# Patient Record
Sex: Female | Born: 1935 | Race: White | Hispanic: No | Marital: Married | Smoking: Never smoker
Health system: Southern US, Community
[De-identification: ages and names within clinical notes are randomized; demographics above are authoritative.]

## PROBLEM LIST (undated history)

## (undated) DIAGNOSIS — Z8719 Personal history of other diseases of the digestive system: Secondary | ICD-10-CM

## (undated) DIAGNOSIS — F3289 Other specified depressive episodes: Secondary | ICD-10-CM

## (undated) DIAGNOSIS — G4700 Insomnia, unspecified: Secondary | ICD-10-CM

## (undated) DIAGNOSIS — M5430 Sciatica, unspecified side: Secondary | ICD-10-CM

## (undated) DIAGNOSIS — R7989 Other specified abnormal findings of blood chemistry: Secondary | ICD-10-CM

## (undated) DIAGNOSIS — M545 Low back pain, unspecified: Secondary | ICD-10-CM

## (undated) DIAGNOSIS — K5900 Constipation, unspecified: Secondary | ICD-10-CM

## (undated) DIAGNOSIS — K5730 Diverticulosis of large intestine without perforation or abscess without bleeding: Secondary | ICD-10-CM

## (undated) DIAGNOSIS — M25549 Pain in joints of unspecified hand: Secondary | ICD-10-CM

## (undated) DIAGNOSIS — N951 Menopausal and female climacteric states: Secondary | ICD-10-CM

## (undated) DIAGNOSIS — R748 Abnormal levels of other serum enzymes: Secondary | ICD-10-CM

## (undated) DIAGNOSIS — M25579 Pain in unspecified ankle and joints of unspecified foot: Secondary | ICD-10-CM

## (undated) DIAGNOSIS — R1319 Other dysphagia: Secondary | ICD-10-CM

## (undated) DIAGNOSIS — M1990 Unspecified osteoarthritis, unspecified site: Secondary | ICD-10-CM

## (undated) DIAGNOSIS — K759 Inflammatory liver disease, unspecified: Secondary | ICD-10-CM

## (undated) DIAGNOSIS — K222 Esophageal obstruction: Secondary | ICD-10-CM

## (undated) DIAGNOSIS — R252 Cramp and spasm: Secondary | ICD-10-CM

## (undated) DIAGNOSIS — M76899 Other specified enthesopathies of unspecified lower limb, excluding foot: Secondary | ICD-10-CM

## (undated) DIAGNOSIS — I4891 Unspecified atrial fibrillation: Secondary | ICD-10-CM

## (undated) DIAGNOSIS — F411 Generalized anxiety disorder: Secondary | ICD-10-CM

## (undated) DIAGNOSIS — R5381 Other malaise: Secondary | ICD-10-CM

## (undated) DIAGNOSIS — Z87442 Personal history of urinary calculi: Secondary | ICD-10-CM

## (undated) DIAGNOSIS — I499 Cardiac arrhythmia, unspecified: Secondary | ICD-10-CM

## (undated) DIAGNOSIS — IMO0001 Reserved for inherently not codable concepts without codable children: Secondary | ICD-10-CM

## (undated) DIAGNOSIS — M79609 Pain in unspecified limb: Secondary | ICD-10-CM

## (undated) DIAGNOSIS — R5383 Other fatigue: Secondary | ICD-10-CM

## (undated) DIAGNOSIS — M722 Plantar fascial fibromatosis: Secondary | ICD-10-CM

## (undated) DIAGNOSIS — M25569 Pain in unspecified knee: Secondary | ICD-10-CM

## (undated) DIAGNOSIS — I10 Essential (primary) hypertension: Secondary | ICD-10-CM

## (undated) DIAGNOSIS — I498 Other specified cardiac arrhythmias: Secondary | ICD-10-CM

## (undated) DIAGNOSIS — G43909 Migraine, unspecified, not intractable, without status migrainosus: Secondary | ICD-10-CM

## (undated) DIAGNOSIS — Z950 Presence of cardiac pacemaker: Secondary | ICD-10-CM

## (undated) DIAGNOSIS — M25559 Pain in unspecified hip: Secondary | ICD-10-CM

## (undated) DIAGNOSIS — K449 Diaphragmatic hernia without obstruction or gangrene: Secondary | ICD-10-CM

## (undated) DIAGNOSIS — M4800 Spinal stenosis, site unspecified: Secondary | ICD-10-CM

## (undated) DIAGNOSIS — E559 Vitamin D deficiency, unspecified: Secondary | ICD-10-CM

## (undated) DIAGNOSIS — E669 Obesity, unspecified: Secondary | ICD-10-CM

## (undated) DIAGNOSIS — J301 Allergic rhinitis due to pollen: Secondary | ICD-10-CM

## (undated) DIAGNOSIS — K2100 Gastro-esophageal reflux disease with esophagitis, without bleeding: Secondary | ICD-10-CM

## (undated) DIAGNOSIS — R209 Unspecified disturbances of skin sensation: Secondary | ICD-10-CM

## (undated) DIAGNOSIS — I8390 Asymptomatic varicose veins of unspecified lower extremity: Secondary | ICD-10-CM

## (undated) DIAGNOSIS — K219 Gastro-esophageal reflux disease without esophagitis: Secondary | ICD-10-CM

## (undated) DIAGNOSIS — G5600 Carpal tunnel syndrome, unspecified upper limb: Secondary | ICD-10-CM

## (undated) DIAGNOSIS — F329 Major depressive disorder, single episode, unspecified: Secondary | ICD-10-CM

## (undated) DIAGNOSIS — G8929 Other chronic pain: Secondary | ICD-10-CM

## (undated) DIAGNOSIS — Z9289 Personal history of other medical treatment: Secondary | ICD-10-CM

## (undated) DIAGNOSIS — R079 Chest pain, unspecified: Secondary | ICD-10-CM

## (undated) HISTORY — DX: Other dysphagia: R13.19

## (undated) HISTORY — DX: Plantar fascial fibromatosis: M72.2

## (undated) HISTORY — PX: JOINT REPLACEMENT: SHX530

## (undated) HISTORY — DX: Inflammatory liver disease, unspecified: K75.9

## (undated) HISTORY — DX: Pain in unspecified hip: M25.559

## (undated) HISTORY — DX: Diverticulosis of large intestine without perforation or abscess without bleeding: K57.30

## (undated) HISTORY — PX: SKIN SURGERY: SHX2413

## (undated) HISTORY — PX: ESOPHAGOGASTRODUODENOSCOPY (EGD) WITH ESOPHAGEAL DILATION: SHX5812

## (undated) HISTORY — DX: Generalized anxiety disorder: F41.1

## (undated) HISTORY — DX: Unspecified disturbances of skin sensation: R20.9

## (undated) HISTORY — DX: Gastro-esophageal reflux disease with esophagitis: K21.0

## (undated) HISTORY — DX: Major depressive disorder, single episode, unspecified: F32.9

## (undated) HISTORY — DX: Pain in unspecified limb: M79.609

## (undated) HISTORY — DX: Other malaise: R53.81

## (undated) HISTORY — PX: EYE SURGERY: SHX253

## (undated) HISTORY — DX: Vitamin D deficiency, unspecified: E55.9

## (undated) HISTORY — DX: Sciatica, unspecified side: M54.30

## (undated) HISTORY — DX: Obesity, unspecified: E66.9

## (undated) HISTORY — DX: Low back pain, unspecified: M54.50

## (undated) HISTORY — DX: Other specified enthesopathies of unspecified lower limb, excluding foot: M76.899

## (undated) HISTORY — DX: Other specified cardiac arrhythmias: I49.8

## (undated) HISTORY — DX: Pain in unspecified ankle and joints of unspecified foot: M25.579

## (undated) HISTORY — DX: Spinal stenosis, site unspecified: M48.00

## (undated) HISTORY — PX: OTHER SURGICAL HISTORY: SHX169

## (undated) HISTORY — DX: Esophageal obstruction: K22.2

## (undated) HISTORY — DX: Other specified depressive episodes: F32.89

## (undated) HISTORY — DX: Essential (primary) hypertension: I10

## (undated) HISTORY — DX: Presence of cardiac pacemaker: Z95.0

## (undated) HISTORY — DX: Carpal tunnel syndrome, unspecified upper limb: G56.00

## (undated) HISTORY — DX: Other fatigue: R53.83

## (undated) HISTORY — DX: Other specified abnormal findings of blood chemistry: R79.89

## (undated) HISTORY — PX: BREAST BIOPSY: SHX20

## (undated) HISTORY — DX: Pain in joints of unspecified hand: M25.549

## (undated) HISTORY — PX: INSERT / REPLACE / REMOVE PACEMAKER: SUR710

## (undated) HISTORY — DX: Abnormal levels of other serum enzymes: R74.8

## (undated) HISTORY — DX: Chest pain, unspecified: R07.9

## (undated) HISTORY — DX: Cramp and spasm: R25.2

## (undated) HISTORY — DX: Reserved for inherently not codable concepts without codable children: IMO0001

## (undated) HISTORY — PX: TRIGGER FINGER RELEASE: SHX641

## (undated) HISTORY — DX: Gastro-esophageal reflux disease without esophagitis: K21.9

## (undated) HISTORY — DX: Menopausal and female climacteric states: N95.1

## (undated) HISTORY — DX: Pain in unspecified knee: M25.569

## (undated) HISTORY — DX: Allergic rhinitis due to pollen: J30.1

## (undated) HISTORY — DX: Low back pain: M54.5

## (undated) HISTORY — DX: Insomnia, unspecified: G47.00

## (undated) HISTORY — DX: Gastro-esophageal reflux disease with esophagitis, without bleeding: K21.00

## (undated) HISTORY — DX: Diaphragmatic hernia without obstruction or gangrene: K44.9

## (undated) HISTORY — DX: Asymptomatic varicose veins of unspecified lower extremity: I83.90

---

## 1977-12-25 HISTORY — PX: VAGINAL HYSTERECTOMY: SUR661

## 1986-12-25 HISTORY — PX: COLONOSCOPY: SHX174

## 1987-12-26 HISTORY — PX: CHOLECYSTECTOMY OPEN: SUR202

## 1998-12-06 ENCOUNTER — Other Ambulatory Visit: Admission: RE | Admit: 1998-12-06 | Discharge: 1998-12-06 | Payer: Self-pay | Admitting: Gynecology

## 1998-12-25 HISTORY — PX: CATARACT EXTRACTION W/ INTRAOCULAR LENS IMPLANT: SHX1309

## 1999-08-03 HISTORY — PX: CATARACT EXTRACTION W/ INTRAOCULAR LENS IMPLANT: SHX1309

## 2000-01-17 ENCOUNTER — Other Ambulatory Visit: Admission: RE | Admit: 2000-01-17 | Discharge: 2000-01-17 | Payer: Self-pay | Admitting: Gynecology

## 2001-01-10 ENCOUNTER — Ambulatory Visit (HOSPITAL_COMMUNITY): Admission: RE | Admit: 2001-01-10 | Discharge: 2001-01-10 | Payer: Self-pay | Admitting: Internal Medicine

## 2001-02-11 ENCOUNTER — Other Ambulatory Visit: Admission: RE | Admit: 2001-02-11 | Discharge: 2001-02-11 | Payer: Self-pay | Admitting: Gynecology

## 2001-12-25 HISTORY — PX: KNEE ARTHROSCOPY: SHX127

## 2002-02-17 ENCOUNTER — Other Ambulatory Visit: Admission: RE | Admit: 2002-02-17 | Discharge: 2002-02-17 | Payer: Self-pay | Admitting: Gynecology

## 2002-11-13 ENCOUNTER — Ambulatory Visit (HOSPITAL_COMMUNITY): Admission: RE | Admit: 2002-11-13 | Discharge: 2002-11-13 | Payer: Self-pay | Admitting: Internal Medicine

## 2002-11-23 ENCOUNTER — Emergency Department (HOSPITAL_COMMUNITY): Admission: EM | Admit: 2002-11-23 | Discharge: 2002-11-23 | Payer: Self-pay | Admitting: Emergency Medicine

## 2002-11-23 ENCOUNTER — Encounter: Payer: Self-pay | Admitting: Emergency Medicine

## 2003-02-06 ENCOUNTER — Encounter: Payer: Self-pay | Admitting: Gastroenterology

## 2003-03-05 ENCOUNTER — Other Ambulatory Visit: Admission: RE | Admit: 2003-03-05 | Discharge: 2003-03-05 | Payer: Self-pay | Admitting: Gynecology

## 2003-06-11 ENCOUNTER — Ambulatory Visit (HOSPITAL_COMMUNITY): Admission: RE | Admit: 2003-06-11 | Discharge: 2003-06-11 | Payer: Self-pay | Admitting: Gastroenterology

## 2003-06-11 ENCOUNTER — Encounter: Payer: Self-pay | Admitting: Gastroenterology

## 2003-08-20 ENCOUNTER — Ambulatory Visit (HOSPITAL_COMMUNITY): Admission: RE | Admit: 2003-08-20 | Discharge: 2003-08-20 | Payer: Self-pay | Admitting: Internal Medicine

## 2004-04-24 HISTORY — PX: TOTAL KNEE ARTHROPLASTY: SHX125

## 2004-05-16 ENCOUNTER — Inpatient Hospital Stay (HOSPITAL_COMMUNITY): Admission: RE | Admit: 2004-05-16 | Discharge: 2004-05-19 | Payer: Self-pay | Admitting: Orthopedic Surgery

## 2005-07-03 ENCOUNTER — Other Ambulatory Visit: Admission: RE | Admit: 2005-07-03 | Discharge: 2005-07-03 | Payer: Self-pay | Admitting: Gynecology

## 2005-10-03 ENCOUNTER — Ambulatory Visit: Payer: Self-pay | Admitting: Gastroenterology

## 2006-03-13 ENCOUNTER — Ambulatory Visit: Payer: Self-pay | Admitting: Sports Medicine

## 2006-03-13 ENCOUNTER — Encounter: Admission: RE | Admit: 2006-03-13 | Discharge: 2006-03-13 | Payer: Self-pay | Admitting: Sports Medicine

## 2006-03-25 HISTORY — PX: OTHER SURGICAL HISTORY: SHX169

## 2006-04-10 ENCOUNTER — Ambulatory Visit: Payer: Self-pay | Admitting: Sports Medicine

## 2006-05-11 ENCOUNTER — Ambulatory Visit: Payer: Self-pay | Admitting: Sports Medicine

## 2006-07-12 ENCOUNTER — Other Ambulatory Visit: Admission: RE | Admit: 2006-07-12 | Discharge: 2006-07-12 | Payer: Self-pay | Admitting: Gynecology

## 2006-08-28 ENCOUNTER — Ambulatory Visit: Payer: Self-pay | Admitting: Gastroenterology

## 2006-10-05 ENCOUNTER — Ambulatory Visit: Payer: Self-pay | Admitting: Gastroenterology

## 2007-03-27 ENCOUNTER — Ambulatory Visit: Payer: Self-pay | Admitting: Sports Medicine

## 2007-03-27 DIAGNOSIS — M25559 Pain in unspecified hip: Secondary | ICD-10-CM | POA: Insufficient documentation

## 2007-03-27 DIAGNOSIS — M76899 Other specified enthesopathies of unspecified lower limb, excluding foot: Secondary | ICD-10-CM | POA: Insufficient documentation

## 2007-05-01 ENCOUNTER — Ambulatory Visit: Payer: Self-pay | Admitting: Sports Medicine

## 2007-05-01 DIAGNOSIS — M79609 Pain in unspecified limb: Secondary | ICD-10-CM | POA: Insufficient documentation

## 2007-05-01 DIAGNOSIS — M549 Dorsalgia, unspecified: Secondary | ICD-10-CM | POA: Insufficient documentation

## 2007-06-04 ENCOUNTER — Ambulatory Visit: Payer: Self-pay | Admitting: Sports Medicine

## 2007-06-04 DIAGNOSIS — R030 Elevated blood-pressure reading, without diagnosis of hypertension: Secondary | ICD-10-CM | POA: Insufficient documentation

## 2007-07-31 ENCOUNTER — Other Ambulatory Visit: Admission: RE | Admit: 2007-07-31 | Discharge: 2007-07-31 | Payer: Self-pay | Admitting: Gynecology

## 2008-06-16 ENCOUNTER — Telehealth: Payer: Self-pay | Admitting: Gastroenterology

## 2008-09-07 ENCOUNTER — Other Ambulatory Visit: Admission: RE | Admit: 2008-09-07 | Discharge: 2008-09-07 | Payer: Self-pay | Admitting: Gynecology

## 2009-06-02 DIAGNOSIS — R1314 Dysphagia, pharyngoesophageal phase: Secondary | ICD-10-CM | POA: Insufficient documentation

## 2009-06-02 DIAGNOSIS — K219 Gastro-esophageal reflux disease without esophagitis: Secondary | ICD-10-CM | POA: Insufficient documentation

## 2009-06-02 DIAGNOSIS — K449 Diaphragmatic hernia without obstruction or gangrene: Secondary | ICD-10-CM | POA: Insufficient documentation

## 2009-06-02 DIAGNOSIS — F411 Generalized anxiety disorder: Secondary | ICD-10-CM

## 2009-06-02 DIAGNOSIS — I10 Essential (primary) hypertension: Secondary | ICD-10-CM | POA: Insufficient documentation

## 2009-06-02 DIAGNOSIS — K5730 Diverticulosis of large intestine without perforation or abscess without bleeding: Secondary | ICD-10-CM | POA: Insufficient documentation

## 2009-06-02 DIAGNOSIS — K222 Esophageal obstruction: Secondary | ICD-10-CM | POA: Insufficient documentation

## 2009-06-02 DIAGNOSIS — E7800 Pure hypercholesterolemia, unspecified: Secondary | ICD-10-CM | POA: Insufficient documentation

## 2009-06-03 ENCOUNTER — Ambulatory Visit: Payer: Self-pay | Admitting: Gastroenterology

## 2009-06-03 DIAGNOSIS — A09 Infectious gastroenteritis and colitis, unspecified: Secondary | ICD-10-CM | POA: Insufficient documentation

## 2010-11-08 ENCOUNTER — Encounter: Admission: RE | Admit: 2010-11-08 | Discharge: 2010-11-08 | Payer: Self-pay | Admitting: Specialist

## 2011-05-12 NOTE — Op Note (Signed)
NAME:  [name], [name]                          ACCOUNT NO.:  [id number]   MEDICAL RECORD NO.:  [id number]                   PATIENT TYPE:  INP   LOCATION:  X002                                 FACILITY:  [facility]   PHYSICIAN:  John L. Rendall III, M.D.           DATE OF BIRTH:  1931/11/16   DATE OF PROCEDURE:  05/16/2004  DATE OF DISCHARGE:                                 OPERATIVE REPORT   PREOPERATIVE DIAGNOSIS:  Osteoarthritis, left knee.   OPERATION/PROCEDURE:  Left LCS total knee replacement, fully cemented.   POSTOPERATIVE DIAGNOSIS:  Osteoarthritis, left knee.   SURGEON:  John L. Rendall, M.D.   ASSISTANT:  Richardean Canal, P.A.-C.   ANESTHESIA:  General.   PATHOLOGY:  The patient has worn out knee with loose bodies in the posterior  recesses, near bone against bone medially and laterally and under the  patella.   DESCRIPTION OF PROCEDURE:  Under general anesthesia, the left leg is  prepared with DuraPrep and draped as a sterile field.  A sterile tourniquet  is placed on the proximal thigh, leg is wrapped out with the Esmarch and the  tourniquet is used to 350 mmHg.   Midline incision is made.  The patella is everted.  The femur is sized to  the standard plus.  Using the first tibial guide, a proximal tibial  resection is carried out.  Using the first femoral guide, an intercondylar  drill hole is made.  Using the second guide, the anterior and posterior  flare of the distal femur are resected with a 10 mm flexion guide.  Intramedullary guide is then used for a distal femoral cut and after several  tries, the cut was correct, correcting the preoperative flexion gap with 10  degrees.  Once this was completed, the recessing guide was used.  With the  femur prepared, attention was turned to the tibia.  It was sized to a #3.  A  center peg hole with keel was placed.  Trial reduction of a #3 tibia, #10  bearing and standard plus femur with a standard plus patella revealed  excellent fit, alignment and stability.  Patella was osteotomized and three  peg holes done without difficulty.  At this point, all bony surfaces were  prepared with pulse irrigation.  The components were cemented in place with  care taken to firmly finger pack into the tibia and femur as the bone was  very osteoporotic.  For example, pins were driven in by hand, not requiring  a mallet.  Once all cement had hardened, the tourniquet was let down at 49  minutes.  The knee was then closed in layers with #1 Dacron, 2-0 Vicryl and  skin clips.  Operative time approximately 1 hour and 10 minutes.  Blood loss  less than 100 mL.  The patient tolerated the procedure well and returned to  recovery in good condition.  John L. Dorothyann Gibbs, M.D.    Renato Gails  D:  05/16/2004  T:  05/16/2004  Job:  [id number]

## 2011-05-12 NOTE — Discharge Summary (Signed)
NAME:  [name], TAPPER                          ACCOUNT NO.:  [id number]   MEDICAL RECORD NO.:  [id number]                   PATIENT TYPE:  INP   LOCATION:  0472                                 FACILITY:  [facility]   PHYSICIAN:  John L. Rendall III, M.D.           DATE OF BIRTH:  1931/08/16   DATE OF ADMISSION:  05/16/2004  DATE OF DISCHARGE:  05/19/2004                                 DISCHARGE SUMMARY   ADMITTING DIAGNOSES:  1. Left knee osteoarthritis, medial and patellofemoral compartments.  2. Dry eyes.  3. Hypertension.  4. Peptic ulcer disease.  5. Obesity.   DISCHARGE DIAGNOSES:  1. Status post left LCS total knee acute blood loss anemia with transfusion     of 1 unit of packed red blood cells.  2. Hypokalemia, resolved.  3. Hypotension, resolved.  4. Dry eyes.  5. Hypertension.  6. Peptic ulcer disease.  7. Obesity.   HISTORY OF PRESENT ILLNESS:  [name] is a 77 year old female with a  several year history of bilateral knee pain.  Left knee pain worse than  right.  The patient had a left knee scope in 2003 with minimal improvement.  She has had progressively worsening pain since that time.  She describes the  pain as aching pain with radiation into the calf.  Denies any mechanical  symptoms.  Has night pain.  Also notes leg weakness.  X-rays of the left  knee revealed medial and patellofemoral compartment osteoarthritis.   ALLERGIES:  1. REGLAN.  2. ANTIHISTAMINES.   MEDICATIONS:  1. Lumigan one drop OU q.h.s.  2. Restasis one drop OU b.i.d.  3. Hydrochlorothiazide 25 mg p.o. daily.  4. Flonase 0.05% p.r.n.  5. Triazene 0.075, half a tablet p.r.n.  6. Tylenol arthritis p.r.n.  7. Pepcid complete p.r.n.  8. Bion tears p.r.n.  9. Gentel gel p.r.n.   SURGICAL PROCEDURE:  The patient was taken to the operating room by Dr. Erasmo Leventhal on May 16, 2004, assisted by Richardean Canal, P.A.C.  The patient was  placed under general anesthesia and a left LCS total knee  was performed.  The following components were used - size 3 tibia, #10 bearing, standard  plus femur with a standard plus patella.  EBL was 100 ml.  The patient  returned to recovery in good stable condition.   CONSULTS:  The following consults were obtained while the patient was  hospitalized -  1. PT.  2. OT.  3. Case management.   HOSPITAL COURSE:  The patient did develop some dizziness and lightheadedness  on postoperative day #1.  Hemoglobin on postoperative day #1 was 9.2, and  blood pressure was 98/46.  The patient, therefore, was transfused with 1  unit of packed red blood cells.  The patient also was found on postoperative  day #1 to be hypokalemia, and the potassium was replaced.  Intraoperatively,  the patient's bone was very soft,  and, therefore, it was recommended that  the patient have a bone density scan as an outpatient, and will be started  on medication for osteoporosis postoperatively.   On postoperative day #2, the patient's hemoglobin was 9.8, hematocrit 29.2.  Hypotension resolved.  On postoperative day #3, the patient's vital signs  were stable.  Hypotension resolved.  Hypokalemia was also resolved at that  time.  The patient had progressed well with physical therapy, and was  discharged to home in good stable condition.   LABORATORY DATA:  The following labs were obtained preoperatively.  CBC  which was within normal limits.  Coags within normal limits.  Routine  chemistries on admission were all within normal limits.  On May 19, 2004,  hemoglobin was 9.2, hematocrit 27.6.  On May 19, 2004, potassium was 3.4.  Hepatic enzymes on admission were all within normal limits.  Urinalysis on  admission was negative.   EKG on admission dated May 09, 2004 showed normal sinus rhythm with a heart  rate of 61 beats per minute, PR interval of 150 ms., PRT axis 72, -8, and  104.   DISCHARGE INSTRUCTIONS:   MEDICATIONS:  1. The patient will resume preoperative  medications.  2. Arixtra 2.5 mg subcutaneous injections daily x4 days.  3. OxyContin 10 mg sustained release, take one tablet q.12h., #20, with no     refills.  4. Percocet 5/325, 1-2 tablets q.4-6h. as needed for breakthrough pain, #60.   ACTIVITY:  Weightbearing as tolerated.   DIET:  No restrictions.   WOUND CARE:  The patient is to keep the wound clean and dry.  Change the  dressing daily.  Call the office for any signs of infection.   FOLLOW UP:  The patient needs follow up with Dr. Priscille Kluver in the office in 10-  12 days after surgery.  The patient is to call the office at [phone] for an  appointment.   DISPOSITION:  The patient was discharged home in good stable condition.     Richardean Canal, P.A.                       John L. Dorothyann Gibbs, M.D.    GC/MEDQ  D:  06/06/2004  T:  06/06/2004  Job:  [id number]

## 2011-05-12 NOTE — H&P (Signed)
NAME:  [name], [name]                          ACCOUNT NO.:  [id number]   MEDICAL RECORD NO.:  [id number]                   PATIENT TYPE:  INP   LOCATION:  NA                                   FACILITY:  [facility]   PHYSICIAN:  John L. Rendall, M.D.               DATE OF BIRTH:  1931-02-02   DATE OF ADMISSION:  05/16/2004  DATE OF DISCHARGE:                                HISTORY & PHYSICAL   CHIEF COMPLAINT:  Left knee pain.   HISTORY OF PRESENT ILLNESS:  The patient is a 77 year old white female with  several-year history of left knee pain.  The patient had an arthroscopic  evaluation in 2003 with little improvement but continued to have  progressively worsening of her pain over the last several years, but the  last six months has significantly worsened.  She describes the pain as  mostly across the anterior part of the knee with general weakness in the  knee.  It does give way at times. She does have significant night pain. She  describes the pain as an achy sensation which does radiate down into the  calf region.  The patient denies any mechanical symptoms and no specific  injuries.   ALLERGIES:  REGLAN, ANTIHISTAMINES.   CURRENT MEDICATIONS:  1. Lumigan 2.5 mg 1 drop OU q.h.s.  2. Restasis 0.05% 1 drop b.i.d. OU.  3. Hydrochlorothiazide 25 mg p.o. daily.  4. Flonase 0.5% p.r.n.  5. Tranxene 0.035 mg p.o. daily.  6. Tylenol Arthritis p.r.n.  7. Pepcid Complete p.r.n.  8. Vion Tears p.r.n.  9. Gentile Gel p.r.n.   PAST MEDICAL HISTORY:  1. Hypertension.  2. Peptic ulcer disease.  3. Obesity.   PAST SURGICAL HISTORY:  1. Cholecystectomy.  2. Breast biopsy.  3. Arthroscopy, right knee.  4. Arthroscopy, left knee.  5. Hysterectomy.   The patient denies any complications with the above-mentioned surgical  procedures.   SOCIAL HISTORY:  GENERAL:  The patient is a 77 year old white female,  slightly obese.  Denies any history of smoking or alcohol use.  She is  married,  lives with her husband in a two-story house.  She is a retired  Secondary school teacher.   PHYSICIANS:  Family physician: Lenon Curt. Chilton Si, M.D.  Gastroenterologist:  Everardo All. Madilyn Fireman, M.D.   FAMILY MEDICAL HISTORY:  Mother is deceased from a stroke at 18; father is  deceased from heart disease at 17.  The patient has one brother with history  of CVA and currently committed in a mental institution.   REVIEW OF SYSTEMS:  Positive for glasses with reading.  She does have  shortness of breath with exertion which she relates to weight.  She denies  any chest discomfort or diaphoresis.  The patient does have some reflux.  She does have a history of ulcers.  She also has frequent constipation.  Otherwise, all other categories are  negative.   PHYSICAL EXAMINATION:  VITAL SIGNS:  Height 5 feet 4 inches, weight 208  pounds, blood pressure 138/76, pulse 80 and regular, respirations 12.  The  patient is afebrile.  GENERAL:  This is a slightly heavyset white female.  She ambulates very  slowly with support from her husband.  She is able to get on and off the  exam table with assistance.  HEENT:  Head was normocephalic.  Pupils equal, round, and reactive.  Extraocular movements intact.  External ears without deformities. Gross  hearing is intact.  Nasal septum is midline.  Oral buccal mucosa is pink and  moist.  NECK:  Supple.  No palpable adenopathy.  Thyroid region was nontender.  She  had full range of motion of her neck without any difficulty or tenderness.  CHEST:  Lung sounds were very distant but clear and equal.  No rales,  rhonchi, or rubs.  HEART:  Regular rate and rhythm.  No murmurs, rubs, or gallops.  ABDOMEN:  Round, obese, soft and nontender.  Bowel sounds normoactive  throughout.  No hepatosplenomegaly.  EXTREMITIES:  Upper extremities:  Shoulders, elbows and wrists have full  range of motion.  Motor strength is 5/5.  Lower extremities:  Right and left  hip had full  extension and flexion up to 120 degrees.  No mechanical  symptoms with internal or external rotation.  Bilateral knees were very  tender and sore, round and boggy appearing.  Right knee had very brisk full  extension, flexion back to 115 degrees.  She had very slight valgus varus  laxity.  She was very tender across the anterior part of the knees but no  palpable effusions.  She has coarse crepitus.  Left knee:  Round, boggy  appearing.  She was tender across the anterior part of the knee.  She had  about 5 degrees valgus varus laxity with stressing.  She was able to  actively extend to about 10 degrees, passive extension to 0, flexion back to  100 degrees.  Calves were nontender.  Ankles were symmetric with good  dorsiflexion and plantar flexion.  PERIPHERAL VASCULAR:  Carotid pulses 2+, no bruits; radial pulses 2+;  dorsalis pedis and posterior tibial pulses 1+.  She had no lower extremity  edema or venous stasis changes.  NEUROLOGIC:  The patient was conscious, alert, and appropriate, held easy  conversation with examiner.  Cranial nerves II-XII grossly intact.  No gross  neurologic defects noted.  She was grossly intact to light touch sensation  from head to toe.  BREASTS/RECTAL/GU: Exams were deferred at this time.   IMPRESSION:  1. End-stage osteoarthritis bilateral knees, currently right more     symptomatic than left.  2. Hypertension.  3. Peptic ulcer disease.  4. Obesity.  5. Chronic eye syndrome.   PLAN:  The patient will undergo all routine labs and tests prior to having a  right total knee arthroplasty by Dr. Priscille Kluver at [facility] on May  23.      Jamelle Rushing, P.A.                      John L. Priscille Kluver, M.D.    RWK/MEDQ  D:  05/09/2004  T:  05/09/2004  Job:  [id number]

## 2013-04-04 ENCOUNTER — Telehealth: Payer: Self-pay

## 2013-04-04 ENCOUNTER — Other Ambulatory Visit: Payer: Self-pay | Admitting: Internal Medicine

## 2013-04-04 NOTE — Telephone Encounter (Signed)
[name] for his wife Shavonda, she had right knee orthoscopy done yesterday. Her pulse dropped to 29, they gave her medication and got it up to 51. After the surgery they told her she needs to see a Cardiologist and contact her PCP. [name] was asking if she needs to see one and if so who would Dr. Chilton Si recommend. I review her medication, she is on 3 medications for her glaucoma. I explain to [name] that he needs to call her Ophthalmologist today and let him know about this, he may change her glaucoma medication because of the low pulse rate. I will contact Dr. Chilton Si about the referral and will call him back.

## 2013-04-05 NOTE — Telephone Encounter (Signed)
I would suggest Dr. Jacinto Halim or Dr Shirlee Latch for a cardiologist.

## 2013-04-07 NOTE — Telephone Encounter (Signed)
Spoke with [name], they want to be referred to Dr Shirlee Latch. Her pulse today 41. She has put in a call to Dr. Randon Goldsmith office. Talked with Dr. Chilton Si he doesn't want to change her medications at this time. Will call Dr. Alford Highland office tomorrow.

## 2013-04-08 ENCOUNTER — Telehealth: Payer: Self-pay

## 2013-04-08 NOTE — Telephone Encounter (Signed)
Called Dr. Alford Highland office made appt for [facility] May 19th at 12:15. Spoke with Mr. Reierson gave him the information. They will be on vacation in [place]. He will call and re-schedule the appt. Dr. Hazle Quant did stopped the Timolol. Kaylyn Layer, CMA

## 2013-04-08 NOTE — Telephone Encounter (Signed)
Called Dr. Alford Highland office appt 05/12/13 at 12:15. Spoke with Mr. Adsit gave him the information, they will be on vacation in [place]. He will call Dr. Alford Highland office and reschedule the appt. Dr. Hazle Quant stopped the Timolol. Kaylyn Layer, CMA

## 2013-04-25 ENCOUNTER — Encounter: Payer: Self-pay | Admitting: Internal Medicine

## 2013-05-01 ENCOUNTER — Encounter: Payer: Self-pay | Admitting: *Deleted

## 2013-05-01 ENCOUNTER — Other Ambulatory Visit: Payer: Self-pay | Admitting: *Deleted

## 2013-05-01 DIAGNOSIS — E039 Hypothyroidism, unspecified: Secondary | ICD-10-CM

## 2013-05-01 DIAGNOSIS — R7989 Other specified abnormal findings of blood chemistry: Secondary | ICD-10-CM

## 2013-05-01 DIAGNOSIS — I10 Essential (primary) hypertension: Secondary | ICD-10-CM

## 2013-05-12 ENCOUNTER — Telehealth: Payer: Self-pay | Admitting: *Deleted

## 2013-05-12 ENCOUNTER — Encounter: Payer: Self-pay | Admitting: Cardiology

## 2013-05-12 ENCOUNTER — Ambulatory Visit (INDEPENDENT_AMBULATORY_CARE_PROVIDER_SITE_OTHER): Payer: Self-pay | Admitting: Cardiology

## 2013-05-12 ENCOUNTER — Encounter (INDEPENDENT_AMBULATORY_CARE_PROVIDER_SITE_OTHER): Payer: Medicare Other

## 2013-05-12 VITALS — BP 132/78 | HR 65 | Ht 63.0 in | Wt 192.0 lb

## 2013-05-12 DIAGNOSIS — I498 Other specified cardiac arrhythmias: Secondary | ICD-10-CM

## 2013-05-12 DIAGNOSIS — R0602 Shortness of breath: Secondary | ICD-10-CM

## 2013-05-12 DIAGNOSIS — R001 Bradycardia, unspecified: Secondary | ICD-10-CM

## 2013-05-12 NOTE — Patient Instructions (Addendum)
Start aspirin 81mg  daily.   Your physician has recommended that you wear a holter monitor. Holter monitors are medical devices that record the heart's electrical activity. Doctors most often use these monitors to diagnose arrhythmias. Arrhythmias are problems with the speed or rhythm of the heartbeat. The monitor is a small, portable device. You can wear one while you do your normal daily activities. This is usually used to diagnose what is causing palpitations/syncope (passing out).  Your physician has requested that you have an echocardiogram. Echocardiography is a painless test that uses sound waves to create images of your heart. It provides your doctor with information about the size and shape of your heart and how well your heart's chambers and valves are working. This procedure takes approximately one hour. There are no restrictions for this procedure.  Your physician wants you to follow-up in: 1 year with Dr Shirlee Latch. (May 2015). You will receive a reminder letter in the mail two months in advance. If you don't receive a letter, please call our office to schedule the follow-up appointment.

## 2013-05-12 NOTE — Telephone Encounter (Signed)
48 Hr holter monitor placed on Pt 05/12/13 TK

## 2013-05-13 DIAGNOSIS — R001 Bradycardia, unspecified: Secondary | ICD-10-CM | POA: Insufficient documentation

## 2013-05-13 NOTE — Progress Notes (Signed)
Patient ID: [name], female   DOB: 1931/07/06, 79 y.o.   MRN: [id number] PCP: Dr. Murray Hodgkins  79 yo presents for evaluation of bradycardia.  In 4/14, patient went to a surgical center to have right knee arthroscopy.  Prior to surgery, her heart rate was as low as 29.  She does say that she was in a lot of pain that day (in her knee) and was anxious.  No ECG or telemetry strips available.  She denies lightheadedness, dyspnea, or chest pain.  She says that the anesthesiologist gave her something to raise her heart rate and she had the arthroscopy done (? Atropine).  She was told after this that she ought to stop timolol eye drops (she did).  She does remember that about 20 years ago when she went in for a breast biopsy, she was told that her HR was very low.  She does have a history of mild bradycardia, with HR in the 50s chronically.  Today, it is actually 58 (she is off timolol).    In general, no history of lightheadedness or syncope.  No exertional dyspnea or chest pain.  Main limitation is right knee pain.  She needs to have right TKR but her surgeon wanted cardiac evaluation prior given bradycardia history.  ECG: NSR at 57, nonspecific T wave flattening.   PMH: 1. Glaucoma 2. GERD 3. Osteoarthritis 4. HTN 5. Hyperlipidemia 6. Diverticulosis 7. Low back pain 8. H/o cholecystectomy 9. H/o hysterectomy 10. TKR 2005 (left) 11. Sinus bradycardia  SH: Retired, lives in [place], married with 1 daughter.   FH: Father with MI at 83, mother with CVA, brother with CVA  ROS: All systems reviewed and negative except as per HPI.   Current Outpatient Prescriptions  Medication Sig Dispense Refill  . acetaminophen-codeine (TYLENOL #3) 300-30 MG per tablet Take 1 to 2 tablets every 8 hours as needed for pain      . ALPRAZolam (XANAX) 0.25 MG tablet Take 0.25 mg by mouth at bedtime as needed for sleep.      Marland Kitchen atorvastatin (LIPITOR) 10 MG tablet Take 10 mg by mouth daily.      Marland Kitchen azelastine  (OPTIVAR) 0.05 % ophthalmic solution 1 drop 2 (two) times daily.      . bimatoprost (LUMIGAN) 0.01 % SOLN Instill 1 drop into both eyes once daily in the evening      . chloral hydrate (SOMNOTE) 500 MG capsule Take 2 capsules twice daily      . diclofenac sodium (VOLTAREN) 1 % GEL Apply 10 g topically.      . dorzolamide (TRUSOPT) 2 % ophthalmic solution Instill 1 drop in each eye twice a day      . dorzolamide (TRUSOPT) 2 % ophthalmic solution Instill 1 drop in each eye twice a day      . HYDROcodone-acetaminophen (NORCO/VICODIN) 5-325 MG per tablet Take 1 tablet by mouth every 4 hours as needed for pain      . Ketoprofen POWD APPLY 2 TO 4 TIMES A DAY FOR ARTHRITIC OR MUSCLE PAIN.  120 g  5  . methylcellulose (ARTIFICIAL TEARS) 1 % ophthalmic solution 1 drop as needed.      Marland Kitchen omeprazole (PRILOSEC) 40 MG capsule Take 40 mg by mouth daily.      . traMADol (ULTRAM) 50 MG tablet Take 1 or 2 tablets every 6 to 8 hours as needed for pain      . valsartan-hydrochlorothiazide (DIOVAN-HCT) 320-25 MG per tablet Take 1  tablet by mouth daily.      Marland Kitchen zolpidem (AMBIEN) 10 MG tablet Take 10 mg by mouth at bedtime as needed for sleep.      Marland Kitchen aspirin EC 81 MG tablet Take 1 tablet (81 mg total) by mouth daily.       No current facility-administered medications for this visit.    BP 132/78  Pulse 65  Ht 5\' 3"  (1.6 m)  Wt 192 lb (87.091 kg)  BMI 34.02 kg/m2  SpO2 97% General: NAD Neck: No JVD, no thyromegaly or thyroid nodule.  Lungs: Clear to auscultation bilaterally with normal respiratory effort. CV: Nondisplaced PMI.  Heart regular S1/S2, no S3/S4, no murmur.  No peripheral edema.  No carotid bruit.  Normal pedal pulses.  Abdomen: Soft, nontender, no hepatosplenomegaly, no distention.  Skin: Intact without lesions or rashes.  Neurologic: Alert and oriented x 3.  Psych: Normal affect. Extremities: No clubbing or cyanosis.  HEENT: Normal.   Assessment/Plan: 79 yo presents for evaluation of  bradycardia.  Patient has a long history of mild, asymptomatic sinus bradycardia.  Her HR actually seems to be higher recently after stopping timolol.  It is quite possible that the combination of pain and anxiety around the time of her arthroscopy led to a vagal event that was manifested as the bradycardia noted pre-operatively.  - I will arrange for a 48 hour holter to make sure that she does not have any long pauses, etc.  - I will arrange for an echocardiogram prior to right TKR.   - I do not think that she needs a stress test prior to right TKR (no exertional symptoms other than knee pain).   - Given her strong family history of CVA, I think that it would be reasonable for her to take ASA 81 mg daily.   Marca Ancona 05/13/2013

## 2013-05-14 NOTE — Addendum Note (Signed)
Addended by: Micki Riley C on: 05/14/2013 01:43 PM   Modules accepted: Orders

## 2013-05-21 ENCOUNTER — Ambulatory Visit (HOSPITAL_COMMUNITY): Payer: Medicare Other | Attending: Cardiology | Admitting: Radiology

## 2013-05-21 DIAGNOSIS — R0602 Shortness of breath: Secondary | ICD-10-CM | POA: Insufficient documentation

## 2013-05-21 DIAGNOSIS — R001 Bradycardia, unspecified: Secondary | ICD-10-CM

## 2013-05-21 DIAGNOSIS — I379 Nonrheumatic pulmonary valve disorder, unspecified: Secondary | ICD-10-CM | POA: Insufficient documentation

## 2013-05-21 DIAGNOSIS — I517 Cardiomegaly: Secondary | ICD-10-CM | POA: Insufficient documentation

## 2013-05-21 DIAGNOSIS — I359 Nonrheumatic aortic valve disorder, unspecified: Secondary | ICD-10-CM | POA: Insufficient documentation

## 2013-05-21 DIAGNOSIS — I079 Rheumatic tricuspid valve disease, unspecified: Secondary | ICD-10-CM | POA: Insufficient documentation

## 2013-05-21 NOTE — Progress Notes (Signed)
Echocardiogram performed.  

## 2013-05-29 ENCOUNTER — Telehealth: Payer: Self-pay | Admitting: *Deleted

## 2013-05-29 NOTE — Telephone Encounter (Signed)
05/29/13 Dr Shirlee Latch reviewed monitor done 05/12/13. 15 beat run SVT, 9 beat run SVT. No pauses. Mild sinus bradycardia. As SVT is short , would not treat with nodal blockade given prior bradycardia. Pt notified.

## 2013-06-10 ENCOUNTER — Other Ambulatory Visit (HOSPITAL_COMMUNITY): Payer: Self-pay | Admitting: Orthopaedic Surgery

## 2013-06-20 ENCOUNTER — Encounter: Payer: Self-pay | Admitting: *Deleted

## 2013-06-23 ENCOUNTER — Encounter (HOSPITAL_COMMUNITY): Payer: Self-pay | Admitting: Pharmacy Technician

## 2013-06-23 ENCOUNTER — Other Ambulatory Visit: Payer: Self-pay | Admitting: Internal Medicine

## 2013-06-25 ENCOUNTER — Other Ambulatory Visit: Payer: Self-pay

## 2013-06-26 ENCOUNTER — Encounter (HOSPITAL_COMMUNITY)
Admission: RE | Admit: 2013-06-26 | Discharge: 2013-06-26 | Disposition: A | Discharge status: Home / Self Care | Payer: Medicare Other | Source: Ambulatory Visit | Attending: Orthopaedic Surgery | Admitting: Orthopaedic Surgery

## 2013-06-26 ENCOUNTER — Encounter (HOSPITAL_COMMUNITY): Payer: Self-pay

## 2013-06-26 ENCOUNTER — Ambulatory Visit (HOSPITAL_COMMUNITY)
Admission: RE | Admit: 2013-06-26 | Discharge: 2013-06-26 | Disposition: A | Discharge status: Home / Self Care | Payer: Medicare Other | Source: Ambulatory Visit | Attending: Orthopaedic Surgery | Admitting: Orthopaedic Surgery

## 2013-06-26 DIAGNOSIS — M1710 Unilateral primary osteoarthritis, unspecified knee: Secondary | ICD-10-CM | POA: Insufficient documentation

## 2013-06-26 DIAGNOSIS — Z01818 Encounter for other preprocedural examination: Secondary | ICD-10-CM | POA: Insufficient documentation

## 2013-06-26 DIAGNOSIS — IMO0002 Reserved for concepts with insufficient information to code with codable children: Secondary | ICD-10-CM | POA: Insufficient documentation

## 2013-06-26 DIAGNOSIS — M5380 Other specified dorsopathies, site unspecified: Secondary | ICD-10-CM | POA: Insufficient documentation

## 2013-06-26 DIAGNOSIS — I10 Essential (primary) hypertension: Secondary | ICD-10-CM | POA: Insufficient documentation

## 2013-06-26 DIAGNOSIS — R059 Cough, unspecified: Secondary | ICD-10-CM | POA: Insufficient documentation

## 2013-06-26 DIAGNOSIS — R05 Cough: Secondary | ICD-10-CM | POA: Insufficient documentation

## 2013-06-26 HISTORY — PX: KNEE ARTHROSCOPY: SUR90

## 2013-06-26 LAB — CBC
Hemoglobin: 12.1 g/dL (ref 12.0–15.0)
MCH: 29.7 pg (ref 26.0–34.0)
RBC: 4.07 MIL/uL (ref 3.87–5.11)

## 2013-06-26 LAB — BASIC METABOLIC PANEL
CO2: 31 mEq/L (ref 19–32)
GFR calc non Af Amer: 79 mL/min — ABNORMAL LOW (ref >90)
Glucose, Bld: 94 mg/dL (ref 70–99)
Potassium: 3.8 mEq/L (ref 3.5–5.1)
Sodium: 138 mEq/L (ref 135–145)

## 2013-06-26 LAB — URINALYSIS, ROUTINE W REFLEX MICROSCOPIC
Glucose, UA: NEGATIVE mg/dL
Ketones, ur: NEGATIVE mg/dL
Leukocytes, UA: NEGATIVE
Specific Gravity, Urine: 1.016 (ref 1.005–1.030)
pH: 5 (ref 5.0–8.0)

## 2013-06-26 LAB — SURGICAL PCR SCREEN
MRSA, PCR: NEGATIVE
Staphylococcus aureus: NEGATIVE

## 2013-06-26 NOTE — Pre-Procedure Instructions (Signed)
06-26-13 EKG 5'14, CXR done today.

## 2013-06-26 NOTE — Patient Instructions (Addendum)
20 [name]  06/26/2013   Your procedure is scheduled on: 7-11  -2014  Report to [facility] at    0530    AM.  Call this number if you have problems the morning of surgery: [phone]  Or Presurgical Testing [phone](Saadia Dewitt)     Do not eat food:After Midnight.    Take these medicines the morning of surgery with A SIP OF WATER: Lipitor. Omeprazole(reflux).   Use and bring eye drops. Stop Aspirin, aleve, ibuprofen. Stop all herbal, over the counter vitamins.  Do not wear jewelry, make-up or nail polish.  Do not wear lotions, powders, or perfumes. You may wear deodorant.  Do not shave 12 hours prior to first CHG shower(legs and under arms).(face and neck okay.)  Do not bring valuables to the hospital.  Contacts, dentures or bridgework,body piercing,  may not be worn into surgery.  Leave suitcase in the car. After surgery it may be brought to your room.  For patients admitted to the hospital, checkout time is 11:00 AM the day of discharge.   Patients discharged the day of surgery will not be allowed to drive home. Must have responsible person with you x 24 hours once discharged.  Name and phone number of your driver: Jeni Duling, spouse [phone] cell  Special Instructions: CHG(Chlorhedine 4%-"Hibiclens","Betasept","Aplicare") Shower Use Special Wash: see special instructions.(avoid face and genitals)   Please read over the following fact sheets that you were given: MRSA Information, Blood Transfusion fact sheet, Incentive Spirometry Instruction.    Failure to follow these instructions may result in Cancellation of your surgery.   Patient signature_______________________________________________________

## 2013-06-30 ENCOUNTER — Encounter: Payer: Self-pay | Admitting: *Deleted

## 2013-06-30 ENCOUNTER — Other Ambulatory Visit: Payer: Self-pay | Admitting: *Deleted

## 2013-07-01 ENCOUNTER — Ambulatory Visit (INDEPENDENT_AMBULATORY_CARE_PROVIDER_SITE_OTHER): Payer: Medicare Other | Admitting: Internal Medicine

## 2013-07-01 VITALS — BP 122/82 | HR 57 | Temp 97.4°F | Resp 18 | Wt 194.2 lb

## 2013-07-01 DIAGNOSIS — I10 Essential (primary) hypertension: Secondary | ICD-10-CM

## 2013-07-01 DIAGNOSIS — M1710 Unilateral primary osteoarthritis, unspecified knee: Secondary | ICD-10-CM

## 2013-07-01 DIAGNOSIS — M1711 Unilateral primary osteoarthritis, right knee: Secondary | ICD-10-CM

## 2013-07-01 DIAGNOSIS — K219 Gastro-esophageal reflux disease without esophagitis: Secondary | ICD-10-CM

## 2013-07-01 DIAGNOSIS — IMO0002 Reserved for concepts with insufficient information to code with codable children: Secondary | ICD-10-CM

## 2013-07-01 DIAGNOSIS — R609 Edema, unspecified: Secondary | ICD-10-CM | POA: Insufficient documentation

## 2013-07-01 DIAGNOSIS — M542 Cervicalgia: Secondary | ICD-10-CM

## 2013-07-01 NOTE — Progress Notes (Signed)
Subjective:    Patient ID: [name], female    DOB: 04/08/1931, 79 y.o.   MRN: [id number]  HPI Planning right TKR on 07/04/13. Has already seen cardiologist, Dr. Shirlee Latch, and received approval. Holter showed rare runs of of SVT and occasional bradycardia. CXR on 06/26/13 was normal  Glucose has been elevated in the past , but was normal on 06/26/13.  BP is under control  She reports some left neck discomfort. She thinks it is due to the way she has to walk and the use of the cane. Neck sometimes pops like cracking knuckles.  She has no other significant symptoms other than reflux and heartburn.  Current Outpatient Prescriptions on File Prior to Visit  Medication Sig Dispense Refill  . ALPRAZolam (XANAX) 0.25 MG tablet Take 0.25 mg by mouth daily as needed for anxiety.       Marland Kitchen aspirin EC 81 MG tablet Take 81 mg by mouth every other day.       Marland Kitchen atorvastatin (LIPITOR) 10 MG tablet Take 10 mg by mouth every morning.       Marland Kitchen azelastine (OPTIVAR) 0.05 % ophthalmic solution 1 drop 2 (two) times daily.      . bimatoprost (LUMIGAN) 0.01 % SOLN Instill 1 drop into both eyes once daily in the evening      . Cholecalciferol (VITAMIN D) 2000 UNITS tablet Take 2,000 Units by mouth daily.      . diclofenac sodium (VOLTAREN) 1 % GEL Apply 10 g topically daily as needed (for knee).       . dorzolamide (TRUSOPT) 2 % ophthalmic solution Instill 1 drop in each eye twice a day      . Flaxseed-Eve Prim-Bilberry (TEARS AGAIN HYDRATE) 1000-500-40 MG CAPS Take 2 capsules by mouth 2 (two) times daily.      Marland Kitchen glucosamine-chondroitin 500-400 MG tablet Take 1 tablet by mouth 2 (two) times daily.      . methylcellulose (ARTIFICIAL TEARS) 1 % ophthalmic solution Place 1 drop into both eyes daily as needed (for dry eyes).       Marland Kitchen omeprazole (PRILOSEC) 40 MG capsule Take 40 mg by mouth daily as needed (for acid reflux).       . traMADol (ULTRAM) 50 MG tablet Take 50-100 mg by mouth 2 (two) times daily as needed for  pain.       . valsartan-hydrochlorothiazide (DIOVAN-HCT) 320-25 MG per tablet TAKE 1 TABLET EVERY DAY TO CONTROL BLOOD PRESSURE  30 tablet  4  . vitamin E 600 UNIT capsule Take 600 Units by mouth daily.      Marland Kitchen zolpidem (AMBIEN) 10 MG tablet Take 5 mg by mouth at bedtime as needed for sleep.           Active Ambulatory Problems    Diagnosis Date Noted  . HYPERCHOLESTEROLEMIA 06/02/2009  . ANXIETY 06/02/2009  . HYPERTENSION 06/02/2009  . ESOPHAGEAL STRICTURE 06/02/2009  . GERD 06/02/2009  . HIATAL HERNIA 06/02/2009  . DIVERTICULOSIS, COLON 06/02/2009  . HIP PAIN, RIGHT 03/27/2007  . BACK PAIN, CHRONIC 05/01/2007  . GREATER TROCHANTERIC BURSITIS 03/27/2007  . LEG PAIN, RIGHT 05/01/2007  . OTHER DYSPHAGIA 06/02/2009  . Bradycardia 05/13/2013  . Osteoarthritis of right knee 07/01/2013  . Cervicalgia 07/01/2013  . Edema 07/01/2013   Resolved Ambulatory Problems    Diagnosis Date Noted  . TRAVELER'S DIARRHEA 06/03/2009  . INCREASED BLOOD PRESSURE 06/04/2007   Past Medical History  Diagnosis Date  . Cramp of limb   .  Chest pain, unspecified   . Other nonspecific abnormal serum enzyme levels   . Hepatitis, unspecified   . Disturbance of skin sensation   . Unspecified vitamin D deficiency   . Pain in joint, hand   . Other abnormal blood chemistry   . Pain in joint, ankle and foot   . Plantar fascial fibromatosis   . Unspecified hypothyroidism   . Asymptomatic varicose veins   . Allergic rhinitis due to pollen   . Myalgia and myositis, unspecified   . Pain in joint, lower leg   . Sciatica   . Reflux esophagitis   . Depressive disorder, not elsewhere classified   . Other specified cardiac dysrhythmias(427.89)   . Other malaise and fatigue   . Insomnia, unspecified   . Carpal tunnel syndrome   . Spinal stenosis, unspecified region other than cervical   . Lumbago   . Symptomatic menopausal or female climacteric states   . Headache(784.0)   . Other and unspecified  hyperlipidemia   . Obesity, unspecified   . Peptic ulcer, unspecified site, unspecified as acute or chronic, without mention of hemorrhage, perforation, or obstruction      Review of Systems  Constitutional: Positive for activity change. Negative for fever, chills, diaphoresis, appetite change, fatigue and unexpected weight change.  HENT: Negative.   Eyes: Eye discharge: [email or web address].       Corrective lenses.   Respiratory: Negative.   Cardiovascular: Positive for leg swelling. Negative for chest pain and palpitations.  Gastrointestinal:       Frequent reflux and heartburn.  Endocrine:       History of elevations in glucose. Diet controlled.  Genitourinary: Positive for frequency.       Urinary leakage,  Musculoskeletal:       Chronic back pains. Right knee painful. Using tramadol. Having some left neck discomfort.  Neurological: Positive for numbness. Negative for dizziness, tremors, seizures, syncope, facial asymmetry, speech difficulty, weakness, light-headedness and headaches.       Episodes of numbness in the right hand.  Hematological: Negative.   Psychiatric/Behavioral: Negative.        Some difficulty falling asleep. Using less Ambien now.       Objective:   Physical Exam  Constitutional: She is oriented to person, place, and time. No distress.  Obese.  HENT:  Head: Normocephalic and atraumatic.  Right Ear: External ear normal.  Left Ear: External ear normal.  Eyes: Conjunctivae and EOM are normal. Pupils are equal, round, and reactive to light. Left eye exhibits no discharge. No scleral icterus.  Neck: Normal range of motion. Neck supple. No JVD present. No tracheal deviation present. No thyromegaly present.  Cardiovascular: Normal rate, regular rhythm and intact distal pulses.  Exam reveals no gallop and no friction rub.   No murmur heard. Pulmonary/Chest: Breath sounds normal. No respiratory distress. She has no wheezes. She has no rales. She exhibits no  tenderness.  Abdominal: Soft. Bowel sounds are normal. She exhibits no distension and no mass. There is no tenderness.  Musculoskeletal: She exhibits edema and tenderness.  Right knee is very uncomfortable. Using a cane. Mid tenderness left neck musculature.  Lymphadenopathy:    She has no cervical adenopathy.  Neurological: She is alert and oriented to person, place, and time. No cranial nerve deficit. Coordination normal.  Skin: No rash noted. No erythema. No pallor.  Psychiatric: She has a normal mood and affect. Her behavior is normal. Judgment and thought content normal.    Hospital Outpatient Visit on  06/26/2013  Component Date Value Range Status  . ABO/RH(D) 06/26/2013 A POS   Final  Hospital Outpatient Visit on 06/26/2013  Component Date Value Range Status  . MRSA, PCR 06/26/2013 NEGATIVE  NEGATIVE Final  . Staphylococcus aureus 06/26/2013 NEGATIVE  NEGATIVE Final   Comment:                                 The Xpert SA Assay (FDA                          approved for NASAL specimens                          in patients over 34 years of age),                          is one component of                          a comprehensive surveillance                          program.  Test performance has                          been validated by [employer] for patients greater                          than or equal to 64 year old.                          It is not intended                          to diagnose infection nor to                          guide or monitor treatment.  . Sodium 06/26/2013 138  135 - 145 mEq/L Final  . Potassium 06/26/2013 3.8  3.5 - 5.1 mEq/L Final  . Chloride 06/26/2013 101  96 - 112 mEq/L Final  . CO2 06/26/2013 31  19 - 32 mEq/L Final  . Glucose, Bld 06/26/2013 94  70 - 99 mg/dL Final  . BUN 16/09/9603 15  6 - 23 mg/dL Final  . Creatinine, Ser 06/26/2013 0.72  0.50 - 1.10 mg/dL Final  . Calcium 54/08/8118 9.5  8.4 - 10.5  mg/dL Final  . GFR calc non Af Amer 06/26/2013 79* >90 mL/min Final  . GFR calc Af Amer 06/26/2013 >90  >90 mL/min Final   Comment:                                 The eGFR has been calculated                          using the CKD EPI equation.  This calculation has not been                          validated in all clinical                          situations.                          eGFR's persistently                          <90 mL/min signify                          possible Chronic Kidney Disease.  . WBC 06/26/2013 5.9  4.0 - 10.5 K/uL Final  . RBC 06/26/2013 4.07  3.87 - 5.11 MIL/uL Final  . Hemoglobin 06/26/2013 12.1  12.0 - 15.0 g/dL Final  . HCT 45/40/9811 36.7  36.0 - 46.0 % Final  . MCV 06/26/2013 90.2  78.0 - 100.0 fL Final  . MCH 06/26/2013 29.7  26.0 - 34.0 pg Final  . MCHC 06/26/2013 33.0  30.0 - 36.0 g/dL Final  . RDW 91/47/8295 13.1  11.5 - 15.5 % Final  . Platelets 06/26/2013 183  150 - 400 K/uL Final  . Color, Urine 06/26/2013 YELLOW  YELLOW Final  . APPearance 06/26/2013 CLEAR  CLEAR Final  . Specific Gravity, Urine 06/26/2013 1.016  1.005 - 1.030 Final  . pH 06/26/2013 5.0  5.0 - 8.0 Final  . Glucose, UA 06/26/2013 NEGATIVE  NEGATIVE mg/dL Final  . Hgb urine dipstick 06/26/2013 NEGATIVE  NEGATIVE Final  . Bilirubin Urine 06/26/2013 NEGATIVE  NEGATIVE Final  . Ketones, ur 06/26/2013 NEGATIVE  NEGATIVE mg/dL Final  . Protein, ur 62/13/0865 NEGATIVE  NEGATIVE mg/dL Final  . Urobilinogen, UA 06/26/2013 0.2  0.0 - 1.0 mg/dL Final  . Nitrite 78/46/9629 NEGATIVE  NEGATIVE Final  . Leukocytes, UA 06/26/2013 NEGATIVE  NEGATIVE Final   MICROSCOPIC NOT DONE ON URINES WITH NEGATIVE PROTEIN, BLOOD, LEUKOCYTES, NITRITE, OR GLUCOSE <1000 mg/dL.  . ABO/RH(D) 06/26/2013 A POS   Final  . Antibody Screen 06/26/2013 NEG   Final  . Sample Expiration 06/26/2013 07/10/2013   Final  . Prothrombin Time 06/26/2013 13.5  11.6 - 15.2 seconds Final  . INR  06/26/2013 1.05  0.00 - 1.49 Final  . aPTT 06/26/2013 31  24 - 37 seconds Final         Assessment & Plan:  Osteoarthritis of right knee: Medically approved for TKR scheduled 07/04/13  HYPERTENSION: controlled  GERD: stable  Cervicalgia: musculoskeletal discomfort related to gait disturbance from knee pains and use of a cane.  Edema: chronic and mild. No new orders.

## 2013-07-01 NOTE — Patient Instructions (Signed)
You are approved for the surgery on your knee. Continue current medication.

## 2013-07-04 ENCOUNTER — Encounter (HOSPITAL_COMMUNITY): Payer: Self-pay | Admitting: *Deleted

## 2013-07-04 ENCOUNTER — Inpatient Hospital Stay (HOSPITAL_COMMUNITY)
Admission: RE | Admit: 2013-07-04 | Discharge: 2013-07-08 | DRG: 470 | Disposition: A | Discharge status: Home Health | Payer: Medicare Other | Source: Ambulatory Visit | Attending: Orthopaedic Surgery | Admitting: Orthopaedic Surgery

## 2013-07-04 ENCOUNTER — Inpatient Hospital Stay (HOSPITAL_COMMUNITY): Payer: Medicare Other

## 2013-07-04 ENCOUNTER — Encounter (HOSPITAL_COMMUNITY): Payer: Self-pay | Admitting: Anesthesiology

## 2013-07-04 ENCOUNTER — Inpatient Hospital Stay (HOSPITAL_COMMUNITY): Payer: Medicare Other | Admitting: Anesthesiology

## 2013-07-04 ENCOUNTER — Encounter (HOSPITAL_COMMUNITY)
Admission: RE | Disposition: A | Discharge status: Home Health | Payer: Self-pay | Source: Ambulatory Visit | Attending: Orthopaedic Surgery

## 2013-07-04 DIAGNOSIS — E669 Obesity, unspecified: Secondary | ICD-10-CM | POA: Diagnosis present

## 2013-07-04 DIAGNOSIS — E039 Hypothyroidism, unspecified: Secondary | ICD-10-CM | POA: Diagnosis present

## 2013-07-04 DIAGNOSIS — D62 Acute posthemorrhagic anemia: Secondary | ICD-10-CM | POA: Diagnosis not present

## 2013-07-04 DIAGNOSIS — Z6833 Body mass index (BMI) 33.0-33.9, adult: Secondary | ICD-10-CM

## 2013-07-04 DIAGNOSIS — Z8711 Personal history of peptic ulcer disease: Secondary | ICD-10-CM

## 2013-07-04 DIAGNOSIS — Z8719 Personal history of other diseases of the digestive system: Secondary | ICD-10-CM

## 2013-07-04 DIAGNOSIS — M1710 Unilateral primary osteoarthritis, unspecified knee: Principal | ICD-10-CM | POA: Diagnosis present

## 2013-07-04 DIAGNOSIS — M1711 Unilateral primary osteoarthritis, right knee: Secondary | ICD-10-CM

## 2013-07-04 DIAGNOSIS — I10 Essential (primary) hypertension: Secondary | ICD-10-CM | POA: Diagnosis present

## 2013-07-04 DIAGNOSIS — Z79899 Other long term (current) drug therapy: Secondary | ICD-10-CM

## 2013-07-04 DIAGNOSIS — E7800 Pure hypercholesterolemia, unspecified: Secondary | ICD-10-CM | POA: Diagnosis present

## 2013-07-04 DIAGNOSIS — E559 Vitamin D deficiency, unspecified: Secondary | ICD-10-CM | POA: Diagnosis present

## 2013-07-04 DIAGNOSIS — F411 Generalized anxiety disorder: Secondary | ICD-10-CM | POA: Diagnosis present

## 2013-07-04 HISTORY — PX: TOTAL KNEE ARTHROPLASTY: SHX125

## 2013-07-04 SURGERY — ARTHROPLASTY, KNEE, TOTAL
Anesthesia: Spinal | Site: Knee | Laterality: Right | Wound class: Clean

## 2013-07-04 MED ORDER — PROPOFOL INFUSION 10 MG/ML OPTIME
INTRAVENOUS | Status: DC | PRN
  Administered 2013-07-04: 160 ug/kg/min via INTRAVENOUS

## 2013-07-04 MED ORDER — METHOCARBAMOL 100 MG/ML IJ SOLN
500.0000 mg | Freq: Four times a day (QID) | INTRAVENOUS | Status: DC | PRN
  Administered 2013-07-04: 500 mg via INTRAVENOUS
  Filled 2013-07-04: qty 5

## 2013-07-04 MED ORDER — BUPIVACAINE IN DEXTROSE 0.75-8.25 % IT SOLN
INTRATHECAL | Status: DC | PRN
  Administered 2013-07-04: 1.8 mL via INTRATHECAL

## 2013-07-04 MED ORDER — CEFAZOLIN SODIUM 1-5 GM-% IV SOLN
1.0000 g | Freq: Four times a day (QID) | INTRAVENOUS | Status: AC
  Administered 2013-07-04 (×2): 1 g via INTRAVENOUS
  Filled 2013-07-04 (×2): qty 50

## 2013-07-04 MED ORDER — METOCLOPRAMIDE HCL 10 MG PO TABS: 5.0000 mg | ORAL_TABLET | Freq: Three times a day (TID) | ORAL | Status: DC | PRN

## 2013-07-04 MED ORDER — 0.9 % SODIUM CHLORIDE (POUR BTL) OPTIME
TOPICAL | Status: DC | PRN
  Administered 2013-07-04: 1000 mL

## 2013-07-04 MED ORDER — ZOLPIDEM TARTRATE 5 MG PO TABS
5.0000 mg | ORAL_TABLET | Freq: Every evening | ORAL | Status: DC | PRN
  Administered 2013-07-05 – 2013-07-07 (×4): 5 mg via ORAL
  Filled 2013-07-04 (×4): qty 1

## 2013-07-04 MED ORDER — SODIUM CHLORIDE 0.9 % IR SOLN
Status: DC | PRN
  Administered 2013-07-04: 2000 mL

## 2013-07-04 MED ORDER — HYDROMORPHONE HCL PF 1 MG/ML IJ SOLN
1.0000 mg | INTRAMUSCULAR | Status: DC | PRN
  Administered 2013-07-04: 0.5 mg via INTRAVENOUS
  Administered 2013-07-05: 1 mg via INTRAVENOUS
  Filled 2013-07-04 (×3): qty 1

## 2013-07-04 MED ORDER — RIVAROXABAN 10 MG PO TABS
10.0000 mg | ORAL_TABLET | Freq: Every day | ORAL | Status: DC
  Administered 2013-07-05 – 2013-07-08 (×4): 10 mg via ORAL
  Filled 2013-07-04 (×6): qty 1

## 2013-07-04 MED ORDER — BIMATOPROST 0.01 % OP SOLN
1.0000 [drp] | Freq: Every day | OPHTHALMIC | Status: DC
  Administered 2013-07-04 – 2013-07-07 (×2): 1 [drp] via OPHTHALMIC
  Filled 2013-07-04: qty 2.5

## 2013-07-04 MED ORDER — LACTATED RINGERS IV SOLN
INTRAVENOUS | Status: DC | PRN
  Administered 2013-07-04 (×4): via INTRAVENOUS

## 2013-07-04 MED ORDER — PHENOL 1.4 % MT LIQD: 1.0000 | OROMUCOSAL | Status: DC | PRN

## 2013-07-04 MED ORDER — ATORVASTATIN CALCIUM 10 MG PO TABS
10.0000 mg | ORAL_TABLET | Freq: Every day | ORAL | Status: DC
  Administered 2013-07-05 – 2013-07-07 (×3): 10 mg via ORAL
  Filled 2013-07-04 (×4): qty 1

## 2013-07-04 MED ORDER — OXYCODONE HCL ER 10 MG PO T12A
10.0000 mg | EXTENDED_RELEASE_TABLET | Freq: Two times a day (BID) | ORAL | Status: DC
  Administered 2013-07-04 – 2013-07-08 (×7): 10 mg via ORAL
  Filled 2013-07-04 (×7): qty 1

## 2013-07-04 MED ORDER — CEFAZOLIN SODIUM-DEXTROSE 2-3 GM-% IV SOLR
2.0000 g | INTRAVENOUS | Status: AC
  Administered 2013-07-04: 2 g via INTRAVENOUS

## 2013-07-04 MED ORDER — ONDANSETRON HCL 4 MG PO TABS: 4.0000 mg | ORAL_TABLET | Freq: Four times a day (QID) | ORAL | Status: DC | PRN

## 2013-07-04 MED ORDER — MIDAZOLAM HCL 5 MG/5ML IJ SOLN
INTRAMUSCULAR | Status: DC | PRN
  Administered 2013-07-04: 2 mg via INTRAVENOUS

## 2013-07-04 MED ORDER — HYDROCHLOROTHIAZIDE 25 MG PO TABS
25.0000 mg | ORAL_TABLET | Freq: Every day | ORAL | Status: DC
  Administered 2013-07-05 – 2013-07-08 (×4): 25 mg via ORAL
  Filled 2013-07-04 (×5): qty 1

## 2013-07-04 MED ORDER — METOCLOPRAMIDE HCL 5 MG/ML IJ SOLN: 5.0000 mg | Freq: Three times a day (TID) | INTRAMUSCULAR | Status: DC | PRN

## 2013-07-04 MED ORDER — MEPERIDINE HCL 50 MG/ML IJ SOLN: 6.2500 mg | INTRAMUSCULAR | Status: DC | PRN

## 2013-07-04 MED ORDER — ACETAMINOPHEN 650 MG RE SUPP: 650.0000 mg | Freq: Four times a day (QID) | RECTAL | Status: DC | PRN

## 2013-07-04 MED ORDER — POLYETHYLENE GLYCOL 3350 17 G PO PACK
17.0000 g | PACK | Freq: Every day | ORAL | Status: DC | PRN
  Administered 2013-07-08: 17 g via ORAL

## 2013-07-04 MED ORDER — PROMETHAZINE HCL 25 MG/ML IJ SOLN: 6.2500 mg | INTRAMUSCULAR | Status: DC | PRN

## 2013-07-04 MED ORDER — VALSARTAN-HYDROCHLOROTHIAZIDE 320-25 MG PO TABS: 1.0000 | ORAL_TABLET | Freq: Every day | ORAL | Status: DC

## 2013-07-04 MED ORDER — FENTANYL CITRATE 0.05 MG/ML IJ SOLN
25.0000 ug | INTRAMUSCULAR | Status: DC | PRN
  Administered 2013-07-04 (×2): 50 ug via INTRAVENOUS

## 2013-07-04 MED ORDER — PROPOFOL 10 MG/ML IV BOLUS
INTRAVENOUS | Status: DC | PRN
  Administered 2013-07-04: 30 mg via INTRAVENOUS

## 2013-07-04 MED ORDER — ALUM & MAG HYDROXIDE-SIMETH 200-200-20 MG/5ML PO SUSP: 30.0000 mL | ORAL | Status: DC | PRN

## 2013-07-04 MED ORDER — HYDROMORPHONE HCL PF 1 MG/ML IJ SOLN
0.2500 mg | INTRAMUSCULAR | Status: DC | PRN
  Administered 2013-07-04 (×4): 0.5 mg via INTRAVENOUS

## 2013-07-04 MED ORDER — MENTHOL 3 MG MT LOZG: 1.0000 | LOZENGE | OROMUCOSAL | Status: DC | PRN

## 2013-07-04 MED ORDER — FENTANYL CITRATE 0.05 MG/ML IJ SOLN
INTRAMUSCULAR | Status: AC
  Filled 2013-07-04: qty 2

## 2013-07-04 MED ORDER — LACTATED RINGERS IV SOLN: INTRAVENOUS | Status: DC

## 2013-07-04 MED ORDER — IRBESARTAN 300 MG PO TABS
300.0000 mg | ORAL_TABLET | Freq: Every day | ORAL | Status: DC
  Administered 2013-07-05 – 2013-07-08 (×4): 300 mg via ORAL
  Filled 2013-07-04 (×5): qty 1

## 2013-07-04 MED ORDER — SODIUM CHLORIDE 0.9 % IV SOLN
INTRAVENOUS | Status: DC
  Administered 2013-07-04 – 2013-07-05 (×2): via INTRAVENOUS

## 2013-07-04 MED ORDER — DOCUSATE SODIUM 100 MG PO CAPS
100.0000 mg | ORAL_CAPSULE | Freq: Two times a day (BID) | ORAL | Status: DC
  Administered 2013-07-04 – 2013-07-08 (×6): 100 mg via ORAL

## 2013-07-04 MED ORDER — OLOPATADINE HCL 0.1 % OP SOLN
1.0000 [drp] | Freq: Two times a day (BID) | OPHTHALMIC | Status: DC
  Administered 2013-07-04 – 2013-07-08 (×6): 1 [drp] via OPHTHALMIC
  Filled 2013-07-04: qty 5

## 2013-07-04 MED ORDER — ALPRAZOLAM 0.25 MG PO TABS
0.2500 mg | ORAL_TABLET | Freq: Every day | ORAL | Status: DC | PRN
  Administered 2013-07-06 – 2013-07-07 (×2): 0.25 mg via ORAL
  Filled 2013-07-04 (×3): qty 1

## 2013-07-04 MED ORDER — PANTOPRAZOLE SODIUM 40 MG PO TBEC
80.0000 mg | DELAYED_RELEASE_TABLET | Freq: Every day | ORAL | Status: DC
  Administered 2013-07-05 – 2013-07-08 (×4): 80 mg via ORAL
  Filled 2013-07-04 (×4): qty 2

## 2013-07-04 MED ORDER — BISACODYL 5 MG PO TBEC: 5.0000 mg | DELAYED_RELEASE_TABLET | Freq: Every day | ORAL | Status: DC | PRN

## 2013-07-04 MED ORDER — DORZOLAMIDE HCL 2 % OP SOLN
1.0000 [drp] | Freq: Two times a day (BID) | OPHTHALMIC | Status: DC
  Administered 2013-07-04 – 2013-07-08 (×6): 1 [drp] via OPHTHALMIC
  Filled 2013-07-04: qty 10

## 2013-07-04 MED ORDER — OXYCODONE HCL 5 MG PO TABS
5.0000 mg | ORAL_TABLET | ORAL | Status: DC | PRN
  Administered 2013-07-04 – 2013-07-08 (×25): 10 mg via ORAL
  Filled 2013-07-04 (×26): qty 2

## 2013-07-04 MED ORDER — EPHEDRINE SULFATE 50 MG/ML IJ SOLN
INTRAMUSCULAR | Status: DC | PRN
  Administered 2013-07-04: 5 mg via INTRAVENOUS

## 2013-07-04 MED ORDER — HYDROMORPHONE HCL PF 1 MG/ML IJ SOLN
INTRAMUSCULAR | Status: AC
  Administered 2013-07-05: 1 mg via INTRAVENOUS
  Filled 2013-07-04: qty 1

## 2013-07-04 MED ORDER — METHOCARBAMOL 500 MG PO TABS
500.0000 mg | ORAL_TABLET | Freq: Four times a day (QID) | ORAL | Status: DC | PRN
  Administered 2013-07-04 – 2013-07-08 (×10): 500 mg via ORAL
  Filled 2013-07-04 (×11): qty 1

## 2013-07-04 MED ORDER — AZELASTINE HCL 0.05 % OP SOLN: 1.0000 [drp] | Freq: Two times a day (BID) | OPHTHALMIC | Status: DC

## 2013-07-04 MED ORDER — FENTANYL CITRATE 0.05 MG/ML IJ SOLN
INTRAMUSCULAR | Status: DC | PRN
  Administered 2013-07-04 (×2): 50 ug via INTRAVENOUS

## 2013-07-04 MED ORDER — ONDANSETRON HCL 4 MG/2ML IJ SOLN
4.0000 mg | Freq: Four times a day (QID) | INTRAMUSCULAR | Status: DC | PRN
  Administered 2013-07-04: 4 mg via INTRAVENOUS
  Filled 2013-07-04: qty 2

## 2013-07-04 MED ORDER — ACETAMINOPHEN 325 MG PO TABS
650.0000 mg | ORAL_TABLET | Freq: Four times a day (QID) | ORAL | Status: DC | PRN
  Administered 2013-07-04: 650 mg via ORAL
  Filled 2013-07-04 (×2): qty 2

## 2013-07-04 MED ORDER — DIPHENHYDRAMINE HCL 12.5 MG/5ML PO ELIX: 12.5000 mg | ORAL_SOLUTION | ORAL | Status: DC | PRN

## 2013-07-04 SURGICAL SUPPLY — 62 items
ADH SKN CLS APL DERMABOND .7 (GAUZE/BANDAGES/DRESSINGS) ×1
BAG SPEC THK2 15X12 ZIP CLS (MISCELLANEOUS) ×1
BAG ZIPLOCK 12X15 (MISCELLANEOUS) ×2 IMPLANT
BANDAGE ELASTIC 6 VELCRO ST LF (GAUZE/BANDAGES/DRESSINGS) ×3 IMPLANT
BANDAGE ESMARK 6X9 LF (GAUZE/BANDAGES/DRESSINGS) ×1 IMPLANT
BLADE SAG 18X100X1.27 (BLADE) ×2 IMPLANT
BLADE SAW SGTL 13.0X1.19X90.0M (BLADE) ×1 IMPLANT
BNDG CMPR 9X6 STRL LF SNTH (GAUZE/BANDAGES/DRESSINGS) ×1
BNDG ESMARK 6X9 LF (GAUZE/BANDAGES/DRESSINGS) ×2
BOWL SMART MIX CTS (DISPOSABLE) ×2 IMPLANT
CEMENT BONE 1-PACK (Cement) ×4 IMPLANT
CLOTH BEACON ORANGE TIMEOUT ST (SAFETY) ×2 IMPLANT
CUFF TOURN SGL QUICK 34 (TOURNIQUET CUFF) ×2
CUFF TRNQT CYL 34X4X40X1 (TOURNIQUET CUFF) ×1 IMPLANT
DERMABOND ADVANCED (GAUZE/BANDAGES/DRESSINGS) ×1
DERMABOND ADVANCED .7 DNX12 (GAUZE/BANDAGES/DRESSINGS) ×1 IMPLANT
DRAPE EXTREMITY T 121X128X90 (DRAPE) ×2 IMPLANT
DRAPE LG THREE QUARTER DISP (DRAPES) IMPLANT
DRAPE POUCH INSTRU U-SHP 10X18 (DRAPES) ×2 IMPLANT
DRAPE U-SHAPE 47X51 STRL (DRAPES) ×2 IMPLANT
DRSG AQUACEL AG ADV 3.5X10 (GAUZE/BANDAGES/DRESSINGS) ×2 IMPLANT
DRSG PAD ABDOMINAL 8X10 ST (GAUZE/BANDAGES/DRESSINGS) IMPLANT
DRSG TEGADERM 4X4.75 (GAUZE/BANDAGES/DRESSINGS) ×2 IMPLANT
DURAPREP 26ML APPLICATOR (WOUND CARE) ×2 IMPLANT
ELECT REM PT RETURN 9FT ADLT (ELECTROSURGICAL) ×2
ELECTRODE REM PT RTRN 9FT ADLT (ELECTROSURGICAL) ×1 IMPLANT
EVACUATOR 1/8 PVC DRAIN (DRAIN) ×2 IMPLANT
FACESHIELD LNG OPTICON STERILE (SAFETY) ×10 IMPLANT
GAUZE SPONGE 2X2 8PLY STRL LF (GAUZE/BANDAGES/DRESSINGS) ×1 IMPLANT
GAUZE XEROFORM 5X9 LF (GAUZE/BANDAGES/DRESSINGS) IMPLANT
GLOVE BIO SURGEON STRL SZ7.5 (GLOVE) ×2 IMPLANT
GLOVE BIOGEL PI IND STRL 8 (GLOVE) ×2 IMPLANT
GLOVE BIOGEL PI INDICATOR 8 (GLOVE) ×2
GLOVE ECLIPSE 8.0 STRL XLNG CF (GLOVE) ×2 IMPLANT
GOWN STRL REIN XL XLG (GOWN DISPOSABLE) ×5 IMPLANT
HANDPIECE INTERPULSE COAX TIP (DISPOSABLE) ×2
IMMOBILIZER KNEE 20 (SOFTGOODS) ×2
IMMOBILIZER KNEE 20 THIGH 36 (SOFTGOODS) ×1 IMPLANT
KIT BASIN OR (CUSTOM PROCEDURE TRAY) ×2 IMPLANT
KNEE LEVEL 1 ×1 IMPLANT
NS IRRIG 1000ML POUR BTL (IV SOLUTION) ×2 IMPLANT
PACK TOTAL JOINT (CUSTOM PROCEDURE TRAY) ×2 IMPLANT
PADDING CAST COTTON 6X4 STRL (CAST SUPPLIES) ×2 IMPLANT
POSITIONER SURGICAL ARM (MISCELLANEOUS) ×2 IMPLANT
SET HNDPC FAN SPRY TIP SCT (DISPOSABLE) ×1 IMPLANT
SET PAD KNEE POSITIONER (MISCELLANEOUS) ×2 IMPLANT
SPONGE GAUZE 2X2 STER 10/PKG (GAUZE/BANDAGES/DRESSINGS) ×1
SPONGE GAUZE 4X4 12PLY (GAUZE/BANDAGES/DRESSINGS) IMPLANT
STAPLER VISISTAT 35W (STAPLE) ×1 IMPLANT
SUCTION FRAZIER 12FR DISP (SUCTIONS) ×2 IMPLANT
SUT MNCRL AB 4-0 PS2 18 (SUTURE) ×2 IMPLANT
SUT VIC AB 0 CT1 27 (SUTURE) ×2
SUT VIC AB 0 CT1 27XBRD ANTBC (SUTURE) ×2 IMPLANT
SUT VIC AB 1 CT1 27 (SUTURE) ×6
SUT VIC AB 1 CT1 27XBRD ANTBC (SUTURE) ×3 IMPLANT
SUT VIC AB 2-0 CT1 27 (SUTURE) ×4
SUT VIC AB 2-0 CT1 TAPERPNT 27 (SUTURE) ×2 IMPLANT
TOWEL OR 17X26 10 PK STRL BLUE (TOWEL DISPOSABLE) ×3 IMPLANT
TOWEL OR NON WOVEN STRL DISP B (DISPOSABLE) ×2 IMPLANT
TRAY FOLEY CATH 14FRSI W/METER (CATHETERS) ×2 IMPLANT
WATER STERILE IRR 1500ML POUR (IV SOLUTION) ×3 IMPLANT
WRAP KNEE MAXI GEL POST OP (GAUZE/BANDAGES/DRESSINGS) ×2 IMPLANT

## 2013-07-04 NOTE — Progress Notes (Signed)
CSW consulted for SNF placement. Met with pt/spouse to assist with d/c planning. Pt hopes to return home with [facility] services  following hospital d/c. RNCM will assist with d/c planning. CSW is available to assist with SNF placement if plan changes and placement is required.  Cori Razor LCSW [phone]

## 2013-07-04 NOTE — Anesthesia Procedure Notes (Signed)
Spinal  Patient location during procedure: OR Staffing Anesthesiologist: Phillips Grout Performed by: anesthesiologist  Preanesthetic Checklist Completed: patient identified, site marked, surgical consent, pre-op evaluation, timeout performed, IV checked, risks and benefits discussed and monitors and equipment checked Spinal Block Patient position: sitting Prep: Betadine Patient monitoring: heart rate, continuous pulse ox and blood pressure Approach: midline Location: L3-4 Injection technique: single-shot Needle Needle type: Pencan  Needle gauge: 24 G Needle length: 9 cm Additional Notes Expiration date of kit checked and confirmed. Patient tolerated procedure well, without complications.

## 2013-07-04 NOTE — Anesthesia Preprocedure Evaluation (Addendum)
Anesthesia Evaluation  Patient identified by MRN, date of birth, ID band Patient awake    Reviewed: Allergy & Precautions, H&P , NPO status , Patient's Chart, lab work & pertinent test results  Airway Mallampati: II TM Distance: >3 FB Neck ROM: Full    Dental no notable dental hx.    Pulmonary neg pulmonary ROS,  breath sounds clear to auscultation  Pulmonary exam normal       Cardiovascular hypertension, negative cardio ROS  Rhythm:Regular Rate:Normal     Neuro/Psych PSYCHIATRIC DISORDERS Anxiety Depression negative neurological ROS  negative psych ROS   GI/Hepatic negative GI ROS, Neg liver ROS,   Endo/Other  negative endocrine ROS  Renal/GU negative Renal ROS  negative genitourinary   Musculoskeletal negative musculoskeletal ROS (+)   Abdominal   Peds negative pediatric ROS (+)  Hematology negative hematology ROS (+)   Anesthesia Other Findings   Reproductive/Obstetrics negative OB ROS                          Anesthesia Physical Anesthesia Plan  ASA: II  Anesthesia Plan: Spinal   Post-op Pain Management:    Induction:   Airway Management Planned: Simple Face Mask  Additional Equipment:   Intra-op Plan:   Post-operative Plan:   Informed Consent: I have reviewed the patients History and Physical, chart, labs and discussed the procedure including the risks, benefits and alternatives for the proposed anesthesia with the patient or authorized representative who has indicated his/her understanding and acceptance.   Dental advisory given  Plan Discussed with: CRNA  Anesthesia Plan Comments:         Anesthesia Quick Evaluation

## 2013-07-04 NOTE — Plan of Care (Signed)
Problem: Consults Goal: Diagnosis- Total Joint Replacement Right total knee     

## 2013-07-04 NOTE — Op Note (Signed)
NAMEMarland Kitchen  [name], [name] NO.:  [id number]  MEDICAL RECORD NO.:  [id number]  LOCATION:  [facility]                         FACILITY:  [facility]  PHYSICIAN:  Vanita Panda. Magnus Ivan, M.D.DATE OF BIRTH:  1931-11-06  DATE OF PROCEDURE:  07/04/2013 DATE OF DISCHARGE:                              OPERATIVE REPORT   PREOPERATIVE DIAGNOSIS:  Severe end-stage arthritis and degenerative joint disease, right knee.  PROCEDURE:  Right total knee arthroplasty.  IMPLANT:  Stryker triathlon knee with size 3 femur, size 3 tibial tray, 9 mm polyethylene insert, size 29 patellar button.  SURGEON:  Vanita Panda. Magnus Ivan, M.D.  ASSISTANT:  Richardean Canal, PA-C  ANESTHESIA:  Spinal.  ANTIBIOTICS:  2 g IV Ancef.  TOURNIQUET TIME:  52 minutes.  BLOOD LOSS:  Less than 200 mL.  COMPLICATIONS:  None.  INDICATIONS:  [name] is a 79 year old female with severe debilitating arthritis involving her right knee.  She ambulates with a walker.  She has had multiple injections in that right knee and even arthroscopic intervention showed complete loss of cartilage in the knee.  Due to debilitating pain, the effect on her mobility and her daily living, she wished to proceed with a total knee arthroplasty.  She has had this done about 8 or 9 years ago on the left side and did quite well.  She understands fully the risks of acute blood loss anemia, fracture, nerve and vessel injury, infection, DVT, and PE.  The goals are improved mobility and decreased pain as well as improved quality of life.  PROCEDURE DESCRIPTION:  After informed consent was obtained, appropriate right leg was marked.  She was brought to the operating room and placed supine on the operating room table.  She was then sat up and spinal anesthesia was obtained.  She was then laid back in supine position.  A Foley catheter was placed.  A nonsterile tourniquet was placed around her upper right thigh.  Her right leg was then prepped  and draped from the thigh down the ankle with DuraPrep and sterile drapes including sterile stockinette.  A time-out was called to identify the correct patient, correct right knee.  I then used an Esmarch to wrap out the leg and the tourniquet was inflated to 300 mm of pressure.  We then made incision directly over the patella and carried this proximally and distally.  I dissected down to the knee joint and performed a medial parapatellar arthrotomy.  I drained a large effusion from the knee and then removed remnants of the medial and lateral meniscus, ACL, PCL, then multiple osteophytes throughout the knee.  We did find her knee significantly devoid of cartilage.  Then with the knee flexed using the external medullary cutting guide for the tibia, we set our tibial cutting guide and took 9 mm off the high side, correcting for 0 slope and neutral varus valgus.  Once we made our tibial cut, we then used an intramedullary guide for the femur.  I made placed the femoral guide for 10 mm distal femoral cut at 5 degrees right.  We made our distal femoral cut and then removed all the pins.  We brought the knee back  down to extension.  With extension block and 9 mm block, she had full extension. We then flexed the knee again and put our femoral sizing guide and sized for a size 3 femur based off the epicondylar axis.  We got a 4:1 cutting guide and made our anterior and posterior cuts followed by our chamfer cuts.  We then made our femoral box cut.  Attention was then turned back to the tibia.  We trialed size for a size 3 tibia, and made our tibial keel cut with this.  With the trial components and the femoral and tibial components in place, we placed a 9 mm trial insert and put the knee through range of motion.  It was nice and stable with full motion. I then made a cut for patella and drilled holes for a size 29 patellar button.  With all trial components in place, again I was placed.  We then  removed all trial components.  We thoroughly irrigated the knee with normal saline solution using pulsatile lavage.  We then cemented the real Stryker triathlon tibial component size 3 followed by the real size 3 femur.  We cleaned the cement debris from the knee and placed a 9 mm polyethylene insert.  We then cemented the patellar button.  Once the cement had fully hardened, we let the tourniquet down.  Hemostasis was obtained with electrocautery.  We thoroughly irrigated the knee again with normal saline solution using pulsatile lavage.  We placed a medium Hemovac in the arthrotomy and closed the arthrotomy with interrupted #1 Vicryl suture followed by 0 Vicryl in the deep tissue, 2-0 Vicryl in the subcutaneous tissue, 4-0 Monocryl subcuticular stitch, Dermabond on the skin, and Aquacel dressing.  She was then taken to the recovery room in stable condition.  All final counts were correct.  There were no complications noted.  Of note, Richardean Canal, PA-C was present the entire case and his presence was crucial for retracting, patient positioning, assisting with getting the implants in good position, aligned, and closure of the wound.     Vanita Panda. Magnus Ivan, M.D.     CYB/MEDQ  D:  07/04/2013  T:  07/04/2013  Job:  [id number]

## 2013-07-04 NOTE — H&P (Signed)
TOTAL KNEE ADMISSION H&P  Patient is being admitted for right total knee arthroplasty.  Subjective:  Chief Complaint:right knee pain.  HPI: [name], 79 y.o. female, has a history of pain and functional disability in the right knee due to arthritis and has failed non-surgical conservative treatments for greater than 12 weeks to includeNSAID's and/or analgesics, corticosteriod injections, viscosupplementation injections, use of assistive devices and activity modification.  Onset of symptoms was gradual, starting 5 years ago with gradually worsening course since that time. The patient noted prior procedures on the knee to include  arthroscopy on the right knee(s).  Patient currently rates pain in the right knee(s) at 10 out of 10 with activity. Patient has night pain, worsening of pain with activity and weight bearing, pain that interferes with activities of daily living, pain with passive range of motion, crepitus and joint swelling.  Patient has evidence of subchondral sclerosis, periarticular osteophytes and joint space narrowing by imaging studies. There is no active infection.  Patient Active Problem List   Diagnosis Date Noted  . Arthritis of knee, right 07/04/2013  . Osteoarthritis of right knee 07/01/2013  . Cervicalgia 07/01/2013  . Edema 07/01/2013  . Bradycardia 05/13/2013  . HYPERCHOLESTEROLEMIA 06/02/2009  . ANXIETY 06/02/2009  . HYPERTENSION 06/02/2009  . ESOPHAGEAL STRICTURE 06/02/2009  . GERD 06/02/2009  . HIATAL HERNIA 06/02/2009  . DIVERTICULOSIS, COLON 06/02/2009  . OTHER DYSPHAGIA 06/02/2009  . BACK PAIN, CHRONIC 05/01/2007  . LEG PAIN, RIGHT 05/01/2007  . HIP PAIN, RIGHT 03/27/2007  . GREATER TROCHANTERIC BURSITIS 03/27/2007   Past Medical History  Diagnosis Date  . Anxiety state, unspecified   . Backache, unspecified   . Diverticulosis of colon (without mention of hemorrhage)   . Stricture and stenosis of esophagus   . Esophageal reflux   . Enthesopathy  of hip region   . Diaphragmatic hernia without mention of obstruction or gangrene   . Pain in joint, pelvic region and thigh   . Pure hypercholesterolemia   . Unspecified essential hypertension   . Elevated blood pressure reading without diagnosis of hypertension   . Pain in limb   . Other dysphagia   . Cramp of limb   . Chest pain, unspecified   . Other nonspecific abnormal serum enzyme levels   . Hepatitis, unspecified   . Disturbance of skin sensation   . Unspecified vitamin D deficiency   . Pain in joint, hand   . Other abnormal blood chemistry   . Pain in joint, ankle and foot   . Plantar fascial fibromatosis   . Unspecified hypothyroidism   . Asymptomatic varicose veins   . Allergic rhinitis due to pollen   . Myalgia and myositis, unspecified   . Pain in joint, lower leg   . Sciatica   . Reflux esophagitis   . Anxiety state, unspecified   . Depressive disorder, not elsewhere classified   . Other specified cardiac dysrhythmias(427.89)   . Other malaise and fatigue   . Insomnia, unspecified   . Carpal tunnel syndrome   . Spinal stenosis, unspecified region other than cervical   . Lumbago   . Symptomatic menopausal or female climacteric states   . Unspecified essential hypertension   . Headache(784.0)   . Other and unspecified hyperlipidemia   . Obesity, unspecified   . Diaphragmatic hernia without mention of obstruction or gangrene   . Peptic ulcer, unspecified site, unspecified as acute or chronic, without mention of hemorrhage, perforation, or obstruction     Past  Surgical History  Procedure Laterality Date  . Cholecystectomy  1989    DR BOWMAN  . Abdominal hysterectomy  1979  . Knee surgery  2003  . Breast biopsy      twice  . Excision of actinic keratosis      DR LUPTON   . O.s. cataract removed      DR EPES   . O.d. cataract removed      DR EPES  . Laser for cloudy cap left eye      DR DIGBY  . Right tkr      DR RENDALL  . Left tkr      DR RENDALL   . Knee arthroscopy Right 06-26-13    Prescriptions prior to admission  Medication Sig Dispense Refill  . ALPRAZolam (XANAX) 0.25 MG tablet Take 0.25 mg by mouth daily as needed for anxiety.       Marland Kitchen aspirin EC 81 MG tablet Take 81 mg by mouth every other day.       Marland Kitchen atorvastatin (LIPITOR) 10 MG tablet Take 10 mg by mouth every morning.       Marland Kitchen azelastine (OPTIVAR) 0.05 % ophthalmic solution 1 drop 2 (two) times daily.      . bimatoprost (LUMIGAN) 0.01 % SOLN Instill 1 drop into both eyes once daily in the evening      . diclofenac sodium (VOLTAREN) 1 % GEL Apply 10 g topically daily as needed (for knee).       . dorzolamide (TRUSOPT) 2 % ophthalmic solution Instill 1 drop in each eye twice a day      . glucosamine-chondroitin 500-400 MG tablet Take 1 tablet by mouth 2 (two) times daily.      . methylcellulose (ARTIFICIAL TEARS) 1 % ophthalmic solution Place 1 drop into both eyes daily as needed (for dry eyes).       Marland Kitchen omeprazole (PRILOSEC) 40 MG capsule Take 40 mg by mouth daily as needed (for acid reflux).       . traMADol (ULTRAM) 50 MG tablet Take 50-100 mg by mouth 2 (two) times daily as needed for pain.       . valsartan-hydrochlorothiazide (DIOVAN-HCT) 320-25 MG per tablet TAKE 1 TABLET EVERY DAY TO CONTROL BLOOD PRESSURE  30 tablet  4  . vitamin E 600 UNIT capsule Take 600 Units by mouth daily.      . Cholecalciferol (VITAMIN D) 2000 UNITS tablet Take 2,000 Units by mouth daily.      Judie Petit Prim-Bilberry (TEARS AGAIN HYDRATE) 1000-500-40 MG CAPS Take 2 capsules by mouth 2 (two) times daily.      Marland Kitchen zolpidem (AMBIEN) 10 MG tablet Take 5 mg by mouth at bedtime as needed for sleep.        Allergies  Allergen Reactions  . Advil (Ibuprofen)   . Aleve (Naproxen Sodium)   . Celebrex (Celecoxib)   . Diclofenac   . Metoclopramide Hcl     REACTION: heart palps  . Nsaids Other (See Comments)    Tears my stomach up   . Other     Antihistamine    History  Substance Use Topics  .  Smoking status: Never Smoker   . Smokeless tobacco: Never Used  . Alcohol Use: No    Family History  Problem Relation Age of Onset  . Ovarian cancer Mother   . Uterine cancer Mother   . Heart disease Father      Review of Systems  All other systems reviewed  and are negative.    Objective:  Physical Exam  Constitutional: She is oriented to person, place, and time. She appears well-developed and well-nourished.  HENT:  Head: Normocephalic and atraumatic.  Eyes: EOM are normal. Pupils are equal, round, and reactive to light.  Neck: Normal range of motion. Neck supple.  Cardiovascular: Normal rate and regular rhythm.   Respiratory: Effort normal and breath sounds normal.  GI: Soft. Bowel sounds are normal.  Musculoskeletal:       Right knee: She exhibits decreased range of motion and effusion. Tenderness found. Medial joint line and lateral joint line tenderness noted.  Neurological: She is alert and oriented to person, place, and time.  Skin: Skin is warm and dry.  Psychiatric: She has a normal mood and affect.    Vital signs in last 24 hours: Temp:  [97.9 F (36.6 C)] 97.9 F (36.6 C) (07/11 0546) Pulse Rate:  [49] 49 (07/11 0546) Resp:  [16] 16 (07/11 0546) BP: (154)/(72) 154/72 mmHg (07/11 0546) SpO2:  [98 %] 98 % (07/11 0546)  Labs:   Estimated body mass index is 32.94 kg/(m^2) as calculated from the following:   Height as of 06/26/13: 5\' 4"  (1.626 m).   Weight as of 05/12/13: 87.091 kg (192 lb).   Imaging Review Plain radiographs demonstrate severe degenerative joint disease of the right knee(s). The overall alignment ismild varus. The bone quality appears to be good for age and reported activity level.  Assessment/Plan:  End stage arthritis, right knee   The patient history, physical examination, clinical judgment of the provider and imaging studies are consistent with end stage degenerative joint disease of the right knee(s) and total knee arthroplasty is  deemed medically necessary. The treatment options including medical management, injection therapy arthroscopy and arthroplasty were discussed at length. The risks and benefits of total knee arthroplasty were presented and reviewed. The risks due to aseptic loosening, infection, stiffness, patella tracking problems, thromboembolic complications and other imponderables were discussed. The patient acknowledged the explanation, agreed to proceed with the plan and consent was signed. Patient is being admitted for inpatient treatment for surgery, pain control, PT, OT, prophylactic antibiotics, VTE prophylaxis, progressive ambulation and ADL's and discharge planning. The patient is planning to be discharged to skilled nursing facility

## 2013-07-04 NOTE — Brief Op Note (Signed)
07/04/2013  9:16 AM  PATIENT:  [name]  79 y.o. female  PRE-OPERATIVE DIAGNOSIS:  Severe osteoarthritis right knee  POST-OPERATIVE DIAGNOSIS:  Severe osteoarthritis right knee  PROCEDURE:  Procedure(s): RIGHT TOTAL KNEE ARTHROPLASTY (Right)  SURGEON:  Surgeon(s) and Role:    * Kathryne Hitch, MD - Primary  PHYSICIAN ASSISTANT: Rexene Edison, PA-C  ANESTHESIA:   spinal  EBL:  Total I/O In: 3000 [I.V.:3000] Out: 280 [Urine:280]  BLOOD ADMINISTERED:none  DRAINS: (medium) Hemovact drain(s) in the knee joint with  Suction Open   LOCAL MEDICATIONS USED:  NONE  SPECIMEN:  No Specimen  DISPOSITION OF SPECIMEN:  N/A  COUNTS:  YES  TOURNIQUET:   Total Tourniquet Time Documented: Thigh (Right) - 53 minutes Total: Thigh (Right) - 53 minutes   DICTATION: .Other Dictation: Dictation Number [id number]  PLAN OF CARE: Admit to inpatient   PATIENT DISPOSITION:  PACU - hemodynamically stable.   Delay start of Pharmacological VTE agent (>24hrs) due to surgical blood loss or risk of bleeding: no

## 2013-07-04 NOTE — Transfer of Care (Signed)
Immediate Anesthesia Transfer of Care Note  Patient: [name]  Procedure(s) Performed: Procedure(s): RIGHT TOTAL KNEE ARTHROPLASTY (Right)  Patient Location: PACU  Anesthesia Type:Regional and Spinal  Level of Consciousness: awake, alert , oriented and patient cooperative  Airway & Oxygen Therapy: Patient Spontanous Breathing and Patient connected to face mask oxygen  Post-op Assessment: Report given to PACU RN and Post -op Vital signs reviewed and stable  Post vital signs: Reviewed and stable  Complications: No apparent anesthesia complications

## 2013-07-04 NOTE — Anesthesia Postprocedure Evaluation (Signed)
  Anesthesia Post-op Note  Patient: [name]  Procedure(s) Performed: Procedure(s) (LRB): RIGHT TOTAL KNEE ARTHROPLASTY (Right)  Patient Location: PACU  Anesthesia Type: Spinal  Level of Consciousness: awake and alert   Airway and Oxygen Therapy: Patient Spontanous Breathing  Post-op Pain: mild  Post-op Assessment: Post-op Vital signs reviewed, Patient's Cardiovascular Status Stable, Respiratory Function Stable, Patent Airway and No signs of Nausea or vomiting  Last Vitals:  Filed Vitals:   07/04/13 1126  BP: 167/88  Pulse: 58  Temp: 36.6 C  Resp: 12    Post-op Vital Signs: stable   Complications: No apparent anesthesia complications

## 2013-07-04 NOTE — Progress Notes (Signed)
Utilization review completed.  

## 2013-07-04 NOTE — Evaluation (Signed)
Physical Therapy Evaluation Patient Details Name: RUBYANN LINGLE MRN: [id number] DOB: 1931-07-17 Today's Date: 07/04/2013 Time: 7829-5621 PT Time Calculation (min): 34 min  PT Assessment / Plan / Recommendation History of Present Illness     Clinical Impression  Pt s/p R TKR presents with decreased R LE strength/ROM and post op pain limiting functional mobility.    PT Assessment  Patient needs continued PT services    Follow Up Recommendations  Home health PT    Does the patient have the potential to tolerate intense rehabilitation      Barriers to Discharge        Equipment Recommendations  None recommended by PT    Recommendations for Other Services OT consult   Frequency 7X/week    Precautions / Restrictions Precautions Precautions: Knee;Fall Required Braces or Orthoses: Knee Immobilizer - Right Knee Immobilizer - Right: Discontinue once straight leg raise with < 10 degree lag Restrictions Weight Bearing Restrictions: No Other Position/Activity Restrictions: WBAT   Pertinent Vitals/Pain 5/10 at session end; Muscle relaxer requested, ice packs provided      Mobility  Bed Mobility Bed Mobility: Supine to Sit;Sit to Supine Supine to Sit: 1: +2 Total assist Supine to Sit: Patient Percentage: 40% Sit to Supine: 1: +2 Total assist Sit to Supine: Patient Percentage: 20% Details for Bed Mobility Assistance: Cues for sequence and use of L LE and UEs to self assist.  Physical assist to manage L LE and to bring trunk to upright position. Transfers Transfers: Not assessed (Pt c/o head spinning in sitting and "like I'm going to pass ) Ambulation/Gait Ambulation/Gait Assistance: Not tested (comment)    Exercises Total Joint Exercises Ankle Circles/Pumps: AROM;Both;10 reps;Supine Heel Slides: AAROM;5 reps;Supine;Right Straight Leg Raises: AAROM;5 reps;Right;Supine   PT Diagnosis: Difficulty walking  PT Problem List: Decreased strength;Decreased range of  motion;Decreased activity tolerance;Decreased mobility;Decreased knowledge of use of DME;Pain;Obesity PT Treatment Interventions: DME instruction;Gait training;Stair training;Functional mobility training;Therapeutic activities;Therapeutic exercise;Patient/family education     PT Goals(Current goals can be found in the care plan section) Acute Rehab PT Goals Patient Stated Goal: Resume previous lifestyle with decreased pain PT Goal Formulation: With patient Time For Goal Achievement: 07/11/13 Potential to Achieve Goals: Good  Visit Information  Last PT Received On: 07/04/13 Assistance Needed: +2       Prior Functioning  Home Living Family/patient expects to be discharged to:: Private residence Living Arrangements: Spouse/significant other Available Help at Discharge: Family Type of Home: House Home Access: Stairs to enter Secretary/administrator of Steps: 4 Entrance Stairs-Rails: Right;Left;Can reach both Home Layout: Two level Alternate Level Stairs-Number of Steps: 4 Alternate Level Stairs-Rails: Right;Left;Can reach both Home Equipment: Walker - 2 wheels;Bedside commode Additional Comments: Pt states has chair lift to landing and 4 stairs to climb to 2nd floor Prior Function Level of Independence: Independent Communication Communication: No difficulties Dominant Hand: Right    Cognition  Cognition Arousal/Alertness: Awake/alert Behavior During Therapy: WFL for tasks assessed/performed Overall Cognitive Status: Within Functional Limits for tasks assessed    Extremity/Trunk Assessment Upper Extremity Assessment Upper Extremity Assessment: Overall WFL for tasks assessed Lower Extremity Assessment Lower Extremity Assessment: RLE deficits/detail RLE Deficits / Details: 2+/5 quads with AAROM at knee -10 - 20 PAIN ltd RLE: Unable to fully assess due to pain   Balance Static Sitting Balance Static Sitting - Balance Support: Right upper extremity supported;Left upper  extremity supported Static Sitting - Level of Assistance: 3: Mod assist Static Sitting - Comment/# of Minutes: 5  End of Session  PT - End of Session Equipment Utilized During Treatment: Right knee immobilizer Activity Tolerance: Patient limited by pain;Other (comment) (dizziness) Patient left: in bed;with call bell/phone within reach;with family/visitor present Nurse Communication: Mobility status;Patient requests pain meds  GP     Koleen Celia 07/04/2013, 6:00 PM

## 2013-07-05 LAB — CBC
HCT: 26.3 % — ABNORMAL LOW (ref 36.0–46.0)
Hemoglobin: 8.6 g/dL — ABNORMAL LOW (ref 12.0–15.0)
MCH: 30.1 pg (ref 26.0–34.0)
MCV: 92 fL (ref 78.0–100.0)
Platelets: 135 10*3/uL — ABNORMAL LOW (ref 150–400)
RBC: 2.86 MIL/uL — ABNORMAL LOW (ref 3.87–5.11)
WBC: 7.4 10*3/uL (ref 4.0–10.5)

## 2013-07-05 LAB — BASIC METABOLIC PANEL
BUN: 10 mg/dL (ref 6–23)
CO2: 29 mEq/L (ref 19–32)
Calcium: 8.3 mg/dL — ABNORMAL LOW (ref 8.4–10.5)
Chloride: 102 mEq/L (ref 96–112)
Creatinine, Ser: 0.65 mg/dL (ref 0.50–1.10)
Glucose, Bld: 144 mg/dL — ABNORMAL HIGH (ref 70–99)

## 2013-07-05 NOTE — Progress Notes (Signed)
Physical Therapy Treatment Patient Details Name: [name] MRN: [id number] DOB: 1931/04/04 Today's Date: 07/05/2013 Time: 4098-1191 PT Time Calculation (min): 42 min  PT Assessment / Plan / Recommendation  PT Comments     Follow Up Recommendations  Home health PT     Does the patient have the potential to tolerate intense rehabilitation     Barriers to Discharge        Equipment Recommendations  None recommended by PT    Recommendations for Other Services OT consult  Frequency 7X/week   Progress towards PT Goals Progress towards PT goals: Progressing toward goals  Plan Current plan remains appropriate    Precautions / Restrictions Precautions Precautions: Knee;Fall Required Braces or Orthoses: Knee Immobilizer - Right Knee Immobilizer - Right: Discontinue once straight leg raise with < 10 degree lag Restrictions Weight Bearing Restrictions: No Other Position/Activity Restrictions: WBAT   Pertinent Vitals/Pain 5/10; premed, cold packs provided    Mobility  Bed Mobility Bed Mobility: Supine to Sit Supine to Sit: 1: +2 Total assist Supine to Sit: Patient Percentage: 50% Details for Bed Mobility Assistance: Cues for sequence and use of L LE and UEs to self assist.  Physical assist to manage L LE and to bring trunk to upright position. Transfers Transfers: Sit to Stand;Stand to Sit Sit to Stand: 1: +2 Total assist Sit to Stand: Patient Percentage: 60% Stand to Sit: 1: +2 Total assist Stand to Sit: Patient Percentage: 60% Details for Transfer Assistance: cues for LE management and use of UEs to self assist Ambulation/Gait Ambulation/Gait Assistance: 1: +2 Total assist Ambulation/Gait: Patient Percentage: 70% Ambulation Distance (Feet): 20 Feet Assistive device: Rolling walker Ambulation/Gait Assistance Details: cues for posture, sequence, stride length and position from RW Gait Pattern: Step-to pattern;Decreased step length - right;Decreased step length -  left;Decreased stance time - right;Antalgic;Trunk flexed    Exercises Total Joint Exercises Ankle Circles/Pumps: AROM;Both;Supine;20 reps;AAROM Quad Sets: AROM;Both;10 reps;Supine Heel Slides: AAROM;Supine;Right;10 reps Straight Leg Raises: AAROM;Right;Supine;10 reps   PT Diagnosis:    PT Problem List:   PT Treatment Interventions:     PT Goals (current goals can now be found in the care plan section) Acute Rehab PT Goals Patient Stated Goal: Resume previous lifestyle with decreased pain PT Goal Formulation: With patient Time For Goal Achievement: 07/11/13 Potential to Achieve Goals: Good  Visit Information  Last PT Received On: 07/05/13 Assistance Needed: +2    Subjective Data  Subjective: It just hurts so bad, I'm not behind am I? Patient Stated Goal: Resume previous lifestyle with decreased pain   Cognition  Cognition Arousal/Alertness: Awake/alert Behavior During Therapy: WFL for tasks assessed/performed Overall Cognitive Status: Within Functional Limits for tasks assessed    Balance  Static Sitting Balance Static Sitting - Balance Support: Right upper extremity supported;Left upper extremity supported Static Sitting - Level of Assistance: 5: Stand by assistance Static Sitting - Comment/# of Minutes: 4  End of Session PT - End of Session Equipment Utilized During Treatment: Right knee immobilizer;Gait belt Activity Tolerance: Patient limited by pain;Patient limited by fatigue Patient left: in chair;with call bell/phone within reach Nurse Communication: Mobility status;Patient requests pain meds   GP     [name] 07/05/2013, 12:29 PM

## 2013-07-05 NOTE — Progress Notes (Signed)
Subjective: 1 Day Post-Op Procedure(s) (LRB): RIGHT TOTAL KNEE ARTHROPLASTY (Right) Patient reports pain as moderate.    Objective: Vital signs in last 24 hours: Temp:  [97.5 F (36.4 C)-99.2 F (37.3 C)] 98.4 F (36.9 C) (07/12 0546) Pulse Rate:  [48-69] 69 (07/12 0546) Resp:  [12-16] 16 (07/12 0546) BP: (119-167)/(46-88) 134/80 mmHg (07/12 0546) SpO2:  [100 %] 100 % (07/12 0546) Weight:  [87.998 kg (194 lb)] 87.998 kg (194 lb) (07/11 1125)  Intake/Output from previous day: 07/11 0701 - 07/12 0700 In: 5651.3 [P.O.:120; I.V.:5376.3; IV Piggyback:155] Out: 2350 [Urine:1330; Emesis/NG output:200; Drains:820] Intake/Output this shift: Total I/O In: 120 [P.O.:120] Out: -    Recent Labs  07/05/13 0720  HGB 8.6*    Recent Labs  07/05/13 0720  WBC 7.4  RBC 2.86*  HCT 26.3*  PLT 135*    Recent Labs  07/05/13 0720  NA 136  K 3.1*  CL 102  CO2 29  BUN 10  CREATININE 0.65  GLUCOSE 144*  CALCIUM 8.3*   No results found for this basename: LABPT, INR,  in the last 72 hours  Neurologically intact  LL equal  Assessment/Plan: 1 Day Post-Op Procedure(s) (LRB): RIGHT TOTAL KNEE ARTHROPLASTY (Right) Up with therapy walking in halls. Will D/C drain today  YATES,MARK C 07/05/2013, 9:32 AM

## 2013-07-05 NOTE — Progress Notes (Signed)
Physical Therapy Treatment Patient Details Name: [name] MRN: [id number] DOB: May 12, 1931 Today's Date: 07/05/2013 Time: 4098-1191 PT Time Calculation (min): 39 min  PT Assessment / Plan / Recommendation  PT Comments   Pt with c/o "head spinning" with ambulation.  BP 116/71; HR 106, O2 98%.  Follow Up Recommendations  Home health PT     Does the patient have the potential to tolerate intense rehabilitation     Barriers to Discharge        Equipment Recommendations  None recommended by PT    Recommendations for Other Services OT consult  Frequency 7X/week   Progress towards PT Goals Progress towards PT goals: Progressing toward goals  Plan Current plan remains appropriate    Precautions / Restrictions Precautions Precautions: Knee;Fall Required Braces or Orthoses: Knee Immobilizer - Right Knee Immobilizer - Right: Discontinue once straight leg raise with < 10 degree lag Restrictions Weight Bearing Restrictions: No Other Position/Activity Restrictions: WBAT   Pertinent Vitals/Pain 3/10; premed,     Mobility  Bed Mobility Bed Mobility: Supine to Sit;Sit to Supine Supine to Sit: 3: Mod assist Sit to Supine: 1: +2 Total assist Sit to Supine: Patient Percentage: 50% Details for Bed Mobility Assistance: Pt rolled to L side utilizing rail to exit bed  Transfers Transfers: Sit to Stand;Stand to Sit Sit to Stand: From bed;From chair/3-in-1;With upper extremity assist;3: Mod assist Stand to Sit: To bed;To chair/3-in-1;With upper extremity assist;3: Mod assist Details for Transfer Assistance: cues for LE management and use of UEs to self assist Ambulation/Gait Ambulation/Gait Assistance: 1: +2 Total assist Ambulation/Gait: Patient Percentage: 70% Ambulation Distance (Feet): 20 Feet (and 5') Assistive device: Rolling walker Ambulation/Gait Assistance Details: cues for posture, stride length, sequence, position from RW and increased UE WB to allow advancing of L LE Gait  Pattern: Step-to pattern;Decreased step length - right;Decreased step length - left;Decreased stance time - right;Antalgic;Trunk flexed Stairs: No    Exercises     PT Diagnosis:    PT Problem List:   PT Treatment Interventions:     PT Goals (current goals can now be found in the care plan section) Acute Rehab PT Goals Patient Stated Goal: Resume previous lifestyle with decreased pain PT Goal Formulation: With patient Time For Goal Achievement: 07/11/13 Potential to Achieve Goals: Good  Visit Information  Last PT Received On: 07/05/13 Assistance Needed: +2    Subjective Data  Patient Stated Goal: Resume previous lifestyle with decreased pain   Cognition  Cognition Arousal/Alertness: Awake/alert Behavior During Therapy: WFL for tasks assessed/performed Overall Cognitive Status: Within Functional Limits for tasks assessed    Balance     End of Session PT - End of Session Equipment Utilized During Treatment: Right knee immobilizer;Gait belt Activity Tolerance: Patient limited by fatigue;Other (comment) (c/o dizziness) Patient left: in bed;with call bell/phone within reach Nurse Communication: Mobility status   GP     [name] 07/05/2013, 5:04 PM

## 2013-07-05 NOTE — Evaluation (Signed)
Occupational Therapy Evaluation Patient Details Name: [name] MRN: [id number] DOB: [birth year] Today's Date: 07/05/2013 Time: 1478-2956 OT Time Calculation (min): 32 min  OT Assessment / Plan / Recommendation History of present illness  79 y.o. Female admitted for Rt. TKA   Clinical Impression   Pt is s/p TKA resulting in the deficits listed below (see OT Problem List).  Pt will benefit from skilled OT to increase their safety and independence with ADL and functional mobility for ADL to facilitate discharge to venue listed below. Pt with good support from husband and hired caregiver.         OT Assessment  Patient needs continued OT Services    Follow Up Recommendations  No OT follow up;Supervision/Assistance - 24 hour    Barriers to Discharge      Equipment Recommendations  3 in 1 bedside comode    Recommendations for Other Services    Frequency  Min 2X/week    Precautions / Restrictions Precautions Precautions: Knee;Fall Required Braces or Orthoses: Knee Immobilizer - Right Knee Immobilizer - Right: Discontinue once straight leg raise with < 10 degree lag Restrictions Weight Bearing Restrictions: No Other Position/Activity Restrictions: WBAT       ADL  Eating/Feeding: Independent Where Assessed - Eating/Feeding: Chair Grooming: Wash/dry hands;Wash/dry face;Teeth care;Denture care;Set up Where Assessed - Grooming: Unsupported sitting Upper Body Bathing: Set up Where Assessed - Upper Body Bathing: Supported sitting Lower Body Bathing: Maximal assistance Where Assessed - Lower Body Bathing: Supported sit to stand Upper Body Dressing: Set up Where Assessed - Upper Body Dressing: Unsupported sitting Lower Body Dressing: +1 Total assistance Where Assessed - Lower Body Dressing: Supported sit to Art therapist Transfer: +2 Total assistance Toilet Transfer: Patient Percentage: 60% Toilet Transfer Method: Sit to stand;Stand pivot Acupuncturist: Raised  toilet seat with arms (or 3-in-1 over toilet);Bedside commode Toileting - Clothing Manipulation and Hygiene: Maximal assistance Where Assessed - Toileting Clothing Manipulation and Hygiene: Standing Equipment Used: Rolling walker Transfers/Ambulation Related to ADLs: mod A sit to stand; total A +2 (pt ~70%) ambulation ADL Comments: Pt unable to access for for LB ADLs, but may be able to progress to that as knee flexion improves.  She will have assistance at home until she is able to perform indpenedently     OT Diagnosis: Generalized weakness;Acute pain  OT Problem List: Decreased strength;Decreased activity tolerance;Decreased knowledge of use of DME or AE;Decreased knowledge of precautions;Obesity;Pain OT Treatment Interventions: Self-care/ADL training;Therapeutic activities;Patient/family education;DME and/or AE instruction   OT Goals(Current goals can be found in the care plan section) Acute Rehab OT Goals Patient Stated Goal: Resume previous lifestyle with decreased pain OT Goal Formulation: With patient/family Time For Goal Achievement: 07/12/13 Potential to Achieve Goals: Good ADL Goals Pt Will Perform Grooming: with min assist;standing Pt Will Perform Lower Body Bathing: with min assist;with adaptive equipment;sit to/from stand Pt Will Perform Lower Body Dressing: with min assist;with adaptive equipment;sit to/from stand Pt Will Transfer to Toilet: with min assist;regular height toilet;bedside commode;ambulating Pt Will Perform Toileting - Clothing Manipulation and hygiene: with min assist;sit to/from stand Pt Will Perform Tub/Shower Transfer: with min assist;ambulating;3 in 1;rolling walker  Visit Information  Last OT Received On: 07/05/13 Assistance Needed: +2       Prior Functioning     Home Living Family/patient expects to be discharged to:: Private residence Living Arrangements: Spouse/significant other;Non-relatives/Friends;Other (Comment) (has hired caregiver  available) Available Help at Discharge: Family Type of Home: House Home Access: Stairs to enter [employer] of  Steps: 4 Entrance Stairs-Rails: Right;Left;Can reach both Home Layout: Two level Alternate Level Stairs-Number of Steps: 4 Alternate Level Stairs-Rails: Right;Left;Can reach both Home Equipment: Walker - 2 wheels;Bedside commode Additional Comments: Pt states has chair lift to landing and 4 stairs to climb to 2nd floor Prior Function Level of Independence: Needs assistance ADL's / Homemaking Assistance Needed: required assistance with socks Communication Communication: No difficulties Dominant Hand: Right         Vision/Perception     Cognition  Cognition Arousal/Alertness: Awake/alert Behavior During Therapy: WFL for tasks assessed/performed Overall Cognitive Status: Within Functional Limits for tasks assessed    Extremity/Trunk Assessment Upper Extremity Assessment Upper Extremity Assessment: Overall WFL for tasks assessed Lower Extremity Assessment Lower Extremity Assessment: Defer to PT evaluation     Mobility Bed Mobility Bed Mobility: Not assessed Supine to Sit: 1: +2 Total assist Supine to Sit: Patient Percentage: 50% Details for Bed Mobility Assistance: Cues for sequence and use of L LE and UEs to self assist.  Physical assist to manage L LE and to bring trunk to upright position. Transfers Transfers: Sit to Stand;Stand to Sit Sit to Stand: 3: Mod assist;From chair/3-in-1 Sit to Stand: Patient Percentage: 60% Stand to Sit: 3: Mod assist;To chair/3-in-1 Stand to Sit: Patient Percentage: 60% Details for Transfer Assistance: cues for LE management and use of UEs to self assist     Exercise Total Joint Exercises Ankle Circles/Pumps: AROM;Both;Supine;20 reps;AAROM Quad Sets: AROM;Both;10 reps;Supine Heel Slides: AAROM;Supine;Right;10 reps Straight Leg Raises: AAROM;Right;Supine;10 reps   Balance Static Sitting Balance Static Sitting -  Balance Support: Right upper extremity supported;Left upper extremity supported Static Sitting - Level of Assistance: 5: Stand by assistance Static Sitting - Comment/# of Minutes: 4   End of Session OT - End of Session Equipment Utilized During Treatment: Rolling walker;Right knee immobilizer Activity Tolerance: Patient tolerated treatment well Patient left: in chair;with call bell/phone within reach;with family/visitor present CPM Right Knee CPM Right Knee: Off  GO     Jeani Hawking M 07/05/2013, 12:46 PM

## 2013-07-06 LAB — CBC
HCT: 22.6 % — ABNORMAL LOW (ref 36.0–46.0)
MCH: 31.1 pg (ref 26.0–34.0)
MCV: 90 fL (ref 78.0–100.0)
Platelets: 115 10*3/uL — ABNORMAL LOW (ref 150–400)
RDW: 13.2 % (ref 11.5–15.5)

## 2013-07-06 NOTE — Progress Notes (Signed)
Physical Therapy Treatment Patient Details Name: [name] MRN: [id number] DOB: 07-Aug-1931 Today's Date: 07/06/2013 Time: 1324-4010 PT Time Calculation (min): 26 min  PT Assessment / Plan / Recommendation  PT Comments   Progressing with mobility.   Follow Up Recommendations  Home health PT     Does the patient have the potential to tolerate intense rehabilitation     Barriers to Discharge        Equipment Recommendations  None recommended by PT    Recommendations for Other Services OT consult  Frequency 7X/week   Progress towards PT Goals Progress towards PT goals: Progressing toward goals  Plan Current plan remains appropriate    Precautions / Restrictions Precautions Precautions: Knee;Fall Required Braces or Orthoses: Knee Immobilizer - Right Restrictions Weight Bearing Restrictions: No Other Position/Activity Restrictions: WBAT   Pertinent Vitals/Pain 7/10 R LE- below knee area anterior "burning"    Mobility  Bed Mobility Bed Mobility: Supine to Sit Supine to Sit: 3: Mod assist Details for Bed Mobility Assistance: Assist for trunk to uprght and R LE off bed. Increased time. Heavy reliance on rail.  Transfers Transfers: Sit to Stand;Stand to Sit Sit to Stand: 3: Mod assist;From chair/3-in-1;From bed Stand to Sit: 3: Mod assist;To chair/3-in-1 Details for Transfer Assistance: x2. Assist to rise, stabilize, control descent. Slow gait speed.  Ambulation/Gait Ambulation/Gait Assistance: 4: Min assist Ambulation Distance (Feet): 55 Feet (15'x1, 55' x1) Assistive device: Rolling walker Ambulation/Gait Assistance Details: VCs safety, step length. Assist to stabilize throughout ambulation.  Gait Pattern: Antalgic;Decreased stride length;Step-to pattern;Decreased step length - right;Decreased step length - left;Trunk flexed    Exercises     PT Diagnosis:    PT Problem List:   PT Treatment Interventions:     PT Goals (current goals can now be found in the care  plan section)    Visit Information  Last PT Received On: 07/06/13 Assistance Needed: +2 History of Present Illness: 79 y.o. female admitted for Rt. TKA    Subjective Data      Cognition  Cognition Arousal/Alertness: Awake/alert Behavior During Therapy: WFL for tasks assessed/performed Overall Cognitive Status: Within Functional Limits for tasks assessed    Balance     End of Session PT - End of Session Equipment Utilized During Treatment: Right knee immobilizer Activity Tolerance: Patient tolerated treatment well Patient left: in chair;with call bell/phone within reach   GP     [name], MPT Pager: [phone]

## 2013-07-06 NOTE — Plan of Care (Signed)
Problem: Phase III Progression Outcomes Goal: Anticoagulant follow-up in place Outcome: Not Applicable Date Met:  07/06/13 xarelto

## 2013-07-06 NOTE — Progress Notes (Signed)
Subjective: 2 Days Post-Op Procedure(s) (LRB): RIGHT TOTAL KNEE ARTHROPLASTY (Right) Patient reports pain as mild.    Objective: Vital signs in last 24 hours: Temp:  [98.6 F (37 C)-99.3 F (37.4 C)] 99.3 F (37.4 C) (07/13 0530) Pulse Rate:  [79-86] 84 (07/13 0530) Resp:  [16-20] 20 (07/13 0530) BP: (104-162)/(63-79) 104/63 mmHg (07/13 0530) SpO2:  [96 %-100 %] 97 % (07/13 0530)  Intake/Output from previous day: 07/12 0701 - 07/13 0700 In: 775.8 [P.O.:180; I.V.:595.8] Out: 1200 [Urine:1200] Intake/Output this shift:     Recent Labs  07/05/13 0720 07/06/13 0505  HGB 8.6* 7.8*    Recent Labs  07/05/13 0720 07/06/13 0505  WBC 7.4 7.1  RBC 2.86* 2.51*  HCT 26.3* 22.6*  PLT 135* 115*    Recent Labs  07/05/13 0720  NA 136  K 3.1*  CL 102  CO2 29  BUN 10  CREATININE 0.65  GLUCOSE 144*  CALCIUM 8.3*   No results found for this basename: LABPT, INR,  in the last 72 hours  Neurologically intact  Assessment/Plan: 2 Days Post-Op Procedure(s) (LRB): RIGHT TOTAL KNEE ARTHROPLASTY (Right) Up with therapy  D/c IV .  Slow in firing quad   Spent time on isometric quad work.   Kiernan Atkerson C 07/06/2013, 9:29 AM

## 2013-07-06 NOTE — Progress Notes (Signed)
Occupational Therapy Treatment Patient Details Name: [name] MRN: [id number] DOB: 03/05/1931 Today's Date: 07/06/2013 Time: 2841-3244 OT Time Calculation (min): 27 min  OT Assessment / Plan / Recommendation  OT comments    Follow Up Recommendations  No OT follow up;Supervision/Assistance - 24 hour    Barriers to Discharge       Equipment Recommendations  3 in 1 bedside comode    Recommendations for Other Services    Frequency Min 2X/week   Progress towards OT Goals Progress towards OT goals: Progressing toward goals  Plan Discharge plan remains appropriate    Precautions / Restrictions Precautions Precautions: Knee;Fall Required Braces or Orthoses: Knee Immobilizer - Right Knee Immobilizer - Right: Discontinue once straight leg raise with < 10 degree lag Restrictions Weight Bearing Restrictions: No Other Position/Activity Restrictions: WBAT   Pertinent Vitals/Pain     ADL  Lower Body Bathing: Moderate assistance Where Assessed - Lower Body Bathing: Supported sit to stand Lower Body Dressing: Moderate assistance Where Assessed - Lower Body Dressing: Supported sit to Pharmacist, hospital: Moderate assistance Toilet Transfer Method: Sit to stand;Stand pivot Toilet Transfer Equipment: Raised toilet seat with arms (or 3-in-1 over toilet);Bedside commode Equipment Used: Rolling walker Transfers/Ambulation Related to ADLs: Pt was mod A transfers, min A ambulation with RW in room this date.  ADL Comments: Pt very talkative and needs reassurance and redirection throughout session. LB self-care reviewed including AE. Pt states her husband will assist her at home as needed.          OT Goals(current goals can now be found in the care plan section)    Visit Information  Last OT Received On: 07/06/13 Assistance Needed: +1 History of Present Illness: 79 y.o. female admitted for Rt. TKA    Cognition  Cognition Arousal/Alertness: Awake/alert Behavior During Therapy:  WFL for tasks assessed/performed Overall Cognitive Status: Within Functional Limits for tasks assessed    Mobility   Transfers Sit to Stand: 3: Mod assist;From chair/3-in-1;From bed Stand to Sit: 3: Mod assist;To chair/3-in-1     End of Session OT - End of Session Equipment Utilized During Treatment: Rolling walker;Right knee immobilizer Activity Tolerance: Patient tolerated treatment well Patient left: in chair;with call bell/phone within reach  GO     [name] A 07/06/2013, 11:05 AM

## 2013-07-06 NOTE — Progress Notes (Signed)
Physical Therapy Treatment Patient Details Name: [name] MRN: [id number] DOB: 07/11/1931 Today's Date: 07/06/2013 Time: 7829-5621 PT Time Calculation (min): 42 min  PT Assessment / Plan / Recommendation  PT Comments   Progressing with mobility.   Follow Up Recommendations  Home health PT     Does the patient have the potential to tolerate intense rehabilitation     Barriers to Discharge        Equipment Recommendations  None recommended by PT    Recommendations for Other Services OT consult  Frequency 7X/week   Progress towards PT Goals Progress towards PT goals: Progressing toward goals  Plan Current plan remains appropriate    Precautions / Restrictions Precautions Precautions: Knee;Fall Required Braces or Orthoses: Knee Immobilizer - Right Knee Immobilizer - Right: Discontinue once straight leg raise with < 10 degree lag Restrictions Weight Bearing Restrictions: No Other Position/Activity Restrictions: WBAT   Pertinent Vitals/Pain 5/10 R knee with activity    Mobility  Bed Mobility Sit to Supine: 3: Mod assist Details for Bed Mobility Assistance: Assist for bil LEs onto bed.  Transfers Transfers: Sit to Stand;Stand to Sit Sit to Stand: 3: Mod assist;From chair/3-in-1 Stand to Sit: 4: Min assist;To bed Details for Transfer Assistance:  Assist to rise, stabilize, control descent. VCs safety, technique, hand placement Ambulation/Gait Ambulation/Gait Assistance: 4: Min assist Ambulation Distance (Feet): 65 Feet Assistive device: Rolling walker Ambulation/Gait Assistance Details: VCs safety, sequence, step length. Assist to stabilize throughout ambulation. 3 brief standing rest breaks.  Gait Pattern: Antalgic;Decreased stride length;Trunk flexed;Decreased step length - left;Decreased step length - right    Exercises Total Joint Exercises Ankle Circles/Pumps: AROM;Both;10 reps;Supine Quad Sets: AROM;Both;10 reps;Supine Heel Slides: AAROM;Right;10  reps;Supine Hip ABduction/ADduction: AAROM;Right;10 reps;Supine Straight Leg Raises: AAROM;Right;10 reps;Supine R knee ROM: 7-30 degrees supine   PT Diagnosis:    PT Problem List:   PT Treatment Interventions:     PT Goals (current goals can now be found in the care plan section)    Visit Information  Last PT Received On: 07/06/13 Assistance Needed: +1 History of Present Illness: 79 y.o. female admitted for Rt. TKA    Subjective Data      Cognition  Cognition Arousal/Alertness: Awake/alert Behavior During Therapy: WFL for tasks assessed/performed Overall Cognitive Status: Within Functional Limits for tasks assessed    Balance     End of Session PT - End of Session Equipment Utilized During Treatment: Gait belt Activity Tolerance: Patient tolerated treatment well Patient left: in bed;with call bell/phone within reach   GP     [name], MPT Pager: [phone]

## 2013-07-07 ENCOUNTER — Encounter (HOSPITAL_COMMUNITY): Payer: Self-pay | Admitting: Orthopaedic Surgery

## 2013-07-07 LAB — PREPARE RBC (CROSSMATCH)

## 2013-07-07 LAB — CBC
Hemoglobin: 7.2 g/dL — ABNORMAL LOW (ref 12.0–15.0)
MCH: 30 pg (ref 26.0–34.0)
MCHC: 33.2 g/dL (ref 30.0–36.0)
RDW: 12.9 % (ref 11.5–15.5)

## 2013-07-07 MED ORDER — FUROSEMIDE 10 MG/ML IJ SOLN
20.0000 mg | Freq: Once | INTRAMUSCULAR | Status: AC
  Administered 2013-07-07: 20 mg via INTRAVENOUS
  Filled 2013-07-07: qty 2

## 2013-07-07 NOTE — Progress Notes (Signed)
Physical Therapy Treatment Patient Details Name: [name] MRN: [id number] DOB: 07-12-1931 Today's Date: 07/07/2013 Time: 4098-1191 PT Time Calculation (min): 13 min  PT Assessment / Plan / Recommendation  PT Comments   Bed exercises only this am. Hgb low and pt to receive blood this afternoon. Will check back later this afternoon after 1st unit to attempt OOB/ambulation.   Follow Up Recommendations  Home health PT     Does the patient have the potential to tolerate intense rehabilitation     Barriers to Discharge        Equipment Recommendations  None recommended by PT    Recommendations for Other Services OT consult  Frequency 7X/week   Progress towards PT Goals Progress towards PT goals: Progressing toward goals  Plan Current plan remains appropriate    Precautions / Restrictions Precautions Precautions: Knee;Fall Required Braces or Orthoses: Knee Immobilizer - Right Knee Immobilizer - Right: Discontinue once straight leg raise with < 10 degree lag Restrictions Weight Bearing Restrictions: No Other Position/Activity Restrictions: WBAT   Pertinent Vitals/Pain r knee 5/10 with activity    Mobility  Bed Mobility Bed Mobility: Not assessed Transfers Transfers: Not assessed    Exercises Total Joint Exercises Ankle Circles/Pumps: AROM;Both;15 reps;Supine Quad Sets: AROM;Right;10 reps;Supine Short Arc Quad: AAROM;Right;10 reps;Supine Heel Slides: AAROM;Right;10 reps;Supine Hip ABduction/ADduction: AAROM;Right;10 reps;Supine Straight Leg Raises: AAROM;Right;10 reps;Supine Goniometric ROM: R knee: 7-35 degrees supine   PT Diagnosis:    PT Problem List:   PT Treatment Interventions:     PT Goals (current goals can now be found in the care plan section)    Visit Information  Last PT Received On: 07/07/13 Assistance Needed: +1 History of Present Illness: 79 y.o. female admitted for Rt. TKA    Subjective Data      Cognition  Cognition Arousal/Alertness:  Lethargic Behavior During Therapy: WFL for tasks assessed/performed Overall Cognitive Status: Within Functional Limits for tasks assessed    Balance     End of Session PT - End of Session Activity Tolerance: Patient limited by lethargy Patient left: in bed;with call bell/phone within reach   GP     [name], MPT Pager: [phone]

## 2013-07-07 NOTE — Progress Notes (Signed)
Occupational Therapy Treatment Patient Details Name: [name] MRN: [id number] DOB: 1931/04/22 Today's Date: 07/07/2013 Time: 6387-5643 OT Time Calculation (min): 25 min  OT Assessment / Plan / Recommendation  OT comments  Pt in bed getting blood, but pt wanted to discuss ADL activity- LB dressing and shower transfer  Follow Up Recommendations  Home health OT             Frequency Min 2X/week   Progress towards OT Goals Progress towards OT goals: Progressing toward goals  Plan Discharge plan remains appropriate;Discharge plan needs to be updated    Precautions / Restrictions Precautions Precautions: Knee;Fall Required Braces or Orthoses: Knee Immobilizer - Right Knee Immobilizer - Right: Discontinue once straight leg raise with < 10 degree lag Restrictions Weight Bearing Restrictions: No Other Position/Activity Restrictions: WBAT       ADL  ADL Comments: Pt in bed getting blood.  OT and pt spoke extensively regarding LB dressing and shower transfer.  OT demonstrated shower transfer.  Pt able to verbalize understanding. Pt reports husband will A with LB dressing as needed.  Pt reports she has a walker tray in which she will use for carrying items when she is walking .  Pt reports she carried coffee while using a cane.  Educated pt on keeping both hands on the walker at all times      OT Goals(current goals can now be found in the care plan section)    Visit Information  Last OT Received On: 07/07/13 Assistance Needed: +1 Reason Eval/Treat Not Completed: Other (comment) History of Present Illness: 79 y.o. female admitted for Rt. TKA          Cognition  Cognition Arousal/Alertness: Awake/alert Behavior During Therapy: WFL for tasks assessed/performed Overall Cognitive Status: Within Functional Limits for tasks assessed    Mobility  Bed Mobility Bed Mobility: Not assessed    Exercises  Total Joint Exercises Ankle Circles/Pumps: AROM;Both;15 reps;Supine Quad  Sets: AROM;Right;10 reps;Supine Short Arc Quad: AAROM;Right;10 reps;Supine Heel Slides: AAROM;Right;10 reps;Supine Hip ABduction/ADduction: AAROM;Right;10 reps;Supine Straight Leg Raises: AAROM;Right;10 reps;Supine Goniometric ROM: R knee: 7-35 degrees supine      End of Session OT - End of Session Activity Tolerance: Treatment limited secondary to medical complications (Comment);Other (comment) (pt getting blood) Patient left: in bed;with family/visitor present;with call bell/phone within reach  GO     [facility] [place] 07/07/2013, 3:32 PM

## 2013-07-07 NOTE — Progress Notes (Signed)
OT Cancellation Note  Patient Details Name: [name] MRN: [id number] DOB: 1931-02-19   Cancelled Treatment:    Reason Eval/Treat Not Completed: Other (comment) (pt getting ready to get blood. Will try back later time.)  [name] [phone] 07/07/2013, 12:31 PM

## 2013-07-07 NOTE — Progress Notes (Signed)
PT Cancellation Note-2nd session  Patient Details Name: [name] MRN: [id number] DOB: 06/11/1931   Cancelled Treatment:    Reason Eval/Treat Not Completed: Medical issues which prohibited therapy--1st unit of blood not finished yet. Will check back on tomorrow for OOB/ambulation.    [name], MPT Pager: [phone]

## 2013-07-07 NOTE — Progress Notes (Signed)
Subjective: 3 Days Post-Op Procedure(s) (LRB): RIGHT TOTAL KNEE ARTHROPLASTY (Right) Patient reports pain as moderate.  Slow mobility.  Acute blood loss anemia in someone with history of bradycardia.  Objective: Vital signs in last 24 hours: Temp:  [99.5 F (37.5 C)-99.7 F (37.6 C)] 99.5 F (37.5 C) (07/14 0450) Pulse Rate:  [76-77] 76 (07/14 0450) Resp:  [20] 20 (07/14 0450) BP: (101-141)/(41-69) 101/41 mmHg (07/14 0450) SpO2:  [92 %-99 %] 92 % (07/14 0450)  Intake/Output from previous day: 07/13 0701 - 07/14 0700 In: 360 [P.O.:360] Out: 800 [Urine:800] Intake/Output this shift:     Recent Labs  07/05/13 0720 07/06/13 0505 07/07/13 0447  HGB 8.6* 7.8* 7.2*    Recent Labs  07/06/13 0505 07/07/13 0447  WBC 7.1 6.7  RBC 2.51* 2.40*  HCT 22.6* 21.7*  PLT 115* 118*    Recent Labs  07/05/13 0720  NA 136  K 3.1*  CL 102  CO2 29  BUN 10  CREATININE 0.65  GLUCOSE 144*  CALCIUM 8.3*   No results found for this basename: LABPT, INR,  in the last 72 hours  Sensation intact distally Intact pulses distally Dorsiflexion/Plantar flexion intact Incision: dressing C/D/I Compartment soft  Assessment/Plan: 3 Days Post-Op Procedure(s) (LRB): RIGHT TOTAL KNEE ARTHROPLASTY (Right) Up with therapy Plan for discharge tomorrow Transfusion today as well.  [name] 07/07/2013, 7:32 AM

## 2013-07-08 LAB — TYPE AND SCREEN
ABO/RH(D): A POS
Antibody Screen: NEGATIVE
Unit division: 0

## 2013-07-08 LAB — CBC
HCT: 27 % — ABNORMAL LOW (ref 36.0–46.0)
Hemoglobin: 9.4 g/dL — ABNORMAL LOW (ref 12.0–15.0)
MCH: 31 pg (ref 26.0–34.0)
MCHC: 34.8 g/dL (ref 30.0–36.0)
MCV: 89.1 fL (ref 78.0–100.0)
RBC: 3.03 MIL/uL — ABNORMAL LOW (ref 3.87–5.11)

## 2013-07-08 MED ORDER — FERROUS GLUCONATE 216 MG PO TABS: 216.0000 mg | ORAL_TABLET | Freq: Three times a day (TID) | ORAL | Status: DC

## 2013-07-08 MED ORDER — OXYCODONE-ACETAMINOPHEN 5-325 MG PO TABS: 1.0000 | ORAL_TABLET | ORAL | Status: DC | PRN

## 2013-07-08 MED ORDER — ASPIRIN EC 325 MG PO TBEC: 325.0000 mg | DELAYED_RELEASE_TABLET | Freq: Two times a day (BID) | ORAL | Status: DC

## 2013-07-08 NOTE — Progress Notes (Signed)
Utilization review completed.  

## 2013-07-08 NOTE — Progress Notes (Signed)
Patient ID: [name], female   DOB: [birth year], 79 y.o.   MRN: [id number] Looks good.  AF/VSS.  Can d/c to home today.

## 2013-07-08 NOTE — Discharge Summary (Signed)
Patient ID: [name] MRN: [id number] DOB/AGE: [birth year] 79 y.o.  Admit date: 07/04/2013 Discharge date: 07/08/2013  Admission Diagnoses:  Principal Problem:   Arthritis of knee, right   Discharge Diagnoses:  Same  Past Medical History  Diagnosis Date  . Anxiety state, unspecified   . Backache, unspecified   . Diverticulosis of colon (without mention of hemorrhage)   . Stricture and stenosis of esophagus   . Esophageal reflux   . Enthesopathy of hip region   . Diaphragmatic hernia without mention of obstruction or gangrene   . Pain in joint, pelvic region and thigh   . Pure hypercholesterolemia   . Unspecified essential hypertension   . Elevated blood pressure reading without diagnosis of hypertension   . Pain in limb   . Other dysphagia   . Cramp of limb   . Chest pain, unspecified   . Other nonspecific abnormal serum enzyme levels   . Hepatitis, unspecified   . Disturbance of skin sensation   . Unspecified vitamin D deficiency   . Pain in joint, hand   . Other abnormal blood chemistry   . Pain in joint, ankle and foot   . Plantar fascial fibromatosis   . Unspecified hypothyroidism   . Asymptomatic varicose veins   . Allergic rhinitis due to pollen   . Myalgia and myositis, unspecified   . Pain in joint, lower leg   . Sciatica   . Reflux esophagitis   . Anxiety state, unspecified   . Depressive disorder, not elsewhere classified   . Other specified cardiac dysrhythmias(427.89)   . Other malaise and fatigue   . Insomnia, unspecified   . Carpal tunnel syndrome   . Spinal stenosis, unspecified region other than cervical   . Lumbago   . Symptomatic menopausal or female climacteric states   . Unspecified essential hypertension   . Headache(784.0)   . Other and unspecified hyperlipidemia   . Obesity, unspecified   . Diaphragmatic hernia without mention of obstruction or gangrene   . Peptic ulcer, unspecified site, unspecified as acute or chronic, without  mention of hemorrhage, perforation, or obstruction     Surgeries: Procedure(s): RIGHT TOTAL KNEE ARTHROPLASTY on 07/04/2013   Consultants:    Discharged Condition: Improved  Hospital Course: [name] is an 79 y.o. female who was admitted 07/04/2013 for operative treatment ofArthritis of knee, right. Patient has severe unremitting pain that affects sleep, daily activities, and work/hobbies. After pre-op clearance the patient was taken to the operating room on 07/04/2013 and underwent  Procedure(s): RIGHT TOTAL KNEE ARTHROPLASTY.    Patient was given perioperative antibiotics: Anti-infectives   Start     Dose/Rate Route Frequency Ordered Stop   07/04/13 1400  ceFAZolin (ANCEF) IVPB 1 g/50 mL premix     1 g 100 mL/hr over 30 Minutes Intravenous Every 6 hours 07/04/13 1136 07/04/13 2047   07/04/13 0603  ceFAZolin (ANCEF) IVPB 2 g/50 mL premix     2 g 100 mL/hr over 30 Minutes Intravenous On call to O.R. 07/04/13 0603 07/04/13 0740       Patient was given sequential compression devices, early ambulation, and chemoprophylaxis to prevent DVT.  Patient benefited maximally from hospital stay and there were no complications.    Recent vital signs: Patient Vitals for the past 24 hrs:  BP Temp Temp src Pulse Resp SpO2  07/08/13 0550 104/65 mmHg 98.7 F (37.1 C) Oral 71 16 94 %  07/08/13 0434 - - - 81 16 94 %  07/08/13 0019 - - - 76 15 95 %  07/07/13 2148 136/81 mmHg 98.5 F (36.9 C) Oral 80 16 96 %  07/07/13 2008 136/66 mmHg 98 F (36.7 C) Oral 88 16 96 %  07/07/13 1925 133/81 mmHg 98.3 F (36.8 C) Oral 82 16 95 %  07/07/13 1835 139/77 mmHg 98.7 F (37.1 C) Oral 82 16 -  07/07/13 1735 138/81 mmHg 99 F (37.2 C) Oral - 16 -  07/07/13 1700 133/75 mmHg 98.5 F (36.9 C) Oral 76 16 -  07/07/13 1615 117/75 mmHg 98.5 F (36.9 C) Oral 72 16 -  07/07/13 1515 114/57 mmHg 98.6 F (37 C) Oral 83 16 -  07/07/13 1430 134/82 mmHg 98.6 F (37 C) Oral 71 16 -  07/07/13 1415 139/84 mmHg 98.6  F (37 C) Oral 73 16 -  07/07/13 1330 148/79 mmHg 98.2 F (36.8 C) Oral 71 16 -  07/07/13 1300 132/77 mmHg 98.5 F (36.9 C) Oral 69 16 -  07/07/13 1200 - - - - 15 -  07/07/13 0800 - - - - 14 -     Recent laboratory studies:  Recent Labs  07/07/13 0447 07/08/13 0430  WBC 6.7 6.3  HGB 7.2* 9.4*  HCT 21.7* 27.0*  PLT 118* 156     Discharge Medications:     Medication List    STOP taking these medications       traMADol 50 MG tablet  Commonly known as:  ULTRAM     vitamin E 600 UNIT capsule      TAKE these medications       ALPRAZolam 0.25 MG tablet  Commonly known as:  XANAX  Take 0.25 mg by mouth daily as needed for anxiety.     aspirin EC 325 MG tablet  Take 1 tablet (325 mg total) by mouth 2 (two) times daily.     atorvastatin 10 MG tablet  Commonly known as:  LIPITOR  Take 10 mg by mouth every morning.     azelastine 0.05 % ophthalmic solution  Commonly known as:  OPTIVAR  1 drop 2 (two) times daily.     bimatoprost 0.01 % Soln  Commonly known as:  LUMIGAN  Instill 1 drop into both eyes once daily in the evening     diclofenac sodium 1 % Gel  Commonly known as:  VOLTAREN  Apply 10 g topically daily as needed (for knee).     dorzolamide 2 % ophthalmic solution  Commonly known as:  TRUSOPT  Instill 1 drop in each eye twice a day     ferrous gluconate 216 MG tablet  Commonly known as:  FERGON  Take 1 tablet (216 mg total) by mouth 3 (three) times daily with meals.     glucosamine-chondroitin 500-400 MG tablet  Take 1 tablet by mouth 2 (two) times daily.     methylcellulose 1 % ophthalmic solution  Commonly known as:  ARTIFICIAL TEARS  Place 1 drop into both eyes daily as needed (for dry eyes).     omeprazole 40 MG capsule  Commonly known as:  PRILOSEC  Take 40 mg by mouth daily as needed (for acid reflux).     oxyCODONE-acetaminophen 5-325 MG per tablet  Commonly known as:  ROXICET  Take 1-2 tablets by mouth every 4 (four) hours as needed  for pain.     TEARS AGAIN HYDRATE 1000-500-40 MG Caps  Generic drug:  Flaxseed-Eve Prim-Bilberry  Take 2 capsules by mouth 2 (two) times daily.  valsartan-hydrochlorothiazide 320-25 MG per tablet  Commonly known as:  DIOVAN-HCT  TAKE 1 TABLET EVERY DAY TO CONTROL BLOOD PRESSURE     Vitamin D 2000 UNITS tablet  Take 2,000 Units by mouth daily.     zolpidem 10 MG tablet  Commonly known as:  AMBIEN  Take 5 mg by mouth at bedtime as needed for sleep.        Diagnostic Studies: Dg Chest 2 View  06/26/2013   *RADIOLOGY REPORT*  Clinical Data: Preoperative evaluation for right total knee arthroplasty.  Dry cough today.  Hypertension.  Nonsmoker  CHEST - 2 VIEW  Comparison: 05/09/2004  Findings: Heart and mediastinal contours are within normal limits. The lung fields demonstrate an underlying pattern of mild increased interstitial markings compatible with mild bronchitic change.  This finding is stable.  No focal infiltrates or signs of congestive failure seen.  No pleural fluid or significant peribronchial cuffing is seen.  Bony structures demonstrate mild degenerative osteophytosis of the mid thoracic spine and are otherwise intact.  Surgical clips are seen in the right upper quadrant.  IMPRESSION: Stable cardiopulmonary appearance with no new focal or acute abnormality identified.   Original Report Authenticated By: Rhodia Albright, M.D.   Dg Knee Right Port  07/04/2013   *RADIOLOGY REPORT*  Clinical Data: Post total knee replacement  PORTABLE RIGHT KNEE - 1-2 VIEW  Comparison: None.  Findings: Two portable views of the right knee show the femoral and tibial components to be in good position and alignment postoperatively.  A drain is present, and there is air in the soft tissues and within the joint space.  No complications are noted.  IMPRESSION: Right total knee replacement components in good position and alignment with no complicating features noted.   Original Report Authenticated By: Dwyane Dee, M.D.    Disposition: To home      Discharge Orders   Future Appointments Provider Department Dept Phone   10/01/2013 4:00 PM Kimber Relic, MD [facility] [phone]   Future Orders Complete By Expires     Call MD / Call 911  As directed     Comments:      If you experience chest pain or shortness of breath, CALL 911 and be transported to the hospital emergency room.  If you develope a fever above 101 F, pus (white drainage) or increased drainage or redness at the wound, or calf pain, call your surgeon's office.    Constipation Prevention  As directed     Comments:      Drink plenty of fluids.  Prune juice may be helpful.  You may use a stool softener, such as Colace (over the counter) 100 mg twice a day.  Use MiraLax (over the counter) for constipation as needed.    Diet - low sodium heart healthy  As directed     Discharge instructions  As directed     Comments:      Increase your activities as comfort allows. You can get your current dressing wet in the shower. You can remove your current dressing this coming Friday and start getting your actual incision wet. Expect leg and foot swelling.    Discharge patient  As directed     Discharge wound care:  As directed     Comments:      Keep dressing clean dry and intact until Friday then remove dressing and shower. Apply clean dressing after showering    Increase activity slowly as tolerated  As directed  Weight bearing as tolerated  As directed        Follow-up Information   Schedule an appointment as soon as possible for a visit with Kathryne Hitch, MD.   Contact information:   [address] [place] [address] [phone]        Signed: Kathryne Hitch 07/08/2013, 7:43 AM

## 2013-07-08 NOTE — Care Management Note (Signed)
    Page 1 of 1   07/08/2013     7:39:35 PM   CARE MANAGEMENT NOTE 07/08/2013  Patient:  [name], [name]   Account Number:  [id number]  Date Initiated:  07/08/2013  Documentation initiated by:  Colleen Can  Subjective/Objective Assessment:   dx total knee replacemnt rt     Action/Plan:   Cm spoke with patient and spouse. Plans are for patient to return to her home in [place] where spouse will be caregiver. She already has RW, commode. Wishes to use [facility] for [facility] services.   Anticipated DC Date:  07/08/2013   Anticipated DC Plan:  HOME W HOME HEALTH SERVICES  In-house referral  Clinical Social Worker      DC Planning Services  CM consult      [facility] Choice  HOME HEALTH   Choice offered to / List presented to:  C-1 Patient        HH arranged  HH-2 PT      [facility] agency  [facility]   Status of service:  Completed, signed off Medicare Important Message given?   (If response is "NO", the following Medicare IM given date fields will be blank) Date Medicare IM given:   Date Additional Medicare IM given:    Discharge Disposition:  HOME W HOME HEALTH SERVICES  Per UR Regulation:    If discussed at Long Length of Stay Meetings, dates discussed:    Comments:

## 2013-07-08 NOTE — Progress Notes (Signed)
[facility]  [facility] is providing the following services: Patient declined commode - already has one at home.   If patient discharges after hours, please call [phone].   Renard Hamper [phone] 07/08/2013, 11:40 AM

## 2013-07-08 NOTE — Progress Notes (Signed)
Occupational Therapy Treatment Patient Details Name: [name] MRN: [id number] DOB: 02-09-1931 Today's Date: 07/08/2013 Time: 4098-1191 OT Time Calculation (min): 38 min  OT Assessment / Plan / Recommendation  OT comments  Continued education on AE and pt plans to purchase AE. Will benefit from continued practice/education with AE through [facility]. Pt able to practice shower transfer with husband present for education on all. Pt planning for d/c today.   Follow Up Recommendations  Home health OT;Supervision/Assistance - 24 hour    Barriers to Discharge       Equipment Recommendations  3 in 1 bedside comode    Recommendations for Other Services    Frequency Min 2X/week   Progress towards OT Goals Progress towards OT goals: Progressing toward goals  Plan Discharge plan remains appropriate    Precautions / Restrictions Precautions Precautions: Knee;Fall Required Braces or Orthoses: Knee Immobilizer - Right Knee Immobilizer - Right: Discontinue once straight leg raise with < 10 degree lag Restrictions Weight Bearing Restrictions: No Other Position/Activity Restrictions: WBAT   Pertinent Vitals/Pain Pt states "its ok"    ADL  Toilet Transfer: Performed;Minimal assistance Toilet Transfer Method: Other (comment) (with walker into bathroom to 3in1) Toilet Transfer Equipment: Raised toilet seat with arms (or 3-in-1 over toilet) Toileting - Clothing Manipulation and Hygiene: Simulated;Minimal assistance Where Assessed - Toileting Clothing Manipulation and Hygiene: Sit to stand from 3-in-1 or toilet Tub/Shower Transfer: Performed;Minimal assistance Tub/Shower Transfer Method:  (step back over ledge with walker, forwards to step out) Equipment Used: Rolling walker;Long-handled shoe horn;Long-handled sponge;Reacher;Sock aid ADL Comments: Demonstrated all AE and explained coverage to pt and spouse. They state they will likely get AE. Pt did well with shower transfer and husband  educated on how to stabilize RW as pt steps in and out of shower. Educated on need to wear KI until able to SLR including to step in and out of shower. Explained she could sponge bathe until SLR and not have to wear KI into and out of shower. Discussed safety recommendation of sitting on 3in1 as shower seat initially untl stronger to stand in shower.      OT Diagnosis:    OT Problem List:   OT Treatment Interventions:     OT Goals(current goals can now be found in the care plan section) Acute Rehab OT Goals Potential to Achieve Goals: Good  Visit Information  Last OT Received On: 07/08/13 Assistance Needed: +1 History of Present Illness: 79 y.o. female admitted for Rt. TKA    Subjective Data      Prior Functioning       Cognition  Cognition Arousal/Alertness: Awake/alert Behavior During Therapy: WFL for tasks assessed/performed Overall Cognitive Status: Within Functional Limits for tasks assessed    Mobility  Bed Mobility Supine to Sit: 4: Min assist;HOB elevated Details for Bed Mobility Assistance: min assist as R LE came off the bed to the floor. Pt able to self assist using L LE to get R LE over to EOB.  Transfers Transfers: Sit to Stand;Stand to Sit Sit to Stand: 4: Min assist;With upper extremity assist;From bed;From chair/3-in-1 Stand to Sit: 4: Min assist;With upper extremity assist;To chair/3-in-1 Details for Transfer Assistance: min verbal cues for hand placement and min assist to extend R LE out in front further to sit on commode    Exercises      Balance     End of Session OT - End of Session Equipment Utilized During Treatment: Gait belt Activity Tolerance: Patient tolerated treatment well Patient  left: in chair;with family/visitor present;Other (comment);with call bell/phone within reach (with PT)   GO     [name] [phone] 07/08/2013, 9:43 AM

## 2013-07-08 NOTE — Progress Notes (Signed)
Physical Therapy Treatment Patient Details Name: [name] MRN: [id number] DOB: [birth year] Today's Date: 07/08/2013 Time: 2956-2130 PT Time Calculation (min): 64 min  PT Assessment / Plan / Recommendation  PT Comments   Progressing with mobility. Plan is for d/c home on today. Practiced ambulation, stair negotiation, exercises. All education completed. Ready for d/c home from PT standpoint  Follow Up Recommendations  Home health PT;Supervision for mobility/OOB     Does the patient have the potential to tolerate intense rehabilitation     Barriers to Discharge        Equipment Recommendations  None recommended by PT    Recommendations for Other Services OT consult  Frequency 7X/week   Progress towards PT Goals Progress towards PT goals: Progressing toward goals  Plan Current plan remains appropriate    Precautions / Restrictions Precautions Precautions: Knee;Fall Required Braces or Orthoses: Knee Immobilizer - Right Knee Immobilizer - Right: Discontinue once straight leg raise with < 10 degree lag Restrictions Weight Bearing Restrictions: No Other Position/Activity Restrictions: WBAT   Pertinent Vitals/Pain 5/10 R knee with activity. Ice applied end of session. Encouraged pt to request pain meds prior to d/c.     Mobility  Bed Mobility Bed Mobility: Supine to Sit Supine to Sit: 4: Min assist Details for Bed Mobility Assistance: min assist as R LE came off the bed to the floor. Pt able to self assist using L LE to get R LE over to EOB.  Transfers Transfers: Sit to Stand;Stand to Sit Sit to Stand: 4: Min assist;From bed Stand to Sit: 4: Min assist;To chair/3-in-1;With armrests Details for Transfer Assistance: min verbal cues for hand placement and min assist to extend R LE out in front, assist to control descent Ambulation/Gait Ambulation/Gait Assistance: 4: Min guard Ambulation Distance (Feet): 67 Feet Assistive device: Rolling walker Gait Pattern: Step-to  pattern;Decreased stride length;Antalgic;Trunk flexed;Decreased step length - right Stairs: Yes Stairs Assistance Details (indicate cue type and reason): VCs safety, technique, sequence. Assist to stabilize. Husband present as well.  Stair Management Technique: One rail Right;With crutches;Forwards;Step to pattern Number of Stairs: 2    Exercises Total Joint Exercises Ankle Circles/Pumps: AROM;Both;15 reps;Seated Quad Sets: AROM;Right;15 reps;Seated Hip ABduction/ADduction: AAROM;Right;15 reps;Seated Straight Leg Raises: AAROM;Right;15 reps;Seated Goniometric ROM: R knee: 7 degrees ext supine, 49 degrees flex seated with overpressure   PT Diagnosis:    PT Problem List:   PT Treatment Interventions:     PT Goals (current goals can now be found in the care plan section)    Visit Information  Last PT Received On: 07/08/13 Assistance Needed: +1 History of Present Illness: 79 y.o. female admitted for Rt. TKA    Subjective Data      Cognition  Cognition Arousal/Alertness: Awake/alert Behavior During Therapy: WFL for tasks assessed/performed Overall Cognitive Status: Within Functional Limits for tasks assessed    Balance     End of Session PT - End of Session Equipment Utilized During Treatment: Gait belt Activity Tolerance: Patient tolerated treatment well Patient left: in chair;with call bell/phone within reach;with family/visitor present CPM Right Knee CPM Right Knee: Off Right Knee Flexion (Degrees): 60 (60)   GP     [name], MPT Pager: [phone]

## 2013-08-07 ENCOUNTER — Other Ambulatory Visit: Payer: Self-pay | Admitting: Internal Medicine

## 2013-09-23 ENCOUNTER — Ambulatory Visit (INDEPENDENT_AMBULATORY_CARE_PROVIDER_SITE_OTHER): Payer: Medicare Other

## 2013-09-23 DIAGNOSIS — Z23 Encounter for immunization: Secondary | ICD-10-CM

## 2013-10-01 ENCOUNTER — Ambulatory Visit (INDEPENDENT_AMBULATORY_CARE_PROVIDER_SITE_OTHER): Payer: Medicare Other | Admitting: Internal Medicine

## 2013-10-01 ENCOUNTER — Encounter: Payer: Self-pay | Admitting: Internal Medicine

## 2013-10-01 VITALS — BP 126/78 | HR 58 | Wt 184.0 lb

## 2013-10-01 DIAGNOSIS — Z96659 Presence of unspecified artificial knee joint: Secondary | ICD-10-CM

## 2013-10-01 DIAGNOSIS — M79609 Pain in unspecified limb: Secondary | ICD-10-CM

## 2013-10-01 DIAGNOSIS — I10 Essential (primary) hypertension: Secondary | ICD-10-CM

## 2013-10-01 DIAGNOSIS — D62 Acute posthemorrhagic anemia: Secondary | ICD-10-CM | POA: Insufficient documentation

## 2013-10-01 DIAGNOSIS — L299 Pruritus, unspecified: Secondary | ICD-10-CM

## 2013-10-01 DIAGNOSIS — G4700 Insomnia, unspecified: Secondary | ICD-10-CM

## 2013-10-01 DIAGNOSIS — F411 Generalized anxiety disorder: Secondary | ICD-10-CM

## 2013-10-01 DIAGNOSIS — M62838 Other muscle spasm: Secondary | ICD-10-CM

## 2013-10-01 DIAGNOSIS — R609 Edema, unspecified: Secondary | ICD-10-CM

## 2013-10-01 DIAGNOSIS — E7800 Pure hypercholesterolemia, unspecified: Secondary | ICD-10-CM

## 2013-10-01 MED ORDER — BETAMETHASONE VALERATE 0.1 % EX OINT: TOPICAL_OINTMENT | CUTANEOUS | Status: DC

## 2013-10-01 MED ORDER — ALPRAZOLAM 0.25 MG PO TABS: ORAL_TABLET | ORAL | Status: DC

## 2013-10-01 MED ORDER — ZOLPIDEM TARTRATE 10 MG PO TABS: 5.0000 mg | ORAL_TABLET | Freq: Every evening | ORAL | Status: DC | PRN

## 2013-10-01 NOTE — Progress Notes (Signed)
Subjective:    Patient ID: [name], female    DOB: 1931/05/18, 79 y.o.   MRN: [id number]   Chief Complaint  Patient presents with  . Medical Managment of Chronic Issues    3 month follow-up   . Medication Refill    Ambien and Xanax  . Medication Management    Request new RX for Betamethasone 0.1 % ointment     HPI Acute blood loss anemia: patient had right TKR in July 2014 by Dr. Magnus Ivan  Knee joint replacement by other means: Done July 2014 by Dr. Magnus Ivan  Pruritus; chronic issue previously treated with betamethasone valerate ointment (VALISONE) 0.1 %  Insomnia, unspecified: zolpidem (AMBIEN) 10 MG tablet helps  Anxiety state, unspecified : ALPRAZolam (XANAX) 0.25 MG tablet helps  HYPERCHOLESTEROLEMIA - Plan: Lipid panel  HYPERTENSION: Controlled  Edema: Stable  LEG PAIN, RIGHT: Normal expectations following right TKR. No inflammation or cellulitis.  Muscle spasm: Improving. Was brought on by previous knee surgery.    Current Outpatient Prescriptions on File Prior to Visit  Medication Sig Dispense Refill  . ALPRAZolam (XANAX) 0.25 MG tablet TAKE 1 TABLET AT BEDTIME AS NEEDED FOR ANXIETY  30 tablet  1  . atorvastatin (LIPITOR) 10 MG tablet Take 10 mg by mouth every morning.       Marland Kitchen azelastine (OPTIVAR) 0.05 % ophthalmic solution 1 drop 2 (two) times daily.      . bimatoprost (LUMIGAN) 0.01 % SOLN Instill 1 drop into both eyes once daily in the evening      . Cholecalciferol (VITAMIN D) 2000 UNITS tablet Take 2,000 Units by mouth daily.      . diclofenac sodium (VOLTAREN) 1 % GEL Apply 10 g topically daily as needed (for knee).       . dorzolamide (TRUSOPT) 2 % ophthalmic solution Instill 1 drop in each eye twice a day      . glucosamine-chondroitin 500-400 MG tablet Take 1 tablet by mouth 2 (two) times daily.      . methylcellulose (ARTIFICIAL TEARS) 1 % ophthalmic solution Place 1 drop into both eyes daily as needed (for dry eyes).       Marland Kitchen omeprazole  (PRILOSEC) 40 MG capsule Take 40 mg by mouth daily as needed (for acid reflux).       Marland Kitchen oxyCODONE-acetaminophen (ROXICET) 5-325 MG per tablet Take 1-2 tablets by mouth every 4 (four) hours as needed for pain.  60 tablet  0  . valsartan-hydrochlorothiazide (DIOVAN-HCT) 320-25 MG per tablet TAKE 1 TABLET EVERY DAY TO CONTROL BLOOD PRESSURE  30 tablet  4  . zolpidem (AMBIEN) 10 MG tablet Take 5 mg by mouth at bedtime as needed for sleep.        No current facility-administered medications on file prior to visit.    Review of Systems  Constitutional: Positive for activity change. Negative for fever, chills, diaphoresis, appetite change, fatigue and unexpected weight change.  HENT: Negative.   Eyes: Positive for discharge.       Corrective lenses.   Respiratory: Negative.   Cardiovascular: Positive for leg swelling. Negative for chest pain and palpitations.  Gastrointestinal:       Frequent reflux and heartburn.  Endocrine:       History of elevations in glucose. Diet controlled.  Genitourinary: Positive for frequency.       Urinary leakage,  Musculoskeletal:       Chronic back pains. Right knee painful. Had surgery 07/04/13 by Dr. Magnus Ivan. Using tramadol. Having some left  neck discomfort.  Skin: Negative.   Neurological: Positive for numbness. Negative for dizziness, tremors, seizures, syncope, facial asymmetry, speech difficulty, weakness, light-headedness and headaches.       Episodes of numbness in the right hand.  Hematological: Negative.   Psychiatric/Behavioral: Negative.        Some difficulty falling asleep. Using less Ambien now.       Objective:BP 126/78  Pulse 58  Wt 184 lb (83.462 kg)  BMI 31.57 kg/m2  SpO2 96%    Physical Exam  Constitutional: She is oriented to person, place, and time. No distress.  Obese.  HENT:  Head: Normocephalic and atraumatic.  Right Ear: External ear normal.  Left Ear: External ear normal.  Eyes: Conjunctivae and EOM are normal. Pupils  are equal, round, and reactive to light. Left eye exhibits no discharge. No scleral icterus.  Neck: Normal range of motion. Neck supple. No JVD present. No tracheal deviation present. No thyromegaly present.  Cardiovascular: Normal rate, regular rhythm and intact distal pulses.  Exam reveals no gallop and no friction rub.   No murmur heard. Pulmonary/Chest: Breath sounds normal. No respiratory distress. She has no wheezes. She has no rales. She exhibits no tenderness.  Abdominal: Soft. Bowel sounds are normal. She exhibits no distension and no mass. There is no tenderness.  Musculoskeletal: She exhibits edema and tenderness.  Right knee is recovering from TKR. Using a cane. Mid tenderness left neck musculature.  Lymphadenopathy:    She has no cervical adenopathy.  Neurological: She is alert and oriented to person, place, and time. No cranial nerve deficit. Coordination normal.  Skin: No rash noted. No erythema. No pallor.  Psychiatric: She has a normal mood and affect. Her behavior is normal. Judgment and thought content normal.      Admission on 07/04/2013, Discharged on 07/08/2013  Component Date Value Range Status  . WBC 07/05/2013 7.4  4.0 - 10.5 K/uL Final  . RBC 07/05/2013 2.86* 3.87 - 5.11 MIL/uL Final  . Hemoglobin 07/05/2013 8.6* 12.0 - 15.0 g/dL Final  . HCT 47/82/9562 26.3* 36.0 - 46.0 % Final  . MCV 07/05/2013 92.0  78.0 - 100.0 fL Final  . MCH 07/05/2013 30.1  26.0 - 34.0 pg Final  . MCHC 07/05/2013 32.7  30.0 - 36.0 g/dL Final  . RDW 13/07/6577 13.2  11.5 - 15.5 % Final  . Platelets 07/05/2013 135* 150 - 400 K/uL Final  . Sodium 07/05/2013 136  135 - 145 mEq/L Final  . Potassium 07/05/2013 3.1* 3.5 - 5.1 mEq/L Final  . Chloride 07/05/2013 102  96 - 112 mEq/L Final  . CO2 07/05/2013 29  19 - 32 mEq/L Final  . Glucose, Bld 07/05/2013 144* 70 - 99 mg/dL Final  . BUN 46/96/2952 10  6 - 23 mg/dL Final  . Creatinine, Ser 07/05/2013 0.65  0.50 - 1.10 mg/dL Final  . Calcium  84/13/2440 8.3* 8.4 - 10.5 mg/dL Final  . GFR calc non Af Amer 07/05/2013 82* >90 mL/min Final  . GFR calc Af Amer 07/05/2013 >90  >90 mL/min Final   Comment:                                 The eGFR has been calculated                          using the CKD EPI equation.  This calculation has not been                          validated in all clinical                          situations.                          eGFR's persistently                          <90 mL/min signify                          possible Chronic Kidney Disease.  . WBC 07/06/2013 7.1  4.0 - 10.5 K/uL Final  . RBC 07/06/2013 2.51* 3.87 - 5.11 MIL/uL Final  . Hemoglobin 07/06/2013 7.8* 12.0 - 15.0 g/dL Final  . HCT 16/09/9603 22.6* 36.0 - 46.0 % Final  . MCV 07/06/2013 90.0  78.0 - 100.0 fL Final  . MCH 07/06/2013 31.1  26.0 - 34.0 pg Final  . MCHC 07/06/2013 34.5  30.0 - 36.0 g/dL Final  . RDW 54/08/8118 13.2  11.5 - 15.5 % Final  . Platelets 07/06/2013 115* 150 - 400 K/uL Final   Comment: REPEATED TO VERIFY                          SPECIMEN CHECKED FOR CLOTS                          PLATELET COUNT CONFIRMED BY SMEAR  . WBC 07/07/2013 6.7  4.0 - 10.5 K/uL Final  . RBC 07/07/2013 2.40* 3.87 - 5.11 MIL/uL Final  . Hemoglobin 07/07/2013 7.2* 12.0 - 15.0 g/dL Final  . HCT 14/78/2956 21.7* 36.0 - 46.0 % Final  . MCV 07/07/2013 90.4  78.0 - 100.0 fL Final  . MCH 07/07/2013 30.0  26.0 - 34.0 pg Final  . MCHC 07/07/2013 33.2  30.0 - 36.0 g/dL Final  . RDW 21/30/8657 12.9  11.5 - 15.5 % Final  . Platelets 07/07/2013 118* 150 - 400 K/uL Final   CONSISTENT WITH PREVIOUS RESULT  . Order Confirmation 07/07/2013 ORDER PROCESSED BY BLOOD BANK   Final  . WBC 07/08/2013 6.3  4.0 - 10.5 K/uL Final  . RBC 07/08/2013 3.03* 3.87 - 5.11 MIL/uL Final  . Hemoglobin 07/08/2013 9.4* 12.0 - 15.0 g/dL Final   Comment: RESULT REPEATED AND VERIFIED                          DELTA CHECK NOTED  . HCT 07/08/2013  27.0* 36.0 - 46.0 % Final  . MCV 07/08/2013 89.1  78.0 - 100.0 fL Final  . MCH 07/08/2013 31.0  26.0 - 34.0 pg Final  . MCHC 07/08/2013 34.8  30.0 - 36.0 g/dL Final  . RDW 84/69/6295 13.1  11.5 - 15.5 % Final  . Platelets 07/08/2013 156  150 - 400 K/uL Final   Comment: RESULT REPEATED AND VERIFIED                          DELTA CHECK NOTED       Assessment & Plan:  Acute blood loss anemia -  Plan: CBC with Differential  Knee joint replacement by other means: Continue usual postoperative care in therapy  Pruritus - Plan: betamethasone valerate ointment (VALISONE) 0.1 %  Insomnia, unspecified - Plan: zolpidem (AMBIEN) 10 MG tablet  Anxiety state, unspecified - Plan: ALPRAZolam (XANAX) 0.25 MG tablet  HYPERCHOLESTEROLEMIA - Plan: Lipid panel  HYPERTENSION - Plan: Basic metabolic panel  Edema: Unchanged  LEG PAIN, RIGHT: Improving postoperative right TKR  Muscle spasm: Improving

## 2013-10-01 NOTE — Patient Instructions (Signed)
Take medication as listed. 

## 2013-10-02 LAB — CBC WITH DIFFERENTIAL/PLATELET
Basophils Absolute: 0 10*3/uL (ref 0.0–0.2)
Eosinophils Absolute: 0.2 10*3/uL (ref 0.0–0.4)
HCT: 38.1 % (ref 34.0–46.6)
Immature Grans (Abs): 0 10*3/uL (ref 0.0–0.1)
Immature Granulocytes: 0 %
Lymphocytes Absolute: 2.8 10*3/uL (ref 0.7–3.1)
MCH: 29.8 pg (ref 26.6–33.0)
MCHC: 32.8 g/dL (ref 31.5–35.7)
MCV: 91 fL (ref 79–97)
Monocytes Absolute: 0.6 10*3/uL (ref 0.1–0.9)
Neutrophils Relative %: 50 %
RDW: 13.4 % (ref 12.3–15.4)
WBC: 7.2 10*3/uL (ref 3.4–10.8)

## 2013-10-02 LAB — BASIC METABOLIC PANEL
BUN/Creatinine Ratio: 26 (ref 11–26)
CO2: 29 mmol/L (ref 18–29)
Calcium: 9.5 mg/dL (ref 8.6–10.2)
Creatinine, Ser: 0.73 mg/dL (ref 0.57–1.00)
Potassium: 4 mmol/L (ref 3.5–5.2)
Sodium: 140 mmol/L (ref 134–144)

## 2013-10-02 LAB — LIPID PANEL
Chol/HDL Ratio: 3.8 ratio units (ref 0.0–4.4)
HDL: 40 mg/dL (ref >39)

## 2013-10-22 ENCOUNTER — Encounter (HOSPITAL_COMMUNITY): Payer: Self-pay | Admitting: *Deleted

## 2013-10-22 ENCOUNTER — Other Ambulatory Visit (HOSPITAL_COMMUNITY): Payer: Self-pay | Admitting: Orthopaedic Surgery

## 2013-10-22 ENCOUNTER — Encounter (HOSPITAL_COMMUNITY): Payer: Self-pay | Admitting: Pharmacist

## 2013-10-23 ENCOUNTER — Encounter (HOSPITAL_COMMUNITY)
Admission: RE | Disposition: A | Discharge status: Home / Self Care | Payer: Self-pay | Source: Ambulatory Visit | Attending: Orthopaedic Surgery

## 2013-10-23 ENCOUNTER — Ambulatory Visit (HOSPITAL_COMMUNITY): Payer: Medicare Other | Admitting: Certified Registered Nurse Anesthetist

## 2013-10-23 ENCOUNTER — Encounter (HOSPITAL_COMMUNITY): Payer: Self-pay | Admitting: *Deleted

## 2013-10-23 ENCOUNTER — Ambulatory Visit (HOSPITAL_COMMUNITY)
Admission: RE | Admit: 2013-10-23 | Discharge: 2013-10-23 | Disposition: A | Discharge status: Home / Self Care | Payer: Medicare Other | Source: Ambulatory Visit | Attending: Orthopaedic Surgery | Admitting: Orthopaedic Surgery

## 2013-10-23 ENCOUNTER — Encounter (HOSPITAL_COMMUNITY): Payer: Medicare Other | Admitting: Certified Registered Nurse Anesthetist

## 2013-10-23 DIAGNOSIS — K219 Gastro-esophageal reflux disease without esophagitis: Secondary | ICD-10-CM | POA: Insufficient documentation

## 2013-10-23 DIAGNOSIS — M24669 Ankylosis, unspecified knee: Secondary | ICD-10-CM | POA: Insufficient documentation

## 2013-10-23 DIAGNOSIS — M2469 Ankylosis, other specified joint: Secondary | ICD-10-CM | POA: Insufficient documentation

## 2013-10-23 DIAGNOSIS — F3289 Other specified depressive episodes: Secondary | ICD-10-CM | POA: Insufficient documentation

## 2013-10-23 DIAGNOSIS — F411 Generalized anxiety disorder: Secondary | ICD-10-CM | POA: Insufficient documentation

## 2013-10-23 DIAGNOSIS — M24661 Ankylosis, right knee: Secondary | ICD-10-CM

## 2013-10-23 DIAGNOSIS — I10 Essential (primary) hypertension: Secondary | ICD-10-CM | POA: Insufficient documentation

## 2013-10-23 DIAGNOSIS — F329 Major depressive disorder, single episode, unspecified: Secondary | ICD-10-CM | POA: Insufficient documentation

## 2013-10-23 DIAGNOSIS — Z96659 Presence of unspecified artificial knee joint: Secondary | ICD-10-CM | POA: Insufficient documentation

## 2013-10-23 DIAGNOSIS — E039 Hypothyroidism, unspecified: Secondary | ICD-10-CM | POA: Insufficient documentation

## 2013-10-23 HISTORY — PX: KNEE CLOSED REDUCTION: SHX995

## 2013-10-23 LAB — CBC
HCT: 40 % (ref 36.0–46.0)
MCV: 89.7 fL (ref 78.0–100.0)
Platelets: 162 10*3/uL (ref 150–400)
RBC: 4.46 MIL/uL (ref 3.87–5.11)
RDW: 13.2 % (ref 11.5–15.5)
WBC: 8.5 10*3/uL (ref 4.0–10.5)

## 2013-10-23 LAB — BASIC METABOLIC PANEL
BUN: 19 mg/dL (ref 6–23)
CO2: 28 mEq/L (ref 19–32)
Chloride: 102 mEq/L (ref 96–112)
GFR calc Af Amer: >90 mL/min (ref >90)
Potassium: 3.6 mEq/L (ref 3.5–5.1)

## 2013-10-23 SURGERY — MANIPULATION, KNEE, CLOSED
Anesthesia: General | Site: Knee | Laterality: Right | Wound class: Clean

## 2013-10-23 MED ORDER — METHYLPREDNISOLONE ACETATE 40 MG/ML IJ SUSP
INTRAMUSCULAR | Status: DC | PRN
  Administered 2013-10-23: 10:00:00 via INTRAMUSCULAR

## 2013-10-23 MED ORDER — HYDROMORPHONE HCL PF 1 MG/ML IJ SOLN
0.2500 mg | INTRAMUSCULAR | Status: DC | PRN
  Administered 2013-10-23: 0.25 mg via INTRAVENOUS

## 2013-10-23 MED ORDER — PROPOFOL 10 MG/ML IV BOLUS
INTRAVENOUS | Status: DC | PRN
  Administered 2013-10-23: 200 mg via INTRAVENOUS

## 2013-10-23 MED ORDER — METHYLPREDNISOLONE ACETATE 40 MG/ML IJ SUSP
INTRAMUSCULAR | Status: AC
  Filled 2013-10-23: qty 1

## 2013-10-23 MED ORDER — ONDANSETRON HCL 4 MG/2ML IJ SOLN
INTRAMUSCULAR | Status: DC | PRN
  Administered 2013-10-23: 4 mg via INTRAVENOUS

## 2013-10-23 MED ORDER — OXYCODONE HCL 5 MG/5ML PO SOLN: 5.0000 mg | Freq: Once | ORAL | Status: DC | PRN

## 2013-10-23 MED ORDER — OXYCODONE HCL 5 MG PO TABS: 5.0000 mg | ORAL_TABLET | Freq: Once | ORAL | Status: DC | PRN

## 2013-10-23 MED ORDER — MEPERIDINE HCL 25 MG/ML IJ SOLN: 6.2500 mg | INTRAMUSCULAR | Status: DC | PRN

## 2013-10-23 MED ORDER — MIDAZOLAM HCL 5 MG/5ML IJ SOLN
INTRAMUSCULAR | Status: DC | PRN
  Administered 2013-10-23: 2 mg via INTRAVENOUS

## 2013-10-23 MED ORDER — MORPHINE SULFATE 4 MG/ML IJ SOLN
INTRAMUSCULAR | Status: DC | PRN
  Administered 2013-10-23: 4 mg via INTRAVENOUS

## 2013-10-23 MED ORDER — MORPHINE SULFATE 2 MG/ML IJ SOLN
INTRAMUSCULAR | Status: AC
  Filled 2013-10-23: qty 1

## 2013-10-23 MED ORDER — MIDAZOLAM HCL 2 MG/2ML IJ SOLN
INTRAMUSCULAR | Status: AC
  Filled 2013-10-23: qty 2

## 2013-10-23 MED ORDER — LIDOCAINE HCL (CARDIAC) 20 MG/ML IV SOLN
INTRAVENOUS | Status: DC | PRN
  Administered 2013-10-23: 100 mg via INTRAVENOUS

## 2013-10-23 MED ORDER — ONDANSETRON HCL 4 MG/2ML IJ SOLN: 4.0000 mg | Freq: Once | INTRAMUSCULAR | Status: DC | PRN

## 2013-10-23 MED ORDER — HYDROMORPHONE HCL PF 1 MG/ML IJ SOLN
INTRAMUSCULAR | Status: AC
  Filled 2013-10-23: qty 1

## 2013-10-23 MED ORDER — LACTATED RINGERS IV SOLN
INTRAVENOUS | Status: DC
  Administered 2013-10-23: 10 mL/h via INTRAVENOUS

## 2013-10-23 MED ORDER — OXYCODONE-ACETAMINOPHEN 5-325 MG PO TABS: 1.0000 | ORAL_TABLET | ORAL | Status: DC | PRN

## 2013-10-23 MED ORDER — BUPIVACAINE HCL (PF) 0.25 % IJ SOLN
INTRAMUSCULAR | Status: AC
  Filled 2013-10-23: qty 30

## 2013-10-23 MED ORDER — METHYLPREDNISOLONE ACETATE 80 MG/ML IJ SUSP
INTRAMUSCULAR | Status: AC
  Filled 2013-10-23: qty 1

## 2013-10-23 SURGICAL SUPPLY — 9 items
CLOTH BEACON ORANGE TIMEOUT ST (SAFETY) ×2 IMPLANT
GLOVE BIO SURGEON STRL SZ8 (GLOVE) ×2 IMPLANT
GLOVE BIOGEL M 8.0 STRL (GLOVE) ×2 IMPLANT
GLOVE BIOGEL PI IND STRL 8 (GLOVE) ×1 IMPLANT
GLOVE BIOGEL PI INDICATOR 8 (GLOVE) ×1
GOWN PREVENTION PLUS LG XLONG (DISPOSABLE) IMPLANT
GOWN PREVENTION PLUS XLARGE (GOWN DISPOSABLE) ×2 IMPLANT
KIT ROOM TURNOVER OR (KITS) ×2 IMPLANT
PAD ARMBOARD 7.5X6 YLW CONV (MISCELLANEOUS) ×4 IMPLANT

## 2013-10-23 NOTE — Anesthesia Procedure Notes (Signed)
Procedure Name: LMA Insertion Date/Time: 10/23/2013 9:27 AM Performed by: Jerilee Hoh Pre-anesthesia Checklist: Patient identified, Emergency Drugs available, Suction available and Patient being monitored Patient Re-evaluated:Patient Re-evaluated prior to inductionOxygen Delivery Method: Circle system utilized Preoxygenation: Pre-oxygenation with 100% oxygen Intubation Type: IV induction LMA: LMA inserted LMA Size: 4.0 Tube type: Oral Number of attempts: 1 Placement Confirmation: positive ETCO2 and breath sounds checked- equal and bilateral Tube secured with: Tape Dental Injury: Teeth and Oropharynx as per pre-operative assessment

## 2013-10-23 NOTE — Transfer of Care (Signed)
Immediate Anesthesia Transfer of Care Note  Patient: [name]  Procedure(s) Performed: Procedure(s): CLOSED MANIPULATION RIGHT KNEE (Right)  Patient Location: PACU  Anesthesia Type:General  Level of Consciousness: awake, alert , oriented and patient cooperative  Airway & Oxygen Therapy: Patient Spontanous Breathing and Patient connected to nasal cannula oxygen  Post-op Assessment: Report given to PACU RN, Post -op Vital signs reviewed and stable and Patient moving all extremities  Post vital signs: Reviewed and stable  Complications: No apparent anesthesia complications

## 2013-10-23 NOTE — Anesthesia Postprocedure Evaluation (Signed)
Anesthesia Post Note  Patient: [name]  Procedure(s) Performed: Procedure(s) (LRB): CLOSED MANIPULATION RIGHT KNEE (Right)  Anesthesia type: general  Patient location: PACU  Post pain: Pain level controlled  Post assessment: Patient's Cardiovascular Status Stable  Last Vitals:  Filed Vitals:   10/23/13 1044  BP: 146/79  Pulse: 53  Temp: 36.1 C  Resp: 15    Post vital signs: Reviewed and stable  Level of consciousness: sedated  Complications: No apparent anesthesia complications

## 2013-10-23 NOTE — Anesthesia Preprocedure Evaluation (Addendum)
Anesthesia Evaluation  Patient identified by MRN, date of birth, ID band Patient awake    Reviewed: Allergy & Precautions, H&P , NPO status , Patient's Chart, lab work & pertinent test results, reviewed documented beta blocker date and time   Airway Mallampati: II TM Distance: >3 FB     Dental  (+) Teeth Intact and Dental Advisory Given   Pulmonary          Cardiovascular hypertension, Pt. on medications     Neuro/Psych  Headaches, Anxiety Depression    GI/Hepatic PUD, GERD-  ,  Endo/Other  Hypothyroidism   Renal/GU      Musculoskeletal   Abdominal   Peds  Hematology   Anesthesia Other Findings   Reproductive/Obstetrics                           Anesthesia Physical Anesthesia Plan  ASA: III  Anesthesia Plan: General   Post-op Pain Management:    Induction: Intravenous  Airway Management Planned: LMA  Additional Equipment:   Intra-op Plan:   Post-operative Plan: Extubation in OR  Informed Consent: I have reviewed the patients History and Physical, chart, labs and discussed the procedure including the risks, benefits and alternatives for the proposed anesthesia with the patient or authorized representative who has indicated his/her understanding and acceptance.   Dental advisory given  Plan Discussed with: Surgeon and CRNA  Anesthesia Plan Comments:        Anesthesia Quick Evaluation

## 2013-10-23 NOTE — H&P (Signed)
[name] is an 79 y.o. female.   Chief Complaint:   Right knee stiffness and decrease motion post total knee replacement HPI:   79 yo female several weeks post a right total knee replacement.  Her right knee flexion has only gotten to 95 degrees passively.  She wishes to get more motion out of her knee and with the failure of physical therapy, we plan to proceed to the OR for a right knee manipulation under anesthesia.  She then has therapy this afternoon.  Risks and benefits discussed in detail.  Past Medical History  Diagnosis Date  . Anxiety state, unspecified   . Backache, unspecified   . Diverticulosis of colon (without mention of hemorrhage)   . Stricture and stenosis of esophagus   . Esophageal reflux   . Enthesopathy of hip region   . Diaphragmatic hernia without mention of obstruction or gangrene   . Pain in joint, pelvic region and thigh   . Pure hypercholesterolemia   . Unspecified essential hypertension   . Elevated blood pressure reading without diagnosis of hypertension   . Pain in limb   . Other dysphagia   . Cramp of limb   . Chest pain, unspecified   . Other nonspecific abnormal serum enzyme levels   . Hepatitis, unspecified   . Disturbance of skin sensation   . Unspecified vitamin D deficiency   . Pain in joint, hand   . Other abnormal blood chemistry   . Pain in joint, ankle and foot   . Plantar fascial fibromatosis   . Unspecified hypothyroidism   . Asymptomatic varicose veins   . Allergic rhinitis due to pollen   . Myalgia and myositis, unspecified   . Pain in joint, lower leg   . Sciatica   . Reflux esophagitis   . Anxiety state, unspecified   . Depressive disorder, not elsewhere classified   . Other specified cardiac dysrhythmias(427.89)   . Other malaise and fatigue   . Insomnia, unspecified   . Carpal tunnel syndrome   . Spinal stenosis, unspecified region other than cervical   . Lumbago   . Symptomatic menopausal or female climacteric states    . Unspecified essential hypertension   . Headache(784.0)   . Other and unspecified hyperlipidemia   . Obesity, unspecified   . Diaphragmatic hernia without mention of obstruction or gangrene   . Peptic ulcer, unspecified site, unspecified as acute or chronic, without mention of hemorrhage, perforation, or obstruction     Past Surgical History  Procedure Laterality Date  . Cholecystectomy  1989    DR BOWMAN  . Abdominal hysterectomy  1979  . Knee surgery  2003  . Breast biopsy      twice  . Excision of actinic keratosis      DR LUPTON   . O.s. cataract removed      DR EPES   . O.d. cataract removed      DR EPES  . Laser for cloudy cap left eye      DR DIGBY  . Right tkr      DR RENDALL  . Left tkr      DR RENDALL  . Knee arthroscopy Right 06-26-13  . Total knee arthroplasty Right 07/04/2013    Procedure: RIGHT TOTAL KNEE ARTHROPLASTY;  Surgeon: Kathryne Hitch, MD;  Location: [facility];  Service: Orthopedics;  Laterality: Right;    Family History  Problem Relation Age of Onset  . Ovarian cancer Mother   . Uterine  cancer Mother   . Heart disease Father    Social History:  reports that she has never smoked. She has never used smokeless tobacco. She reports that she does not drink alcohol or use illicit drugs.  Allergies:  Allergies  Allergen Reactions  . Advil [Ibuprofen]     GI upset  . Aleve [Naproxen Sodium]     GI upset  . Aspirin Nausea And Vomiting    Takes baby aspirin qod without problems  . Celebrex [Celecoxib]     GI upset  . Diclofenac     GI upset  . Metoclopramide Hcl     REACTION: heart palps  . Nsaids Other (See Comments)    Tears my stomach up   . Other     Antihistamine - increase BP  . Timolol     Dropped HR    Medications Prior to Admission  Medication Sig Dispense Refill  . ALPRAZolam (XANAX) 0.25 MG tablet Take 0.125 mg by mouth at bedtime as needed for anxiety.      Marland Kitchen atorvastatin (LIPITOR) 10 MG tablet Take 10 mg by mouth  every morning.       Marland Kitchen azelastine (OPTIVAR) 0.05 % ophthalmic solution Place 1 drop into both eyes 2 (two) times daily.       . betamethasone valerate ointment (VALISONE) 0.1 % Apply 1 application topically 2 (two) times daily as needed (itching). A      . bimatoprost (LUMIGAN) 0.01 % SOLN Instill 1 drop into both eyes once daily in the evening      . Cholecalciferol (VITAMIN D) 2000 UNITS tablet Take 2,000 Units by mouth daily.      . diclofenac sodium (VOLTAREN) 1 % GEL Apply 2 g topically daily as needed (for knee pain).       . dorzolamide (TRUSOPT) 2 % ophthalmic solution Instill 1 drop in each eye twice a day      . glucosamine-chondroitin 500-400 MG tablet Take 2 tablets by mouth daily.       . methylcellulose (ARTIFICIAL TEARS) 1 % ophthalmic solution Place 1 drop into both eyes daily as needed (for dry eyes).       Marland Kitchen omeprazole (PRILOSEC) 40 MG capsule Take 40 mg by mouth daily as needed (for acid reflux).       . traMADol (ULTRAM) 50 MG tablet Take 50-100 mg by mouth daily as needed for pain.       . valsartan-hydrochlorothiazide (DIOVAN-HCT) 320-25 MG per tablet Take 1 tablet by mouth daily.      . vitamin E 400 UNIT capsule Take 400 Units by mouth daily.      Marland Kitchen zolpidem (AMBIEN) 10 MG tablet Take 0.5 tablets (5 mg total) by mouth at bedtime as needed for sleep.  30 tablet  1    Results for orders placed during the hospital encounter of 10/23/13 (from the past 48 hour(s))  CBC     Status: None   Collection Time    10/23/13  7:18 AM      Result Value Range   WBC 8.5  4.0 - 10.5 K/uL   RBC 4.46  3.87 - 5.11 MIL/uL   Hemoglobin 13.5  12.0 - 15.0 g/dL   HCT 69.6  29.5 - 28.4 %   MCV 89.7  78.0 - 100.0 fL   MCH 30.3  26.0 - 34.0 pg   MCHC 33.8  30.0 - 36.0 g/dL   RDW 13.2  44.0 - 10.2 %   Platelets 162  150 - 400 K/uL  BASIC METABOLIC PANEL     Status: Abnormal   Collection Time    10/23/13  7:18 AM      Result Value Range   Sodium 138  135 - 145 mEq/L   Potassium 3.6  3.5 -  5.1 mEq/L   Chloride 102  96 - 112 mEq/L   CO2 28  19 - 32 mEq/L   Glucose, Bld 99  70 - 99 mg/dL   BUN 19  6 - 23 mg/dL   Creatinine, Ser 9.81  0.50 - 1.10 mg/dL   Calcium 9.4  8.4 - 19.1 mg/dL   GFR calc non Af Amer 79 (*) >90 mL/min   GFR calc Af Amer >90  >90 mL/min   Comment: (NOTE)     The eGFR has been calculated using the CKD EPI equation.     This calculation has not been validated in all clinical situations.     eGFR's persistently <90 mL/min signify possible Chronic Kidney     Disease.   No results found.  Review of Systems  All other systems reviewed and are negative.    Blood pressure 176/78, pulse 47, temperature 98.3 F (36.8 C), temperature source Oral, resp. rate 16, height 5\' 3"  (1.6 m), weight 79.379 kg (175 lb), SpO2 99.00%. Physical Exam  Constitutional: She is oriented to person, place, and time. She appears well-developed and well-nourished.  HENT:  Head: Normocephalic and atraumatic.  Eyes: EOM are normal. Pupils are equal, round, and reactive to light.  Neck: Normal range of motion. Neck supple.  Cardiovascular: Normal rate and regular rhythm.   Respiratory: Effort normal and breath sounds normal.  GI: Soft. Bowel sounds are normal.  Musculoskeletal:       Right knee: She exhibits decreased range of motion.  Neurological: She is alert and oriented to person, place, and time.  Skin: Skin is warm and dry.  Psychiatric: She has a normal mood and affect.     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  Seymour Pavlak Y 10/23/2013, 9:10 AM

## 2013-10-23 NOTE — Brief Op Note (Signed)
10/23/2013  9:37 AM  PATIENT:  [name]  79 y.o. female  PRE-OPERATIVE DIAGNOSIS:  Right knee arthrofibrosis  POST-OPERATIVE DIAGNOSIS:  Right knee arthrofibrosis  PROCEDURE:  Procedure(s): CLOSED MANIPULATION RIGHT KNEE (Right)  SURGEON:  Surgeon(s) and Role:    * Kathryne Hitch, MD - Primary  PHYSICIAN ASSISTANT:   ASSISTANTS: none   ANESTHESIA:   local and general  EBL:  Total I/O In: 700 [I.V.:700] Out: -   BLOOD ADMINISTERED:none  DRAINS: none   LOCAL MEDICATIONS USED:  MARCAINE and morphine  SPECIMEN:  No Specimen  DISPOSITION OF SPECIMEN:  N/A  COUNTS:  YES  TOURNIQUET:  * No tourniquets in log *  DICTATION: .Other Dictation: Dictation Number [id number]  PLAN OF CARE: Discharge to home after PACU  PATIENT DISPOSITION:  PACU - hemodynamically stable.   Delay start of Pharmacological VTE agent (>24hrs) due to surgical blood loss or risk of bleeding: not applicable

## 2013-10-23 NOTE — Preoperative (Signed)
Beta Blockers   Reason not to administer Beta Blockers:Not Applicable 

## 2013-10-24 NOTE — Op Note (Signed)
NAMECHANTEL, [name] NO.:  [id number]  MEDICAL RECORD NO.:  [id number]  LOCATION:  [facility]                         FACILITY:  [facility]  PHYSICIAN:  Vanita Panda. Magnus Ivan, M.D.DATE OF BIRTH:  [birth year]  DATE OF PROCEDURE:  10/23/2013 DATE OF DISCHARGE:  10/23/2013                              OPERATIVE REPORT   PREOPERATIVE DIAGNOSIS:  Arthrofibrosis right knee status post right total knee arthroplasty.  POSTOPERATIVE DIAGNOSIS:  Arthrofibrosis right knee status post right total knee arthroplasty.  PROCEDURE:  Right knee manipulation under anesthesia.  FINDINGS:  Preoperative range of motion -3 degrees of full extension to 85 degrees of flexion.  Postoperative range of motion, almost 0 degrees of extension to 100 degrees of flexion.  SURGEON:  Doneen Poisson, MD.  ANESTHESIA: 1. General. 2. Local with 20 mg of morphine mixed with 3 mL of Marcaine and 1 mL     of Depo-Medrol.  BLOOD LOSS:  Not applicable.  ANTIBIOTICS:  None.  COMPLICATIONS:  None.  INDICATIONS:  [name] is a very pleasant 79 year old female who almost 2 months ago underwent a right total knee arthroplasty.  This was successful surgery, however, postoperative her pain is limited her range of motion.  She says that she can get passively to 95 degrees but in the office I have never been able to get her past 85.  This is definitely affected her walking at this point, even with conservative treatment including daily therapy.  She wished to proceed with the manipulation under anesthesia to improve range of motion.  She understands the risks of mainly of the fracture and decreased motion.  PROCEDURE DESCRIPTION:  After informed consent was obtained, appropriate right leg was marked.  She was brought to the operating room, placed supine on the operating table.  General anesthesia was then obtained.  A time-out was called and she was identified as correct patient, right knee.  I then  took the pictures of her knee.  Preoperatively showed flexion to only about 85 degrees.  With gentle manipulation, could feel scar tissue release.  I was able to easily get her knee then to 120 degrees.  I put her through many cycles of motion even with extension as well.  I then prepped the lateral aspect of her knee with Betadine alcohol prior to the injection of morphine, Marcaine, and Depo-Medrol into the knee joint itself.  Band-aid was applied.  She was awakened, extubated, and taken to recovery room in stable condition.  All final counts were correct.  There were no complications noted.  Postoperatively, she is going gone home from the recovery room and has therapy starting this afternoon.     Vanita Panda. Magnus Ivan, M.D.     CYB/MEDQ  D:  10/23/2013  T:  10/24/2013  Job:  [id number]

## 2013-10-28 ENCOUNTER — Encounter (HOSPITAL_COMMUNITY): Payer: Self-pay | Admitting: Orthopaedic Surgery

## 2013-11-18 ENCOUNTER — Other Ambulatory Visit: Payer: Self-pay | Admitting: Internal Medicine

## 2013-12-03 ENCOUNTER — Other Ambulatory Visit: Payer: Self-pay | Admitting: Internal Medicine

## 2014-01-06 ENCOUNTER — Other Ambulatory Visit: Payer: Self-pay | Admitting: Internal Medicine

## 2014-01-27 ENCOUNTER — Ambulatory Visit (INDEPENDENT_AMBULATORY_CARE_PROVIDER_SITE_OTHER): Payer: Medicare Other | Admitting: Internal Medicine

## 2014-01-27 VITALS — BP 128/76 | HR 63 | Resp 12 | Wt 183.0 lb

## 2014-01-27 DIAGNOSIS — R1319 Other dysphagia: Secondary | ICD-10-CM

## 2014-01-27 DIAGNOSIS — F411 Generalized anxiety disorder: Secondary | ICD-10-CM

## 2014-01-27 DIAGNOSIS — G4700 Insomnia, unspecified: Secondary | ICD-10-CM

## 2014-01-27 DIAGNOSIS — E785 Hyperlipidemia, unspecified: Secondary | ICD-10-CM

## 2014-01-27 DIAGNOSIS — M1711 Unilateral primary osteoarthritis, right knee: Secondary | ICD-10-CM

## 2014-01-27 DIAGNOSIS — M1710 Unilateral primary osteoarthritis, unspecified knee: Secondary | ICD-10-CM

## 2014-01-27 DIAGNOSIS — K219 Gastro-esophageal reflux disease without esophagitis: Secondary | ICD-10-CM

## 2014-01-27 DIAGNOSIS — I10 Essential (primary) hypertension: Secondary | ICD-10-CM

## 2014-01-27 DIAGNOSIS — IMO0002 Reserved for concepts with insufficient information to code with codable children: Secondary | ICD-10-CM

## 2014-01-27 MED ORDER — ZOLPIDEM TARTRATE 10 MG PO TABS: 5.0000 mg | ORAL_TABLET | Freq: Every evening | ORAL | Status: DC | PRN

## 2014-01-27 MED ORDER — ALPRAZOLAM 0.25 MG PO TABS: 0.1250 mg | ORAL_TABLET | Freq: Every evening | ORAL | Status: DC | PRN

## 2014-01-27 MED ORDER — TRAMADOL HCL 50 MG PO TABS: 50.0000 mg | ORAL_TABLET | Freq: Every day | ORAL | Status: DC | PRN

## 2014-01-27 NOTE — Progress Notes (Signed)
Patient ID: [name], female   DOB: 08-17-1931, 80 y.o.   MRN: [id number]    Location:    [facility]  Place of Service:  OFFICE    Allergies  Allergen Reactions  . Advil [Ibuprofen]     GI upset  . Aleve [Naproxen Sodium]     GI upset  . Aspirin Nausea And Vomiting    Takes baby aspirin qod without problems  . Celebrex [Celecoxib]     GI upset  . Diclofenac     GI upset  . Metoclopramide Hcl     REACTION: heart palps  . Nsaids Other (See Comments)    Tears my stomach up   . Other     Antihistamine - increase BP  . Timolol     Dropped HR    Chief Complaint  Patient presents with  . Medical Managment of Chronic Issues    3 month follow-up   . Medication Refill    Renew Xanax and Ambien   . Knee Problem    Right knee follow-up, discuss ideal healing time-frame   . Head pain    Patient c/o head pain ONLY when yawning     HPI:  Insomnia, unspecified - benefits from zolpidem (AMBIEN) 10 MG tablet  HYPERTENSION - controlled  GERD: asymptomatic  Arthritis of knee, right :continues with pain despite her TKR and subsequent manipulation in Oct 23, 2013. Continues to use traMADol (ULTRAM) 50 MG tablet  Other dysphagia: improved  ANXIETY: improved. Benefits from ALPRAZolam (XANAX) 0.25 MG tablet  Other and unspecified hyperlipidemia : needs lab    Medications: Patient's Medications  New Prescriptions   No medications on file  Previous Medications   ALPRAZOLAM (XANAX) 0.25 MG TABLET    Take 0.125 mg by mouth at bedtime as needed for anxiety.   ATORVASTATIN (LIPITOR) 10 MG TABLET    TAKE 1 TABLET BY MOUTH EVERY DAY TO CONTROL CHOLESTEROL   AZELASTINE (OPTIVAR) 0.05 % OPHTHALMIC SOLUTION    Place 1 drop into both eyes 2 (two) times daily.    BETAMETHASONE VALERATE OINTMENT (VALISONE) 0.1 %    Apply 1 application topically 2 (two) times daily as needed (itching). A   BIMATOPROST (LUMIGAN) 0.01 % SOLN    Instill 1 drop into both eyes once daily in the evening   CHOLECALCIFEROL (VITAMIN D) 2000 UNITS TABLET    Take 2,000 Units by mouth daily.   DICLOFENAC SODIUM (VOLTAREN) 1 % GEL    Apply 2 g topically daily as needed (for knee pain).    DORZOLAMIDE (TRUSOPT) 2 % OPHTHALMIC SOLUTION    Place 1 drop into both eyes 3 (three) times daily.    GLUCOSAMINE-CHONDROITIN 500-400 MG TABLET    Take 2 tablets by mouth daily.    METHYLCELLULOSE (ARTIFICIAL TEARS) 1 % OPHTHALMIC SOLUTION    Place 1 drop into both eyes daily as needed (for dry eyes).    OMEPRAZOLE (PRILOSEC) 40 MG CAPSULE    Take 40 mg by mouth daily as needed (for acid reflux).    TRAMADOL (ULTRAM) 50 MG TABLET    Take 50-100 mg by mouth daily as needed for pain.    VALSARTAN-HYDROCHLOROTHIAZIDE (DIOVAN-HCT) 320-25 MG PER TABLET    TAKE 1 TABLET EVERY DAY TO CONTROL BLOOD PRESSURE   VITAMIN E 400 UNIT CAPSULE    Take 400 Units by mouth daily.   ZOLPIDEM (AMBIEN) 10 MG TABLET    Take 0.5 tablets (5 mg total) by mouth at bedtime as needed  for sleep.  Modified Medications   No medications on file  Discontinued Medications   OXYCODONE-ACETAMINOPHEN (ROXICET) 5-325 MG PER TABLET    Take 1-2 tablets by mouth every 4 (four) hours as needed for pain.   VALSARTAN-HYDROCHLOROTHIAZIDE (DIOVAN-HCT) 320-25 MG PER TABLET    Take 1 tablet by mouth daily.   VALSARTAN-HYDROCHLOROTHIAZIDE (DIOVAN-HCT) 320-25 MG PER TABLET    TAKE 1 TABLET EVERY DAY TO CONTROL BLOOD PRESSURE     Review of Systems  Constitutional: Positive for activity change. Negative for fever, chills, diaphoresis, appetite change, fatigue and unexpected weight change.  HENT: Negative.   Eyes:       Corrective lenses.   Respiratory: Negative.   Cardiovascular: Positive for leg swelling (at right knee). Negative for chest pain and palpitations.  Gastrointestinal:       Frequent reflux and heartburn.  Endocrine:       History of elevations in glucose. Diet controlled.  Genitourinary: Positive for frequency.       Urinary leakage,    Musculoskeletal:       Chronic back pains. Right knee painful. Had surgery 07/04/13 by Dr. Ninfa Linden. Using tramadol. Having some left neck discomfort. Had manipulation of the right knee on 10/23/13.  Skin: Negative.   Neurological: Positive for numbness. Negative for dizziness, tremors, seizures, syncope, facial asymmetry, speech difficulty, weakness, light-headedness and headaches.       Episodes of numbness in the right hand.  Hematological: Negative.   Psychiatric/Behavioral: Negative.        Some difficulty falling asleep. Using less Ambien now.    Filed Vitals:   01/27/14 1608  BP: 128/76  Pulse: 63  Resp: 12  Weight: 183 lb (83.008 kg)  SpO2: 99%   Physical Exam  Constitutional: She is oriented to person, place, and time. No distress.  Obese.  HENT:  Head: Normocephalic and atraumatic.  Right Ear: External ear normal.  Left Ear: External ear normal.  Eyes: Conjunctivae and EOM are normal. Pupils are equal, round, and reactive to light. Left eye exhibits no discharge. No scleral icterus.  Neck: Normal range of motion. Neck supple. No JVD present. No tracheal deviation present. No thyromegaly present.  Cardiovascular: Normal rate, regular rhythm and intact distal pulses.  Exam reveals no gallop and no friction rub.   No murmur heard. Pulmonary/Chest: Breath sounds normal. No respiratory distress. She has no wheezes. She has no rales. She exhibits no tenderness.  Abdominal: Soft. Bowel sounds are normal. She exhibits no distension and no mass. There is no tenderness.  Musculoskeletal: She exhibits tenderness. She exhibits no edema.  Right knee is recovering from TKR. Able to flex about 110 degrees. Using a cane. Mid tenderness left neck musculature.  Lymphadenopathy:    She has no cervical adenopathy.  Neurological: She is alert and oriented to person, place, and time. No cranial nerve deficit. Coordination normal.  Skin: No rash noted. No erythema. No pallor.  Psychiatric:  She has a normal mood and affect. Her behavior is normal. Judgment and thought content normal.     Labs reviewed: No visits with results within 3 Month(s) from this visit. Latest known visit with results is:  Admission on 10/23/2013, Discharged on 10/23/2013  Component Date Value Range Status  . WBC 10/23/2013 8.5  4.0 - 10.5 K/uL Final  . RBC 10/23/2013 4.46  3.87 - 5.11 MIL/uL Final  . Hemoglobin 10/23/2013 13.5  12.0 - 15.0 g/dL Final  . HCT 10/23/2013 40.0  36.0 - 46.0 % Final  .  MCV 10/23/2013 89.7  78.0 - 100.0 fL Final  . MCH 10/23/2013 30.3  26.0 - 34.0 pg Final  . MCHC 10/23/2013 33.8  30.0 - 36.0 g/dL Final  . RDW 10/23/2013 13.2  11.5 - 15.5 % Final  . Platelets 10/23/2013 162  150 - 400 K/uL Final  . Sodium 10/23/2013 138  135 - 145 mEq/L Final  . Potassium 10/23/2013 3.6  3.5 - 5.1 mEq/L Final  . Chloride 10/23/2013 102  96 - 112 mEq/L Final  . CO2 10/23/2013 28  19 - 32 mEq/L Final  . Glucose, Bld 10/23/2013 99  70 - 99 mg/dL Final  . BUN 10/23/2013 19  6 - 23 mg/dL Final  . Creatinine, Ser 10/23/2013 0.71  0.50 - 1.10 mg/dL Final  . Calcium 10/23/2013 9.4  8.4 - 10.5 mg/dL Final  . GFR calc non Af Amer 10/23/2013 79* >90 mL/min Final  . GFR calc Af Amer 10/23/2013 >90  >90 mL/min Final   Comment: (NOTE)                          The eGFR has been calculated using the CKD EPI equation.                          This calculation has not been validated in all clinical situations.                          eGFR's persistently <90 mL/min signify possible Chronic Kidney                          Disease.      Assessment/Plan  Insomnia, unspecified - Plan: zolpidem (AMBIEN) 10 MG tablet  HYPERTENSION - Plan: CMP  GERD: asymptomatic  Arthritis of knee, right - Plan: traMADol (ULTRAM) 50 MG tablet  Other dysphagia: improved  ANXIETY -continue ALPRAZolam (XANAX) 0.25 MG tablet  Other and unspecified hyperlipidemia - Plan: Lipid panel

## 2014-01-27 NOTE — Patient Instructions (Signed)
Continue current medications. 

## 2014-02-02 ENCOUNTER — Other Ambulatory Visit: Payer: Self-pay | Admitting: Obstetrics and Gynecology

## 2014-02-12 ENCOUNTER — Encounter: Payer: Self-pay | Admitting: Internal Medicine

## 2014-02-25 ENCOUNTER — Encounter: Payer: Self-pay | Admitting: Internal Medicine

## 2014-03-09 ENCOUNTER — Other Ambulatory Visit: Payer: Self-pay | Admitting: Internal Medicine

## 2014-03-31 ENCOUNTER — Encounter: Payer: Self-pay | Admitting: Internal Medicine

## 2014-04-27 ENCOUNTER — Other Ambulatory Visit: Payer: Medicare Other

## 2014-04-27 DIAGNOSIS — R7989 Other specified abnormal findings of blood chemistry: Secondary | ICD-10-CM

## 2014-04-27 DIAGNOSIS — E039 Hypothyroidism, unspecified: Secondary | ICD-10-CM

## 2014-04-27 DIAGNOSIS — I10 Essential (primary) hypertension: Secondary | ICD-10-CM

## 2014-04-28 LAB — BASIC METABOLIC PANEL
BUN/Creatinine Ratio: 23 (ref 11–26)
BUN: 17 mg/dL (ref 8–27)
CO2: 24 mmol/L (ref 18–29)
Calcium: 8.9 mg/dL (ref 8.7–10.3)
Chloride: 102 mmol/L (ref 97–108)
Creatinine, Ser: 0.73 mg/dL (ref 0.57–1.00)
GFR calc non Af Amer: 78 mL/min/{1.73_m2} (ref >59)
GFR, EST AFRICAN AMERICAN: 90 mL/min/{1.73_m2} (ref >59)
GLUCOSE: 91 mg/dL (ref 65–99)
POTASSIUM: 3.8 mmol/L (ref 3.5–5.2)
SODIUM: 141 mmol/L (ref 134–144)

## 2014-04-28 LAB — HEMOGLOBIN A1C
ESTIMATED AVERAGE GLUCOSE: 114 mg/dL
Hgb A1c MFr Bld: 5.6 % (ref 4.8–5.6)

## 2014-04-29 ENCOUNTER — Ambulatory Visit (INDEPENDENT_AMBULATORY_CARE_PROVIDER_SITE_OTHER): Payer: Medicare Other | Admitting: Nurse Practitioner

## 2014-04-29 ENCOUNTER — Encounter: Payer: Self-pay | Admitting: Nurse Practitioner

## 2014-04-29 VITALS — BP 140/82 | HR 56 | Temp 98.0°F | Resp 10 | Ht 62.0 in | Wt 185.0 lb

## 2014-04-29 DIAGNOSIS — R7309 Other abnormal glucose: Secondary | ICD-10-CM

## 2014-04-29 DIAGNOSIS — K219 Gastro-esophageal reflux disease without esophagitis: Secondary | ICD-10-CM

## 2014-04-29 DIAGNOSIS — F411 Generalized anxiety disorder: Secondary | ICD-10-CM

## 2014-04-29 DIAGNOSIS — G4700 Insomnia, unspecified: Secondary | ICD-10-CM

## 2014-04-29 DIAGNOSIS — E7800 Pure hypercholesterolemia, unspecified: Secondary | ICD-10-CM

## 2014-04-29 DIAGNOSIS — R739 Hyperglycemia, unspecified: Secondary | ICD-10-CM

## 2014-04-29 DIAGNOSIS — I10 Essential (primary) hypertension: Secondary | ICD-10-CM

## 2014-04-29 MED ORDER — ZOSTER VACCINE LIVE 19400 UNT/0.65ML ~~LOC~~ SOLR: 0.6500 mL | Freq: Once | SUBCUTANEOUS | Status: DC

## 2014-04-29 NOTE — Progress Notes (Signed)
Patient ID: [name], female   DOB: 09-Dec-1931, 80 y.o.   MRN: [id number]    Allergies  Allergen Reactions  . Advil [Ibuprofen]     GI upset  . Aleve [Naproxen Sodium]     GI upset  . Aspirin Nausea And Vomiting    Takes baby aspirin qod without problems  . Celebrex [Celecoxib]     GI upset  . Diclofenac     GI upset  . Metoclopramide Hcl     REACTION: heart palps  . Nsaids Other (See Comments)    Tears my stomach up   . Other     Antihistamine - increase BP  . Timolol     Dropped HR    Chief Complaint  Patient presents with  . Medical Management of Chronic Issues    3 month follow-up, discuss recent labs (copy printed)   . Screening    6CIT Dementia test completed- patient within normal limits scored max of 28     HPI: Patient is a 80 y.o. female seen in the office today for routine follow.  Has lost weight since TKR -having to use pain medications which upsets stomach  HR has always ran low per pt, had cardiology follow in the past Working on balance and gait with exercises therapy gave her from TKA at least 3 times a week -no acute issues at this time  Review of Systems:  Review of Systems  Constitutional: Negative for fever, chills, diaphoresis, appetite change, fatigue and unexpected weight change.  HENT: Negative.   Eyes:       Corrective lenses.   Respiratory: Negative.   Cardiovascular: Positive for leg swelling (at right knee). Negative for chest pain and palpitations.  Gastrointestinal:       Frequent reflux and heartburn-controlled with omeprazole- which is only takes occasionally   Endocrine:       History of elevations in glucose. Diet controlled.  Genitourinary: Positive for frequency.       Urinary leakage,  Musculoskeletal:       Chronic back pains--this has been stable; conts with tramadol as needed. Right knee painful. Had surgery 07/04/13 by Dr. Ninfa Linden. Using tramadol. Having some left neck discomfort. Had manipulation of the right  knee on 10/23/13.   Skin: Negative.   Neurological: Positive for numbness. Negative for dizziness, tremors, seizures, syncope, facial asymmetry, speech difficulty, weakness, light-headedness and headaches.       Episodes of numbness in the right hand.  Hematological: Negative.   Psychiatric/Behavioral: Negative.  Nervous/anxious: at times, no on-going anxiety or depression.        Conts using less Ambien for sleep-- which helps      Past Medical History  Diagnosis Date  . Anxiety state, unspecified   . Backache, unspecified   . Diverticulosis of colon (without mention of hemorrhage)   . Stricture and stenosis of esophagus   . Esophageal reflux   . Enthesopathy of hip region   . Diaphragmatic hernia without mention of obstruction or gangrene   . Pain in joint, pelvic region and thigh   . Pure hypercholesterolemia   . Unspecified essential hypertension   . Elevated blood pressure reading without diagnosis of hypertension   . Pain in limb   . Other dysphagia   . Cramp of limb   . Chest pain, unspecified   . Other nonspecific abnormal serum enzyme levels   . Hepatitis, unspecified   . Disturbance of skin sensation   . Unspecified vitamin D  deficiency   . Pain in joint, hand   . Other abnormal blood chemistry   . Pain in joint, ankle and foot   . Plantar fascial fibromatosis   . Unspecified hypothyroidism   . Asymptomatic varicose veins   . Allergic rhinitis due to pollen   . Myalgia and myositis, unspecified   . Pain in joint, lower leg   . Sciatica   . Reflux esophagitis   . Anxiety state, unspecified   . Depressive disorder, not elsewhere classified   . Other specified cardiac dysrhythmias(427.89)   . Other malaise and fatigue   . Insomnia, unspecified   . Carpal tunnel syndrome   . Spinal stenosis, unspecified region other than cervical   . Lumbago   . Symptomatic menopausal or female climacteric states   . Unspecified essential hypertension   . Headache(784.0)     . Other and unspecified hyperlipidemia   . Obesity, unspecified   . Diaphragmatic hernia without mention of obstruction or gangrene   . Peptic ulcer, unspecified site, unspecified as acute or chronic, without mention of hemorrhage, perforation, or obstruction    Past Surgical History  Procedure Laterality Date  . Cholecystectomy  1989    DR BOWMAN  . Abdominal hysterectomy  1979  . Knee surgery  2003  . Breast biopsy      twice  . Excision of actinic keratosis      DR LUPTON   . O.s. cataract removed  08/03/1999    DR EPES   . O.d. cataract removed  2000    DR EPES  . Laser for cloudy cap left eye  03/2006    DR DIGBY  . Replacement total knee Right 04/2006    DR RENDALL  . Replacement total knee Left 04/2004    DR RENDALL  . Knee arthroscopy Right 06-26-13  . Total knee arthroplasty Right 07/04/2013    Procedure: RIGHT TOTAL KNEE ARTHROPLASTY;  Surgeon: Mcarthur Rossetti, MD;  Location: [facility];  Service: Orthopedics;  Laterality: Right;  . Knee closed reduction Right 10/23/2013    Procedure: CLOSED MANIPULATION RIGHT KNEE;  Surgeon: Mcarthur Rossetti, MD;  Location: [place];  Service: Orthopedics;  Laterality: Right;  . Colonoscopy  1988    Normal    Social History:   reports that she has never smoked. She has never used smokeless tobacco. She reports that she does not drink alcohol or use illicit drugs.  Family History  Problem Relation Age of Onset  . Ovarian cancer Mother   . Uterine cancer Mother   . Heart disease Father   . Cerebrovascular Accident Mother   . Hypertension Brother   . Obesity Daughter     Medications: Patient's Medications  New Prescriptions   No medications on file  Previous Medications   ALPRAZOLAM (XANAX) 0.25 MG TABLET    Take 0.5 tablets (0.125 mg total) by mouth at bedtime as needed for anxiety.   ATORVASTATIN (LIPITOR) 10 MG TABLET    TAKE 1 TABLET BY MOUTH EVERY DAY TO CONTROL CHOLESTEROL   AZELASTINE (OPTIVAR) 0.05 %  OPHTHALMIC SOLUTION    Place 1 drop into both eyes 2 (two) times daily.    BIMATOPROST (LUMIGAN) 0.01 % SOLN    Instill 1 drop into both eyes once daily in the evening   CHOLECALCIFEROL (VITAMIN D) 2000 UNITS TABLET    Take 2,000 Units by mouth daily.   DICLOFENAC SODIUM (VOLTAREN) 1 % GEL    Apply 2 g topically daily as  needed (for knee pain).    DORZOLAMIDE (TRUSOPT) 2 % OPHTHALMIC SOLUTION    Place 1 drop into both eyes 3 (three) times daily.    GLUCOSAMINE-CHONDROITIN 500-400 MG TABLET    Take 2 tablets by mouth daily.    METHYLCELLULOSE (ARTIFICIAL TEARS) 1 % OPHTHALMIC SOLUTION    Place 1 drop into both eyes daily as needed (for dry eyes).    OMEPRAZOLE (PRILOSEC) 40 MG CAPSULE    TAKE ONE CAPSULE BY MOUTH EVERY DAY FOR ACID REDUCTION   TRAMADOL (ULTRAM) 50 MG TABLET    Take 1-2 tablets (50-100 mg total) by mouth daily as needed.   VALSARTAN-HYDROCHLOROTHIAZIDE (DIOVAN-HCT) 320-25 MG PER TABLET    TAKE 1 TABLET EVERY DAY TO CONTROL BLOOD PRESSURE   VITAMIN E 400 UNIT CAPSULE    Take 400 Units by mouth daily.   ZOLPIDEM (AMBIEN) 10 MG TABLET    Take 0.5 tablets (5 mg total) by mouth at bedtime as needed for sleep.  Modified Medications   No medications on file  Discontinued Medications   BETAMETHASONE VALERATE OINTMENT (VALISONE) 0.1 %    Apply 1 application topically 2 (two) times daily as needed (itching). A     Physical Exam:  Filed Vitals:   04/29/14 1605  BP: 140/82  Pulse: 56  Temp: 98 F (36.7 C)  TempSrc: Oral  Resp: 10  Height: 5\' 2"  (1.575 m)  Weight: 185 lb (83.915 kg)    Physical Exam  Constitutional: She is oriented to person, place, and time and well-developed, well-nourished, and in no distress. No distress.  HENT:  Mouth/Throat: Oropharynx is clear and moist. No oropharyngeal exudate.  Eyes: Conjunctivae and EOM are normal. Pupils are equal, round, and reactive to light.  Neck: Normal range of motion. Neck supple.  Cardiovascular: Normal rate, regular  rhythm and normal heart sounds.   Pulmonary/Chest: Effort normal and breath sounds normal.  Abdominal: Soft. Bowel sounds are normal.  Musculoskeletal: Normal range of motion. She exhibits no edema and no tenderness.  Neurological: She is alert and oriented to person, place, and time. No cranial nerve deficit. Gait normal.  Skin: Skin is warm and dry.  Psychiatric: Affect normal.     Labs reviewed: Basic Metabolic Panel:  Recent Labs  10/01/13 1723 10/23/13 0718 04/27/14 1039  NA 140 138 141  K 4.0 3.6 3.8  CL 99 102 102  CO2 29 28 24   GLUCOSE 90 99 91  BUN 19 19 17   CREATININE 0.73 0.71 0.73  CALCIUM 9.5 9.4 8.9   Liver Function Tests: No results found for this basename: AST, ALT, ALKPHOS, BILITOT, PROT, ALBUMIN,  in the last 8760 hours No results found for this basename: LIPASE, AMYLASE,  in the last 8760 hours No results found for this basename: AMMONIA,  in the last 8760 hours CBC:  Recent Labs  07/07/13 0447 07/08/13 0430 10/01/13 1723 10/23/13 0718  WBC 6.7 6.3 7.2 8.5  NEUTROABS  --   --  3.6  --   HGB 7.2* 9.4* 12.5 13.5  HCT 21.7* 27.0* 38.1 40.0  MCV 90.4 89.1 91 89.7  PLT 118* 156  --  162   Lipid Panel:  Recent Labs  10/01/13 1723  HDL 40  LDLCALC 74  TRIG 180*  CHOLHDL 3.8   TSH: No results found for this basename: TSH,  in the last 8760 hours A1C: Lab Results  Component Value Date   HGBA1C 5.6 04/27/2014    Assessment/Plan 1. GERD -without symptoms  2. ANXIETY -well controlled with PRN xanax  3. HYPERCHOLESTEROLEMIA -working on diet modifications, conts exercise routinely - Comprehensive metabolic panel; Future - Lipid panel; Future  4. Insomnia, unspecified -controlled on Ambien   5. Hyperglycemia -at one time with elevations, now a1c 5.6  6. Essential hypertension, benign -controlled on valsartan-hctz - CBC With differential/Platelet; Future  7. Arthritis of knee -does not need assistive device, no falls in over a  year -conts to exercise routinely -doing well on minimal medication   Follow up in 5 months with fasting blood work prior to visit -goes to GYN routinely -goes to dermatology for skin evaluation -goes to orthopedic for arthritis

## 2014-04-29 NOTE — Patient Instructions (Signed)
Doing well- without changes, will follow up in 5 months with fasting blood work prior to visit

## 2014-04-30 ENCOUNTER — Encounter: Payer: Self-pay | Admitting: *Deleted

## 2014-05-18 ENCOUNTER — Encounter: Payer: Self-pay | Admitting: Internal Medicine

## 2014-08-21 ENCOUNTER — Other Ambulatory Visit: Payer: Self-pay | Admitting: Nurse Practitioner

## 2014-09-22 ENCOUNTER — Encounter: Payer: Self-pay | Admitting: Internal Medicine

## 2014-09-22 ENCOUNTER — Ambulatory Visit
Admission: RE | Admit: 2014-09-22 | Discharge: 2014-09-22 | Disposition: A | Discharge status: Home / Self Care | Payer: Medicare Other | Source: Ambulatory Visit | Attending: Internal Medicine | Admitting: Internal Medicine

## 2014-09-22 ENCOUNTER — Ambulatory Visit (INDEPENDENT_AMBULATORY_CARE_PROVIDER_SITE_OTHER): Payer: Medicare Other | Admitting: Internal Medicine

## 2014-09-22 VITALS — BP 128/80 | HR 67 | Temp 97.9°F | Resp 12 | Wt 185.0 lb

## 2014-09-22 DIAGNOSIS — R059 Cough, unspecified: Secondary | ICD-10-CM

## 2014-09-22 DIAGNOSIS — R0989 Other specified symptoms and signs involving the circulatory and respiratory systems: Secondary | ICD-10-CM

## 2014-09-22 DIAGNOSIS — R05 Cough: Secondary | ICD-10-CM

## 2014-09-22 DIAGNOSIS — R1013 Epigastric pain: Secondary | ICD-10-CM

## 2014-09-22 MED ORDER — ALBUTEROL SULFATE HFA 108 (90 BASE) MCG/ACT IN AERS: 1.0000 | INHALATION_SPRAY | Freq: Four times a day (QID) | RESPIRATORY_TRACT | Status: DC | PRN

## 2014-09-22 MED ORDER — ESOMEPRAZOLE MAGNESIUM 40 MG PO CPDR: 40.0000 mg | DELAYED_RELEASE_CAPSULE | Freq: Two times a day (BID) | ORAL | Status: DC

## 2014-09-22 NOTE — Progress Notes (Signed)
Patient ID: [name], female   DOB: Nov 05, 1931, 80 y.o.   MRN: [id number]    Chief Complaint  Patient presents with  . Acute Visit    Not feeling well - lots of congestion, nausea, wheezing, back of throat feels very full.    Allergies  Allergen Reactions  . Advil [Ibuprofen]     GI upset  . Aleve [Naproxen Sodium]     GI upset  . Aspirin Nausea And Vomiting    Takes baby aspirin qod without problems  . Celebrex [Celecoxib]     GI upset  . Diclofenac     GI upset  . Metoclopramide Hcl     REACTION: heart palps  . Nsaids Other (See Comments)    Tears my stomach up   . Other     Antihistamine - increase BP  . Timolol     Dropped HR   HPI 80 y/o here with acute concerns She had a sinus infection and had treatment a month back. She was seen by ENT. She still has cough with mucus that she has not been able to cough up She feels she has a wheezing cough  She feels her voice is hoarse Taking mucinex with some help She started taking omeprazole 40 mg daily and this helps for few hours but the symptoms surface back She saw dr Dayle Points 3 weeks back for follow up after completing her z pack course. She was advised on mucinex for her cough.  Review of Systems  Constitutional: Negative for fever, chills. She feels weary over past 6 weeks  HENT: positive for congestion that has worsened recently. Denies sore throat. Feels the need to clear her throat constantly. No earache, drainage or hearing loss    Eyes: Negative for blurred vision Respiratory: has dry cough, no sputum production. Feels some chest tightness and wheezing. She tends to gasp for air after bouts of cough Cardiovascular: Negative for chest pain, palpitations.  Gastrointestinal: has some heartburn, omeprazole has helped her. Negative for vomiting, abdominal pain. Denies problem swallowing food  Skin: Negative for itching and rash.  Psychiatric/Behavioral: Negative for depression.    Wt Readings from Last 3  Encounters:  09/22/14 185 lb (83.915 kg)  04/29/14 185 lb (83.915 kg)  01/27/14 183 lb (83.008 kg)   Past Medical History  Diagnosis Date  . Anxiety state, unspecified   . Backache, unspecified   . Diverticulosis of colon (without mention of hemorrhage)   . Stricture and stenosis of esophagus   . Esophageal reflux   . Enthesopathy of hip region   . Diaphragmatic hernia without mention of obstruction or gangrene   . Pain in joint, pelvic region and thigh   . Pure hypercholesterolemia   . Unspecified essential hypertension   . Elevated blood pressure reading without diagnosis of hypertension   . Pain in limb   . Other dysphagia   . Cramp of limb   . Chest pain, unspecified   . Other nonspecific abnormal serum enzyme levels   . Hepatitis, unspecified   . Disturbance of skin sensation   . Unspecified vitamin D deficiency   . Pain in joint, hand   . Other abnormal blood chemistry   . Pain in joint, ankle and foot   . Plantar fascial fibromatosis   . Unspecified hypothyroidism   . Asymptomatic varicose veins   . Allergic rhinitis due to pollen   . Myalgia and myositis, unspecified   . Pain in joint, lower leg   .  Sciatica   . Reflux esophagitis   . Anxiety state, unspecified   . Depressive disorder, not elsewhere classified   . Other specified cardiac dysrhythmias(427.89)   . Other malaise and fatigue   . Insomnia, unspecified   . Carpal tunnel syndrome   . Spinal stenosis, unspecified region other than cervical   . Lumbago   . Symptomatic menopausal or female climacteric states   . Unspecified essential hypertension   . Headache(784.0)   . Other and unspecified hyperlipidemia   . Obesity, unspecified   . Diaphragmatic hernia without mention of obstruction or gangrene   . Peptic ulcer, unspecified site, unspecified as acute or chronic, without mention of hemorrhage, perforation, or obstruction    Current Outpatient Prescriptions on File Prior to Visit  Medication Sig  Dispense Refill  . ALPRAZolam (XANAX) 0.25 MG tablet Take 0.5 tablets (0.125 mg total) by mouth at bedtime as needed for anxiety.  30 tablet  3  . atorvastatin (LIPITOR) 10 MG tablet LABS DUE, 1 by mouth daily to control cholesterol  30 tablet  1  . azelastine (OPTIVAR) 0.05 % ophthalmic solution Place 1 drop into both eyes 2 (two) times daily.       . bimatoprost (LUMIGAN) 0.01 % SOLN Instill 1 drop into both eyes once daily in the evening      . Cholecalciferol (VITAMIN D) 2000 UNITS tablet Take 2,000 Units by mouth daily.      . diclofenac sodium (VOLTAREN) 1 % GEL Apply 2 g topically daily as needed (for knee pain).       . dorzolamide (TRUSOPT) 2 % ophthalmic solution Place 1 drop into both eyes 3 (three) times daily.       Marland Kitchen glucosamine-chondroitin 500-400 MG tablet Take 2 tablets by mouth daily.       . methylcellulose (ARTIFICIAL TEARS) 1 % ophthalmic solution Place 1 drop into both eyes daily as needed (for dry eyes).       . traMADol (ULTRAM) 50 MG tablet Take 1-2 tablets (50-100 mg total) by mouth daily as needed.  60 tablet  3  . valsartan-hydrochlorothiazide (DIOVAN-HCT) 320-25 MG per tablet TAKE 1 TABLET EVERY DAY TO CONTROL BLOOD PRESSURE  30 tablet  5  . vitamin E 400 UNIT capsule Take 400 Units by mouth daily.      Marland Kitchen zolpidem (AMBIEN) 10 MG tablet Take 0.5 tablets (5 mg total) by mouth at bedtime as needed for sleep.  30 tablet  3   No current facility-administered medications on file prior to visit.   Physical exam BP 128/80  Pulse 67  Temp(Src) 97.9 F (36.6 C) (Oral)  Resp 12  Wt 185 lb (83.915 kg)  SpO2 97%  General- elderly female in no acute distress Head- atraumatic, normocephalic Eyes- PERRLA, EOMI, no pallor, no icterus Neck- no lymphadenopathy, no thyromegaly Oropharynx- mild erythema, no exudates, no cervical lymphadenopathy Cardiovascular- normal s1,s2, no murmurs Respiratory- bilateral clear to auscultation, no wheeze, no rhonchi, no crackles Abdomen-  bowel sounds present, soft, non tender Musculoskeletal- able to move all 4 extremities Neurological- no focal deficit Psychiatry- alert and oriented to person, place and time, normal mood and affect  Assessment/plan  1. Epigastric discomfort With ppi helping her symptom for few hours and her having hx of hiatal hernia, will rule out h.pylori infection. Also change omeprazole to esomeprazole 40 mg bid for now - H. pylori Antibody, IgG  2. Chest congestion No specific finding on exam. Get cxr to assess further on her chronic  cough and congestion - DG Chest 2 View; Future  3. Cough Possible from her gerd and chronic sinusitis. Increasing dose of PPI. Add prn albuterol to see if this with bronchodilation helps with the symptom for possible reactive airway disease. No signs of acute sinusitis on exam. Get cxr to assess further.  - DG Chest 2 View; Future

## 2014-09-23 LAB — H. PYLORI ANTIBODY, IGG

## 2014-09-25 ENCOUNTER — Other Ambulatory Visit: Payer: Medicare Other

## 2014-09-25 ENCOUNTER — Telehealth: Payer: Self-pay | Admitting: *Deleted

## 2014-09-25 ENCOUNTER — Ambulatory Visit (INDEPENDENT_AMBULATORY_CARE_PROVIDER_SITE_OTHER): Payer: Medicare Other

## 2014-09-25 DIAGNOSIS — I10 Essential (primary) hypertension: Secondary | ICD-10-CM

## 2014-09-25 DIAGNOSIS — Z23 Encounter for immunization: Secondary | ICD-10-CM

## 2014-09-25 DIAGNOSIS — E785 Hyperlipidemia, unspecified: Secondary | ICD-10-CM

## 2014-09-25 NOTE — Telephone Encounter (Signed)
Message copied by Eilene Ghazi on Fri Sep 25, 2014  8:50 AM ------      Message from: Blanchie Serve      Created: Thu Sep 24, 2014  6:15 PM       Chest xray appears normal ------

## 2014-09-25 NOTE — Telephone Encounter (Signed)
Spoke with patient regarding her lab for her stomach discomfort, I explained to her that her lab came back negative for H. Pyloric.

## 2014-09-25 NOTE — Telephone Encounter (Signed)
Message copied by Eilene Ghazi on Fri Sep 25, 2014  8:52 AM ------      Message from: Blanchie Serve      Created: Thu Sep 24, 2014  6:10 PM       No bacterial growth in stomach ------

## 2014-09-25 NOTE — Telephone Encounter (Signed)
Spoke with patient regarding her chest x-ray and she stated that she understood the results and she would speak further with Dr. Nyoka Cowden on Tuesday.

## 2014-09-26 LAB — COMPREHENSIVE METABOLIC PANEL
ALT: 16 IU/L (ref 0–32)
AST: 20 IU/L (ref 0–40)
Albumin/Globulin Ratio: 1.7 (ref 1.1–2.5)
Albumin: 4.1 g/dL (ref 3.5–4.7)
Alkaline Phosphatase: 49 IU/L (ref 39–117)
BILIRUBIN TOTAL: 0.6 mg/dL (ref 0.0–1.2)
BUN/Creatinine Ratio: 14 (ref 11–26)
BUN: 11 mg/dL (ref 8–27)
CHLORIDE: 101 mmol/L (ref 97–108)
CO2: 27 mmol/L (ref 18–29)
Calcium: 9.2 mg/dL (ref 8.7–10.3)
Creatinine, Ser: 0.79 mg/dL (ref 0.57–1.00)
GFR calc Af Amer: 81 mL/min/{1.73_m2} (ref >59)
GFR, EST NON AFRICAN AMERICAN: 70 mL/min/{1.73_m2} (ref >59)
GLUCOSE: 101 mg/dL — AB (ref 65–99)
Globulin, Total: 2.4 g/dL (ref 1.5–4.5)
POTASSIUM: 3.6 mmol/L (ref 3.5–5.2)
Sodium: 141 mmol/L (ref 134–144)
TOTAL PROTEIN: 6.5 g/dL (ref 6.0–8.5)

## 2014-09-26 LAB — LIPID PANEL
CHOLESTEROL TOTAL: 135 mg/dL (ref 100–199)
Chol/HDL Ratio: 3.3 ratio units (ref 0.0–4.4)
HDL: 41 mg/dL (ref >39)
LDL Calculated: 66 mg/dL (ref 0–99)
Triglycerides: 142 mg/dL (ref 0–149)
VLDL CHOLESTEROL CAL: 28 mg/dL (ref 5–40)

## 2014-09-29 ENCOUNTER — Encounter: Payer: Self-pay | Admitting: Internal Medicine

## 2014-09-29 ENCOUNTER — Ambulatory Visit (INDEPENDENT_AMBULATORY_CARE_PROVIDER_SITE_OTHER): Payer: Medicare Other | Admitting: Internal Medicine

## 2014-09-29 VITALS — BP 126/88 | HR 59 | Temp 97.5°F | Resp 10 | Ht 62.0 in | Wt 188.0 lb

## 2014-09-29 DIAGNOSIS — K2100 Gastro-esophageal reflux disease with esophagitis, without bleeding: Secondary | ICD-10-CM

## 2014-09-29 DIAGNOSIS — M1711 Unilateral primary osteoarthritis, right knee: Secondary | ICD-10-CM

## 2014-09-29 DIAGNOSIS — J029 Acute pharyngitis, unspecified: Secondary | ICD-10-CM

## 2014-09-29 DIAGNOSIS — K222 Esophageal obstruction: Secondary | ICD-10-CM

## 2014-09-29 DIAGNOSIS — M542 Cervicalgia: Secondary | ICD-10-CM

## 2014-09-29 DIAGNOSIS — G4700 Insomnia, unspecified: Secondary | ICD-10-CM

## 2014-09-29 DIAGNOSIS — I10 Essential (primary) hypertension: Secondary | ICD-10-CM

## 2014-09-29 DIAGNOSIS — F411 Generalized anxiety disorder: Secondary | ICD-10-CM

## 2014-09-29 DIAGNOSIS — M129 Arthropathy, unspecified: Secondary | ICD-10-CM

## 2014-09-29 DIAGNOSIS — R609 Edema, unspecified: Secondary | ICD-10-CM

## 2014-09-29 DIAGNOSIS — R1314 Dysphagia, pharyngoesophageal phase: Secondary | ICD-10-CM

## 2014-09-29 MED ORDER — ZOLPIDEM TARTRATE 10 MG PO TABS: 5.0000 mg | ORAL_TABLET | Freq: Every evening | ORAL | Status: DC | PRN

## 2014-09-29 MED ORDER — ALPRAZOLAM 0.25 MG PO TABS: 0.1250 mg | ORAL_TABLET | Freq: Every evening | ORAL | Status: DC | PRN

## 2014-09-29 MED ORDER — DOXYCYCLINE HYCLATE 100 MG PO TABS: ORAL_TABLET | ORAL | Status: DC

## 2014-09-29 MED ORDER — PREDNISONE (PAK) 5 MG PO TABS: ORAL_TABLET | ORAL | Status: DC

## 2014-09-30 LAB — CBC WITH DIFFERENTIAL
BASOS: 0 %
Basophils Absolute: 0 10*3/uL (ref 0.0–0.2)
EOS: 3 %
Eosinophils Absolute: 0.2 10*3/uL (ref 0.0–0.4)
HCT: 37.5 % (ref 34.0–46.6)
Hemoglobin: 12.3 g/dL (ref 11.1–15.9)
IMMATURE GRANULOCYTES: 0 %
Immature Grans (Abs): 0 10*3/uL (ref 0.0–0.1)
LYMPHS ABS: 2.4 10*3/uL (ref 0.7–3.1)
Lymphs: 36 %
MCH: 29.6 pg (ref 26.6–33.0)
MCHC: 32.8 g/dL (ref 31.5–35.7)
MCV: 90 fL (ref 79–97)
MONOCYTES: 8 %
MONOS ABS: 0.5 10*3/uL (ref 0.1–0.9)
Neutrophils Absolute: 3.6 10*3/uL (ref 1.4–7.0)
Neutrophils Relative %: 53 %
PLATELETS: 172 10*3/uL (ref 150–379)
RBC: 4.15 x10E6/uL (ref 3.77–5.28)
RDW: 13.7 % (ref 12.3–15.4)
WBC: 6.8 10*3/uL (ref 3.4–10.8)

## 2014-10-02 ENCOUNTER — Telehealth: Payer: Self-pay | Admitting: *Deleted

## 2014-10-02 NOTE — Telephone Encounter (Signed)
Call patient to regarding her labs, left message that labs was normal and to call if she had any questions.

## 2014-10-02 NOTE — Telephone Encounter (Signed)
Message copied by Eilene Ghazi on Fri Oct 02, 2014  4:11 PM ------      Message from: Estill Dooms      Created: Fri Oct 02, 2014  1:23 PM       Call patient. CBC was normal. ------

## 2014-10-04 NOTE — Progress Notes (Signed)
Patient ID: [name], female   DOB: 1931/08/10, 80 y.o.   MRN: [id number]    Facility  [facility]    Place of Service:   OFFICE   Allergies  Allergen Reactions  . Advil [Ibuprofen]     GI upset  . Aleve [Naproxen Sodium]     GI upset  . Aspirin Nausea And Vomiting    Takes baby aspirin qod without problems  . Celebrex [Celecoxib]     GI upset  . Diclofenac     GI upset  . Metoclopramide Hcl     REACTION: heart palps  . Nsaids Other (See Comments)    Tears my stomach up   . Other     Antihistamine - increase BP  . Timolol     Dropped HR    Chief Complaint  Patient presents with  . Medical Management of Chronic Issues    4 Month folow-up, discuss labs (copy printed)   . Medication Management    Discuss Nexium instructions  . URI    Ongoing cough and congestion     HPI:  Acute pharyngitis, unspecified pharyngitis type -respiratory symptoms since June 2015 and possible sinus infection for the last several weeks. Patient has seen Dr. Ernesto Rutherford. She was given prescriptions for Z-Pak twice. She complains of continuing problems of mucus in her throat. She has a dry cough, hoarseness, and trouble swallowing her mucus. She has been given Nexium for possible reflux problems and subsequent respiratory symptoms.  Anxiety state - benefits from ALPRAZolam (XANAX) 0.25 MG tablet  Insomnia - benefits from zolpidem (AMBIEN) 10 MG tablet  ESOPHAGEAL STRICTURE: Denies difficulty presently with swallowing  Essential hypertension: Controlled  Gastroesophageal reflux disease with esophagitis: Controlled  Cervicalgia: Chronic discomfort in the neck thought related to osteoarthritis of the cervical spine  Edema: Unchanged; 1+ bipedal edema  Arthritis of knee, right: Residual discomfort of the right knee with ambulation  Primary osteoarthritis of right knee: Residual discomfort of the right knee with ambulation  Dysphagia, pharyngoesophageal phase    Medications: Patient's  Medications  New Prescriptions   DOXYCYCLINE (VIBRA-TABS) 100 MG TABLET    One twice daily for infection   PREDNISONE (STERAPRED UNI-PAK) 5 MG TABS TABLET    6 tablets day 1, 5 on day 2, 4 on day 3, 3 on day 4, 2 on day 5, and 1 on day 6.  Previous Medications   ALBUTEROL (PROVENTIL HFA;VENTOLIN HFA) 108 (90 BASE) MCG/ACT INHALER    Inhale 1 puff into the lungs every 6 (six) hours as needed for wheezing or shortness of breath.   ATORVASTATIN (LIPITOR) 10 MG TABLET    LABS DUE, 1 by mouth daily to control cholesterol   AZELASTINE (OPTIVAR) 0.05 % OPHTHALMIC SOLUTION    Place 1 drop into both eyes 2 (two) times daily.    BIMATOPROST (LUMIGAN) 0.01 % SOLN    Instill 1 drop into both eyes once daily in the evening   CHOLECALCIFEROL (VITAMIN D) 2000 UNITS TABLET    Take 2,000 Units by mouth daily.   DICLOFENAC SODIUM (VOLTAREN) 1 % GEL    Apply 2 g topically daily as needed (for knee pain).    DORZOLAMIDE (TRUSOPT) 2 % OPHTHALMIC SOLUTION    Place 1 drop into both eyes 3 (three) times daily.    ESOMEPRAZOLE (NEXIUM) 40 MG CAPSULE    Take 1 capsule (40 mg total) by mouth 2 (two) times daily before a meal.   GLUCOSAMINE-CHONDROITIN 500-400 MG TABLET  Take 2 tablets by mouth daily.    METHYLCELLULOSE (ARTIFICIAL TEARS) 1 % OPHTHALMIC SOLUTION    Place 1 drop into both eyes daily as needed (for dry eyes).    TRAMADOL (ULTRAM) 50 MG TABLET    Take 1-2 tablets (50-100 mg total) by mouth daily as needed.   VALSARTAN-HYDROCHLOROTHIAZIDE (DIOVAN-HCT) 320-25 MG PER TABLET    TAKE 1 TABLET EVERY DAY TO CONTROL BLOOD PRESSURE   VITAMIN E 400 UNIT CAPSULE    Take 400 Units by mouth daily.  Modified Medications   Modified Medication Previous Medication   ALPRAZOLAM (XANAX) 0.25 MG TABLET ALPRAZolam (XANAX) 0.25 MG tablet      Take 0.5 tablets (0.125 mg total) by mouth at bedtime as needed for anxiety.    Take 0.5 tablets (0.125 mg total) by mouth at bedtime as needed for anxiety.   ZOLPIDEM (AMBIEN) 10 MG  TABLET zolpidem (AMBIEN) 10 MG tablet      Take 0.5 tablets (5 mg total) by mouth at bedtime as needed for sleep.    Take 0.5 tablets (5 mg total) by mouth at bedtime as needed for sleep.  Discontinued Medications   No medications on file     Review of Systems  Constitutional: Positive for activity change. Negative for fever, chills, diaphoresis, appetite change, fatigue and unexpected weight change.  HENT: Negative.   Eyes: Positive for discharge.       Corrective lenses.  Respiratory: Negative.   Cardiovascular: Positive for leg swelling. Negative for chest pain and palpitations.  Gastrointestinal:       Frequent reflux and heartburn.  Endocrine:       History of elevations in glucose. Diet controlled.  Genitourinary: Positive for frequency.       Urinary leakage,  Musculoskeletal:       Chronic back pains. Right knee painful. Had surgery 07/04/13 by Dr. Ninfa Linden. Using tramadol. Having some left neck discomfort.  Skin: Negative.   Neurological: Positive for numbness. Negative for dizziness, tremors, seizures, syncope, facial asymmetry, speech difficulty, weakness, light-headedness and headaches.       Episodes of numbness in the right hand.  Hematological: Negative.   Psychiatric/Behavioral: Negative.        Some difficulty falling asleep. Using less Ambien now.    Filed Vitals:   09/29/14 1630  BP: 126/88  Pulse: 59  Temp: 97.5 F (36.4 C)  TempSrc: Oral  Resp: 10  Height: 5\' 2"  (1.575 m)  Weight: 188 lb (85.276 kg)  SpO2: 98%   Body mass index is 34.38 kg/(m^2).  Physical Exam  Constitutional: She is oriented to person, place, and time. No distress.  Obese.  HENT:  Head: Normocephalic and atraumatic.  Right Ear: External ear normal.  Left Ear: External ear normal.  Eyes: Conjunctivae and EOM are normal. Pupils are equal, round, and reactive to light. Left eye exhibits no discharge. No scleral icterus.  Neck: Normal range of motion. Neck supple. No JVD present.  No tracheal deviation present. No thyromegaly present.  Cardiovascular: Normal rate, regular rhythm and intact distal pulses.  Exam reveals no gallop and no friction rub.   No murmur heard. Pulmonary/Chest: Breath sounds normal. No respiratory distress. She has no wheezes. She has no rales. She exhibits no tenderness.  Abdominal: Soft. Bowel sounds are normal. She exhibits no distension and no mass. There is no tenderness.  Musculoskeletal: She exhibits tenderness. She exhibits no edema.  Right knee S/P TKR. Able to flex about 110 degrees. Using a cane. Mid  tenderness left neck musculature.  Lymphadenopathy:    She has no cervical adenopathy.  Neurological: She is alert and oriented to person, place, and time. No cranial nerve deficit. Coordination normal.  Skin: No rash noted. No erythema. No pallor.  Psychiatric: She has a normal mood and affect. Her behavior is normal. Judgment and thought content normal.     Labs reviewed: Office Visit on 09/29/2014  Component Date Value Ref Range Status  . WBC 09/29/2014 6.8  3.4 - 10.8 x10E3/uL Final  . RBC 09/29/2014 4.15  3.77 - 5.28 x10E6/uL Final  . Hemoglobin 09/29/2014 12.3  11.1 - 15.9 g/dL Final  . HCT 09/29/2014 37.5  34.0 - 46.6 % Final  . MCV 09/29/2014 90  79 - 97 fL Final  . MCH 09/29/2014 29.6  26.6 - 33.0 pg Final  . MCHC 09/29/2014 32.8  31.5 - 35.7 g/dL Final  . RDW 09/29/2014 13.7  12.3 - 15.4 % Final  . Platelets 09/29/2014 172  150 - 379 x10E3/uL Final  . Neutrophils Relative % 09/29/2014 53   Final  . Lymphs 09/29/2014 36   Final  . Monocytes 09/29/2014 8   Final  . Eos 09/29/2014 3   Final  . Basos 09/29/2014 0   Final  . Neutrophils Absolute 09/29/2014 3.6  1.4 - 7.0 x10E3/uL Final  . Lymphocytes Absolute 09/29/2014 2.4  0.7 - 3.1 x10E3/uL Final  . Monocytes Absolute 09/29/2014 0.5  0.1 - 0.9 x10E3/uL Final  . Eosinophils Absolute 09/29/2014 0.2  0.0 - 0.4 x10E3/uL Final  . Basophils Absolute 09/29/2014 0.0  0.0 - 0.2  x10E3/uL Final  . Immature Granulocytes 09/29/2014 0   Final  . Immature Grans (Abs) 09/29/2014 0.0  0.0 - 0.1 x10E3/uL Final  Appointment on 09/25/2014  Component Date Value Ref Range Status  . Glucose 09/25/2014 101* 65 - 99 mg/dL Final  . BUN 09/25/2014 11  8 - 27 mg/dL Final  . Creatinine, Ser 09/25/2014 0.79  0.57 - 1.00 mg/dL Final  . GFR calc non Af Amer 09/25/2014 70  >59 mL/min/1.73 Final  . GFR calc Af Amer 09/25/2014 81  >59 mL/min/1.73 Final  . BUN/Creatinine Ratio 09/25/2014 14  11 - 26 Final  . Sodium 09/25/2014 141  134 - 144 mmol/L Final  . Potassium 09/25/2014 3.6  3.5 - 5.2 mmol/L Final  . Chloride 09/25/2014 101  97 - 108 mmol/L Final  . CO2 09/25/2014 27  18 - 29 mmol/L Final  . Calcium 09/25/2014 9.2  8.7 - 10.3 mg/dL Final  . Total Protein 09/25/2014 6.5  6.0 - 8.5 g/dL Final  . Albumin 09/25/2014 4.1  3.5 - 4.7 g/dL Final  . Globulin, Total 09/25/2014 2.4  1.5 - 4.5 g/dL Final  . Albumin/Globulin Ratio 09/25/2014 1.7  1.1 - 2.5 Final  . Total Bilirubin 09/25/2014 0.6  0.0 - 1.2 mg/dL Final  . Alkaline Phosphatase 09/25/2014 49  39 - 117 IU/L Final  . AST 09/25/2014 20  0 - 40 IU/L Final  . ALT 09/25/2014 16  0 - 32 IU/L Final  . Cholesterol, Total 09/25/2014 135  100 - 199 mg/dL Final  . Triglycerides 09/25/2014 142  0 - 149 mg/dL Final  . HDL 09/25/2014 41  >39 mg/dL Final   Comment: According to ATP-III Guidelines, HDL-C >59 mg/dL is considered a                          negative risk factor  for CHD.  Marland Kitchen VLDL Cholesterol Cal 09/25/2014 28  5 - 40 mg/dL Final  . LDL Calculated 09/25/2014 66  0 - 99 mg/dL Final  . Chol/HDL Ratio 09/25/2014 3.3  0.0 - 4.4 ratio units Final   Comment:                                   T. Chol/HDL Ratio                                                                      Men  Women                                                        1/2 Avg.Risk  3.4    3.3                                                            Avg.Risk   5.0    4.4                                                         2X Avg.Risk  9.6    7.1                                                         3X Avg.Risk 23.4   11.0  Office Visit on 09/22/2014  Component Date Value Ref Range Status  . H Pylori IgG 09/22/2014 <0.9  0.0 - 0.8 U/mL Final   Comment:                              Negative            <0.9                                                       Indeterminate  0.9 - 1.0                                                       Positive            >  1.0     Assessment/Plan  1. Acute pharyngitis, unspecified pharyngitis type - doxycycline (VIBRA-TABS) 100 MG tablet; One twice daily for infection  Dispense: 20 tablet; Refill: 0 - predniSONE (STERAPRED UNI-PAK) 5 MG TABS tablet; 6 tablets day 1, 5 on day 2, 4 on day 3, 3 on day 4, 2 on day 5, and 1 on day 6.  Dispense: 21 tablet; Refill: 0 - CBC With differential/Platelet  2. Anxiety state - ALPRAZolam (XANAX) 0.25 MG tablet; Take 0.5 tablets (0.125 mg total) by mouth at bedtime as needed for anxiety.  Dispense: 30 tablet; Refill: 3  3. Insomnia - zolpidem (AMBIEN) 10 MG tablet; Take 0.5 tablets (5 mg total) by mouth at bedtime as needed for sleep.  Dispense: 30 tablet; Refill: 3  4. ESOPHAGEAL STRICTURE Unchanged  5. Essential hypertension Controlled  6. Gastroesophageal reflux disease with esophagitis Controlled  7. Cervicalgia Controlled  8. Edema Unchanged  9. Arthritis of knee, right Unchanged  10. Primary osteoarthritis of right knee Unchanged  11. Dysphagia, pharyngoesophageal phase Improved

## 2014-10-07 ENCOUNTER — Other Ambulatory Visit: Payer: Self-pay | Admitting: Internal Medicine

## 2014-10-14 ENCOUNTER — Encounter: Payer: Self-pay | Admitting: Internal Medicine

## 2014-10-14 ENCOUNTER — Ambulatory Visit (INDEPENDENT_AMBULATORY_CARE_PROVIDER_SITE_OTHER): Payer: Medicare Other | Admitting: Internal Medicine

## 2014-10-14 VITALS — BP 132/78 | HR 53 | Temp 98.1°F | Ht 62.0 in | Wt 188.8 lb

## 2014-10-14 DIAGNOSIS — R609 Edema, unspecified: Secondary | ICD-10-CM

## 2014-10-14 DIAGNOSIS — J029 Acute pharyngitis, unspecified: Secondary | ICD-10-CM

## 2014-10-14 DIAGNOSIS — K222 Esophageal obstruction: Secondary | ICD-10-CM

## 2014-10-14 DIAGNOSIS — E7800 Pure hypercholesterolemia, unspecified: Secondary | ICD-10-CM

## 2014-10-14 DIAGNOSIS — R1314 Dysphagia, pharyngoesophageal phase: Secondary | ICD-10-CM

## 2014-10-14 DIAGNOSIS — I10 Essential (primary) hypertension: Secondary | ICD-10-CM

## 2014-10-14 NOTE — Progress Notes (Signed)
Patient ID: [name], female   DOB: [birth year], 80 y.o.   MRN: [id number]    Facility  [facility]    Place of Service:   OFFICE   Allergies  Allergen Reactions  . Advil [Ibuprofen]     GI upset  . Aleve [Naproxen Sodium]     GI upset  . Aspirin Nausea And Vomiting    Takes baby aspirin qod without problems  . Celebrex [Celecoxib]     GI upset  . Diclofenac     GI upset  . Metoclopramide Hcl     REACTION: heart palps  . Nsaids Other (See Comments)    Tears my stomach up   . Other     Antihistamine - increase BP  . Timolol     Dropped HR    Chief Complaint  Patient presents with  . Follow-up    2 week follow up  . Dizziness    Acute concern of dizziness    HPI:  Dysphagia, pharyngoesophageal phase: mild and improved since last visit  Acute pharyngitis, unspecified pharyngitis type: improved. No pain.  Essential hypertension: controlled  Edema: improved    Medications: Patient's Medications  New Prescriptions   No medications on file  Previous Medications   ALBUTEROL (PROVENTIL HFA;VENTOLIN HFA) 108 (90 BASE) MCG/ACT INHALER    Inhale 1 puff into the lungs every 6 (six) hours as needed for wheezing or shortness of breath.   ALPRAZOLAM (XANAX) 0.25 MG TABLET    Take 0.5 tablets (0.125 mg total) by mouth at bedtime as needed for anxiety.   ATORVASTATIN (LIPITOR) 10 MG TABLET    LABS DUE, 1 by mouth daily to control cholesterol   AZELASTINE (OPTIVAR) 0.05 % OPHTHALMIC SOLUTION    Place 1 drop into both eyes 2 (two) times daily.    BIMATOPROST (LUMIGAN) 0.01 % SOLN    Instill 1 drop into both eyes once daily in the evening   CHOLECALCIFEROL (VITAMIN D) 2000 UNITS TABLET    Take 2,000 Units by mouth daily.   DICLOFENAC SODIUM (VOLTAREN) 1 % GEL    Apply 2 g topically daily as needed (for knee pain).    DORZOLAMIDE (TRUSOPT) 2 % OPHTHALMIC SOLUTION    Place 1 drop into both eyes 3 (three) times daily.    ESOMEPRAZOLE (NEXIUM) 40 MG CAPSULE    Take 1 capsule (40 mg  total) by mouth 2 (two) times daily before a meal.   GLUCOSAMINE-CHONDROITIN 500-400 MG TABLET    Take 2 tablets by mouth daily.    METHYLCELLULOSE (ARTIFICIAL TEARS) 1 % OPHTHALMIC SOLUTION    Place 1 drop into both eyes daily as needed (for dry eyes).    TRAMADOL (ULTRAM) 50 MG TABLET    Take 1-2 tablets (50-100 mg total) by mouth daily as needed.   VALSARTAN-HYDROCHLOROTHIAZIDE (DIOVAN-HCT) 320-25 MG PER TABLET    TAKE 1 TABLET BY MOUTH EVERY DAY TO CONTROL BLOOD PRESSURE   VITAMIN E 400 UNIT CAPSULE    Take 400 Units by mouth daily.   ZOLPIDEM (AMBIEN) 10 MG TABLET    Take 0.5 tablets (5 mg total) by mouth at bedtime as needed for sleep.  Modified Medications   No medications on file  Discontinued Medications   DOXYCYCLINE (VIBRA-TABS) 100 MG TABLET    One twice daily for infection   PREDNISONE (STERAPRED UNI-PAK) 5 MG TABS TABLET    6 tablets day 1, 5 on day 2, 4 on day 3, 3 on day 4, 2 on  day 5, and 1 on day 6.     Review of Systems  Constitutional: Positive for activity change. Negative for fever, chills, diaphoresis, appetite change, fatigue and unexpected weight change.  HENT: Negative.   Eyes: Positive for discharge.       Corrective lenses.  Respiratory: Negative.   Cardiovascular: Positive for leg swelling. Negative for chest pain and palpitations.  Gastrointestinal:       Frequent reflux and heartburn.  Endocrine:       History of elevations in glucose. Diet controlled.  Genitourinary: Positive for frequency.       Urinary leakage,  Musculoskeletal:       Chronic back pains. Right knee painful. Had surgery 07/04/13 by Dr. Ninfa Linden. Using tramadol. Having some left neck discomfort.  Skin: Negative.   Neurological: Positive for numbness. Negative for dizziness, tremors, seizures, syncope, facial asymmetry, speech difficulty, weakness, light-headedness and headaches.       Episodes of numbness in the right hand.  Hematological: Negative.   Psychiatric/Behavioral: Negative.         Some difficulty falling asleep. Using less Ambien now.    Filed Vitals:   10/14/14 1117  BP: 132/78  Pulse: 53  Temp: 98.1 F (36.7 C)  TempSrc: Oral  Height: 5\' 2"  (1.575 m)  Weight: 188 lb 12.8 oz (85.639 kg)  SpO2: 97%   Body mass index is 34.52 kg/(m^2).  Physical Exam  Constitutional: She is oriented to person, place, and time. No distress.  Obese.  HENT:  Head: Normocephalic and atraumatic.  Right Ear: External ear normal.  Left Ear: External ear normal.  Eyes: Conjunctivae and EOM are normal. Pupils are equal, round, and reactive to light. Left eye exhibits no discharge. No scleral icterus.  Neck: Normal range of motion. Neck supple. No JVD present. No tracheal deviation present. No thyromegaly present.  Cardiovascular: Normal rate, regular rhythm and intact distal pulses.  Exam reveals no gallop and no friction rub.   No murmur heard. Pulmonary/Chest: Breath sounds normal. No respiratory distress. She has no wheezes. She has no rales. She exhibits no tenderness.  Abdominal: Soft. Bowel sounds are normal. She exhibits no distension and no mass. There is no tenderness.  Musculoskeletal: She exhibits tenderness. She exhibits no edema.  Right knee S/P TKR. Able to flex about 110 degrees. Using a cane. Mid tenderness left neck musculature.  Lymphadenopathy:    She has no cervical adenopathy.  Neurological: She is alert and oriented to person, place, and time. No cranial nerve deficit. Coordination normal.  Skin: No rash noted. No erythema. No pallor.  Psychiatric: She has a normal mood and affect. Her behavior is normal. Judgment and thought content normal.     Labs reviewed: Office Visit on 09/29/2014  Component Date Value Ref Range Status  . WBC 09/29/2014 6.8  3.4 - 10.8 x10E3/uL Final  . RBC 09/29/2014 4.15  3.77 - 5.28 x10E6/uL Final  . Hemoglobin 09/29/2014 12.3  11.1 - 15.9 g/dL Final  . HCT 09/29/2014 37.5  34.0 - 46.6 % Final  . MCV 09/29/2014 90  79 -  97 fL Final  . MCH 09/29/2014 29.6  26.6 - 33.0 pg Final  . MCHC 09/29/2014 32.8  31.5 - 35.7 g/dL Final  . RDW 09/29/2014 13.7  12.3 - 15.4 % Final  . Platelets 09/29/2014 172  150 - 379 x10E3/uL Final  . Neutrophils Relative % 09/29/2014 53   Final  . Lymphs 09/29/2014 36   Final  . Monocytes 09/29/2014  8   Final  . Eos 09/29/2014 3   Final  . Basos 09/29/2014 0   Final  . Neutrophils Absolute 09/29/2014 3.6  1.4 - 7.0 x10E3/uL Final  . Lymphocytes Absolute 09/29/2014 2.4  0.7 - 3.1 x10E3/uL Final  . Monocytes Absolute 09/29/2014 0.5  0.1 - 0.9 x10E3/uL Final  . Eosinophils Absolute 09/29/2014 0.2  0.0 - 0.4 x10E3/uL Final  . Basophils Absolute 09/29/2014 0.0  0.0 - 0.2 x10E3/uL Final  . Immature Granulocytes 09/29/2014 0   Final  . Immature Grans (Abs) 09/29/2014 0.0  0.0 - 0.1 x10E3/uL Final  Appointment on 09/25/2014  Component Date Value Ref Range Status  . Glucose 09/25/2014 101* 65 - 99 mg/dL Final  . BUN 09/25/2014 11  8 - 27 mg/dL Final  . Creatinine, Ser 09/25/2014 0.79  0.57 - 1.00 mg/dL Final  . GFR calc non Af Amer 09/25/2014 70  >59 mL/min/1.73 Final  . GFR calc Af Amer 09/25/2014 81  >59 mL/min/1.73 Final  . BUN/Creatinine Ratio 09/25/2014 14  11 - 26 Final  . Sodium 09/25/2014 141  134 - 144 mmol/L Final  . Potassium 09/25/2014 3.6  3.5 - 5.2 mmol/L Final  . Chloride 09/25/2014 101  97 - 108 mmol/L Final  . CO2 09/25/2014 27  18 - 29 mmol/L Final  . Calcium 09/25/2014 9.2  8.7 - 10.3 mg/dL Final  . Total Protein 09/25/2014 6.5  6.0 - 8.5 g/dL Final  . Albumin 09/25/2014 4.1  3.5 - 4.7 g/dL Final  . Globulin, Total 09/25/2014 2.4  1.5 - 4.5 g/dL Final  . Albumin/Globulin Ratio 09/25/2014 1.7  1.1 - 2.5 Final  . Total Bilirubin 09/25/2014 0.6  0.0 - 1.2 mg/dL Final  . Alkaline Phosphatase 09/25/2014 49  39 - 117 IU/L Final  . AST 09/25/2014 20  0 - 40 IU/L Final  . ALT 09/25/2014 16  0 - 32 IU/L Final  . Cholesterol, Total 09/25/2014 135  100 - 199 mg/dL Final  .  Triglycerides 09/25/2014 142  0 - 149 mg/dL Final  . HDL 09/25/2014 41  >39 mg/dL Final   Comment: According to ATP-III Guidelines, HDL-C >59 mg/dL is considered a                          negative risk factor for CHD.  Marland Kitchen VLDL Cholesterol Cal 09/25/2014 28  5 - 40 mg/dL Final  . LDL Calculated 09/25/2014 66  0 - 99 mg/dL Final  . Chol/HDL Ratio 09/25/2014 3.3  0.0 - 4.4 ratio units Final   Comment:                                   T. Chol/HDL Ratio                                                                      Men  Women  1/2 Avg.Risk  3.4    3.3                                                            Avg.Risk  5.0    4.4                                                         2X Avg.Risk  9.6    7.1                                                         3X Avg.Risk 23.4   11.0  Office Visit on 09/22/2014  Component Date Value Ref Range Status  . H Pylori IgG 09/22/2014 <0.9  0.0 - 0.8 U/mL Final   Comment:                              Negative            <0.9                                                       Indeterminate  0.9 - 1.0                                                       Positive            >1.0     Assessment/Plan  1. Dysphagia, pharyngoesophageal phase Continue Nexium  2. Acute pharyngitis, unspecified pharyngitis type improved  3. Essential hypertension controlled  4. Edema improved

## 2014-10-17 ENCOUNTER — Other Ambulatory Visit: Payer: Self-pay | Admitting: Internal Medicine

## 2014-10-24 ENCOUNTER — Other Ambulatory Visit: Payer: Self-pay | Admitting: Internal Medicine

## 2014-10-27 ENCOUNTER — Other Ambulatory Visit: Payer: Self-pay | Admitting: *Deleted

## 2014-12-26 ENCOUNTER — Other Ambulatory Visit: Payer: Self-pay | Admitting: Internal Medicine

## 2015-01-05 DIAGNOSIS — N762 Acute vulvitis: Secondary | ICD-10-CM | POA: Diagnosis not present

## 2015-01-05 DIAGNOSIS — N898 Other specified noninflammatory disorders of vagina: Secondary | ICD-10-CM | POA: Diagnosis not present

## 2015-01-30 ENCOUNTER — Other Ambulatory Visit: Payer: Self-pay | Admitting: Internal Medicine

## 2015-02-15 ENCOUNTER — Other Ambulatory Visit: Payer: Medicare Other

## 2015-02-15 DIAGNOSIS — E7800 Pure hypercholesterolemia, unspecified: Secondary | ICD-10-CM

## 2015-02-15 DIAGNOSIS — I10 Essential (primary) hypertension: Secondary | ICD-10-CM

## 2015-02-16 LAB — BASIC METABOLIC PANEL
BUN/Creatinine Ratio: 21 (ref 11–26)
BUN: 15 mg/dL (ref 8–27)
CO2: 26 mmol/L (ref 18–29)
CREATININE: 0.7 mg/dL (ref 0.57–1.00)
Calcium: 9.2 mg/dL (ref 8.7–10.3)
Chloride: 103 mmol/L (ref 97–108)
GFR calc Af Amer: 94 mL/min/{1.73_m2} (ref >59)
GFR calc non Af Amer: 82 mL/min/{1.73_m2} (ref >59)
GLUCOSE: 99 mg/dL (ref 65–99)
Potassium: 3.6 mmol/L (ref 3.5–5.2)
Sodium: 142 mmol/L (ref 134–144)

## 2015-02-16 LAB — LIPID PANEL
CHOLESTEROL TOTAL: 186 mg/dL (ref 100–199)
Chol/HDL Ratio: 4.3 ratio units (ref 0.0–4.4)
HDL: 43 mg/dL (ref >39)
LDL Calculated: 91 mg/dL (ref 0–99)
TRIGLYCERIDES: 258 mg/dL — AB (ref 0–149)
VLDL Cholesterol Cal: 52 mg/dL — ABNORMAL HIGH (ref 5–40)

## 2015-02-17 ENCOUNTER — Encounter: Payer: Self-pay | Admitting: Internal Medicine

## 2015-02-17 ENCOUNTER — Ambulatory Visit (INDEPENDENT_AMBULATORY_CARE_PROVIDER_SITE_OTHER): Payer: Medicare Other | Admitting: Internal Medicine

## 2015-02-17 VITALS — BP 138/86 | HR 51 | Temp 97.6°F | Ht 62.0 in | Wt 191.4 lb

## 2015-02-17 DIAGNOSIS — E669 Obesity, unspecified: Secondary | ICD-10-CM

## 2015-02-17 DIAGNOSIS — R609 Edema, unspecified: Secondary | ICD-10-CM

## 2015-02-17 DIAGNOSIS — I10 Essential (primary) hypertension: Secondary | ICD-10-CM

## 2015-02-17 DIAGNOSIS — G4700 Insomnia, unspecified: Secondary | ICD-10-CM | POA: Diagnosis not present

## 2015-02-17 DIAGNOSIS — M76892 Other specified enthesopathies of left lower limb, excluding foot: Secondary | ICD-10-CM | POA: Diagnosis not present

## 2015-02-17 DIAGNOSIS — Z23 Encounter for immunization: Secondary | ICD-10-CM | POA: Diagnosis not present

## 2015-02-17 DIAGNOSIS — R059 Cough, unspecified: Secondary | ICD-10-CM | POA: Insufficient documentation

## 2015-02-17 DIAGNOSIS — E7800 Pure hypercholesterolemia, unspecified: Secondary | ICD-10-CM

## 2015-02-17 DIAGNOSIS — R1314 Dysphagia, pharyngoesophageal phase: Secondary | ICD-10-CM

## 2015-02-17 DIAGNOSIS — R05 Cough: Secondary | ICD-10-CM

## 2015-02-17 DIAGNOSIS — R739 Hyperglycemia, unspecified: Secondary | ICD-10-CM

## 2015-02-17 NOTE — Progress Notes (Signed)
Patient ID: [name], female   DOB: 06/18/1931, 81 y.o.   MRN: [id number]    Facility  [facility]    Place of Service:   OFFICE   Allergies  Allergen Reactions  . Advil [Ibuprofen]     GI upset  . Aleve [Naproxen Sodium]     GI upset  . Aspirin Nausea And Vomiting    Takes baby aspirin qod without problems  . Celebrex [Celecoxib]     GI upset  . Diclofenac     GI upset  . Doxycycline     Headache  . Metoclopramide Hcl     REACTION: heart palps  . Nsaids Other (See Comments)    Tears my stomach up   . Other     Antihistamine - increase BP  . Timolol     Dropped HR    Chief Complaint  Patient presents with  . Medical Management of Chronic Issues    4 Month follow up    HPI:  Dry cough. Hoarseness.    Essential hypertension: controlled  HYPERCHOLESTEROLEMIA: conrtrolled  Insomnia: Stays tired due to insomnia. Trouble staying asleep. Retires 2:30 AM. Ambien helps.  Enthesopathy of hip region, left: Bursitis in left hip. Dr. Ninfa Linden gave her injection in the left hip. Tylenol seems to help. Using diclofenac gel as well.  Edema: mild bipedal.  Dysphagia, pharyngoesophageal phase: occasional episodes of food sticking in substernal area.  Hyperglycemia: mild and most recent lab is normal  Obese: gaining weight. Says she is going to lose weight, but has no real plan to do so.    Medications: Patient's Medications  New Prescriptions   No medications on file  Previous Medications   ALPRAZOLAM (XANAX) 0.25 MG TABLET    Take 0.5 tablets (0.125 mg total) by mouth at bedtime as needed for anxiety.   ATORVASTATIN (LIPITOR) 10 MG TABLET    TAKE 1 TABLET BY MOUTH DAILY TO CONTROL CHOLESTEROL **PATIENT DUE FOR LAB WORK**   AZELASTINE (OPTIVAR) 0.05 % OPHTHALMIC SOLUTION    Place 1 drop into both eyes 2 (two) times daily.    BIMATOPROST (LUMIGAN) 0.01 % SOLN    Instill 1 drop into both eyes once daily in the evening   CHOLECALCIFEROL (VITAMIN D) 2000 UNITS TABLET     Take 2,000 Units by mouth daily.   DICLOFENAC SODIUM (VOLTAREN) 1 % GEL    Apply 2 g topically daily as needed (for knee pain).    DORZOLAMIDE (TRUSOPT) 2 % OPHTHALMIC SOLUTION    Place 1 drop into both eyes 3 (three) times daily.    ESOMEPRAZOLE (NEXIUM) 40 MG CAPSULE    Take 1 capsule (40 mg total) by mouth 2 (two) times daily before a meal.   GLUCOSAMINE-CHONDROITIN 500-400 MG TABLET    Take 2 tablets by mouth daily.    METHYLCELLULOSE (ARTIFICIAL TEARS) 1 % OPHTHALMIC SOLUTION    Place 1 drop into both eyes daily as needed (for dry eyes).    VALSARTAN-HYDROCHLOROTHIAZIDE (DIOVAN-HCT) 320-25 MG PER TABLET    TAKE 1 TABLET BY MOUTH EVERY DAY TO CONTROL BLOOD PRESSURE   VITAMIN E 400 UNIT CAPSULE    Take 400 Units by mouth daily.   ZOLPIDEM (AMBIEN) 10 MG TABLET    Take 0.5 tablets (5 mg total) by mouth at bedtime as needed for sleep.  Modified Medications   No medications on file  Discontinued Medications   ALBUTEROL (PROVENTIL HFA;VENTOLIN HFA) 108 (90 BASE) MCG/ACT INHALER    Inhale 1 puff  into the lungs every 6 (six) hours as needed for wheezing or shortness of breath.   TRAMADOL (ULTRAM) 50 MG TABLET    Take 1-2 tablets (50-100 mg total) by mouth daily as needed.     Review of Systems  Constitutional: Positive for activity change. Negative for fever, chills, diaphoresis, appetite change, fatigue and unexpected weight change.  HENT: Negative.   Eyes: Positive for discharge.       Corrective lenses.  Respiratory: Negative.   Cardiovascular: Positive for leg swelling. Negative for chest pain and palpitations.  Gastrointestinal:       Frequent reflux and heartburn.  Endocrine:       History of elevations in glucose. Diet controlled.  Genitourinary: Positive for frequency.       Urinary leakage,  Musculoskeletal:       Chronic back pains. Right knee painful. Had surgery 07/04/13 by Dr. Ninfa Linden. Using tramadol. Having some left neck discomfort.  Skin: Negative.   Neurological:  Positive for numbness. Negative for dizziness, tremors, seizures, syncope, facial asymmetry, speech difficulty, weakness, light-headedness and headaches.       Episodes of numbness in the right hand.  Hematological: Negative.   Psychiatric/Behavioral: Negative.        Some difficulty falling asleep. Using less Ambien now.    Filed Vitals:   02/17/15 1604  BP: 138/86  Pulse: 51  Temp: 97.6 F (36.4 C)  TempSrc: Oral  Height: 5\' 2"  (1.575 m)  Weight: 191 lb 6.4 oz (86.818 kg)   Body mass index is 35 kg/(m^2).  Physical Exam  Constitutional: She is oriented to person, place, and time. No distress.  Obese.  HENT:  Head: Normocephalic and atraumatic.  Right Ear: External ear normal.  Left Ear: External ear normal.  Eyes: Conjunctivae and EOM are normal. Pupils are equal, round, and reactive to light. Left eye exhibits no discharge. No scleral icterus.  Neck: Normal range of motion. Neck supple. No JVD present. No tracheal deviation present. No thyromegaly present.  Cardiovascular: Normal rate, regular rhythm and intact distal pulses.  Exam reveals no gallop and no friction rub.   No murmur heard. Pulmonary/Chest: Breath sounds normal. No respiratory distress. She has no wheezes. She has no rales. She exhibits no tenderness.  Abdominal: Soft. Bowel sounds are normal. She exhibits no distension and no mass. There is no tenderness.  Musculoskeletal: She exhibits tenderness. She exhibits no edema.  Right knee S/P TKR. Able to flex about 110 degrees. Using a cane. Mid tenderness left neck musculature.  Lymphadenopathy:    She has no cervical adenopathy.  Neurological: She is alert and oriented to person, place, and time. No cranial nerve deficit. Coordination normal.  Skin: No rash noted. No erythema. No pallor.  Psychiatric: She has a normal mood and affect. Her behavior is normal. Judgment and thought content normal.     Labs reviewed: Appointment on 02/15/2015  Component Date  Value Ref Range Status  . Cholesterol, Total 02/15/2015 186  100 - 199 mg/dL Final  . Triglycerides 02/15/2015 258* 0 - 149 mg/dL Final  . HDL 02/15/2015 43  >39 mg/dL Final   Comment: According to ATP-III Guidelines, HDL-C >59 mg/dL is considered a negative risk factor for CHD.   Marland Kitchen VLDL Cholesterol Cal 02/15/2015 52* 5 - 40 mg/dL Final  . LDL Calculated 02/15/2015 91  0 - 99 mg/dL Final  . Chol/HDL Ratio 02/15/2015 4.3  0.0 - 4.4 ratio units Final   Comment:  T. Chol/HDL Ratio                                             Men  Women                               1/2 Avg.Risk  3.4    3.3                                   Avg.Risk  5.0    4.4                                2X Avg.Risk  9.6    7.1                                3X Avg.Risk 23.4   11.0   . Glucose 02/15/2015 99  65 - 99 mg/dL Final  . BUN 02/15/2015 15  8 - 27 mg/dL Final  . Creatinine, Ser 02/15/2015 0.70  0.57 - 1.00 mg/dL Final  . GFR calc non Af Amer 02/15/2015 82  >59 mL/min/1.73 Final  . GFR calc Af Amer 02/15/2015 94  >59 mL/min/1.73 Final  . BUN/Creatinine Ratio 02/15/2015 21  11 - 26 Final  . Sodium 02/15/2015 142  134 - 144 mmol/L Final  . Potassium 02/15/2015 3.6  3.5 - 5.2 mmol/L Final  . Chloride 02/15/2015 103  97 - 108 mmol/L Final  . CO2 02/15/2015 26  18 - 29 mmol/L Final  . Calcium 02/15/2015 9.2  8.7 - 10.3 mg/dL Final     Assessment/Plan 1. Essential hypertension - Comprehensive metabolic panel; Future  2. HYPERCHOLESTEROLEMIA - Lipid panel; Future  3. Insomnia Continue current medicatiojn  4. Enthesopathy of hip region, left Continue with ortho  5. Edema miniomal  6. Dysphagia, pharyngoesophageal phase improved  7. Hyperglycemia - Hemoglobin A1c; Future  8. Obese Lengthy discussion about 1`000 calorie diet - TSH; Future  9. Need for vaccination with 13-polyvalent pneumococcal conjugate vaccine - Pneumococcal conjugate vaccine 13-valent  10.  Cough Etiology uncertain but thought related to reflux

## 2015-02-24 ENCOUNTER — Other Ambulatory Visit: Payer: Self-pay | Admitting: Nurse Practitioner

## 2015-03-22 DIAGNOSIS — Z1231 Encounter for screening mammogram for malignant neoplasm of breast: Secondary | ICD-10-CM | POA: Diagnosis not present

## 2015-03-22 LAB — HM MAMMOGRAPHY: HM Mammogram: NEGATIVE

## 2015-03-23 ENCOUNTER — Other Ambulatory Visit (HOSPITAL_COMMUNITY): Payer: Self-pay | Admitting: Orthopaedic Surgery

## 2015-03-23 ENCOUNTER — Ambulatory Visit (HOSPITAL_COMMUNITY)
Admission: RE | Admit: 2015-03-23 | Discharge: 2015-03-23 | Disposition: A | Discharge status: Home / Self Care | Payer: Medicare Other | Source: Ambulatory Visit | Attending: Orthopaedic Surgery | Admitting: Orthopaedic Surgery

## 2015-03-23 DIAGNOSIS — M25561 Pain in right knee: Secondary | ICD-10-CM | POA: Diagnosis not present

## 2015-03-23 DIAGNOSIS — M7989 Other specified soft tissue disorders: Secondary | ICD-10-CM

## 2015-03-23 DIAGNOSIS — M79604 Pain in right leg: Secondary | ICD-10-CM | POA: Diagnosis not present

## 2015-03-23 NOTE — Progress Notes (Signed)
Right lower extremity venous duplex completed. No evidence for DVT, SVT, or Baker's cyst. °Brianna L Mazza,RVT °

## 2015-03-29 DIAGNOSIS — H04123 Dry eye syndrome of bilateral lacrimal glands: Secondary | ICD-10-CM | POA: Diagnosis not present

## 2015-03-29 DIAGNOSIS — H4011X2 Primary open-angle glaucoma, moderate stage: Secondary | ICD-10-CM | POA: Diagnosis not present

## 2015-03-30 ENCOUNTER — Other Ambulatory Visit: Payer: Self-pay | Admitting: Internal Medicine

## 2015-04-01 ENCOUNTER — Telehealth (HOSPITAL_COMMUNITY): Payer: Self-pay | Admitting: *Deleted

## 2015-04-01 DIAGNOSIS — M545 Low back pain: Secondary | ICD-10-CM | POA: Diagnosis not present

## 2015-04-04 ENCOUNTER — Other Ambulatory Visit: Payer: Self-pay | Admitting: Internal Medicine

## 2015-04-06 DIAGNOSIS — M5416 Radiculopathy, lumbar region: Secondary | ICD-10-CM | POA: Diagnosis not present

## 2015-04-06 DIAGNOSIS — M545 Low back pain: Secondary | ICD-10-CM | POA: Diagnosis not present

## 2015-04-09 DIAGNOSIS — M545 Low back pain: Secondary | ICD-10-CM | POA: Diagnosis not present

## 2015-04-09 DIAGNOSIS — M5416 Radiculopathy, lumbar region: Secondary | ICD-10-CM | POA: Diagnosis not present

## 2015-04-12 DIAGNOSIS — M545 Low back pain: Secondary | ICD-10-CM | POA: Diagnosis not present

## 2015-04-12 DIAGNOSIS — M5416 Radiculopathy, lumbar region: Secondary | ICD-10-CM | POA: Diagnosis not present

## 2015-04-14 DIAGNOSIS — M5416 Radiculopathy, lumbar region: Secondary | ICD-10-CM | POA: Diagnosis not present

## 2015-04-14 DIAGNOSIS — M545 Low back pain: Secondary | ICD-10-CM | POA: Diagnosis not present

## 2015-04-16 DIAGNOSIS — M545 Low back pain: Secondary | ICD-10-CM | POA: Diagnosis not present

## 2015-04-16 DIAGNOSIS — M5416 Radiculopathy, lumbar region: Secondary | ICD-10-CM | POA: Diagnosis not present

## 2015-04-19 DIAGNOSIS — M5416 Radiculopathy, lumbar region: Secondary | ICD-10-CM | POA: Diagnosis not present

## 2015-04-19 DIAGNOSIS — M545 Low back pain: Secondary | ICD-10-CM | POA: Diagnosis not present

## 2015-04-21 DIAGNOSIS — M545 Low back pain: Secondary | ICD-10-CM | POA: Diagnosis not present

## 2015-04-21 DIAGNOSIS — M5416 Radiculopathy, lumbar region: Secondary | ICD-10-CM | POA: Diagnosis not present

## 2015-04-23 DIAGNOSIS — M545 Low back pain: Secondary | ICD-10-CM | POA: Diagnosis not present

## 2015-04-23 DIAGNOSIS — M5416 Radiculopathy, lumbar region: Secondary | ICD-10-CM | POA: Diagnosis not present

## 2015-04-26 DIAGNOSIS — M79604 Pain in right leg: Secondary | ICD-10-CM | POA: Diagnosis not present

## 2015-04-26 DIAGNOSIS — M545 Low back pain: Secondary | ICD-10-CM | POA: Diagnosis not present

## 2015-04-26 DIAGNOSIS — M5416 Radiculopathy, lumbar region: Secondary | ICD-10-CM | POA: Diagnosis not present

## 2015-04-28 DIAGNOSIS — M545 Low back pain: Secondary | ICD-10-CM | POA: Diagnosis not present

## 2015-04-28 DIAGNOSIS — M5416 Radiculopathy, lumbar region: Secondary | ICD-10-CM | POA: Diagnosis not present

## 2015-04-30 DIAGNOSIS — M5416 Radiculopathy, lumbar region: Secondary | ICD-10-CM | POA: Diagnosis not present

## 2015-04-30 DIAGNOSIS — M545 Low back pain: Secondary | ICD-10-CM | POA: Diagnosis not present

## 2015-05-31 DIAGNOSIS — M545 Low back pain: Secondary | ICD-10-CM | POA: Diagnosis not present

## 2015-06-03 DIAGNOSIS — M4316 Spondylolisthesis, lumbar region: Secondary | ICD-10-CM | POA: Diagnosis not present

## 2015-06-03 DIAGNOSIS — M5126 Other intervertebral disc displacement, lumbar region: Secondary | ICD-10-CM | POA: Diagnosis not present

## 2015-06-03 DIAGNOSIS — M47816 Spondylosis without myelopathy or radiculopathy, lumbar region: Secondary | ICD-10-CM | POA: Diagnosis not present

## 2015-06-08 ENCOUNTER — Other Ambulatory Visit: Payer: Self-pay | Admitting: *Deleted

## 2015-06-08 MED ORDER — ZOLPIDEM TARTRATE 10 MG PO TABS: ORAL_TABLET | ORAL | Status: DC

## 2015-06-08 NOTE — Telephone Encounter (Signed)
[facility]  

## 2015-06-10 DIAGNOSIS — M545 Low back pain: Secondary | ICD-10-CM | POA: Diagnosis not present

## 2015-06-21 ENCOUNTER — Other Ambulatory Visit: Payer: Medicare Other

## 2015-06-23 ENCOUNTER — Ambulatory Visit: Payer: Medicare Other | Admitting: Internal Medicine

## 2015-07-12 DIAGNOSIS — N762 Acute vulvitis: Secondary | ICD-10-CM | POA: Diagnosis not present

## 2015-07-15 ENCOUNTER — Other Ambulatory Visit: Payer: Self-pay | Admitting: Nurse Practitioner

## 2015-07-15 ENCOUNTER — Other Ambulatory Visit: Payer: Self-pay | Admitting: Internal Medicine

## 2015-07-26 ENCOUNTER — Other Ambulatory Visit: Payer: Medicare Other

## 2015-07-26 DIAGNOSIS — I10 Essential (primary) hypertension: Secondary | ICD-10-CM

## 2015-07-26 DIAGNOSIS — E669 Obesity, unspecified: Secondary | ICD-10-CM | POA: Diagnosis not present

## 2015-07-26 DIAGNOSIS — E7800 Pure hypercholesterolemia, unspecified: Secondary | ICD-10-CM

## 2015-07-26 DIAGNOSIS — R739 Hyperglycemia, unspecified: Secondary | ICD-10-CM

## 2015-07-27 LAB — COMPREHENSIVE METABOLIC PANEL
ALBUMIN: 3.9 g/dL (ref 3.5–4.7)
ALT: 17 IU/L (ref 0–32)
AST: 21 IU/L (ref 0–40)
Albumin/Globulin Ratio: 1.6 (ref 1.1–2.5)
Alkaline Phosphatase: 55 IU/L (ref 39–117)
BILIRUBIN TOTAL: 0.3 mg/dL (ref 0.0–1.2)
BUN/Creatinine Ratio: 22 (ref 11–26)
BUN: 15 mg/dL (ref 8–27)
CO2: 25 mmol/L (ref 18–29)
Calcium: 9 mg/dL (ref 8.7–10.3)
Chloride: 104 mmol/L (ref 97–108)
Creatinine, Ser: 0.68 mg/dL (ref 0.57–1.00)
GFR calc non Af Amer: 82 mL/min/{1.73_m2} (ref >59)
GFR, EST AFRICAN AMERICAN: 94 mL/min/{1.73_m2} (ref >59)
GLOBULIN, TOTAL: 2.4 g/dL (ref 1.5–4.5)
Glucose: 92 mg/dL (ref 65–99)
POTASSIUM: 3.7 mmol/L (ref 3.5–5.2)
Sodium: 143 mmol/L (ref 134–144)
Total Protein: 6.3 g/dL (ref 6.0–8.5)

## 2015-07-27 LAB — LIPID PANEL
Chol/HDL Ratio: 3.6 ratio units (ref 0.0–4.4)
Cholesterol, Total: 151 mg/dL (ref 100–199)
HDL: 42 mg/dL (ref >39)
LDL CALC: 80 mg/dL (ref 0–99)
Triglycerides: 143 mg/dL (ref 0–149)
VLDL Cholesterol Cal: 29 mg/dL (ref 5–40)

## 2015-07-27 LAB — HEMOGLOBIN A1C
ESTIMATED AVERAGE GLUCOSE: 117 mg/dL
HEMOGLOBIN A1C: 5.7 % — AB (ref 4.8–5.6)

## 2015-07-27 LAB — TSH: TSH: 3.87 u[IU]/mL (ref 0.450–4.500)

## 2015-07-28 ENCOUNTER — Ambulatory Visit (INDEPENDENT_AMBULATORY_CARE_PROVIDER_SITE_OTHER): Payer: Medicare Other | Admitting: Internal Medicine

## 2015-07-28 ENCOUNTER — Encounter: Payer: Self-pay | Admitting: Internal Medicine

## 2015-07-28 VITALS — BP 122/74 | HR 46 | Temp 98.1°F | Ht 62.0 in | Wt 181.2 lb

## 2015-07-28 DIAGNOSIS — G4700 Insomnia, unspecified: Secondary | ICD-10-CM

## 2015-07-28 DIAGNOSIS — R001 Bradycardia, unspecified: Secondary | ICD-10-CM | POA: Diagnosis not present

## 2015-07-28 DIAGNOSIS — E669 Obesity, unspecified: Secondary | ICD-10-CM

## 2015-07-28 DIAGNOSIS — M5442 Lumbago with sciatica, left side: Secondary | ICD-10-CM

## 2015-07-28 DIAGNOSIS — E7800 Pure hypercholesterolemia, unspecified: Secondary | ICD-10-CM

## 2015-07-28 DIAGNOSIS — I10 Essential (primary) hypertension: Secondary | ICD-10-CM

## 2015-07-28 DIAGNOSIS — R739 Hyperglycemia, unspecified: Secondary | ICD-10-CM | POA: Diagnosis not present

## 2015-07-28 MED ORDER — ZOLPIDEM TARTRATE 10 MG PO TABS: ORAL_TABLET | ORAL | Status: DC

## 2015-07-28 NOTE — Progress Notes (Signed)
Patient ID: [name], female   DOB: 04-18-1931, 81 y.o.   MRN: [id number]    Facility  [facility]    Place of Service:   OFFICE    Allergies  Allergen Reactions  . Advil [Ibuprofen]     GI upset  . Aleve [Naproxen Sodium]     GI upset  . Aspirin Nausea And Vomiting    Takes baby aspirin qod without problems  . Celebrex [Celecoxib]     GI upset  . Diclofenac     GI upset  . Doxycycline     Headache  . Metoclopramide Hcl     REACTION: heart palps  . Nsaids Other (See Comments)    Tears my stomach up   . Other     Antihistamine - increase BP  . Timolol     Dropped HR    Chief Complaint  Patient presents with  . Medical Management of Chronic Issues    Medical Management of Chronic Issues. 4 Month follow up    HPI:  Bradycardia: Chronic condition for which she has seen Dr. Aundra Dubin in May 2014. Echocardiogram showed normal values with a left ventricular ejection fraction of 55-60%. She was to have had a Holter monitor as well following her visit with him in 2014. I do not see a copy of this in the current electronic medical record. There are no associated symptoms such as dizziness, palpitations, chest discomfort, or near blackouts. She has had some epigastric discomfort that she associates with acid indigestion. It does respond to use of Nexium or Prilosec.  Midline low back pain with left-sided sciatica: Present for over a year off and on. Has seen Dr. Ninfa Linden. She was scheduled for epidural injection because it was some associated sciatica down the left leg. She did not carry through on this because her husband required hospitalization for CABG. She will get back in touch with Dr. Ninfa Linden.  Essential hypertension: Controlled  Hyperglycemia: Controlled  Obese: Lost 10 pounds since last visit. Has been stressed by her husband's recent CABG.  Insomnia: Response to zolpidem  HYPERCHOLESTEROLEMIA: Controlled    Medications: Patient's Medications  New Prescriptions   No medications on [name]  Previous Medications   ALPRAZOLAM (XANAX) 0.25 MG TABLET    TAKE 1/2 TABLET AT BEDTIME AS NEEDED FOR ANXIETY.   ATORVASTATIN (LIPITOR) 10 MG TABLET    TAKE 1 TABLET BY MOUTH EVERY DAY   AZELASTINE (OPTIVAR) 0.05 % OPHTHALMIC SOLUTION    Place 1 drop into both eyes 2 (two) times daily.    BIMATOPROST (LUMIGAN) 0.01 % SOLN    Instill 1 drop into both eyes once daily in the evening   CHOLECALCIFEROL (VITAMIN D) 2000 UNITS TABLET    Take 2,000 Units by mouth daily.   DICLOFENAC SODIUM (VOLTAREN) 1 % GEL    Apply 2 g topically daily as needed (for knee pain).    DORZOLAMIDE (TRUSOPT) 2 % OPHTHALMIC SOLUTION    Place 1 drop into both eyes 3 (three) times daily.    GLUCOSAMINE-CHONDROITIN 500-400 MG TABLET    Take 2 tablets by mouth daily.    METHYLCELLULOSE (ARTIFICIAL TEARS) 1 % OPHTHALMIC SOLUTION    Place 1 drop into both eyes daily as needed (for dry eyes).    VALSARTAN-HYDROCHLOROTHIAZIDE (DIOVAN-HCT) 320-25 MG PER TABLET    TAKE 1 TABLET BY MOUTH EVERY DAY TO CONTROL BLOOD PRESSURE   VITAMIN E 400 UNIT CAPSULE    Take 400 Units by mouth daily.  Modified Medications  Modified Medication Previous Medication   ZOLPIDEM (AMBIEN) 10 MG TABLET zolpidem (AMBIEN) 10 MG tablet      Take 1/2 tablet by mouth at bedtime as needed for sleep    Take 1/2 tablet by mouth at bedtime as needed for sleep  Discontinued Medications   ESOMEPRAZOLE (NEXIUM) 40 MG CAPSULE    Take 1 capsule (40 mg total) by mouth 2 (two) times daily before a meal.   OMEPRAZOLE (PRILOSEC) 40 MG CAPSULE    TAKE ONE CAPSULE BY MOUTH EVERY DAY FOR ACID REDUCTION     Review of Systems  Constitutional: Positive for activity change. Negative for fever, chills, diaphoresis, appetite change, fatigue and unexpected weight change.  HENT: Negative.   Eyes: Positive for discharge.       Corrective lenses.  Respiratory: Negative.   Cardiovascular: Positive for leg swelling. Negative for chest pain and palpitations.    Gastrointestinal:       Frequent reflux and heartburn.  Endocrine:       History of elevations in glucose. Diet controlled.  Genitourinary: Positive for frequency.       Urinary leakage,  Musculoskeletal:       Chronic back pains. Right knee painful. Had surgery 07/04/13 by Dr. Ninfa Linden. Using tramadol. Having some left neck discomfort.  Skin: Negative.   Neurological: Positive for numbness. Negative for dizziness, tremors, seizures, syncope, facial asymmetry, speech difficulty, weakness, light-headedness and headaches.       Episodes of numbness in the right hand.  Hematological: Negative.   Psychiatric/Behavioral: Negative.        Some difficulty falling asleep. Using less Ambien now.    Filed Vitals:   07/28/15 1207  BP: 122/74  Pulse: 46  Temp: 98.1 F (36.7 C)  TempSrc: Oral  Height: 5\' 2"  (1.575 m)  Weight: 181 lb 3.2 oz (82.192 kg)   Body mass index is 33.13 kg/(m^2).  Physical Exam  Constitutional: She is oriented to person, place, and time. No distress.  Obese.  HENT:  Head: Normocephalic and atraumatic.  Right Ear: External ear normal.  Left Ear: External ear normal.  Eyes: Conjunctivae and EOM are normal. Pupils are equal, round, and reactive to light. Left eye exhibits no discharge. No scleral icterus.  Neck: Normal range of motion. Neck supple. No JVD present. No tracheal deviation present. No thyromegaly present.  Cardiovascular: Regular rhythm and intact distal pulses.  Exam reveals no gallop and no friction rub.   No murmur heard. Bradycardia with a rate of 48  Pulmonary/Chest: Breath sounds normal. No respiratory distress. She has no wheezes. She has no rales. She exhibits no tenderness.  Abdominal: Soft. Bowel sounds are normal. She exhibits no distension and no mass. There is no tenderness.  Mild epigastric discomfort with deep palpation. No abdominal bruit.  Musculoskeletal: She exhibits tenderness. She exhibits no edema.  Right knee S/P TKR.    Lymphadenopathy:    She has no cervical adenopathy.  Neurological: She is alert and oriented to person, place, and time. No cranial nerve deficit. Coordination normal.  Skin: No rash noted. No erythema. No pallor.  Psychiatric: She has a normal mood and affect. Her behavior is normal. Judgment and thought content normal.     Labs reviewed: Appointment on 07/26/2015  Component Date Value Ref Range Status  . Hgb A1c MFr Bld 07/26/2015 5.7* 4.8 - 5.6 % Final   Comment:          Pre-diabetes: 5.7 - 6.4  Diabetes: >6.4          Glycemic control for adults with diabetes: <7.0   . Est. average glucose Bld gHb Est-m* 07/26/2015 117   Final  . TSH 07/26/2015 3.870  0.450 - 4.500 uIU/mL Final  . Cholesterol, Total 07/26/2015 151  100 - 199 mg/dL Final  . Triglycerides 07/26/2015 143  0 - 149 mg/dL Final  . HDL 07/26/2015 42  >39 mg/dL Final   Comment: According to ATP-III Guidelines, HDL-C >59 mg/dL is considered a negative risk factor for CHD.   Marland Kitchen VLDL Cholesterol Cal 07/26/2015 29  5 - 40 mg/dL Final  . LDL Calculated 07/26/2015 80  0 - 99 mg/dL Final  . Chol/HDL Ratio 07/26/2015 3.6  0.0 - 4.4 ratio units Final   Comment:                                   T. Chol/HDL Ratio                                             Men  Women                               1/2 Avg.Risk  3.4    3.3                                   Avg.Risk  5.0    4.4                                2X Avg.Risk  9.6    7.1                                3X Avg.Risk 23.4   11.0   . Glucose 07/26/2015 92  65 - 99 mg/dL Final  . BUN 07/26/2015 15  8 - 27 mg/dL Final  . Creatinine, Ser 07/26/2015 0.68  0.57 - 1.00 mg/dL Final  . GFR calc non Af Amer 07/26/2015 82  >59 mL/min/1.73 Final  . GFR calc Af Amer 07/26/2015 94  >59 mL/min/1.73 Final  . BUN/Creatinine Ratio 07/26/2015 22  11 - 26 Final  . Sodium 07/26/2015 143  134 - 144 mmol/L Final  . Potassium 07/26/2015 3.7  3.5 - 5.2 mmol/L Final  . Chloride  07/26/2015 104  97 - 108 mmol/L Final  . CO2 07/26/2015 25  18 - 29 mmol/L Final  . Calcium 07/26/2015 9.0  8.7 - 10.3 mg/dL Final  . Total Protein 07/26/2015 6.3  6.0 - 8.5 g/dL Final  . Albumin 07/26/2015 3.9  3.5 - 4.7 g/dL Final  . Globulin, Total 07/26/2015 2.4  1.5 - 4.5 g/dL Final  . Albumin/Globulin Ratio 07/26/2015 1.6  1.1 - 2.5 Final  . Bilirubin Total 07/26/2015 0.3  0.0 - 1.2 mg/dL Final  . Alkaline Phosphatase 07/26/2015 55  39 - 117 IU/L Final  . AST 07/26/2015 21  0 - 40 IU/L Final  . ALT 07/26/2015 17  0 - 32 IU/L Final   07/28/2015 EKG: Rate 48. Left  ward axis. ST and T abnormalities. Consider high lateral ischemia or left ventricular strain.  Assessment/Plan  1. Bradycardia Lengthy discussion about this issue with patient. In general, think this is a chronic issue with her and since she is asymptomatic, I do not think she needs follow-up cardiac referral at this time. She was advised that if the discomfort in the epigastric area did not resolve by regular use of Prilosec or Nexium, that she should get back in touch with me for cardiac referral.  2. Midline low back pain with left-sided sciatica Continue with Dr. Ninfa Linden  3. Essential hypertension Controlled - Comprehensive metabolic panel; Future  4. Hyperglycemia Controlled. Continue weight loss. - Comprehensive metabolic panel; Future - Microalbumin, urine; Future - Hemoglobin A1c; Future  5. Obese Patient congratulated on her weight loss. She is to continue to work towards getting the weight down further. I advised her that her hyperglycemia would likely further improve as she lost weight.  6. Insomnia Continue zolpidem  7. HYPERCHOLESTEROLEMIA Controlled - Lipid panel; Future

## 2015-07-29 DIAGNOSIS — H40013 Open angle with borderline findings, low risk, bilateral: Secondary | ICD-10-CM | POA: Diagnosis not present

## 2015-07-29 NOTE — Addendum Note (Signed)
Addended by: Eilene Ghazi on: 07/29/2015 09:48 AM   Modules accepted: Orders

## 2015-08-05 DIAGNOSIS — M545 Low back pain: Secondary | ICD-10-CM | POA: Diagnosis not present

## 2015-08-05 DIAGNOSIS — M79604 Pain in right leg: Secondary | ICD-10-CM | POA: Diagnosis not present

## 2015-08-12 DIAGNOSIS — M5416 Radiculopathy, lumbar region: Secondary | ICD-10-CM | POA: Diagnosis not present

## 2015-08-12 DIAGNOSIS — M5116 Intervertebral disc disorders with radiculopathy, lumbar region: Secondary | ICD-10-CM | POA: Diagnosis not present

## 2015-08-12 DIAGNOSIS — M4806 Spinal stenosis, lumbar region: Secondary | ICD-10-CM | POA: Diagnosis not present

## 2015-08-26 LAB — HM PAP SMEAR: HM PAP: NORMAL

## 2015-09-02 DIAGNOSIS — M5116 Intervertebral disc disorders with radiculopathy, lumbar region: Secondary | ICD-10-CM | POA: Diagnosis not present

## 2015-09-02 DIAGNOSIS — M5416 Radiculopathy, lumbar region: Secondary | ICD-10-CM | POA: Diagnosis not present

## 2015-09-02 DIAGNOSIS — M4806 Spinal stenosis, lumbar region: Secondary | ICD-10-CM | POA: Diagnosis not present

## 2015-09-07 DIAGNOSIS — M5416 Radiculopathy, lumbar region: Secondary | ICD-10-CM | POA: Diagnosis not present

## 2015-09-07 DIAGNOSIS — M4806 Spinal stenosis, lumbar region: Secondary | ICD-10-CM | POA: Diagnosis not present

## 2015-10-07 DIAGNOSIS — M5416 Radiculopathy, lumbar region: Secondary | ICD-10-CM | POA: Diagnosis not present

## 2015-10-07 DIAGNOSIS — M4806 Spinal stenosis, lumbar region: Secondary | ICD-10-CM | POA: Diagnosis not present

## 2015-10-11 ENCOUNTER — Ambulatory Visit (INDEPENDENT_AMBULATORY_CARE_PROVIDER_SITE_OTHER): Payer: Medicare Other

## 2015-10-11 DIAGNOSIS — Z23 Encounter for immunization: Secondary | ICD-10-CM

## 2015-10-19 DIAGNOSIS — M4806 Spinal stenosis, lumbar region: Secondary | ICD-10-CM | POA: Diagnosis not present

## 2015-10-19 DIAGNOSIS — M5416 Radiculopathy, lumbar region: Secondary | ICD-10-CM | POA: Diagnosis not present

## 2015-11-04 DIAGNOSIS — M545 Low back pain: Secondary | ICD-10-CM | POA: Diagnosis not present

## 2015-11-26 ENCOUNTER — Other Ambulatory Visit: Payer: Medicare Other

## 2015-11-26 DIAGNOSIS — R739 Hyperglycemia, unspecified: Secondary | ICD-10-CM | POA: Diagnosis not present

## 2015-11-26 DIAGNOSIS — I10 Essential (primary) hypertension: Secondary | ICD-10-CM

## 2015-11-26 DIAGNOSIS — E7800 Pure hypercholesterolemia, unspecified: Secondary | ICD-10-CM | POA: Diagnosis not present

## 2015-11-27 LAB — COMPREHENSIVE METABOLIC PANEL WITH GFR
ALT: 17 [IU]/L (ref 0–32)
AST: 19 [IU]/L (ref 0–40)
Albumin/Globulin Ratio: 1.3 (ref 1.1–2.5)
Albumin: 3.7 g/dL (ref 3.5–4.7)
Alkaline Phosphatase: 63 [IU]/L (ref 39–117)
BUN/Creatinine Ratio: 25 (ref 11–26)
BUN: 20 mg/dL (ref 8–27)
Bilirubin Total: 0.4 mg/dL (ref 0.0–1.2)
CO2: 25 mmol/L (ref 18–29)
Calcium: 9.1 mg/dL (ref 8.7–10.3)
Chloride: 103 mmol/L (ref 97–106)
Creatinine, Ser: 0.79 mg/dL (ref 0.57–1.00)
GFR calc Af Amer: 81 mL/min/{1.73_m2}
GFR calc non Af Amer: 70 mL/min/{1.73_m2}
Globulin, Total: 2.8 g/dL (ref 1.5–4.5)
Glucose: 90 mg/dL (ref 65–99)
Potassium: 3.6 mmol/L (ref 3.5–5.2)
Sodium: 142 mmol/L (ref 136–144)
Total Protein: 6.5 g/dL (ref 6.0–8.5)

## 2015-11-27 LAB — MICROALBUMIN, URINE: Microalbumin, Urine: 11.5 ug/mL

## 2015-11-27 LAB — LIPID PANEL
Chol/HDL Ratio: 4.2 ratio (ref 0.0–4.4)
Cholesterol, Total: 200 mg/dL — ABNORMAL HIGH (ref 100–199)
HDL: 48 mg/dL
LDL Calculated: 127 mg/dL — ABNORMAL HIGH (ref 0–99)
Triglycerides: 127 mg/dL (ref 0–149)
VLDL Cholesterol Cal: 25 mg/dL (ref 5–40)

## 2015-11-27 LAB — HEMOGLOBIN A1C
ESTIMATED AVERAGE GLUCOSE: 120 mg/dL
HEMOGLOBIN A1C: 5.8 % — AB (ref 4.8–5.6)

## 2015-11-30 ENCOUNTER — Encounter: Payer: Self-pay | Admitting: Internal Medicine

## 2015-11-30 ENCOUNTER — Ambulatory Visit (INDEPENDENT_AMBULATORY_CARE_PROVIDER_SITE_OTHER): Payer: Medicare Other | Admitting: Internal Medicine

## 2015-11-30 VITALS — BP 120/78 | HR 45 | Temp 98.3°F | Resp 12 | Ht 62.0 in | Wt 183.0 lb

## 2015-11-30 DIAGNOSIS — K222 Esophageal obstruction: Secondary | ICD-10-CM

## 2015-11-30 DIAGNOSIS — I10 Essential (primary) hypertension: Secondary | ICD-10-CM | POA: Diagnosis not present

## 2015-11-30 DIAGNOSIS — M62838 Other muscle spasm: Secondary | ICD-10-CM | POA: Diagnosis not present

## 2015-11-30 DIAGNOSIS — K2100 Gastro-esophageal reflux disease with esophagitis, without bleeding: Secondary | ICD-10-CM

## 2015-11-30 DIAGNOSIS — E7800 Pure hypercholesterolemia, unspecified: Secondary | ICD-10-CM | POA: Diagnosis not present

## 2015-11-30 DIAGNOSIS — M129 Arthropathy, unspecified: Secondary | ICD-10-CM

## 2015-11-30 DIAGNOSIS — E669 Obesity, unspecified: Secondary | ICD-10-CM

## 2015-11-30 DIAGNOSIS — R739 Hyperglycemia, unspecified: Secondary | ICD-10-CM | POA: Diagnosis not present

## 2015-11-30 DIAGNOSIS — R609 Edema, unspecified: Secondary | ICD-10-CM

## 2015-11-30 DIAGNOSIS — M1711 Unilateral primary osteoarthritis, right knee: Secondary | ICD-10-CM

## 2015-11-30 MED ORDER — PANTOPRAZOLE SODIUM 40 MG PO TBEC: DELAYED_RELEASE_TABLET | ORAL | Status: DC

## 2015-11-30 MED ORDER — PRAVASTATIN SODIUM 20 MG PO TABS
ORAL_TABLET | ORAL | Status: DC
Start: 2015-11-30 — End: 2015-12-15

## 2015-11-30 NOTE — Progress Notes (Signed)
Patient ID: [name], female   DOB: Dec 27, 1930, 81 y.o.   MRN: [id number]    HISTORY AND PHYSICAL  Location:    [facility]   Place of Service:   OFFICE  Extended Emergency Contact Information Primary Emergency Contact: Joens,Owen Address: [address]          [place], [place] [address] [place] Phone: [phone] Mobile Phone: [phone] Relation: Spouse  Advanced Directive information Does patient have an advance directive?: Yes, Type of Advance Directive: Healthcare Power of Attorney, Does patient want to make changes to advanced directive?: No - Patient declined  Chief Complaint  Patient presents with  . Annual Exam    Yearly check-up, discuss labs (copy printed), EKG completed 07/2015. MMSE 30/30, failed clock drawing   . Medication Management    Discuss Lipitor- patient with muscle cramps(stopped x 3 weeks) and Ambien (switched to trazodone)  . Immunizations    Refused Tdap    HPI:  Leg cramps 8/10 for pain for 2 months. Occur regulalry in the early morning. Pharmacist told her to stop the Lipitor and she seemed to get better. She would like to change to another medication.  Fell 2 days ago in the kitchen. Bruised right knee anteriorly.  She stopped Ambien and started trazodone. It helped her sleep, but she worries it may aggravate glaucoma.  She started using Tylenol 6 tabs daily. Aggravated her stomach with nausea and some burning. It is better since she cut back on the Tylenol. Has also had epidural injections from Dr. Laurence Spates and Ninfa Linden. She has been using omeprazole, but feels she needs something stronger.  Essential hypertension - controlled  Hyperglycemia - controlled  Obese - no significant weight loss  Edema, unspecified type - resolved  HYPERCHOLESTEROLEMIA  - controlled  Gastroesophageal reflux disease with esophagitis - worse   Arthritis of knee, right - worse  ESOPHAGEAL STRICTURE - asymptomatic    Past Medical  History  Diagnosis Date  . Anxiety state, unspecified   . Backache, unspecified   . Diverticulosis of colon (without mention of hemorrhage)   . Stricture and stenosis of esophagus   . Esophageal reflux   . Enthesopathy of hip region   . Diaphragmatic hernia without mention of obstruction or gangrene   . Pain in joint, pelvic region and thigh   . Pure hypercholesterolemia   . Unspecified essential hypertension   . Elevated blood pressure reading without diagnosis of hypertension   . Pain in limb   . Other dysphagia   . Cramp of limb   . Chest pain, unspecified   . Other nonspecific abnormal serum enzyme levels   . Hepatitis, unspecified   . Disturbance of skin sensation   . Unspecified vitamin D deficiency   . Pain in joint, hand   . Other abnormal blood chemistry   . Pain in joint, ankle and foot   . Plantar fascial fibromatosis   . Unspecified hypothyroidism   . Asymptomatic varicose veins   . Allergic rhinitis due to pollen   . Myalgia and myositis, unspecified   . Pain in joint, lower leg   . Sciatica   . Reflux esophagitis   . Anxiety state, unspecified   . Depressive disorder, not elsewhere classified   . Other specified cardiac dysrhythmias(427.89)   . Other malaise and fatigue   . Insomnia, unspecified   . Carpal tunnel syndrome   . Spinal stenosis, unspecified region other than cervical   . Lumbago   .  Symptomatic menopausal or female climacteric states   . Unspecified essential hypertension   . Headache(784.0)   . Other and unspecified hyperlipidemia   . Obesity, unspecified   . Diaphragmatic hernia without mention of obstruction or gangrene   . Peptic ulcer, unspecified site, unspecified as acute or chronic, without mention of hemorrhage, perforation, or obstruction     Past Surgical History  Procedure Laterality Date  . Cholecystectomy  1989    DR BOWMAN  . Abdominal hysterectomy  1979  . Knee surgery  2003  . Breast biopsy      twice  . Excision of  actinic keratosis      DR LUPTON   . O.s. cataract removed  08/03/1999    DR EPES   . O.d. cataract removed  2000    DR EPES  . Laser for cloudy cap left eye  03/2006    DR DIGBY  . Replacement total knee Right 04/2006    DR RENDALL  . Replacement total knee Left 04/2004    DR RENDALL  . Knee arthroscopy Right 06-26-13  . Total knee arthroplasty Right 07/04/2013    Procedure: RIGHT TOTAL KNEE ARTHROPLASTY;  Surgeon: Mcarthur Rossetti, MD;  Location: [facility];  Service: Orthopedics;  Laterality: Right;  . Knee closed reduction Right 10/23/2013    Procedure: CLOSED MANIPULATION RIGHT KNEE;  Surgeon: Mcarthur Rossetti, MD;  Location: [facility];  Service: Orthopedics;  Laterality: Right;  . Colonoscopy  1988    Normal     Patient Care Team: Estill Dooms, MD as PCP - General (Internal Medicine) Mcarthur Rossetti, MD as Attending Physician (Orthopedic Surgery) Larey Dresser, MD as Attending Physician (Cardiology) Thornell Sartorius, MD as Consulting Physician (Otolaryngology) Druscilla Brownie, MD as Consulting Physician (Dermatology) Calvert Cantor, MD as Consulting Physician (Ophthalmology)  Social History   Social History  . Marital Status: Married    Spouse Name: N/A  . Number of Children: N/A  . Years of Education: N/A   Occupational History  . Not on file.   Social History Main Topics  . Smoking status: Never Smoker   . Smokeless tobacco: Never Used  . Alcohol Use: No  . Drug Use: No  . Sexual Activity: Not Currently   Other Topics Concern  . Not on file   Social History Narrative    reports that she has never smoked. She has never used smokeless tobacco. She reports that she does not drink alcohol or use illicit drugs.  Family History  Problem Relation Age of Onset  . Ovarian cancer Mother   . Uterine cancer Mother   . Heart disease Father   . Cerebrovascular Accident Mother   . Hypertension Brother   . Obesity Daughter    Family Status  Relation  Status Death Age  . Mother Deceased   . Father Deceased   . Sister Alive   . Daughter Alive     Immunization History  Administered Date(s) Administered  . Influenza,inj,Quad PF,36+ Mos 09/23/2013, 09/25/2014, 10/11/2015  . Pneumococcal Conjugate-13 02/17/2015  . Zoster 01/29/2008    Allergies  Allergen Reactions  . Advil [Ibuprofen]     GI upset  . Aleve [Naproxen Sodium]     GI upset  . Aspirin Nausea And Vomiting    Takes baby aspirin qod without problems  . Celebrex [Celecoxib]     GI upset  . Diclofenac     GI upset  . Doxycycline     Headache  .  Metoclopramide Hcl     REACTION: heart palps  . Nsaids Other (See Comments)    Tears my stomach up   . Other     Antihistamine - increase BP  . Timolol     Dropped HR    Medications: Patient's Medications  New Prescriptions   No medications on file  Previous Medications   ACETAMINOPHEN (TYLENOL) 500 MG TABLET    Take 500 mg by mouth as needed.   ALPRAZOLAM (XANAX) 0.25 MG TABLET    TAKE 1/2 TABLET AT BEDTIME AS NEEDED FOR ANXIETY.   ATORVASTATIN (LIPITOR) 10 MG TABLET    TAKE 1 TABLET BY MOUTH EVERY DAY   AZELASTINE (OPTIVAR) 0.05 % OPHTHALMIC SOLUTION    Place 1 drop into both eyes 2 (two) times daily.    BIMATOPROST (LUMIGAN) 0.01 % SOLN    Instill 1 drop into both eyes once daily in the evening   CHOLECALCIFEROL (VITAMIN D) 2000 UNITS TABLET    Take 2,000 Units by mouth daily.   DICLOFENAC SODIUM (VOLTAREN) 1 % GEL    Apply 2 g topically daily as needed (for knee pain).    DORZOLAMIDE (TRUSOPT) 2 % OPHTHALMIC SOLUTION    Place 1 drop into both eyes 3 (three) times daily.    GLUCOSAMINE-CHONDROITIN 500-400 MG TABLET    Take 2 tablets by mouth daily.    METHYLCELLULOSE (ARTIFICIAL TEARS) 1 % OPHTHALMIC SOLUTION    Place 1 drop into both eyes daily as needed (for dry eyes).    TRAZODONE (DESYREL) 50 MG TABLET    Take 25 mg by mouth at bedtime.   VALSARTAN-HYDROCHLOROTHIAZIDE (DIOVAN-HCT) 320-25 MG PER TABLET    TAKE  1 TABLET BY MOUTH EVERY DAY TO CONTROL BLOOD PRESSURE   VITAMIN E 400 UNIT CAPSULE    Take 400 Units by mouth daily.  Modified Medications   No medications on file  Discontinued Medications   ZOLPIDEM (AMBIEN) 10 MG TABLET    Take 1/2 tablet by mouth at bedtime as needed for sleep    Review of Systems  Constitutional: Positive for activity change. Negative for fever, chills, diaphoresis, appetite change, fatigue and unexpected weight change.  HENT: Negative.   Eyes: Positive for discharge.       Corrective lenses.  Respiratory: Negative.   Cardiovascular: Positive for leg swelling. Negative for chest pain and palpitations.  Gastrointestinal:       Frequent reflux and heartburn.  Endocrine:       History of elevations in glucose. Diet controlled.  Genitourinary: Positive for frequency.       Urinary leakage,  Musculoskeletal:       Chronic back pains. Right knee painful. Had surgery 07/04/13 by Dr. Ninfa Linden. Using tramadol. Having some left neck discomfort.  Skin: Negative.   Neurological: Positive for numbness. Negative for dizziness, tremors, seizures, syncope, facial asymmetry, speech difficulty, weakness, light-headedness and headaches.       Episodes of numbness in the right hand.  Hematological: Negative.   Psychiatric/Behavioral: Negative.        Some difficulty falling asleep. Using less Ambien now.    Filed Vitals:   11/30/15 1451  BP: 120/78  Pulse: 45  Temp: 98.3 F (36.8 C)  TempSrc: Oral  Resp: 12  Height: 5' 2"  (1.575 m)  Weight: 183 lb (83.008 kg)  SpO2: 98%   Body mass index is 33.46 kg/(m^2).  Physical Exam  Constitutional: She is oriented to person, place, and time. No distress.  Obese.  HENT:  Head: Normocephalic and atraumatic.  Right Ear: External ear normal.  Left Ear: External ear normal.  Eyes: Conjunctivae and EOM are normal. Pupils are equal, round, and reactive to light. Left eye exhibits no discharge. No scleral icterus.  Neck: Normal  range of motion. Neck supple. No JVD present. No tracheal deviation present. No thyromegaly present.  Cardiovascular: Regular rhythm and intact distal pulses.  Exam reveals no gallop and no friction rub.   No murmur heard. Bradycardia with a rate of 48  Pulmonary/Chest: Breath sounds normal. No respiratory distress. She has no wheezes. She has no rales. She exhibits no tenderness.  Abdominal: Soft. Bowel sounds are normal. She exhibits no distension and no mass. There is no tenderness.  Mild epigastric discomfort with deep palpation. No abdominal bruit.  Musculoskeletal: She exhibits tenderness. She exhibits no edema.  Right knee S/P TKR.   Lymphadenopathy:    She has no cervical adenopathy.  Neurological: She is alert and oriented to person, place, and time. No cranial nerve deficit. Coordination normal.  11/30/15 MMSE 30/30. Failed clock drawing.  Skin: No rash noted. No erythema. No pallor.  Psychiatric: She has a normal mood and affect. Her behavior is normal. Judgment and thought content normal.    Labs reviewed: Lab Summary Latest Ref Rng 11/26/2015 07/26/2015 02/15/2015  Hemoglobin 13.0-17.0 g/dL (None) (None) (None)  Hematocrit 39.0-52.0 % (None) (None) (None)  White count - (None) (None) (None)  Platelet count - (None) (None) (None)  Sodium 136 - 144 mmol/L 142 143 142  Potassium 3.5 - 5.2 mmol/L 3.6 3.7 3.6  Calcium 8.7 - 10.3 mg/dL 9.1 9.0 9.2  Phosphorus - (None) (None) (None)  Creatinine 0.57 - 1.00 mg/dL 0.79 0.68 0.70  AST 0 - 40 IU/L 19 21 (None)  Alk Phos 39 - 117 IU/L 63 55 (None)  Bilirubin 0.0 - 1.2 mg/dL 0.4 0.3 (None)  Glucose 65 - 99 mg/dL 90 92 99  Cholesterol - (None) (None) (None)  HDL cholesterol >39 mg/dL 48 42 43  Triglycerides 0 - 149 mg/dL 127 143 258(H)  LDL Direct - (None) (None) (None)  LDL Calc 0 - 99 mg/dL 127(H) 80 91  Total protein - (None) (None) (None)  Albumin 3.5 - 4.7 g/dL 3.7 3.9 (None)   Lab Results  Component Value Date   BUN 20  11/26/2015   Lab Results  Component Value Date   HGBA1C 5.8* 11/26/2015   Lab Results  Component Value Date   TSH 3.870 07/26/2015     Assessment/Plan  1. Essential hypertension Controlled  2. Hyperglycemia Controlled - Comprehensive metabolic panel; Future - Microalbumin, urine; Future - Hemoglobin A1c; Future  3. Obese Encouraged weight loss  4. Edema, unspecified type Resolved  5. HYPERCHOLESTEROLEMIA Controlled - pravastatin (PRAVACHOL) 20 MG tablet; One daily to control cholesterol  Dispense: 30 tablet; Refill: 5 - Lipid panel; Future  6. Gastroesophageal reflux disease with esophagitis Recent increase in symptoms. Discontinue omeprazole -Start pantoprazole (PROTONIX) 40 MG tablet; One nightly to reduce stomach acid  Dispense: 30 tablet; Refill: 5  7. Arthritis of knee, right Stable. Continue exercising  8. ESOPHAGEAL STRICTURE - pantoprazole (PROTONIX) 40 MG tablet; One nightly to reduce stomach acid  Dispense: 30 tablet; Refill: 5  9. Muscle spasm - Comprehensive metabolic panel; Future

## 2015-12-14 ENCOUNTER — Telehealth: Payer: Self-pay | Admitting: *Deleted

## 2015-12-14 NOTE — Telephone Encounter (Signed)
Patient called and stated that she came in to see dr Nyoka Cowden regarding severe leg cramps and Dr. Nyoka Cowden took her off of her Lipitor and it got better. Placed her on Pravastatin and now patient is having the leg cramps again. Patient STOPPED the Pravastatin and the leg cramps stopped. Patient wants to know what she should do about cholesterol medication. Please Advise.

## 2015-12-15 ENCOUNTER — Other Ambulatory Visit: Payer: Self-pay | Admitting: Internal Medicine

## 2015-12-15 NOTE — Telephone Encounter (Signed)
Stay off of pravastatin. Given the fact that her bad cholesterol, LDL, is only modestly elevated, I would concentrate on weight loss and a low saturated fat diet. Fats that are hard at room temperature should be avoided. No new medication at this time. Lipids will be checked again prior to her next visit. Sorrythe leg cramps occurred again.

## 2015-12-16 NOTE — Telephone Encounter (Signed)
Patient Notified

## 2016-01-06 DIAGNOSIS — H04123 Dry eye syndrome of bilateral lacrimal glands: Secondary | ICD-10-CM | POA: Diagnosis not present

## 2016-01-06 DIAGNOSIS — H40013 Open angle with borderline findings, low risk, bilateral: Secondary | ICD-10-CM | POA: Diagnosis not present

## 2016-01-10 ENCOUNTER — Other Ambulatory Visit: Payer: Self-pay | Admitting: Internal Medicine

## 2016-02-03 DIAGNOSIS — H04123 Dry eye syndrome of bilateral lacrimal glands: Secondary | ICD-10-CM | POA: Diagnosis not present

## 2016-02-03 DIAGNOSIS — H40013 Open angle with borderline findings, low risk, bilateral: Secondary | ICD-10-CM | POA: Diagnosis not present

## 2016-02-09 DIAGNOSIS — Z9189 Other specified personal risk factors, not elsewhere classified: Secondary | ICD-10-CM | POA: Diagnosis not present

## 2016-02-18 ENCOUNTER — Telehealth: Payer: Self-pay | Admitting: *Deleted

## 2016-02-18 MED ORDER — AMLODIPINE BESYLATE 2.5 MG PO TABS: ORAL_TABLET | ORAL | Status: DC

## 2016-02-18 NOTE — Telephone Encounter (Signed)
For the last 3 weeks the top BP has been reaching to 169 and then will fall back to 119. Patient states it is up and down and when it goes up she feels alittle dizzy.  This morning she took it and it was 149/79. The bottom numbers run good but the top number is all over the place. Patient takes Valsartan HCT 320/25mg . Please Advise.

## 2016-02-18 NOTE — Telephone Encounter (Signed)
Patient notified and agreed. Faxed to pharmacy.  

## 2016-02-18 NOTE — Telephone Encounter (Signed)
Add amlodipine 2.5 mg one each morning to control blood pressure. Call in Prescription for 30 tablets with 5 refills.

## 2016-03-06 DIAGNOSIS — J040 Acute laryngitis: Secondary | ICD-10-CM | POA: Diagnosis not present

## 2016-03-06 DIAGNOSIS — J322 Chronic ethmoidal sinusitis: Secondary | ICD-10-CM | POA: Diagnosis not present

## 2016-03-06 DIAGNOSIS — J320 Chronic maxillary sinusitis: Secondary | ICD-10-CM | POA: Diagnosis not present

## 2016-03-15 DIAGNOSIS — J040 Acute laryngitis: Secondary | ICD-10-CM | POA: Diagnosis not present

## 2016-03-15 DIAGNOSIS — J320 Chronic maxillary sinusitis: Secondary | ICD-10-CM | POA: Diagnosis not present

## 2016-03-15 DIAGNOSIS — R05 Cough: Secondary | ICD-10-CM | POA: Diagnosis not present

## 2016-03-24 DIAGNOSIS — Z803 Family history of malignant neoplasm of breast: Secondary | ICD-10-CM | POA: Diagnosis not present

## 2016-03-24 DIAGNOSIS — Z1231 Encounter for screening mammogram for malignant neoplasm of breast: Secondary | ICD-10-CM | POA: Diagnosis not present

## 2016-03-24 LAB — HM MAMMOGRAPHY: HM Mammogram: NORMAL (ref 0–4)

## 2016-03-27 ENCOUNTER — Encounter: Payer: Self-pay | Admitting: *Deleted

## 2016-03-27 ENCOUNTER — Other Ambulatory Visit: Payer: Medicare Other

## 2016-03-27 DIAGNOSIS — M62838 Other muscle spasm: Secondary | ICD-10-CM | POA: Diagnosis not present

## 2016-03-27 DIAGNOSIS — R739 Hyperglycemia, unspecified: Secondary | ICD-10-CM

## 2016-03-27 DIAGNOSIS — E7800 Pure hypercholesterolemia, unspecified: Secondary | ICD-10-CM | POA: Diagnosis not present

## 2016-03-28 LAB — COMPREHENSIVE METABOLIC PANEL
A/G RATIO: 1.2 (ref 1.2–2.2)
ALK PHOS: 64 IU/L (ref 39–117)
ALT: 16 IU/L (ref 0–32)
AST: 20 IU/L (ref 0–40)
Albumin: 3.6 g/dL (ref 3.5–4.7)
BILIRUBIN TOTAL: 0.3 mg/dL (ref 0.0–1.2)
BUN/Creatinine Ratio: 24 (ref 12–28)
BUN: 17 mg/dL (ref 8–27)
CHLORIDE: 103 mmol/L (ref 96–106)
CO2: 24 mmol/L (ref 18–29)
Calcium: 9 mg/dL (ref 8.7–10.3)
Creatinine, Ser: 0.7 mg/dL (ref 0.57–1.00)
GFR calc Af Amer: 93 mL/min/{1.73_m2} (ref >59)
GFR calc non Af Amer: 81 mL/min/{1.73_m2} (ref >59)
GLUCOSE: 93 mg/dL (ref 65–99)
Globulin, Total: 3 g/dL (ref 1.5–4.5)
POTASSIUM: 3.9 mmol/L (ref 3.5–5.2)
Sodium: 141 mmol/L (ref 134–144)
Total Protein: 6.6 g/dL (ref 6.0–8.5)

## 2016-03-28 LAB — HEMOGLOBIN A1C
ESTIMATED AVERAGE GLUCOSE: 114 mg/dL
Hgb A1c MFr Bld: 5.6 % (ref 4.8–5.6)

## 2016-03-28 LAB — LIPID PANEL
CHOL/HDL RATIO: 4.3 ratio (ref 0.0–4.4)
Cholesterol, Total: 179 mg/dL (ref 100–199)
HDL: 42 mg/dL (ref >39)
LDL CALC: 99 mg/dL (ref 0–99)
TRIGLYCERIDES: 190 mg/dL — AB (ref 0–149)
VLDL CHOLESTEROL CAL: 38 mg/dL (ref 5–40)

## 2016-03-28 LAB — MICROALBUMIN, URINE: MICROALBUM., U, RANDOM: 18.2 ug/mL

## 2016-03-29 ENCOUNTER — Ambulatory Visit (INDEPENDENT_AMBULATORY_CARE_PROVIDER_SITE_OTHER): Payer: Medicare Other | Admitting: Internal Medicine

## 2016-03-29 ENCOUNTER — Encounter: Payer: Self-pay | Admitting: Internal Medicine

## 2016-03-29 DIAGNOSIS — R739 Hyperglycemia, unspecified: Secondary | ICD-10-CM

## 2016-03-29 DIAGNOSIS — E7800 Pure hypercholesterolemia, unspecified: Secondary | ICD-10-CM

## 2016-03-29 DIAGNOSIS — R609 Edema, unspecified: Secondary | ICD-10-CM | POA: Diagnosis not present

## 2016-03-29 DIAGNOSIS — I10 Essential (primary) hypertension: Secondary | ICD-10-CM

## 2016-03-29 DIAGNOSIS — I481 Persistent atrial fibrillation: Secondary | ICD-10-CM | POA: Diagnosis not present

## 2016-03-29 DIAGNOSIS — I4891 Unspecified atrial fibrillation: Secondary | ICD-10-CM | POA: Insufficient documentation

## 2016-03-29 DIAGNOSIS — I4819 Other persistent atrial fibrillation: Secondary | ICD-10-CM

## 2016-03-29 DIAGNOSIS — E669 Obesity, unspecified: Secondary | ICD-10-CM | POA: Diagnosis not present

## 2016-03-29 MED ORDER — APIXABAN 5 MG PO TABS: ORAL_TABLET | ORAL | Status: DC

## 2016-03-29 MED ORDER — METOPROLOL TARTRATE 25 MG PO TABS: 25.0000 mg | ORAL_TABLET | Freq: Two times a day (BID) | ORAL | Status: DC

## 2016-03-29 NOTE — Addendum Note (Signed)
Addended by: Ripley Fraise on: 03/29/2016 05:52 PM   Modules accepted: Orders

## 2016-03-29 NOTE — Patient Instructions (Signed)
Stop amlodipine. Start Eliquis and metoprolol. See cardiologist when appointment is made.

## 2016-03-29 NOTE — Progress Notes (Addendum)
Patient ID: [name], female   DOB: 1931-06-19, 82 y.o.   MRN: [id number]   Location:  [facility]  Provider: Jeanmarie [name], M.D.  Code Status: Full Goals of Care:  Advanced Directives 03/29/2016  Does patient have an advance directive? Yes  Type of Advance Directive [place]  Does patient want to make changes to advanced directive? -  Copy of advanced directive(s) in chart? Yes     Chief Complaint  Patient presents with  . Medical Management of Chronic Issues    blood pressure, hyperglyceima, GERD  . Full feeling    under left breast up center chest and across chest, "doesn't feel well", also dizzy. Had been on high dose of Tylenol for back/leg pain. 500 mg two three times daily for one month, then started to have stomach problems. No longer on Tylenol.    HPI: Patient is a 82 y.o. female seen today for medical management of chronic diseases.    NEW finding of atrial fibrillation. EKG today shows a heart rate of 120 and atrial fibrillation. Patient generally runs a bradycardia with rates in the high 40s. On March 08, 2016 rate changed to 120-150 range. She became much more easily fatigued since then.  Patient has noted discomfort under the left breast and in the center chest as well as being slightly dizzy, nauseous, and headache. Feet and ankles have showed some swelling over above normal for the patient.  Had pain in the right arm last night that radiated into the neck. Better after taking Tylenol and drinking a glass of water. Did not feel dyspneic, but she was a little nauseous. No diaphoresis. No pain at this time.   Past Medical History  Diagnosis Date  . Anxiety state, unspecified   . Backache, unspecified   . Diverticulosis of colon (without mention of hemorrhage)   . Stricture and stenosis of esophagus   . Esophageal reflux   . Enthesopathy of hip region   . Diaphragmatic hernia without mention of obstruction or gangrene   . Pain in joint,  pelvic region and thigh   . Pure hypercholesterolemia   . Unspecified essential hypertension   . Elevated blood pressure reading without diagnosis of hypertension   . Pain in limb   . Other dysphagia   . Cramp of limb   . Chest pain, unspecified   . Other nonspecific abnormal serum enzyme levels   . Hepatitis, unspecified   . Disturbance of skin sensation   . Unspecified vitamin D deficiency   . Pain in joint, hand   . Other abnormal blood chemistry   . Pain in joint, ankle and foot   . Plantar fascial fibromatosis   . Unspecified hypothyroidism   . Asymptomatic varicose veins   . Allergic rhinitis due to pollen   . Myalgia and myositis, unspecified   . Pain in joint, lower leg   . Sciatica   . Reflux esophagitis   . Anxiety state, unspecified   . Depressive disorder, not elsewhere classified   . Other specified cardiac dysrhythmias(427.89)   . Other malaise and fatigue   . Insomnia, unspecified   . Carpal tunnel syndrome   . Spinal stenosis, unspecified region other than cervical   . Lumbago   . Symptomatic menopausal or female climacteric states   . Unspecified essential hypertension   . Headache(784.0)   . Other and unspecified hyperlipidemia   . Obesity, unspecified   . Diaphragmatic hernia without mention of obstruction or gangrene   .  Peptic ulcer, unspecified site, unspecified as acute or chronic, without mention of hemorrhage, perforation, or obstruction     Past Surgical History  Procedure Laterality Date  . Cholecystectomy  1989    DR BOWMAN  . Abdominal hysterectomy  1979  . Knee surgery  2003  . Breast biopsy      twice  . Excision of actinic keratosis      DR LUPTON   . O.s. cataract removed  08/03/1999    DR EPES   . O.d. cataract removed  2000    DR EPES  . Laser for cloudy cap left eye  03/2006    DR DIGBY  . Replacement total knee Right 04/2006    DR RENDALL  . Replacement total knee Left 04/2004    DR RENDALL  . Knee arthroscopy Right  06-26-13  . Total knee arthroplasty Right 07/04/2013    Procedure: RIGHT TOTAL KNEE ARTHROPLASTY;  Surgeon: Mcarthur Rossetti, MD;  Location: [facility];  Service: Orthopedics;  Laterality: Right;  . Knee closed reduction Right 10/23/2013    Procedure: CLOSED MANIPULATION RIGHT KNEE;  Surgeon: Mcarthur Rossetti, MD;  Location: [facility];  Service: Orthopedics;  Laterality: Right;  . Colonoscopy  1988    Normal     Allergies  Allergen Reactions  . Advil [Ibuprofen]     GI upset  . Aleve [Naproxen Sodium]     GI upset  . Aspirin Nausea And Vomiting    Takes baby aspirin qod without problems  . Atorvastatin     Leg cramps  . Celebrex [Celecoxib]     GI upset  . Diclofenac     GI upset  . Doxycycline     Headache  . Metoclopramide Hcl     REACTION: heart palps  . Nsaids Other (See Comments)    Tears my stomach up   . Other     Antihistamine - increase BP  . Pravastatin     Leg cramps  . Timolol     Dropped HR      Medication List       This list is accurate as of: 03/29/16  2:36 PM.  Always use your most recent med list.               acetaminophen 500 MG tablet  Commonly known as:  TYLENOL  Take 500 mg by mouth as needed.     ALPRAZolam 0.25 MG tablet  Commonly known as:  XANAX  TAKE 1/2 TABLET AT BEDTIME AS NEEDED FOR ANXIETY.     amLODipine 2.5 MG tablet  Commonly known as:  NORVASC  Take one tablet by mouth each morning to control blood pressure     azelastine 0.05 % ophthalmic solution  Commonly known as:  OPTIVAR  Place 1 drop into both eyes 2 (two) times daily. As needed     bimatoprost 0.01 % Soln  Commonly known as:  LUMIGAN  Instill 1 drop into both eyes once daily in the evening     diclofenac sodium 1 % Gel  Commonly known as:  VOLTAREN  Apply 2 g topically daily as needed (for knee pain).     dorzolamide 2 % ophthalmic solution  Commonly known as:  TRUSOPT  Place 1 drop into both eyes 3 (three) times daily.     glucosamine-chondroitin  500-400 MG tablet  Take 2 tablets by mouth daily.     methylcellulose 1 % ophthalmic solution  Commonly known as:  ARTIFICIAL TEARS  Place 1 drop into both eyes daily as needed (for dry eyes).     pantoprazole 40 MG tablet  Commonly known as:  PROTONIX  One nightly to reduce stomach acid     traZODone 50 MG tablet  Commonly known as:  DESYREL  Take 25 mg by mouth at bedtime.     valsartan-hydrochlorothiazide 320-25 MG tablet  Commonly known as:  DIOVAN-HCT  TAKE 1 TABLET BY MOUTH EVERY DAY     Vitamin D 2000 units tablet  Take 2,000 Units by mouth daily.     vitamin E 400 UNIT capsule  Take 400 Units by mouth daily.        Review of Systems:  Review of Systems  Constitutional: Positive for activity change. Negative for fever, chills, diaphoresis, appetite change, fatigue and unexpected weight change.  HENT: Negative.   Eyes: Positive for discharge.       Corrective lenses.  Respiratory: Negative.   Cardiovascular: Positive for leg swelling. Negative for chest pain and palpitations.       Palpitations. Chest discomfort in mid chest and under left breast. Atrial fibrillation documented on EKG 03/29/16.  Gastrointestinal:       Frequent reflux and heartburn.  Endocrine:       History of elevations in glucose. Diet controlled.  Genitourinary: Positive for frequency.       Urinary leakage,  Musculoskeletal:       Chronic back pains. Right knee painful. Had surgery 07/04/13 by Dr. Ninfa Linden. Using tramadol. Having some left neck discomfort.  Skin: Negative.   Neurological: Positive for numbness. Negative for dizziness, tremors, seizures, syncope, facial asymmetry, speech difficulty, weakness, light-headedness and headaches.       Episodes of numbness in the right hand.  Hematological: Negative.   Psychiatric/Behavioral: Negative.        Some difficulty falling asleep. Using less Ambien now.    Health Maintenance  Topic Date Due  . TETANUS/TDAP  05/10/1952  . PNA vac Low  Risk Adult (2 of 2 - PPSV23) 02/18/2016  . INFLUENZA VACCINE  07/25/2016  . MAMMOGRAM  03/24/2017  . DEXA SCAN  Completed  . ZOSTAVAX  Completed    Physical Exam: There were no vitals filed for this visit. There is no weight on file to calculate BMI. Physical Exam  Constitutional: She is oriented to person, place, and time. No distress.  Obese.  HENT:  Head: Normocephalic and atraumatic.  Right Ear: External ear normal.  Left Ear: External ear normal.  Eyes: Conjunctivae and EOM are normal. Pupils are equal, round, and reactive to light. Left eye exhibits no discharge. No scleral icterus.  Neck: Normal range of motion. Neck supple. No JVD present. No tracheal deviation present. No thyromegaly present.  Cardiovascular: Intact distal pulses.  Exam reveals no gallop and no friction rub.   No murmur heard. Atrial fibrillation with rate of 120.  Pulmonary/Chest: Breath sounds normal. No respiratory distress. She has no wheezes. She has no rales. She exhibits no tenderness.  Abdominal: Soft. Bowel sounds are normal. She exhibits no distension and no mass. There is no tenderness.  Mild epigastric discomfort with deep palpation. No abdominal bruit.  Musculoskeletal: She exhibits tenderness. She exhibits no edema.  Right knee S/P TKR.   Lymphadenopathy:    She has no cervical adenopathy.  Neurological: She is alert and oriented to person, place, and time. No cranial nerve deficit. Coordination normal.  11/30/15 MMSE 30/30. Failed clock drawing.  Skin: No rash noted. No erythema. No  pallor.  Psychiatric: She has a normal mood and affect. Her behavior is normal. Judgment and thought content normal.    Labs reviewed: Basic Metabolic Panel:  Recent Labs  07/26/15 0939 11/26/15 1017 03/27/16 0853  NA 143 142 141  K 3.7 3.6 3.9  CL 104 103 103  CO2 25 25 24   GLUCOSE 92 90 93  BUN 15 20 17   CREATININE 0.68 0.79 0.70  CALCIUM 9.0 9.1 9.0  TSH 3.870  --   --    Liver Function  Tests:  Recent Labs  07/26/15 0939 11/26/15 1017 03/27/16 0853  AST 21 19 20   ALT 17 17 16   ALKPHOS 55 63 64  BILITOT 0.3 0.4 0.3  PROT 6.3 6.5 6.6  ALBUMIN 3.9 3.7 3.6   No results for input(s): LIPASE, AMYLASE in the last 8760 hours. No results for input(s): AMMONIA in the last 8760 hours. CBC: No results for input(s): WBC, NEUTROABS, HGB, HCT, MCV, PLT in the last 8760 hours. Lipid Panel:  Recent Labs  07/26/15 0939 11/26/15 1017 03/27/16 0853  CHOL 151 200* 179  HDL 42 48 42  LDLCALC 80 127* 99  TRIG 143 127 190*  CHOLHDL 3.6 4.2 4.3   Lab Results  Component Value Date   HGBA1C 5.6 03/27/2016     Assessment/Plan  1. Persistent atrial fibrillation (HCC) NEW - apixaban (ELIQUIS) 5 MG TABS tablet; One twice daily for anticoagulation  Dispense: 60 tablet; Refill: 5 - metoprolol tartrate (LOPRESSOR) 25 MG tablet; Take 1 tablet (25 mg total) by mouth 2 (two) times daily.  Dispense: 180 tablet; Refill: 3 - Ambulatory referral to Cardiology - CK Total and CKMB - Troponin I - EKG 12-Lead; Future  2. Essential hypertension controlled  3. Edema, unspecified type unchanged  4. HYPERCHOLESTEROLEMIA controlled  5. Hyperglycemia improved  6. Obese Weight unchanged since Dec 2016

## 2016-04-06 ENCOUNTER — Telehealth: Payer: Self-pay

## 2016-04-06 NOTE — Telephone Encounter (Signed)
Received request for additional information regarding PA that was started earlier today. I completed the forms and faxed them back to [phone].

## 2016-04-06 NOTE — Telephone Encounter (Signed)
Prior authorization was received for Eliquis 5 mg tablets. Prior authorization initiated via [email or web address]. Keyword: [id number]. DOB: 07-07-1931  Awaiting determination.

## 2016-04-10 ENCOUNTER — Encounter: Payer: Self-pay | Admitting: Cardiology

## 2016-04-10 ENCOUNTER — Ambulatory Visit (INDEPENDENT_AMBULATORY_CARE_PROVIDER_SITE_OTHER): Payer: Medicare Other | Admitting: Cardiology

## 2016-04-10 VITALS — BP 128/84 | HR 104 | Ht 61.0 in | Wt 182.8 lb

## 2016-04-10 DIAGNOSIS — R079 Chest pain, unspecified: Secondary | ICD-10-CM | POA: Insufficient documentation

## 2016-04-10 DIAGNOSIS — E669 Obesity, unspecified: Secondary | ICD-10-CM | POA: Diagnosis not present

## 2016-04-10 DIAGNOSIS — E7800 Pure hypercholesterolemia, unspecified: Secondary | ICD-10-CM | POA: Diagnosis not present

## 2016-04-10 DIAGNOSIS — I4819 Other persistent atrial fibrillation: Secondary | ICD-10-CM

## 2016-04-10 DIAGNOSIS — I481 Persistent atrial fibrillation: Secondary | ICD-10-CM | POA: Diagnosis not present

## 2016-04-10 DIAGNOSIS — R072 Precordial pain: Secondary | ICD-10-CM

## 2016-04-10 DIAGNOSIS — I10 Essential (primary) hypertension: Secondary | ICD-10-CM

## 2016-04-10 NOTE — Progress Notes (Signed)
Cardiology Office Note   Date:  04/10/2016   ID:  [name], DOB 09-27-1931, MRN [id number]  PCP:  Estill Dooms, MD  Cardiologist:   Peter Martinique, MD   Chief Complaint  Patient presents with  . New Evaluation    afib      History of Present Illness: [name] is a 82 y.o. female who presents for evaluation of atrial fibrillation at the request of Dr. Nyoka Cowden. She has a long history of marked sinus bradycardia. She was seen by Dr. Aundra Dubin in 2014. Echo at that time showed mild LAE otherwise normal. Holter showed mean HR 59 with lowest HR 43 and peak HR 109. She reports her HR is typically 48 but she was asymptomatic. On March 9 she noted an increased HR by BP monitor up to 153. At this time she felt marked indigestion from her waist to her neck. She felt her heart fluttering. She notes increased fatigue and is sleeping more. Generally feels lazy which is unusual for her. At times she has had severe right arm pain. She was seen by Dr. Nyoka Cowden on 03/29/16 and found to be in Afib with RVR. She was started on Eliquis and metoprolol. Since then she has felt better but still has epigastric discomfort and fatigue. She notes mild left ankle swelling. No dizziness or syncope. She does have some HA. She has no history of CAD or CHF. She does have HTN and mild hypercholesterolemia. No history of TIA or CVA. No history of bleeding. Home records indicate good HR control with range 69-104. BP has been well controlled.   Past Medical History  Diagnosis Date  . Anxiety state, unspecified   . Backache, unspecified   . Diverticulosis of colon (without mention of hemorrhage)   . Stricture and stenosis of esophagus   . Esophageal reflux   . Enthesopathy of hip region   . Diaphragmatic hernia without mention of obstruction or gangrene   . Pain in joint, pelvic region and thigh   . Pure hypercholesterolemia   . Unspecified essential hypertension   . Elevated blood pressure reading without  diagnosis of hypertension   . Pain in limb   . Other dysphagia   . Cramp of limb   . Chest pain, unspecified   . Other nonspecific abnormal serum enzyme levels   . Hepatitis, unspecified   . Disturbance of skin sensation   . Unspecified vitamin D deficiency   . Pain in joint, hand   . Other abnormal blood chemistry   . Pain in joint, ankle and foot   . Plantar fascial fibromatosis   . Unspecified hypothyroidism   . Asymptomatic varicose veins   . Allergic rhinitis due to pollen   . Myalgia and myositis, unspecified   . Pain in joint, lower leg   . Sciatica   . Reflux esophagitis   . Anxiety state, unspecified   . Depressive disorder, not elsewhere classified   . Other specified cardiac dysrhythmias(427.89)   . Other malaise and fatigue   . Insomnia, unspecified   . Carpal tunnel syndrome   . Spinal stenosis, unspecified region other than cervical   . Lumbago   . Symptomatic menopausal or female climacteric states   . Unspecified essential hypertension   . Headache(784.0)   . Other and unspecified hyperlipidemia   . Obesity, unspecified   . Diaphragmatic hernia without mention of obstruction or gangrene   . Peptic ulcer, unspecified site, unspecified as acute or chronic,  without mention of hemorrhage, perforation, or obstruction     Past Surgical History  Procedure Laterality Date  . Cholecystectomy  1989    DR BOWMAN  . Abdominal hysterectomy  1979  . Knee surgery  2003  . Breast biopsy      twice  . Excision of actinic keratosis      DR LUPTON   . O.s. cataract removed  08/03/1999    DR EPES   . O.d. cataract removed  2000    DR EPES  . Laser for cloudy cap left eye  03/2006    DR DIGBY  . Replacement total knee Right 04/2006    DR RENDALL  . Replacement total knee Left 04/2004    DR RENDALL  . Knee arthroscopy Right 06-26-13  . Total knee arthroplasty Right 07/04/2013    Procedure: RIGHT TOTAL KNEE ARTHROPLASTY;  Surgeon: Mcarthur Rossetti, MD;   Location: [facility];  Service: Orthopedics;  Laterality: Right;  . Knee closed reduction Right 10/23/2013    Procedure: CLOSED MANIPULATION RIGHT KNEE;  Surgeon: Mcarthur Rossetti, MD;  Location: [place];  Service: Orthopedics;  Laterality: Right;  . Colonoscopy  1988    Normal      Current Outpatient Prescriptions  Medication Sig Dispense Refill  . acetaminophen (TYLENOL) 500 MG tablet Take 500 mg by mouth as needed.    Marland Kitchen amoxicillin (AMOXIL) 500 MG capsule FOR DENTAL USED ONLY    . apixaban (ELIQUIS) 5 MG TABS tablet One twice daily for anticoagulation 60 tablet 5  . azelastine (OPTIVAR) 0.05 % ophthalmic solution Place 1 drop into both eyes 2 (two) times daily. As needed    . bimatoprost (LUMIGAN) 0.01 % SOLN Instill 1 drop into both eyes once daily in the evening    . Cholecalciferol (VITAMIN D) 2000 UNITS tablet Take 2,000 Units by mouth daily.    . dorzolamide (TRUSOPT) 2 % ophthalmic solution Place 1 drop into both eyes 3 (three) times daily.     Marland Kitchen glucosamine-chondroitin 500-400 MG tablet Take 2 tablets by mouth daily.     . methylcellulose (ARTIFICIAL TEARS) 1 % ophthalmic solution Place 1 drop into both eyes daily as needed (for dry eyes).     . metoprolol tartrate (LOPRESSOR) 25 MG tablet Take 1 tablet (25 mg total) by mouth 2 (two) times daily. 180 tablet 3  . pantoprazole (PROTONIX) 40 MG tablet One nightly to reduce stomach acid 30 tablet 5  . traZODone (DESYREL) 50 MG tablet Take 25 mg by mouth at bedtime.    . valsartan-hydrochlorothiazide (DIOVAN-HCT) 320-25 MG tablet TAKE 1 TABLET BY MOUTH EVERY DAY 30 tablet 5  . vitamin E 400 UNIT capsule Take 400 Units by mouth daily.     No current facility-administered medications for this visit.    Allergies:   Advil; Aleve; Aspirin; Atorvastatin; Celebrex; Diclofenac; Doxycycline; Metoclopramide hcl; Nsaids; Other; Pravastatin; and Timolol    Social History:  The patient  reports that she has never smoked. She has never used  smokeless tobacco. She reports that she does not drink alcohol or use illicit drugs.   Family History:  The patient's family history includes Cerebrovascular Accident in her mother; Heart disease in her father; Hypertension in her brother; Obesity in her daughter; Ovarian cancer in her mother; Uterine cancer in her mother.    ROS:  Please see the history of present illness.   Otherwise, review of systems are positive for none.   All other systems are reviewed and  negative.    PHYSICAL EXAM: VS:  BP 128/84 mmHg  Pulse 104  Ht 5\' 1"  (1.549 m)  Wt 82.918 kg (182 lb 12.8 oz)  BMI 34.56 kg/m2 , BMI Body mass index is 34.56 kg/(m^2). GEN: Well nourished, well developed, in no acute distress HEENT: normal Neck: no JVD, carotid bruits, or masses Cardiac: IRRR; no murmurs, rubs, or gallops,no edema  Respiratory:  clear to auscultation bilaterally, normal work of breathing GI: soft, nontender, nondistended, + BS MS: no deformity or atrophy Skin: warm and dry, no rash Neuro:  Strength and sensation are intact Psych: euthymic mood, full affect   EKG:  EKG is ordered today. The ekg ordered today demonstrates Atrial fibrillation with rate 104. Nonspecific ST-T wave abnormality.   Recent Labs: 07/26/2015: TSH 3.870 03/27/2016: ALT 16; BUN 17; Creatinine, Ser 0.70; Potassium 3.9; Sodium 141    Lipid Panel    Component Value Date/Time   CHOL 179 03/27/2016 0853   TRIG 190* 03/27/2016 0853   HDL 42 03/27/2016 0853   CHOLHDL 4.3 03/27/2016 0853   LDLCALC 99 03/27/2016 0853      Wt Readings from Last 3 Encounters:  04/10/16 82.918 kg (182 lb 12.8 oz)  11/30/15 83.008 kg (183 lb)  07/28/15 82.192 kg (181 lb 3.2 oz)      Other studies Reviewed: Additional studies/ records that were reviewed today include: Dr. Rolly Salter notes. Review of the above records demonstrates: see HPI   ASSESSMENT AND PLAN:  1.  Atrial fibrillation with RVR. Began March 9. She is symptomatic. Rate is now  controlled on metoprolol. Symptoms have improved and rate but she is still symptomatic. I suspect that she has sick sinus syndrome with long history of bradycardia. Other potential factors for her Afib include age and HTN. [place] Vasc score of 4. Recommend long term anticoagulation. Recommend continuing her current therapy. We discussed proceeding with DCCV. We could proceed now with TEE guidance or wait for 4 weeks of anticoagulation prior to proceeding with cardioversion. Since her symptoms are improved we have opted to wait. We will obtain an Echo and I will see back in 2 weeks. At that point we will schedule her for DCCV. She will be unable to take a beta blocker post cardioversion due to long history of bradycardia. If she should have recurrent AFib we will need to consider antiarrhythmic drug therapy but may have to consider for a pacemaker to avoid bradycardia.   2. Chest and epigastric pain radiating to jaw and right arm. This may be just related to Afib but I am concerned that this may be anginal and have recommended she have a Lexiscan myoview study.   3. HTN well controlled.  4. Mild hypercholesterolemia.    Current medicines are reviewed at length with the patient today.  The patient does not have concerns regarding medicines.  The following changes have been made:  no change  Labs/ tests ordered today include:  Orders Placed This Encounter  Procedures  . Myocardial Perfusion Imaging  . EKG 12-Lead  . ECHOCARDIOGRAM COMPLETE     Disposition:   FU with Dr. Martinique May 2.   Signed, Peter Martinique, MD  04/10/2016 7:44 PM    [facility] [address] [place], [place], [address] Phone [phone], Fax [phone]

## 2016-04-10 NOTE — Patient Instructions (Signed)
Continue your current therapy  We will schedule you for an Echocardiogram and a nuclear stress test  I will see you in 2 weeks

## 2016-04-10 NOTE — Telephone Encounter (Signed)
Received fax from [facility] [phone] and Eliquis 5mg  is APPROVED through 12/24/2016 Patient ID#: [id number]

## 2016-04-19 ENCOUNTER — Ambulatory Visit: Payer: Medicare Other | Admitting: Internal Medicine

## 2016-04-19 ENCOUNTER — Ambulatory Visit: Payer: Medicare Other | Admitting: Cardiology

## 2016-04-19 ENCOUNTER — Telehealth (HOSPITAL_COMMUNITY): Payer: Self-pay

## 2016-04-19 NOTE — Telephone Encounter (Signed)
Encounter complete. 

## 2016-04-21 ENCOUNTER — Ambulatory Visit (HOSPITAL_COMMUNITY)
Admission: RE | Admit: 2016-04-21 | Discharge: 2016-04-21 | Disposition: A | Discharge status: Home / Self Care | Payer: Medicare Other | Source: Ambulatory Visit | Attending: Cardiovascular Disease | Admitting: Cardiovascular Disease

## 2016-04-21 ENCOUNTER — Ambulatory Visit (HOSPITAL_BASED_OUTPATIENT_CLINIC_OR_DEPARTMENT_OTHER)
Admission: RE | Admit: 2016-04-21 | Discharge: 2016-04-21 | Disposition: A | Discharge status: Home / Self Care | Payer: Medicare Other | Source: Ambulatory Visit | Attending: Cardiovascular Disease | Admitting: Cardiovascular Disease

## 2016-04-21 DIAGNOSIS — R0609 Other forms of dyspnea: Secondary | ICD-10-CM | POA: Diagnosis not present

## 2016-04-21 DIAGNOSIS — R072 Precordial pain: Secondary | ICD-10-CM

## 2016-04-21 DIAGNOSIS — I10 Essential (primary) hypertension: Secondary | ICD-10-CM

## 2016-04-21 DIAGNOSIS — R5383 Other fatigue: Secondary | ICD-10-CM | POA: Insufficient documentation

## 2016-04-21 DIAGNOSIS — R079 Chest pain, unspecified: Secondary | ICD-10-CM | POA: Diagnosis not present

## 2016-04-21 DIAGNOSIS — Z6834 Body mass index (BMI) 34.0-34.9, adult: Secondary | ICD-10-CM | POA: Insufficient documentation

## 2016-04-21 DIAGNOSIS — E669 Obesity, unspecified: Secondary | ICD-10-CM

## 2016-04-21 DIAGNOSIS — Z8249 Family history of ischemic heart disease and other diseases of the circulatory system: Secondary | ICD-10-CM | POA: Insufficient documentation

## 2016-04-21 DIAGNOSIS — I481 Persistent atrial fibrillation: Secondary | ICD-10-CM

## 2016-04-21 DIAGNOSIS — E7800 Pure hypercholesterolemia, unspecified: Secondary | ICD-10-CM

## 2016-04-21 DIAGNOSIS — I4819 Other persistent atrial fibrillation: Secondary | ICD-10-CM

## 2016-04-21 LAB — MYOCARDIAL PERFUSION IMAGING
CHL CUP NUCLEAR SRS: 0
CHL CUP NUCLEAR SSS: 3
CSEPPHR: 110 {beats}/min
LVDIAVOL: 68 mL (ref 46–106)
LVSYSVOL: 34 mL
NUC STRESS TID: 0.82
Rest HR: 84 {beats}/min
SDS: 3

## 2016-04-21 LAB — ECHOCARDIOGRAM COMPLETE
HEIGHTINCHES: 61 in
Weight: 2912 oz

## 2016-04-21 MED ORDER — REGADENOSON 0.4 MG/5ML IV SOLN
0.4000 mg | Freq: Once | INTRAVENOUS | Status: AC
  Administered 2016-04-21: 0.4 mg via INTRAVENOUS

## 2016-04-21 MED ORDER — TECHNETIUM TC 99M SESTAMIBI GENERIC - CARDIOLITE
9.8000 | Freq: Once | INTRAVENOUS | Status: AC | PRN
  Administered 2016-04-21: 10 via INTRAVENOUS

## 2016-04-21 MED ORDER — AMINOPHYLLINE 25 MG/ML IV SOLN
75.0000 mg | Freq: Once | INTRAVENOUS | Status: AC
  Administered 2016-04-21: 75 mg via INTRAVENOUS

## 2016-04-21 MED ORDER — TECHNETIUM TC 99M SESTAMIBI GENERIC - CARDIOLITE
31.8000 | Freq: Once | INTRAVENOUS | Status: AC | PRN
  Administered 2016-04-21: 31.8 via INTRAVENOUS

## 2016-04-25 ENCOUNTER — Encounter: Payer: Self-pay | Admitting: Cardiology

## 2016-04-25 ENCOUNTER — Ambulatory Visit (INDEPENDENT_AMBULATORY_CARE_PROVIDER_SITE_OTHER): Payer: Medicare Other | Admitting: Cardiology

## 2016-04-25 ENCOUNTER — Other Ambulatory Visit: Payer: Self-pay | Admitting: Cardiology

## 2016-04-25 VITALS — BP 117/77 | HR 73 | Ht 62.5 in | Wt 183.2 lb

## 2016-04-25 DIAGNOSIS — E7800 Pure hypercholesterolemia, unspecified: Secondary | ICD-10-CM

## 2016-04-25 DIAGNOSIS — I481 Persistent atrial fibrillation: Secondary | ICD-10-CM

## 2016-04-25 DIAGNOSIS — I4891 Unspecified atrial fibrillation: Secondary | ICD-10-CM | POA: Diagnosis not present

## 2016-04-25 DIAGNOSIS — I4819 Other persistent atrial fibrillation: Secondary | ICD-10-CM

## 2016-04-25 DIAGNOSIS — I10 Essential (primary) hypertension: Secondary | ICD-10-CM

## 2016-04-25 LAB — CBC WITH DIFFERENTIAL/PLATELET
BASOS PCT: 1 %
Basophils Absolute: 53 cells/uL (ref 0–200)
EOS ABS: 106 {cells}/uL (ref 15–500)
Eosinophils Relative: 2 %
HEMATOCRIT: 38 % (ref 35.0–45.0)
HEMOGLOBIN: 12.3 g/dL (ref 11.7–15.5)
LYMPHS ABS: 2226 {cells}/uL (ref 850–3900)
Lymphocytes Relative: 42 %
MCH: 28.9 pg (ref 27.0–33.0)
MCHC: 32.4 g/dL (ref 32.0–36.0)
MCV: 89.4 fL (ref 80.0–100.0)
MONO ABS: 424 {cells}/uL (ref 200–950)
MPV: 10.5 fL (ref 7.5–12.5)
Monocytes Relative: 8 %
NEUTROS ABS: 2491 {cells}/uL (ref 1500–7800)
Neutrophils Relative %: 47 %
PLATELETS: 172 10*3/uL (ref 140–400)
RBC: 4.25 MIL/uL (ref 3.80–5.10)
RDW: 14.3 % (ref 11.0–15.0)
WBC: 5.3 10*3/uL (ref 3.8–10.8)

## 2016-04-25 LAB — BASIC METABOLIC PANEL
BUN: 20 mg/dL (ref 7–25)
CHLORIDE: 102 mmol/L (ref 98–110)
CO2: 28 mmol/L (ref 20–31)
Calcium: 8.7 mg/dL (ref 8.6–10.4)
Creat: 0.72 mg/dL (ref 0.60–0.88)
Glucose, Bld: 99 mg/dL (ref 65–99)
POTASSIUM: 3.6 mmol/L (ref 3.5–5.3)
Sodium: 136 mmol/L (ref 135–146)

## 2016-04-25 NOTE — Patient Instructions (Signed)
We will schedule you for a DC cardioversion  Continue your current therapy

## 2016-04-25 NOTE — Progress Notes (Signed)
Cardiology Office Note   Date:  04/25/2016   ID:  [name], DOB 08-22-1931, MRN [id number]  PCP:  Estill Dooms, MD  Cardiologist:   Nneoma Harral Martinique, MD   Chief Complaint  Patient presents with  . Follow-up    2 weeks--ECHO/lexiscan and Lower Extremity Venous Duplex  pt c/o chest fullness--has not been as bad, 50% better; SOB/lightheadedness on exertion and when going up steps; left ankle swelling/tightness; cramping in legs      History of Present Illness: [name] is a 82 y.o. female is seen for follow up Afib. She has a long history of marked sinus bradycardia. She was seen by Dr. Aundra Dubin in 2014. Echo at that time showed mild LAE otherwise normal. Holter showed mean HR 59 with lowest HR 43 and peak HR 109. She reports her HR is typically 48 but she was asymptomatic. On March 9 she noted an increased HR by BP monitor up to 153. At this time she felt marked indigestion from her waist to her neck. She felt her heart fluttering. She notes increased fatigue and is sleeping more.  She was seen by Dr. Nyoka Cowden on 03/29/16 and found to be in Afib with RVR. She was started on Eliquis and metoprolol. Since then she has felt better but still has has fatigue.  No dizziness or syncope. She does have some HA. She has no history of CAD or CHF. She does have HTN and mild hypercholesterolemia. No history of TIA or CVA. No history of bleeding. Home records indicate good HR control. BP has been well controlled.   Past Medical History  Diagnosis Date  . Anxiety state, unspecified   . Backache, unspecified   . Diverticulosis of colon (without mention of hemorrhage)   . Stricture and stenosis of esophagus   . Esophageal reflux   . Enthesopathy of hip region   . Diaphragmatic hernia without mention of obstruction or gangrene   . Pain in joint, pelvic region and thigh   . Pure hypercholesterolemia   . Unspecified essential hypertension   . Elevated blood pressure reading without diagnosis of  hypertension   . Pain in limb   . Other dysphagia   . Cramp of limb   . Chest pain, unspecified   . Other nonspecific abnormal serum enzyme levels   . Hepatitis, unspecified   . Disturbance of skin sensation   . Unspecified vitamin D deficiency   . Pain in joint, hand   . Other abnormal blood chemistry   . Pain in joint, ankle and foot   . Plantar fascial fibromatosis   . Unspecified hypothyroidism   . Asymptomatic varicose veins   . Allergic rhinitis due to pollen   . Myalgia and myositis, unspecified   . Pain in joint, lower leg   . Sciatica   . Reflux esophagitis   . Anxiety state, unspecified   . Depressive disorder, not elsewhere classified   . Other specified cardiac dysrhythmias(427.89)   . Other malaise and fatigue   . Insomnia, unspecified   . Carpal tunnel syndrome   . Spinal stenosis, unspecified region other than cervical   . Lumbago   . Symptomatic menopausal or female climacteric states   . Unspecified essential hypertension   . Headache(784.0)   . Other and unspecified hyperlipidemia   . Obesity, unspecified   . Diaphragmatic hernia without mention of obstruction or gangrene   . Peptic ulcer, unspecified site, unspecified as acute or chronic, without mention of  hemorrhage, perforation, or obstruction     Past Surgical History  Procedure Laterality Date  . Cholecystectomy  1989    DR BOWMAN  . Abdominal hysterectomy  1979  . Knee surgery  2003  . Breast biopsy      twice  . Excision of actinic keratosis      DR LUPTON   . O.s. cataract removed  08/03/1999    DR EPES   . O.d. cataract removed  2000    DR EPES  . Laser for cloudy cap left eye  03/2006    DR DIGBY  . Replacement total knee Right 04/2006    DR RENDALL  . Replacement total knee Left 04/2004    DR RENDALL  . Knee arthroscopy Right 06-26-13  . Total knee arthroplasty Right 07/04/2013    Procedure: RIGHT TOTAL KNEE ARTHROPLASTY;  Surgeon: Mcarthur Rossetti, MD;  Location: [facility];   Service: Orthopedics;  Laterality: Right;  . Knee closed reduction Right 10/23/2013    Procedure: CLOSED MANIPULATION RIGHT KNEE;  Surgeon: Mcarthur Rossetti, MD;  Location: [place];  Service: Orthopedics;  Laterality: Right;  . Colonoscopy  1988    Normal      Current Outpatient Prescriptions  Medication Sig Dispense Refill  . acetaminophen (TYLENOL) 500 MG tablet Take 500 mg by mouth as needed.    Marland Kitchen apixaban (ELIQUIS) 5 MG TABS tablet One twice daily for anticoagulation 60 tablet 5  . azelastine (OPTIVAR) 0.05 % ophthalmic solution Place 1 drop into both eyes 2 (two) times daily. As needed    . bimatoprost (LUMIGAN) 0.01 % SOLN Instill 1 drop into both eyes once daily in the evening    . BIOTIN 5000 PO Take 1 capsule by mouth daily.    . Cholecalciferol (VITAMIN D) 2000 UNITS tablet Take 2,000 Units by mouth daily.    . dorzolamide (TRUSOPT) 2 % ophthalmic solution Place 1 drop into both eyes 3 (three) times daily.     Marland Kitchen glucosamine-chondroitin 500-400 MG tablet Take 2 tablets by mouth daily.     Marland Kitchen KETOPROFEN EX Apply 1 application topically every evening. 20% cream; PRN for pain.    . methylcellulose (ARTIFICIAL TEARS) 1 % ophthalmic solution Place 1 drop into both eyes daily as needed (for dry eyes).     . metoprolol tartrate (LOPRESSOR) 25 MG tablet Take 1 tablet (25 mg total) by mouth 2 (two) times daily. 180 tablet 3  . pantoprazole (PROTONIX) 40 MG tablet One nightly to reduce stomach acid 30 tablet 5  . traZODone (DESYREL) 50 MG tablet Take 25 mg by mouth at bedtime.    . valsartan-hydrochlorothiazide (DIOVAN-HCT) 320-25 MG tablet TAKE 1 TABLET BY MOUTH EVERY DAY 30 tablet 5  . vitamin E 400 UNIT capsule Take 400 Units by mouth daily.     No current facility-administered medications for this visit.    Allergies:   Advil; Aleve; Aspirin; Atorvastatin; Celebrex; Diclofenac; Doxycycline; Metoclopramide hcl; Nsaids; Other; Pravastatin; and Timolol    Social History:  The  patient  reports that she has never smoked. She has never used smokeless tobacco. She reports that she does not drink alcohol or use illicit drugs.   Family History:  The patient's family history includes Cerebrovascular Accident in her mother; Heart disease in her father; Hypertension in her brother; Obesity in her daughter; Ovarian cancer in her mother; Uterine cancer in her mother.    ROS:  Please see the history of present illness.   Otherwise, review  of systems are positive for none.   All other systems are reviewed and negative.    PHYSICAL EXAM: VS:  BP 117/77 mmHg  Pulse 73  Ht 5' 2.5" (1.588 m)  Wt 83.099 kg (183 lb 3.2 oz)  BMI 32.95 kg/m2 , BMI Body mass index is 32.95 kg/(m^2). GEN: Well nourished, well developed, in no acute distress HEENT: normal Neck: no JVD, carotid bruits, or masses Cardiac: IRRR; no murmurs, rubs, or gallops,no edema  Respiratory:  clear to auscultation bilaterally, normal work of breathing GI: soft, nontender, nondistended, + BS MS: no deformity or atrophy Skin: warm and dry, no rash Neuro:  Strength and sensation are intact Psych: euthymic mood, full affect   EKG:  EKG is not ordered today.    Recent Labs: 07/26/2015: TSH 3.870 03/27/2016: ALT 16; BUN 17; Creatinine, Ser 0.70; Potassium 3.9; Sodium 141    Lipid Panel    Component Value Date/Time   CHOL 179 03/27/2016 0853   TRIG 190* 03/27/2016 0853   HDL 42 03/27/2016 0853   CHOLHDL 4.3 03/27/2016 0853   LDLCALC 99 03/27/2016 0853      Wt Readings from Last 3 Encounters:  04/25/16 83.099 kg (183 lb 3.2 oz)  04/21/16 82.555 kg (182 lb)  04/10/16 82.918 kg (182 lb 12.8 oz)      Other studies Reviewed: Additional studies/ records that were reviewed today include: recent Echo and Myoview Review of the above records demonstrates:   Study Highlights     The left ventricular ejection fraction is mildly decreased (45-54%).  Nuclear stress EF: 51%.  There was no ST segment  deviation noted during stress.  The study is normal.  This is a low risk study.  Low risk stress nuclear study with normal perfusion and borderline left ventricular global systolic function.  Consider alternative imaging for confirmation of LVEF, which may be underestimated on current study,    Study Conclusions  - Left ventricle: The cavity size was normal. Wall thickness was  normal. Systolic function was normal. The estimated ejection  fraction was in the range of 55% to 60%.   ASSESSMENT AND PLAN:  1.  Atrial fibrillation with RVR. Began March 9. She is symptomatic. Rate is now controlled on metoprolol. Symptoms have improved  but she is still symptomatic. I suspect that she has sick sinus syndrome with long history of bradycardia. Other potential factors for her Afib include age and HTN. [place] Vasc score of 4. Recommend long term anticoagulation. Echo and Myoview studies were unremarkable. Recommend proceeding with DCCV now. Will schedule this in am. Will hold metoprolol prior to procedure due to history of bradycardia. Continue Eliquis.  2. HTN well controlled.  3. Mild hypercholesterolemia.    Current medicines are reviewed at length with the patient today.  The patient does not have concerns regarding medicines.  The following changes have been made:  no change  Labs/ tests ordered today include:   Orders Placed This Encounter  Procedures  . Basic metabolic panel  . CBC w/Diff/Platelet  . INR/PT     Disposition:   DCCV tomorrow. Will arrange followup post procedure.   Signed, Latalia Etzler Martinique, MD  04/25/2016 1:27 PM    [facility] [address], [place], [place], [address] Phone [phone], Fax [phone]

## 2016-04-26 ENCOUNTER — Encounter (HOSPITAL_COMMUNITY)
Admission: RE | Disposition: A | Discharge status: Home / Self Care | Payer: Self-pay | Source: Ambulatory Visit | Attending: Internal Medicine

## 2016-04-26 ENCOUNTER — Encounter (HOSPITAL_COMMUNITY): Payer: Self-pay | Admitting: Internal Medicine

## 2016-04-26 ENCOUNTER — Ambulatory Visit (HOSPITAL_COMMUNITY): Payer: Medicare Other | Admitting: Anesthesiology

## 2016-04-26 ENCOUNTER — Ambulatory Visit: Payer: Medicare Other | Admitting: Internal Medicine

## 2016-04-26 ENCOUNTER — Ambulatory Visit (HOSPITAL_COMMUNITY)
Admission: RE | Admit: 2016-04-26 | Discharge: 2016-04-26 | Disposition: A | Discharge status: Home / Self Care | Payer: Medicare Other | Source: Ambulatory Visit | Attending: Internal Medicine | Admitting: Internal Medicine

## 2016-04-26 DIAGNOSIS — Z7901 Long term (current) use of anticoagulants: Secondary | ICD-10-CM | POA: Insufficient documentation

## 2016-04-26 DIAGNOSIS — I10 Essential (primary) hypertension: Secondary | ICD-10-CM | POA: Diagnosis not present

## 2016-04-26 DIAGNOSIS — E669 Obesity, unspecified: Secondary | ICD-10-CM | POA: Insufficient documentation

## 2016-04-26 DIAGNOSIS — E7800 Pure hypercholesterolemia, unspecified: Secondary | ICD-10-CM | POA: Diagnosis not present

## 2016-04-26 DIAGNOSIS — I484 Atypical atrial flutter: Secondary | ICD-10-CM | POA: Diagnosis not present

## 2016-04-26 DIAGNOSIS — Z79899 Other long term (current) drug therapy: Secondary | ICD-10-CM | POA: Insufficient documentation

## 2016-04-26 DIAGNOSIS — I4891 Unspecified atrial fibrillation: Secondary | ICD-10-CM | POA: Diagnosis not present

## 2016-04-26 DIAGNOSIS — K219 Gastro-esophageal reflux disease without esophagitis: Secondary | ICD-10-CM | POA: Insufficient documentation

## 2016-04-26 DIAGNOSIS — F329 Major depressive disorder, single episode, unspecified: Secondary | ICD-10-CM | POA: Diagnosis not present

## 2016-04-26 DIAGNOSIS — Z96653 Presence of artificial knee joint, bilateral: Secondary | ICD-10-CM | POA: Diagnosis not present

## 2016-04-26 DIAGNOSIS — I4892 Unspecified atrial flutter: Secondary | ICD-10-CM | POA: Diagnosis not present

## 2016-04-26 HISTORY — PX: CARDIOVERSION: SHX1299

## 2016-04-26 LAB — POCT I-STAT, CHEM 8
BUN: 19 mg/dL (ref 6–20)
CHLORIDE: 104 mmol/L (ref 101–111)
CREATININE: 0.7 mg/dL (ref 0.44–1.00)
Calcium, Ion: 1.18 mmol/L (ref 1.13–1.30)
GLUCOSE: 95 mg/dL (ref 65–99)
HCT: 42 % (ref 36.0–46.0)
HEMOGLOBIN: 14.3 g/dL (ref 12.0–15.0)
POTASSIUM: 3.6 mmol/L (ref 3.5–5.1)
Sodium: 142 mmol/L (ref 135–145)
TCO2: 25 mmol/L (ref 0–100)

## 2016-04-26 LAB — PROTIME-INR
INR: 1.28 (ref <1.50)
Prothrombin Time: 16.2 seconds — ABNORMAL HIGH (ref 11.6–15.2)

## 2016-04-26 SURGERY — CARDIOVERSION
Anesthesia: Monitor Anesthesia Care

## 2016-04-26 MED ORDER — LIDOCAINE HCL (CARDIAC) 20 MG/ML IV SOLN
INTRAVENOUS | Status: DC | PRN
  Administered 2016-04-26: 60 mg via INTRAVENOUS

## 2016-04-26 MED ORDER — SODIUM CHLORIDE 0.9 % IV SOLN: 250.0000 mL | INTRAVENOUS | Status: DC

## 2016-04-26 MED ORDER — PROPOFOL 10 MG/ML IV BOLUS
INTRAVENOUS | Status: DC | PRN
  Administered 2016-04-26: 30 mg via INTRAVENOUS
  Administered 2016-04-26: 50 mg via INTRAVENOUS
  Administered 2016-04-26: 20 mg via INTRAVENOUS

## 2016-04-26 MED ORDER — SODIUM CHLORIDE 0.9% FLUSH: 3.0000 mL | INTRAVENOUS | Status: DC | PRN

## 2016-04-26 MED ORDER — SODIUM CHLORIDE 0.9% FLUSH: 3.0000 mL | Freq: Two times a day (BID) | INTRAVENOUS | Status: DC

## 2016-04-26 MED ORDER — SODIUM CHLORIDE 0.9 % IV SOLN
INTRAVENOUS | Status: DC
Start: 2016-04-26 — End: 2016-04-26
  Administered 2016-04-26: 500 mL via INTRAVENOUS

## 2016-04-26 NOTE — Transfer of Care (Signed)
Immediate Anesthesia Transfer of Care Note  Patient: [name]  Procedure(s) Performed: Procedure(s): CARDIOVERSION (N/A)  Patient Location: Endoscopy Unit  Anesthesia Type:MAC  Level of Consciousness: awake, alert  and patient cooperative  Airway & Oxygen Therapy: Patient Spontanous Breathing  Post-op Assessment: Report given to RN and Post -op Vital signs reviewed and stable  Post vital signs: Reviewed and stable  Last Vitals:  Filed Vitals:   04/26/16 1011 04/26/16 1012  BP:    Pulse: 107 108  Temp:    Resp: 22 19    Last Pain:  Filed Vitals:   04/26/16 0857  PainSc: 2          Complications: No apparent anesthesia complications

## 2016-04-26 NOTE — H&P (Signed)
     INTERVAL PROCEDURE H&P  History and Physical Interval Note:  04/26/2016 9:26 AM  [name] has presented today for their planned procedure. The various methods of treatment have been discussed with the patient and family. After consideration of risks, benefits and other options for treatment, the patient has consented to the procedure.  The patients' outpatient history has been reviewed, patient examined, and no change in status from most recent office note within the past 30 days. I have reviewed the patients' chart and labs and will proceed as planned. Questions were answered to the patient's satisfaction.   Pixie Casino, MD, [facility] Attending Cardiologist [place] Larkin 04/26/2016, 9:26 AM

## 2016-04-26 NOTE — CV Procedure (Signed)
    CARDIOVERSION NOTE  Procedure: Electrical Cardioversion Indications:  Atrial Flutter  Procedure Details:  Consent: Risks of procedure as well as the alternatives and risks of each were explained to the (patient/caregiver).  Consent for procedure obtained.  Time Out: Verified patient identification, verified procedure, site/side was marked, verified correct patient position, special equipment/implants available, medications/allergies/relevent history reviewed, required imaging and test results available.  Performed  Patient placed on cardiac monitor, pulse oximetry, supplemental oxygen as necessary.  Sedation given: Propofol per anesthesia Pacer pads placed anterior and posterior chest.  Cardioverted 2 time(s).  Cardioverted at 120J and 150 biphasic  Impression: Findings: Post procedure EKG shows: Atrial Flutter Complications: None Patient did tolerate procedure well.  Plan: 1. Unsuccessful DCCV with 2 consecutive shocks - rhythm was atrial flutter. Shocks caused long-pauses up to 5 seconds, followed by a few junctional beats and then back to atrial flutter. Concern for underlying sinus node dysfunction.  2. Resume home medications. EP consultation is recommended. 3. D/W Dr. Martinique and Dr. Curt Bears.  Time Spent Directly with the Patient:  30 minutes   Pixie Casino, MD, [facility] Attending Cardiologist [place] 04/26/2016, 10:32 AM

## 2016-04-26 NOTE — Anesthesia Preprocedure Evaluation (Addendum)
Anesthesia Evaluation  Patient identified by MRN, date of birth, ID band Patient awake    Reviewed: Allergy & Precautions, NPO status , Patient's Chart, lab work & pertinent test results, reviewed documented beta blocker date and time   Airway Mallampati: II  TM Distance: >3 FB Neck ROM: Full    Dental  (+) Teeth Intact, Dental Advisory Given, Caps,    Pulmonary neg pulmonary ROS,    Pulmonary exam normal breath sounds clear to auscultation       Cardiovascular hypertension, Pt. on home beta blockers and Pt. on medications + dysrhythmias Atrial Fibrillation  Rhythm:Irregular Rate:Abnormal     Neuro/Psych  Headaches, PSYCHIATRIC DISORDERS Anxiety Depression  Neuromuscular disease    GI/Hepatic Neg liver ROS, PUD, GERD  ,  Endo/Other  Hypothyroidism Obesity   Renal/GU negative Renal ROS     Musculoskeletal negative musculoskeletal ROS (+)   Abdominal   Peds  Hematology negative hematology ROS (+) Blood dyscrasia (Eliquis), ,   Anesthesia Other Findings Day of surgery medications reviewed with the patient.  Reproductive/Obstetrics                            Anesthesia Physical Anesthesia Plan  ASA: III  Anesthesia Plan: MAC   Post-op Pain Management:    Induction: Intravenous  Airway Management Planned: Mask  Additional Equipment:   Intra-op Plan:   Post-operative Plan:   Informed Consent: I have reviewed the patients History and Physical, chart, labs and discussed the procedure including the risks, benefits and alternatives for the proposed anesthesia with the patient or authorized representative who has indicated his/her understanding and acceptance.   Dental advisory given  Plan Discussed with: CRNA and Anesthesiologist  Anesthesia Plan Comments: (Discussed risks/benefits/alternatives to MAC sedation including need for ventilatory support, hypotension, need for conversion to  general anesthesia.  All patient questions answered.  Patient/guardian wishes to proceed.)        Anesthesia Quick Evaluation

## 2016-04-26 NOTE — Discharge Instructions (Signed)
Electrical Cardioversion, Care After °Refer to this sheet in the next few weeks. These instructions provide you with information on caring for yourself after your procedure. Your health care provider may also give you more specific instructions. Your treatment has been planned according to current medical practices, but problems sometimes occur. Call your health care provider if you have any problems or questions after your procedure. °WHAT TO EXPECT AFTER THE PROCEDURE °After your procedure, it is typical to have the following sensations: °· Some redness on the skin where the shocks were delivered. If this is tender, a sunburn lotion or hydrocortisone cream may help. °· Possible return of an abnormal heart rhythm within hours or days after the procedure. °HOME CARE INSTRUCTIONS °· Take medicines only as directed by your health care provider. Be sure you understand how and when to take your medicine. °· Learn how to feel your pulse and check it often. °· Limit your activity for 48 hours after the procedure or as directed by your health care provider. °· Avoid or minimize caffeine and other stimulants as directed by your health care provider. °SEEK MEDICAL CARE IF: °· You feel like your heart is beating too fast or your pulse is not regular. °· You have any questions about your medicines. °· You have bleeding that will not stop. °SEEK IMMEDIATE MEDICAL CARE IF: °· You are dizzy or feel faint. °· It is hard to breathe or you feel short of breath. °· There is a change in discomfort in your chest. °· Your speech is slurred or you have trouble moving an arm or leg on one side of your body. °· You get a serious muscle cramp that does not go away. °· Your fingers or toes turn cold or blue. °  °This information is not intended to replace advice given to you by your health care provider. Make sure you discuss any questions you have with your health care provider. °  °Document Released: 10/01/2013 Document Revised: 01/01/2015  Document Reviewed: 10/01/2013 °Elsevier Interactive Patient Education ©2016 [employer] ° °

## 2016-04-27 ENCOUNTER — Encounter (HOSPITAL_COMMUNITY): Payer: Self-pay | Admitting: Internal Medicine

## 2016-04-27 DIAGNOSIS — H04123 Dry eye syndrome of bilateral lacrimal glands: Secondary | ICD-10-CM | POA: Diagnosis not present

## 2016-04-27 DIAGNOSIS — H40013 Open angle with borderline findings, low risk, bilateral: Secondary | ICD-10-CM | POA: Diagnosis not present

## 2016-04-27 NOTE — Anesthesia Postprocedure Evaluation (Signed)
Anesthesia Post Note  Patient: [name]  Procedure(s) Performed: Procedure(s) (LRB): CARDIOVERSION (N/A)  Patient location during evaluation: Endoscopy Anesthesia Type: MAC Level of consciousness: awake and alert Pain management: pain level controlled Vital Signs Assessment: post-procedure vital signs reviewed and stable Respiratory status: spontaneous breathing, nonlabored ventilation and respiratory function stable Cardiovascular status: blood pressure returned to baseline and stable Postop Assessment: no signs of nausea or vomiting Anesthetic complications: no    Last Vitals:  Filed Vitals:   04/26/16 1040 04/26/16 1045  BP: 159/108 144/95  Pulse: 111 111  Temp:    Resp: 15 16    Last Pain:  Filed Vitals:   04/26/16 1046  PainSc: 2                  Catalina Gravel

## 2016-04-28 ENCOUNTER — Telehealth: Payer: Self-pay

## 2016-04-28 NOTE — Telephone Encounter (Signed)
Spoke to patient about recent lab results.She stated she had a episode this morning while standing at sink she saw black,did not pass out.Stated her heart was beating fast,episode lasted appox 2 min or less.Stated she feels good at present.She has appointment with [facility] 05/23/16.Dr.Camnitz's scheduler called left message to put pt on cancellation list.Pt advised if she has anymore episodes to call back.Advised to go ER if needed.

## 2016-05-08 NOTE — Progress Notes (Signed)
Electrophysiology Office Note   Date:  05/09/2016   ID:  [name], DOB 16-Mar-1931, MRN [id number]  PCP:  Veronica Dooms, MD  Cardiologist:  [name] Primary Electrophysiologist:  Veronica Haw, MD    Chief Complaint  Patient presents with  . New Patient (Initial Visit)    afib ablation eval     History of Present Illness: [name] is a 82 y.o. female who presents today for electrophysiology evaluation.   She has a history of atrial fibrillation, sinus bradycardia, and hypertension.  She initially went into AF on 03/02/16.  She was started on metoprolol and Eliquis.  She had cardioversion attempt on 5/3 which were unsuccessful.  She had long pauses post cardioversion with a few junctional beats and then return back to atrial flutter.  On 5/3, she had an episode where she saw black for 2 seconds but not other symptoms.  Since being started on the metoprolol, and she has felt much better. She says that her energy has improved and her shortness of breath has also improved. They are unfortunately not completely back to normal. She does say that she is going on a trip with her husband to [facility] tomorrow and [name] come back at the end of May.   Today, she denies symptoms of palpitations, chest pain, orthopnea, PND, lower extremity edema, claudication, dizziness, presyncope, syncope, bleeding, or neurologic sequela. The patient is tolerating medications without difficulties and is otherwise without complaint today.    Past Medical History  Diagnosis Date  . Anxiety state, unspecified   . Backache, unspecified   . Diverticulosis of colon (without mention of hemorrhage)   . Stricture and stenosis of esophagus   . Esophageal reflux   . Enthesopathy of hip region   . Diaphragmatic hernia without mention of obstruction or gangrene   . Pain in joint, pelvic region and thigh   . Pure hypercholesterolemia   . Unspecified essential hypertension   . Elevated blood pressure  reading without diagnosis of hypertension   . Pain in limb   . Other dysphagia   . Cramp of limb   . Chest pain, unspecified   . Other nonspecific abnormal serum enzyme levels   . Hepatitis, unspecified   . Disturbance of skin sensation   . Unspecified vitamin D deficiency   . Pain in joint, hand   . Other abnormal blood chemistry   . Pain in joint, ankle and foot   . Plantar fascial fibromatosis   . Unspecified hypothyroidism   . Asymptomatic varicose veins   . Allergic rhinitis due to pollen   . Myalgia and myositis, unspecified   . Pain in joint, lower leg   . Sciatica   . Reflux esophagitis   . Anxiety state, unspecified   . Depressive disorder, not elsewhere classified   . Other specified cardiac dysrhythmias(427.89)   . Other malaise and fatigue   . Insomnia, unspecified   . Carpal tunnel syndrome   . Spinal stenosis, unspecified region other than cervical   . Lumbago   . Symptomatic menopausal or female climacteric states   . Unspecified essential hypertension   . Headache(784.0)   . Other and unspecified hyperlipidemia   . Obesity, unspecified   . Diaphragmatic hernia without mention of obstruction or gangrene   . Peptic ulcer, unspecified site, unspecified as acute or chronic, without mention of hemorrhage, perforation, or obstruction    Past Surgical History  Procedure Laterality Date  . Cholecystectomy  1989  DR Deon Pilling  . Abdominal hysterectomy  1979  . Knee surgery  2003  . Breast biopsy      twice  . Excision of actinic keratosis      DR LUPTON   . O.s. cataract removed  08/03/1999    DR EPES   . O.d. cataract removed  2000    DR EPES  . Laser for cloudy cap left eye  03/2006    DR DIGBY  . Replacement total knee Right 04/2006    DR RENDALL  . Replacement total knee Left 04/2004    DR RENDALL  . Knee arthroscopy Right 06-26-13  . Total knee arthroplasty Right 07/04/2013    Procedure: RIGHT TOTAL KNEE ARTHROPLASTY;  Surgeon: Mcarthur Rossetti,  MD;  Location: [facility];  Service: Orthopedics;  Laterality: Right;  . Knee closed reduction Right 10/23/2013    Procedure: CLOSED MANIPULATION RIGHT KNEE;  Surgeon: Mcarthur Rossetti, MD;  Location: [place];  Service: Orthopedics;  Laterality: Right;  . Colonoscopy  1988    Normal   . Cardioversion N/A 04/26/2016    Procedure: CARDIOVERSION;  Surgeon: Pixie Casino, MD;  Location: [facility];  Service: Cardiovascular;  Laterality: N/A;     Current Outpatient Prescriptions  Medication Sig Dispense Refill  . acetaminophen (TYLENOL) 500 MG tablet Take 500 mg by mouth as needed.    Marland Kitchen apixaban (ELIQUIS) 5 MG TABS tablet One twice daily for anticoagulation 60 tablet 5  . bimatoprost (LUMIGAN) 0.01 % SOLN Instill 1 drop into both eyes once daily in the evening    . BIOTIN 5000 PO Take 1 capsule by mouth daily.    . Cholecalciferol (VITAMIN D) 2000 UNITS tablet Take 2,000 Units by mouth daily.    . dorzolamide (TRUSOPT) 2 % ophthalmic solution Place 1 drop into both eyes 3 (three) times daily.     Marland Kitchen glucosamine-chondroitin 500-400 MG tablet Take 2 tablets by mouth daily.     Marland Kitchen KETOPROFEN EX Apply 1 application topically every evening. 20% cream; PRN for pain.    . methylcellulose (ARTIFICIAL TEARS) 1 % ophthalmic solution Place 1 drop into both eyes daily as needed (for dry eyes).     . metoprolol tartrate (LOPRESSOR) 25 MG tablet Take 1 tablet (25 mg total) by mouth 2 (two) times daily. 180 tablet 3  . pantoprazole (PROTONIX) 40 MG tablet One nightly to reduce stomach acid 30 tablet 5  . traZODone (DESYREL) 50 MG tablet Take 25 mg by mouth at bedtime.    . valsartan-hydrochlorothiazide (DIOVAN-HCT) 320-25 MG tablet TAKE 1 TABLET BY MOUTH EVERY DAY 30 tablet 5  . vitamin E 400 UNIT capsule Take 400 Units by mouth daily.     No current facility-administered medications for this visit.    Allergies:   Advil; Aleve; Aspirin; Atorvastatin; Celebrex; Diclofenac; Doxycycline; Metoclopramide hcl;  Nsaids; Other; Pravastatin; and Timolol   Social History:  The patient  reports that she has never smoked. She has never used smokeless tobacco. She reports that she does not drink alcohol or use illicit drugs.   Family History:  The patient's family history includes Cerebrovascular Accident in her mother; Heart disease in her father; Hypertension in her brother; Obesity in her daughter; Ovarian cancer in her mother; Uterine cancer in her mother.    ROS:  Please see the history of present illness.   Otherwise, review of systems is positive for chest pressure, DOE, palpitations, back pain, dizziness.   All other systems are reviewed and  negative.    PHYSICAL EXAM: VS:  BP 126/82 mmHg  Pulse 74  Ht 5\' 3"  (1.6 m)  Wt 186 lb 3.2 oz (84.46 kg)  BMI 32.99 kg/m2 , BMI Body mass index is 32.99 kg/(m^2). GEN: Well nourished, well developed, in no acute distress HEENT: normal Neck: no JVD, carotid bruits, or masses Cardiac: RRR; no murmurs, rubs, or gallops,no edema  Respiratory:  clear to auscultation bilaterally, normal work of breathing GI: soft, nontender, nondistended, + BS MS: no deformity or atrophy Skin: warm and dry Neuro:  Strength and sensation are intact Psych: euthymic mood, full affect  EKG:  EKG is ordered today. The ekg ordered today shows atrial flutter with 3:1 block, rate 74  Recent Labs: 07/26/2015: TSH 3.870 03/27/2016: ALT 16 04/25/2016: Platelets 172 04/26/2016: BUN 19; Creatinine, Ser 0.70; Hemoglobin 14.3; Potassium 3.6; Sodium 142    Lipid Panel     Component Value Date/Time   CHOL 179 03/27/2016 0853   TRIG 190* 03/27/2016 0853   HDL 42 03/27/2016 0853   CHOLHDL 4.3 03/27/2016 0853   LDLCALC 99 03/27/2016 0853     Wt Readings from Last 3 Encounters:  05/09/16 186 lb 3.2 oz (84.46 kg)  04/26/16 183 lb (83.008 kg)  04/25/16 183 lb 3.2 oz (83.099 kg)      Other studies Reviewed: Additional studies/ records that were reviewed today include: SPECT 04/21/16,  TTE 04/21/16  Review of the above records today demonstrates:   The left ventricular ejection fraction is mildly decreased (45-54%).  Nuclear stress EF: 51%.  There was no ST segment deviation noted during stress.  The study is normal.  This is a low risk study.  - Left ventricle: The cavity size was normal. Wall thickness was  normal. Systolic function was normal. The estimated ejection  fraction was in the range of 55% to 60%. Left atrium: The atrium was normal in size.  ASSESSMENT AND PLAN:  1.  Atrial fibrillation/flutter: She isn't rate controlled atrial flutter today. She does say that she has had heart rates that have gone up into the 100s recently, but her symptoms are much improved with the metoprolol. I Jakyron Fabro start her today on flecainide 75 mg, which Gyasi Hazzard hopefully convert her to sinus rhythm. We Euriah Matlack get an EKG when she returns back from [facility] in 2 weeks. If she is not in sinus rhythm at that time, Kiyon Fidalgo arrange for her to have a repeat attempt at cardioversion. I did tell them that there is a possibility of changing medications, or ablation in the future.    Current medicines are reviewed at length with the patient today.   The patient does not have concerns regarding her medicines.  The following changes were made today:  Flecainide 75 mg  Labs/ tests ordered today include:  No orders of the defined types were placed in this encounter.     Disposition:   FU with Amilah Greenspan 3 months  Signed, Adi Seales Meredith Leeds, MD  05/09/2016 10:23 AM     [facility] [address] [place] [address] [phone] (office) [phone] (fax)

## 2016-05-09 ENCOUNTER — Ambulatory Visit (INDEPENDENT_AMBULATORY_CARE_PROVIDER_SITE_OTHER): Payer: Medicare Other | Admitting: Cardiology

## 2016-05-09 ENCOUNTER — Encounter: Payer: Self-pay | Admitting: Cardiology

## 2016-05-09 VITALS — BP 126/82 | HR 74 | Ht 63.0 in | Wt 186.2 lb

## 2016-05-09 DIAGNOSIS — Z79899 Other long term (current) drug therapy: Secondary | ICD-10-CM | POA: Diagnosis not present

## 2016-05-09 DIAGNOSIS — I484 Atypical atrial flutter: Secondary | ICD-10-CM

## 2016-05-09 MED ORDER — FLECAINIDE ACETATE 150 MG PO TABS: 75.0000 mg | ORAL_TABLET | Freq: Two times a day (BID) | ORAL | Status: DC

## 2016-05-09 NOTE — Patient Instructions (Signed)
Medication Instructions:  Your physician has recommended you make the following change in your medication:  1) START Flecainide 75 mg twice a day  If you need a refill on your cardiac medications before your next appointment, please call your pharmacy.  Labwork: None ordered  Testing/Procedures: None ordered  Follow-Up: Your physician recommends that you schedule a follow-up appointment when you get back from [place] in a couple of weeks for an EKG post Flecainide start.  Your physician recommends that you schedule a follow-up appointment in: 3 months with Dr. Curt Bears.  Thank you for choosing [facility]!!   Trinidad Curet, RN [phone]   Any Other Special Instructions Will Be Listed Below (If Applicable). Flecainide tablets What is this medicine? FLECAINIDE (FLEK a nide) is an antiarrhythmic drug. This medicine is used to prevent irregular heart rhythm. It can also slow down fast heartbeats called tachycardia. This medicine may be used for other purposes; ask your health care provider or pharmacist if you have questions. What should I tell my health care provider before I take this medicine? They need to know if you have any of these conditions: -abnormal levels of potassium in the blood -heart disease including heart rhythm and heart rate problems -kidney or liver disease -recent heart attack -an unusual or allergic reaction to flecainide, local anesthetics, other medicines, foods, dyes, or preservatives -pregnant or trying to get pregnant -breast-feeding How should I use this medicine? Take this medicine by mouth with a glass of water. Follow the directions on the prescription label. You can take this medicine with or without food. Take your doses at regular intervals. Do not take your medicine more often than directed. Do not stop taking this medicine suddenly. This may cause serious, heart-related side effects. If your doctor wants you to stop the medicine, the dose  may be slowly lowered over time to avoid any side effects. Talk to your pediatrician regarding the use of this medicine in children. While this drug may be prescribed for children as young as 1 year of age for selected conditions, precautions do apply. Overdosage: If you think you have taken too much of this medicine contact a poison control center or emergency room at once. NOTE: This medicine is only for you. Do not share this medicine with others. What if I miss a dose? If you miss a dose, take it as soon as you can. If it is almost time for your next dose, take only that dose. Do not take double or extra doses. What may interact with this medicine? Do not take this medicine with any of the following medications: -amoxapine -arsenic trioxide -certain antibiotics like clarithromycin, erythromycin, gatifloxacin, gemifloxacin, levofloxacin, moxifloxacin, sparfloxacin, or troleandomycin -certain antidepressants called tricyclic antidepressants like amitriptyline, imipramine, or nortriptyline -certain medicines to control heart rhythm like disopyramide, dofetilide, encainide, moricizine, procainamide, propafenone, and quinidine -cisapride -cyclobenzaprine -delavirdine -droperidol -haloperidol -hawthorn -imatinib -levomethadyl -maprotiline -medicines for malaria like chloroquine and halofantrine -pentamidine -phenothiazines like chlorpromazine, mesoridazine, prochlorperazine, thioridazine -pimozide -quinine -ranolazine -ritonavir -sertindole -ziprasidone This medicine may also interact with the following medications: -cimetidine -medicines for angina or high blood pressure -medicines to control heart rhythm like amiodarone and digoxin This list may not describe all possible interactions. Give your health care provider a list of all the medicines, herbs, non-prescription drugs, or dietary supplements you use. Also tell them if you smoke, drink alcohol, or use illegal drugs. Some items  may interact with your medicine. What should I watch for while using this medicine? Visit  your doctor or health care professional for regular checks on your progress. Because your condition and the use of this medicine carries some risk, it is a good idea to carry an identification card, necklace or bracelet with details of your condition, medications and doctor or health care professional. Check your blood pressure and pulse rate regularly. Ask your health care professional what your blood pressure and pulse rate should be, and when you should contact him or her. Your doctor or health care professional also may schedule regular blood tests and electrocardiograms to check your progress. You may get drowsy or dizzy. Do not drive, use machinery, or do anything that needs mental alertness until you know how this medicine affects you. Do not stand or sit up quickly, especially if you are an older patient. This reduces the risk of dizzy or fainting spells. Alcohol can make you more dizzy, increase flushing and rapid heartbeats. Avoid alcoholic drinks. What side effects may I notice from receiving this medicine? Side effects that you should report to your doctor or health care professional as soon as possible: -chest pain, continued irregular heartbeats -difficulty breathing -swelling of the legs or feet -trembling, shaking -unusually weak or tired Side effects that usually do not require medical attention (report to your doctor or health care professional if they continue or are bothersome): -blurred vision -constipation -headache -nausea, vomiting -stomach pain This list may not describe all possible side effects. Call your doctor for medical advice about side effects. You may report side effects to FDA at 1-800-FDA-1088. Where should I keep my medicine? Keep out of the reach of children. Store at room temperature between 15 and 30 degrees C (59 and 86 degrees F). Protect from light. Keep container  tightly closed. Throw away any unused medicine after the expiration date. NOTE: This sheet is a summary. It may not cover all possible information. If you have questions about this medicine, talk to your doctor, pharmacist, or health care provider.    2016, [employer]vier/[employer] Standard. (2008-04-15 16:46:09)

## 2016-05-18 ENCOUNTER — Ambulatory Visit: Payer: Medicare Other | Admitting: Physician Assistant

## 2016-05-23 ENCOUNTER — Ambulatory Visit: Payer: Medicare Other | Admitting: Cardiology

## 2016-05-24 ENCOUNTER — Ambulatory Visit: Payer: Medicare Other | Admitting: Physician Assistant

## 2016-05-25 ENCOUNTER — Ambulatory Visit (INDEPENDENT_AMBULATORY_CARE_PROVIDER_SITE_OTHER): Payer: Medicare Other

## 2016-05-25 VITALS — BP 122/60 | HR 56 | Wt 188.6 lb

## 2016-05-25 DIAGNOSIS — I484 Atypical atrial flutter: Secondary | ICD-10-CM

## 2016-05-25 NOTE — Progress Notes (Signed)
Pt in today for EKG, for a 75mg  Flecainide start two weeks ago. Pt stated that over the past two weeks she has experienced dizziness, weakness, fatigue, and SOB with exertion. Pt stated she checks her BP daily in the mornings and it is 150/50 with a HR of 53. Pt is worried as at time during the day her HR will drop in the 30's, as low as 33 and then goes back up to the 50's. Pt stated she has no exercise to go to the gym or exercise at home any longer, as she is so SOB.  Pt stated she has been very mindful of her diet and salt intake but she is also gaining weight over the past 6-8 weeks. On 03/29/16 she say her weight was 174lb and today her weight was 188 lb and this is worrying her that she is holding on to fluid. Pt concerns reviewed with Jens Som, PA/S. Caryl Comes, MD. Pt to STOP Flecanide and keep scheduled appointment with Dr. Martinique on 06/07/16.  Pt understands instructions, told that if symptoms get worse she needs to call 911 to be evaluated or our office. Pt verbalized understanding, no questions at this time.

## 2016-05-30 ENCOUNTER — Ambulatory Visit (INDEPENDENT_AMBULATORY_CARE_PROVIDER_SITE_OTHER): Payer: Medicare Other | Admitting: Internal Medicine

## 2016-05-30 ENCOUNTER — Encounter: Payer: Self-pay | Admitting: Internal Medicine

## 2016-05-30 VITALS — BP 132/80 | HR 51 | Temp 97.3°F | Ht 63.0 in | Wt 185.0 lb

## 2016-05-30 DIAGNOSIS — R609 Edema, unspecified: Secondary | ICD-10-CM

## 2016-05-30 DIAGNOSIS — I10 Essential (primary) hypertension: Secondary | ICD-10-CM

## 2016-05-30 DIAGNOSIS — E669 Obesity, unspecified: Secondary | ICD-10-CM

## 2016-05-30 DIAGNOSIS — G4700 Insomnia, unspecified: Secondary | ICD-10-CM

## 2016-05-30 DIAGNOSIS — M5442 Lumbago with sciatica, left side: Secondary | ICD-10-CM | POA: Diagnosis not present

## 2016-05-30 DIAGNOSIS — I481 Persistent atrial fibrillation: Secondary | ICD-10-CM

## 2016-05-30 DIAGNOSIS — K2100 Gastro-esophageal reflux disease with esophagitis, without bleeding: Secondary | ICD-10-CM

## 2016-05-30 DIAGNOSIS — I4819 Other persistent atrial fibrillation: Secondary | ICD-10-CM

## 2016-05-30 DIAGNOSIS — R739 Hyperglycemia, unspecified: Secondary | ICD-10-CM | POA: Diagnosis not present

## 2016-05-30 MED ORDER — FUROSEMIDE 20 MG PO TABS: ORAL_TABLET | ORAL | Status: DC

## 2016-05-30 NOTE — Progress Notes (Addendum)
Patient ID: [name], female   DOB: Apr 01, 1931, 82 y.o.   MRN: [id number]    Facility  [place]    Place of Service:   OFFICE    Allergies  Allergen Reactions  . Advil [Ibuprofen]     GI upset  . Aleve [Naproxen Sodium]     GI upset  . Aspirin Nausea And Vomiting    Takes baby aspirin qod without problems  . Atorvastatin     Leg cramps  . Celebrex [Celecoxib]     GI upset  . Diclofenac     GI upset  . Doxycycline     Headache  . Metoclopramide Hcl     REACTION: heart palps  . Nsaids Other (See Comments)    Tears my stomach up   . Other     Antihistamine - increase BP  . Pravastatin     Leg cramps  . Timolol     Dropped HR    Chief Complaint  Patient presents with  . Medical Management of Chronic Issues    3 week medication management blood pressure, A-Flutter.   Had Cardioversion 04/26/16 by Dr. Debara Pickett    HPI:  Last cardiology visit was 05/09/2016. Patient has had atrial fib/flutter. Rates were still in the 100s from time to time at that visit. She was being treated with metoprolol. Flecainide was added. Complained of side effects or dizziness and nausea. She is now off it.  Currently in NSR.  Retaining some fluid in ankles.  Medications: Patient's Medications  New Prescriptions   No medications on file  Previous Medications   ACETAMINOPHEN (TYLENOL) 500 MG TABLET    Take 500 mg by mouth as needed.   APIXABAN (ELIQUIS) 5 MG TABS TABLET    One twice daily for anticoagulation   BIMATOPROST (LUMIGAN) 0.01 % SOLN    Instill 1 drop into both eyes once daily in the evening   BIOTIN 5000 PO    Take 1 capsule by mouth daily.   CHOLECALCIFEROL (VITAMIN D) 2000 UNITS TABLET    Take 2,000 Units by mouth daily.   DORZOLAMIDE (TRUSOPT) 2 % OPHTHALMIC SOLUTION    Place 1 drop into both eyes 3 (three) times daily.    FLECAINIDE (TAMBOCOR) 150 MG TABLET       GLUCOSAMINE-CHONDROITIN 500-400 MG TABLET    Take 2 tablets by mouth daily.    KETOPROFEN EX    Apply 1  application topically every evening. 20% cream; PRN for pain.   METHYLCELLULOSE (ARTIFICIAL TEARS) 1 % OPHTHALMIC SOLUTION    Place 1 drop into both eyes daily as needed (for dry eyes).    METOPROLOL TARTRATE (LOPRESSOR) 25 MG TABLET    Take 1 tablet (25 mg total) by mouth 2 (two) times daily.   PANTOPRAZOLE (PROTONIX) 40 MG TABLET    One nightly to reduce stomach acid   TRAZODONE (DESYREL) 50 MG TABLET    Take 25 mg by mouth at bedtime.   VALSARTAN-HYDROCHLOROTHIAZIDE (DIOVAN-HCT) 320-25 MG TABLET    TAKE 1 TABLET BY MOUTH EVERY DAY   VITAMIN E 400 UNIT CAPSULE    Take 400 Units by mouth daily.  Modified Medications   No medications on file  Discontinued Medications   No medications on file    Review of Systems  Constitutional: Positive for activity change. Negative for fever, chills, diaphoresis, appetite change, fatigue and unexpected weight change.  HENT: Negative.   Eyes: Positive for discharge.       Corrective lenses.  Respiratory: Negative.   Cardiovascular: Positive for leg swelling. Negative for chest pain and palpitations.       Palpitations. Atrial fibrillation documented on EKG 03/29/16.  Gastrointestinal:       Frequent reflux and heartburn.  Endocrine:       History of elevations in glucose. Diet controlled.  Genitourinary: Positive for frequency.       Urinary leakage,  Musculoskeletal:       Chronic back pains. Right knee painful. Had surgery 07/04/13 by Dr. Ninfa Linden. Using tramadol. Having some left neck discomfort.  Skin: Negative.   Neurological: Positive for numbness. Negative for dizziness, tremors, seizures, syncope, facial asymmetry, speech difficulty, weakness, light-headedness and headaches.       Episodes of numbness in the right hand.  Hematological: Negative.   Psychiatric/Behavioral: Negative.        Some difficulty falling asleep. Using less Ambien now.    Filed Vitals:   05/30/16 1555  BP: 132/80  Pulse: 51  Temp: 97.3 F (36.3 C)  TempSrc: Oral   Height: 5' 3"  (1.6 m)  Weight: 185 lb (83.915 kg)  SpO2: 98%   Body mass index is 32.78 kg/(m^2). Filed Weights   05/30/16 1555  Weight: 185 lb (83.915 kg)     Physical Exam  Constitutional: She is oriented to person, place, and time. No distress.  Obese.  HENT:  Head: Normocephalic and atraumatic.  Right Ear: External ear normal.  Left Ear: External ear normal.  Eyes: Conjunctivae and EOM are normal. Pupils are equal, round, and reactive to light. Left eye exhibits no discharge. No scleral icterus.  Neck: Normal range of motion. Neck supple. No JVD present. No tracheal deviation present. No thyromegaly present.  Cardiovascular: Intact distal pulses.  Exam reveals no gallop and no friction rub.   No murmur heard. NSR with rate 51  Pulmonary/Chest: Breath sounds normal. No respiratory distress. She has no wheezes. She has no rales. She exhibits no tenderness.  Abdominal: Soft. Bowel sounds are normal. She exhibits no distension and no mass. There is no tenderness.  Mild epigastric discomfort with deep palpation. No abdominal bruit.  Musculoskeletal: She exhibits tenderness. She exhibits no edema.  Right knee S/P TKR.   Lymphadenopathy:    She has no cervical adenopathy.  Neurological: She is alert and oriented to person, place, and time. No cranial nerve deficit. Coordination normal.  11/30/15 MMSE 30/30. Failed clock drawing.  Skin: No rash noted. No erythema. No pallor.  Psychiatric: She has a normal mood and affect. Her behavior is normal. Judgment and thought content normal.    Labs reviewed: Lab Summary Latest Ref Rng 04/26/2016 04/25/2016 03/27/2016 11/26/2015  Hemoglobin 12.0 - 15.0 g/dL 14.3 12.3 (None) (None)  Hematocrit 36.0 - 46.0 % 42.0 38.0 (None) (None)  White count 3.8 - 10.8 K/uL (None) 5.3 (None) (None)  Platelet count 140 - 400 K/uL (None) 172 (None) (None)  Sodium 135 - 145 mmol/L 142 136 141 142  Potassium 3.5 - 5.1 mmol/L 3.6 3.6 3.9 3.6  Calcium 8.6 - 10.4  mg/dL (None) 8.7 9.0 9.1  Phosphorus - (None) (None) (None) (None)  Creatinine 0.44 - 1.00 mg/dL 0.70 0.72 0.70 0.79  AST 0 - 40 IU/L (None) (None) 20 19  Alk Phos 39 - 117 IU/L (None) (None) 64 63  Bilirubin 0.0 - 1.2 mg/dL (None) (None) 0.3 0.4  Glucose 65 - 99 mg/dL 95 99 93 90  Cholesterol - (None) (None) (None) (None)  HDL cholesterol >39 mg/dL (None) (None)  42 48  Triglycerides 0 - 149 mg/dL (None) (None) 190(H) 127  LDL Direct - (None) (None) (None) (None)  LDL Calc 0 - 99 mg/dL (None) (None) 99 127(H)  Total protein - (None) (None) (None) (None)  Albumin 3.5 - 4.7 g/dL (None) (None) 3.6 3.7   Lab Results  Component Value Date   TSH 3.870 07/26/2015   Lab Results  Component Value Date   BUN 19 04/26/2016   BUN 20 04/25/2016   BUN 17 03/27/2016   Lab Results  Component Value Date   HGBA1C 5.6 03/27/2016   HGBA1C 5.8* 11/26/2015   HGBA1C 5.7* 07/26/2015    Assessment/Plan  Essential hypertension - controlled  Hyperglycemia - controlled  Persistent atrial fibrillation (HCC) - currently in NSR  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  Gastroesophageal reflux disease with esophagitis - taking pantoprazole daily but still has symptoms from time to time. Recommended that she take Tums or Rolaids at those moments.

## 2016-06-04 ENCOUNTER — Other Ambulatory Visit: Payer: Self-pay | Admitting: Internal Medicine

## 2016-06-07 ENCOUNTER — Ambulatory Visit (INDEPENDENT_AMBULATORY_CARE_PROVIDER_SITE_OTHER): Payer: Medicare Other | Admitting: Cardiology

## 2016-06-07 ENCOUNTER — Encounter: Payer: Self-pay | Admitting: Cardiology

## 2016-06-07 VITALS — BP 140/76 | HR 48 | Ht 62.0 in | Wt 182.0 lb

## 2016-06-07 DIAGNOSIS — E7800 Pure hypercholesterolemia, unspecified: Secondary | ICD-10-CM | POA: Diagnosis not present

## 2016-06-07 DIAGNOSIS — I484 Atypical atrial flutter: Secondary | ICD-10-CM | POA: Diagnosis not present

## 2016-06-07 DIAGNOSIS — I10 Essential (primary) hypertension: Secondary | ICD-10-CM | POA: Diagnosis not present

## 2016-06-07 NOTE — Progress Notes (Signed)
Cardiology Office Note   Date:  06/07/2016   ID:  AFRICA TALK, DOB [birth year], MRN [id number]  PCP:  Estill Dooms, MD  Cardiologist:   Peter Martinique, MD   Chief Complaint  Patient presents with  . Follow-up    been having a low heart rate  . Shortness of Breath  . Chest Pain    heaviness/tightness on left side      History of Present Illness: [name] is a 82 y.o. female is seen for follow up Afibflutter. She has a long history of marked sinus bradycardia. She was seen by Dr. Aundra Dubin in 2014. Echo at that time showed mild LAE otherwise normal. Holter showed mean HR 59 with lowest HR 43 and peak HR 109. She reports her HR is typically 48 but she was asymptomatic. On March 9,2017 she noted an increased HR by BP monitor up to 153. At this time she felt marked indigestion from her waist to her neck. She felt her heart fluttering.   She was seen by Dr. Nyoka Cowden on 03/29/16 and found to be in Afib with RVR. She was started on Eliquis and metoprolol.  She later had attempt at [facility]. She was in an atypical atrial flutter at that time. DCCV resulted in very prolonged pauses > 6 seconds and return to flutter. She was seen by Dr. Curt Bears and placed on flecainide. This did convert her to NSR but made her feel very sick with nausea, dizziness, and extreme fatigue. Flecainide was discontinued 2 weeks ago. She is feeling better now but still feels fatigued and no energy. She is not as active as she was before. Feels some SOB. Feels sleepy all the time and husband notes she sleeps much of the day. Notes HR sometimes drops into the 30s.    Past Medical History  Diagnosis Date  . Anxiety state, unspecified   . Backache, unspecified   . Diverticulosis of colon (without mention of hemorrhage)   . Stricture and stenosis of esophagus   . Esophageal reflux   . Enthesopathy of hip region   . Diaphragmatic hernia without mention of obstruction or gangrene   . Pain in joint, pelvic region and thigh     . Pure hypercholesterolemia   . Unspecified essential hypertension   . Elevated blood pressure reading without diagnosis of hypertension   . Pain in limb   . Other dysphagia   . Cramp of limb   . Chest pain, unspecified   . Other nonspecific abnormal serum enzyme levels   . Hepatitis, unspecified   . Disturbance of skin sensation   . Unspecified vitamin D deficiency   . Pain in joint, hand   . Other abnormal blood chemistry   . Pain in joint, ankle and foot   . Plantar fascial fibromatosis   . Unspecified hypothyroidism   . Asymptomatic varicose veins   . Allergic rhinitis due to pollen   . Myalgia and myositis, unspecified   . Pain in joint, lower leg   . Sciatica   . Reflux esophagitis   . Anxiety state, unspecified   . Depressive disorder, not elsewhere classified   . Other specified cardiac dysrhythmias(427.89)   . Other malaise and fatigue   . Insomnia, unspecified   . Carpal tunnel syndrome   . Spinal stenosis, unspecified region other than cervical   . Lumbago   . Symptomatic menopausal or female climacteric states   . Unspecified essential hypertension   . Headache(784.0)   .  Other and unspecified hyperlipidemia   . Obesity, unspecified   . Diaphragmatic hernia without mention of obstruction or gangrene   . Peptic ulcer, unspecified site, unspecified as acute or chronic, without mention of hemorrhage, perforation, or obstruction     Past Surgical History  Procedure Laterality Date  . Cholecystectomy  1989    DR BOWMAN  . Abdominal hysterectomy  1979  . Knee surgery  2003  . Breast biopsy      twice  . Excision of actinic keratosis      DR LUPTON   . O.s. cataract removed  08/03/1999    DR EPES   . O.d. cataract removed  2000    DR EPES  . Laser for cloudy cap left eye  03/2006    DR DIGBY  . Replacement total knee Right 04/2006    DR RENDALL  . Replacement total knee Left 04/2004    DR RENDALL  . Knee arthroscopy Right 06-26-13  . Total knee  arthroplasty Right 07/04/2013    Procedure: RIGHT TOTAL KNEE ARTHROPLASTY;  Surgeon: Mcarthur Rossetti, MD;  Location: [facility];  Service: Orthopedics;  Laterality: Right;  . Knee closed reduction Right 10/23/2013    Procedure: CLOSED MANIPULATION RIGHT KNEE;  Surgeon: Mcarthur Rossetti, MD;  Location: [place];  Service: Orthopedics;  Laterality: Right;  . Colonoscopy  1988    Normal   . Cardioversion N/A 04/26/2016    Procedure: CARDIOVERSION;  Surgeon: Pixie Casino, MD;  Location: [facility];  Service: Cardiovascular;  Laterality: N/A;     Current Outpatient Prescriptions  Medication Sig Dispense Refill  . acetaminophen (TYLENOL) 500 MG tablet Take 500 mg by mouth as needed.    Marland Kitchen apixaban (ELIQUIS) 5 MG TABS tablet One twice daily for anticoagulation 60 tablet 5  . bimatoprost (LUMIGAN) 0.01 % SOLN Instill 1 drop into both eyes once daily in the evening    . BIOTIN 5000 PO Take 1 capsule by mouth daily.    . Cholecalciferol (VITAMIN D) 2000 UNITS tablet Take 2,000 Units by mouth daily.    . dorzolamide (TRUSOPT) 2 % ophthalmic solution Place 1 drop into both eyes 3 (three) times daily.     . furosemide (LASIX) 20 MG tablet One each morning to reduce edema (Patient taking differently: Take 40 mg by mouth daily as needed. One each morning to reduce edema) 30 tablet 5  . glucosamine-chondroitin 500-400 MG tablet Take 2 tablets by mouth daily.     Marland Kitchen KETOPROFEN EX Apply 1 application topically every evening. 20% cream; PRN for pain.    . methylcellulose (ARTIFICIAL TEARS) 1 % ophthalmic solution Place 1 drop into both eyes daily as needed (for dry eyes).     . pantoprazole (PROTONIX) 40 MG tablet TAKE 1 TABLET BY MOUTH NIGHTLY TO REDUCE STOMACH ACID 30 tablet 5  . traZODone (DESYREL) 50 MG tablet Take 25 mg by mouth at bedtime.    . valsartan-hydrochlorothiazide (DIOVAN-HCT) 320-25 MG tablet TAKE 1 TABLET BY MOUTH EVERY DAY 30 tablet 5  . vitamin E 400 UNIT capsule Take 400 Units by  mouth daily.     No current facility-administered medications for this visit.    Allergies:   Advil; Aleve; Aspirin; Atorvastatin; Celebrex; Diclofenac; Doxycycline; Metoclopramide hcl; Nsaids; Other; Pravastatin; and Timolol    Social History:  The patient  reports that she has never smoked. She has never used smokeless tobacco. She reports that she does not drink alcohol or use illicit drugs.  Family History:  The patient's family history includes Cerebrovascular Accident in her mother; Heart disease in her father; Hypertension in her brother; Obesity in her daughter; Ovarian cancer in her mother; Uterine cancer in her mother.    ROS:  Please see the history of present illness.   Otherwise, review of systems are positive for none.   All other systems are reviewed and negative.    PHYSICAL EXAM: VS:  BP 140/76 mmHg  Pulse 48  Ht 5\' 2"  (1.575 m)  Wt 182 lb (82.555 kg)  BMI 33.28 kg/m2 , BMI Body mass index is 33.28 kg/(m^2). GEN: Well nourished, well developed, in no acute distress HEENT: normal Neck: no JVD, carotid bruits, or masses Cardiac:RRR; no murmurs, rubs, or gallops,no edema  Respiratory:  clear to auscultation bilaterally, normal work of breathing GI: soft, nontender, nondistended, + BS MS: no deformity or atrophy Skin: warm and dry, no rash Neuro:  Strength and sensation are intact Psych: euthymic mood, full affect   EKG:  EKG is  ordered today. Sinus brady with rate 48. Nonspecific TWA. I have personally reviewed and interpreted this study.     Recent Labs: 07/26/2015: TSH 3.870 03/27/2016: ALT 16 04/25/2016: Platelets 172 04/26/2016: BUN 19; Creatinine, Ser 0.70; Hemoglobin 14.3; Potassium 3.6; Sodium 142    Lipid Panel    Component Value Date/Time   CHOL 179 03/27/2016 0853   TRIG 190* 03/27/2016 0853   HDL 42 03/27/2016 0853   CHOLHDL 4.3 03/27/2016 0853   LDLCALC 99 03/27/2016 0853      Wt Readings from Last 3 Encounters:  06/07/16 182 lb (82.555  kg)  05/30/16 185 lb (83.915 kg)  05/25/16 188 lb 9.6 oz (85.548 kg)      Other studies Reviewed: Additional studies/ records that were reviewed today include: recent Echo and Myoview Review of the above records demonstrates:   Study Highlights     The left ventricular ejection fraction is mildly decreased (45-54%).  Nuclear stress EF: 51%.  There was no ST segment deviation noted during stress.  The study is normal.  This is a low risk study.  Low risk stress nuclear study with normal perfusion and borderline left ventricular global systolic function.  Consider alternative imaging for confirmation of LVEF, which may be underestimated on current study,    Study Conclusions  - Left ventricle: The cavity size was normal. Wall thickness was  normal. Systolic function was normal. The estimated ejection  fraction was in the range of 55% to 60%.   ASSESSMENT AND PLAN:  1.  Atrial fibrillation/flutter with RVR. Unsuccessful DCCV with marked conversion pauses. She did convert on flecainide but unable to tolerate this medication. Now still with fatigue, excessive sleepiness which I think is still related to bradycardia. Will stop metoprolol. Have her wear an event monitor. If she has recurrent AFib/flutter she may well need a pacemaker in order to treat her tachycardia. Has follow up with EP in August.   2. HTN well controlled.  3. Mild hypercholesterolemia.    Current medicines are reviewed at length with the patient today.  The patient does not have concerns regarding medicines.  The following changes have been made:  Stop metoprolol  Labs/ tests ordered today include:   Orders Placed This Encounter  Procedures  . Cardiac event monitor  . EKG 12-Lead     Disposition: follow up with me in 4 months. Event monitor.   Signed, Peter Martinique, MD  06/07/2016 2:47 PM    [facility] [address] [place], [place], [address] Phone [phone],  Fax [phone]

## 2016-06-07 NOTE — Patient Instructions (Signed)
Stop taking metoprolol and flecainide  We will have you wear a monitor for one month.  Continue your other therapy  I will see you in 4 months

## 2016-06-08 ENCOUNTER — Ambulatory Visit (INDEPENDENT_AMBULATORY_CARE_PROVIDER_SITE_OTHER): Payer: Medicare Other

## 2016-06-08 DIAGNOSIS — I484 Atypical atrial flutter: Secondary | ICD-10-CM | POA: Diagnosis not present

## 2016-06-08 DIAGNOSIS — I10 Essential (primary) hypertension: Secondary | ICD-10-CM

## 2016-06-08 DIAGNOSIS — E7800 Pure hypercholesterolemia, unspecified: Secondary | ICD-10-CM | POA: Diagnosis not present

## 2016-06-12 ENCOUNTER — Telehealth: Payer: Self-pay | Admitting: Cardiology

## 2016-06-12 NOTE — Telephone Encounter (Signed)
Spoke with patient and at ov last week Dr Martinique d/c Metoprolol 25 mg twice a day Yesterday and today heart rate around 118 NSR and blood pressure 118/97 She does get a "little" dizzy and short of breath when she goes up the stairs. She did press record button on monitor today when this occurred, no event uploaded at this time.  When she sits down to rest resolves quickly.   Patient states outside of this she feels good Concerned about her HR going up each day Will forward to Dr Martinique for review

## 2016-06-12 NOTE — Telephone Encounter (Signed)
Advised patient, verbalized understanding She will call back Wednesday with update

## 2016-06-12 NOTE — Telephone Encounter (Signed)
Pt calling that everyday that she has been off Metoprolol x 1week tomorrow and heart rate has been up every day since-pls advise-[phone]

## 2016-06-12 NOTE — Telephone Encounter (Signed)
She needs to record episodes so we can review her rhythm. This is why she is wearing the monitor. We need to document what her rhythm is doing at these times.   Peter Martinique MD, [facility]

## 2016-06-13 NOTE — Telephone Encounter (Signed)
Returned call to patient no answer.LMTC. 

## 2016-06-14 ENCOUNTER — Telehealth: Payer: Self-pay | Admitting: Cardiology

## 2016-06-14 MED ORDER — DILTIAZEM HCL 30 MG PO TABS: ORAL_TABLET | ORAL | Status: DC

## 2016-06-14 NOTE — Telephone Encounter (Signed)
Received call from Dr.Taylor's nurse Janan Halter RN.Dr.Taylor's schedule is full tomorrow.She will speak to Dr.Allred this afternoon about patient and call me back.

## 2016-06-14 NOTE — Telephone Encounter (Addendum)
Discussed with Dr Rayann Heman and will have the patient take Cardizem 30 mg for fast heart rates as needed--take 1 tablet by mouth every 6 hours as needed for fast heart rates and follow up with Dr Lovena Le tomorrow at 12:30.

## 2016-06-14 NOTE — Addendum Note (Signed)
Addended by: Janan Halter F on: 06/14/2016 04:42 PM   Modules accepted: Orders

## 2016-06-14 NOTE — Telephone Encounter (Signed)
New message      Calling with an abn EKG reading

## 2016-06-14 NOTE — Telephone Encounter (Signed)
Received a call from [facility] with [employer] calling to report around 12:20 pm this afternoon monitor revealed atrial flutter rate 240 lasted about 1 min dropped down to sinus tach rate 118.Stated she has been in and out of atrial flutter with rates ranging 240 to 150.She will fax strips to office for Dr.Jordan to review.  Spoke to patient earlier this morning.She stated she woke up this morning with heart rate 130.Stated heart rate has been [id number].Stated she has been sob and dizzy.   Message sent to [place] for advice.

## 2016-06-14 NOTE — Telephone Encounter (Addendum)
Returned call to patient.Dr.Jordan advised needs to see Dr.Camnitz ASAP.Spoke to Tony EP [employer] on vacation until 7/3.Stated Dr.Taylor is DOD tomorrow.She will speak to [facility] his nurse about working patient into his schedule.When I receive monitor strips I will fax to [facility].

## 2016-06-14 NOTE — Telephone Encounter (Signed)
Spoke to patient.She stated when she woke up at 5:00 am today heart rate 130,at 9:30 am 128 at present 143.Stated she was sob and dizzy B/P 114/91.Stated she called [employer] last night to confirm monitor working.Stated they will fax monitor recordings to [place].Advised I will call her back once I receive recordings.  Spoke to [employer] at [employer].She will call [employer] and have them fax recordings to me at [facility] office.

## 2016-06-14 NOTE — Telephone Encounter (Signed)
There is no way for me to view strips since I am in the hospital and nothing scanned into Epic but this sounds like recurrent atrial flutter with rapid rates. She has been intolerant of Flecainide due to side effects and metoprolol due to bradycardia. She needs to see Dr. Curt Bears back ASAP. I think she needs a pacemaker in order to treat her tachycardia or an ablation. No medical options at this point.  Raye Wiens Martinique MD, [facility]

## 2016-06-15 ENCOUNTER — Encounter: Payer: Self-pay | Admitting: Internal Medicine

## 2016-06-15 ENCOUNTER — Encounter: Payer: Self-pay | Admitting: *Deleted

## 2016-06-15 ENCOUNTER — Ambulatory Visit (INDEPENDENT_AMBULATORY_CARE_PROVIDER_SITE_OTHER): Payer: Medicare Other | Admitting: Internal Medicine

## 2016-06-15 VITALS — BP 120/88 | HR 131 | Ht 62.0 in | Wt 182.8 lb

## 2016-06-15 DIAGNOSIS — I484 Atypical atrial flutter: Secondary | ICD-10-CM | POA: Diagnosis not present

## 2016-06-15 DIAGNOSIS — Z01812 Encounter for preprocedural laboratory examination: Secondary | ICD-10-CM

## 2016-06-15 LAB — CBC WITH DIFFERENTIAL/PLATELET
BASOS ABS: 70 {cells}/uL (ref 0–200)
Basophils Relative: 1 %
EOS PCT: 3 %
Eosinophils Absolute: 210 cells/uL (ref 15–500)
HCT: 41.6 % (ref 35.0–45.0)
Hemoglobin: 13.7 g/dL (ref 11.7–15.5)
LYMPHS PCT: 35 %
Lymphs Abs: 2450 cells/uL (ref 850–3900)
MCH: 29.3 pg (ref 27.0–33.0)
MCHC: 32.9 g/dL (ref 32.0–36.0)
MCV: 89.1 fL (ref 80.0–100.0)
MPV: 10.3 fL (ref 7.5–12.5)
Monocytes Absolute: 560 cells/uL (ref 200–950)
Monocytes Relative: 8 %
NEUTROS PCT: 53 %
Neutro Abs: 3710 cells/uL (ref 1500–7800)
Platelets: 201 10*3/uL (ref 140–400)
RBC: 4.67 MIL/uL (ref 3.80–5.10)
RDW: 14.3 % (ref 11.0–15.0)
WBC: 7 10*3/uL (ref 3.8–10.8)

## 2016-06-15 LAB — BASIC METABOLIC PANEL
BUN: 21 mg/dL (ref 7–25)
CALCIUM: 9.3 mg/dL (ref 8.6–10.4)
CO2: 26 mmol/L (ref 20–31)
CREATININE: 0.85 mg/dL (ref 0.60–0.88)
Chloride: 102 mmol/L (ref 98–110)
GLUCOSE: 101 mg/dL — AB (ref 65–99)
Potassium: 4 mmol/L (ref 3.5–5.3)
Sodium: 138 mmol/L (ref 135–146)

## 2016-06-15 NOTE — Patient Instructions (Addendum)
Medication Instructions:  Your physician recommends that you continue on your current medications as directed. Please refer to the Current Medication list given to you today.  Labwork: Pre procedure labs today: BMET, CBCD  Testing/Procedures: Your physician has recommended that you have a pacemaker inserted. A pacemaker is a small device that is placed under the skin of your chest or abdomen to help control abnormal heart rhythms. This device uses electrical pulses to prompt the heart to beat at a normal rate. Pacemakers are used to treat heart rhythms that are too slow. Wire (leads) are attached to the pacemaker that goes into the chambers of you heart. This is done in the hospital and usually requires and overnight stay. Please see the instruction sheet given to you today for more information.  Follow-Up: Your physician recommends that you schedule a follow-up appointment in: 10-14 days, after your procedure on 06/21/16, with device clinic for a wound check.  Your physician recommends that you schedule a follow-up appointment in: 3 months, after your procedure on 06/21/16,  with Dr. Lovena Le.  If you need a refill on your cardiac medications before your next appointment, please call your pharmacy.  Thank you for choosing [facility]!!     Any Other Special Instructions Will Be Listed Below (If Applicable). Pacemaker Implantation The heart has its own electrical system, or natural pacemaker, to regulate the heartbeat. Sometimes, the natural pacemaker system of the heart fails and causes the heart to beat too slowly. If this happens, a pacemaker can be surgically placed to help the heart beat at a normal or programmed rate. A pacemaker is a small, battery-powered device that is placed under the skin and is programmed to sense your heartbeats. If your heart rate is lower than the programmed rate, the pacemaker will pace your heart. Parts of a pacemaker include:  Wires or leads. The leads are  placed in the heart and transmit electricity to the heart. The leads are connected to the pulse generator.  Pulse generator. The pulse generator contains a computer and a memory system. The pulse generator also produces the electrical signal that triggers the heart to beat. A pacemaker may be placed if:  You have a slow heartbeat (bradycardia).  You have fainting (syncope).  Shortness of breath (dyspnea) due to heart problems. LET [facility] CARE PROVIDER KNOW ABOUT:  Any allergies you may have.  All medicines you are taking, including vitamins, herbs, eye drops, creams, and over-the-counter medicines.  Previous problems you or members of your family have had with the use of anesthetics.  Any blood disorders you have.  Previous surgeries you have had.  Medical conditions you have.  Possibility of pregnancy, if this applies. RISKS AND COMPLICATIONS Generally, pacemaker implantation is a safe procedure. However, problems can occur and include:  Bleeding.  Unable to place the pacemaker under local sedation.  Infection. BEFORE THE PROCEDURE  You will have blood work drawn before the procedure.  Do not use any tobacco products including cigarettes, chewing tobacco, or electronic cigarettes. If you need help quitting, ask your health care provider.  Do not eat or drink anything after midnight on the night before the procedure or as directed by your health care provider.  Ask your health care provider about:  Changing or stopping your regular medicines. This is especially important if you are taking diabetes medicines or blood thinners.  Taking medicines such as aspirin and ibuprofen. These medicines can thin your blood. Do not take these medicines before your  procedure if your health care provider asks you not to.  Ask your health care provider if you can take a sip of water with any approved medicines the morning of the procedure. PROCEDURE  The surgery to place a  pacemaker is considered a minimally invasive surgical procedure. It is done under a local anesthetic, which is an injection at the incision site that makes the skin numb. You are also given sedation and pain medicine that makes you drowsy during the procedure.   An intravenous line (IV) will be started in your hand or arm so sedation and pain medicine can be given during the pacemaker procedure.  A numbing medicine will be injected into the skin where the pacemaker is to be placed. A small incision will then be made into the skin. The pacemaker is usually placed under the skin near the collarbone.  After the incision has been made, the leads will be inserted into a large vein and guided into the heart using X-ray.  Using the same incision that was used to place the leads, a small pocket will be created under the skin to hold the pulse generator. The leads will then be connected to the pulse generator.  The incision site will then be closed. A bandage (dressing) is placed over the pacemaker site. The dressing is removed 24-48 hours afterward. AFTER THE PROCEDURE  You will be taken to a recovery area after the pacemaker implant. Your vital signs such as blood pressure, heart rate, breathing, and oxygen levels will be monitored.  A chest X-ray will be done after the pacemaker has been implanted. This is to make sure the pacemaker and leads are in the correct place.   This information is not intended to replace advice given to you by your health care provider. Make sure you discuss any questions you have with your health care provider.   Document Released: 12/01/2002 Document Revised: 01/01/2015 Document Reviewed: 04/16/2012 Elsevier Interactive Patient Education [employer].

## 2016-06-15 NOTE — Progress Notes (Signed)
HPI [name] is referred today for evaluation of atrial flutter with 1:1 AV conduction. The patient is followed by Dr. Martinique and has atrial flutter with a RVR (atypical) who was placed on flecainide with return to NSR. She was quite bradycardic with HR's in the low 40's. She felt poorly and ultimately self dced her flecainide and beta blocker. She wore a cardiac monitor and called our office yesterday and was noted on the cardia monitor to have a narrow QRS tachy at 240/min. She was treated with AV nodal blocking drugs and feels better. She presents today to discuss her treatment options. She denies syncope. Allergies  Allergen Reactions  . Advil [Ibuprofen]     GI upset  . Aleve [Naproxen Sodium]     GI upset  . Aspirin Nausea And Vomiting    Takes baby aspirin qod without problems  . Atorvastatin     Leg cramps  . Celebrex [Celecoxib]     GI upset  . Diclofenac     GI upset  . Doxycycline     Headache  . Metoclopramide Hcl     REACTION: heart palps  . Nsaids Other (See Comments)    Tears my stomach up   . Other     Antihistamine - increase BP  . Pravastatin     Leg cramps  . Timolol     Dropped HR     Current Outpatient Prescriptions  Medication Sig Dispense Refill  . acetaminophen (TYLENOL) 500 MG tablet Take 500 mg by mouth as needed.    Marland Kitchen apixaban (ELIQUIS) 5 MG TABS tablet One twice daily for anticoagulation 60 tablet 5  . bimatoprost (LUMIGAN) 0.01 % SOLN Instill 1 drop into both eyes once daily in the evening    . BIOTIN 5000 PO Take 1 capsule by mouth daily.    . Cholecalciferol (VITAMIN D) 2000 UNITS tablet Take 2,000 Units by mouth daily.    Marland Kitchen diltiazem (CARDIZEM) 30 MG tablet Take 1 tablet by mouth every 6 hours as needed for fast heart rates 30 tablet 0  . dorzolamide (TRUSOPT) 2 % ophthalmic solution Place 1 drop into both eyes 3 (three) times daily.     . furosemide (LASIX) 40 MG tablet Take 40 mg by mouth as directed.     Marland Kitchen  glucosamine-chondroitin 500-400 MG tablet Take 2 tablets by mouth daily.     Marland Kitchen KETOPROFEN EX Apply 1 application topically every evening. 20% cream; PRN for pain.    . methylcellulose (ARTIFICIAL TEARS) 1 % ophthalmic solution Place 1 drop into both eyes daily as needed (for dry eyes).     . pantoprazole (PROTONIX) 40 MG tablet TAKE 1 TABLET BY MOUTH NIGHTLY TO REDUCE STOMACH ACID 30 tablet 5  . traZODone (DESYREL) 50 MG tablet Take 25 mg by mouth at bedtime.    . valsartan-hydrochlorothiazide (DIOVAN-HCT) 320-25 MG tablet TAKE 1 TABLET BY MOUTH EVERY DAY 30 tablet 5  . vitamin E 400 UNIT capsule Take 400 Units by mouth daily.     No current facility-administered medications for this visit.     Past Medical History  Diagnosis Date  . Anxiety state, unspecified   . Backache, unspecified   . Diverticulosis of colon (without mention of hemorrhage)   . Stricture and stenosis of esophagus   . Esophageal reflux   . Enthesopathy of hip region   . Diaphragmatic hernia without mention of obstruction or gangrene   . Pain in joint, pelvic region  and thigh   . Pure hypercholesterolemia   . Unspecified essential hypertension   . Elevated blood pressure reading without diagnosis of hypertension   . Pain in limb   . Other dysphagia   . Cramp of limb   . Chest pain, unspecified   . Other nonspecific abnormal serum enzyme levels   . Hepatitis, unspecified   . Disturbance of skin sensation   . Unspecified vitamin D deficiency   . Pain in joint, hand   . Other abnormal blood chemistry   . Pain in joint, ankle and foot   . Plantar fascial fibromatosis   . Unspecified hypothyroidism   . Asymptomatic varicose veins   . Allergic rhinitis due to pollen   . Myalgia and myositis, unspecified   . Pain in joint, lower leg   . Sciatica   . Reflux esophagitis   . Anxiety state, unspecified   . Depressive disorder, not elsewhere classified   . Other specified cardiac dysrhythmias(427.89)   . Other  malaise and fatigue   . Insomnia, unspecified   . Carpal tunnel syndrome   . Spinal stenosis, unspecified region other than cervical   . Lumbago   . Symptomatic menopausal or female climacteric states   . Unspecified essential hypertension   . Headache(784.0)   . Other and unspecified hyperlipidemia   . Obesity, unspecified   . Diaphragmatic hernia without mention of obstruction or gangrene   . Peptic ulcer, unspecified site, unspecified as acute or chronic, without mention of hemorrhage, perforation, or obstruction     ROS:   All systems reviewed and negative except as noted in the HPI.   Past Surgical History  Procedure Laterality Date  . Cholecystectomy  1989    DR BOWMAN  . Abdominal hysterectomy  1979  . Knee surgery  2003  . Breast biopsy      twice  . Excision of actinic keratosis      DR LUPTON   . O.s. cataract removed  08/03/1999    DR EPES   . O.d. cataract removed  2000    DR EPES  . Laser for cloudy cap left eye  03/2006    DR DIGBY  . Replacement total knee Right 04/2006    DR RENDALL  . Replacement total knee Left 04/2004    DR RENDALL  . Knee arthroscopy Right 06-26-13  . Total knee arthroplasty Right 07/04/2013    Procedure: RIGHT TOTAL KNEE ARTHROPLASTY;  Surgeon: Mcarthur Rossetti, MD;  Location: [facility];  Service: Orthopedics;  Laterality: Right;  . Knee closed reduction Right 10/23/2013    Procedure: CLOSED MANIPULATION RIGHT KNEE;  Surgeon: Mcarthur Rossetti, MD;  Location: [facility];  Service: Orthopedics;  Laterality: Right;  . Colonoscopy  1988    Normal   . Cardioversion N/A 04/26/2016    Procedure: CARDIOVERSION;  Surgeon: Pixie Casino, MD;  Location: [facility];  Service: Cardiovascular;  Laterality: N/A;     Family History  Problem Relation Age of Onset  . Ovarian cancer Mother   . Uterine cancer Mother   . Heart disease Father   . Cerebrovascular Accident Mother   . Hypertension Brother   . Obesity Daughter      Social  History   Social History  . Marital Status: Married    Spouse Name: N/A  . Number of Children: N/A  . Years of Education: N/A   Occupational History  . Not on file.   Social History Main Topics  .  Smoking status: Never Smoker   . Smokeless tobacco: Never Used  . Alcohol Use: No  . Drug Use: No  . Sexual Activity: Not Currently   Other Topics Concern  . Not on file   Social History Narrative     BP 120/88 mmHg  Pulse 131  Ht 5\' 2"  (1.575 m)  Wt 182 lb 12.8 oz (82.918 kg)  BMI 33.43 kg/m2  SpO2 94%  Physical Exam:  Well appearing 82 yo woman, NAD HEENT: Unremarkable Neck:  6 cm JVD, no thyromegally Lymphatics:  No adenopathy Back:  No CVA tenderness Lungs:  Clear with no wheezes HEART:  Regular tachy rhythm, no murmurs, no rubs, no clicks Abd:  soft, positive bowel sounds, no organomegally, no rebound, no guarding Ext:  2 plus pulses, no edema, no cyanosis, no clubbing Skin:  No rashes no nodules Neuro:  CN II through XII intact, motor grossly intact  EKG - atypical atrial flutter with a RVR (2:1 AV conduction)   Assess/Plan: 1. Atrial flutter with a RVR including 1:1 AV conduction - I have asked her in the short term to uptitrate her calcium channel blockers. 2. Sinus node dysfunction - on flecainide, she has been very slow. I have recommended she undergo PPM insertion followed by re-initiation of her flecainide and beta blocker.  3. Coags - she will hold one dose of Eliquis prior to PPM insertion. 4. HTN - her blood pressure is well controlled.  Mikle Bosworth.D.

## 2016-06-16 ENCOUNTER — Telehealth: Payer: Self-pay | Admitting: Internal Medicine

## 2016-06-16 NOTE — Telephone Encounter (Signed)
Advsied patient that she is scheduled for a pacemaker implant next week, not an ablation (ablation was originally scheduled by error). She understands that I will call the hospital and correct this with the pre service center.  (procedure was corrected yesterday when procedure was scheduled). Spoke with pre service center and advised them patient is scheduled for PPM implant. Patient thanks me for helping.

## 2016-06-16 NOTE — Telephone Encounter (Signed)
See 06/15/16 note.

## 2016-06-16 NOTE — Telephone Encounter (Signed)
Pt calling re hospital call regarding procedure for Pacemaker-hospital has her down for Ablation-needs our office to call to change and pt request call back to let her know it's been done- pls call

## 2016-06-20 ENCOUNTER — Encounter (HOSPITAL_COMMUNITY): Payer: Self-pay | Admitting: Cardiology

## 2016-06-20 MED ORDER — SODIUM CHLORIDE 0.9 % IR SOLN
80.0000 mg | Status: AC
  Administered 2016-06-21: 80 mg
  Filled 2016-06-20: qty 2

## 2016-06-20 MED ORDER — CEFAZOLIN SODIUM-DEXTROSE 2-4 GM/100ML-% IV SOLN
2.0000 g | INTRAVENOUS | Status: AC
  Administered 2016-06-21: 2 g via INTRAVENOUS
  Filled 2016-06-20: qty 100

## 2016-06-21 ENCOUNTER — Ambulatory Visit (HOSPITAL_COMMUNITY)
Admission: RE | Admit: 2016-06-21 | Discharge: 2016-06-22 | Disposition: A | Discharge status: Home / Self Care | Payer: Medicare Other | Source: Ambulatory Visit | Attending: Internal Medicine | Admitting: Internal Medicine

## 2016-06-21 ENCOUNTER — Encounter (HOSPITAL_COMMUNITY): Payer: Self-pay | Admitting: Internal Medicine

## 2016-06-21 ENCOUNTER — Encounter (HOSPITAL_COMMUNITY)
Admission: RE | Disposition: A | Discharge status: Home / Self Care | Payer: Self-pay | Source: Ambulatory Visit | Attending: Internal Medicine

## 2016-06-21 DIAGNOSIS — I495 Sick sinus syndrome: Secondary | ICD-10-CM | POA: Diagnosis not present

## 2016-06-21 DIAGNOSIS — E559 Vitamin D deficiency, unspecified: Secondary | ICD-10-CM | POA: Diagnosis not present

## 2016-06-21 DIAGNOSIS — I10 Essential (primary) hypertension: Secondary | ICD-10-CM | POA: Diagnosis not present

## 2016-06-21 DIAGNOSIS — I4891 Unspecified atrial fibrillation: Secondary | ICD-10-CM | POA: Diagnosis present

## 2016-06-21 DIAGNOSIS — Z950 Presence of cardiac pacemaker: Secondary | ICD-10-CM

## 2016-06-21 DIAGNOSIS — I484 Atypical atrial flutter: Secondary | ICD-10-CM | POA: Insufficient documentation

## 2016-06-21 DIAGNOSIS — F329 Major depressive disorder, single episode, unspecified: Secondary | ICD-10-CM | POA: Insufficient documentation

## 2016-06-21 DIAGNOSIS — Z6832 Body mass index (BMI) 32.0-32.9, adult: Secondary | ICD-10-CM | POA: Diagnosis not present

## 2016-06-21 DIAGNOSIS — Z96651 Presence of right artificial knee joint: Secondary | ICD-10-CM | POA: Diagnosis not present

## 2016-06-21 DIAGNOSIS — Z7901 Long term (current) use of anticoagulants: Secondary | ICD-10-CM | POA: Diagnosis not present

## 2016-06-21 DIAGNOSIS — E669 Obesity, unspecified: Secondary | ICD-10-CM | POA: Insufficient documentation

## 2016-06-21 DIAGNOSIS — Z79899 Other long term (current) drug therapy: Secondary | ICD-10-CM | POA: Insufficient documentation

## 2016-06-21 DIAGNOSIS — R001 Bradycardia, unspecified: Secondary | ICD-10-CM | POA: Diagnosis present

## 2016-06-21 DIAGNOSIS — K210 Gastro-esophageal reflux disease with esophagitis: Secondary | ICD-10-CM | POA: Diagnosis not present

## 2016-06-21 HISTORY — DX: Other chronic pain: G89.29

## 2016-06-21 HISTORY — DX: Unspecified osteoarthritis, unspecified site: M19.90

## 2016-06-21 HISTORY — PX: EP IMPLANTABLE DEVICE: SHX172B

## 2016-06-21 HISTORY — DX: Low back pain, unspecified: M54.50

## 2016-06-21 HISTORY — DX: Presence of cardiac pacemaker: Z95.0

## 2016-06-21 HISTORY — DX: Personal history of other medical treatment: Z92.89

## 2016-06-21 HISTORY — DX: Migraine, unspecified, not intractable, without status migrainosus: G43.909

## 2016-06-21 HISTORY — DX: Personal history of other diseases of the digestive system: Z87.19

## 2016-06-21 HISTORY — DX: Low back pain: M54.5

## 2016-06-21 HISTORY — PX: INSERT / REPLACE / REMOVE PACEMAKER: SUR710

## 2016-06-21 LAB — SURGICAL PCR SCREEN
MRSA, PCR: NEGATIVE
STAPHYLOCOCCUS AUREUS: NEGATIVE

## 2016-06-21 SURGERY — PACEMAKER IMPLANT
Anesthesia: LOCAL

## 2016-06-21 MED ORDER — ONDANSETRON HCL 4 MG/2ML IJ SOLN: 4.0000 mg | Freq: Four times a day (QID) | INTRAMUSCULAR | Status: DC | PRN

## 2016-06-21 MED ORDER — MUPIROCIN 2 % EX OINT: 1.0000 "application " | TOPICAL_OINTMENT | Freq: Once | CUTANEOUS | Status: DC

## 2016-06-21 MED ORDER — LIDOCAINE HCL (PF) 1 % IJ SOLN
INTRAMUSCULAR | Status: AC
  Filled 2016-06-21: qty 60

## 2016-06-21 MED ORDER — SODIUM CHLORIDE 0.9 % IV SOLN: INTRAVENOUS | Status: DC

## 2016-06-21 MED ORDER — DORZOLAMIDE HCL 2 % OP SOLN
1.0000 [drp] | Freq: Three times a day (TID) | OPHTHALMIC | Status: DC
  Administered 2016-06-21 – 2016-06-22 (×3): 1 [drp] via OPHTHALMIC
  Filled 2016-06-21: qty 10

## 2016-06-21 MED ORDER — METOPROLOL TARTRATE 5 MG/5ML IV SOLN
INTRAVENOUS | Status: AC
  Filled 2016-06-21: qty 5

## 2016-06-21 MED ORDER — MIDAZOLAM HCL 5 MG/5ML IJ SOLN
INTRAMUSCULAR | Status: AC
  Filled 2016-06-21: qty 5

## 2016-06-21 MED ORDER — SODIUM CHLORIDE 0.9 % IR SOLN
Status: AC
  Filled 2016-06-21: qty 2

## 2016-06-21 MED ORDER — LIDOCAINE HCL (PF) 1 % IJ SOLN
INTRAMUSCULAR | Status: DC | PRN
  Administered 2016-06-21: 57 mL

## 2016-06-21 MED ORDER — YOU HAVE A PACEMAKER BOOK
Freq: Once | Status: AC
  Administered 2016-06-21: 21:00:00
  Filled 2016-06-21: qty 1

## 2016-06-21 MED ORDER — OFF THE BEAT BOOK
Freq: Once | Status: AC
  Administered 2016-06-21: 21:00:00
  Filled 2016-06-21: qty 1

## 2016-06-21 MED ORDER — HEPARIN (PORCINE) IN NACL 2-0.9 UNIT/ML-% IJ SOLN
INTRAMUSCULAR | Status: DC | PRN
Start: 2016-06-21 — End: 2016-06-21
  Administered 2016-06-21: 09:00:00

## 2016-06-21 MED ORDER — ACETAMINOPHEN 500 MG PO TABS: 500.0000 mg | ORAL_TABLET | Freq: Four times a day (QID) | ORAL | Status: DC | PRN

## 2016-06-21 MED ORDER — FENTANYL CITRATE (PF) 100 MCG/2ML IJ SOLN
INTRAMUSCULAR | Status: AC
  Filled 2016-06-21: qty 2

## 2016-06-21 MED ORDER — HEPARIN (PORCINE) IN NACL 2-0.9 UNIT/ML-% IJ SOLN
INTRAMUSCULAR | Status: AC
  Filled 2016-06-21: qty 500

## 2016-06-21 MED ORDER — APIXABAN 5 MG PO TABS
5.0000 mg | ORAL_TABLET | Freq: Two times a day (BID) | ORAL | Status: DC
  Administered 2016-06-21 – 2016-06-22 (×2): 5 mg via ORAL
  Filled 2016-06-21 (×2): qty 1

## 2016-06-21 MED ORDER — HYDROCHLOROTHIAZIDE 25 MG PO TABS
25.0000 mg | ORAL_TABLET | Freq: Every day | ORAL | Status: DC
  Administered 2016-06-22: 09:00:00 25 mg via ORAL
  Filled 2016-06-21: qty 1

## 2016-06-21 MED ORDER — METOPROLOL TARTRATE 5 MG/5ML IV SOLN
INTRAVENOUS | Status: DC | PRN
  Administered 2016-06-21: 5 mg via INTRAVENOUS

## 2016-06-21 MED ORDER — ACETAMINOPHEN 325 MG PO TABS
325.0000 mg | ORAL_TABLET | ORAL | Status: DC | PRN
  Administered 2016-06-21: 16:00:00 650 mg via ORAL
  Filled 2016-06-21: qty 2

## 2016-06-21 MED ORDER — MUPIROCIN 2 % EX OINT
TOPICAL_OINTMENT | CUTANEOUS | Status: AC
  Administered 2016-06-21: 1
  Filled 2016-06-21: qty 22

## 2016-06-21 MED ORDER — IRBESARTAN 300 MG PO TABS
300.0000 mg | ORAL_TABLET | Freq: Every day | ORAL | Status: DC
  Administered 2016-06-22: 300 mg via ORAL
  Filled 2016-06-21: qty 1

## 2016-06-21 MED ORDER — VALSARTAN-HYDROCHLOROTHIAZIDE 320-25 MG PO TABS: 1.0000 | ORAL_TABLET | Freq: Every day | ORAL | Status: DC

## 2016-06-21 MED ORDER — METOPROLOL TARTRATE 25 MG PO TABS
50.0000 mg | ORAL_TABLET | Freq: Two times a day (BID) | ORAL | Status: DC
  Administered 2016-06-21 – 2016-06-22 (×3): 50 mg via ORAL
  Filled 2016-06-21 (×3): qty 2

## 2016-06-21 MED ORDER — TRAZODONE HCL 50 MG PO TABS
25.0000 mg | ORAL_TABLET | Freq: Every day | ORAL | Status: DC
  Administered 2016-06-21: 21:00:00 25 mg via ORAL
  Filled 2016-06-21: qty 1

## 2016-06-21 MED ORDER — CEFAZOLIN SODIUM-DEXTROSE 2-4 GM/100ML-% IV SOLN
INTRAVENOUS | Status: AC
  Filled 2016-06-21: qty 100

## 2016-06-21 MED ORDER — CEFAZOLIN IN D5W 1 GM/50ML IV SOLN
1.0000 g | Freq: Four times a day (QID) | INTRAVENOUS | Status: AC
  Administered 2016-06-21 – 2016-06-22 (×3): 1 g via INTRAVENOUS
  Filled 2016-06-21 (×3): qty 50

## 2016-06-21 MED ORDER — VITAMIN D 1000 UNITS PO TABS
2000.0000 [IU] | ORAL_TABLET | Freq: Every day | ORAL | Status: DC
Start: 2016-06-21 — End: 2016-06-22
  Administered 2016-06-21 – 2016-06-22 (×2): 2000 [IU] via ORAL
  Filled 2016-06-21 (×2): qty 2

## 2016-06-21 MED ORDER — POLYVINYL ALCOHOL 1.4 % OP SOLN
1.0000 [drp] | Freq: Every day | OPHTHALMIC | Status: DC | PRN
  Filled 2016-06-21: qty 15

## 2016-06-21 MED ORDER — MIDAZOLAM HCL 5 MG/5ML IJ SOLN
INTRAMUSCULAR | Status: DC | PRN
  Administered 2016-06-21 (×7): 1 mg via INTRAVENOUS

## 2016-06-21 MED ORDER — FENTANYL CITRATE (PF) 100 MCG/2ML IJ SOLN
INTRAMUSCULAR | Status: DC | PRN
  Administered 2016-06-21 (×5): 12.5 ug via INTRAVENOUS
  Administered 2016-06-21: 25 ug via INTRAVENOUS

## 2016-06-21 MED ORDER — CHLORHEXIDINE GLUCONATE 4 % EX LIQD: 60.0000 mL | Freq: Once | CUTANEOUS | Status: DC

## 2016-06-21 SURGICAL SUPPLY — 7 items
CABLE SURGICAL S-101-97-12 (CABLE) ×1 IMPLANT
LEAD TENDRIL MRI 46CM LPA1200M (Lead) ×1 IMPLANT
LEAD TENDRIL MRI 52CM LPA1200M (Lead) ×1 IMPLANT
PACEMAKER ASSURITY DR-RF (Pacemaker) ×1 IMPLANT
PAD DEFIB LIFELINK (PAD) ×1 IMPLANT
SHEATH CLASSIC 8F (SHEATH) ×2 IMPLANT
TRAY PACEMAKER INSERTION (PACKS) ×1 IMPLANT

## 2016-06-21 NOTE — H&P (View-Only) (Signed)
HPI [name] is referred today for evaluation of atrial flutter with 1:1 AV conduction. The patient is followed by Dr. Martinique and has atrial flutter with a RVR (atypical) who was placed on flecainide with return to NSR. She was quite bradycardic with HR's in the low 40's. She felt poorly and ultimately self dced her flecainide and beta blocker. She wore a cardiac monitor and called our office yesterday and was noted on the cardia monitor to have a narrow QRS tachy at 240/min. She was treated with AV nodal blocking drugs and feels better. She presents today to discuss her treatment options. She denies syncope. Allergies  Allergen Reactions  . Advil [Ibuprofen]     GI upset  . Aleve [Naproxen Sodium]     GI upset  . Aspirin Nausea And Vomiting    Takes baby aspirin qod without problems  . Atorvastatin     Leg cramps  . Celebrex [Celecoxib]     GI upset  . Diclofenac     GI upset  . Doxycycline     Headache  . Metoclopramide Hcl     REACTION: heart palps  . Nsaids Other (See Comments)    Tears my stomach up   . Other     Antihistamine - increase BP  . Pravastatin     Leg cramps  . Timolol     Dropped HR     Current Outpatient Prescriptions  Medication Sig Dispense Refill  . acetaminophen (TYLENOL) 500 MG tablet Take 500 mg by mouth as needed.    Marland Kitchen apixaban (ELIQUIS) 5 MG TABS tablet One twice daily for anticoagulation 60 tablet 5  . bimatoprost (LUMIGAN) 0.01 % SOLN Instill 1 drop into both eyes once daily in the evening    . BIOTIN 5000 PO Take 1 capsule by mouth daily.    . Cholecalciferol (VITAMIN D) 2000 UNITS tablet Take 2,000 Units by mouth daily.    Marland Kitchen diltiazem (CARDIZEM) 30 MG tablet Take 1 tablet by mouth every 6 hours as needed for fast heart rates 30 tablet 0  . dorzolamide (TRUSOPT) 2 % ophthalmic solution Place 1 drop into both eyes 3 (three) times daily.     . furosemide (LASIX) 40 MG tablet Take 40 mg by mouth as directed.     Marland Kitchen  glucosamine-chondroitin 500-400 MG tablet Take 2 tablets by mouth daily.     Marland Kitchen KETOPROFEN EX Apply 1 application topically every evening. 20% cream; PRN for pain.    . methylcellulose (ARTIFICIAL TEARS) 1 % ophthalmic solution Place 1 drop into both eyes daily as needed (for dry eyes).     . pantoprazole (PROTONIX) 40 MG tablet TAKE 1 TABLET BY MOUTH NIGHTLY TO REDUCE STOMACH ACID 30 tablet 5  . traZODone (DESYREL) 50 MG tablet Take 25 mg by mouth at bedtime.    . valsartan-hydrochlorothiazide (DIOVAN-HCT) 320-25 MG tablet TAKE 1 TABLET BY MOUTH EVERY DAY 30 tablet 5  . vitamin E 400 UNIT capsule Take 400 Units by mouth daily.     No current facility-administered medications for this visit.     Past Medical History  Diagnosis Date  . Anxiety state, unspecified   . Backache, unspecified   . Diverticulosis of colon (without mention of hemorrhage)   . Stricture and stenosis of esophagus   . Esophageal reflux   . Enthesopathy of hip region   . Diaphragmatic hernia without mention of obstruction or gangrene   . Pain in joint, pelvic region  and thigh   . Pure hypercholesterolemia   . Unspecified essential hypertension   . Elevated blood pressure reading without diagnosis of hypertension   . Pain in limb   . Other dysphagia   . Cramp of limb   . Chest pain, unspecified   . Other nonspecific abnormal serum enzyme levels   . Hepatitis, unspecified   . Disturbance of skin sensation   . Unspecified vitamin D deficiency   . Pain in joint, hand   . Other abnormal blood chemistry   . Pain in joint, ankle and foot   . Plantar fascial fibromatosis   . Unspecified hypothyroidism   . Asymptomatic varicose veins   . Allergic rhinitis due to pollen   . Myalgia and myositis, unspecified   . Pain in joint, lower leg   . Sciatica   . Reflux esophagitis   . Anxiety state, unspecified   . Depressive disorder, not elsewhere classified   . Other specified cardiac dysrhythmias(427.89)   . Other  malaise and fatigue   . Insomnia, unspecified   . Carpal tunnel syndrome   . Spinal stenosis, unspecified region other than cervical   . Lumbago   . Symptomatic menopausal or female climacteric states   . Unspecified essential hypertension   . Headache(784.0)   . Other and unspecified hyperlipidemia   . Obesity, unspecified   . Diaphragmatic hernia without mention of obstruction or gangrene   . Peptic ulcer, unspecified site, unspecified as acute or chronic, without mention of hemorrhage, perforation, or obstruction     ROS:   All systems reviewed and negative except as noted in the HPI.   Past Surgical History  Procedure Laterality Date  . Cholecystectomy  1989    DR BOWMAN  . Abdominal hysterectomy  1979  . Knee surgery  2003  . Breast biopsy      twice  . Excision of actinic keratosis      DR LUPTON   . O.s. cataract removed  08/03/1999    DR EPES   . O.d. cataract removed  2000    DR EPES  . Laser for cloudy cap left eye  03/2006    DR DIGBY  . Replacement total knee Right 04/2006    DR RENDALL  . Replacement total knee Left 04/2004    DR RENDALL  . Knee arthroscopy Right 06-26-13  . Total knee arthroplasty Right 07/04/2013    Procedure: RIGHT TOTAL KNEE ARTHROPLASTY;  Surgeon: Mcarthur Rossetti, MD;  Location: [facility];  Service: Orthopedics;  Laterality: Right;  . Knee closed reduction Right 10/23/2013    Procedure: CLOSED MANIPULATION RIGHT KNEE;  Surgeon: Mcarthur Rossetti, MD;  Location: [facility];  Service: Orthopedics;  Laterality: Right;  . Colonoscopy  1988    Normal   . Cardioversion N/A 04/26/2016    Procedure: CARDIOVERSION;  Surgeon: Pixie Casino, MD;  Location: [facility];  Service: Cardiovascular;  Laterality: N/A;     Family History  Problem Relation Age of Onset  . Ovarian cancer Mother   . Uterine cancer Mother   . Heart disease Father   . Cerebrovascular Accident Mother   . Hypertension Brother   . Obesity Daughter      Social  History   Social History  . Marital Status: Married    Spouse Name: N/A  . Number of Children: N/A  . Years of Education: N/A   Occupational History  . Not on file.   Social History Main Topics  .  Smoking status: Never Smoker   . Smokeless tobacco: Never Used  . Alcohol Use: No  . Drug Use: No  . Sexual Activity: Not Currently   Other Topics Concern  . Not on file   Social History Narrative     BP 120/88 mmHg  Pulse 131  Ht 5\' 2"  (1.575 m)  Wt 182 lb 12.8 oz (82.918 kg)  BMI 33.43 kg/m2  SpO2 94%  Physical Exam:  Well appearing 82 yo woman, NAD HEENT: Unremarkable Neck:  6 cm JVD, no thyromegally Lymphatics:  No adenopathy Back:  No CVA tenderness Lungs:  Clear with no wheezes HEART:  Regular tachy rhythm, no murmurs, no rubs, no clicks Abd:  soft, positive bowel sounds, no organomegally, no rebound, no guarding Ext:  2 plus pulses, no edema, no cyanosis, no clubbing Skin:  No rashes no nodules Neuro:  CN II through XII intact, motor grossly intact  EKG - atypical atrial flutter with a RVR (2:1 AV conduction)   Assess/Plan: 1. Atrial flutter with a RVR including 1:1 AV conduction - I have asked her in the short term to uptitrate her calcium channel blockers. 2. Sinus node dysfunction - on flecainide, she has been very slow. I have recommended she undergo PPM insertion followed by re-initiation of her flecainide and beta blocker.  3. Coags - she will hold one dose of Eliquis prior to PPM insertion. 4. HTN - her blood pressure is well controlled.  Mikle Bosworth.D.

## 2016-06-21 NOTE — Discharge Summary (Signed)
ELECTROPHYSIOLOGY PROCEDURE DISCHARGE SUMMARY    Patient ID: [name],  MRN: [id number], DOB/AGE: [birth year] 82 y.o.  Admit date: 06/21/2016 Discharge date: 06/22/16  Primary Care Physician: Estill Dooms, MD Primary Cardiologist: Dr. Martinique Electrophysiologist: Dr. Lovena Le  Primary Discharge Diagnosis:  1. Tachy-brady syndrome       S/p PPM this admission  Secondary Discharge Diagnosis:  1. AFlutter (atypical)     CHA2DS2Vasc is at least 4 on Eliquis 2. HTN  Allergies  Allergen Reactions  . Advil [Ibuprofen]     GI upset  . Aleve [Naproxen Sodium]     GI upset  . Aspirin Nausea And Vomiting    Takes baby aspirin qod without problems  . Atorvastatin     Leg cramps  . Celebrex [Celecoxib]     GI upset  . Diclofenac     GI upset  . Doxycycline     Headache  . Metoclopramide Hcl     REACTION: heart palps  . Nsaids Other (See Comments)    Tears my stomach up   . Other     Antihistamine - increase BP  . Pravastatin     Leg cramps  . Timolol     Dropped HR     Procedures This Admission:  1.  Implantation of a STJ dual chamber PPM on 06/21/16 by Dr Lovena Le.  The patient received a St. Jude (serial number [id number]) pacemaker, withSt. Jude (serial number [id number]) right atrial lead and a St. Jude (serial number [id number]) right ventricular lead.  There were no immediate post procedure complications. 2.  CXR on 06/22/16 demonstrated no pneumothorax status post device implantation.   Brief HPI: [name] is a 82 y.o. female was referred to electrophysiology in the outpatient setting for consideration of PPM implantation.  Past medical history includes tachy/brady syndrome, PAFlutter, and HTN.  Risks, benefits, and alternatives to PPM implantation were reviewed with the patient who wished to proceed.   Hospital Course:  The patient was admitted and underwent implantation of a PPM with details as outlined above. She was monitored on telemetry overnight  which demonstrated Aflutter, 80's.  Left chest was without hematoma or ecchymosis.  The device was interrogated and found to be functioning normally.  CXR was obtained and demonstrated no pneumothorax status post device implantation.  Wound care, arm mobility, and restrictions were reviewed with the patient.  Metoprolol was restarted at 50mg  BID, her Eliquis resumed.  The patient was examined by Dr. Lovena Le and considered stable for discharge to home.    Physical Exam: Filed Vitals:   06/21/16 1900 06/21/16 2005 06/22/16 0125 06/22/16 0750  BP: 137/60 121/66 150/73 136/77  Pulse: 61 63 70 86  Temp:  98 F (36.7 C) 97.5 F (36.4 C) 97.6 F (36.4 C)  TempSrc:  Oral Oral Oral  Resp: 15 16 12 16   Height:      Weight:   176 lb 5.9 oz (80 kg)   SpO2: 97% 97% 95% 95%    GEN- The patient is well appearing, alert and oriented x 3 today, ambulating in the room without difficulty HEENT: normocephalic, atraumatic; sclera clear, conjunctiva pink; hearing intact; oropharynx clear; neck supple, no JVP Lungs- Clear to ausculation bilaterally, normal work of breathing.  No wheezes, rales, rhonchi Heart- Regular rate and rhythm, no murmurs, rubs or gallops, PMI not laterally displaced GI- soft, non-tender, non-distended Extremities- no clubbing, cyanosis, or edema MS- no significant deformity or atrophy Skin- warm and dry, no  rash or lesion, left chest without hematoma/ecchymosis Psych- euthymic mood, full affect Neuro- no gross deficits   Labs:   Lab Results  Component Value Date   WBC 7.0 06/15/2016   HGB 13.7 06/15/2016   HCT 41.6 06/15/2016   MCV 89.1 06/15/2016   PLT 201 06/15/2016     Recent Labs Lab 06/15/16 1400  NA 138  K 4.0  CL 102  CO2 26  BUN 21  CREATININE 0.85  CALCIUM 9.3  GLUCOSE 101*    Discharge Medications:    Medication List    TAKE these medications        acetaminophen 500 MG tablet  Commonly known as:  TYLENOL  Take 500 mg by mouth every 6 (six)  hours as needed for mild pain.     antiseptic oral rinse Liqd  15 mLs by Mouth Rinse route daily as needed for dry mouth.     apixaban 5 MG Tabs tablet  Commonly known as:  ELIQUIS  One twice daily for anticoagulation     bimatoprost 0.01 % Soln  Commonly known as:  LUMIGAN  Instill 1 drop into both eyes once daily in the evening     BIOTIN 5000 PO  Take 1 capsule by mouth daily.     diltiazem 30 MG tablet  Commonly known as:  CARDIZEM  Take 1 tablet by mouth every 6 hours as needed for fast heart rates     dorzolamide 2 % ophthalmic solution  Commonly known as:  TRUSOPT  Place 1 drop into both eyes 3 (three) times daily.     KETOPROFEN EX  Apply 1 application topically daily as needed (pain). 20% cream; PRN for pain.     methylcellulose 1 % ophthalmic solution  Commonly known as:  ARTIFICIAL TEARS  Place 1 drop into both eyes daily as needed (for dry eyes).     metoprolol 50 MG tablet  Commonly known as:  LOPRESSOR  Take 1 tablet (50 mg total) by mouth 2 (two) times daily.     pantoprazole 40 MG tablet  Commonly known as:  PROTONIX  TAKE 1 TABLET BY MOUTH NIGHTLY TO REDUCE STOMACH ACID     traZODone 50 MG tablet  Commonly known as:  DESYREL  Take 25 mg by mouth at bedtime.     valsartan-hydrochlorothiazide 320-25 MG tablet  Commonly known as:  DIOVAN-HCT  TAKE 1 TABLET BY MOUTH EVERY DAY     Vitamin D 2000 units tablet  Take 2,000 Units by mouth daily.        Disposition:  Home Discharge Instructions    Diet - low sodium heart healthy    Complete by:  As directed      Increase activity slowly    Complete by:  As directed           Follow-up Information    Follow up with [facility] On 07/03/2016.   Specialty:  Cardiology   Why:  12:00PM (noon), wound check   Contact information:   [address], [place] [phone]      Follow up with Cristopher Peru, MD On 09/20/2016.   Specialty:   Cardiology   Why:  12:00PM, (noon)   Contact information:   [address] [address] [phone]       Duration of Discharge Encounter: Greater than 30 minutes including physician time.  Venetia Night, PA-C 06/22/2016 8:42 AM  EP Attending  Patient seen and examined. Agree with the findings as noted above. The patient is doing well after PPM insertion and has now had a controlled VR in atrial fib/flutter. She will restart her Eliquis and usual followup is anticipated. CXR looks good with A and V lead in the RA and RV respectively.  Mikle Bosworth.D.

## 2016-06-21 NOTE — Interval H&P Note (Signed)
History and Physical Interval Note:  06/21/2016 9:09 AM  [name]  has presented today for surgery, with the diagnosis of a/flutter  The various methods of treatment have been discussed with the patient and family. After consideration of risks, benefits and other options for treatment, the patient has consented to  Procedure(s): Pacemaker Implant (N/A) as a surgical intervention .  The patient's history has been reviewed, patient examined, no change in status, stable for surgery.  I have reviewed the patient's chart and labs.  Questions were answered to the patient's satisfaction.     Cristopher Peru

## 2016-06-21 NOTE — Discharge Instructions (Signed)
° ° °  Supplemental Discharge Instructions for  Pacemaker/Defibrillator Patients  Activity No heavy lifting or vigorous activity with your left/right arm for 6 to 8 weeks.  Do not raise your left/right arm above your head for one week.  Gradually raise your affected arm as drawn below.             06/25/16                       06/26/16                      06/27/16                     06/28/16 __  NO DRIVING for 1 week    ; you may begin driving on  [id number]   .  WOUND CARE - Keep the wound area clean and dry.  Do not get this area wet for one week. No showers for one week; you may shower on 06/28/16    . - The tape/steri-strips on your wound will fall off; do not pull them off.  No bandage is needed on the site.  DO  NOT apply any creams, oils, or ointments to the wound area. - If you notice any drainage or discharge from the wound, any swelling or bruising at the site, or you develop a fever > 101? F after you are discharged home, call the office at once.  Special Instructions - You are still able to use cellular telephones; use the ear opposite the side where you have your pacemaker/defibrillator.  Avoid carrying your cellular phone near your device. - When traveling through airports, show security personnel your identification card to avoid being screened in the metal detectors.  Ask the security personnel to use the hand wand. - Avoid arc welding equipment, MRI testing (magnetic resonance imaging), TENS units (transcutaneous nerve stimulators).  Call the office for questions about other devices. - Avoid electrical appliances that are in poor condition or are not properly grounded. - Microwave ovens are safe to be near or to operate.  Additional information for defibrillator patients should your device go off: - If your device goes off ONCE and you feel fine afterward, notify the device clinic nurses. - If your device goes off ONCE and you do not feel well afterward, call 911. - If your device  goes off TWICE, call 911. - If your device goes off THREE times in one day, call 911.  DO NOT DRIVE YOURSELF OR A FAMILY MEMBER WITH A DEFIBRILLATOR TO THE HOSPITAL--CALL 911.

## 2016-06-21 NOTE — Care Management Note (Signed)
Case Management Note  Patient Details  Name: [name] MRN: [id number] Date of Birth: 27-Jun-1931  Subjective/Objective:                 OIB for pacemaker insertion.   Action/Plan:  Will DC to home.  Expected Discharge Date:                  Expected Discharge Plan:  Home/Self Care  In-House Referral:     Discharge planning Services  CM Consult  Post Acute Care Choice:  NA Choice offered to:     DME Arranged:    DME Agency:     HH Arranged:    [place] Agency:     Status of Service:  Completed, signed off  If discussed at [employer] of Stay Meetings, dates discussed:    Additional Comments:  Carles Collet, RN 06/21/2016, 2:15 PM

## 2016-06-22 ENCOUNTER — Ambulatory Visit (HOSPITAL_COMMUNITY): Payer: Medicare Other

## 2016-06-22 ENCOUNTER — Other Ambulatory Visit: Payer: Self-pay | Admitting: Internal Medicine

## 2016-06-22 DIAGNOSIS — J90 Pleural effusion, not elsewhere classified: Secondary | ICD-10-CM | POA: Diagnosis not present

## 2016-06-22 DIAGNOSIS — Z7901 Long term (current) use of anticoagulants: Secondary | ICD-10-CM | POA: Diagnosis not present

## 2016-06-22 DIAGNOSIS — I495 Sick sinus syndrome: Secondary | ICD-10-CM

## 2016-06-22 DIAGNOSIS — Z950 Presence of cardiac pacemaker: Secondary | ICD-10-CM | POA: Diagnosis not present

## 2016-06-22 DIAGNOSIS — I10 Essential (primary) hypertension: Secondary | ICD-10-CM | POA: Diagnosis not present

## 2016-06-22 DIAGNOSIS — I484 Atypical atrial flutter: Secondary | ICD-10-CM | POA: Diagnosis not present

## 2016-06-22 DIAGNOSIS — K210 Gastro-esophageal reflux disease with esophagitis: Secondary | ICD-10-CM | POA: Diagnosis not present

## 2016-06-22 DIAGNOSIS — Z79899 Other long term (current) drug therapy: Secondary | ICD-10-CM | POA: Diagnosis not present

## 2016-06-22 DIAGNOSIS — E559 Vitamin D deficiency, unspecified: Secondary | ICD-10-CM | POA: Diagnosis not present

## 2016-06-22 DIAGNOSIS — Z96651 Presence of right artificial knee joint: Secondary | ICD-10-CM | POA: Diagnosis not present

## 2016-06-22 MED ORDER — METOPROLOL TARTRATE 50 MG PO TABS: 50.0000 mg | ORAL_TABLET | Freq: Two times a day (BID) | ORAL | Status: DC

## 2016-06-22 NOTE — Care Management Note (Signed)
Case Management Note  Patient Details  Name: [name] MRN: [id number] Date of Birth: 11-10-1931  Subjective/Objective:                 OIB for pacemaker insertion.   Action/Plan:  Will DC to home.  Expected Discharge Date:                  Expected Discharge Plan:  Home/Self Care  In-House Referral:     Discharge planning Services  CM Consult  Post Acute Care Choice:  NA Choice offered to:     DME Arranged:    DME Agency:     HH Arranged:    [place] Agency:     Status of Service:  Completed, signed off  If discussed at [employer] of Stay Meetings, dates discussed:    Additional Comments: Pt discharge home today.  Pt on Eliquis prior to this admit.  NO CM needs determined prior to discharge Maryclare Labrador, RN 06/22/2016, 9:23 AM

## 2016-06-26 ENCOUNTER — Ambulatory Visit: Payer: Medicare Other | Admitting: Cardiology

## 2016-06-30 ENCOUNTER — Telehealth: Payer: Self-pay | Admitting: Internal Medicine

## 2016-06-30 NOTE — Telephone Encounter (Signed)
Called pt back and informed her that her PPM is working and that HR fluctuations are normal for her d/t aflutter. Pt stated that her BP had been has high as [id number] systolic,pt stated that her BP came down to [id number] systolic after taking medication. Informed pt that this was normal. Pt educated about sitting on side of bed before standing up, slowly getting up from a sitting position . Encouraged pt to call back if any questions or worsening symptoms.

## 2016-06-30 NOTE — Telephone Encounter (Signed)
New Message  Pt requested to speak w/ rN- stated that since implant procedure- pt has c/o dizziness, high bp, and some HR fluctuations-pt did not have readings. Please call back and discuss.

## 2016-07-03 ENCOUNTER — Ambulatory Visit: Payer: Medicare Other

## 2016-07-04 ENCOUNTER — Ambulatory Visit (INDEPENDENT_AMBULATORY_CARE_PROVIDER_SITE_OTHER): Payer: Medicare Other | Admitting: *Deleted

## 2016-07-04 ENCOUNTER — Encounter: Payer: Self-pay | Admitting: Internal Medicine

## 2016-07-04 ENCOUNTER — Telehealth: Payer: Self-pay | Admitting: *Deleted

## 2016-07-04 DIAGNOSIS — I484 Atypical atrial flutter: Secondary | ICD-10-CM | POA: Diagnosis not present

## 2016-07-04 LAB — CUP PACEART INCLINIC DEVICE CHECK
Battery Remaining Longevity: 126
Battery Voltage: 2.99 V
Date Time Interrogation Session: 20170711131845
Implantable Lead Implant Date: 20170628
Implantable Lead Location: 753860
Lead Channel Impedance Value: 475 Ohm
Lead Channel Pacing Threshold Amplitude: 0.75 V
Lead Channel Pacing Threshold Pulse Width: 0.4 ms
Lead Channel Setting Pacing Amplitude: 3.5 V
Lead Channel Setting Pacing Amplitude: 3.5 V
Lead Channel Setting Pacing Pulse Width: 0.4 ms
MDC IDC LEAD IMPLANT DT: 20170628
MDC IDC LEAD LOCATION: 753859
MDC IDC MSMT LEADCHNL RA SENSING INTR AMPL: 3.6 mV
MDC IDC MSMT LEADCHNL RV IMPEDANCE VALUE: 575 Ohm
MDC IDC MSMT LEADCHNL RV PACING THRESHOLD AMPLITUDE: 0.75 V
MDC IDC MSMT LEADCHNL RV PACING THRESHOLD PULSEWIDTH: 0.4 ms
MDC IDC MSMT LEADCHNL RV SENSING INTR AMPL: 5.7 mV
MDC IDC PG SERIAL: 7910313
MDC IDC SET LEADCHNL RV SENSING SENSITIVITY: 2 mV
MDC IDC STAT BRADY RA PERCENT PACED: 0 %
MDC IDC STAT BRADY RV PERCENT PACED: 9.3 %

## 2016-07-04 NOTE — Telephone Encounter (Signed)
Medication approved: Alprazolam 0.25 mg(30 tablets) 1 tablet at bedtime if needed for rest or anxiety

## 2016-07-04 NOTE — Progress Notes (Signed)
Wound check appointment. Steri-strips removed. Wound without redness or edema. Incision edges approximated, wound well healed. Normal device function. Threshold, sensing, and impedances consistent with implant measurements. Device programmed at 3.5V for extra safety margin until 3 month visit. Histogram distribution appropriate for patient and level of activity. 96% AF/AFL burden + Eliquis. (1) high ventricular rates noted--AFL/RVR. Patient c/o "activity" (palpitations) at times, but stated that she was scared to take Dilt prn as Rx'd. I encouraged patient to take the medication as Rx'd when she experiences sx's. I also counseled patient about the importance of taking her pulse radially to get accurate readings. This was demonstrated for patient and patient voiced understanding. Patient educated about wound care, arm mobility, and lifting restrictions. Handout was also provided for patient. ROV in 3 months with GT.

## 2016-07-04 NOTE — Telephone Encounter (Signed)
Patient called and stated that she would like Alprazolam .25mg  called to her pharmacy. Medication is not in patient's current medication list. She stated that she spoke with you about this at her previous appointment. Please Advise.

## 2016-07-05 MED ORDER — ALPRAZOLAM 0.25 MG PO TABS: ORAL_TABLET | ORAL | Status: DC

## 2016-07-05 NOTE — Telephone Encounter (Signed)
Patient notified and agreed. Printed for signature and will fax to [place].

## 2016-07-13 ENCOUNTER — Encounter: Payer: Self-pay | Admitting: Internal Medicine

## 2016-07-17 ENCOUNTER — Encounter: Payer: Self-pay | Admitting: Nurse Practitioner

## 2016-07-17 ENCOUNTER — Telehealth: Payer: Self-pay | Admitting: Internal Medicine

## 2016-07-17 ENCOUNTER — Ambulatory Visit (INDEPENDENT_AMBULATORY_CARE_PROVIDER_SITE_OTHER): Payer: Medicare Other | Admitting: Nurse Practitioner

## 2016-07-17 VITALS — BP 128/82 | HR 96 | Temp 97.9°F | Resp 17 | Ht 62.0 in | Wt 187.4 lb

## 2016-07-17 DIAGNOSIS — R600 Localized edema: Secondary | ICD-10-CM | POA: Diagnosis not present

## 2016-07-17 NOTE — Telephone Encounter (Deleted)
error 

## 2016-07-17 NOTE — Progress Notes (Signed)
PCP: Veronica Hubert, MD  Advanced Directive information Does patient have an advance directive?: Yes, Type of Advance Directive: [place], Does patient want to make changes to advanced directive?: No - Patient declined  Allergies  Allergen Reactions  . Advil [Ibuprofen]     GI upset  . Aleve [Naproxen Sodium]     GI upset  . Aspirin Nausea And Vomiting    Takes baby aspirin qod without problems  . Atorvastatin     Leg cramps  . Celebrex [Celecoxib]     GI upset  . Diclofenac     GI upset  . Doxycycline     Headache  . Metoclopramide Hcl     REACTION: heart palps  . Nsaids Other (See Comments)    Tears my stomach up   . Other     Antihistamine - increase BP  . Pravastatin     Leg cramps  . Timolol     Dropped HR    Chief Complaint  Patient presents with  . Acute Visit    Left elbow/arm swelling since Saturday. Had a pacemaker placed 3-4 weeks ago.      HPI: Patient is a 82 y.o. female seen in the office today due to left elbow swelling.  Pt had a pacemaker placed last 4 weeks and was warned about swelling in her left side so she wanted to be careful and have it checked out.  Has had increase fluid in her legs, had to take fluid pill this morning and swelling in LE and arm has improved at this time  Review of Systems:  Review of Systems  Constitutional: Negative for activity change, appetite change, fatigue, fever and unexpected weight change.  Respiratory: Negative for shortness of breath.   Cardiovascular: Positive for leg swelling. Negative for chest pain.  Musculoskeletal: Negative for joint swelling and myalgias.    Past Medical History:  Diagnosis Date  . Allergic rhinitis due to pollen   . Anxiety state, unspecified   . Arthritis    "right knee" (06/21/2016)  . Asymptomatic varicose veins   . Carpal tunnel syndrome   . Chest pain, unspecified   . Chronic lower back pain    "on the left side" (06/21/2016)  . Cramp of limb   .  Depressive disorder, not elsewhere classified   . Diaphragmatic hernia without mention of obstruction or gangrene   . Diaphragmatic hernia without mention of obstruction or gangrene   . Disturbance of skin sensation   . Diverticulosis of colon (without mention of hemorrhage)   . Enthesopathy of hip region   . Esophageal reflux   . Hepatitis, unspecified   . History of blood transfusion    "w/both knee replacements"  . History of duodenal ulcer   . Insomnia, unspecified   . Lumbago   . Migraine    "none since ~ 1990" (06/21/2016)  . Myalgia and myositis, unspecified   . Obesity, unspecified   . Other abnormal blood chemistry   . Other dysphagia   . Other malaise and fatigue   . Other nonspecific abnormal serum enzyme levels   . Other specified cardiac dysrhythmias(427.89)   . Pain in joint, ankle and foot   . Pain in joint, hand   . Pain in joint, lower leg   . Pain in joint, pelvic region and thigh   . Pain in limb   . Plantar fascial fibromatosis   . Presence of permanent cardiac pacemaker   . Reflux  esophagitis   . Sciatica   . Spinal stenosis, unspecified region other than cervical   . Stricture and stenosis of esophagus   . Symptomatic menopausal or female climacteric states   . Unspecified essential hypertension   . Unspecified essential hypertension   . Unspecified vitamin D deficiency    Past Surgical History:  Procedure Laterality Date  . BREAST BIOPSY Left 1990s X 2  . CARDIOVERSION N/A 04/26/2016   Procedure: CARDIOVERSION;  Surgeon: Pixie Casino, MD;  Location: [facility];  Service: Cardiovascular;  Laterality: N/A;  . CATARACT EXTRACTION W/ INTRAOCULAR LENS IMPLANT Left 08/03/1999   DR EPES   . CATARACT EXTRACTION W/ INTRAOCULAR LENS IMPLANT Right 2000   DR EPES  . [place]  . COLONOSCOPY  1988   Normal   . EP IMPLANTABLE DEVICE N/A 06/21/2016   Procedure: Pacemaker Implant;  Surgeon: Evans Lance, MD;  Location: [facility];  Service: Cardiovascular;  Laterality: N/A;  . ESOPHAGOGASTRODUODENOSCOPY (EGD) WITH ESOPHAGEAL DILATION  ~ 1982   Dr. Sharlett Iles  . EXCISION OF ACTINIC KERATOSIS     DR LUPTON   . EYE SURGERY    . INSERT / REPLACE / REMOVE PACEMAKER    . JOINT REPLACEMENT    . KNEE ARTHROSCOPY Left 2003  . KNEE ARTHROSCOPY Right 06-26-13  . KNEE CLOSED REDUCTION Right 10/23/2013   Procedure: CLOSED MANIPULATION RIGHT KNEE;  Surgeon: Mcarthur Rossetti, MD;  Location: [facility];  Service: Orthopedics;  Laterality: Right;  . LASER FOR CLOUDY CAP LEFT EYE Left 03/2006   DR DIGBY  . TOTAL KNEE ARTHROPLASTY Left 04/2004   DR RENDALL  . TOTAL KNEE ARTHROPLASTY Right 07/04/2013   Procedure: RIGHT TOTAL KNEE ARTHROPLASTY;  Surgeon: Mcarthur Rossetti, MD;  Location: [facility];  Service: Orthopedics;  Laterality: Right;  Marland Kitchen VAGINAL HYSTERECTOMY  1979   Social History:   reports that she has never smoked. She has never used smokeless tobacco. She reports that she does not drink alcohol or use drugs.  Family History  Problem Relation Age of Onset  . Ovarian cancer Mother   . Uterine cancer Mother   . Cerebrovascular Accident Mother   . Heart disease Father   . Hypertension Brother   . Obesity Daughter     Medications: Patient's Medications  New Prescriptions   No medications on file  Previous Medications   ACETAMINOPHEN (TYLENOL) 500 MG TABLET    Take 500 mg by mouth every 6 (six) hours as needed for mild pain.    ALPRAZOLAM (XANAX) 0.25 MG TABLET    Take one tablet by mouth at bedtime as needed for rest or anxiety   ANTISEPTIC ORAL RINSE (BIOTENE) LIQD    15 mLs by Mouth Rinse route daily as needed for dry mouth.   APIXABAN (ELIQUIS) 5 MG TABS TABLET    One twice daily for anticoagulation   BIMATOPROST (LUMIGAN) 0.01 % SOLN    Instill 1 drop into both eyes once daily in the evening   BIOTIN 5000 PO    Take 1 capsule by mouth daily.   CHOLECALCIFEROL (VITAMIN D) 2000 UNITS TABLET     Take 2,000 Units by mouth daily.   DILTIAZEM (CARDIZEM) 30 MG TABLET    Take 1 tablet by mouth every 6 hours as needed for fast heart rates   DORZOLAMIDE (TRUSOPT) 2 % OPHTHALMIC SOLUTION    Place 1 drop into both eyes 3 (three) times daily.  FUROSEMIDE (LASIX) 20 MG TABLET    Take 20 mg by mouth as needed for edema.    KETOPROFEN EX    Apply 1 application topically daily as needed (pain). 20% cream; PRN for pain.   METHYLCELLULOSE (ARTIFICIAL TEARS) 1 % OPHTHALMIC SOLUTION    Place 1 drop into both eyes daily as needed (for dry eyes).    METOPROLOL TARTRATE (LOPRESSOR) 50 MG TABLET    Take 1 tablet (50 mg total) by mouth 2 (two) times daily.   PANTOPRAZOLE (PROTONIX) 40 MG TABLET    TAKE 1 TABLET BY MOUTH NIGHTLY TO REDUCE STOMACH ACID   TRAZODONE (DESYREL) 50 MG TABLET    Take 25 mg by mouth at bedtime.   VALSARTAN-HYDROCHLOROTHIAZIDE (DIOVAN-HCT) 320-25 MG TABLET    TAKE 1 TABLET BY MOUTH EVERY DAY  Modified Medications   No medications on file  Discontinued Medications   No medications on file     Physical Exam:  Vitals:   07/17/16 1630  BP: 128/82  Pulse: 96  Resp: 17  Temp: 97.9 F (36.6 C)  TempSrc: Oral  SpO2: 97%  Weight: 187 lb 6.4 oz (85 kg)  Height: 5\' 2"  (1.575 m)   Body mass index is 34.28 kg/m.  Physical Exam  Constitutional: She is oriented to person, place, and time. No distress.  Obese.  HENT:  Head: Normocephalic and atraumatic.  Right Ear: External ear normal.  Left Ear: External ear normal.  Eyes: Conjunctivae and EOM are normal. Pupils are equal, round, and reactive to light. Left eye exhibits no discharge. No scleral icterus.  Neck: Normal range of motion. Neck supple.  Cardiovascular: Normal rate, regular rhythm, normal heart sounds and intact distal pulses.   Pulmonary/Chest: Effort normal and breath sounds normal.  Abdominal: Soft. Bowel sounds are normal. She exhibits no distension. There is no tenderness.  Musculoskeletal: She exhibits no  edema.  Right knee S/P TKR.   Neurological: She is alert and oriented to person, place, and time.  Psychiatric: She has a normal mood and affect. Her behavior is normal. Judgment and thought content normal.    Labs reviewed: Basic Metabolic Panel:  Recent Labs  07/26/15 0939  03/27/16 0853 04/25/16 1139 04/26/16 0927 06/15/16 1400  NA 143  < > 141 136 142 138  K 3.7  < > 3.9 3.6 3.6 4.0  CL 104  < > 103 102 104 102  CO2 25  < > 24 28  --  26  GLUCOSE 92  < > 93 99 95 101*  BUN 15  < > 17 20 19 21   CREATININE 0.68  < > 0.70 0.72 0.70 0.85  CALCIUM 9.0  < > 9.0 8.7  --  9.3  TSH 3.870  --   --   --   --   --   < > = values in this interval not displayed. Liver Function Tests:  Recent Labs  07/26/15 0939 11/26/15 1017 03/27/16 0853  AST 21 19 20   ALT 17 17 16   ALKPHOS 55 63 64  BILITOT 0.3 0.4 0.3  PROT 6.3 6.5 6.6  ALBUMIN 3.9 3.7 3.6   No results for input(s): LIPASE, AMYLASE in the last 8760 hours. No results for input(s): AMMONIA in the last 8760 hours. CBC:  Recent Labs  04/25/16 1139 04/26/16 0927 06/15/16 1400  WBC 5.3  --  7.0  NEUTROABS 2,491  --  3,710  HGB 12.3 14.3 13.7  HCT 38.0 42.0 41.6  MCV 89.4  --  89.1  PLT 172  --  201   Lipid Panel:  Recent Labs  07/26/15 0939 11/26/15 1017 03/27/16 0853  CHOL 151 200* 179  HDL 42 48 42  LDLCALC 80 127* 99  TRIG 143 127 190*  CHOLHDL 3.6 4.2 4.3   TSH:  Recent Labs  07/26/15 0939  TSH 3.870   A1C: Lab Results  Component Value Date   HGBA1C 5.6 03/27/2016     Assessment/Plan 1. Localized edema Minimal swelling to left arm that has improved throughout the day. No redness or tenderness. No evidence of DVT.  Reassurance given    [name]. [name]  [facility] [phone] 8 am - 5 pm) [phone] (after hours)

## 2016-08-09 ENCOUNTER — Encounter: Payer: Self-pay | Admitting: Internal Medicine

## 2016-08-10 ENCOUNTER — Ambulatory Visit: Payer: Medicare Other | Admitting: Cardiology

## 2016-08-10 ENCOUNTER — Telehealth: Payer: Self-pay | Admitting: *Deleted

## 2016-08-10 NOTE — Telephone Encounter (Signed)
[facility] requesting call back.  Gave device clinic phone number for return call.  Will advise patient of Dr. Tanna Furry recommendations to maintain current medication regimen and call if her symptoms recur.

## 2016-08-14 NOTE — Telephone Encounter (Signed)
Pt returning your call. Please call her back at [phone].

## 2016-08-14 NOTE — Telephone Encounter (Signed)
Spoke with patient.  Advised patient of Dr. Tanna Furry recommendations.  Patient reports that she had another "episode" of tachycardia on Tuesday evening while they had company over.  She reports that she didn't take the diltiazem because she is afraid it will harm her.  Reiterated Dr. Tanna Furry instructions and advised patient that her diltiazem is intended to be taken as needed if she is having a tachycardia episode.  Patient verbalizes understanding and states that she will try this next time.  Patient is aware of her appointment with Dr. Lovena Le on 09/20/16 at 12:00pm.  Patient is appreciative of call and denies additional questions or concerns at this time.

## 2016-08-16 ENCOUNTER — Encounter: Payer: Self-pay | Admitting: Gastroenterology

## 2016-08-18 ENCOUNTER — Telehealth: Payer: Self-pay

## 2016-08-18 NOTE — Telephone Encounter (Signed)
Received clearance form for tooth extraction and bone grafting from [place]'s office.Dr.Jordan advised ok to proceed with no special precautions.Form faxed to fax # [phone].

## 2016-08-24 ENCOUNTER — Telehealth: Payer: Self-pay | Admitting: *Deleted

## 2016-08-24 NOTE — Telephone Encounter (Signed)
Dr. Dorian Heckle office called back regarding a dental surgery clearance, wanting to see if Dr. Martinique could give clarification on what patient needs to do w/ her meds, specifically Eliquis in context of the upcoming planned bone grafting surgery. She acknowledged receipt of clearance letter but wants to make sure medications are addressed - there was no note regarding this.  Aware I will route to Dr. Martinique and Malachy Mood for review.  They can be reached at [phone] or by fax at [phone]

## 2016-08-24 NOTE — Telephone Encounter (Signed)
She may hold Eliquis for 2 days prior to oral surgery  Maija Biggers Martinique MD, [facility]

## 2016-08-25 HISTORY — PX: DENTAL SURGERY: SHX609

## 2016-08-25 NOTE — Telephone Encounter (Signed)
Spoke to patient Dr.Jordan's recommendations given.Spoke to Dr.[place]'s office Dr.Jordan's recommendations given too.Note faxed to Dr.[place] at fax # [phone].

## 2016-08-25 NOTE — Telephone Encounter (Signed)
If the surgeon is OK with staying on Eliquis then she should continue it.  Peter Martinique MD, [facility]

## 2016-08-25 NOTE — Telephone Encounter (Signed)
Spoke to patient she is concerned about stopping Eliquis.Stated oral surgeon Dr.[place] had originally told her he wanted her to stay on Eliquis.Stated she wanted to make sure with Dr.Jordan he wants her to stop for 2 days before 1 tooth extraction and bone grafting.Message sent to [place].

## 2016-08-25 NOTE — Telephone Encounter (Signed)
Recommendations relayed to dental surgery office - instructed to call back if questions. Provided my direct line.

## 2016-08-30 ENCOUNTER — Ambulatory Visit (INDEPENDENT_AMBULATORY_CARE_PROVIDER_SITE_OTHER): Payer: Medicare Other | Admitting: Internal Medicine

## 2016-08-30 ENCOUNTER — Encounter: Payer: Self-pay | Admitting: Internal Medicine

## 2016-08-30 VITALS — BP 112/74 | HR 104 | Temp 97.0°F | Ht 62.0 in | Wt 185.0 lb

## 2016-08-30 DIAGNOSIS — E669 Obesity, unspecified: Secondary | ICD-10-CM | POA: Diagnosis not present

## 2016-08-30 DIAGNOSIS — M7742 Metatarsalgia, left foot: Secondary | ICD-10-CM | POA: Insufficient documentation

## 2016-08-30 DIAGNOSIS — I10 Essential (primary) hypertension: Secondary | ICD-10-CM

## 2016-08-30 DIAGNOSIS — I481 Persistent atrial fibrillation: Secondary | ICD-10-CM

## 2016-08-30 DIAGNOSIS — R609 Edema, unspecified: Secondary | ICD-10-CM

## 2016-08-30 DIAGNOSIS — R739 Hyperglycemia, unspecified: Secondary | ICD-10-CM

## 2016-08-30 DIAGNOSIS — Z23 Encounter for immunization: Secondary | ICD-10-CM | POA: Diagnosis not present

## 2016-08-30 DIAGNOSIS — E7800 Pure hypercholesterolemia, unspecified: Secondary | ICD-10-CM

## 2016-08-30 DIAGNOSIS — Z950 Presence of cardiac pacemaker: Secondary | ICD-10-CM | POA: Diagnosis not present

## 2016-08-30 DIAGNOSIS — I4819 Other persistent atrial fibrillation: Secondary | ICD-10-CM

## 2016-08-30 NOTE — Patient Instructions (Signed)
Metatarsal pads may hel pain in the foot.

## 2016-08-30 NOTE — Progress Notes (Signed)
  Facility  [facility]    Place of Service:   OFFICE    Allergies  Allergen Reactions  . Advil [Ibuprofen]     GI upset  . Aleve [Naproxen Sodium]     GI upset  . Aspirin Nausea And Vomiting    Takes baby aspirin qod without problems  . Atorvastatin     Leg cramps  . Celebrex [Celecoxib]     GI upset  . Diclofenac     GI upset  . Doxycycline     Headache  . Metoclopramide Hcl     REACTION: heart palps  . Nsaids Other (See Comments)    Tears my stomach up   . Other     Antihistamine - increase BP  . Pravastatin     Leg cramps  . Timolol     Dropped HR    Chief Complaint  Patient presents with  . Medical Management of Chronic Issues    3 months blood pressure, A-Fib  . Other    Pacemaker 06/21/16    HPI:   Essential hypertension - controlled  Obese  unchanged  Edema, unspecified type - improved  Persistent atrial fibrillation (HCC) - continues in AF today  Cardiac pacemaker in situ - still tender in the left upper chest. "Sometimes it feels like it is moving".  Encounter for immunization - Plan: Flu Vaccine QUAD 36+ mos IM  Complains of tenderness across left metatarsal. Therapist had her doing exercises standing on her toes and she began having pain soon after that.   Medications: Patient's Medications  New Prescriptions   No medications on file  Previous Medications   ACETAMINOPHEN (TYLENOL) 500 MG TABLET    Take 500 mg by mouth every 6 (six) hours as needed for mild pain.    ALPRAZOLAM (XANAX) 0.25 MG TABLET    Take one tablet by mouth at bedtime as needed for rest or anxiety   ANTISEPTIC ORAL RINSE (BIOTENE) LIQD    15 mLs by Mouth Rinse route daily as needed for dry mouth.   APIXABAN (ELIQUIS) 5 MG TABS TABLET    One twice daily for anticoagulation   BIMATOPROST (LUMIGAN) 0.01 % SOLN    Instill 1 drop into both eyes once daily in the evening   BIOTIN 5000 PO    Take 1 capsule by mouth daily.   CHOLECALCIFEROL (VITAMIN D) 2000 UNITS TABLET    Take  2,000 Units by mouth daily.   DILTIAZEM (CARDIZEM) 30 MG TABLET    Take 1 tablet by mouth every 6 hours as needed for fast heart rates   DORZOLAMIDE (TRUSOPT) 2 % OPHTHALMIC SOLUTION    Place 1 drop into both eyes 3 (three) times daily.    FUROSEMIDE (LASIX) 20 MG TABLET    Take 20 mg by mouth as needed for edema.    KETOPROFEN EX    Apply 1 application topically daily as needed (pain). 20% cream; PRN for pain.   METHYLCELLULOSE (ARTIFICIAL TEARS) 1 % OPHTHALMIC SOLUTION    Place 1 drop into both eyes daily as needed (for dry eyes).    METOPROLOL TARTRATE (LOPRESSOR) 50 MG TABLET    Take 1 tablet (50 mg total) by mouth 2 (two) times daily.   PANTOPRAZOLE (PROTONIX) 40 MG TABLET    TAKE 1 TABLET BY MOUTH NIGHTLY TO REDUCE STOMACH ACID   TRAZODONE (DESYREL) 50 MG TABLET    Take 25 mg by mouth at bedtime.   VALSARTAN-HYDROCHLOROTHIAZIDE (DIOVAN-HCT) 320-25 MG TABLET      TAKE 1 TABLET BY MOUTH EVERY DAY  Modified Medications   No medications on file  Discontinued Medications   No medications on file    Review of Systems  Constitutional: Positive for activity change. Negative for appetite change, chills, diaphoresis, fatigue, fever and unexpected weight change.  HENT: Negative.   Eyes: Positive for discharge.       Corrective lenses.  Respiratory: Negative.   Cardiovascular: Positive for leg swelling. Negative for chest pain and palpitations.       Palpitations. Atrial fibrillation documented on EKG 03/29/16.  Gastrointestinal:       Frequent reflux and heartburn.  Endocrine:       History of elevations in glucose. Diet controlled.  Genitourinary: Positive for frequency.       Urinary leakage,  Musculoskeletal:       Chronic back pains. Right knee painful. Had surgery 07/04/13 by Dr. Blackman. Using tramadol. Having some left neck discomfort. Pain across the distal left metatarsals.  Skin: Negative.   Neurological: Positive for numbness. Negative for dizziness, tremors, seizures, syncope,  facial asymmetry, speech difficulty, weakness, light-headedness and headaches.       Episodes of numbness in the right hand.  Hematological: Negative.   Psychiatric/Behavioral: Negative.        Some difficulty falling asleep. Using less Ambien now.    Vitals:   08/30/16 1610  BP: 112/74  Pulse: (!) 104  Temp: 97 F (36.1 C)  TempSrc: Oral  SpO2: 97%  Weight: 185 lb (83.9 kg)  Height: 5' 2" (1.575 m)   Body mass index is 33.84 kg/m. Wt Readings from Last 3 Encounters:  08/30/16 185 lb (83.9 kg)  07/17/16 187 lb 6.4 oz (85 kg)  06/22/16 176 lb 5.9 oz (80 kg)      Physical Exam  Constitutional: She is oriented to person, place, and time. No distress.  Obese.  HENT:  Head: Normocephalic and atraumatic.  Right Ear: External ear normal.  Left Ear: External ear normal.  Eyes: Conjunctivae and EOM are normal. Pupils are equal, round, and reactive to light. Left eye exhibits no discharge. No scleral icterus.  Neck: Normal range of motion. Neck supple.  Cardiovascular: Normal rate, regular rhythm, normal heart sounds and intact distal pulses.   Pulmonary/Chest: Effort normal and breath sounds normal.  Pacemaker in left upper chest wall.  Abdominal: Soft. Bowel sounds are normal. She exhibits no distension. There is no tenderness.  Musculoskeletal: She exhibits no edema.  Right knee S/P TKR. Tenderness at the left metatarsal heads.   Neurological: She is alert and oriented to person, place, and time.  Psychiatric: She has a normal mood and affect. Her behavior is normal. Judgment and thought content normal.    Labs reviewed: Lab Summary Latest Ref Rng & Units 06/15/2016 04/26/2016 04/25/2016  Hemoglobin 11.7 - 15.5 g/dL 13.7 14.3 12.3  Hematocrit 35.0 - 45.0 % 41.6 42.0 38.0  White count 3.8 - 10.8 K/uL 7.0 (None) 5.3  Platelet count 140 - 400 K/uL 201 (None) 172  Sodium 135 - 146 mmol/L 138 142 136  Potassium 3.5 - 5.3 mmol/L 4.0 3.6 3.6  Calcium 8.6 - 10.4 mg/dL 9.3 (None)  8.7  Phosphorus - (None) (None) (None)  Creatinine 0.60 - 0.88 mg/dL 0.85 0.70 0.72  AST - (None) (None) (None)  Alk Phos - (None) (None) (None)  Bilirubin - (None) (None) (None)  Glucose 65 - 99 mg/dL 101(H) 95 99  Cholesterol - (None) (None) (None)  HDL cholesterol - (None) (  None) (None)  Triglycerides - (None) (None) (None)  LDL Direct - (None) (None) (None)  LDL Calc - (None) (None) (None)  Total protein - (None) (None) (None)  Albumin - (None) (None) (None)  Some recent data might be hidden   Lab Results  Component Value Date   TSH 3.870 07/26/2015   Lab Results  Component Value Date   BUN 21 06/15/2016   BUN 19 04/26/2016   BUN 20 04/25/2016   Lab Results  Component Value Date   HGBA1C 5.6 03/27/2016   HGBA1C 5.8 (H) 11/26/2015   HGBA1C 5.7 (H) 07/26/2015    Assessment/Plan  1. Essential hypertension controlled - Comprehensive metabolic panel; Future  2. Obese Encouraged weight loss  3. Edema, unspecified type improved  4. Persistent atrial fibrillation (HCC) Unchanged. Rate controlled and anticoagulated.  5. Cardiac pacemaker in situ Mild tenderness at insertion site  6. Hyperglycemia - Hemoglobin A1c; Future - Comprehensive metabolic panel; Future  7. HYPERCHOLESTEROLEMIA - Lipid panel; Future  8. Metatarsalgia left foot -Purchase metatarsal pads for pain relief 

## 2016-09-08 ENCOUNTER — Other Ambulatory Visit: Payer: Self-pay | Admitting: Internal Medicine

## 2016-09-08 DIAGNOSIS — I4819 Other persistent atrial fibrillation: Secondary | ICD-10-CM

## 2016-09-10 ENCOUNTER — Other Ambulatory Visit: Payer: Self-pay | Admitting: Internal Medicine

## 2016-09-10 DIAGNOSIS — I4819 Other persistent atrial fibrillation: Secondary | ICD-10-CM

## 2016-09-20 ENCOUNTER — Ambulatory Visit (INDEPENDENT_AMBULATORY_CARE_PROVIDER_SITE_OTHER): Payer: Medicare Other | Admitting: Internal Medicine

## 2016-09-20 ENCOUNTER — Encounter: Payer: Self-pay | Admitting: Internal Medicine

## 2016-09-20 VITALS — BP 118/82 | HR 86 | Ht 63.0 in | Wt 186.8 lb

## 2016-09-20 DIAGNOSIS — I10 Essential (primary) hypertension: Secondary | ICD-10-CM

## 2016-09-20 DIAGNOSIS — I484 Atypical atrial flutter: Secondary | ICD-10-CM | POA: Diagnosis not present

## 2016-09-20 LAB — CUP PACEART INCLINIC DEVICE CHECK
Battery Remaining Longevity: 129.6
Battery Voltage: 3.02 V
Implantable Lead Implant Date: 20170628
Implantable Lead Location: 753859
Lead Channel Pacing Threshold Amplitude: 0.75 V
Lead Channel Pacing Threshold Pulse Width: 0.4 ms
Lead Channel Sensing Intrinsic Amplitude: 2.5 mV
Lead Channel Setting Pacing Amplitude: 3.5 V
Lead Channel Setting Pacing Pulse Width: 0.4 ms
Lead Channel Setting Sensing Sensitivity: 2 mV
MDC IDC LEAD IMPLANT DT: 20170628
MDC IDC LEAD LOCATION: 753860
MDC IDC MSMT LEADCHNL RA IMPEDANCE VALUE: 537.5 Ohm
MDC IDC MSMT LEADCHNL RA SENSING INTR AMPL: 4 mV
MDC IDC MSMT LEADCHNL RV IMPEDANCE VALUE: 575 Ohm
MDC IDC PG SERIAL: 7910313
MDC IDC SESS DTM: 20170927152642
MDC IDC SET LEADCHNL RV PACING AMPLITUDE: 2.5 V
MDC IDC STAT BRADY RA PERCENT PACED: 0 %
MDC IDC STAT BRADY RV PERCENT PACED: 14 %

## 2016-09-20 MED ORDER — PROPAFENONE HCL ER 225 MG PO CP12: 225.0000 mg | ORAL_CAPSULE | Freq: Two times a day (BID) | ORAL | 11 refills | Status: DC

## 2016-09-20 NOTE — Progress Notes (Signed)
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 The patient underwent PPM insertion about 3 months ago. She is better. She still feels palpitations. No syncope. She has been anti-coagulated. No edema. No chest pain.  Allergies  Allergen Reactions  . Advil [Ibuprofen]     GI upset  . Aleve [Naproxen Sodium]     GI upset  . Aspirin Nausea And Vomiting    Takes baby aspirin qod without problems  . Atorvastatin     Leg cramps  . Celebrex [Celecoxib]     GI upset  . Diclofenac     GI upset  . Doxycycline     Headache  . Metoclopramide Hcl     REACTION: heart palps  . Nsaids Other (See Comments)    Tears my stomach up   . Other     Antihistamine - increase BP  . Pravastatin     Leg cramps  . Timolol     Dropped HR     Current Outpatient Prescriptions  Medication Sig Dispense Refill  . acetaminophen (TYLENOL) 500 MG tablet Take 500 mg by mouth every 6 (six) hours as needed for mild pain.     Marland Kitchen ALPRAZolam (XANAX) 0.25 MG tablet Take one tablet by mouth at bedtime as needed for rest or anxiety 30 tablet 1  . antiseptic oral rinse (BIOTENE) LIQD 15 mLs by Mouth Rinse route daily as needed for dry mouth.    . bimatoprost (LUMIGAN) 0.01 % SOLN Instill 1 drop into both eyes once daily in the evening    . BIOTIN 5000 PO Take 1 capsule by mouth daily.    . Cholecalciferol (VITAMIN D) 2000 UNITS tablet Take 2,000 Units by mouth daily.    Marland Kitchen diltiazem (CARDIZEM) 30 MG tablet Take 1 tablet by mouth every 6 hours as needed for fast heart rates 30 tablet 0  . dorzolamide (TRUSOPT) 2 % ophthalmic solution Place 1 drop into both eyes 3 (three) times daily.     Marland Kitchen ELIQUIS 5 MG TABS tablet TAKE 1 TABLET TWICE A DAY FOR ANTICOAGULATION 60 tablet 5  . furosemide (LASIX) 20 MG tablet Take 20 mg by mouth as needed for edema.     Marland Kitchen KETOPROFEN EX Apply 1 application topically daily as needed (pain). 20% cream; PRN for pain.    . methylcellulose (ARTIFICIAL  TEARS) 1 % ophthalmic solution Place 1 drop into both eyes daily as needed (for dry eyes).     . metoprolol tartrate (LOPRESSOR) 50 MG tablet Take 1 tablet (50 mg total) by mouth 2 (two) times daily. 60 tablet 6  . pantoprazole (PROTONIX) 40 MG tablet TAKE 1 TABLET BY MOUTH NIGHTLY TO REDUCE STOMACH ACID 30 tablet 5  . traZODone (DESYREL) 50 MG tablet Take 25 mg by mouth at bedtime.    . valsartan-hydrochlorothiazide (DIOVAN-HCT) 320-25 MG tablet TAKE 1 TABLET BY MOUTH EVERY DAY 30 tablet 4  . propafenone (RYTHMOL SR) 225 MG 12 hr capsule Take 1 capsule (225 mg total) by mouth 2 (two) times daily. 60 capsule 11   No current facility-administered medications for this visit.      Past Medical History:  Diagnosis Date  . Allergic rhinitis due to pollen   . Anxiety state, unspecified   . Arthritis    "right knee" (06/21/2016)  . Asymptomatic varicose veins   . Carpal tunnel syndrome   . Chest pain, unspecified   . Chronic lower back pain    "  on the left side" (06/21/2016)  . Cramp of limb   . Depressive disorder, not elsewhere classified   . Diaphragmatic hernia without mention of obstruction or gangrene   . Diaphragmatic hernia without mention of obstruction or gangrene   . Disturbance of skin sensation   . Diverticulosis of colon (without mention of hemorrhage)   . Enthesopathy of hip region   . Esophageal reflux   . Hepatitis, unspecified   . History of blood transfusion    "w/both knee replacements"  . History of duodenal ulcer   . Insomnia, unspecified   . Lumbago   . Migraine    "none since ~ 1990" (06/21/2016)  . Myalgia and myositis, unspecified   . Obesity, unspecified   . Other abnormal blood chemistry   . Other dysphagia   . Other malaise and fatigue   . Other nonspecific abnormal serum enzyme levels   . Other specified cardiac dysrhythmias(427.89)   . Pain in joint, ankle and foot   . Pain in joint, hand   . Pain in joint, lower leg   . Pain in joint, pelvic  region and thigh   . Pain in limb   . Plantar fascial fibromatosis   . Presence of permanent cardiac pacemaker   . Reflux esophagitis   . Sciatica   . Spinal stenosis, unspecified region other than cervical   . Stricture and stenosis of esophagus   . Symptomatic menopausal or female climacteric states   . Unspecified essential hypertension   . Unspecified essential hypertension   . Unspecified vitamin D deficiency     ROS:   All systems reviewed and negative except as noted in the HPI.   Past Surgical History:  Procedure Laterality Date  . BREAST BIOPSY Left 1990s X 2  . CARDIOVERSION N/A 04/26/2016   Procedure: CARDIOVERSION;  Surgeon: Pixie Casino, MD;  Location: [facility];  Service: Cardiovascular;  Laterality: N/A;  . CATARACT EXTRACTION W/ INTRAOCULAR LENS IMPLANT Left 08/03/1999   DR EPES   . CATARACT EXTRACTION W/ INTRAOCULAR LENS IMPLANT Right 2000   DR EPES  . [place]  . COLONOSCOPY  1988   Normal   . EP IMPLANTABLE DEVICE N/A 06/21/2016   Procedure: Pacemaker Implant;  Surgeon: Evans Lance, MD;  Location: [facility];  Service: Cardiovascular;  Laterality: N/A;  . ESOPHAGOGASTRODUODENOSCOPY (EGD) WITH ESOPHAGEAL DILATION  ~ 1982   Dr. Sharlett Iles  . EXCISION OF ACTINIC KERATOSIS     DR LUPTON   . EYE SURGERY    . INSERT / REPLACE / REMOVE PACEMAKER    . JOINT REPLACEMENT    . KNEE ARTHROSCOPY Left 2003  . KNEE ARTHROSCOPY Right 06-26-13  . KNEE CLOSED REDUCTION Right 10/23/2013   Procedure: CLOSED MANIPULATION RIGHT KNEE;  Surgeon: Mcarthur Rossetti, MD;  Location: [facility];  Service: Orthopedics;  Laterality: Right;  . LASER FOR CLOUDY CAP LEFT EYE Left 03/2006   DR DIGBY  . TOTAL KNEE ARTHROPLASTY Left 04/2004   DR RENDALL  . TOTAL KNEE ARTHROPLASTY Right 07/04/2013   Procedure: RIGHT TOTAL KNEE ARTHROPLASTY;  Surgeon: Mcarthur Rossetti, MD;  Location: [facility];  Service: Orthopedics;  Laterality: Right;  Marland Kitchen  VAGINAL HYSTERECTOMY  1979     Family History  Problem Relation Age of Onset  . Ovarian cancer Mother   . Uterine cancer Mother   . Cerebrovascular Accident Mother   . Heart disease Father   . Hypertension  Brother   . Obesity Daughter      Social History   Social History  . Marital status: Married    Spouse name: N/A  . Number of children: N/A  . Years of education: N/A   Occupational History  . Not on file.   Social History Main Topics  . Smoking status: Never Smoker  . Smokeless tobacco: Never Used  . Alcohol use No  . Drug use: No  . Sexual activity: Yes   Other Topics Concern  . Not on file   Social History Narrative  . No narrative on file     BP 118/82   Pulse 86   Ht 5\' 3"  (1.6 m)   Wt 186 lb 12.8 oz (84.7 kg)   SpO2 97%   BMI 33.09 kg/m   Physical Exam:  Well appearing NAD HEENT: Unremarkable Neck:  No JVD, no thyromegally Lymphatics:  No adenopathy Back:  No CVA tenderness Lungs:  Clear HEART:  Regular rate rhythm, no murmurs, no rubs, no clicks Abd:  soft, positive bowel sounds, no organomegally, no rebound, no guarding Ext:  2 plus pulses, no edema, no cyanosis, no clubbing Skin:  No rashes no nodules Neuro:  CN II through XII intact, motor grossly intact  EKG - atrial fib with a controlled VR  DEVICE  Normal device function.  See PaceArt for details.   Assess/Plan: 1. Atrial fib/flutter - she remains out of rhythm. She is better rate controlled. However, she still feels palpitations. We discussed additional treatment options with the patient and I have recommended we start low dose rhythmol. Will have her return in 3 weeks for an ECG and try and pace her back to NSR. If unsuccessful, then DCCV would be warranted. 2. PPM - her [employer] device is working normally. Will follow. 3. Sinus node dysfunction - she is minimally symptomatic since her PPM was placed.  Mikle Bosworth.D.

## 2016-09-20 NOTE — Patient Instructions (Addendum)
Medication Instructions:  START Rythmol 225 mg twice daily   Labwork: None Ordered   Testing/Procedures: None Ordered   Follow-Up: Remote monitoring is used to monitor your Pacemaker from home. This monitoring reduces the number of office visits required to check your device to one time per year. It allows [place] to keep an eye on the functioning of your device to ensure it is working properly. You are scheduled for a device check from home on 12/21/16. You may send your transmission at any time that day. If you have a wireless device, the transmission will be sent automatically. After your physician reviews your transmission, you will receive a postcard with your next transmission date.  Your physician recommends that you schedule a follow-up appointment in: 3 weeks with Dr. Lovena Le.      If you need a refill on your cardiac medications before your next appointment, please call your pharmacy.   Thank you for choosing [facility]! Christen Bame, RN [phone]

## 2016-09-30 NOTE — Progress Notes (Signed)
Cardiology Office Note   Date:  10/02/2016   ID:  [name], DOB 1931/04/10, MRN [id number]  PCP:  Veronica Hubert, MD  Cardiologist:   Veronica Martinique, MD   No chief complaint on file.     History of Present Illness: [name] is a 82 y.o. female is seen for follow up Afibflutter. She has a long history of marked sinus bradycardia. She was seen by Dr. Aundra Dubin in 2014. Echo at that time showed mild LAE otherwise normal. Holter showed mean HR 59 with lowest HR 43 and peak HR 109. She reports her HR is typically 48 but she was asymptomatic. On March 9,2017 she noted an increased HR by BP monitor up to 153. At this time she felt marked indigestion from her waist to her neck. She felt her heart fluttering.   She was seen by Dr. Nyoka Cowden on 03/29/16 and found to be in Afib with RVR. She was started on Eliquis and metoprolol. Myoview study and Echo were normal.  She later had attempt at DCCV. She was in an atypical atrial flutter at that time. DCCV resulted in very prolonged pauses > 6 seconds and return to flutter. She was seen by Dr. Curt Bears and placed on flecainide. This did convert her to NSR but made her feel very sick with nausea, dizziness, and extreme fatigue. Flecainide was discontinued.. She continued to have marked bradycardia and underwent PPM placement on 06/21/16. When seen in September by Dr. Lovena Le she had an Afib burden of 94%. She was started on propafenone for her Afib. Initially this medication caused her to have more nausea but this has settled down.  She does still complain of symptoms of fatigue and dyspnea on exertion. It has become tough for her to work out at [employer]. 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 She falls asleep more easily. Her HR has been under control.    Past Medical History:  Diagnosis Date  . Allergic rhinitis due to pollen   . Anxiety state, unspecified   . Arthritis    "right knee" (06/21/2016)  . Asymptomatic varicose  veins   . Carpal tunnel syndrome   . Chest pain, unspecified   . Chronic lower back pain    "on the left side" (06/21/2016)  . Cramp of limb   . Depressive disorder, not elsewhere classified   . Diaphragmatic hernia without mention of obstruction or gangrene   . Diaphragmatic hernia without mention of obstruction or gangrene   . Disturbance of skin sensation   . Diverticulosis of colon (without mention of hemorrhage)   . Enthesopathy of hip region   . Esophageal reflux   . Hepatitis, unspecified   . History of blood transfusion    "w/both knee replacements"  . History of duodenal ulcer   . Insomnia, unspecified   . Lumbago   . Migraine    "none since ~ 1990" (06/21/2016)  . Myalgia and myositis, unspecified   . Obesity, unspecified   . Other abnormal blood chemistry   . Other dysphagia   . Other malaise and fatigue   . Other nonspecific abnormal serum enzyme levels   . Other specified cardiac dysrhythmias(427.89)   . Pain in joint, ankle and foot   . Pain in joint, hand   . Pain in joint, lower leg   . Pain in joint, pelvic region and thigh   . Pain in limb   . Plantar fascial fibromatosis   .  Presence of permanent cardiac pacemaker   . Reflux esophagitis   . Sciatica   . Spinal stenosis, unspecified region other than cervical   . Stricture and stenosis of esophagus   . Symptomatic menopausal or female climacteric states   . Unspecified essential hypertension   . Unspecified essential hypertension   . Unspecified vitamin D deficiency     Past Surgical History:  Procedure Laterality Date  . BREAST BIOPSY Left 1990s X 2  . CARDIOVERSION N/A 04/26/2016   Procedure: CARDIOVERSION;  Surgeon: Pixie Casino, MD;  Location: [facility];  Service: Cardiovascular;  Laterality: N/A;  . CATARACT EXTRACTION W/ INTRAOCULAR LENS IMPLANT Left 08/03/1999   DR EPES   . CATARACT EXTRACTION W/ INTRAOCULAR LENS IMPLANT Right 2000   DR EPES  . [place]    . COLONOSCOPY  1988   Normal   . EP IMPLANTABLE DEVICE N/A 06/21/2016   Procedure: Pacemaker Implant;  Surgeon: Evans Lance, MD;  Location: [facility];  Service: Cardiovascular;  Laterality: N/A;  . ESOPHAGOGASTRODUODENOSCOPY (EGD) WITH ESOPHAGEAL DILATION  ~ 1982   Dr. Sharlett Iles  . EXCISION OF ACTINIC KERATOSIS     DR LUPTON   . EYE SURGERY    . INSERT / REPLACE / REMOVE PACEMAKER    . JOINT REPLACEMENT    . KNEE ARTHROSCOPY Left 2003  . KNEE ARTHROSCOPY Right 06-26-13  . KNEE CLOSED REDUCTION Right 10/23/2013   Procedure: CLOSED MANIPULATION RIGHT KNEE;  Surgeon: Mcarthur Rossetti, MD;  Location: [facility];  Service: Orthopedics;  Laterality: Right;  . LASER FOR CLOUDY CAP LEFT EYE Left 03/2006   DR DIGBY  . TOTAL KNEE ARTHROPLASTY Left 04/2004   DR RENDALL  . TOTAL KNEE ARTHROPLASTY Right 07/04/2013   Procedure: RIGHT TOTAL KNEE ARTHROPLASTY;  Surgeon: Mcarthur Rossetti, MD;  Location: [facility];  Service: Orthopedics;  Laterality: Right;  Marland Kitchen VAGINAL HYSTERECTOMY  1979     Current Outpatient Prescriptions  Medication Sig Dispense Refill  . acetaminophen (TYLENOL) 500 MG tablet Take 500 mg by mouth every 6 (six) hours as needed for mild pain.     Marland Kitchen ALPRAZolam (XANAX) 0.25 MG tablet Take one tablet by mouth at bedtime as needed for rest or anxiety 30 tablet 1  . antiseptic oral rinse (BIOTENE) LIQD 15 mLs by Mouth Rinse route daily as needed for dry mouth.    . bimatoprost (LUMIGAN) 0.01 % SOLN Instill 1 drop into both eyes once daily in the evening    . BIOTIN 5000 PO Take 1 capsule by mouth daily.    . Cholecalciferol (VITAMIN D) 2000 UNITS tablet Take 2,000 Units by mouth daily.    Marland Kitchen diltiazem (CARDIZEM) 30 MG tablet Take 1 tablet by mouth every 6 hours as needed for fast heart rates (Patient taking differently: Take 1 tablet by mouth every 6 hours as needed for fast heart rates. Not currently taking at the moment until next F/U w/Dr. Lovena Le next week) 30 tablet 0  .  dorzolamide (TRUSOPT) 2 % ophthalmic solution Place 1 drop into both eyes 3 (three) times daily.     Marland Kitchen ELIQUIS 5 MG TABS tablet TAKE 1 TABLET TWICE A DAY FOR ANTICOAGULATION 60 tablet 5  . furosemide (LASIX) 20 MG tablet Take 20 mg by mouth as needed for edema.     Marland Kitchen KETOPROFEN EX Apply 1 application topically daily as needed (pain). 20% cream; PRN for pain.    . methylcellulose (  ARTIFICIAL TEARS) 1 % ophthalmic solution Place 1 drop into both eyes daily as needed (for dry eyes).     . metoprolol tartrate (LOPRESSOR) 50 MG tablet Take 1 tablet (50 mg total) by mouth 2 (two) times daily. 60 tablet 6  . pantoprazole (PROTONIX) 40 MG tablet TAKE 1 TABLET BY MOUTH NIGHTLY TO REDUCE STOMACH ACID 30 tablet 5  . propafenone (RYTHMOL SR) 225 MG 12 hr capsule Take 1 capsule (225 mg total) by mouth 2 (two) times daily. 60 capsule 11  . traZODone (DESYREL) 50 MG tablet Take 25 mg by mouth at bedtime.    . valsartan-hydrochlorothiazide (DIOVAN-HCT) 320-25 MG tablet TAKE 1 TABLET BY MOUTH EVERY DAY 30 tablet 4   No current facility-administered medications for this visit.     Allergies:   Advil [ibuprofen]; Aleve [naproxen sodium]; Aspirin; Atorvastatin; Celebrex [celecoxib]; Diclofenac; Doxycycline; Metoclopramide hcl; Nsaids; Other; Pravastatin; and Timolol    Social History:  The patient  reports that she has never smoked. She has never used smokeless tobacco. She reports that she does not drink alcohol or use drugs.   Family History:  The patient's family history includes Cerebrovascular Accident in her mother; Heart disease in her father; Hypertension in her brother; Obesity in her daughter; Ovarian cancer in her mother; Uterine cancer in her mother.    ROS:  Please see the history of present illness.   Otherwise, review of systems are positive for none.   All other systems are reviewed and negative.    PHYSICAL EXAM: VS:  BP (!) 152/97   Pulse 87   Ht 5\' 2"  (1.575 m)   Wt 187 lb 12.8 oz (85.2  kg)   SpO2 97%   BMI 34.35 kg/m  , BMI Body mass index is 34.35 kg/m. GEN: Well nourished, well developed, in no acute distress  HEENT: normal  Neck: no JVD, carotid bruits, or masses Cardiac:IRRR; no murmurs, rubs, or gallops,no edema  Respiratory:  clear to auscultation bilaterally, normal work of breathing GI: soft, nontender, nondistended, + BS MS: no deformity or atrophy  Skin: warm and dry, no rash Neuro:  Strength and sensation are intact Psych: euthymic mood, full affect   EKG:  EKG is not ordered today.      Recent Labs: 03/27/2016: ALT 16 06/15/2016: BUN 21; Creat 0.85; Hemoglobin 13.7; Platelets 201; Potassium 4.0; Sodium 138    Lipid Panel    Component Value Date/Time   CHOL 179 03/27/2016 0853   TRIG 190 (H) 03/27/2016 0853   HDL 42 03/27/2016 0853   CHOLHDL 4.3 03/27/2016 0853   LDLCALC 99 03/27/2016 0853      Wt Readings from Last 3 Encounters:  10/02/16 187 lb 12.8 oz (85.2 kg)  09/20/16 186 lb 12.8 oz (84.7 kg)  08/30/16 185 lb (83.9 kg)      Other studies Reviewed: Additional studies/ records that were reviewed today include: recent Echo and Myoview Review of the above records demonstrates:   Study Highlights     The left ventricular ejection fraction is mildly decreased (45-54%).  Nuclear stress EF: 51%.  There was no ST segment deviation noted during stress.  The study is normal.  This is a low risk study.  Low risk stress nuclear study with normal perfusion and borderline left ventricular global systolic function.  Consider alternative imaging for confirmation of LVEF, which may be underestimated on current study,    Study Conclusions  - Left ventricle: The cavity size was normal. Wall thickness was  normal. Systolic function was normal. The estimated ejection  fraction was in the range of 55% to 60%.   ASSESSMENT AND PLAN:  1.  Atrial fibrillation/flutter with RVR. Unsuccessful DCCV with marked conversion pauses. She  did convert on flecainide but unable to tolerate this medication. Now s/p PPM placement. Recently started on propafenone for Afib. By exam she appears to be in Afib. She is clearly symptomatic and I think we should try and restore and maintain NSR. She is scheduled for follow up with Dr. Lovena Le. We discussed possibilities of repeat DCCV on AAD, a different AAD, or ablation.   2. HTN well controlled.  3. Mild hypercholesterolemia.    Current medicines are reviewed at length with the patient today.  The patient does not have concerns regarding medicines.  The following changes have been made:    Labs/ tests ordered today include:   No orders of the defined types were placed in this encounter.    Disposition: follow up with me in 3 months.   Signed, Veronica Martinique, MD  10/02/2016 10:22 AM    [facility] [address], [place], [place], [address] Phone [phone], Fax [phone]

## 2016-10-02 ENCOUNTER — Encounter: Payer: Self-pay | Admitting: Cardiology

## 2016-10-02 ENCOUNTER — Ambulatory Visit (INDEPENDENT_AMBULATORY_CARE_PROVIDER_SITE_OTHER): Payer: Medicare Other | Admitting: Cardiology

## 2016-10-02 VITALS — BP 152/97 | HR 87 | Ht 62.0 in | Wt 187.8 lb

## 2016-10-02 DIAGNOSIS — I10 Essential (primary) hypertension: Secondary | ICD-10-CM

## 2016-10-02 DIAGNOSIS — I481 Persistent atrial fibrillation: Secondary | ICD-10-CM

## 2016-10-02 DIAGNOSIS — E7800 Pure hypercholesterolemia, unspecified: Secondary | ICD-10-CM | POA: Diagnosis not present

## 2016-10-02 DIAGNOSIS — I4819 Other persistent atrial fibrillation: Secondary | ICD-10-CM

## 2016-10-02 NOTE — Patient Instructions (Signed)
Continue your current therapy and follow up with Dr. Lovena Le.  We need to focus on getting you back in normal rhythm

## 2016-10-10 ENCOUNTER — Encounter: Payer: Self-pay | Admitting: Internal Medicine

## 2016-10-10 ENCOUNTER — Ambulatory Visit (INDEPENDENT_AMBULATORY_CARE_PROVIDER_SITE_OTHER): Payer: Medicare Other | Admitting: Internal Medicine

## 2016-10-10 VITALS — BP 128/84 | HR 81 | Ht 62.0 in | Wt 189.4 lb

## 2016-10-10 DIAGNOSIS — I481 Persistent atrial fibrillation: Secondary | ICD-10-CM | POA: Diagnosis not present

## 2016-10-10 DIAGNOSIS — I4819 Other persistent atrial fibrillation: Secondary | ICD-10-CM

## 2016-10-10 DIAGNOSIS — I499 Cardiac arrhythmia, unspecified: Secondary | ICD-10-CM

## 2016-10-10 NOTE — Patient Instructions (Addendum)
Medication Instructions:  Your physician recommends that you continue on your current medications as directed. Please refer to the Current Medication list given to you today.   Labwork: None Ordered   Testing/Procedures: Your physician has requested that you have an exercise tolerance test. - 1 week    Follow-Up: Follow-up to be determined following stress test results.   Any Other Special Instructions Will Be Listed Below (If Applicable).     If you need a refill on your cardiac medications before your next appointment, please call your pharmacy.

## 2016-10-10 NOTE — Progress Notes (Signed)
HPI Mrs. 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 The patient underwent PPM insertion about 3 months ago. She is better. When I saw her last a few weeks ago, she had continued to have palpitations. She was in atrial fib. We placed her on Rhythmol and she has reverted back to NSR. She feels much better. She admits to some dietary indiscretion.  Allergies  Allergen Reactions  . Advil [Ibuprofen]     GI upset  . Aleve [Naproxen Sodium]     GI upset  . Aspirin Nausea And Vomiting    Takes baby aspirin qod without problems  . Atorvastatin     Leg cramps  . Celebrex [Celecoxib]     GI upset  . Diclofenac     GI upset  . Doxycycline     Headache  . Metoclopramide Hcl     REACTION: heart palps  . Nsaids Other (See Comments)    Tears my stomach up   . Other     Antihistamine - increase BP  . Pravastatin     Leg cramps  . Timolol     Dropped HR     Current Outpatient Prescriptions  Medication Sig Dispense Refill  . acetaminophen (TYLENOL) 500 MG tablet Take 500 mg by mouth every 6 (six) hours as needed for mild pain.     Marland Kitchen ALPRAZolam (XANAX) 0.25 MG tablet Take one tablet by mouth at bedtime as needed for rest or anxiety 30 tablet 1  . antiseptic oral rinse (BIOTENE) LIQD 15 mLs by Mouth Rinse route daily as needed for dry mouth.    . bimatoprost (LUMIGAN) 0.01 % SOLN Instill 1 drop into both eyes once daily in the evening    . BIOTIN 5000 PO Take 1 capsule by mouth daily.    . Cholecalciferol (VITAMIN D) 2000 UNITS tablet Take 2,000 Units by mouth daily.    Marland Kitchen diltiazem (CARDIZEM) 30 MG tablet Take 1 tablet by mouth every 6 hours as needed for fast heart rates (Patient taking differently: Take 1 tablet by mouth every 6 hours as needed for fast heart rates. Not currently taking at the moment until next F/U w/Dr. Lovena Le next week) 30 tablet 0  . dorzolamide (TRUSOPT) 2 % ophthalmic solution Place 1 drop into both eyes 3 (three) times daily.      Marland Kitchen ELIQUIS 5 MG TABS tablet TAKE 1 TABLET TWICE A DAY FOR ANTICOAGULATION 60 tablet 5  . furosemide (LASIX) 20 MG tablet Take 20 mg by mouth as needed for edema.     Marland Kitchen KETOPROFEN EX Apply 1 application topically daily as needed (pain). 20% cream; PRN for pain.    . methylcellulose (ARTIFICIAL TEARS) 1 % ophthalmic solution Place 1 drop into both eyes daily as needed (for dry eyes).     . metoprolol tartrate (LOPRESSOR) 50 MG tablet Take 1 tablet (50 mg total) by mouth 2 (two) times daily. 60 tablet 6  . pantoprazole (PROTONIX) 40 MG tablet TAKE 1 TABLET BY MOUTH NIGHTLY TO REDUCE STOMACH ACID 30 tablet 5  . propafenone (RYTHMOL SR) 225 MG 12 hr capsule Take 1 capsule (225 mg total) by mouth 2 (two) times daily. 60 capsule 11  . traZODone (DESYREL) 50 MG tablet Take 25 mg by mouth at bedtime.    . valsartan-hydrochlorothiazide (DIOVAN-HCT) 320-25 MG tablet TAKE 1 TABLET BY MOUTH EVERY DAY 30 tablet 4   No current facility-administered medications for this visit.  Past Medical History:  Diagnosis Date  . Allergic rhinitis due to pollen   . Anxiety state, unspecified   . Arthritis    "right knee" (06/21/2016)  . Asymptomatic varicose veins   . Carpal tunnel syndrome   . Chest pain, unspecified   . Chronic lower back pain    "on the left side" (06/21/2016)  . Cramp of limb   . Depressive disorder, not elsewhere classified   . Diaphragmatic hernia without mention of obstruction or gangrene   . Diaphragmatic hernia without mention of obstruction or gangrene   . Disturbance of skin sensation   . Diverticulosis of colon (without mention of hemorrhage)   . Enthesopathy of hip region   . Esophageal reflux   . Hepatitis, unspecified   . History of blood transfusion    "w/both knee replacements"  . History of duodenal ulcer   . Insomnia, unspecified   . Lumbago   . Migraine    "none since ~ 1990" (06/21/2016)  . Myalgia and myositis, unspecified   . Obesity, unspecified   . Other  abnormal blood chemistry   . Other dysphagia   . Other malaise and fatigue   . Other nonspecific abnormal serum enzyme levels   . Other specified cardiac dysrhythmias(427.89)   . Pain in joint, ankle and foot   . Pain in joint, hand   . Pain in joint, lower leg   . Pain in joint, pelvic region and thigh   . Pain in limb   . Plantar fascial fibromatosis   . Presence of permanent cardiac pacemaker   . Reflux esophagitis   . Sciatica   . Spinal stenosis, unspecified region other than cervical   . Stricture and stenosis of esophagus   . Symptomatic menopausal or female climacteric states   . Unspecified essential hypertension   . Unspecified essential hypertension   . Unspecified vitamin D deficiency     ROS:   All systems reviewed and negative except as noted in the HPI.   Past Surgical History:  Procedure Laterality Date  . BREAST BIOPSY Left 1990s X 2  . CARDIOVERSION N/A 04/26/2016   Procedure: CARDIOVERSION;  Surgeon: Pixie Casino, MD;  Location: [facility];  Service: Cardiovascular;  Laterality: N/A;  . CATARACT EXTRACTION W/ INTRAOCULAR LENS IMPLANT Left 08/03/1999   DR EPES   . CATARACT EXTRACTION W/ INTRAOCULAR LENS IMPLANT Right 2000   DR EPES  . [place]  . COLONOSCOPY  1988   Normal   . EP IMPLANTABLE DEVICE N/A 06/21/2016   Procedure: Pacemaker Implant;  Surgeon: Evans Lance, MD;  Location: [facility];  Service: Cardiovascular;  Laterality: N/A;  . ESOPHAGOGASTRODUODENOSCOPY (EGD) WITH ESOPHAGEAL DILATION  ~ 1982   Dr. Sharlett Iles  . EXCISION OF ACTINIC KERATOSIS     DR LUPTON   . EYE SURGERY    . INSERT / REPLACE / REMOVE PACEMAKER    . JOINT REPLACEMENT    . KNEE ARTHROSCOPY Left 2003  . KNEE ARTHROSCOPY Right 06-26-13  . KNEE CLOSED REDUCTION Right 10/23/2013   Procedure: CLOSED MANIPULATION RIGHT KNEE;  Surgeon: Mcarthur Rossetti, MD;  Location: [facility];  Service: Orthopedics;  Laterality: Right;  . LASER FOR  CLOUDY CAP LEFT EYE Left 03/2006   DR DIGBY  . TOTAL KNEE ARTHROPLASTY Left 04/2004   DR RENDALL  . TOTAL KNEE ARTHROPLASTY Right 07/04/2013   Procedure: RIGHT TOTAL KNEE ARTHROPLASTY;  Surgeon: Mcarthur Rossetti,  MD;  [facility];  Service: Orthopedics;  Laterality: Right;  Marland Kitchen VAGINAL HYSTERECTOMY  1979     Family History  Problem Relation Age of Onset  . Ovarian cancer Mother   . Uterine cancer Mother   . Cerebrovascular Accident Mother   . Heart disease Father   . Hypertension Brother   . Obesity Daughter      Social History   Social History  . Marital status: Married    Spouse name: N/A  . Number of children: N/A  . Years of education: N/A   Occupational History  . Not on file.   Social History Main Topics  . Smoking status: Never Smoker  . Smokeless tobacco: Never Used  . Alcohol use No  . Drug use: No  . Sexual activity: Yes   Other Topics Concern  . Not on file   Social History Narrative  . No narrative on file     BP 128/84   Pulse 81   Ht 5\' 2"  (1.575 m)   Wt 189 lb 6.4 oz (85.9 kg)   BMI 34.64 kg/m   Physical Exam:  Well appearing 82 yo woman, NAD HEENT: Unremarkable Neck:  No JVD, no thyromegally Lymphatics:  No adenopathy Back:  No CVA tenderness Lungs:  Clear with no wheezes HEART:  Regular rate rhythm, no murmurs, no rubs, no clicks Abd:  soft, positive bowel sounds, no organomegally, no rebound, no guarding Ext:  2 plus pulses, no edema, no cyanosis, no clubbing Skin:  No rashes no nodules Neuro:  CN II through XII intact, motor grossly intact  EKG - nsr with atrial pacing  DEVICE  Normal device function.  See PaceArt for details.   Assess/Plan: 1. Atrial fib/flutter - she has reverted back to NSR. She will continue her propafenone. She will undergo exercise testing. 2. PPM - her [employer] device is working normally. Will follow. 3. Sinus node dysfunction - she is minimally symptomatic since her PPM was placed.  Mikle [email or web address].D.

## 2016-10-11 ENCOUNTER — Ambulatory Visit (INDEPENDENT_AMBULATORY_CARE_PROVIDER_SITE_OTHER): Payer: Medicare Other | Admitting: Podiatry

## 2016-10-11 ENCOUNTER — Ambulatory Visit (INDEPENDENT_AMBULATORY_CARE_PROVIDER_SITE_OTHER): Payer: Medicare Other

## 2016-10-11 ENCOUNTER — Encounter: Payer: Self-pay | Admitting: Podiatry

## 2016-10-11 VITALS — BP 133/82 | HR 77 | Resp 16 | Ht 63.0 in | Wt 186.0 lb

## 2016-10-11 DIAGNOSIS — M79671 Pain in right foot: Secondary | ICD-10-CM

## 2016-10-11 DIAGNOSIS — M79672 Pain in left foot: Secondary | ICD-10-CM

## 2016-10-11 DIAGNOSIS — M779 Enthesopathy, unspecified: Secondary | ICD-10-CM

## 2016-10-11 NOTE — Progress Notes (Signed)
   Subjective:    Patient ID: [name], female    DOB: 1931-06-30, 82 y.o.   MRN: [id number]  HPI Chief Complaint  Patient presents with  . Foot Pain    Bilateral; plantar forefoot; pt stated, "Thinks hurt feet when exercised 6 weeks ago; was on tippy toes on a ball for balance; left foot hurts more than right"      Review of Systems  Cardiovascular: Positive for chest pain and leg swelling.  Musculoskeletal: Positive for back pain and myalgias.  Neurological: Positive for dizziness.  All other systems reviewed and are negative.      Objective:   Physical Exam        Assessment & Plan:

## 2016-10-11 NOTE — Progress Notes (Signed)
Subjective:     Patient ID: [name], female   DOB: Dec 07, 1931, 82 y.o.   MRN: [id number]  HPI patient states that she needs to be active and has developed forefoot pain bilateral with the left hurting more than the right with inflammation noted upon palpation and states that she cannot use steroid   Review of Systems  All other systems reviewed and are negative.      Objective:   Physical Exam  Constitutional: She is oriented to person, place, and time.  Cardiovascular: Intact distal pulses.   Musculoskeletal: Normal range of motion.  Neurological: She is oriented to person, place, and time.  Skin: Skin is warm.  Nursing note and vitals reviewed.  neurovascular status intact muscle strength adequate range of motion within normal limits with patient found to have inflammatory changes in the second third and fourth metatarsophalangeal joint bilateral with diminished fat pad and prominence of the metatarsals bilateral. Patient's found have good digital perfusion is well oriented 3     Assessment:     Inflammatory capsulitis of the second third and fourth MPJs bilateral    Plan:     H&P x-rays and conditions reviewed and recommended orthotics to try to reduce plantar pressure. At this time I scanned for custom orthotic devices and advised on supportive shoe gear usage and reappoint for [place] to recheck  X-ray report indicates that the patient has mild decalcification but no indications of stress fracture or other pathology

## 2016-10-27 ENCOUNTER — Ambulatory Visit (INDEPENDENT_AMBULATORY_CARE_PROVIDER_SITE_OTHER): Payer: Medicare Other

## 2016-10-27 DIAGNOSIS — I499 Cardiac arrhythmia, unspecified: Secondary | ICD-10-CM

## 2016-10-30 LAB — EXERCISE TOLERANCE TEST
CHL CUP MPHR: 137 {beats}/min
CHL RATE OF PERCEIVED EXERTION: 13
CSEPHR: 110 %
Estimated workload: 4 METS
Exercise duration (min): 1 min
Exercise duration (sec): 37 s
Peak HR: 151 {beats}/min
Rest HR: 78 {beats}/min

## 2016-11-01 ENCOUNTER — Ambulatory Visit (INDEPENDENT_AMBULATORY_CARE_PROVIDER_SITE_OTHER): Payer: Medicare Other

## 2016-11-01 DIAGNOSIS — M79672 Pain in left foot: Secondary | ICD-10-CM

## 2016-11-01 DIAGNOSIS — M79671 Pain in right foot: Secondary | ICD-10-CM

## 2016-11-01 DIAGNOSIS — M779 Enthesopathy, unspecified: Secondary | ICD-10-CM

## 2016-11-01 NOTE — Patient Instructions (Signed)

## 2016-11-01 NOTE — Progress Notes (Signed)
Patient presents for orthotic pick up.  Verbal and written break in and wear instructions given.  Patient will follow up in 4 weeks if symptoms worsen or fail to improve. 

## 2016-11-09 DIAGNOSIS — H04123 Dry eye syndrome of bilateral lacrimal glands: Secondary | ICD-10-CM | POA: Diagnosis not present

## 2016-11-09 DIAGNOSIS — H40013 Open angle with borderline findings, low risk, bilateral: Secondary | ICD-10-CM | POA: Diagnosis not present

## 2016-11-14 ENCOUNTER — Telehealth: Payer: Self-pay

## 2016-11-14 NOTE — Telephone Encounter (Signed)
Returned patient's phone cal regarding her concern about her orthotic hurting her left foot. She states that 10-15 minutes into wearing the left orthotic, her foot begins to swelling and go numb she has tried using the orthotics as directed but her foot continues to swell and tingle. She wants to return the inserts. I tried to advise her that we could make adjustments to the insert to try to accommodate and make them comfortable but she did not seem to believe that would solve the problem. She states she will come in with her husband for his appt next week and we could discuss the problem with her orthotic with the doctor.

## 2016-11-27 ENCOUNTER — Other Ambulatory Visit: Payer: Self-pay | Admitting: Internal Medicine

## 2016-12-12 ENCOUNTER — Ambulatory Visit (INDEPENDENT_AMBULATORY_CARE_PROVIDER_SITE_OTHER): Payer: Medicare Other | Admitting: Internal Medicine

## 2016-12-12 ENCOUNTER — Ambulatory Visit (INDEPENDENT_AMBULATORY_CARE_PROVIDER_SITE_OTHER): Payer: Medicare Other

## 2016-12-12 ENCOUNTER — Encounter: Payer: Self-pay | Admitting: Internal Medicine

## 2016-12-12 VITALS — BP 170/100 | HR 63 | Temp 97.9°F | Ht 63.0 in | Wt 184.6 lb

## 2016-12-12 VITALS — BP 156/98 | HR 63 | Temp 97.9°F | Ht 63.0 in | Wt 184.0 lb

## 2016-12-12 DIAGNOSIS — Z0000 Encounter for general adult medical examination without abnormal findings: Secondary | ICD-10-CM | POA: Diagnosis not present

## 2016-12-12 DIAGNOSIS — R739 Hyperglycemia, unspecified: Secondary | ICD-10-CM | POA: Diagnosis not present

## 2016-12-12 DIAGNOSIS — G4700 Insomnia, unspecified: Secondary | ICD-10-CM | POA: Diagnosis not present

## 2016-12-12 DIAGNOSIS — I10 Essential (primary) hypertension: Secondary | ICD-10-CM

## 2016-12-12 DIAGNOSIS — R609 Edema, unspecified: Secondary | ICD-10-CM

## 2016-12-12 DIAGNOSIS — E7800 Pure hypercholesterolemia, unspecified: Secondary | ICD-10-CM

## 2016-12-12 DIAGNOSIS — K2100 Gastro-esophageal reflux disease with esophagitis, without bleeding: Secondary | ICD-10-CM

## 2016-12-12 DIAGNOSIS — Z6832 Body mass index (BMI) 32.0-32.9, adult: Secondary | ICD-10-CM | POA: Diagnosis not present

## 2016-12-12 DIAGNOSIS — I480 Paroxysmal atrial fibrillation: Secondary | ICD-10-CM

## 2016-12-12 DIAGNOSIS — E6609 Other obesity due to excess calories: Secondary | ICD-10-CM

## 2016-12-12 DIAGNOSIS — Z23 Encounter for immunization: Secondary | ICD-10-CM

## 2016-12-12 MED ORDER — TRAZODONE HCL 50 MG PO TABS: ORAL_TABLET | ORAL | 5 refills | Status: DC

## 2016-12-12 NOTE — Progress Notes (Signed)
Facility  [place]    Place of Service:   OFFICE    Allergies  Allergen Reactions  . Advil [Ibuprofen]     GI upset  . Aleve [Naproxen Sodium]     GI upset  . Aspirin Nausea And Vomiting    Takes baby aspirin qod without problems  . Atorvastatin     Leg cramps  . Celebrex [Celecoxib]     GI upset  . Diclofenac     GI upset  . Doxycycline     Headache  . Metoclopramide Hcl     REACTION: heart palps  . Nsaids Other (See Comments)    Tears my stomach up   . Other     Antihistamine - increase BP  . Pravastatin     Leg cramps  . Timolol     Dropped HR    Chief Complaint  Patient presents with  . Medical Management of Chronic Issues    blood pressure, A-Fib, cholesterol, hyperglycemia    HPI:  Essential hypertension - slightly elevated  Paroxysmal atrial fibrillation (HCC) - no recent palpitations. Currently in NSR.  Edema, unspecified type - mild  Gastroesophageal reflux disease with esophagitis - some acid reflux  Class 1 obesity due to excess calories with serious comorbidity and body mass index (BMI) of 32.0 to 32.9 in adult - 5# loss in in last 2 mo. Dieting.  Hyperglycemia - follow lab.    Medications: Patient's Medications  New Prescriptions   No medications on file  Previous Medications   ACETAMINOPHEN (TYLENOL) 500 MG TABLET    Take 500 mg by mouth every 6 (six) hours as needed for mild pain.    ALPRAZOLAM (XANAX) 0.25 MG TABLET    Take one tablet by mouth at bedtime as needed for rest or anxiety   ANTISEPTIC ORAL RINSE (BIOTENE) LIQD    15 mLs by Mouth Rinse route daily as needed for dry mouth.   BIMATOPROST (LUMIGAN) 0.01 % SOLN    Instill 1 drop into both eyes once daily in the evening   BIOTIN 5000 PO    Take 1 capsule by mouth daily.   CHOLECALCIFEROL (VITAMIN D) 2000 UNITS TABLET    Take 2,000 Units by mouth daily.   DORZOLAMIDE (TRUSOPT) 2 % OPHTHALMIC SOLUTION    Place 1 drop into both eyes 3 (three) times daily.    ELIQUIS 5 MG TABS  TABLET    TAKE 1 TABLET TWICE A DAY FOR ANTICOAGULATION   FUROSEMIDE (LASIX) 20 MG TABLET    Take 20 mg by mouth as needed for edema.    KETOPROFEN EX    Apply 1 application topically daily as needed (pain). 20% cream; PRN for pain.   METHYLCELLULOSE (ARTIFICIAL TEARS) 1 % OPHTHALMIC SOLUTION    Place 1 drop into both eyes daily as needed (for dry eyes).    METOPROLOL TARTRATE (LOPRESSOR) 50 MG TABLET    Take 1 tablet (50 mg total) by mouth 2 (two) times daily.   PANTOPRAZOLE (PROTONIX) 40 MG TABLET    TAKE 1 TABLET BY MOUTH NIGHTLY TO REDUCE STOMACH ACID   PROPAFENONE (RYTHMOL SR) 225 MG 12 HR CAPSULE    Take 1 capsule (225 mg total) by mouth 2 (two) times daily.   TRAZODONE (DESYREL) 50 MG TABLET    Take 25 mg by mouth at bedtime.   VALSARTAN-HYDROCHLOROTHIAZIDE (DIOVAN-HCT) 320-25 MG TABLET    TAKE 1 TABLET BY MOUTH EVERY DAY  Modified Medications   No medications  on file  Discontinued Medications   No medications on file    Review of Systems  Constitutional: Positive for activity change. Negative for appetite change, chills, diaphoresis, fatigue, fever and unexpected weight change.  HENT: Negative.   Eyes: Positive for discharge.       Corrective lenses.  Respiratory: Negative.   Cardiovascular: Positive for leg swelling. Negative for chest pain and palpitations.       Palpitations. Atrial fibrillation documented on EKG 03/29/16.  Gastrointestinal:       Frequent reflux and heartburn.  Endocrine:       History of elevations in glucose. Diet controlled.  Genitourinary: Positive for frequency.       Urinary leakage,  Musculoskeletal:       Chronic back pains. Right knee painful. Had surgery 07/04/13 by Dr. Ninfa Linden. Using tramadol. Having some left neck discomfort. Pain across the distal left metatarsals.  Skin: Negative.   Neurological: Positive for numbness. Negative for dizziness, tremors, seizures, syncope, facial asymmetry, speech difficulty, weakness, light-headedness and  headaches.       Episodes of numbness in the right hand.  Hematological: Negative.   Psychiatric/Behavioral: Negative.        Some difficulty falling asleep. Using less Ambien now.    Vitals:   12/12/16 1445  BP: (!) 156/98  Pulse: 63  Temp: 97.9 F (36.6 C)  TempSrc: Oral  SpO2: 99%  Weight: 184 lb (83.5 kg)  Height: 5' 3"  (1.6 m)   Body mass index is 32.59 kg/m. Wt Readings from Last 3 Encounters:  12/12/16 184 lb (83.5 kg)  12/12/16 184 lb 9.6 oz (83.7 kg)  10/11/16 186 lb (84.4 kg)      Physical Exam  Constitutional: She is oriented to person, place, and time. No distress.  Obese.  HENT:  Head: Normocephalic and atraumatic.  Right Ear: External ear normal.  Left Ear: External ear normal.  Eyes: Conjunctivae and EOM are normal. Pupils are equal, round, and reactive to light. Left eye exhibits no discharge. No scleral icterus.  Neck: Normal range of motion. Neck supple.  Cardiovascular: Normal rate, regular rhythm, normal heart sounds and intact distal pulses.   Pulmonary/Chest: Effort normal and breath sounds normal.  Pacemaker in left upper chest wall.  Abdominal: Soft. Bowel sounds are normal. She exhibits no distension. There is no tenderness.  Musculoskeletal: She exhibits no edema.  Right knee S/P TKR. Tenderness at the left metatarsal heads.   Neurological: She is alert and oriented to person, place, and time.  12/12/16 MMSE 30/30. Passed clock drawing.  Psychiatric: She has a normal mood and affect. Her behavior is normal. Judgment and thought content normal.    Labs reviewed: Lab Summary Latest Ref Rng & Units 06/15/2016  Hemoglobin 11.7 - 15.5 g/dL 13.7  Hematocrit 35.0 - 45.0 % 41.6  White count 3.8 - 10.8 K/uL 7.0  Platelet count 140 - 400 K/uL 201  Sodium 135 - 146 mmol/L 138  Potassium 3.5 - 5.3 mmol/L 4.0  Calcium 8.6 - 10.4 mg/dL 9.3  Phosphorus - (None)  Creatinine 0.60 - 0.88 mg/dL 0.85  AST - (None)  Alk Phos - (None)  Bilirubin -  (None)  Glucose 65 - 99 mg/dL 101(H)  Cholesterol - (None)  HDL cholesterol - (None)  Triglycerides - (None)  LDL Direct - (None)  LDL Calc - (None)  Total protein - (None)  Albumin - (None)  Some recent data might be hidden   Lab Results  Component Value Date  TSH 3.870 07/26/2015   Lab Results  Component Value Date   BUN 21 06/15/2016   BUN 19 04/26/2016   BUN 20 04/25/2016   Lab Results  Component Value Date   HGBA1C 5.6 03/27/2016   HGBA1C 5.8 (H) 11/26/2015   HGBA1C 5.7 (H) 07/26/2015    Assessment/Plan  1. Essential hypertension Concentrate on weight loss to lower BP - Comprehensive metabolic panel; Future  2. Paroxysmal atrial fibrillation (HCC) Currently in NSR  3. Edema, unspecified type improved  4. Gastroesophageal reflux disease with esophagitis The current medical regimen is effective;  continue present plan and medications.  5. Class 1 obesity due to excess calories with serious comorbidity and body mass index (BMI) of 32.0 to 32.9 in adult Concentrate on weight loss.  6. Hyperglycemia - Comprehensive metabolic panel; Future  7. HYPERCHOLESTEROLEMIA - Lipid panel; Future  8. Insomnia, unspecified type - traZODone (DESYREL) 50 MG tablet; One nightly to help sleep  Dispense: 30 tablet; Refill: 5

## 2016-12-12 NOTE — Progress Notes (Signed)
Quick Notes   Health Maintenance:   Pn23 given today; Up to date on maintenance   Abnormal Screen:  None; MMSE-30/30- Passed clock test   Patient Concerns:  Pt would like to for you to take over giving her Trazodone, instead of her getting it by Dr. Lucia Gaskins.     Nurse Concerns:   Bp: 170/100; Recheck: 156/98  I have reviewed the information entered by the Health Advisor. I was present in the office during the time of patient interaction and was available for consultation. I agree with the documentation and advice.  Viviann Spare Nyoka Cowden, MD

## 2016-12-12 NOTE — Patient Instructions (Addendum)
Ms. Entz , Thank you for taking time to come for your Medicare Wellness Visit. I appreciate your ongoing commitment to your health goals. Please review the following plan we discussed and let me know if I can assist you in the future.   These are the goals we discussed: Goals    . Increase water intake          Starting 12/12/16, I will attempt to increase my water intake to 5 glasses per day.        This is a list of the screening recommended for you and due dates:  Health Maintenance  Topic Date Due  . Tetanus Vaccine  05/10/1950  . Mammogram  03/24/2017  . Flu Shot  Completed  . DEXA scan (bone density measurement)  Completed  . Shingles Vaccine  Completed  . Pneumonia vaccines  Completed  Preventive Care for Adults  A healthy lifestyle and preventive care can promote health and wellness. Preventive health guidelines for adults include the following key practices.  . A routine yearly physical is a good way to check with your health care provider about your health and preventive screening. It is a chance to share any concerns and updates on your health and to receive a thorough exam.  . Visit your dentist for a routine exam and preventive care every 6 months. Brush your teeth twice a day and floss once a day. Good oral hygiene prevents tooth decay and gum disease.  . The frequency of eye exams is based on your age, health, family medical history, use  of contact lenses, and other factors. Follow your health care provider's ecommendations for frequency of eye exams.  . Eat a healthy diet. Foods like vegetables, fruits, whole grains, low-fat dairy products, and lean protein foods contain the nutrients you need without too many calories. Decrease your intake of foods high in solid fats, added sugars, and salt. Eat the right amount of calories for you. Get information about a proper diet from your health care provider, if necessary.  . Regular physical exercise is one of the most  important things you can do for your health. Most adults should get at least 150 minutes of moderate-intensity exercise (any activity that increases your heart rate and causes you to sweat) each week. In addition, most adults need muscle-strengthening exercises on 2 or more days a week.  Silver Sneakers may be a benefit available to you. To determine eligibility, you may visit the website: www.silversneakers.com or contact program at (604)672-8334 Mon-Fri between 8AM-8PM.   . Maintain a healthy weight. The body mass index (BMI) is a screening tool to identify possible weight problems. It provides an estimate of body fat based on height and weight. Your health care provider can find your BMI and can help you achieve or maintain a healthy weight.   For adults 20 years and older: ? A BMI below 18.5 is considered underweight. ? A BMI of 18.5 to 24.9 is normal. ? A BMI of 25 to 29.9 is considered overweight. ? A BMI of 30 and above is considered obese.   . Maintain normal blood lipids and cholesterol levels by exercising and minimizing your intake of saturated fat. Eat a balanced diet with plenty of fruit and vegetables. Blood tests for lipids and cholesterol should begin at age 77 and be repeated every 5 years. If your lipid or cholesterol levels are high, you are over 50, or you are at high risk for heart disease, you may need  your cholesterol levels checked more frequently. Ongoing high lipid and cholesterol levels should be treated with medicines if diet and exercise are not working.  . If you smoke, find out from your health care provider how to quit. If you do not use tobacco, please do not start.  . If you choose to drink alcohol, please do not consume more than 2 drinks per day. One drink is considered to be 12 ounces (355 mL) of beer, 5 ounces (148 mL) of wine, or 1.5 ounces (44 mL) of liquor.  . If you are 51-51 years old, ask your health care provider if you should take aspirin to prevent  strokes.  . Use sunscreen. Apply sunscreen liberally and repeatedly throughout the day. You should seek shade when your shadow is shorter than you. Protect yourself by wearing long sleeves, pants, a wide-brimmed hat, and sunglasses year round, whenever you are outdoors.  . Once a month, do a whole body skin exam, using a mirror to look at the skin on your back. Tell your health care provider of new moles, moles that have irregular borders, moles that are larger than a pencil eraser, or moles that have changed in shape or color.

## 2016-12-12 NOTE — Progress Notes (Signed)
Subjective:   [name] is a 82 y.o. female who presents for an Initial Medicare Annual Wellness Visit.  Review of Systems     Cardiac Risk Factors include: advanced age (>50men, >3 women);family history of premature cardiovascular disease;obesity (BMI >30kg/m2);hypertension     Objective:    Today's Vitals   12/12/16 1353  BP: (!) 170/100  Pulse: 63  Temp: 97.9 F (36.6 C)  TempSrc: Oral  SpO2: 99%  Weight: 184 lb 9.6 oz (83.7 kg)  Height: 5\' 3"  (1.6 m)  PainSc: 0-No pain   Body mass index is 32.7 kg/m.   Current Medications (verified) Outpatient Encounter Prescriptions as of 12/12/2016  Medication Sig  . acetaminophen (TYLENOL) 500 MG tablet Take 500 mg by mouth every 6 (six) hours as needed for mild pain.   Marland Kitchen ALPRAZolam (XANAX) 0.25 MG tablet Take one tablet by mouth at bedtime as needed for rest or anxiety  . antiseptic oral rinse (BIOTENE) LIQD 15 mLs by Mouth Rinse route daily as needed for dry mouth.  . bimatoprost (LUMIGAN) 0.01 % SOLN Instill 1 drop into both eyes once daily in the evening  . BIOTIN 5000 PO Take 1 capsule by mouth daily.  . Cholecalciferol (VITAMIN D) 2000 UNITS tablet Take 2,000 Units by mouth daily.  . dorzolamide (TRUSOPT) 2 % ophthalmic solution Place 1 drop into both eyes 3 (three) times daily.   Marland Kitchen ELIQUIS 5 MG TABS tablet TAKE 1 TABLET TWICE A DAY FOR ANTICOAGULATION  . furosemide (LASIX) 20 MG tablet Take 20 mg by mouth as needed for edema.   Marland Kitchen KETOPROFEN EX Apply 1 application topically daily as needed (pain). 20% cream; PRN for pain.  . methylcellulose (ARTIFICIAL TEARS) 1 % ophthalmic solution Place 1 drop into both eyes daily as needed (for dry eyes).   . metoprolol tartrate (LOPRESSOR) 50 MG tablet Take 1 tablet (50 mg total) by mouth 2 (two) times daily.  . pantoprazole (PROTONIX) 40 MG tablet TAKE 1 TABLET BY MOUTH NIGHTLY TO REDUCE STOMACH ACID  . propafenone (RYTHMOL SR) 225 MG 12 hr capsule Take 1 capsule (225 mg total) by  mouth 2 (two) times daily.  . traZODone (DESYREL) 50 MG tablet Take 25 mg by mouth at bedtime.  . valsartan-hydrochlorothiazide (DIOVAN-HCT) 320-25 MG tablet TAKE 1 TABLET BY MOUTH EVERY DAY  . [DISCONTINUED] diltiazem (CARDIZEM) 30 MG tablet Take 1 tablet by mouth every 6 hours as needed for fast heart rates (Patient taking differently: Take 1 tablet by mouth every 6 hours as needed for fast heart rates. Not currently taking at the moment until next F/U w/Dr. Lovena Le next week)   No facility-administered encounter medications on file as of 12/12/2016.     Allergies (verified) Advil [ibuprofen]; Aleve [naproxen sodium]; Aspirin; Atorvastatin; Celebrex [celecoxib]; Diclofenac; Doxycycline; Metoclopramide hcl; Nsaids; Other; Pravastatin; and Timolol   History: Past Medical History:  Diagnosis Date  . Allergic rhinitis due to pollen   . Anxiety state, unspecified   . Arthritis    "right knee" (06/21/2016)  . Asymptomatic varicose veins   . Carpal tunnel syndrome   . Chest pain, unspecified   . Chronic lower back pain    "on the left side" (06/21/2016)  . Cramp of limb   . Depressive disorder, not elsewhere classified   . Diaphragmatic hernia without mention of obstruction or gangrene   . Diaphragmatic hernia without mention of obstruction or gangrene   . Disturbance of skin sensation   . Diverticulosis of colon (without  mention of hemorrhage)   . Enthesopathy of hip region   . Esophageal reflux   . Hepatitis, unspecified   . History of blood transfusion    "w/both knee replacements"  . History of duodenal ulcer   . Insomnia, unspecified   . Lumbago   . Migraine    "none since ~ 1990" (06/21/2016)  . Myalgia and myositis, unspecified   . Obesity, unspecified   . Other abnormal blood chemistry   . Other dysphagia   . Other malaise and fatigue   . Other nonspecific abnormal serum enzyme levels   . Other specified cardiac dysrhythmias(427.89)   . Pacemaker   . Pain in joint, ankle  and foot   . Pain in joint, hand   . Pain in joint, lower leg   . Pain in joint, pelvic region and thigh   . Pain in limb   . Plantar fascial fibromatosis   . Presence of permanent cardiac pacemaker   . Reflux esophagitis   . Sciatica   . Spinal stenosis, unspecified region other than cervical   . Stricture and stenosis of esophagus   . Symptomatic menopausal or female climacteric states   . Unspecified essential hypertension   . Unspecified essential hypertension   . Unspecified vitamin D deficiency    Past Surgical History:  Procedure Laterality Date  . BREAST BIOPSY Left 1990s X 2  . CARDIOVERSION N/A 04/26/2016   Procedure: CARDIOVERSION;  Surgeon: Pixie Casino, MD;  Location: [facility];  Service: Cardiovascular;  Laterality: N/A;  . CATARACT EXTRACTION W/ INTRAOCULAR LENS IMPLANT Left 08/03/1999   DR EPES   . CATARACT EXTRACTION W/ INTRAOCULAR LENS IMPLANT Right 2000   DR EPES  . [place]  . COLONOSCOPY  1988   Normal   . DENTAL SURGERY Left 08/2016  . EP IMPLANTABLE DEVICE N/A 06/21/2016   Procedure: Pacemaker Implant;  Surgeon: Evans Lance, MD;  Location: [facility];  Service: Cardiovascular;  Laterality: N/A;  . ESOPHAGOGASTRODUODENOSCOPY (EGD) WITH ESOPHAGEAL DILATION  ~ 1982   Dr. Sharlett Iles  . EXCISION OF ACTINIC KERATOSIS     DR LUPTON   . EYE SURGERY    . INSERT / REPLACE / REMOVE PACEMAKER    . JOINT REPLACEMENT    . KNEE ARTHROSCOPY Left 2003  . KNEE ARTHROSCOPY Right 06-26-13  . KNEE CLOSED REDUCTION Right 10/23/2013   Procedure: CLOSED MANIPULATION RIGHT KNEE;  Surgeon: Mcarthur Rossetti, MD;  Location: [facility];  Service: Orthopedics;  Laterality: Right;  . LASER FOR CLOUDY CAP LEFT EYE Left 03/2006   DR DIGBY  . TOTAL KNEE ARTHROPLASTY Left 04/2004   DR RENDALL  . TOTAL KNEE ARTHROPLASTY Right 07/04/2013   Procedure: RIGHT TOTAL KNEE ARTHROPLASTY;  Surgeon: Mcarthur Rossetti, MD;  Location: [facility];   Service: Orthopedics;  Laterality: Right;  Marland Kitchen VAGINAL HYSTERECTOMY  1979   Family History  Problem Relation Age of Onset  . Ovarian cancer Mother   . Uterine cancer Mother   . Cerebrovascular Accident Mother   . Heart disease Father   . Hypertension Brother   . Obesity Daughter    Social History   Occupational History  . Not on file.   Social History Main Topics  . Smoking status: Never Smoker  . Smokeless tobacco: Never Used  . Alcohol use No  . Drug use: No  . Sexual activity: Yes    Tobacco Counseling Counseling given: No  Activities of Daily Living In your present state of health, do you have any difficulty performing the following activities: 12/12/2016 06/21/2016  Hearing? N -  Vision? Y -  Difficulty concentrating or making decisions? N -  Walking or climbing stairs? Y -  Dressing or bathing? N -  Doing errands, shopping? N N  Preparing Food and eating ? N -  Using the Toilet? N -  In the past six months, have you accidently leaked urine? Y -  Do you have problems with loss of bowel control? N -  Managing your Medications? N -  Managing your Finances? N -  Housekeeping or managing your Housekeeping? N -  Some recent data might be hidden    Immunizations and Health Maintenance Immunization History  Administered Date(s) Administered  . Influenza,inj,Quad PF,36+ Mos 09/23/2013, 09/25/2014, 10/11/2015, 08/30/2016  . Pneumococcal Conjugate-13 02/17/2015  . Pneumococcal Polysaccharide-23 12/12/2016  . Zoster 01/29/2008   Health Maintenance Due  Topic Date Due  . Samul Dada  05/10/1950    Patient Care Team: Estill Dooms, MD as PCP - General (Internal Medicine) Mcarthur Rossetti, MD as Attending Physician (Orthopedic Surgery) Larey Dresser, MD as Attending Physician (Cardiology) Thornell Sartorius, MD as Consulting Physician (Otolaryngology) Druscilla Brownie, MD as Consulting Physician (Dermatology) Calvert Cantor, MD as Consulting Physician  (Ophthalmology) Pixie Casino, MD as Consulting Physician (Cardiology)  Indicate any recent Medical Services you may have received from other than [facility] providers in the past year (date may be approximate).     Assessment:   This is a routine wellness examination for [place].   Hearing/Vision screen  Hearing Screening   125Hz  250Hz  500Hz  1000Hz  2000Hz  3000Hz  4000Hz  6000Hz  8000Hz   Right ear:   100 0 100  100    Left ear:   100 100 100  100    Comments: Pt states it has been years since last hearing screen; denies any hearing problems at this time.   Vision Screening Comments: Last eye exam w/ Dr. Bing Plume, in November 2017.   Dietary issues and exercise activities discussed: Current Exercise Habits: Structured exercise class, Type of exercise: stretching;walking;strength training/weights, Time (Minutes): 60, Frequency (Times/Week): 3, Weekly Exercise (Minutes/Week): 180, Intensity: Moderate  Goals    . Increase water intake          Starting 12/12/16, I will attempt to increase my water intake to 5 glasses per day.       Depression Screen PHQ 2/9 Scores 12/12/2016 05/30/2016 11/30/2015 07/28/2015 10/14/2014 09/22/2014 07/01/2013  PHQ - 2 Score 0 0 0 0 0 0 0    Fall Risk Fall Risk  12/12/2016 07/17/2016 05/30/2016 05/30/2016 05/30/2016  Falls in the past year? Yes No No No No  Number falls in past yr: 2 or more - - - -  Injury with Fall? No - - - -  Risk Factor Category  High Fall Risk - - - -  Risk for fall due to : Impaired balance/gait;History of fall(s) - - - -  Follow up Falls prevention discussed - - - -    Cognitive Function: MMSE - Mini Mental State Exam 12/12/2016 11/30/2015  Orientation to time 5 5  Orientation to Place 5 5  Registration 3 3  Attention/ Calculation 5 5  Recall 3 3  Language- name 2 objects 2 2  Language- repeat 1 1  Language- follow 3 step command 3 3  Language- read & follow direction 1 1  Write a sentence 1 1  Copy  design 1 1  Total score 30 30          Screening Tests Health Maintenance  Topic Date Due  . TETANUS/TDAP  05/10/1950  . MAMMOGRAM  03/24/2017  . INFLUENZA VACCINE  Completed  . DEXA SCAN  Completed  . ZOSTAVAX  Completed  . PNA vac Low Risk Adult  Completed      Plan:    I have personally reviewed and addressed the Medicare Annual Wellness questionnaire and have noted the following in the patient's chart:  A. Medical and social history B. Use of alcohol, tobacco or illicit drugs  C. Current medications and supplements D. Functional ability and status E.  Nutritional status F.  Physical activity G. Advance directives H. List of other physicians I.  Hospitalizations, surgeries, and ER visits in previous 12 months J.  [place] to include hearing, vision, cognitive, depression L. Referrals and appointments - none  In addition, I have reviewed and discussed with patient certain preventive protocols, quality metrics, and best practice recommendations. A written personalized care plan for preventive services as well as general preventive health recommendations were provided to patient.  See attached scanned questionnaire for additional information.   Signed,   Allyn Kenner, LPN Health Advisor   I have reviewed the information entered by the Health Advisor. I was present in the office during the time of patient interaction and was available for consultation. I agree with the documentation and advice.  Viviann Spare Nyoka Cowden, MD

## 2016-12-18 NOTE — Progress Notes (Signed)
Cardiology Office Note   Date:  12/22/2016   ID:  [name], DOB 07-27-1931, MRN [id number]  PCP:  Jeanmarie Hubert, MD  Cardiologist:   Peter Martinique, MD   Chief Complaint  Patient presents with  . Atrial Fibrillation      History of Present Illness: [name] is a 82 y.o. female is seen for follow up Afibflutter. She has a long history of marked sinus bradycardia. She was seen by Dr. Aundra Dubin in 2014. Echo at that time showed mild LAE otherwise normal. Holter showed mean HR 59 with lowest HR 43 and peak HR 109. She reports her HR is typically 48 but she was asymptomatic. On March 9,2017 she noted an increased HR by BP monitor up to 153. At this time she felt marked indigestion from her waist to her neck. She felt her heart fluttering.   She was seen by Dr. Nyoka Cowden on 03/29/16 and found to be in Afib with RVR. She was started on Eliquis and metoprolol. Myoview study and Echo were normal.  She later had attempt at DCCV. She was in an atypical atrial flutter at that time. DCCV resulted in very prolonged pauses > 6 seconds and return to flutter. She was seen by Dr. Curt Bears and placed on flecainide. This did convert her to NSR but made her feel very sick with nausea, dizziness, and extreme fatigue. Flecainide was discontinued.. She continued to have marked bradycardia and underwent PPM placement on 06/21/16. When seen in September by Dr. Lovena Le she had an Afib burden of 94%. She was started on propafenone for her Afib. Initially this medication caused her to have more nausea but this has improved. She still notes a little stomach upset. She states her rhythm has been well controlled. She thinks she has only been out of rhythm on 2 occasions when she was really tired and stressed.    Past Medical History:  Diagnosis Date  . Allergic rhinitis due to pollen   . Anxiety state, unspecified   . Arthritis    "right knee" (06/21/2016)  . Asymptomatic varicose veins   . Carpal tunnel syndrome   .  Chest pain, unspecified   . Chronic lower back pain    "on the left side" (06/21/2016)  . Cramp of limb   . Depressive disorder, not elsewhere classified   . Diaphragmatic hernia without mention of obstruction or gangrene   . Diaphragmatic hernia without mention of obstruction or gangrene   . Disturbance of skin sensation   . Diverticulosis of colon (without mention of hemorrhage)   . Enthesopathy of hip region   . Esophageal reflux   . Hepatitis, unspecified   . History of blood transfusion    "w/both knee replacements"  . History of duodenal ulcer   . Insomnia, unspecified   . Lumbago   . Migraine    "none since ~ 1990" (06/21/2016)  . Myalgia and myositis, unspecified   . Obesity, unspecified   . Other abnormal blood chemistry   . Other dysphagia   . Other malaise and fatigue   . Other nonspecific abnormal serum enzyme levels   . Other specified cardiac dysrhythmias(427.89)   . Pacemaker   . Pain in joint, ankle and foot   . Pain in joint, hand   . Pain in joint, lower leg   . Pain in joint, pelvic region and thigh   . Pain in limb   . Plantar fascial fibromatosis   . Presence of permanent  cardiac pacemaker   . Reflux esophagitis   . Sciatica   . Spinal stenosis, unspecified region other than cervical   . Stricture and stenosis of esophagus   . Symptomatic menopausal or female climacteric states   . Unspecified essential hypertension   . Unspecified essential hypertension   . Unspecified vitamin D deficiency     Past Surgical History:  Procedure Laterality Date  . BREAST BIOPSY Left 1990s X 2  . CARDIOVERSION N/A 04/26/2016   Procedure: CARDIOVERSION;  Surgeon: Pixie Casino, MD;  Location: [facility];  Service: Cardiovascular;  Laterality: N/A;  . CATARACT EXTRACTION W/ INTRAOCULAR LENS IMPLANT Left 08/03/1999   DR EPES   . CATARACT EXTRACTION W/ INTRAOCULAR LENS IMPLANT Right 2000   DR EPES  . [place]  . COLONOSCOPY  1988    Normal   . DENTAL SURGERY Left 08/2016  . EP IMPLANTABLE DEVICE N/A 06/21/2016   Procedure: Pacemaker Implant;  Surgeon: Evans Lance, MD;  Location: [facility];  Service: Cardiovascular;  Laterality: N/A;  . ESOPHAGOGASTRODUODENOSCOPY (EGD) WITH ESOPHAGEAL DILATION  ~ 1982   Dr. Sharlett Iles  . EXCISION OF ACTINIC KERATOSIS     DR LUPTON   . EYE SURGERY    . INSERT / REPLACE / REMOVE PACEMAKER    . JOINT REPLACEMENT    . KNEE ARTHROSCOPY Left 2003  . KNEE ARTHROSCOPY Right 06-26-13  . KNEE CLOSED REDUCTION Right 10/23/2013   Procedure: CLOSED MANIPULATION RIGHT KNEE;  Surgeon: Mcarthur Rossetti, MD;  Location: [facility];  Service: Orthopedics;  Laterality: Right;  . LASER FOR CLOUDY CAP LEFT EYE Left 03/2006   DR DIGBY  . TOTAL KNEE ARTHROPLASTY Left 04/2004   DR RENDALL  . TOTAL KNEE ARTHROPLASTY Right 07/04/2013   Procedure: RIGHT TOTAL KNEE ARTHROPLASTY;  Surgeon: Mcarthur Rossetti, MD;  Location: [facility];  Service: Orthopedics;  Laterality: Right;  Marland Kitchen VAGINAL HYSTERECTOMY  1979     Current Outpatient Prescriptions  Medication Sig Dispense Refill  . acetaminophen (TYLENOL) 500 MG tablet Take 500 mg by mouth every 6 (six) hours as needed for mild pain.     Marland Kitchen ALPRAZolam (XANAX) 0.25 MG tablet Take one tablet by mouth at bedtime as needed for rest or anxiety 30 tablet 1  . antiseptic oral rinse (BIOTENE) LIQD 15 mLs by Mouth Rinse route daily as needed for dry mouth.    . bimatoprost (LUMIGAN) 0.01 % SOLN Instill 1 drop into both eyes once daily in the evening    . BIOTIN 5000 PO Take 1 capsule by mouth daily.    . Cholecalciferol (VITAMIN D) 2000 UNITS tablet Take 2,000 Units by mouth daily.    . dorzolamide (TRUSOPT) 2 % ophthalmic solution Place 1 drop into both eyes 3 (three) times daily.     Marland Kitchen ELIQUIS 5 MG TABS tablet TAKE 1 TABLET TWICE A DAY FOR ANTICOAGULATION 60 tablet 5  . furosemide (LASIX) 20 MG tablet Take 20 mg by mouth as needed for edema.     Marland Kitchen KETOPROFEN  EX Apply 1 application topically daily as needed (pain). 20% cream; PRN for pain.    . methylcellulose (ARTIFICIAL TEARS) 1 % ophthalmic solution Place 1 drop into both eyes daily as needed (for dry eyes).     . metoprolol tartrate (LOPRESSOR) 50 MG tablet Take 1 tablet (50 mg total) by mouth 2 (two) times daily. 60 tablet 6  . pantoprazole (PROTONIX) 40 MG tablet TAKE  1 TABLET BY MOUTH NIGHTLY TO REDUCE STOMACH ACID 30 tablet 5  . propafenone (RYTHMOL SR) 225 MG 12 hr capsule Take 1 capsule (225 mg total) by mouth 2 (two) times daily. 60 capsule 11  . traZODone (DESYREL) 50 MG tablet One nightly to help sleep 30 tablet 5  . valsartan-hydrochlorothiazide (DIOVAN-HCT) 320-25 MG tablet TAKE 1 TABLET BY MOUTH EVERY DAY 30 tablet 4   No current facility-administered medications for this visit.     Allergies:   Advil [ibuprofen]; Aleve [naproxen sodium]; Aspirin; Atorvastatin; Celebrex [celecoxib]; Diclofenac; Doxycycline; Metoclopramide hcl; Nsaids; Other; Pravastatin; and Timolol    Social History:  The patient  reports that she has never smoked. She has never used smokeless tobacco. She reports that she does not drink alcohol or use drugs.   Family History:  The patient's family history includes Cerebrovascular Accident in her mother; Heart disease in her father; Hypertension in her brother; Obesity in her daughter; Ovarian cancer in her mother; Uterine cancer in her mother.    ROS:  Please see the history of present illness.   Otherwise, review of systems are positive for none.   All other systems are reviewed and negative.    PHYSICAL EXAM: VS:  BP 118/76   Pulse 63   Ht 5\' 3"  (1.6 m)   Wt 186 lb (84.4 kg)   BMI 32.95 kg/m  , BMI Body mass index is 32.95 kg/m. GEN: Well nourished, well developed, in no acute distress  HEENT: normal  Neck: no JVD, carotid bruits, or masses Cardiac:IRRR; no murmurs, rubs, or gallops,no edema  Respiratory:  clear to auscultation bilaterally, normal  work of breathing GI: soft, nontender, nondistended, + BS MS: no deformity or atrophy  Skin: warm and dry, no rash Neuro:  Strength and sensation are intact Psych: euthymic mood, full affect   EKG:  EKG is not ordered today.      Recent Labs: 03/27/2016: ALT 16 06/15/2016: BUN 21; Creat 0.85; Hemoglobin 13.7; Platelets 201; Potassium 4.0; Sodium 138    Lipid Panel    Component Value Date/Time   CHOL 179 03/27/2016 0853   TRIG 190 (H) 03/27/2016 0853   HDL 42 03/27/2016 0853   CHOLHDL 4.3 03/27/2016 0853   LDLCALC 99 03/27/2016 0853      Wt Readings from Last 3 Encounters:  12/22/16 186 lb (84.4 kg)  12/12/16 184 lb (83.5 kg)  12/12/16 184 lb 9.6 oz (83.7 kg)      Other studies Reviewed: Additional studies/ records that were reviewed today include:    ASSESSMENT AND PLAN:  1.  Atrial fibrillation/flutter with RVR. Unsuccessful DCCV with marked conversion pauses. She did convert on flecainide but unable to tolerate this medication. Now s/p PPM placement. Started on propafenone for Afib in September.  She is clearly doing better on medication. Will need to see what her device interrogation shows today.  If she continues to have frequent Afib could consider  a different AAD or ablation.   2. HTN well controlled.  3. Mild hypercholesterolemia.    Current medicines are reviewed at length with the patient today.  The patient does not have concerns regarding medicines.  The following changes have been made:    Labs/ tests ordered today include:   No orders of the defined types were placed in this encounter.    Disposition: follow up with me in 4 months.   Signed, Peter Martinique, MD,FACC  12/22/2016 11:30 AM    [address]  [place], [place], [place], [address] Phone [phone], Fax [phone]

## 2016-12-22 ENCOUNTER — Encounter: Payer: Self-pay | Admitting: Cardiology

## 2016-12-22 ENCOUNTER — Ambulatory Visit (INDEPENDENT_AMBULATORY_CARE_PROVIDER_SITE_OTHER): Payer: Medicare Other | Admitting: Cardiology

## 2016-12-22 ENCOUNTER — Ambulatory Visit (INDEPENDENT_AMBULATORY_CARE_PROVIDER_SITE_OTHER): Payer: Medicare Other | Admitting: *Deleted

## 2016-12-22 VITALS — BP 118/76 | HR 63 | Ht 63.0 in | Wt 186.0 lb

## 2016-12-22 DIAGNOSIS — I10 Essential (primary) hypertension: Secondary | ICD-10-CM

## 2016-12-22 DIAGNOSIS — I480 Paroxysmal atrial fibrillation: Secondary | ICD-10-CM

## 2016-12-22 DIAGNOSIS — I481 Persistent atrial fibrillation: Secondary | ICD-10-CM

## 2016-12-22 DIAGNOSIS — I4819 Other persistent atrial fibrillation: Secondary | ICD-10-CM

## 2016-12-22 NOTE — Progress Notes (Signed)
Remote pacemaker transmission.   

## 2016-12-22 NOTE — Patient Instructions (Signed)
Continue your current therapy  I will see you in 4 months  

## 2016-12-29 ENCOUNTER — Encounter: Payer: Self-pay | Admitting: Cardiology

## 2016-12-29 LAB — CUP PACEART REMOTE DEVICE CHECK
Battery Voltage: 3.01 V
Brady Statistic AP VP Percent: 1 %
Brady Statistic AP VS Percent: 14 %
Brady Statistic AS VP Percent: 10 %
Brady Statistic AS VS Percent: 74 %
Date Time Interrogation Session: 20171228215145
Implantable Lead Implant Date: 20170628
Implantable Lead Location: 753859
Implantable Lead Location: 753860
Implantable Pulse Generator Implant Date: 20170628
Lead Channel Impedance Value: 540 Ohm
Lead Channel Impedance Value: 540 Ohm
Lead Channel Pacing Threshold Pulse Width: 0.4 ms
Lead Channel Sensing Intrinsic Amplitude: 2.4 mV
Lead Channel Setting Pacing Amplitude: 3.5 V
Lead Channel Setting Sensing Sensitivity: 2 mV
MDC IDC LEAD IMPLANT DT: 20170628
MDC IDC MSMT BATTERY REMAINING LONGEVITY: 103 mo
MDC IDC MSMT BATTERY REMAINING PERCENTAGE: 95.5 %
MDC IDC MSMT LEADCHNL RA SENSING INTR AMPL: 2.5 mV
MDC IDC MSMT LEADCHNL RV PACING THRESHOLD AMPLITUDE: 0.75 V
MDC IDC SET LEADCHNL RV PACING AMPLITUDE: 2.5 V
MDC IDC SET LEADCHNL RV PACING PULSEWIDTH: 0.4 ms
MDC IDC STAT BRADY RA PERCENT PACED: 14 %
MDC IDC STAT BRADY RV PERCENT PACED: 11 %
Pulse Gen Model: 2272
Pulse Gen Serial Number: 7910313

## 2017-01-01 ENCOUNTER — Ambulatory Visit (INDEPENDENT_AMBULATORY_CARE_PROVIDER_SITE_OTHER): Payer: Self-pay

## 2017-01-01 DIAGNOSIS — M779 Enthesopathy, unspecified: Secondary | ICD-10-CM

## 2017-01-01 NOTE — Progress Notes (Signed)
Patient presents for orthotic pick up.  Verbal and written break in and wear instructions given.  Patient will follow up in 4 weeks if symptoms worsen or fail to improve. 

## 2017-01-01 NOTE — Patient Instructions (Signed)

## 2017-01-09 ENCOUNTER — Other Ambulatory Visit: Payer: Self-pay | Admitting: *Deleted

## 2017-01-09 DIAGNOSIS — I4819 Other persistent atrial fibrillation: Secondary | ICD-10-CM

## 2017-01-09 MED ORDER — APIXABAN 5 MG PO TABS: ORAL_TABLET | ORAL | 3 refills | Status: DC

## 2017-01-09 NOTE — Telephone Encounter (Signed)
[facility] 

## 2017-01-10 ENCOUNTER — Other Ambulatory Visit: Payer: Self-pay | Admitting: Physician Assistant

## 2017-01-12 NOTE — Telephone Encounter (Signed)
Rx refill sent to pharmacy. 

## 2017-01-23 ENCOUNTER — Ambulatory Visit: Payer: Medicare Other

## 2017-01-23 DIAGNOSIS — M779 Enthesopathy, unspecified: Secondary | ICD-10-CM

## 2017-02-02 NOTE — Progress Notes (Signed)
Pt returned orthotics, she has various cardiovascular issues. The orthotics were causing her to feel unsteady on her feet, she states that her cardiologist recommended she not use them due to fall risk. Spoke with Shelly, inserts were returned and she is to follow up with the patient in regards to a refund

## 2017-02-05 ENCOUNTER — Other Ambulatory Visit: Payer: Self-pay | Admitting: *Deleted

## 2017-02-05 DIAGNOSIS — I4819 Other persistent atrial fibrillation: Secondary | ICD-10-CM

## 2017-02-05 MED ORDER — APIXABAN 5 MG PO TABS: ORAL_TABLET | ORAL | 3 refills | Status: DC

## 2017-02-05 NOTE — Telephone Encounter (Signed)
[facility] 

## 2017-02-15 DIAGNOSIS — Z9189 Other specified personal risk factors, not elsewhere classified: Secondary | ICD-10-CM | POA: Diagnosis not present

## 2017-02-22 ENCOUNTER — Other Ambulatory Visit: Payer: Self-pay | Admitting: Internal Medicine

## 2017-02-24 ENCOUNTER — Other Ambulatory Visit: Payer: Self-pay | Admitting: Internal Medicine

## 2017-02-28 DIAGNOSIS — H04123 Dry eye syndrome of bilateral lacrimal glands: Secondary | ICD-10-CM | POA: Diagnosis not present

## 2017-03-05 ENCOUNTER — Ambulatory Visit (INDEPENDENT_AMBULATORY_CARE_PROVIDER_SITE_OTHER): Payer: Medicare Other | Admitting: Physician Assistant

## 2017-03-07 ENCOUNTER — Encounter (INDEPENDENT_AMBULATORY_CARE_PROVIDER_SITE_OTHER): Payer: Self-pay | Admitting: Physician Assistant

## 2017-03-07 ENCOUNTER — Ambulatory Visit (INDEPENDENT_AMBULATORY_CARE_PROVIDER_SITE_OTHER): Payer: Medicare Other | Admitting: Physician Assistant

## 2017-03-07 DIAGNOSIS — M5441 Lumbago with sciatica, right side: Secondary | ICD-10-CM | POA: Diagnosis not present

## 2017-03-07 DIAGNOSIS — M7061 Trochanteric bursitis, right hip: Secondary | ICD-10-CM | POA: Diagnosis not present

## 2017-03-07 NOTE — Progress Notes (Signed)
Office Visit Note   Patient: [name]           Date of Birth: 10/20/1931           MRN: [id number] Visit Date: 03/07/2017              Requested by: Estill Dooms, MD [address] [place], [place] [address] PCP: Jeanmarie Hubert, MD   Assessment & Plan: Visit Diagnoses:  1. Bilateral low back pain with right-sided sciatica, unspecified chronicity   2. Trochanteric bursitis, right hip     Plan: We'll have Ms. Selin talk to her cardiologist about a right L5 transforaminal injection even though she is on Eliquis. Personally spoken with Dr. Ernestina Patches he says there is a slight risks with the 2 knees but that transforaminal is a lower risk be happy to talk to Mrs. Cerro about performing transforaminal injection if she cannot come off her blood thinner. She will also discuss with her cardiologist if she can have cortisone injection for the right trochanteric hip bursitis and she states that she was told to take no corticosteroids since having pacemaker. 2 old I No contraindications but definitely speak with him again prior to performing this. She'll let us know how she wishes to proceed after talking with her cardiologist. Damaris Schooner with her about IT band stretching and she does have perform this on her own.  Follow-Up Instructions: Return if symptoms worsen or fail to improve.   Orders:  No orders of the defined types were placed in this encounter.  No orders of the defined types were placed in this encounter.     Procedures: No procedures performed   Clinical Data: No additional findings.   Subjective: Chief Complaint  Patient presents with  . Lower Back - Pain  . Right Leg - Pain    HPI [place]'s well known to the practice comes in today with right knee leg and low back pain. Pains worse is that she's sitting for long prolonged period time date surgery right leg sometimes feels numb tingling in the thigh. Pain overall in low back and then down the legs is improved with  Tylenol however she has a trip planned to [place] and wants to be skittish 82 go to [place]. She's having pain in her right hip whenever she lies on the hip. Sine we last saw her she had a pacemaker due to her reported A. fib and atypical atrial flutter she is now on a blood thinner. She has had no change in bowel /bladder function. She last had a transforaminal injection at L5 with Dr. Ernestina Patches in 2016 and has done well until recently. Review of Systems Positive for right knee pain right leg numbness tingling. Denies any change in bowel or bladder function. Objective: Vital Signs: There were no vitals taken for this visit.  Physical Exam  well-developed well-nourished female in no acute distress. Affect appropriate. Psychiatric alert 3. Ortho Exam  good range of motion of bilateral hips without pain. Tenderness over the right trochanteric region. Negative straight leg raise on the left positive on the right. Tenderness in the lower lumbar paraspinous region bilaterally. Bilateral knees she has good range of motion of both knees without pain. No instability valgus varus resting of either knee no rashes skin lesions ulcerations erythema or effusion of either knee. Specialty Comments:  No specialty comments available.  Imaging: No results found.   PMFS History: Patient Active Problem List   Diagnosis Date Noted  . Metatarsalgia of  left foot 08/30/2016  . Flu vaccine need 08/30/2016  . Cardiac pacemaker in situ 06/21/2016  . Atypical atrial flutter ([facility])   . Chest pain 04/10/2016  . Atrial fibrillation ([facility]) 03/29/2016  . Hyperglycemia 02/17/2015  . Obese 02/17/2015  . Cough 02/17/2015  . Insomnia 01/27/2014  . 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 Knee joint replacement by other means 10/01/2013  . Muscle spasm 10/01/2013  . Arthritis of knee, right 07/04/2013  . Osteoarthritis of right knee 07/01/2013  . Cervicalgia 07/01/2013  . Bradycardia 05/13/2013  . HYPERCHOLESTEROLEMIA  06/02/2009  . ANXIETY 06/02/2009  . Essential hypertension 06/02/2009  . ESOPHAGEAL STRICTURE 06/02/2009  . GERD 06/02/2009  . HIATAL HERNIA 06/02/2009  . DIVERTICULOSIS, COLON 06/02/2009  . Dysphagia, pharyngoesophageal phase 06/02/2009  . Backache 05/01/2007  . LEG PAIN, RIGHT 05/01/2007  . HIP PAIN, RIGHT 03/27/2007  . GREATER TROCHANTERIC BURSITIS 03/27/2007   Past Medical History:  Diagnosis Date  . Allergic rhinitis due to pollen   . Anxiety state, unspecified   . Arthritis    "right knee" (06/21/2016)  . Asymptomatic varicose veins   . Carpal tunnel syndrome   . Chest pain, unspecified   . Chronic lower back pain    "on the left side" (06/21/2016)  . Cramp of limb   . Depressive disorder, not elsewhere classified   . Diaphragmatic hernia without mention of obstruction or gangrene   . Diaphragmatic hernia without mention of obstruction or gangrene   . Disturbance of skin sensation   . Diverticulosis of colon (without mention of hemorrhage)   . Enthesopathy of hip region   . Esophageal reflux   . Hepatitis, unspecified   . History of blood transfusion    "w/both knee replacements"  . History of duodenal ulcer   . Insomnia, unspecified   . Lumbago   . Migraine    "none since ~ 1990" (06/21/2016)  . Myalgia and myositis, unspecified   . Obesity, unspecified   . Other abnormal blood chemistry   . Other dysphagia   . Other malaise and fatigue   . Other nonspecific abnormal serum enzyme levels   . Other specified cardiac dysrhythmias(427.89)   . Pacemaker   . Pain in joint, ankle and foot   . Pain in joint, hand   . Pain in joint, lower leg   . Pain in joint, pelvic region and thigh   . Pain in limb   . Plantar fascial fibromatosis   . Presence of permanent cardiac pacemaker   . Reflux esophagitis   . Sciatica   . Spinal stenosis, unspecified region other than cervical   . Stricture and stenosis of esophagus   . Symptomatic menopausal or female climacteric  states   . Unspecified essential hypertension   . Unspecified essential hypertension   . Unspecified vitamin D deficiency     Family History  Problem Relation Age of Onset  . Ovarian cancer Mother   . Uterine cancer Mother   . Cerebrovascular Accident Mother   . Heart disease Father   . Hypertension Brother   . Obesity Daughter     Past Surgical History:  Procedure Laterality Date  . BREAST BIOPSY Left 1990s X 2  . CARDIOVERSION N/A 04/26/2016   Procedure: CARDIOVERSION;  Surgeon: Pixie Casino, MD;  Location: [facility];  Service: Cardiovascular;  Laterality: N/A;  . CATARACT EXTRACTION W/ INTRAOCULAR LENS IMPLANT Left 08/03/1999   DR EPES   . CATARACT EXTRACTION W/ INTRAOCULAR LENS IMPLANT Right 2000  DR EPES  . [place]  . COLONOSCOPY  1988   Normal   . DENTAL SURGERY Left 08/2016  . EP IMPLANTABLE DEVICE N/A 06/21/2016   Procedure: Pacemaker Implant;  Surgeon: Evans Lance, MD;  Location: [facility];  Service: Cardiovascular;  Laterality: N/A;  . ESOPHAGOGASTRODUODENOSCOPY (EGD) WITH ESOPHAGEAL DILATION  ~ 1982   Dr. Sharlett Iles  . EXCISION OF ACTINIC KERATOSIS     DR LUPTON   . EYE SURGERY    . INSERT / REPLACE / REMOVE PACEMAKER    . JOINT REPLACEMENT    . KNEE ARTHROSCOPY Left 2003  . KNEE ARTHROSCOPY Right 06-26-13  . KNEE CLOSED REDUCTION Right 10/23/2013   Procedure: CLOSED MANIPULATION RIGHT KNEE;  Surgeon: Mcarthur Rossetti, MD;  Location: [facility];  Service: Orthopedics;  Laterality: Right;  . LASER FOR CLOUDY CAP LEFT EYE Left 03/2006   DR DIGBY  . TOTAL KNEE ARTHROPLASTY Left 04/2004   DR RENDALL  . TOTAL KNEE ARTHROPLASTY Right 07/04/2013   Procedure: RIGHT TOTAL KNEE ARTHROPLASTY;  Surgeon: Mcarthur Rossetti, MD;  Location[facility];  Service: Orthopedics;  Laterality: Right;  Marland Kitchen VAGINAL HYSTERECTOMY  1979   Social History   Occupational History  . Not on file.   Social History Main Topics  . Smoking  status: Never Smoker  . Smokeless tobacco: Never Used  . Alcohol use No  . Drug use: No  . Sexual activity: Yes

## 2017-03-08 ENCOUNTER — Ambulatory Visit (INDEPENDENT_AMBULATORY_CARE_PROVIDER_SITE_OTHER): Payer: Medicare Other | Admitting: Physician Assistant

## 2017-03-11 ENCOUNTER — Other Ambulatory Visit: Payer: Self-pay | Admitting: Internal Medicine

## 2017-03-15 DIAGNOSIS — H40013 Open angle with borderline findings, low risk, bilateral: Secondary | ICD-10-CM | POA: Diagnosis not present

## 2017-03-15 DIAGNOSIS — H04123 Dry eye syndrome of bilateral lacrimal glands: Secondary | ICD-10-CM | POA: Diagnosis not present

## 2017-03-26 ENCOUNTER — Ambulatory Visit (INDEPENDENT_AMBULATORY_CARE_PROVIDER_SITE_OTHER): Payer: Medicare Other | Admitting: *Deleted

## 2017-03-26 ENCOUNTER — Telehealth: Payer: Self-pay | Admitting: Cardiology

## 2017-03-26 DIAGNOSIS — I481 Persistent atrial fibrillation: Secondary | ICD-10-CM | POA: Diagnosis not present

## 2017-03-26 DIAGNOSIS — I4819 Other persistent atrial fibrillation: Secondary | ICD-10-CM

## 2017-03-26 NOTE — Telephone Encounter (Signed)
LMOV[facility] reminding pt to send remote transmission.   

## 2017-03-26 NOTE — Progress Notes (Signed)
Remote pacemaker transmission.   

## 2017-03-27 ENCOUNTER — Encounter: Payer: Self-pay | Admitting: Cardiology

## 2017-03-27 NOTE — Progress Notes (Signed)
Cardiology Office Note   Date:  03/28/2017   ID:  [name], DOB 08-30-1931, MRN [id number]  PCP:  Jeanmarie Hubert, MD  Cardiologist:   Beadie Matsunaga [place], MD   Chief Complaint  Patient presents with  . Atrial Fibrillation      History of Present Illness: [name] is a 83 y.o. female is seen for follow up Afibflutter. She has a long history of marked sinus bradycardia. She was seen by Dr. Aundra Dubin in 2014. Echo at that time showed mild LAE otherwise normal. Holter showed mean HR 59 with lowest HR 43 and peak HR 109. She reports her HR is typically 48 but she was asymptomatic. On March 9,2017 she noted an increased HR by BP monitor up to 153. At this time she felt marked indigestion from her waist to her neck. She felt her heart fluttering.   She was seen by Dr. Nyoka Cowden on 03/29/16 and found to be in Afib with RVR. She was started on Eliquis and metoprolol. Myoview study and Echo were normal.  She later had attempt at DCCV. She was in an atypical atrial flutter at that time. DCCV resulted in very prolonged pauses > 6 seconds and return to flutter. She was seen by Dr. Curt Bears and placed on flecainide. This did convert her to NSR but made her feel very sick with nausea, dizziness, and extreme fatigue. Flecainide was discontinued.. She continued to have marked bradycardia and underwent PPM placement on 06/21/16. When seen in September by Dr. Lovena Le she had an Afib burden of 94%. She was started on propafenone for her Afib. Initially this medication caused her to have more nausea but this has improved. Her last pacer check showed a reduction in AFib burden to 56%.   She was recently seen by ortho with back and right hip pain. L5 and right hip injection recommended but she didn't want this done until she talked to me. Also seen by Dr. Bing Plume who recommended Tobradex eye drops but she was worried about taking a steroid drop. She is concerned about taking steroids. Thinks she was told to avoid this before  but I can find no documentation of this. She does have some intermittent heaviness in her chest and feels tired a lot but overall feels OK. Planning on a trip to [place] this summer.    Past Medical History:  Diagnosis Date  . Allergic rhinitis due to pollen   . Anxiety state, unspecified   . Arthritis    "right knee" (06/21/2016)  . Asymptomatic varicose veins   . Carpal tunnel syndrome   . Chest pain, unspecified   . Chronic lower back pain    "on the left side" (06/21/2016)  . Cramp of limb   . Depressive disorder, not elsewhere classified   . Diaphragmatic hernia without mention of obstruction or gangrene   . Diaphragmatic hernia without mention of obstruction or gangrene   . Disturbance of skin sensation   . Diverticulosis of colon (without mention of hemorrhage)   . Enthesopathy of hip region   . Esophageal reflux   . Hepatitis, unspecified   . History of blood transfusion    "w/both knee replacements"  . History of duodenal ulcer   . Insomnia, unspecified   . Lumbago   . Migraine    "none since ~ 1990" (06/21/2016)  . Myalgia and myositis, unspecified   . Obesity, unspecified   . Other abnormal blood chemistry   . Other dysphagia   .  Other malaise and fatigue   . Other nonspecific abnormal serum enzyme levels   . Other specified cardiac dysrhythmias(427.89)   . Pacemaker   . Pain in joint, ankle and foot   . Pain in joint, hand   . Pain in joint, lower leg   . Pain in joint, pelvic region and thigh   . Pain in limb   . Plantar fascial fibromatosis   . Presence of permanent cardiac pacemaker   . Reflux esophagitis   . Sciatica   . Spinal stenosis, unspecified region other than cervical   . Stricture and stenosis of esophagus   . Symptomatic menopausal or female climacteric states   . Unspecified essential hypertension   . Unspecified essential hypertension   . Unspecified vitamin D deficiency     Past Surgical History:  Procedure Laterality Date  . BREAST  BIOPSY Left 1990s X 2  . CARDIOVERSION N/A 04/26/2016   Procedure: CARDIOVERSION;  Surgeon: Pixie Casino, MD;  Location: [facility];  Service: Cardiovascular;  Laterality: N/A;  . CATARACT EXTRACTION W/ INTRAOCULAR LENS IMPLANT Left 08/03/1999   DR EPES   . CATARACT EXTRACTION W/ INTRAOCULAR LENS IMPLANT Right 2000   DR EPES  . [place]  . COLONOSCOPY  1988   Normal   . DENTAL SURGERY Left 08/2016  . EP IMPLANTABLE DEVICE N/A 06/21/2016   Procedure: Pacemaker Implant;  Surgeon: Evans Lance, MD;  Location: [facility];  Service: Cardiovascular;  Laterality: N/A;  . ESOPHAGOGASTRODUODENOSCOPY (EGD) WITH ESOPHAGEAL DILATION  ~ 1982   Dr. Sharlett Iles  . EXCISION OF ACTINIC KERATOSIS     DR LUPTON   . EYE SURGERY    . INSERT / REPLACE / REMOVE PACEMAKER    . JOINT REPLACEMENT    . KNEE ARTHROSCOPY Left 2003  . KNEE ARTHROSCOPY Right 06-26-13  . KNEE CLOSED REDUCTION Right 10/23/2013   Procedure: CLOSED MANIPULATION RIGHT KNEE;  Surgeon: Mcarthur Rossetti, MD;  Location: [facility];  Service: Orthopedics;  Laterality: Right;  . LASER FOR CLOUDY CAP LEFT EYE Left 03/2006   DR DIGBY  . TOTAL KNEE ARTHROPLASTY Left 04/2004   DR RENDALL  . TOTAL KNEE ARTHROPLASTY Right 07/04/2013   Procedure: RIGHT TOTAL KNEE ARTHROPLASTY;  Surgeon: Mcarthur Rossetti, MD;  Location: [facility];  Service: Orthopedics;  Laterality: Right;  Marland Kitchen VAGINAL HYSTERECTOMY  1979     Current Outpatient Prescriptions  Medication Sig Dispense Refill  . acetaminophen (TYLENOL) 500 MG tablet Take 500 mg by mouth every 6 (six) hours as needed for mild pain.     Marland Kitchen ALPRAZolam (XANAX) 0.25 MG tablet Take one tablet by mouth at bedtime as needed for rest or anxiety 30 tablet 1  . antiseptic oral rinse (BIOTENE) LIQD 15 mLs by Mouth Rinse route daily as needed for dry mouth.    Marland Kitchen apixaban (ELIQUIS) 5 MG TABS tablet Take one tablet by mouth twice daily for anticoagulation 180 tablet 3  .  bimatoprost (LUMIGAN) 0.01 % SOLN Instill 1 drop into both eyes once daily in the evening    . BIOTIN 5000 PO Take 1 capsule by mouth daily.    . Cholecalciferol (VITAMIN D) 2000 UNITS tablet Take 2,000 Units by mouth daily.    . dorzolamide (TRUSOPT) 2 % ophthalmic solution Place 1 drop into both eyes 3 (three) times daily.     . furosemide (LASIX) 20 MG tablet Take 20 mg by mouth as needed for  edema.     Marland Kitchen KETOPROFEN EX Apply 1 application topically daily as needed (pain). 20% cream; PRN for pain.    . methylcellulose (ARTIFICIAL TEARS) 1 % ophthalmic solution Place 1 drop into both eyes daily as needed (for dry eyes).     . metoprolol (LOPRESSOR) 50 MG tablet TAKE 1 TABLET BY MOUTH TWICE A DAY 60 tablet 3  . pantoprazole (PROTONIX) 40 MG tablet TAKE 1 TABLET BY MOUTH NIGHTLY TO REDUCE STOMACH ACID 30 tablet 5  . propafenone (RYTHMOL SR) 225 MG 12 hr capsule Take 1 capsule (225 mg total) by mouth 2 (two) times daily. 60 capsule 11  . traZODone (DESYREL) 50 MG tablet One nightly to help sleep 30 tablet 5  . valsartan-hydrochlorothiazide (DIOVAN-HCT) 320-25 MG tablet TAKE 1 TABLET BY MOUTH EVERY DAY 30 tablet 0   No current facility-administered medications for this visit.     Allergies:   Advil [ibuprofen]; Aleve [naproxen sodium]; Aspirin; Atorvastatin; Celebrex [celecoxib]; Diclofenac; Doxycycline; Metoclopramide hcl; Nsaids; Other; Pravastatin; and Timolol    Social History:  The patient  reports that she has never smoked. She has never used smokeless tobacco. She reports that she does not drink alcohol or use drugs.   Family History:  The patient's family history includes Cerebrovascular Accident in her mother; Heart disease in her father; Hypertension in her brother; Obesity in her daughter; Ovarian cancer in her mother; Uterine cancer in her mother.    ROS:  Please see the history of present illness.   Otherwise, review of systems are positive for none.   All other systems are reviewed  and negative.    PHYSICAL EXAM: VS:  BP (!) 148/77 (BP Location: Right Arm)   Pulse 63   Ht 5' 1.5" (1.562 m)   Wt 184 lb (83.5 kg)   BMI 34.20 kg/m  , BMI Body mass index is 34.2 kg/m. GEN: Well nourished, well developed, in no acute distress  HEENT: normal  Neck: no JVD, carotid bruits, or masses Cardiac:IRRR; no murmurs, rubs, or gallops,no edema  Respiratory:  clear to auscultation bilaterally, normal work of breathing GI: soft, nontender, nondistended, + BS MS: no deformity or atrophy  Skin: warm and dry, no rash Neuro:  Strength and sensation are intact Psych: euthymic mood, full affect   EKG:  EKG is not ordered today.      Recent Labs: 06/15/2016: BUN 21; Creat 0.85; Hemoglobin 13.7; Platelets 201; Potassium 4.0; Sodium 138    Lipid Panel    Component Value Date/Time   CHOL 179 03/27/2016 0853   TRIG 190 (H) 03/27/2016 0853   HDL 42 03/27/2016 0853   CHOLHDL 4.3 03/27/2016 0853   LDLCALC 99 03/27/2016 0853      Wt Readings from Last 3 Encounters:  03/28/17 184 lb (83.5 kg)  12/22/16 186 lb (84.4 kg)  12/12/16 184 lb (83.5 kg)      Other studies Reviewed: Additional studies/ records that were reviewed today include:    ASSESSMENT AND PLAN:  1.  Atrial fibrillation/flutter with RVR. Unsuccessful DCCV with marked conversion pauses. She did convert on flecainide but unable to tolerate this medication. Now s/p PPM placement. Started on propafenone for Afib in September. Afib burden has decreased but is still happening 56%.  She is clearly doing better on medication.  I suspect some of her residual chest heaviness and fatigue are still related to her afib.   2. HTN well controlled.  3. Mild hypercholesterolemia.   4. Chest heaviness- negative myoview  and Echo one year ago. Will monitor for worsening symptoms  I told her it was OK to take steroid eye drops. She is also clear for hip injection since this could be done on Eliquis. She is not willing to  come off anticoagulation for spinal injection.   Current medicines are reviewed at length with the patient today.  The patient does not have concerns regarding medicines.  The following changes have been made:    Labs/ tests ordered today include:   No orders of the defined types were placed in this encounter.    Disposition: follow up with me in 4 months.   Signed, Jasun Gasparini [place], [place]  03/28/2017 6:06 PM    [facility] [address] [place], [place], [address] Phone [phone], Fax [phone]

## 2017-03-28 ENCOUNTER — Encounter: Payer: Self-pay | Admitting: Cardiology

## 2017-03-28 ENCOUNTER — Ambulatory Visit (INDEPENDENT_AMBULATORY_CARE_PROVIDER_SITE_OTHER): Payer: Medicare Other | Admitting: Cardiology

## 2017-03-28 VITALS — BP 148/77 | HR 63 | Ht 61.5 in | Wt 184.0 lb

## 2017-03-28 DIAGNOSIS — I480 Paroxysmal atrial fibrillation: Secondary | ICD-10-CM

## 2017-03-28 DIAGNOSIS — I10 Essential (primary) hypertension: Secondary | ICD-10-CM | POA: Diagnosis not present

## 2017-03-28 DIAGNOSIS — R072 Precordial pain: Secondary | ICD-10-CM | POA: Diagnosis not present

## 2017-03-28 LAB — CUP PACEART REMOTE DEVICE CHECK
Battery Voltage: 2.99 V
Brady Statistic AS VP Percent: 6.5 %
Brady Statistic RA Percent Paced: 42 %
Brady Statistic RV Percent Paced: 7.1 %
Date Time Interrogation Session: 20180402165506
Implantable Lead Implant Date: 20170628
Implantable Lead Location: 753859
Implantable Lead Location: 753860
Implantable Pulse Generator Implant Date: 20170628
Lead Channel Impedance Value: 530 Ohm
Lead Channel Impedance Value: 530 Ohm
Lead Channel Sensing Intrinsic Amplitude: 2.9 mV
Lead Channel Sensing Intrinsic Amplitude: 3.3 mV
Lead Channel Setting Pacing Pulse Width: 0.4 ms
Lead Channel Setting Sensing Sensitivity: 2 mV
MDC IDC LEAD IMPLANT DT: 20170628
MDC IDC MSMT BATTERY REMAINING LONGEVITY: 92 mo
MDC IDC MSMT BATTERY REMAINING PERCENTAGE: 95.5 %
MDC IDC MSMT LEADCHNL RV PACING THRESHOLD AMPLITUDE: 0.75 V
MDC IDC MSMT LEADCHNL RV PACING THRESHOLD PULSEWIDTH: 0.4 ms
MDC IDC SET LEADCHNL RA PACING AMPLITUDE: 3.5 V
MDC IDC SET LEADCHNL RV PACING AMPLITUDE: 2.5 V
MDC IDC STAT BRADY AP VP PERCENT: 1 %
MDC IDC STAT BRADY AP VS PERCENT: 42 %
MDC IDC STAT BRADY AS VS PERCENT: 50 %
Pulse Gen Model: 2272
Pulse Gen Serial Number: 7910313

## 2017-03-28 NOTE — Patient Instructions (Signed)
Continue your current therapy  I will see you in 6 months.  You are OK to take Tobradex eye drops and have a steroid injection for your hips.

## 2017-03-29 DIAGNOSIS — Z803 Family history of malignant neoplasm of breast: Secondary | ICD-10-CM | POA: Diagnosis not present

## 2017-03-29 DIAGNOSIS — Z1231 Encounter for screening mammogram for malignant neoplasm of breast: Secondary | ICD-10-CM | POA: Diagnosis not present

## 2017-03-29 LAB — HM MAMMOGRAPHY

## 2017-04-09 ENCOUNTER — Encounter: Payer: Self-pay | Admitting: *Deleted

## 2017-04-10 ENCOUNTER — Encounter: Payer: Self-pay | Admitting: Cardiology

## 2017-04-11 ENCOUNTER — Encounter: Payer: Self-pay | Admitting: Internal Medicine

## 2017-04-11 ENCOUNTER — Ambulatory Visit (INDEPENDENT_AMBULATORY_CARE_PROVIDER_SITE_OTHER): Payer: Medicare Other | Admitting: Internal Medicine

## 2017-04-11 VITALS — BP 120/78 | HR 63 | Temp 97.6°F | Ht 62.0 in | Wt 184.0 lb

## 2017-04-11 DIAGNOSIS — I10 Essential (primary) hypertension: Secondary | ICD-10-CM | POA: Diagnosis not present

## 2017-04-11 DIAGNOSIS — R739 Hyperglycemia, unspecified: Secondary | ICD-10-CM

## 2017-04-11 DIAGNOSIS — E6609 Other obesity due to excess calories: Secondary | ICD-10-CM

## 2017-04-11 DIAGNOSIS — Z6832 Body mass index (BMI) 32.0-32.9, adult: Secondary | ICD-10-CM

## 2017-04-11 DIAGNOSIS — E7800 Pure hypercholesterolemia, unspecified: Secondary | ICD-10-CM

## 2017-04-11 DIAGNOSIS — M1711 Unilateral primary osteoarthritis, right knee: Secondary | ICD-10-CM | POA: Diagnosis not present

## 2017-04-11 DIAGNOSIS — G4700 Insomnia, unspecified: Secondary | ICD-10-CM | POA: Diagnosis not present

## 2017-04-11 DIAGNOSIS — I480 Paroxysmal atrial fibrillation: Secondary | ICD-10-CM | POA: Diagnosis not present

## 2017-04-11 MED ORDER — ALPRAZOLAM 0.25 MG PO TABS: ORAL_TABLET | ORAL | 1 refills | Status: DC

## 2017-04-11 NOTE — Patient Instructions (Signed)
Podiatry;; Dr. Fritzi Mandes, [address], , [address], [place], [place]

## 2017-04-11 NOTE — Progress Notes (Signed)
Facility  [facility]    Place of Service:   OFFICE    Allergies  Allergen Reactions  . Advil [Ibuprofen]     GI upset  . Aleve [Naproxen Sodium]     GI upset  . Aspirin Nausea And Vomiting    Takes baby aspirin qod without problems  . Atorvastatin     Leg cramps  . Celebrex [Celecoxib]     GI upset  . Diclofenac     GI upset  . Doxycycline     Headache  . Metoclopramide Hcl     REACTION: heart palps  . Nsaids Other (See Comments)    Tears my stomach up   . Other     Antihistamine - increase BP  . Pravastatin     Leg cramps  . Timolol     Dropped HR    Chief Complaint  Patient presents with  . Medical Management of Chronic Issues    4 month medication management blood pressure, A-Fib, cholesterol, hyperglycemica, insomnia.     HPI:   Essential hypertension - controlled  Paroxysmal atrial fibrillation (HCC) - in NSR now. She has pacemaker and is on propafenone. This medication makes her nauseous. She plans to talk to her cardiologist to see if there is an alternative.  HYPERCHOLESTEROLEMIA - last lab I year ago. Needs follow up.  Hyperglycemia - needs follow up  Insomnia, unspecified type - continue Xanax prn  Class 1 obesity due to excess calories with serious comorbidity and body mass index (BMI) of 32.0 to 32.9 in adult - weight is unchanged. She is not following weight reduction plan.    Medications: Patient's Medications  New Prescriptions   No medications on file  Previous Medications   ACETAMINOPHEN (TYLENOL) 500 MG TABLET    Take 500 mg by mouth every 6 (six) hours as needed for mild pain.    ALPRAZOLAM (XANAX) 0.25 MG TABLET    Take one tablet by mouth at bedtime as needed for rest or anxiety   ANTISEPTIC ORAL RINSE (BIOTENE) LIQD    15 mLs by Mouth Rinse route daily as needed for dry mouth.   APIXABAN (ELIQUIS) 5 MG TABS TABLET    Take one tablet by mouth twice daily for anticoagulation   BIMATOPROST (LUMIGAN) 0.01 % SOLN    Instill 1 drop into  both eyes once daily in the evening   BIOTIN 5000 PO    Take 1 capsule by mouth daily.   CHOLECALCIFEROL (VITAMIN D) 2000 UNITS TABLET    Take 2,000 Units by mouth daily.   DORZOLAMIDE (TRUSOPT) 2 % OPHTHALMIC SOLUTION    Place 1 drop into both eyes 3 (three) times daily.    FUROSEMIDE (LASIX) 20 MG TABLET    Take 20 mg by mouth as needed for edema.    KETOPROFEN EX    Apply 1 application topically daily as needed (pain). 20% cream; PRN for pain.   METHYLCELLULOSE (ARTIFICIAL TEARS) 1 % OPHTHALMIC SOLUTION    Place 1 drop into both eyes daily as needed (for dry eyes).    METOPROLOL (LOPRESSOR) 50 MG TABLET    TAKE 1 TABLET BY MOUTH TWICE A DAY   PANTOPRAZOLE (PROTONIX) 40 MG TABLET    TAKE 1 TABLET BY MOUTH NIGHTLY TO REDUCE STOMACH ACID   PROPAFENONE (RYTHMOL SR) 225 MG 12 HR CAPSULE    Take 1 capsule (225 mg total) by mouth 2 (two) times daily.   TRAZODONE (DESYREL) 50 MG TABLET  One nightly to help sleep   VALSARTAN-HYDROCHLOROTHIAZIDE (DIOVAN-HCT) 320-25 MG TABLET    TAKE 1 TABLET BY MOUTH EVERY DAY  Modified Medications   No medications on file  Discontinued Medications   No medications on file    Review of Systems  Constitutional: Positive for activity change. Negative for appetite change, chills, diaphoresis, fatigue, fever and unexpected weight change.  HENT: Negative.   Eyes: Positive for discharge.       Corrective lenses.  Respiratory: Negative.   Cardiovascular: Positive for leg swelling. Negative for chest pain and palpitations.       Palpitations. Atrial fibrillation documented on EKG 03/29/16.  Gastrointestinal:       Frequent reflux and heartburn.  Endocrine:       History of elevations in glucose. Diet controlled.  Genitourinary: Positive for frequency.       Urinary leakage,  Musculoskeletal:       Chronic back pains. Right knee painful. Had surgery 07/04/13 by Dr. Ninfa Linden. Using tramadol. Having some left neck discomfort. Pain across the distal left metatarsals.    Skin: Negative.   Neurological: Positive for numbness. Negative for dizziness, tremors, seizures, syncope, facial asymmetry, speech difficulty, weakness, light-headedness and headaches.       Episodes of numbness in the right hand.  Hematological: Negative.   Psychiatric/Behavioral: Negative.        Some difficulty falling asleep. Using less Ambien now.    Vitals:   04/11/17 1558  BP: 120/78  Pulse: 63  Temp: 97.6 F (36.4 C)  TempSrc: Oral  SpO2: 98%  Weight: 184 lb (83.5 kg)  Height: 5\' 2"  (1.575 m)   Body mass index is 33.65 kg/m. Wt Readings from Last 3 Encounters:  04/11/17 184 lb (83.5 kg)  03/28/17 184 lb (83.5 kg)  12/22/16 186 lb (84.4 kg)      Physical Exam  Constitutional: She is oriented to person, place, and time. No distress.  Obese.  HENT:  Head: Normocephalic and atraumatic.  Right Ear: External ear normal.  Left Ear: External ear normal.  Eyes: Conjunctivae and EOM are normal. Pupils are equal, round, and reactive to light. Left eye exhibits no discharge. No scleral icterus.  Neck: Normal range of motion. Neck supple.  Cardiovascular: Normal rate, regular rhythm, normal heart sounds and intact distal pulses.   Pulmonary/Chest: Effort normal and breath sounds normal.  Pacemaker in left upper chest wall.  Abdominal: Soft. Bowel sounds are normal. She exhibits no distension. There is no tenderness.  Musculoskeletal: She exhibits no edema.  Right knee S/P TKR. Tenderness at the left metatarsal heads.  Neurological: She is alert and oriented to person, place, and time.  12/12/16 MMSE 30/30. Passed clock drawing.  Psychiatric: She has a normal mood and affect. Her behavior is normal. Judgment and thought content normal.    Labs reviewed: No flowsheet data found. Lab Results  Component Value Date   TSH 3.870 07/26/2015   Lab Results  Component Value Date   BUN 21 06/15/2016   BUN 19 04/26/2016   BUN 20 04/25/2016   Lab Results  Component Value  Date   HGBA1C 5.6 03/27/2016   HGBA1C 5.8 (H) 11/26/2015   HGBA1C 5.7 (H) 07/26/2015    Assessment/Plan  1. Essential hypertension The current medical regimen is effective;  continue present plan and medications. - Comprehensive metabolic panel; Future  2. Paroxysmal atrial fibrillation (HCC) Continue propafenone  3. HYPERCHOLESTEROLEMIA - Lipid panel; Future  4. Hyperglycemia - Comprehensive metabolic panel; Future -  Microalbumin, urine; Future  5. Insomnia, unspecified type -Xanax prn - ALPRAZolam (XANAX) 0.25 MG tablet; Take one tablet by mouth at bedtime as needed for rest or anxiety  Dispense: 30 tablet; Refill: 1  6. Class 1 obesity due to excess calories with serious comorbidity and body mass index (BMI) of 32.0 to 32.9 in adult Encouraged caloric restriction.  7. Arthritis of knee, right Aspercreme with Lidocaine seems to help about as much as the ketoprofen she says. The cost is much less.

## 2017-04-19 DIAGNOSIS — H01004 Unspecified blepharitis left upper eyelid: Secondary | ICD-10-CM | POA: Diagnosis not present

## 2017-04-19 DIAGNOSIS — H01001 Unspecified blepharitis right upper eyelid: Secondary | ICD-10-CM | POA: Diagnosis not present

## 2017-04-19 DIAGNOSIS — H01002 Unspecified blepharitis right lower eyelid: Secondary | ICD-10-CM | POA: Diagnosis not present

## 2017-04-19 DIAGNOSIS — H01005 Unspecified blepharitis left lower eyelid: Secondary | ICD-10-CM | POA: Diagnosis not present

## 2017-04-24 ENCOUNTER — Other Ambulatory Visit: Payer: Self-pay | Admitting: Internal Medicine

## 2017-05-09 ENCOUNTER — Other Ambulatory Visit: Payer: Self-pay | Admitting: Cardiology

## 2017-05-09 NOTE — Telephone Encounter (Signed)
Rx has been sent to the pharmacy electronically. ° °

## 2017-05-16 ENCOUNTER — Ambulatory Visit (INDEPENDENT_AMBULATORY_CARE_PROVIDER_SITE_OTHER): Payer: Medicare Other | Admitting: Physician Assistant

## 2017-05-16 ENCOUNTER — Encounter (INDEPENDENT_AMBULATORY_CARE_PROVIDER_SITE_OTHER): Payer: Self-pay | Admitting: Physician Assistant

## 2017-05-16 VITALS — Ht 62.0 in | Wt 184.0 lb

## 2017-05-16 DIAGNOSIS — M5440 Lumbago with sciatica, unspecified side: Secondary | ICD-10-CM | POA: Diagnosis not present

## 2017-05-16 DIAGNOSIS — G8929 Other chronic pain: Secondary | ICD-10-CM

## 2017-05-16 DIAGNOSIS — M7061 Trochanteric bursitis, right hip: Secondary | ICD-10-CM | POA: Diagnosis not present

## 2017-05-16 NOTE — Progress Notes (Signed)
Mrs. Harrell returns today follow-up of her right hip and right leg radicular pain. She states she started to Dr. Martinique she is cleared to have a hip injection for right trochanteric bursitis. However she did like to wait until around June 10 or 11th to have this prior to going on a trip to [place]. In regards to the epidural steroid injections Dr. Martinique did not feel that she can come off Eliquis long enough for this. She continues to have radicular symptoms down the right leg in an L4 like distribution. She is had good results from transforaminal injections by Dr. Ernestina Patches in the past.   Plan :We'll see her back in July and do a trochanteric injection of her right hip at that time. Did speak with her about possible transforaminal injection while on the Eliquis she did like to think about this and also talk to Dr. Martinique. Discussed with her about a consultation with Dr. Ernestina Patches prior to having the transforaminal injections. I personally spoke with Dr. Ernestina Patches has became a standard of practice for folks to have these transforaminal injections while on anticoagulants. Questions were encouraged and answered both of the patient and her  Husband who was present throughout examination today.

## 2017-06-20 NOTE — Addendum Note (Signed)
Addended by: Royann Shivers A on: 06/20/2017 12:37 PM   Modules accepted: Orders

## 2017-06-24 ENCOUNTER — Emergency Department (HOSPITAL_BASED_OUTPATIENT_CLINIC_OR_DEPARTMENT_OTHER): Payer: Medicare Other

## 2017-06-24 ENCOUNTER — Encounter (HOSPITAL_BASED_OUTPATIENT_CLINIC_OR_DEPARTMENT_OTHER): Payer: Self-pay | Admitting: *Deleted

## 2017-06-24 ENCOUNTER — Emergency Department (HOSPITAL_BASED_OUTPATIENT_CLINIC_OR_DEPARTMENT_OTHER)
Admission: EM | Admit: 2017-06-24 | Discharge: 2017-06-25 | Disposition: A | Discharge status: Home / Self Care | Payer: Medicare Other | Attending: Emergency Medicine | Admitting: Emergency Medicine

## 2017-06-24 DIAGNOSIS — S51811A Laceration without foreign body of right forearm, initial encounter: Secondary | ICD-10-CM | POA: Diagnosis not present

## 2017-06-24 DIAGNOSIS — Z96653 Presence of artificial knee joint, bilateral: Secondary | ICD-10-CM | POA: Diagnosis not present

## 2017-06-24 DIAGNOSIS — Z950 Presence of cardiac pacemaker: Secondary | ICD-10-CM | POA: Insufficient documentation

## 2017-06-24 DIAGNOSIS — W19XXXA Unspecified fall, initial encounter: Secondary | ICD-10-CM

## 2017-06-24 DIAGNOSIS — Z7901 Long term (current) use of anticoagulants: Secondary | ICD-10-CM | POA: Insufficient documentation

## 2017-06-24 DIAGNOSIS — R079 Chest pain, unspecified: Secondary | ICD-10-CM | POA: Diagnosis not present

## 2017-06-24 DIAGNOSIS — Y999 Unspecified external cause status: Secondary | ICD-10-CM | POA: Diagnosis not present

## 2017-06-24 DIAGNOSIS — Z79899 Other long term (current) drug therapy: Secondary | ICD-10-CM | POA: Diagnosis not present

## 2017-06-24 DIAGNOSIS — I10 Essential (primary) hypertension: Secondary | ICD-10-CM | POA: Diagnosis not present

## 2017-06-24 DIAGNOSIS — Y929 Unspecified place or not applicable: Secondary | ICD-10-CM | POA: Insufficient documentation

## 2017-06-24 DIAGNOSIS — Z23 Encounter for immunization: Secondary | ICD-10-CM | POA: Diagnosis not present

## 2017-06-24 DIAGNOSIS — Y939 Activity, unspecified: Secondary | ICD-10-CM | POA: Insufficient documentation

## 2017-06-24 DIAGNOSIS — W0110XA Fall on same level from slipping, tripping and stumbling with subsequent striking against unspecified object, initial encounter: Secondary | ICD-10-CM | POA: Diagnosis not present

## 2017-06-24 DIAGNOSIS — S59911A Unspecified injury of right forearm, initial encounter: Secondary | ICD-10-CM | POA: Diagnosis present

## 2017-06-24 MED ORDER — TETANUS-DIPHTH-ACELL PERTUSSIS 5-2.5-18.5 LF-MCG/0.5 IM SUSP
0.5000 mL | Freq: Once | INTRAMUSCULAR | Status: AC
  Administered 2017-06-24: 0.5 mL via INTRAMUSCULAR
  Filled 2017-06-24: qty 0.5

## 2017-06-24 NOTE — ED Provider Notes (Signed)
[facility] Provider Note   CSN: [id number] Arrival date & time: 06/24/17  2113  By signing my name below, I, Margit Banda, attest that this documentation has been prepared under the direction and in the presence of Gareth Morgan, MD. Electronically Signed: Margit Banda, ED Scribe. 06/24/17. 10:34 PM.  History   Chief Complaint Chief Complaint  Patient presents with  . Fall    HPI JALEIGHA DEANE is a 83 y.o. female who presents to the Emergency Department complaining of wounds to her right forearm s/p falling into the shrubbery and then rolling onto the concrete ~ 8:30-8:45 pm today (06/24/17). She also c/o of right sided rib pain. Pt reports she was watering her flowers when she fell. She states she lost her balance. Pt is on Eliquis. Her tetanus is NOT UTD. Pt denies LOC, head injury, HA, dysuria, hematuria, SOB, CP, nausea, and vomiting.   The history is provided by the patient. No language interpreter was used.    Past Medical History:  Diagnosis Date  . Allergic rhinitis due to pollen   . Anxiety state, unspecified   . Arthritis    "right knee" (06/21/2016)  . Asymptomatic varicose veins   . Carpal tunnel syndrome   . Chest pain, unspecified   . Chronic lower back pain    "on the left side" (06/21/2016)  . Cramp of limb   . Depressive disorder, not elsewhere classified   . Diaphragmatic hernia without mention of obstruction or gangrene   . Diaphragmatic hernia without mention of obstruction or gangrene   . Disturbance of skin sensation   . Diverticulosis of colon (without mention of hemorrhage)   . Enthesopathy of hip region   . Esophageal reflux   . Hepatitis, unspecified   . History of blood transfusion    "w/both knee replacements"  . History of duodenal ulcer   . Insomnia, unspecified   . Lumbago   . Migraine    "none since ~ 1990" (06/21/2016)  . Myalgia and myositis, unspecified   . Obesity, unspecified   . Other abnormal blood chemistry     . Other dysphagia   . Other malaise and fatigue   . Other nonspecific abnormal serum enzyme levels   . Other specified cardiac dysrhythmias(427.89)   . Pacemaker   . Pain in joint, ankle and foot   . Pain in joint, hand   . Pain in joint, lower leg   . Pain in joint, pelvic region and thigh   . Pain in limb   . Plantar fascial fibromatosis   . Presence of permanent cardiac pacemaker   . Reflux esophagitis   . Sciatica   . Spinal stenosis, unspecified region other than cervical   . Stricture and stenosis of esophagus   . Symptomatic menopausal or female climacteric states   . Unspecified essential hypertension   . Unspecified essential hypertension   . Unspecified vitamin D deficiency     Patient Active Problem List   Diagnosis Date Noted  . Trochanteric bursitis, right hip 05/16/2017  . Cardiac pacemaker in situ 06/21/2016  . Atypical atrial flutter ([place])   . Chest pain 04/10/2016  . Atrial fibrillation ([place]) 03/29/2016  . Hyperglycemia 02/17/2015  . Obese 02/17/2015  . Insomnia 01/27/2014  . 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 Knee joint replacement by other means 10/01/2013  . Arthritis of knee, right 07/04/2013  . Osteoarthritis of right knee 07/01/2013  . Cervicalgia 07/01/2013  . Bradycardia 05/13/2013  . HYPERCHOLESTEROLEMIA  06/02/2009  . ANXIETY 06/02/2009  . Essential hypertension 06/02/2009  . ESOPHAGEAL STRICTURE 06/02/2009  . GERD 06/02/2009  . HIATAL HERNIA 06/02/2009  . DIVERTICULOSIS, COLON 06/02/2009  . Dysphagia, pharyngoesophageal phase 06/02/2009  . Backache 05/01/2007  . LEG PAIN, RIGHT 05/01/2007    Past Surgical History:  Procedure Laterality Date  . BREAST BIOPSY Left 1990s X 2  . CARDIOVERSION N/A 04/26/2016   Procedure: CARDIOVERSION;  Surgeon: Pixie Casino, MD;  Location: [facility];  Service: Cardiovascular;  Laterality: N/A;  . CATARACT EXTRACTION W/ INTRAOCULAR LENS IMPLANT Left 08/03/1999   DR EPES   . CATARACT EXTRACTION  W/ INTRAOCULAR LENS IMPLANT Right 2000   DR EPES  . [place]  . COLONOSCOPY  1988   Normal   . DENTAL SURGERY Left 08/2016  . EP IMPLANTABLE DEVICE N/A 06/21/2016   Procedure: Pacemaker Implant;  Surgeon: Evans Lance, MD;  Location: [facility] [employer] [facility];  Service: Cardiovascular;  Laterality: N/A;  . ESOPHAGOGASTRODUODENOSCOPY (EGD) WITH ESOPHAGEAL DILATION  ~ 1982   Dr. Sharlett Iles  . EXCISION OF ACTINIC KERATOSIS     DR LUPTON   . EYE SURGERY    . INSERT / REPLACE / REMOVE PACEMAKER    . JOINT REPLACEMENT    . KNEE ARTHROSCOPY Left 2003  . KNEE ARTHROSCOPY Right 06-26-13  . KNEE CLOSED REDUCTION Right 10/23/2013   Procedure: CLOSED MANIPULATION RIGHT KNEE;  Surgeon: Mcarthur Rossetti, MD;  Location: [facility];  Service: Orthopedics;  Laterality: Right;  . LASER FOR CLOUDY CAP LEFT EYE Left 03/2006   DR DIGBY  . TOTAL KNEE ARTHROPLASTY Left 04/2004   DR RENDALL  . TOTAL KNEE ARTHROPLASTY Right 07/04/2013   Procedure: RIGHT TOTAL KNEE ARTHROPLASTY;  Surgeon: Mcarthur Rossetti, MD;  Location: [facility];  Service: Orthopedics;  Laterality: Right;  Marland Kitchen VAGINAL HYSTERECTOMY  1979    OB History    No data available       Home Medications    Prior to Admission medications   Medication Sig Start Date End Date Taking? Authorizing Provider  acetaminophen (TYLENOL) 500 MG tablet Take 500 mg by mouth every 6 (six) hours as needed for mild pain.     [provider]  ALPRAZolam Duanne Moron) 0.25 MG tablet Take one tablet by mouth at bedtime as needed for rest or anxiety 04/11/17   Estill Dooms, MD  antiseptic oral rinse (BIOTENE) LIQD 15 mLs by Mouth Rinse route daily as needed for dry mouth.    [provider]  apixaban (ELIQUIS) 5 MG TABS tablet Take one tablet by mouth twice daily for anticoagulation 02/05/17   Estill Dooms, MD  bimatoprost (LUMIGAN) 0.01 % SOLN Instill 1 drop into both eyes once daily in the evening    [provider]  BIOTIN 5000 PO Take 1 capsule by mouth daily.    [provider]  Cholecalciferol (VITAMIN D) 2000 UNITS tablet Take 2,000 Units by mouth daily.    [provider]  dorzolamide (TRUSOPT) 2 % ophthalmic solution Place 1 drop into both eyes 3 (three) times daily.     [provider]  furosemide (LASIX) 20 MG tablet Take 20 mg by mouth as needed for edema.  05/30/16   [provider]  KETOPROFEN EX Apply 1 application topically daily as needed (pain). 20% cream; PRN for pain.    [provider]  methylcellulose (ARTIFICIAL TEARS) 1 % ophthalmic solution Place 1 drop  into both eyes daily as needed (for dry eyes).     [provider]  metoprolol tartrate (LOPRESSOR) 50 MG tablet TAKE 1 TABLET BY MOUTH TWICE A DAY 05/09/17   Martinique, Peter M, MD  pantoprazole (PROTONIX) 40 MG tablet TAKE 1 TABLET BY MOUTH NIGHTLY TO REDUCE STOMACH ACID 03/12/17   Estill Dooms, MD  propafenone (RYTHMOL SR) 225 MG 12 hr capsule Take 1 capsule (225 mg total) by mouth 2 (two) times daily. 09/20/16   Evans Lance, MD  traZODone (DESYREL) 50 MG tablet One nightly to help sleep 12/12/16   Estill Dooms, MD  valsartan-hydrochlorothiazide (DIOVAN-HCT) 320-25 MG tablet TAKE 1 TABLET BY MOUTH EVERY DAY 04/24/17   Estill Dooms, MD    Family History Family History  Problem Relation Age of Onset  . Ovarian cancer Mother   . Uterine cancer Mother   . Cerebrovascular Accident Mother   . Heart disease Father   . Hypertension Brother   . Obesity Daughter     Social History Social History  Substance Use Topics  . Smoking status: Never Smoker  . Smokeless tobacco: Never Used  . Alcohol use No     Allergies   Advil [ibuprofen]; Aleve [naproxen sodium]; Aspirin; Atorvastatin; Celebrex [celecoxib]; Diclofenac; Doxycycline; Metoclopramide hcl; Nsaids; Other; Pravastatin; and Timolol   Review of Systems Review of Systems  Constitutional: Negative for fever.    HENT: Negative for sore throat.   Eyes: Negative for visual disturbance.  Respiratory: Negative for cough and shortness of breath.   Cardiovascular: Positive for chest pain (right chest wall pain under right arm).  Gastrointestinal: Negative for abdominal pain, nausea and vomiting.  Genitourinary: Negative for difficulty urinating, dysuria and hematuria.  Musculoskeletal: Negative for back pain and neck pain.  Skin: Positive for wound. Negative for rash.  Neurological: Negative for syncope and headaches.     Physical Exam Updated Vital Signs BP (!) 159/90 (BP Location: Left Arm)   Pulse 62   Temp 97.9 F (36.6 C) (Oral)   Resp 20   Ht 5\' 3"  (1.6 m)   Wt 81.6 kg (180 lb)   SpO2 96%   BMI 31.89 kg/m   Physical Exam  Constitutional: She is oriented to person, place, and time. She appears well-developed and well-nourished. No distress.  HENT:  Head: Normocephalic and atraumatic.  Eyes: Conjunctivae and EOM are normal.  Neck: Normal range of motion.  Cardiovascular: Normal rate, regular rhythm, normal heart sounds and intact distal pulses.  Exam reveals no gallop and no friction rub.   No murmur heard. Pulmonary/Chest: Effort normal and breath sounds normal. No respiratory distress. She has no wheezes. She has no rales. She exhibits tenderness (right).  Abdominal: Soft. She exhibits no distension. There is no tenderness. There is no guarding.  Musculoskeletal: She exhibits no edema or tenderness.  Neurological: She is alert and oriented to person, place, and time.  Skin: Skin is warm and dry. No rash noted. She is not diaphoretic. No erythema.  Skin tears to her right arm.  Nursing note and vitals reviewed.    ED Treatments / Results  DIAGNOSTIC STUDIES: Oxygen Saturation is 98% on RA, normal by my interpretation.   COORDINATION OF CARE: 10:34 PM-Discussed next steps with pt which includes a CXR and monitoring her sx. She is return for reevaluation if her sx worsen or  change. Pt verbalized understanding and is agreeable with the plan.   Labs (all labs ordered are listed, but only abnormal  results are displayed) Labs Reviewed - No data to display  EKG  EKG Interpretation None       Radiology Dg Chest 2 View  Result Date: 06/24/2017 CLINICAL DATA:  Chest pain after fall. EXAM: CHEST  2 VIEW COMPARISON:  June 22, 2016 FINDINGS: Stable 2 lead pacemaker. The heart size is borderline. The hila and mediastinum are normal. No pulmonary nodules or masses. Minimal atelectasis in the right base. No suspicious infiltrates. No other acute abnormalities. IMPRESSION: No acute abnormalities. Electronically Signed   By: Dorise Bullion III M.D   On: 06/24/2017 23:35    Procedures Procedures (including critical care time)  Medications Ordered in ED Medications  Tdap (BOOSTRIX) injection 0.5 mL (0.5 mLs Intramuscular Given 06/24/17 2257)     Initial Impression / Assessment and Plan / ED Course  I have reviewed the triage vital signs and the nursing notes.  Pertinent labs & imaging results that were available during my care of the patient were reviewed by me and considered in my medical decision making (see chart for details).     83yo female with hx above on eliquis presents with concern for fall while gardening. No syncope, reports standing up too quickly and losing balance. Reports mild chest wall pain on right after fall. XR negative, overall suspect more likely contusion than occult fx.  Discussed possibility of imaging of head given patient on eliquis, however she denies any head trauma, headache, AMS, nausea/vomiting and by mechanism have low suspicion for head trauma or bleed. Discussed we can obtain imaging given pt on eliquis, however it is reasonable given low suspicion for bleed to continue to monitor her symptoms and return for any worsening. Pt denies other injuries. Has small skin injuries including 2 skin tears to right arm.  Wound cleaned and dressed.  Tetanus vaccine updated. Patient discharged in stable condition with understanding of reasons to return.    I personally performed the services described in this documentation, which was scribed in my presence. The recorded information has been reviewed and is accurate.    Final Clinical Impressions(s) / ED Diagnoses   Final diagnoses:  Fall, initial encounter  Skin tear of right forearm without complication, initial encounter    New Prescriptions Discharge Medication List as of 06/24/2017 11:38 PM       Gareth Morgan, MD 06/25/17 1132

## 2017-06-24 NOTE — ED Triage Notes (Signed)
PT says she was watering her flower and she fell down into the shrubbery and concrete.  Pt has skin tears to the right forearm. She is on Eliquis. Denies hitting her head, no LOC.

## 2017-06-25 ENCOUNTER — Telehealth: Payer: Self-pay

## 2017-06-25 ENCOUNTER — Ambulatory Visit (INDEPENDENT_AMBULATORY_CARE_PROVIDER_SITE_OTHER): Payer: Medicare Other | Admitting: *Deleted

## 2017-06-25 DIAGNOSIS — I480 Paroxysmal atrial fibrillation: Secondary | ICD-10-CM

## 2017-06-25 NOTE — ED Notes (Signed)
Pt and SO given d/c instructions as per chart. Verbalize understanding. No questions. 

## 2017-06-25 NOTE — Progress Notes (Signed)
Remote pacemaker transmission.   

## 2017-06-25 NOTE — Telephone Encounter (Signed)
I called patient to reschedule hospital follow up appointment on 06/26/17 from 2:15 to 1:30 at providers request. Patient agreed to the changed.

## 2017-06-26 ENCOUNTER — Ambulatory Visit: Payer: Medicare Other | Admitting: Nurse Practitioner

## 2017-06-26 ENCOUNTER — Encounter: Payer: Self-pay | Admitting: Nurse Practitioner

## 2017-06-26 ENCOUNTER — Encounter: Payer: Self-pay | Admitting: Cardiology

## 2017-06-26 ENCOUNTER — Ambulatory Visit (INDEPENDENT_AMBULATORY_CARE_PROVIDER_SITE_OTHER): Payer: Medicare Other | Admitting: Nurse Practitioner

## 2017-06-26 VITALS — BP 124/68 | HR 64 | Temp 98.2°F | Resp 17 | Ht 62.0 in | Wt 186.2 lb

## 2017-06-26 DIAGNOSIS — S279XXD Injury of unspecified intrathoracic organ, subsequent encounter: Secondary | ICD-10-CM

## 2017-06-26 DIAGNOSIS — S51811D Laceration without foreign body of right forearm, subsequent encounter: Secondary | ICD-10-CM

## 2017-06-26 LAB — CUP PACEART REMOTE DEVICE CHECK
Battery Remaining Percentage: 95.5 %
Battery Voltage: 2.99 V
Brady Statistic AS VS Percent: 37 %
Brady Statistic RA Percent Paced: 57 %
Implantable Lead Implant Date: 20170628
Implantable Lead Implant Date: 20170628
Implantable Lead Location: 753859
Implantable Pulse Generator Implant Date: 20170628
Lead Channel Impedance Value: 480 Ohm
Lead Channel Impedance Value: 490 Ohm
Lead Channel Pacing Threshold Pulse Width: 0.4 ms
Lead Channel Sensing Intrinsic Amplitude: 2.3 mV
Lead Channel Sensing Intrinsic Amplitude: 4.8 mV
Lead Channel Setting Pacing Amplitude: 3.5 V
Lead Channel Setting Pacing Pulse Width: 0.4 ms
Lead Channel Setting Sensing Sensitivity: 2 mV
MDC IDC LEAD LOCATION: 753860
MDC IDC MSMT BATTERY REMAINING LONGEVITY: 91 mo
MDC IDC MSMT LEADCHNL RV PACING THRESHOLD AMPLITUDE: 0.75 V
MDC IDC PG SERIAL: 7910313
MDC IDC SESS DTM: 20180702125503
MDC IDC SET LEADCHNL RV PACING AMPLITUDE: 2.5 V
MDC IDC STAT BRADY AP VP PERCENT: 1 %
MDC IDC STAT BRADY AP VS PERCENT: 57 %
MDC IDC STAT BRADY AS VP PERCENT: 4.9 %
MDC IDC STAT BRADY RV PERCENT PACED: 5.3 %

## 2017-06-26 NOTE — Patient Instructions (Addendum)
Follow up in 1 week, sooner if needed 

## 2017-06-26 NOTE — Progress Notes (Signed)
Careteam: Patient Care Team: Lauree Chandler, NP as PCP - General (Geriatric Medicine) Mcarthur Rossetti, MD as Attending Physician (Orthopedic Surgery) Larey Dresser, MD as Attending Physician (Cardiology) Thornell Sartorius, MD as Consulting Physician (Otolaryngology) Druscilla Brownie, MD as Consulting Physician (Dermatology) Calvert Cantor, MD as Consulting Physician (Ophthalmology) Debara Pickett Nadean Corwin, MD as Consulting Physician (Cardiology)  Advanced Directive information Does Patient Have a Medical Advance Directive?: Yes, Type of Advance Directive: [place];Living will  Allergies  Allergen Reactions  . Advil [Ibuprofen]     GI upset  . Aleve [Naproxen Sodium]     GI upset  . Aspirin Nausea And Vomiting    Takes baby aspirin qod without problems  . Atorvastatin     Leg cramps  . Celebrex [Celecoxib]     GI upset  . Diclofenac     GI upset  . Doxycycline     Headache  . Metoclopramide Hcl     REACTION: heart palps  . Nsaids Other (See Comments)    Tears my stomach up   . Other     Antihistamine - increase BP  . Pravastatin     Leg cramps  . Timolol     Dropped HR    Chief Complaint  Patient presents with  . Follow-up    Pt is being seen for an ED follow up after being seen at [facility] for a fall on 06/24/17. Pt has several skin tears on right forearm and pain in right rib area when taking deep breath, coughing, and with movement.   . Other    Husband in room     HPI: Patient is a 83 y.o. female seen in the office today to follow up from her ED visit.  She fell on 06/24/17 while gardening and went to the ED due to contusions.  She has multiple skin tears which were cleaned and dressed. Got tetanus vaccine as well. Chest xray obtained and was negative but reports the most painful thing is her right chest, hurts to take a deep breath and move. Very uncomfortable in bed. Does not like taking medication. Will take tylenol  with good effect.  Took dressing off right forearm skin tears. Reports dressing was covered in blood but not much bleeding since. Has been putting neosporin on tear. No increase in redness or drainage.   Review of Systems:  Review of Systems  Constitutional: Negative for chills and fever.  Respiratory: Negative for cough and shortness of breath.   Cardiovascular: Negative for chest pain and claudication.  Musculoskeletal:       Tenderness to lower right chest wall  Skin:       Multiple skin tears to right forearm  Neurological: Negative for dizziness and headaches.    Past Medical History:  Diagnosis Date  . Allergic rhinitis due to pollen   . Anxiety state, unspecified   . Arthritis    "right knee" (06/21/2016)  . Asymptomatic varicose veins   . Carpal tunnel syndrome   . Chest pain, unspecified   . Chronic lower back pain    "on the left side" (06/21/2016)  . Cramp of limb   . Depressive disorder, not elsewhere classified   . Diaphragmatic hernia without mention of obstruction or gangrene   . Diaphragmatic hernia without mention of obstruction or gangrene   . Disturbance of skin sensation   . Diverticulosis of colon (without mention of hemorrhage)   . Enthesopathy of hip region   .  Esophageal reflux   . Hepatitis, unspecified   . History of blood transfusion    "w/both knee replacements"  . History of duodenal ulcer   . Insomnia, unspecified   . Lumbago   . Migraine    "none since ~ 1990" (06/21/2016)  . Myalgia and myositis, unspecified   . Obesity, unspecified   . Other abnormal blood chemistry   . Other dysphagia   . Other malaise and fatigue   . Other nonspecific abnormal serum enzyme levels   . Other specified cardiac dysrhythmias(427.89)   . Pacemaker   . Pain in joint, ankle and foot   . Pain in joint, hand   . Pain in joint, lower leg   . Pain in joint, pelvic region and thigh   . Pain in limb   . Plantar fascial fibromatosis   . Presence of permanent  cardiac pacemaker   . Reflux esophagitis   . Sciatica   . Spinal stenosis, unspecified region other than cervical   . Stricture and stenosis of esophagus   . Symptomatic menopausal or female climacteric states   . Unspecified essential hypertension   . Unspecified essential hypertension   . Unspecified vitamin D deficiency    Past Surgical History:  Procedure Laterality Date  . BREAST BIOPSY Left 1990s X 2  . CARDIOVERSION N/A 04/26/2016   Procedure: CARDIOVERSION;  Surgeon: Pixie Casino, MD;  Location: [place];  Service: Cardiovascular;  Laterality: N/A;  . CATARACT EXTRACTION W/ INTRAOCULAR LENS IMPLANT Left 08/03/1999   DR EPES   . CATARACT EXTRACTION W/ INTRAOCULAR LENS IMPLANT Right 2000   DR EPES  . [place]  . COLONOSCOPY  1988   Normal   . DENTAL SURGERY Left 08/2016  . EP IMPLANTABLE DEVICE N/A 06/21/2016   Procedure: Pacemaker Implant;  Surgeon: Evans Lance, MD;  Location: [place] [facility];  Service: Cardiovascular;  Laterality: N/A;  . ESOPHAGOGASTRODUODENOSCOPY (EGD) WITH ESOPHAGEAL DILATION  ~ 1982   Dr. Sharlett Iles  . EXCISION OF ACTINIC KERATOSIS     DR LUPTON   . EYE SURGERY    . INSERT / REPLACE / REMOVE PACEMAKER    . JOINT REPLACEMENT    . KNEE ARTHROSCOPY Left 2003  . KNEE ARTHROSCOPY Right 06-26-13  . KNEE CLOSED REDUCTION Right 10/23/2013   Procedure: CLOSED MANIPULATION RIGHT KNEE;  Surgeon: Mcarthur Rossetti, MD;  Location: [place];  Service: Orthopedics;  Laterality: Right;  . LASER FOR CLOUDY CAP LEFT EYE Left 03/2006   DR DIGBY  . TOTAL KNEE ARTHROPLASTY Left 04/2004   DR RENDALL  . TOTAL KNEE ARTHROPLASTY Right 07/04/2013   Procedure: RIGHT TOTAL KNEE ARTHROPLASTY;  Surgeon: Mcarthur Rossetti, MD;  [facility];  Service: Orthopedics;  Laterality: Right;  Marland Kitchen VAGINAL HYSTERECTOMY  1979   Social History:   reports that she has never smoked. She has never used smokeless tobacco. She reports that she  does not drink alcohol or use drugs.  Family History  Problem Relation Age of Onset  . Ovarian cancer Mother   . Uterine cancer Mother   . Cerebrovascular Accident Mother   . Heart disease Father   . Hypertension Brother   . Obesity Daughter     Medications: Patient's Medications  New Prescriptions   No medications on file  Previous Medications   ACETAMINOPHEN (TYLENOL) 500 MG TABLET    Take 500 mg by mouth every 6 (six) hours as needed for mild  pain.    ALPRAZOLAM (XANAX) 0.25 MG TABLET    Take one tablet by mouth at bedtime as needed for rest or anxiety   ANTISEPTIC ORAL RINSE (BIOTENE) LIQD    15 mLs by Mouth Rinse route daily as needed for dry mouth.   APIXABAN (ELIQUIS) 5 MG TABS TABLET    Take one tablet by mouth twice daily for anticoagulation   BIMATOPROST (LUMIGAN) 0.01 % SOLN    Instill 1 drop into both eyes once daily in the evening   BIOTIN 5000 PO    Take 1 capsule by mouth daily.   CHOLECALCIFEROL (VITAMIN D) 2000 UNITS TABLET    Take 2,000 Units by mouth daily.   DORZOLAMIDE (TRUSOPT) 2 % OPHTHALMIC SOLUTION    Place 1 drop into both eyes 3 (three) times daily.    FUROSEMIDE (LASIX) 20 MG TABLET    Take 20 mg by mouth as needed for edema.    KETOPROFEN EX    Apply 1 application topically daily as needed (pain). 20% cream; PRN for pain.   METHYLCELLULOSE (ARTIFICIAL TEARS) 1 % OPHTHALMIC SOLUTION    Place 1 drop into both eyes daily as needed (for dry eyes).    METOPROLOL TARTRATE (LOPRESSOR) 50 MG TABLET    TAKE 1 TABLET BY MOUTH TWICE A DAY   PANTOPRAZOLE (PROTONIX) 40 MG TABLET    TAKE 1 TABLET BY MOUTH NIGHTLY TO REDUCE STOMACH ACID   PROPAFENONE (RYTHMOL SR) 225 MG 12 HR CAPSULE    Take 1 capsule (225 mg total) by mouth 2 (two) times daily.   TRAZODONE (DESYREL) 50 MG TABLET    One nightly to help sleep   VALSARTAN-HYDROCHLOROTHIAZIDE (DIOVAN-HCT) 320-25 MG TABLET    TAKE 1 TABLET BY MOUTH EVERY DAY  Modified Medications   No medications on file  Discontinued  Medications   No medications on file     Physical Exam:  Vitals:   06/26/17 1348  BP: 124/68  Pulse: 64  Resp: 17  Temp: 98.2 F (36.8 C)  TempSrc: Oral  SpO2: 97%  Weight: 186 lb 3.2 oz (84.5 kg)  Height: 5\' 2"  (1.575 m)   Body mass index is 34.06 kg/m.  Physical Exam  Constitutional: She is oriented to person, place, and time. No distress.  Obese.  HENT:  Head: Normocephalic and atraumatic.  Right Ear: External ear normal.  Left Ear: External ear normal.  Eyes: Conjunctivae and EOM are normal. Pupils are equal, round, and reactive to light. Left eye exhibits no discharge. No scleral icterus.  Neck: Normal range of motion. Neck supple.  Cardiovascular: Normal rate, regular rhythm, normal heart sounds and intact distal pulses.   Pulmonary/Chest: Effort normal and breath sounds normal.  Pacemaker in left upper chest wall.  Abdominal: Soft. Bowel sounds are normal. She exhibits no distension. There is no tenderness.  Musculoskeletal: She exhibits no edema.  Right knee S/P TKR. Tenderness at the left metatarsal heads.  Neurological: She is alert and oriented to person, place, and time.  12/12/16 MMSE 30/30. Passed clock drawing.  Skin:  2 skin tears noted to right forearm, partial flap intact. No drainage, heat or erythema noted.   Psychiatric: She has a normal mood and affect. Her behavior is normal. Judgment and thought content normal.    Labs reviewed: Basic Metabolic Panel: No results for input(s): NA, K, CL, CO2, GLUCOSE, BUN, CREATININE, CALCIUM, MG, PHOS, TSH in the last 8760 hours. Liver Function Tests: No results for input(s): AST, ALT, ALKPHOS, BILITOT,  PROT, ALBUMIN in the last 8760 hours. No results for input(s): LIPASE, AMYLASE in the last 8760 hours. No results for input(s): AMMONIA in the last 8760 hours. CBC: No results for input(s): WBC, NEUTROABS, HGB, HCT, MCV, PLT in the last 8760 hours. Lipid Panel: No results for input(s): CHOL, HDL, LDLCALC,  TRIG, CHOLHDL, LDLDIRECT in the last 8760 hours. TSH: No results for input(s): TSH in the last 8760 hours. A1C: Lab Results  Component Value Date   HGBA1C 5.6 03/27/2016     Assessment/Plan 1. Skin tear of right forearm without complication, subsequent encounter No signs of infection at this time; Tegaderm applied to skin tear and instructions given to leave on until follow up. Instructions given to not pull against the skin tear due to risk of removing skin and making area worse. To notify if redness or drainage occurs.   2. Contusion of intrathoracic structure, unspecified intrathoracic structure, subsequent encounter Chest xray negative. Encouraged to use tylenol 500 mg 1-2 tablets as needed every 8 hours to help with pain.   To follow up in 1 week. Sooner if needed   [employer]. Harle Battiest  [facility] [phone] 8 am - 5 pm) [phone] (after hours)

## 2017-06-28 ENCOUNTER — Ambulatory Visit (INDEPENDENT_AMBULATORY_CARE_PROVIDER_SITE_OTHER): Payer: Medicare Other | Admitting: Physician Assistant

## 2017-07-02 ENCOUNTER — Ambulatory Visit (INDEPENDENT_AMBULATORY_CARE_PROVIDER_SITE_OTHER): Payer: Medicare Other | Admitting: Nurse Practitioner

## 2017-07-02 ENCOUNTER — Encounter: Payer: Self-pay | Admitting: Nurse Practitioner

## 2017-07-02 ENCOUNTER — Telehealth: Payer: Self-pay

## 2017-07-02 VITALS — BP 118/76 | HR 64 | Temp 98.3°F | Ht 62.0 in | Wt 186.0 lb

## 2017-07-02 DIAGNOSIS — T148XXA Other injury of unspecified body region, initial encounter: Secondary | ICD-10-CM

## 2017-07-02 DIAGNOSIS — S279XXD Injury of unspecified intrathoracic organ, subsequent encounter: Secondary | ICD-10-CM | POA: Diagnosis not present

## 2017-07-02 DIAGNOSIS — L089 Local infection of the skin and subcutaneous tissue, unspecified: Secondary | ICD-10-CM | POA: Diagnosis not present

## 2017-07-02 MED ORDER — DOXYCYCLINE HYCLATE 100 MG PO TABS: 100.0000 mg | ORAL_TABLET | Freq: Two times a day (BID) | ORAL | 0 refills | Status: DC

## 2017-07-02 MED ORDER — ZOSTER VAC RECOMB ADJUVANTED 50 MCG/0.5ML IM SUSR: 0.5000 mL | Freq: Once | INTRAMUSCULAR | 0 refills | Status: AC

## 2017-07-02 NOTE — Telephone Encounter (Signed)
Patient had left message that her bandage on her arm came off. There was a blood blister on it. Has appt tomorrow with Janett Billow, not sure what to do. Spoke with Janett Billow, ask if there is any pus, arm feels warm, She could come in today at 1:00 and she could check it. Spoke with patient, there is a spot that could be pus, not sure. Coming in at 1:00 today. appt made.

## 2017-07-02 NOTE — Progress Notes (Signed)
Careteam: Patient Care Team: Lauree Chandler, NP as PCP - General (Geriatric Medicine) Mcarthur Rossetti, MD as Attending Physician (Orthopedic Surgery) Larey Dresser, MD as Attending Physician (Cardiology) Thornell Sartorius, MD as Consulting Physician (Otolaryngology) Druscilla Brownie, MD as Consulting Physician (Dermatology) Calvert Cantor, MD as Consulting Physician (Ophthalmology) Debara Pickett Nadean Corwin, MD as Consulting Physician (Cardiology)   Allergies  Allergen Reactions  . Advil [Ibuprofen]     GI upset  . Aleve [Naproxen Sodium]     GI upset  . Aspirin Nausea And Vomiting    Takes baby aspirin qod without problems  . Atorvastatin     Leg cramps  . Celebrex [Celecoxib]     GI upset  . Diclofenac     GI upset  . Doxycycline     Headache  . Metoclopramide Hcl     REACTION: heart palps  . Nsaids Other (See Comments)    Tears my stomach up   . Other     Antihistamine - increase BP  . Pravastatin     Leg cramps  . Timolol     Dropped HR    Chief Complaint  Patient presents with  . Acute Visit    Examine right arm, patient fell 1 week ago. This morning area was raised (blood blister), area since went down. Patient c/o arm soreness   . Immunizations    RX for Shingles Vaccine sent to pharmacy      HPI: Patient is a 83 y.o. female seen in the office today to follow up skin tear.  Pt noted a raised area to skin tear that busted and blood came out. Had follow up skin tear scheduled for tomorrow but requested visit sooner.  Area around skin tear has been swollen and pink Very tender. Slightly warm.  No purulent drainage.   Still having pain to right rib cage.  conts to take deep breath and get up to do ADLs. Worse in the evening, during the day this is getting much better. Taking tylenol 1000 mg as needed which is effective. Works for ~ 1 hour at night.   Review of Systems:  Review of Systems  Constitutional: Negative for chills and fever.    Respiratory: Negative for cough and shortness of breath.   Cardiovascular: Negative for chest pain and claudication.  Musculoskeletal:       Tenderness to lower right chest wall  Skin:       2 skin tears to right forearm Bloody drainage tender  Neurological: Negative for dizziness and headaches.    Past Medical History:  Diagnosis Date  . Allergic rhinitis due to pollen   . Anxiety state, unspecified   . Arthritis    "right knee" (06/21/2016)  . Asymptomatic varicose veins   . Carpal tunnel syndrome   . Chest pain, unspecified   . Chronic lower back pain    "on the left side" (06/21/2016)  . Cramp of limb   . Depressive disorder, not elsewhere classified   . Diaphragmatic hernia without mention of obstruction or gangrene   . Diaphragmatic hernia without mention of obstruction or gangrene   . Disturbance of skin sensation   . Diverticulosis of colon (without mention of hemorrhage)   . Enthesopathy of hip region   . Esophageal reflux   . Hepatitis, unspecified   . History of blood transfusion    "w/both knee replacements"  . History of duodenal ulcer   . Insomnia, unspecified   . Lumbago   .  Migraine    "none since ~ 1990" (06/21/2016)  . Myalgia and myositis, unspecified   . Obesity, unspecified   . Other abnormal blood chemistry   . Other dysphagia   . Other malaise and fatigue   . Other nonspecific abnormal serum enzyme levels   . Other specified cardiac dysrhythmias(427.89)   . Pacemaker   . Pain in joint, ankle and foot   . Pain in joint, hand   . Pain in joint, lower leg   . Pain in joint, pelvic region and thigh   . Pain in limb   . Plantar fascial fibromatosis   . Presence of permanent cardiac pacemaker   . Reflux esophagitis   . Sciatica   . Spinal stenosis, unspecified region other than cervical   . Stricture and stenosis of esophagus   . Symptomatic menopausal or female climacteric states   . Unspecified essential hypertension   . Unspecified  essential hypertension   . Unspecified vitamin D deficiency    Past Surgical History:  Procedure Laterality Date  . BREAST BIOPSY Left 1990s X 2  . CARDIOVERSION N/A 04/26/2016   Procedure: CARDIOVERSION;  Surgeon: Pixie Casino, MD;  Location: [facility];  Service: Cardiovascular;  Laterality: N/A;  . CATARACT EXTRACTION W/ INTRAOCULAR LENS IMPLANT Left 08/03/1999   DR EPES   . CATARACT EXTRACTION W/ INTRAOCULAR LENS IMPLANT Right 2000   DR EPES  . [place]  . COLONOSCOPY  1988   Normal   . DENTAL SURGERY Left 08/2016  . EP IMPLANTABLE DEVICE N/A 06/21/2016   Procedure: Pacemaker Implant;  Surgeon: Evans Lance, MD;  Location: [place] [facility];  Service: Cardiovascular;  Laterality: N/A;  . ESOPHAGOGASTRODUODENOSCOPY (EGD) WITH ESOPHAGEAL DILATION  ~ 1982   Dr. Sharlett Iles  . EXCISION OF ACTINIC KERATOSIS     DR LUPTON   . EYE SURGERY    . INSERT / REPLACE / REMOVE PACEMAKER    . JOINT REPLACEMENT    . KNEE ARTHROSCOPY Left 2003  . KNEE ARTHROSCOPY Right 06-26-13  . KNEE CLOSED REDUCTION Right 10/23/2013   Procedure: CLOSED MANIPULATION RIGHT KNEE;  Surgeon: Mcarthur Rossetti, MD;  Location: [facility];  Service: Orthopedics;  Laterality: Right;  . LASER FOR CLOUDY CAP LEFT EYE Left 03/2006   DR DIGBY  . TOTAL KNEE ARTHROPLASTY Left 04/2004   DR RENDALL  . TOTAL KNEE ARTHROPLASTY Right 07/04/2013   Procedure: RIGHT TOTAL KNEE ARTHROPLASTY;  Surgeon: Mcarthur Rossetti, MD;  Location: [facility];  Service: Orthopedics;  Laterality: Right;  Marland Kitchen VAGINAL HYSTERECTOMY  1979   Social History:   reports that she has never smoked. She has never used smokeless tobacco. She reports that she does not drink alcohol or use drugs.  Family History  Problem Relation Age of Onset  . Ovarian cancer Mother   . Uterine cancer Mother   . Cerebrovascular Accident Mother   . Heart disease Father   . Hypertension Brother   . Obesity Daughter      Medications: Patient's Medications  New Prescriptions   No medications on file  Previous Medications   ACETAMINOPHEN (TYLENOL) 500 MG TABLET    Take 500 mg by mouth every 6 (six) hours as needed for mild pain.    ALPRAZOLAM (XANAX) 0.25 MG TABLET    Take one tablet by mouth at bedtime as needed for rest or anxiety   ANTISEPTIC ORAL RINSE (BIOTENE) LIQD    15 mLs by  Mouth Rinse route daily as needed for dry mouth.   APIXABAN (ELIQUIS) 5 MG TABS TABLET    Take one tablet by mouth twice daily for anticoagulation   BIMATOPROST (LUMIGAN) 0.01 % SOLN    Instill 1 drop into both eyes once daily in the evening   BIOTIN 5000 PO    Take 1 capsule by mouth daily.   CHOLECALCIFEROL (VITAMIN D) 2000 UNITS TABLET    Take 2,000 Units by mouth daily.   DORZOLAMIDE (TRUSOPT) 2 % OPHTHALMIC SOLUTION    Place 1 drop into both eyes 3 (three) times daily.    FUROSEMIDE (LASIX) 20 MG TABLET    Take 20 mg by mouth as needed for edema.    KETOPROFEN EX    Apply 1 application topically daily as needed (pain). 20% cream; PRN for pain.   METHYLCELLULOSE (ARTIFICIAL TEARS) 1 % OPHTHALMIC SOLUTION    Place 1 drop into both eyes daily as needed (for dry eyes).    METOPROLOL TARTRATE (LOPRESSOR) 50 MG TABLET    TAKE 1 TABLET BY MOUTH TWICE A DAY   PANTOPRAZOLE (PROTONIX) 40 MG TABLET    TAKE 1 TABLET BY MOUTH NIGHTLY TO REDUCE STOMACH ACID   PROPAFENONE (RYTHMOL SR) 225 MG 12 HR CAPSULE    Take 1 capsule (225 mg total) by mouth 2 (two) times daily.   TRAZODONE (DESYREL) 50 MG TABLET    One nightly to help sleep   VALSARTAN-HYDROCHLOROTHIAZIDE (DIOVAN-HCT) 320-25 MG TABLET    TAKE 1 TABLET BY MOUTH EVERY DAY  Modified Medications   Modified Medication Previous Medication   ZOSTER VAC RECOMB ADJUVANTED (SHINGRIX) INJECTION Zoster Vac Recomb Adjuvanted (SHINGRIX) injection      Inject 0.5 mLs into the muscle once.    Inject 0.5 mLs into the muscle once.  Discontinued Medications   No medications on file      Physical Exam:  Vitals:   07/02/17 1304  BP: 118/76  Pulse: 64  Temp: 98.3 F (36.8 C)  TempSrc: Oral  SpO2: 98%  Weight: 186 lb (84.4 kg)  Height: 5\' 2"  (1.575 m)   Body mass index is 34.02 kg/m.  Physical Exam  Constitutional: She is oriented to person, place, and time. No distress.  Obese.  HENT:  Head: Normocephalic and atraumatic.  Right Ear: External ear normal.  Left Ear: External ear normal.  Eyes: Conjunctivae and EOM are normal. Pupils are equal, round, and reactive to light. Left eye exhibits no discharge. No scleral icterus.  Neck: Normal range of motion. Neck supple.  Cardiovascular: Normal rate, regular rhythm, normal heart sounds and intact distal pulses.   Pulmonary/Chest: Effort normal and breath sounds normal.  Pacemaker in left upper chest wall.  Abdominal: Soft. Bowel sounds are normal. She exhibits no distension. There is no tenderness.  Musculoskeletal: She exhibits no edema.  Right knee S/P TKR. Tenderness at the left metatarsal heads.  Neurological: She is alert and oriented to person, place, and time.  12/12/16 MMSE 30/30. Passed clock drawing.  Skin:  2 skin tears noted to right forearm, partial flap intact. Macerated edges, slight warmth and tenderness noted   Psychiatric: She has a normal mood and affect. Her behavior is normal. Judgment and thought content normal.    Labs reviewed: Basic Metabolic Panel: No results for input(s): NA, K, CL, CO2, GLUCOSE, BUN, CREATININE, CALCIUM, MG, PHOS, TSH in the last 8760 hours. Liver Function Tests: No results for input(s): AST, ALT, ALKPHOS, BILITOT, PROT, ALBUMIN in the last 8760  hours. No results for input(s): LIPASE, AMYLASE in the last 8760 hours. No results for input(s): AMMONIA in the last 8760 hours. CBC: No results for input(s): WBC, NEUTROABS, HGB, HCT, MCV, PLT in the last 8760 hours. Lipid Panel: No results for input(s): CHOL, HDL, LDLCALC, TRIG, CHOLHDL, LDLDIRECT in the last 8760  hours. TSH: No results for input(s): TSH in the last 8760 hours. A1C: Lab Results  Component Value Date   HGBA1C 5.6 03/27/2016     Assessment/Plan 1. Contusion of intrathoracic structure, unspecified intrathoracic structure, subsequent encounter conts to have pain at night, getting better during the day -to use heat TID with aspercream afterwards -can use tramadol at night to help with pain  2. Infected skin tear -edema and redness slightly improved but increase in tenderness -will treat with doxycycline BID for 7 days. Noted on allergy list but pt states she does NOT have allergy. Discussed that "headache" was the allergy but she reports she did not remember having intolerance to this medication in the past and would like to try medication again -to use neosporin to skin twice daily and cover with non-adhesive   To follow up in 1 week if skin tear has not improved or pain persist  Yaiden Yang K. Harle Battiest  [facility] [phone] 8 am - 5 pm) [phone] (after hours)

## 2017-07-02 NOTE — Patient Instructions (Signed)
Use non-adherent pads covered with curlex dressing Take doxycyline 100 mg by mouth twice daily for 1 week  Take probiotic (florastor) twice daily for 10 days   Can cancel tomorrows appt but call next week if you have ANY questions  Okay to use aspercream to side  To use tramadol at bedtime if needed To use heating pad 2-3 times daily as needed

## 2017-07-03 ENCOUNTER — Ambulatory Visit: Payer: Medicare Other | Admitting: Nurse Practitioner

## 2017-07-25 ENCOUNTER — Other Ambulatory Visit: Payer: Self-pay

## 2017-07-25 DIAGNOSIS — I10 Essential (primary) hypertension: Secondary | ICD-10-CM

## 2017-07-25 DIAGNOSIS — E7800 Pure hypercholesterolemia, unspecified: Secondary | ICD-10-CM

## 2017-07-25 DIAGNOSIS — R739 Hyperglycemia, unspecified: Secondary | ICD-10-CM

## 2017-07-26 DIAGNOSIS — L814 Other melanin hyperpigmentation: Secondary | ICD-10-CM | POA: Diagnosis not present

## 2017-07-26 DIAGNOSIS — L570 Actinic keratosis: Secondary | ICD-10-CM | POA: Diagnosis not present

## 2017-07-26 DIAGNOSIS — L988 Other specified disorders of the skin and subcutaneous tissue: Secondary | ICD-10-CM | POA: Diagnosis not present

## 2017-08-06 ENCOUNTER — Other Ambulatory Visit: Payer: Self-pay | Admitting: Nurse Practitioner

## 2017-08-06 ENCOUNTER — Other Ambulatory Visit: Payer: Medicare Other

## 2017-08-06 DIAGNOSIS — I10 Essential (primary) hypertension: Secondary | ICD-10-CM | POA: Diagnosis not present

## 2017-08-06 DIAGNOSIS — R739 Hyperglycemia, unspecified: Secondary | ICD-10-CM

## 2017-08-06 DIAGNOSIS — E7800 Pure hypercholesterolemia, unspecified: Secondary | ICD-10-CM

## 2017-08-06 LAB — COMPLETE METABOLIC PANEL WITH GFR
ALT: 14 U/L (ref 6–29)
AST: 16 U/L (ref 10–35)
Albumin: 3.6 g/dL (ref 3.6–5.1)
Alkaline Phosphatase: 55 U/L (ref 33–130)
BUN: 14 mg/dL (ref 7–25)
CHLORIDE: 105 mmol/L (ref 98–110)
CO2: 27 mmol/L (ref 20–32)
CREATININE: 0.74 mg/dL (ref 0.60–0.88)
Calcium: 8.7 mg/dL (ref 8.6–10.4)
GFR, EST AFRICAN AMERICAN: 86 mL/min (ref >=60)
GFR, Est Non African American: 75 mL/min (ref >=60)
GLUCOSE: 93 mg/dL (ref 65–99)
Potassium: 4.1 mmol/L (ref 3.5–5.3)
SODIUM: 136 mmol/L (ref 135–146)
Total Bilirubin: 0.5 mg/dL (ref 0.2–1.2)
Total Protein: 6.2 g/dL (ref 6.1–8.1)

## 2017-08-06 LAB — LIPID PANEL
Cholesterol: 197 mg/dL (ref <200)
HDL: 35 mg/dL — ABNORMAL LOW (ref >50)
LDL CALC: 120 mg/dL — AB (ref <100)
TRIGLYCERIDES: 208 mg/dL — AB (ref <150)
Total CHOL/HDL Ratio: 5.6 Ratio — ABNORMAL HIGH (ref <5.0)
VLDL: 42 mg/dL — AB (ref <30)

## 2017-08-07 LAB — HEMOGLOBIN A1C
HEMOGLOBIN A1C: 5.2 % (ref <5.7)
Mean Plasma Glucose: 103 mg/dL

## 2017-08-07 LAB — MICROALBUMIN, URINE: MICROALB UR: 0.8 mg/dL

## 2017-08-08 ENCOUNTER — Encounter: Payer: Self-pay | Admitting: Internal Medicine

## 2017-08-09 ENCOUNTER — Encounter: Payer: Self-pay | Admitting: Nurse Practitioner

## 2017-08-09 ENCOUNTER — Ambulatory Visit (INDEPENDENT_AMBULATORY_CARE_PROVIDER_SITE_OTHER): Payer: Medicare Other | Admitting: Nurse Practitioner

## 2017-08-09 VITALS — BP 120/84 | HR 63 | Temp 98.3°F | Resp 17 | Ht 62.0 in | Wt 184.6 lb

## 2017-08-09 DIAGNOSIS — I10 Essential (primary) hypertension: Secondary | ICD-10-CM

## 2017-08-09 DIAGNOSIS — K219 Gastro-esophageal reflux disease without esophagitis: Secondary | ICD-10-CM | POA: Diagnosis not present

## 2017-08-09 DIAGNOSIS — L089 Local infection of the skin and subcutaneous tissue, unspecified: Secondary | ICD-10-CM | POA: Diagnosis not present

## 2017-08-09 DIAGNOSIS — Z6832 Body mass index (BMI) 32.0-32.9, adult: Secondary | ICD-10-CM

## 2017-08-09 DIAGNOSIS — R5383 Other fatigue: Secondary | ICD-10-CM | POA: Diagnosis not present

## 2017-08-09 DIAGNOSIS — I480 Paroxysmal atrial fibrillation: Secondary | ICD-10-CM | POA: Diagnosis not present

## 2017-08-09 DIAGNOSIS — E6609 Other obesity due to excess calories: Secondary | ICD-10-CM | POA: Diagnosis not present

## 2017-08-09 DIAGNOSIS — T148XXA Other injury of unspecified body region, initial encounter: Secondary | ICD-10-CM | POA: Diagnosis not present

## 2017-08-09 MED ORDER — ESOMEPRAZOLE MAGNESIUM 20 MG PO CPDR: 20.0000 mg | DELAYED_RELEASE_CAPSULE | Freq: Every day | ORAL | 2 refills | Status: DC

## 2017-08-09 MED ORDER — ZOSTER VAC RECOMB ADJUVANTED 50 MCG/0.5ML IM SUSR: 0.5000 mL | Freq: Once | INTRAMUSCULAR | 1 refills | Status: AC

## 2017-08-09 MED ORDER — ESOMEPRAZOLE MAGNESIUM 20 MG PO CPDR: 40.0000 mg | DELAYED_RELEASE_CAPSULE | Freq: Every day | ORAL | 2 refills | Status: DC

## 2017-08-09 NOTE — Progress Notes (Signed)
Careteam: Patient Care Team: Lauree Chandler, NP as PCP - General (Geriatric Medicine) Mcarthur Rossetti, MD as Attending Physician (Orthopedic Surgery) Larey Dresser, MD as Attending Physician (Cardiology) Thornell Sartorius, MD as Consulting Physician (Otolaryngology) Druscilla Brownie, MD as Consulting Physician (Dermatology) Calvert Cantor, MD as Consulting Physician (Ophthalmology) Debara Pickett Nadean Corwin, MD as Consulting Physician (Cardiology)  Advanced Directive information Does Patient Have a Medical Advance Directive?: Yes, Type of Advance Directive: Healthcare Power of Attorney  Allergies  Allergen Reactions  . Advil [Ibuprofen]     GI upset  . Aleve [Naproxen Sodium]     GI upset  . Aspirin Nausea And Vomiting    Takes baby aspirin qod without problems  . Atorvastatin     Leg cramps  . Celebrex [Celecoxib]     GI upset  . Diclofenac     GI upset  . Doxycycline     Headache  . Metoclopramide Hcl     REACTION: heart palps  . Nsaids Other (See Comments)    Tears my stomach up   . Other     Antihistamine - increase BP  . Pravastatin     Leg cramps  . Timolol     Dropped HR    Chief Complaint  Patient presents with  . Medical Management of Chronic Issues    Pt is being seen for a 4 month routine visit. Pt would like to review lab results. Labs printed.      HPI: Patient is a 83 y.o. female seen in the office today for routine follow up.   Seeing Dr Lovena Le in September- he started her on propafenone and feels like it makes her nauseous but plans to let them know at follow up. It is controlling her rate.   Went to Dr Allyson Sabal- has multiple lesions removed.   Energy level is low- always has been so active and always on the go and does not like feeling fatigued but sometimes just get very tired.   Tries to eat well, but does "cheat" likes hot dogs and hamburgers and will have that occasionally.  Has tired to cut back on sweets.    Review of  Systems:  Review of Systems  Constitutional: Negative for chills, fever and weight loss.       Fatigue   HENT: Negative for tinnitus.   Respiratory: Negative for cough, sputum production and shortness of breath.   Cardiovascular: Negative for chest pain, palpitations and leg swelling.  Gastrointestinal: Positive for heartburn. Negative for abdominal pain, constipation and diarrhea.  Genitourinary: Positive for frequency. Negative for dysuria and urgency.  Musculoskeletal: Positive for back pain and myalgias. Negative for falls and joint pain.  Skin: Negative.   Neurological: Negative for dizziness and headaches.  Psychiatric/Behavioral: Negative for depression and memory loss. The patient does not have insomnia.     Past Medical History:  Diagnosis Date  . Allergic rhinitis due to pollen   . Anxiety state, unspecified   . Arthritis    "right knee" (06/21/2016)  . Asymptomatic varicose veins   . Carpal tunnel syndrome   . Chest pain, unspecified   . Chronic lower back pain    "on the left side" (06/21/2016)  . Cramp of limb   . Depressive disorder, not elsewhere classified   . Diaphragmatic hernia without mention of obstruction or gangrene   . Diaphragmatic hernia without mention of obstruction or gangrene   . Disturbance of skin sensation   . Diverticulosis of  colon (without mention of hemorrhage)   . Enthesopathy of hip region   . Esophageal reflux   . Hepatitis, unspecified   . History of blood transfusion    "w/both knee replacements"  . History of duodenal ulcer   . Insomnia, unspecified   . Lumbago   . Migraine    "none since ~ 1990" (06/21/2016)  . Myalgia and myositis, unspecified   . Obesity, unspecified   . Other abnormal blood chemistry   . Other dysphagia   . Other malaise and fatigue   . Other nonspecific abnormal serum enzyme levels   . Other specified cardiac dysrhythmias(427.89)   . Pacemaker   . Pain in joint, ankle and foot   . Pain in joint, hand   .  Pain in joint, lower leg   . Pain in joint, pelvic region and thigh   . Pain in limb   . Plantar fascial fibromatosis   . Presence of permanent cardiac pacemaker   . Reflux esophagitis   . Sciatica   . Spinal stenosis, unspecified region other than cervical   . Stricture and stenosis of esophagus   . Symptomatic menopausal or female climacteric states   . Unspecified essential hypertension   . Unspecified essential hypertension   . Unspecified vitamin D deficiency    Past Surgical History:  Procedure Laterality Date  . BREAST BIOPSY Left 1990s X 2  . CARDIOVERSION N/A 04/26/2016   Procedure: CARDIOVERSION;  Surgeon: Pixie Casino, MD;  Location: [place];  Service: Cardiovascular;  Laterality: N/A;  . CATARACT EXTRACTION W/ INTRAOCULAR LENS IMPLANT Left 08/03/1999   DR EPES   . CATARACT EXTRACTION W/ INTRAOCULAR LENS IMPLANT Right 2000   DR EPES  . [place]  . COLONOSCOPY  1988   Normal   . DENTAL SURGERY Left 08/2016  . EP IMPLANTABLE DEVICE N/A 06/21/2016   Procedure: Pacemaker Implant;  Surgeon: Evans Lance, MD;  Location: [place] [facility];  Service: Cardiovascular;  Laterality: N/A;  . ESOPHAGOGASTRODUODENOSCOPY (EGD) WITH ESOPHAGEAL DILATION  ~ 1982   Dr. Sharlett Iles  . EXCISION OF ACTINIC KERATOSIS     DR LUPTON   . EYE SURGERY    . INSERT / REPLACE / REMOVE PACEMAKER    . JOINT REPLACEMENT    . KNEE ARTHROSCOPY Left 2003  . KNEE ARTHROSCOPY Right 06-26-13  . KNEE CLOSED REDUCTION Right 10/23/2013   Procedure: CLOSED MANIPULATION RIGHT KNEE;  Surgeon: Mcarthur Rossetti, MD;  Location: [place];  Service: Orthopedics;  Laterality: Right;  . LASER FOR CLOUDY CAP LEFT EYE Left 03/2006   DR DIGBY  . TOTAL KNEE ARTHROPLASTY Left 04/2004   DR RENDALL  . TOTAL KNEE ARTHROPLASTY Right 07/04/2013   Procedure: RIGHT TOTAL KNEE ARTHROPLASTY;  Surgeon: Mcarthur Rossetti, MD;  Location: [facility];  Service: Orthopedics;  Laterality: Right;    Marland Kitchen VAGINAL HYSTERECTOMY  1979   Social History:   reports that she has never smoked. She has never used smokeless tobacco. She reports that she does not drink alcohol or use drugs.  Family History  Problem Relation Age of Onset  . Ovarian cancer Mother   . Uterine cancer Mother   . Cerebrovascular Accident Mother   . Heart disease Father   . Hypertension Brother   . Obesity Daughter     Medications: Patient's Medications  New Prescriptions   No medications on file  Previous Medications   ACETAMINOPHEN (TYLENOL) 500 MG TABLET  Take 500 mg by mouth every 6 (six) hours as needed for mild pain.    ALPRAZOLAM (XANAX) 0.25 MG TABLET    Take one tablet by mouth at bedtime as needed for rest or anxiety   ANTISEPTIC ORAL RINSE (BIOTENE) LIQD    15 mLs by Mouth Rinse route daily as needed for dry mouth.   APIXABAN (ELIQUIS) 5 MG TABS TABLET    Take one tablet by mouth twice daily for anticoagulation   BIMATOPROST (LUMIGAN) 0.01 % SOLN    Instill 1 drop into both eyes once daily in the evening   BIOTIN 5000 PO    Take 1 capsule by mouth daily.   CHOLECALCIFEROL (VITAMIN D) 2000 UNITS TABLET    Take 2,000 Units by mouth daily.   DORZOLAMIDE (TRUSOPT) 2 % OPHTHALMIC SOLUTION    Place 1 drop into both eyes 3 (three) times daily.    FUROSEMIDE (LASIX) 20 MG TABLET    Take 20 mg by mouth as needed for edema.    KETOPROFEN EX    Apply 1 application topically daily as needed (pain). 20% cream; PRN for pain.   METHYLCELLULOSE (ARTIFICIAL TEARS) 1 % OPHTHALMIC SOLUTION    Place 1 drop into both eyes daily as needed (for dry eyes).    METOPROLOL TARTRATE (LOPRESSOR) 50 MG TABLET    TAKE 1 TABLET BY MOUTH TWICE A DAY   PANTOPRAZOLE (PROTONIX) 40 MG TABLET    TAKE 1 TABLET BY MOUTH NIGHTLY TO REDUCE STOMACH ACID   PROPAFENONE (RYTHMOL SR) 225 MG 12 HR CAPSULE    Take 1 capsule (225 mg total) by mouth 2 (two) times daily.   TRAZODONE (DESYREL) 50 MG TABLET    One nightly to help sleep    VALSARTAN-HYDROCHLOROTHIAZIDE (DIOVAN-HCT) 320-25 MG TABLET    TAKE 1 TABLET BY MOUTH EVERY DAY  Modified Medications   Modified Medication Previous Medication   ZOSTER VAC RECOMB ADJUVANTED (SHINGRIX) INJECTION Zoster Vac Recomb Adjuvanted (SHINGRIX) injection      Inject 0.5 mLs into the muscle once.    Inject 0.5 mLs into the muscle once.  Discontinued Medications   DOXYCYCLINE (VIBRA-TABS) 100 MG TABLET    Take 1 tablet (100 mg total) by mouth 2 (two) times daily.     Physical Exam:  Vitals:   08/09/17 1449  BP: 120/84  Pulse: 63  Resp: 17  Temp: 98.3 F (36.8 C)  TempSrc: Oral  SpO2: 96%  Weight: 184 lb 9.6 oz (83.7 kg)  Height: 5\' 2"  (1.575 m)   Body mass index is 33.76 kg/m.  Physical Exam  Constitutional: She is oriented to person, place, and time. No distress.  Obese.  HENT:  Head: Normocephalic and atraumatic.  Right Ear: External ear normal.  Left Ear: External ear normal.  Eyes: Pupils are equal, round, and reactive to light. Conjunctivae and EOM are normal. Left eye exhibits no discharge. No scleral icterus.  Neck: Normal range of motion. Neck supple.  Cardiovascular: Normal rate, regular rhythm and normal heart sounds.   Pulmonary/Chest: Effort normal and breath sounds normal.  Pacemaker in left upper chest wall.  Abdominal: Soft. Bowel sounds are normal. She exhibits no distension. There is no tenderness.  Musculoskeletal: She exhibits no edema.  Right knee S/P TKR. Tenderness at the left metatarsal heads.  Neurological: She is alert and oriented to person, place, and time.  12/12/16 MMSE 30/30. Passed clock drawing.  Psychiatric: She has a normal mood and affect. Her behavior is normal. Judgment and  thought content normal.    Labs reviewed: Basic Metabolic Panel:  Recent Labs  08/06/17 0918  NA 136  K 4.1  CL 105  CO2 27  GLUCOSE 93  BUN 14  CREATININE 0.74  CALCIUM 8.7   Liver Function Tests:  Recent Labs  08/06/17 0918  AST 16  ALT  14  ALKPHOS 55  BILITOT 0.5  PROT 6.2  ALBUMIN 3.6   No results for input(s): LIPASE, AMYLASE in the last 8760 hours. No results for input(s): AMMONIA in the last 8760 hours. CBC: No results for input(s): WBC, NEUTROABS, HGB, HCT, MCV, PLT in the last 8760 hours. Lipid Panel:  Recent Labs  08/06/17 0918  CHOL 197  HDL 35*  LDLCALC 120*  TRIG 208*  CHOLHDL 5.6*   TSH: No results for input(s): TSH in the last 8760 hours. A1C: Lab Results  Component Value Date   HGBA1C 5.2 08/06/2017     Assessment/Plan 1. Paroxysmal atrial fibrillation (HCC) Hx of hard to control a fib with bradycardia, now s/p pacemaker. Pt also has not tolerated antiarrythmic medications in the past due to side effects. Now on propafenone and suspects this is making her nauseous again and plans to discuss with cardiology. Currently rate is controlled but suspect medication and a fib may be contributing to fatigue.   2. Fatigue, unspecified type Will add TSH and CBC to lab. Suspect fatigue due to a fib and poor diet. Encouraged to improve dietary choices at this time.   3. Gastroesophageal reflux disease without esophagitis - esomeprazole (NEXIUM) 20 MG capsule; Take 1 capsule (20 mg total) by mouth daily.  Dispense: 30 capsule; Refill: 2  4. Infected skin tear Resolved.   5. Essential hypertension Stable on current regimen  6. Class 1 obesity due to excess calories with serious comorbidity and body mass index (BMI) of 32.0 to 32.9 in adult encouraged dietary changes and to cont exercise as tolerates.   Next appt: 4 months, sooner If needed Roza Creamer K. Harle Battiest  [facility] [phone] 8 am - 5 pm) [phone] (after hours)

## 2017-08-09 NOTE — Patient Instructions (Signed)
DASH Eating Plan DASH stands for "Dietary Approaches to Stop Hypertension." The DASH eating plan is a healthy eating plan that has been shown to reduce high blood pressure (hypertension). It may also reduce your risk for type 2 diabetes, heart disease, and stroke. The DASH eating plan may also help with weight loss. What are tips for following this plan? General guidelines  Avoid eating more than 2,300 mg (milligrams) of salt (sodium) a day. If you have hypertension, you may need to reduce your sodium intake to 1,500 mg a day.  Limit alcohol intake to no more than 1 drink a day for nonpregnant women and 2 drinks a day for men. One drink equals 12 oz of beer, 5 oz of wine, or 1 oz of hard liquor.  Work with your health care provider to maintain a healthy body weight or to lose weight. Ask what an ideal weight is for you.  Get at least 30 minutes of exercise that causes your heart to beat faster (aerobic exercise) most days of the week. Activities may include walking, swimming, or biking.  Work with your health care provider or diet and nutrition specialist (dietitian) to adjust your eating plan to your individual calorie needs. Reading food labels  Check food labels for the amount of sodium per serving. Choose foods with less than 5 percent of the Daily Value of sodium. Generally, foods with less than 300 mg of sodium per serving fit into this eating plan.  To find whole grains, look for the word "whole" as the first word in the ingredient list. Shopping  Buy products labeled as "low-sodium" or "no salt added."  Buy fresh foods. Avoid canned foods and premade or frozen meals. Cooking  Avoid adding salt when cooking. Use salt-free seasonings or herbs instead of table salt or sea salt. Check with your health care provider or pharmacist before using salt substitutes.  Do not fry foods. Cook foods using healthy methods such as baking, boiling, grilling, and broiling instead.  Cook with  heart-healthy oils, such as olive, canola, soybean, or sunflower oil. Meal planning   Eat a balanced diet that includes: ? 5 or more servings of fruits and vegetables each day. At each meal, try to fill half of your plate with fruits and vegetables. ? Up to 6-8 servings of whole grains each day. ? Less than 6 oz of lean meat, poultry, or fish each day. A 3-oz serving of meat is about the same size as a deck of cards. One egg equals 1 oz. ? 2 servings of low-fat dairy each day. ? A serving of nuts, seeds, or beans 5 times each week. ? Heart-healthy fats. Healthy fats called Omega-3 fatty acids are found in foods such as flaxseeds and coldwater fish, like sardines, salmon, and mackerel.  Limit how much you eat of the following: ? Canned or prepackaged foods. ? Food that is high in trans fat, such as fried foods. ? Food that is high in saturated fat, such as fatty meat. ? Sweets, desserts, sugary drinks, and other foods with added sugar. ? Full-fat dairy products.  Do not salt foods before eating.  Try to eat at least 2 vegetarian meals each week.  Eat more home-cooked food and less restaurant, buffet, and fast food.  When eating at a restaurant, ask that your food be prepared with less salt or no salt, if possible. What foods are recommended? The items listed may not be a complete list. Talk with your dietitian about what   dietary choices are best for you. Grains Whole-grain or whole-wheat bread. Whole-grain or whole-wheat pasta. Brown rice. Oatmeal. Quinoa. Bulgur. Whole-grain and low-sodium cereals. Pita bread. Low-fat, low-sodium crackers. Whole-wheat flour tortillas. Vegetables Fresh or frozen vegetables (raw, steamed, roasted, or grilled). Low-sodium or reduced-sodium tomato and vegetable juice. Low-sodium or reduced-sodium tomato sauce and tomato paste. Low-sodium or reduced-sodium canned vegetables. Fruits All fresh, dried, or frozen fruit. Canned fruit in natural juice (without  added sugar). Meat and other protein foods Skinless chicken or turkey. Ground chicken or turkey. Pork with fat trimmed off. Fish and seafood. Egg whites. Dried beans, peas, or lentils. Unsalted nuts, nut butters, and seeds. Unsalted canned beans. Lean cuts of beef with fat trimmed off. Low-sodium, lean deli meat. Dairy Low-fat (1%) or fat-free (skim) milk. Fat-free, low-fat, or reduced-fat cheeses. Nonfat, low-sodium ricotta or cottage cheese. Low-fat or nonfat yogurt. Low-fat, low-sodium cheese. Fats and oils Soft margarine without trans fats. Vegetable oil. Low-fat, reduced-fat, or light mayonnaise and salad dressings (reduced-sodium). Canola, safflower, olive, soybean, and sunflower oils. Avocado. Seasoning and other foods Herbs. Spices. Seasoning mixes without salt. Unsalted popcorn and pretzels. Fat-free sweets. What foods are not recommended? The items listed may not be a complete list. Talk with your dietitian about what dietary choices are best for you. Grains Baked goods made with fat, such as croissants, muffins, or some breads. Dry pasta or rice meal packs. Vegetables Creamed or fried vegetables. Vegetables in a cheese sauce. Regular canned vegetables (not low-sodium or reduced-sodium). Regular canned tomato sauce and paste (not low-sodium or reduced-sodium). Regular tomato and vegetable juice (not low-sodium or reduced-sodium). Pickles. Olives. Fruits Canned fruit in a light or heavy syrup. Fried fruit. Fruit in cream or butter sauce. Meat and other protein foods Fatty cuts of meat. Ribs. Fried meat. Bacon. Sausage. Bologna and other processed lunch meats. Salami. Fatback. Hotdogs. Bratwurst. Salted nuts and seeds. Canned beans with added salt. Canned or smoked fish. Whole eggs or egg yolks. Chicken or turkey with skin. Dairy Whole or 2% milk, cream, and half-and-half. Whole or full-fat cream cheese. Whole-fat or sweetened yogurt. Full-fat cheese. Nondairy creamers. Whipped toppings.  Processed cheese and cheese spreads. Fats and oils Butter. Stick margarine. Lard. Shortening. Ghee. Bacon fat. Tropical oils, such as coconut, palm kernel, or palm oil. Seasoning and other foods Salted popcorn and pretzels. Onion salt, garlic salt, seasoned salt, table salt, and sea salt. Worcestershire sauce. Tartar sauce. Barbecue sauce. Teriyaki sauce. Soy sauce, including reduced-sodium. Steak sauce. Canned and packaged gravies. Fish sauce. Oyster sauce. Cocktail sauce. Horseradish that you find on the shelf. Ketchup. Mustard. Meat flavorings and tenderizers. Bouillon cubes. Hot sauce and Tabasco sauce. Premade or packaged marinades. Premade or packaged taco seasonings. Relishes. Regular salad dressings. Where to find more information:  National Heart, Lung, and Blood Institute: www.nhlbi.nih.gov  American Heart Association: www.heart.org Summary  The DASH eating plan is a healthy eating plan that has been shown to reduce high blood pressure (hypertension). It may also reduce your risk for type 2 diabetes, heart disease, and stroke.  With the DASH eating plan, you should limit salt (sodium) intake to 2,300 mg a day. If you have hypertension, you may need to reduce your sodium intake to 1,500 mg a day.  When on the DASH eating plan, aim to eat more fresh fruits and vegetables, whole grains, lean proteins, low-fat dairy, and heart-healthy fats.  Work with your health care provider or diet and nutrition specialist (dietitian) to adjust your eating plan to your individual   calorie needs. This information is not intended to replace advice given to you by your health care provider. Make sure you discuss any questions you have with your health care provider. Document Released: 11/30/2011 Document Revised: 12/04/2016 Document Reviewed: 12/04/2016 Elsevier Interactive Patient Education  2017 [employer] Inc.  

## 2017-08-10 LAB — CBC
HCT: 37.1 % (ref 35.0–45.0)
Hemoglobin: 12.1 g/dL (ref 11.7–15.5)
MCH: 30.6 pg (ref 27.0–33.0)
MCHC: 32.6 g/dL (ref 32.0–36.0)
MCV: 93.9 fL (ref 80.0–100.0)
MPV: 9.9 fL (ref 7.5–12.5)
PLATELETS: 180 10*3/uL (ref 140–400)
RBC: 3.95 MIL/uL (ref 3.80–5.10)
RDW: 14.2 % (ref 11.0–15.0)
WBC: 5.7 10*3/uL (ref 3.8–10.8)

## 2017-08-10 LAB — TSH: TSH: 3.29 m[IU]/L

## 2017-08-30 ENCOUNTER — Ambulatory Visit (INDEPENDENT_AMBULATORY_CARE_PROVIDER_SITE_OTHER): Payer: Medicare Other | Admitting: Internal Medicine

## 2017-08-30 ENCOUNTER — Encounter: Payer: Self-pay | Admitting: Internal Medicine

## 2017-08-30 VITALS — BP 126/82 | HR 67 | Ht 62.0 in | Wt 184.6 lb

## 2017-08-30 DIAGNOSIS — R0602 Shortness of breath: Secondary | ICD-10-CM | POA: Diagnosis not present

## 2017-08-30 DIAGNOSIS — I499 Cardiac arrhythmia, unspecified: Secondary | ICD-10-CM | POA: Diagnosis not present

## 2017-08-30 DIAGNOSIS — R5383 Other fatigue: Secondary | ICD-10-CM | POA: Diagnosis not present

## 2017-08-30 NOTE — Progress Notes (Signed)
HPI [name] returns today for ongoing evaluation and management of paroxysmal atrial fibrillation and permanent pacemaker in the setting of sinus node dysfunction. The patient has a long history of tachycardia palpitations and atrial fibrillation with a rapid ventricular response. She was placed on flecainide initially which she could not tolerate, and then was given a permanent pacemaker, and propanolol for known originated in combination with her beta blocker. Her arrhythmias have improved nicely and she denies palpitations. She does note fatigue and weakness, particularly as the day goes on. She has mild peripheral edema and dyspnea. She denies angina and has not had palpitations. No syncope. Allergies  Allergen Reactions  . Advil [Ibuprofen]     GI upset  . Aleve [Naproxen Sodium]     GI upset  . Aspirin Nausea And Vomiting    Takes baby aspirin qod without problems  . Atorvastatin     Leg cramps  . Celebrex [Celecoxib]     GI upset  . Diclofenac     GI upset  . Doxycycline     Headache  . Metoclopramide Hcl     REACTION: heart palps  . Nsaids Other (See Comments)    Tears my stomach up   . Other     Antihistamine - increase BP  . Pravastatin     Leg cramps  . Timolol     Dropped HR     Current Outpatient Prescriptions  Medication Sig Dispense Refill  . acetaminophen (TYLENOL) 500 MG tablet Take 500 mg by mouth every 6 (six) hours as needed for mild pain.     Marland Kitchen ALPRAZolam (XANAX) 0.25 MG tablet Take one tablet by mouth at bedtime as needed for rest or anxiety 30 tablet 1  . antiseptic oral rinse (BIOTENE) LIQD 15 mLs by Mouth Rinse route daily as needed for dry mouth.    Marland Kitchen apixaban (ELIQUIS) 5 MG TABS tablet Take one tablet by mouth twice daily for anticoagulation 180 tablet 3  . bimatoprost (LUMIGAN) 0.01 % SOLN Instill 1 drop into both eyes once daily in the evening    . BIOTIN 5000 PO Take 1 capsule by mouth daily.    . Cholecalciferol (VITAMIN D) 2000  UNITS tablet Take 2,000 Units by mouth daily.    . dorzolamide (TRUSOPT) 2 % ophthalmic solution Place 1 drop into both eyes 3 (three) times daily.     Marland Kitchen esomeprazole (NEXIUM) 20 MG capsule Take 1 capsule (20 mg total) by mouth daily. 30 capsule 2  . furosemide (LASIX) 20 MG tablet Take 20 mg by mouth as needed for edema.     Marland Kitchen KETOPROFEN EX Apply 1 application topically daily as needed (pain). 20% cream; PRN for pain.    . methylcellulose (ARTIFICIAL TEARS) 1 % ophthalmic solution Place 1 drop into both eyes daily as needed (for dry eyes).     . metoprolol tartrate (LOPRESSOR) 50 MG tablet TAKE 1 TABLET BY MOUTH TWICE A DAY 60 tablet 11  . propafenone (RYTHMOL SR) 225 MG 12 hr capsule Take 1 capsule (225 mg total) by mouth 2 (two) times daily. 60 capsule 11  . traZODone (DESYREL) 50 MG tablet One nightly to help sleep 30 tablet 5  . valsartan-hydrochlorothiazide (DIOVAN-HCT) 320-25 MG tablet TAKE 1 TABLET BY MOUTH EVERY DAY 30 tablet 4   No current facility-administered medications for this visit.      Past Medical History:  Diagnosis Date  . Allergic rhinitis due to pollen   .  Anxiety state, unspecified   . Arthritis    "right knee" (06/21/2016)  . Asymptomatic varicose veins   . Carpal tunnel syndrome   . Chest pain, unspecified   . Chronic lower back pain    "on the left side" (06/21/2016)  . Cramp of limb   . Depressive disorder, not elsewhere classified   . Diaphragmatic hernia without mention of obstruction or gangrene   . Diaphragmatic hernia without mention of obstruction or gangrene   . Disturbance of skin sensation   . Diverticulosis of colon (without mention of hemorrhage)   . Enthesopathy of hip region   . Esophageal reflux   . Hepatitis, unspecified   . History of blood transfusion    "w/both knee replacements"  . History of duodenal ulcer   . Insomnia, unspecified   . Lumbago   . Migraine    "none since ~ 1990" (06/21/2016)  . Myalgia and myositis, unspecified     . Obesity, unspecified   . Other abnormal blood chemistry   . Other dysphagia   . Other malaise and fatigue   . Other nonspecific abnormal serum enzyme levels   . Other specified cardiac dysrhythmias(427.89)   . Pacemaker   . Pain in joint, ankle and foot   . Pain in joint, hand   . Pain in joint, lower leg   . Pain in joint, pelvic region and thigh   . Pain in limb   . Plantar fascial fibromatosis   . Presence of permanent cardiac pacemaker   . Reflux esophagitis   . Sciatica   . Spinal stenosis, unspecified region other than cervical   . Stricture and stenosis of esophagus   . Symptomatic menopausal or female climacteric states   . Unspecified essential hypertension   . Unspecified essential hypertension   . Unspecified vitamin D deficiency     ROS:   All systems reviewed and negative except as noted in the HPI.   Past Surgical History:  Procedure Laterality Date  . BREAST BIOPSY Left 1990s X 2  . CARDIOVERSION N/A 04/26/2016   Procedure: CARDIOVERSION;  Surgeon: Pixie Casino, MD;  Location: [facility];  Service: Cardiovascular;  Laterality: N/A;  . CATARACT EXTRACTION W/ INTRAOCULAR LENS IMPLANT Left 08/03/1999   DR EPES   . CATARACT EXTRACTION W/ INTRAOCULAR LENS IMPLANT Right 2000   DR EPES  . [place]  . COLONOSCOPY  1988   Normal   . DENTAL SURGERY Left 08/2016  . EP IMPLANTABLE DEVICE N/A 06/21/2016   Procedure: Pacemaker Implant;  Surgeon: Evans Lance, MD;  Location: [facility];  Service: Cardiovascular;  Laterality: N/A;  . ESOPHAGOGASTRODUODENOSCOPY (EGD) WITH ESOPHAGEAL DILATION  ~ 1982   Dr. Sharlett Iles  . EXCISION OF ACTINIC KERATOSIS     DR LUPTON   . EYE SURGERY    . INSERT / REPLACE / REMOVE PACEMAKER    . JOINT REPLACEMENT    . KNEE ARTHROSCOPY Left 2003  . KNEE ARTHROSCOPY Right 06-26-13  . KNEE CLOSED REDUCTION Right 10/23/2013   Procedure: CLOSED MANIPULATION RIGHT KNEE;  Surgeon: Mcarthur Rossetti, MD;  Location: [facility];  Service: Orthopedics;  Laterality: Right;  . LASER FOR CLOUDY CAP LEFT EYE Left 03/2006   DR DIGBY  . TOTAL KNEE ARTHROPLASTY Left 04/2004   DR RENDALL  . TOTAL KNEE ARTHROPLASTY Right 07/04/2013   Procedure: RIGHT TOTAL KNEE ARTHROPLASTY;  Surgeon: Mcarthur Rossetti, MD;  Location: [facility];  Service: Orthopedics;  Laterality: Right;  Marland Kitchen VAGINAL HYSTERECTOMY  1979     Family History  Problem Relation Age of Onset  . Ovarian cancer Mother   . Uterine cancer Mother   . Cerebrovascular Accident Mother   . Heart disease Father   . Hypertension Brother   . Obesity Daughter      Social History   Social History  . Marital status: Married    Spouse name: N/A  . Number of children: N/A  . Years of education: N/A   Occupational History  . Not on file.   Social History Main Topics  . Smoking status: Never Smoker  . Smokeless tobacco: Never Used  . Alcohol use No  . Drug use: No  . Sexual activity: Yes   Other Topics Concern  . Not on file   Social History Narrative  . No narrative on file     BP 126/82   Pulse 67   Ht 5\' 2"  (1.575 m)   Wt 184 lb 9.6 oz (83.7 kg)   SpO2 94%   BMI 33.76 kg/m   Physical Exam:  Well appearing 83 year old woman, NAD HEENT: Unremarkable Neck:  6 cm JVD, no thyromegally Lymphatics:  No adenopathy Back:  No CVA tenderness Lungs:  Clear, with no wheezes, rales, or rhonchi; well-healed pacemaker incision HEART:  Regular rate rhythm, no murmurs, no rubs, no clicks Abd:  soft, positive bowel sounds, no organomegally, no rebound, no guarding Ext:  2 plus pulses, no edema, no cyanosis, no clubbing Skin:  No rashes no nodules Neuro:  CN II through XII intact, motor grossly intact  DEVICE  Normal device function.  See PaceArt for details.   Assess/Plan: 1. Paroxysmal atrial fibrillation - she will continue her current medical therapy. Over the past 6 months, her atrial fibrillation has essentially  resolved on medical therapy. She will continue systemic anticoagulation. 2. Fatigue, weakness, and dyspnea - the etiology of her current symptoms is unclear. I've recommended that she undergo a 2-D echo. Based on the results of her echo, we may make additional adjustments in her diuretic therapy. 3. Hypertension - her blood pressure is well controlled. No change in medications 4. Sinus node dysfunction - she is asymptomatic status post dual-chamber pacemaker insertion. She is pacing in the atrium approximately two thirds the time.  Cristopher Peru, M.D.

## 2017-08-30 NOTE — Patient Instructions (Signed)
Medication Instructions:  Your physician recommends that you continue on your current medications as directed. Please refer to the Current Medication list given to you today.  Labwork: None ordered.  Testing/Procedures: Your physician has requested that you have an echocardiogram. Echocardiography is a painless test that uses sound waves to create images of your heart. It provides your doctor with information about the size and shape of your heart and how well your heart's chambers and valves are working. This procedure takes approximately one hour. There are no restrictions for this procedure.  Please schedule for a 2D ECHO.  Follow-Up: Your physician wants you to follow-up in: one year with Dr. Lovena Le.   You will receive a reminder letter in the mail two months in advance. If you don't receive a letter, please call our office to schedule the follow-up appointment.  Remote monitoring is used to monitor your Pacemaker from home. This monitoring reduces the number of office visits required to check your device to one time per year. It allows [place] to keep an eye on the functioning of your device to ensure it is working properly. You are scheduled for a device check from home on 09/24/2017. You may send your transmission at any time that day. If you have a wireless device, the transmission will be sent automatically. After your physician reviews your transmission, you will receive a postcard with your next transmission date.    Any Other Special Instructions Will Be Listed Below (If Applicable).     If you need a refill on your cardiac medications before your next appointment, please call your pharmacy.

## 2017-08-31 LAB — CUP PACEART INCLINIC DEVICE CHECK
Battery Remaining Longevity: 126 mo
Battery Voltage: 2.99 V
Date Time Interrogation Session: 20180906160215
Implantable Lead Implant Date: 20170628
Implantable Lead Location: 753859
Implantable Pulse Generator Implant Date: 20170628
Lead Channel Sensing Intrinsic Amplitude: 1.4 mV
Lead Channel Sensing Intrinsic Amplitude: 7.3 mV
MDC IDC LEAD IMPLANT DT: 20170628
MDC IDC LEAD LOCATION: 753860
MDC IDC PG SERIAL: 7910313
MDC IDC SET LEADCHNL RA PACING AMPLITUDE: 2 V
MDC IDC SET LEADCHNL RV PACING AMPLITUDE: 2.5 V
MDC IDC SET LEADCHNL RV PACING PULSEWIDTH: 0.4 ms
MDC IDC SET LEADCHNL RV SENSING SENSITIVITY: 2 mV
MDC IDC STAT BRADY RA PERCENT PACED: 63 %
MDC IDC STAT BRADY RV PERCENT PACED: 4.4 %

## 2017-09-03 ENCOUNTER — Other Ambulatory Visit: Payer: Self-pay | Admitting: Internal Medicine

## 2017-09-06 ENCOUNTER — Ambulatory Visit (HOSPITAL_COMMUNITY): Payer: Medicare Other | Attending: Cardiovascular Disease

## 2017-09-06 ENCOUNTER — Other Ambulatory Visit: Payer: Self-pay

## 2017-09-06 DIAGNOSIS — I10 Essential (primary) hypertension: Secondary | ICD-10-CM | POA: Diagnosis not present

## 2017-09-06 DIAGNOSIS — R0602 Shortness of breath: Secondary | ICD-10-CM | POA: Diagnosis not present

## 2017-09-06 DIAGNOSIS — R5383 Other fatigue: Secondary | ICD-10-CM | POA: Insufficient documentation

## 2017-09-07 ENCOUNTER — Ambulatory Visit: Payer: Medicare Other

## 2017-09-14 ENCOUNTER — Ambulatory Visit (INDEPENDENT_AMBULATORY_CARE_PROVIDER_SITE_OTHER): Payer: Medicare Other | Admitting: *Deleted

## 2017-09-14 DIAGNOSIS — Z23 Encounter for immunization: Secondary | ICD-10-CM

## 2017-09-20 NOTE — Progress Notes (Signed)
Cardiology Office Note   Date:  09/24/2017   ID:  ANNEKE CUNDY, DOB [birth year], MRN [id number]  PCP:  Lauree Chandler, NP  Cardiologist:   Gaby Harney Martinique, MD   Chief Complaint  Patient presents with  . Follow-up  . Atrial Fibrillation      History of Present Illness: [name] is a 83 y.o. female is seen for follow up Afibflutter. She has a long history of marked sinus bradycardia. She was seen by Dr. Aundra Dubin in 2014. Echo at that time showed mild LAE otherwise normal. Holter showed mean HR 59 with lowest HR 43 and peak HR 109. She reports her HR is typically 48 but she was asymptomatic. On March 9,2017 she noted an increased HR by BP monitor up to 153. At this time she felt marked indigestion from her waist to her neck. She felt her heart fluttering.   She was seen by Dr. Nyoka Cowden on 03/29/16 and found to be in Afib with RVR. She was started on Eliquis and metoprolol. Myoview study and Echo were normal.  She later had attempt at DCCV. She was in an atypical atrial flutter at that time. DCCV resulted in very prolonged pauses > 6 seconds and return to flutter. She was seen by Dr. Curt Bears and placed on flecainide. This did convert her to NSR but made her feel very sick with nausea, dizziness, and extreme fatigue. Flecainide was discontinued.. She continued to have marked bradycardia and underwent PPM placement on 06/21/16. When seen in September by Dr. Lovena Le she had an Afib burden of 94%. She was started on propafenone for her Afib. Initially this medication caused her to have more nausea but this has improved.  On last pacer check this had decreased to 37% and Dr. Tanna Furry note on 08/30/17 indicates almost complete resolution of Afib over the past 6 months.   She is followed by ortho with back and right hip pain. L5 and right hip injection recommended but she didn't want this done until she talked to me. She denies any palpitations, dizziness, edema. She does have some chronic fatigue but is  mainly limited by her orthopedic problems.    Past Medical History:  Diagnosis Date  . Allergic rhinitis due to pollen   . Anxiety state, unspecified   . Arthritis    "right knee" (06/21/2016)  . Asymptomatic varicose veins   . Carpal tunnel syndrome   . Chest pain, unspecified   . Chronic lower back pain    "on the left side" (06/21/2016)  . Cramp of limb   . Depressive disorder, not elsewhere classified   . Diaphragmatic hernia without mention of obstruction or gangrene   . Diaphragmatic hernia without mention of obstruction or gangrene   . Disturbance of skin sensation   . Diverticulosis of colon (without mention of hemorrhage)   . Enthesopathy of hip region   . Esophageal reflux   . Hepatitis, unspecified   . History of blood transfusion    "w/both knee replacements"  . History of duodenal ulcer   . Insomnia, unspecified   . Lumbago   . Migraine    "none since ~ 1990" (06/21/2016)  . Myalgia and myositis, unspecified   . Obesity, unspecified   . Other abnormal blood chemistry   . Other dysphagia   . Other malaise and fatigue   . Other nonspecific abnormal serum enzyme levels   . Other specified cardiac dysrhythmias(427.89)   . Pacemaker   . Pain in  joint, ankle and foot   . Pain in joint, hand   . Pain in joint, lower leg   . Pain in joint, pelvic region and thigh   . Pain in limb   . Plantar fascial fibromatosis   . Presence of permanent cardiac pacemaker   . Reflux esophagitis   . Sciatica   . Spinal stenosis, unspecified region other than cervical   . Stricture and stenosis of esophagus   . Symptomatic menopausal or female climacteric states   . Unspecified essential hypertension   . Unspecified essential hypertension   . Unspecified vitamin D deficiency     Past Surgical History:  Procedure Laterality Date  . BREAST BIOPSY Left 1990s X 2  . CARDIOVERSION N/A 04/26/2016   Procedure: CARDIOVERSION;  Surgeon: Pixie Casino, MD;  Location: [facility];   Service: Cardiovascular;  Laterality: N/A;  . CATARACT EXTRACTION W/ INTRAOCULAR LENS IMPLANT Left 08/03/1999   DR EPES   . CATARACT EXTRACTION W/ INTRAOCULAR LENS IMPLANT Right 2000   DR EPES  . [place]  . COLONOSCOPY  1988   Normal   . DENTAL SURGERY Left 08/2016  . EP IMPLANTABLE DEVICE N/A 06/21/2016   Procedure: Pacemaker Implant;  Surgeon: Evans Lance, MD;  Location: [facility];  Service: Cardiovascular;  Laterality: N/A;  . ESOPHAGOGASTRODUODENOSCOPY (EGD) WITH ESOPHAGEAL DILATION  ~ 1982   Dr. Sharlett Iles  . EXCISION OF ACTINIC KERATOSIS     DR LUPTON   . EYE SURGERY    . INSERT / REPLACE / REMOVE PACEMAKER    . JOINT REPLACEMENT    . KNEE ARTHROSCOPY Left 2003  . KNEE ARTHROSCOPY Right 06-26-13  . KNEE CLOSED REDUCTION Right 10/23/2013   Procedure: CLOSED MANIPULATION RIGHT KNEE;  Surgeon: Mcarthur Rossetti, MD;  Location: [facility];  Service: Orthopedics;  Laterality: Right;  . LASER FOR CLOUDY CAP LEFT EYE Left 03/2006   DR DIGBY  . TOTAL KNEE ARTHROPLASTY Left 04/2004   DR RENDALL  . TOTAL KNEE ARTHROPLASTY Right 07/04/2013   Procedure: RIGHT TOTAL KNEE ARTHROPLASTY;  Surgeon: Mcarthur Rossetti, MD;  Location: [facility];  Service: Orthopedics;  Laterality: Right;  Marland Kitchen VAGINAL HYSTERECTOMY  1979     Current Outpatient Prescriptions  Medication Sig Dispense Refill  . acetaminophen (TYLENOL) 500 MG tablet Take 500 mg by mouth every 6 (six) hours as needed for mild pain.     Marland Kitchen ALPRAZolam (XANAX) 0.25 MG tablet Take one tablet by mouth at bedtime as needed for rest or anxiety 30 tablet 1  . antiseptic oral rinse (BIOTENE) LIQD 15 mLs by Mouth Rinse route daily as needed for dry mouth.    Marland Kitchen apixaban (ELIQUIS) 5 MG TABS tablet Take one tablet by mouth twice daily for anticoagulation 180 tablet 3  . bimatoprost (LUMIGAN) 0.01 % SOLN Instill 1 drop into both eyes once daily in the evening    . BIOTIN 5000 PO Take 1 capsule by mouth  daily.    . Cholecalciferol (VITAMIN D) 2000 UNITS tablet Take 2,000 Units by mouth daily.    . dorzolamide (TRUSOPT) 2 % ophthalmic solution Place 1 drop into both eyes 3 (three) times daily.     Marland Kitchen esomeprazole (NEXIUM) 20 MG capsule Take 1 capsule (20 mg total) by mouth daily. 30 capsule 2  . furosemide (LASIX) 20 MG tablet Take 20 mg by mouth as needed for edema.     Marland Kitchen KETOPROFEN EX Apply 1  application topically daily as needed (pain). 20% cream; PRN for pain.    . methylcellulose (ARTIFICIAL TEARS) 1 % ophthalmic solution Place 1 drop into both eyes daily as needed (for dry eyes).     . metoprolol tartrate (LOPRESSOR) 50 MG tablet TAKE 1 TABLET BY MOUTH TWICE A DAY 60 tablet 11  . propafenone (RYTHMOL SR) 225 MG 12 hr capsule Take 1 capsule (225 mg total) by mouth 2 (two) times daily. 180 capsule 3  . traZODone (DESYREL) 50 MG tablet One nightly to help sleep 30 tablet 5  . valsartan-hydrochlorothiazide (DIOVAN-HCT) 320-25 MG tablet TAKE 1 TABLET BY MOUTH EVERY DAY 30 tablet 4   No current facility-administered medications for this visit.     Allergies:   Advil [ibuprofen]; Aleve [naproxen sodium]; Aspirin; Atorvastatin; Celebrex [celecoxib]; Diclofenac; Doxycycline; Metoclopramide hcl; Nsaids; Other; Pravastatin; and Timolol    Social History:  The patient  reports that she has never smoked. She has never used smokeless tobacco. She reports that she does not drink alcohol or use drugs.   Family History:  The patient's family history includes Cerebrovascular Accident in her mother; Heart disease in her father; Hypertension in her brother; Obesity in her daughter; Ovarian cancer in her mother; Uterine cancer in her mother.    ROS:  Please see the history of present illness.   Otherwise, review of systems are positive for none.   All other systems are reviewed and negative.    PHYSICAL EXAM: VS:  BP (!) 158/90   Pulse 63   Ht 5\' 2"  (1.575 m)   Wt 184 lb (83.5 kg)   BMI 33.65 kg/m  ,  BMI Body mass index is 33.65 kg/m. GENERAL:  Well appearing WF in NAD HEENT:  PERRL, EOMI, sclera are clear. Oropharynx is clear. NECK:  No jugular venous distention, carotid upstroke brisk and symmetric, no bruits, no thyromegaly or adenopathy LUNGS:  Clear to auscultation bilaterally CHEST:  Unremarkable HEART:  RRR,  PMI not displaced or sustained,S1 and S2 within normal limits, no S3, no S4: no clicks, no rubs, no murmurs ABD:  Soft, nontender. BS +, no masses or bruits. No hepatomegaly, no splenomegaly EXT:  2 + pulses throughout, no edema, no cyanosis no clubbing SKIN:  Warm and dry.  No rashes NEURO:  Alert and oriented x 3. Cranial nerves II through XII intact. PSYCH:  Cognitively intact     EKG:  EKG is  ordered today. Atrially paced. Rate 63. Nonspecific ST-T wave changes.      Recent Labs: 08/06/2017: ALT 14; BUN 14; Creat 0.74; Hemoglobin 12.1; Platelets 180; Potassium 4.1; Sodium 136; TSH 3.29    Lipid Panel    Component Value Date/Time   CHOL 197 08/06/2017 0918   CHOL 179 03/27/2016 0853   TRIG 208 (H) 08/06/2017 0918   HDL 35 (L) 08/06/2017 0918   HDL 42 03/27/2016 0853   CHOLHDL 5.6 (H) 08/06/2017 0918   VLDL 42 (H) 08/06/2017 0918   LDLCALC 120 (H) 08/06/2017 0918   LDLCALC 99 03/27/2016 0853      Wt Readings from Last 3 Encounters:  09/24/17 184 lb (83.5 kg)  08/30/17 184 lb 9.6 oz (83.7 kg)  08/09/17 184 lb 9.6 oz (83.7 kg)      Other studies Reviewed: Additional studies/ records that were reviewed today include:  Echo: 09/06/17: Study Conclusions  - Left ventricle: The cavity size was normal. Wall thickness was   increased in a pattern of mild LVH. Systolic function was  normal.   The estimated ejection fraction was in the range of 55% to 60%.   Doppler parameters are consistent with both elevated ventricular   end-diastolic filling pressure and elevated left atrial filling   pressure. - Left atrium: The atrium was moderately dilated. -  Atrial septum: There was increased thickness of the septum,   consistent with lipomatous hypertrophy. No defect or patent   foramen ovale was identified  ASSESSMENT AND PLAN:  1.  Atrial fibrillation/flutter with RVR. Unsuccessful DCCV with marked conversion pauses. She did convert on flecainide but unable to tolerate this medication. Now s/p PPM placement. Started on propafenone for Afib last year. Afib burden has decreased significantly. She is clearly doing better on medication.   2. HTN well controlled.  3. Mild hypercholesterolemia.   4. Fatigue- Echo showed normal LV function. I suspect this is mainly related to her ortho issues. I have encouraged her to go ahead and have injection recommended by ortho. OK to hold Xarelto for 3 days.     Current medicines are reviewed at length with the patient today.  The patient does not have concerns regarding medicines.  The following changes have been made:    Labs/ tests ordered today include:   Orders Placed This Encounter  Procedures  . EKG 12-Lead     Disposition: follow up with me in 6 months.   Signed, Iqra Rotundo Martinique, [place]  09/24/2017 5:50 PM    [facility] [address] [place], [place], [address] Phone [phone], Fax [phone]

## 2017-09-24 ENCOUNTER — Encounter: Payer: Self-pay | Admitting: Cardiology

## 2017-09-24 ENCOUNTER — Ambulatory Visit (INDEPENDENT_AMBULATORY_CARE_PROVIDER_SITE_OTHER): Payer: Medicare Other | Admitting: Cardiology

## 2017-09-24 ENCOUNTER — Ambulatory Visit (INDEPENDENT_AMBULATORY_CARE_PROVIDER_SITE_OTHER): Payer: Medicare Other | Admitting: *Deleted

## 2017-09-24 VITALS — BP 158/90 | HR 63 | Ht 62.0 in | Wt 184.0 lb

## 2017-09-24 DIAGNOSIS — I480 Paroxysmal atrial fibrillation: Secondary | ICD-10-CM

## 2017-09-24 DIAGNOSIS — E7800 Pure hypercholesterolemia, unspecified: Secondary | ICD-10-CM | POA: Diagnosis not present

## 2017-09-24 DIAGNOSIS — I10 Essential (primary) hypertension: Secondary | ICD-10-CM

## 2017-09-24 NOTE — Progress Notes (Signed)
Remote pacemaker transmission.   

## 2017-09-24 NOTE — Patient Instructions (Addendum)
Go ahead and have your injection done  I will see you in 6 months

## 2017-09-26 ENCOUNTER — Encounter: Payer: Self-pay | Admitting: Cardiology

## 2017-09-27 LAB — CUP PACEART REMOTE DEVICE CHECK
Battery Remaining Percentage: 95.5 %
Brady Statistic AP VS Percent: 65 %
Brady Statistic AS VS Percent: 30 %
Brady Statistic RA Percent Paced: 65 %
Implantable Lead Location: 753859
Lead Channel Impedance Value: 490 Ohm
Lead Channel Pacing Threshold Amplitude: 0.5 V
Lead Channel Pacing Threshold Amplitude: 1 V
Lead Channel Pacing Threshold Pulse Width: 0.4 ms
Lead Channel Pacing Threshold Pulse Width: 0.4 ms
Lead Channel Sensing Intrinsic Amplitude: 6.6 mV
Lead Channel Setting Pacing Amplitude: 2 V
Lead Channel Setting Pacing Amplitude: 2.5 V
MDC IDC LEAD IMPLANT DT: 20170628
MDC IDC LEAD IMPLANT DT: 20170628
MDC IDC LEAD LOCATION: 753860
MDC IDC MSMT BATTERY REMAINING LONGEVITY: 120 mo
MDC IDC MSMT BATTERY VOLTAGE: 2.99 V
MDC IDC MSMT LEADCHNL RA SENSING INTR AMPL: 2.5 mV
MDC IDC MSMT LEADCHNL RV IMPEDANCE VALUE: 540 Ohm
MDC IDC PG IMPLANT DT: 20170628
MDC IDC SESS DTM: 20181001060015
MDC IDC SET LEADCHNL RV PACING PULSEWIDTH: 0.4 ms
MDC IDC SET LEADCHNL RV SENSING SENSITIVITY: 2 mV
MDC IDC STAT BRADY AP VP PERCENT: 1 %
MDC IDC STAT BRADY AS VP PERCENT: 3.9 %
MDC IDC STAT BRADY RV PERCENT PACED: 4.2 %
Pulse Gen Serial Number: 7910313

## 2017-10-01 ENCOUNTER — Ambulatory Visit (INDEPENDENT_AMBULATORY_CARE_PROVIDER_SITE_OTHER): Payer: Medicare Other

## 2017-10-01 ENCOUNTER — Encounter (INDEPENDENT_AMBULATORY_CARE_PROVIDER_SITE_OTHER): Payer: Self-pay | Admitting: Physician Assistant

## 2017-10-01 ENCOUNTER — Ambulatory Visit (INDEPENDENT_AMBULATORY_CARE_PROVIDER_SITE_OTHER): Payer: Medicare Other | Admitting: Physician Assistant

## 2017-10-01 VITALS — Ht 63.0 in | Wt 178.0 lb

## 2017-10-01 DIAGNOSIS — G8929 Other chronic pain: Secondary | ICD-10-CM | POA: Diagnosis not present

## 2017-10-01 DIAGNOSIS — M5441 Lumbago with sciatica, right side: Secondary | ICD-10-CM | POA: Diagnosis not present

## 2017-10-01 DIAGNOSIS — M25561 Pain in right knee: Secondary | ICD-10-CM

## 2017-10-02 ENCOUNTER — Telehealth (INDEPENDENT_AMBULATORY_CARE_PROVIDER_SITE_OTHER): Payer: Self-pay | Admitting: Physician Assistant

## 2017-10-02 ENCOUNTER — Other Ambulatory Visit (INDEPENDENT_AMBULATORY_CARE_PROVIDER_SITE_OTHER): Payer: Self-pay

## 2017-10-02 DIAGNOSIS — M545 Low back pain: Secondary | ICD-10-CM

## 2017-10-02 NOTE — Telephone Encounter (Signed)
Order is in I apologize, I haven't gotten to put all my orders in from yesterday yet

## 2017-10-02 NOTE — Progress Notes (Signed)
Office Visit Note   Patient: [name]           Date of Birth: Feb 10, 1931           MRN: [id number] Visit Date: 10/01/2017              Requested by: Lauree Chandler, NP [place], [place] [address] PCP: Lauree Chandler, NP   Assessment & Plan: Visit Diagnoses:  1. Acute pain of right knee   2. Chronic right-sided low back pain with right-sided sciatica     Plan:Consultation with Dr. Ernestina Patches for possible right L5 transforaminal injection. But she wants to discuss performing the injection on Eliquis verses coming off the anti-coag. She is very concerned of having a possible stroke if she comes off Eliquis despite her cardiologist telling her she is ok to to come off  for 3 days in order to have the injection.   Follow-Up Instructions: Return if symptoms worsen or fail to improve.   Orders:  Orders Placed This Encounter  Procedures  . XR Knee 1-2 Views Right   No orders of the defined types were placed in this encounter.     Procedures: No procedures performed   Clinical Data: No additional findings.   Subjective: Chief Complaint  Patient presents with  . Right Knee - Pain  . Right Hip - Pain    HPI [name] returns today due to right hip pain in right knee pain. She's had ongoing right hip and radicular pain down her right leg for some time now. She's had a right L5 transforaminal injection by Dr. Ernestina Patches in the past which really did help with her pain. Unfortunately though she is now on L occlusal and had some concerns about having a injection while on the elk list. She did talk to her cardiologist Dr. Martinique who stated that she could come off of her elk list for 3 days prior to having an injection for her back. She has concerns about this due to the fact that she has family members that have had strokes in the past. I spoke with her in the past about having a transforaminal injection on anticoagulants which I had spoken to Dr. Ernestina Patches who is  comfortable doing a transforaminal injection under these conditions. Pain in the low back is worse down the leg when lying down. Greatly inhibits her activities. She and her husband are planning a trip to [place] in the near future and should like to be able to walk without pain. She also unfortunately fell in July and injured her right knee she is having pain in the knee but somewhat difficult for her to differentiate the pain that she is having in the knee from the pain that she is having due to her back. She is using Aspercreme with lidocaine and occasionally a ketoprofen compounded topical along the. She states this does help. Review of Systems The history of present illness otherwise negative  Objective: Vital Signs: Ht 5\' 3"  (1.6 m)   Wt 178 lb (80.7 kg)   BMI 31.53 kg/m   Physical Exam  Constitutional: She is oriented to person, place, and time. She appears well-developed and well-nourished. No distress.  Neurological: She is alert and oriented to person, place, and time.  Skin: She is not diaphoretic.  Psychiatric: She has a normal mood and affect. Her behavior is normal.    Ortho Exam Right knee no effusion abnormal warmth erythema or rashes or skin lesions.  Full extension and full flexion and no instability. Nontender over the right trochanteric region. Specialty Comments:  No specialty comments available.  Imaging: Xr Knee 1-2 Views Right  Result Date: 10/02/2017 Right knee AP lateral: No acute fracture. Status post right total knee arthroplasty without any evidence of loosening or hardware failure. Knee is well located.    PMFS History: Patient Active Problem List   Diagnosis Date Noted  . Trochanteric bursitis, right hip 05/16/2017  . Cardiac pacemaker in situ 06/21/2016  . Atypical atrial flutter ([place])   . Chest pain 04/10/2016  . Atrial fibrillation ([place]) 03/29/2016  . Hyperglycemia 02/17/2015  . Obese 02/17/2015  . Insomnia 01/27/2014  . Knee ankylosis, right knee  10/23/2013  . Knee joint replacement by other means 10/01/2013  . Arthritis of knee, right 07/04/2013  . Osteoarthritis of right knee 07/01/2013  . Cervicalgia 07/01/2013  . Bradycardia 05/13/2013  . HYPERCHOLESTEROLEMIA 06/02/2009  . ANXIETY 06/02/2009  . Essential hypertension 06/02/2009  . ESOPHAGEAL STRICTURE 06/02/2009  . GERD 06/02/2009  . HIATAL HERNIA 06/02/2009  . DIVERTICULOSIS, COLON 06/02/2009  . Dysphagia, pharyngoesophageal phase 06/02/2009  . Backache 05/01/2007  . LEG PAIN, RIGHT 05/01/2007   Past Medical History:  Diagnosis Date  . Allergic rhinitis due to pollen   . Anxiety state, unspecified   . Arthritis    "right knee" (06/21/2016)  . Asymptomatic varicose veins   . Carpal tunnel syndrome   . Chest pain, unspecified   . Chronic lower back pain    "on the left side" (06/21/2016)  . Cramp of limb   . Depressive disorder, not elsewhere classified   . Diaphragmatic hernia without mention of obstruction or gangrene   . Diaphragmatic hernia without mention of obstruction or gangrene   . Disturbance of skin sensation   . Diverticulosis of colon (without mention of hemorrhage)   . Enthesopathy of hip region   . Esophageal reflux   . Hepatitis, unspecified   . History of blood transfusion    "w/both knee replacements"  . History of duodenal ulcer   . Insomnia, unspecified   . Lumbago   . Migraine    "none since ~ 1990" (06/21/2016)  . Myalgia and myositis, unspecified   . Obesity, unspecified   . Other abnormal blood chemistry   . Other dysphagia   . Other malaise and fatigue   . Other nonspecific abnormal serum enzyme levels   . Other specified cardiac dysrhythmias(427.89)   . Pacemaker   . Pain in joint, ankle and foot   . Pain in joint, hand   . Pain in joint, lower leg   . Pain in joint, pelvic region and thigh   . Pain in limb   . Plantar fascial fibromatosis   . Presence of permanent cardiac pacemaker   . Reflux esophagitis   . Sciatica   .  Spinal stenosis, unspecified region other than cervical   . Stricture and stenosis of esophagus   . Symptomatic menopausal or female climacteric states   . Unspecified essential hypertension   . Unspecified essential hypertension   . Unspecified vitamin D deficiency     Family History  Problem Relation Age of Onset  . Ovarian cancer Mother   . Uterine cancer Mother   . Cerebrovascular Accident Mother   . Heart disease Father   . Hypertension Brother   . Obesity Daughter     Past Surgical History:  Procedure Laterality Date  . BREAST BIOPSY Left 1990s X 2  .  CARDIOVERSION N/A 04/26/2016   Procedure: CARDIOVERSION;  Surgeon: Pixie Casino, MD;  Location: [facility];  Service: Cardiovascular;  Laterality: N/A;  . CATARACT EXTRACTION W/ INTRAOCULAR LENS IMPLANT Left 08/03/1999   DR EPES   . CATARACT EXTRACTION W/ INTRAOCULAR LENS IMPLANT Right 2000   DR EPES  . [place]  . COLONOSCOPY  1988   Normal   . DENTAL SURGERY Left 08/2016  . EP IMPLANTABLE DEVICE N/A 06/21/2016   Procedure: Pacemaker Implant;  Surgeon: Evans Lance, MD;  Location: [facility];  Service: Cardiovascular;  Laterality: N/A;  . ESOPHAGOGASTRODUODENOSCOPY (EGD) WITH ESOPHAGEAL DILATION  ~ 1982   Dr. Sharlett Iles  . EXCISION OF ACTINIC KERATOSIS     DR LUPTON   . EYE SURGERY    . INSERT / REPLACE / REMOVE PACEMAKER    . JOINT REPLACEMENT    . KNEE ARTHROSCOPY Left 2003  . KNEE ARTHROSCOPY Right 06-26-13  . KNEE CLOSED REDUCTION Right 10/23/2013   Procedure: CLOSED MANIPULATION RIGHT KNEE;  Surgeon: Mcarthur Rossetti, MD;  Location: [facility];  Service: Orthopedics;  Laterality: Right;  . LASER FOR CLOUDY CAP LEFT EYE Left 03/2006   DR DIGBY  . TOTAL KNEE ARTHROPLASTY Left 04/2004   DR RENDALL  . TOTAL KNEE ARTHROPLASTY Right 07/04/2013   Procedure: RIGHT TOTAL KNEE ARTHROPLASTY;  Surgeon: Mcarthur Rossetti, MD;  Location[facility];  Service: Orthopedics;  Laterality:  Right;  Marland Kitchen VAGINAL HYSTERECTOMY  1979   Social History   Occupational History  . Not on file.   Social History Main Topics  . Smoking status: Never Smoker  . Smokeless tobacco: Never Used  . Alcohol use No  . Drug use: No  . Sexual activity: Yes

## 2017-10-04 ENCOUNTER — Telehealth: Payer: Self-pay | Admitting: Nurse Practitioner

## 2017-10-04 NOTE — Telephone Encounter (Signed)
Left msg asking pt to confirm AWV-S w/ nurse. (Last AWV 12/12/16-[employer] calendar yr). VDM (DD)

## 2017-10-16 ENCOUNTER — Other Ambulatory Visit: Payer: Self-pay | Admitting: Internal Medicine

## 2017-10-25 ENCOUNTER — Encounter (INDEPENDENT_AMBULATORY_CARE_PROVIDER_SITE_OTHER): Payer: Self-pay | Admitting: Physical Medicine and Rehabilitation

## 2017-10-25 ENCOUNTER — Ambulatory Visit (INDEPENDENT_AMBULATORY_CARE_PROVIDER_SITE_OTHER): Payer: Medicare Other | Admitting: Physical Medicine and Rehabilitation

## 2017-10-25 VITALS — BP 170/86 | HR 62

## 2017-10-25 DIAGNOSIS — M5416 Radiculopathy, lumbar region: Secondary | ICD-10-CM

## 2017-10-25 NOTE — Progress Notes (Deleted)
Right sided lower back pain. Buttock pain radiates down right leg to ankle. Worse after first getting up in the morning. Has a pacemaker now and is taking eliquis. Says that one of her doctors told her she couldn't have any more epidurals because of her pacemaker.

## 2017-10-30 ENCOUNTER — Telehealth: Payer: Self-pay

## 2017-10-30 NOTE — Telephone Encounter (Signed)
Received a call from patient.She stated she is having right lower back.Stated she saw Dr.Newton he advised a epidural or L-5 trans foramina.Stated she would like Dr.Jordan's opinion.Spoke to Juliaetta he advised to follow Dr.Newton's recommendations.Advised ok to hold Eliquis 3 days.

## 2017-11-01 DIAGNOSIS — H40013 Open angle with borderline findings, low risk, bilateral: Secondary | ICD-10-CM | POA: Diagnosis not present

## 2017-11-04 ENCOUNTER — Encounter (INDEPENDENT_AMBULATORY_CARE_PROVIDER_SITE_OTHER): Payer: Self-pay | Admitting: Physical Medicine and Rehabilitation

## 2017-11-04 NOTE — Progress Notes (Signed)
[name] - 83 y.o. female MRN [id number]  Date of birth: 1931/01/25  Office Visit Note: Visit Date: 10/25/2017 PCP: Lauree Chandler, NP Referred by: Lauree Chandler, NP  Subjective: Chief Complaint  Patient presents with  . Lower Back - Pain  . Right Hip - Pain  . Right Leg - Pain   HPI: [name] is a very pleasant 83 year old female who is present today with her son who provides some of the history.  She is followed by Dr. Ninfa Linden and Benita Stabile in our office for orthopedic care.  The last time I saw [name] was in October 2016.  She was having right radicular leg pain to the ankle in a fairly classic L5 distribution.  MRI from 2016 which is again reviewed below showed grade 1 listhesis with severe facet arthritis and a moderately severe central canal stenosis at L5-L6.  The MRI number the lumbar spine such that there was a fully lumbarized S1 segment that they labeled L6.  Interestingly we completed L5 injection based on that numbering scheme without much relief.  We went on to complete an interlaminar injection at what they refer to as L6-S1 and again had no real relief of her symptoms.  Ultimately we did complete 1/3 injection which was a right essentially L6 transforaminal epidural steroid injection based on this numbering scheme and she had great resolution of symptoms up until just recently.  She reports several months of worsening right radicular pain.  She was having a lot of right hip pain and saw Benita Stabile who recommended repeat epidural injection.  She is getting buttock pain that radiates down the right leg to the ankle and again a classic L5 distribution that does seem very similar to what she had in the past.  She has had no trauma or falls.  She has had no focal weakness.  She is tried medications at this point without much relief.  Since we have seen her she has had a complicating factor where she has had a pacemaker placed and is taking Eliquis.  At first she  reports to [place] that she was told by her cardiologists that she could no longer have any more epidurals because of her pacemaker.  We spent roughly 20 minutes today talking with her and her son about anticoagulation spinal procedures as well as statistics and probability of adverse events.  She was told by Dr. Martinique that she could hold the Eliquis for 3 days for the injection but is still very concerned about this.  She does not want to have a stroke can be a burden to her family.  She cannot take anti-inflammatories and has not really been able to take anti-inflammatories even before the anticoagulation.    Review of Systems  Constitutional: Negative for chills, fever, malaise/fatigue and weight loss.  HENT: Negative for hearing loss and sinus pain.   Eyes: Negative for blurred vision, double vision and photophobia.  Respiratory: Negative for cough and shortness of breath.   Cardiovascular: Negative for chest pain, palpitations and leg swelling.  Gastrointestinal: Negative for abdominal pain, nausea and vomiting.  Genitourinary: Negative for flank pain.  Musculoskeletal: Positive for back pain. Negative for myalgias.       Right hip and leg pain  Skin: Negative for itching and rash.  Neurological: Positive for tingling. Negative for tremors, focal weakness and weakness.  Endo/Heme/Allergies: Negative.   Psychiatric/Behavioral: Negative for depression.  All other systems reviewed and are negative.  Otherwise  per HPI.  Assessment & Plan: Visit Diagnoses:  1. Lumbar radiculopathy     Plan: Findings:  Patient with lumbar spondylolisthesis grade 1 fairly severe stenosis multifactorial at L L5-L6 based on prior numbering scheme of the MRI from 2016.  Prior L6 transforaminal epidural steroid injection gave her the most relief.  She had relief for over a year.  She is now on Eliquis and has a pacemaker.  We had a rather lengthy discussion on this today.  We spoke for more than 20 minutes.  I did  finally convince her that the pacemaker was a non-issue other than MRIs.  If she is still very concerned about the epidural type injection and whether she should stop the Eliquis or not.  Dr. Martinique has said it is okay to stop it for 3 days I did make a comment that we might be able to do it with a blood thinner still in place.  He is in fact right some of the new standards coming out will eventually show that we can do transforaminal injections still on anticoagulated patient.  We do in point of fact do transforaminal type injections which is what this would be on anticoagulation specifically if the patient cannot come off of the anticoagulation.  This usually happens with patients who have had a stent placed serendipitously geta radicular back pain.  I tried to convince her that she is really in a good position that we could do the injection either with her anticoagulated and I talked about the risk of that, including spinal hematoma and reduced risk from a transforaminal approach.  Or she can come off the anticoagulation for 3 days and we talked about the risk of that, increased chance of stroke but this is minimal usually with atrial fibrillation.  She wants again wants to talk to Dr. Martinique before she makes this decision.  I think either decision has minimal risk to the patient and I think we could get her some relief of her hip and leg pain.  Alternative treatments would be prednisone, gabapentin or other medication but I do think the injection has helped her in the past and is definitely the way to go.    Meds & Orders: No orders of the defined types were placed in this encounter.  No orders of the defined types were placed in this encounter.   Follow-up: Return for Right L6 transforaminal epidural steroid injection.   Procedures: No procedures performed  No notes on file   Clinical History: LUMBAR SPINE MRI FINDINGS:  06/03/2015   . The conus medullaris appears normal, terminating at the L1  level. No abnormal signal demonstrated within the visualized distal spinal cord. Cauda equina unremarkable.  . Vertebral body heights maintained. Marrow signal within normal limits for age.Alignment anatomic.  . The visualized lower thoracic spine demonstrates no disc herniation, significant central canal or foraminal stenosis.  . T12-L1: No disc herniation, significant central canal or foraminal stenosis. Marland Kitchen L1-L2: Small right paracentral disc extrusion seen only on sagittal images. No definite stenosis . L2-L3: No disc herniation, significant central canal or foraminal stenosis. Marland Kitchen L3-L4: Small inferiorly migrating central and right paracentral disc extrusion with potential encroachment of the right descending L4 nerve root . L4-L5: No disc herniation, significant central canal or foraminal stenosis. Marland Kitchen L5-L6: Grade 1 anterolisthesis. Severe facet arthrosis and ligament flavum thickening. Trefoil configuration of the central canal consistent with moderately severe central canal stenosis . L6-S2: No disc herniation, significant central canal or foraminal  stenosis.  . Soft tissues are grossly unremarkable   IMPRESSION: 6 lumbar type vertebral bodies, lowest labeled L6 for the purposes of this report.  Grade 1 anterolisthesis of L5 on L6. Severe facet arthrosis and ligament flavum thickening. Trefoil configuration of the central canal consistent with moderately severe central canal stenosis  Small inferiorly migrating central and right paracentral disc extrusion at L3-4 with potential encroachment of the right descending L4 nerve root  Small right paracentral disc extrusion at L1-2 seen only on sagittal images. No definite stenosis  She reports that  has never smoked. she has never used smokeless tobacco.  Recent Labs    08/06/17 0918  HGBA1C 5.2    Objective:  VS:  HT:    WT:   BMI:     BP:(!) 170/86  HR:62bpm  TEMP: ( )  RESP:  Physical Exam  Constitutional: She is oriented to  person, place, and time. She appears well-developed and well-nourished. No distress.  HENT:  Head: Normocephalic and atraumatic.  Nose: Nose normal.  Mouth/Throat: Oropharynx is clear and moist.  Eyes: Conjunctivae are normal. Pupils are equal, round, and reactive to light.  Neck: Normal range of motion. Neck supple.  Cardiovascular: Regular rhythm and intact distal pulses.  Pulmonary/Chest: Effort normal. No respiratory distress.  Abdominal: She exhibits no distension. There is no guarding.  Musculoskeletal:  Patient is slow to rise from a seated position has concordant low back pain with extension rotation particularly to the right.  She has no pain with hip rotation.  She has good distal strength without clonus.  She has 2+ muscle stretch reflexes at the quadriceps and gastrocnemius.  She has 5 out of 5 strength with EHL dorsiflexion and plantar flexion.  Neurological: She is alert and oriented to person, place, and time. She exhibits normal muscle tone. Coordination normal.  Skin: Skin is warm. No rash noted. No erythema.  Psychiatric: She has a normal mood and affect. Her behavior is normal.  Nursing note and vitals reviewed.   Ortho Exam Imaging: No results found.  Past Medical/Family/Surgical/Social History: Medications & Allergies reviewed per EMR Patient Active Problem List   Diagnosis Date Noted  . Trochanteric bursitis, right hip 05/16/2017  . Cardiac pacemaker in situ 06/21/2016  . Atypical atrial flutter ([place])   . Chest pain 04/10/2016  . Atrial fibrillation ([place]) 03/29/2016  . Hyperglycemia 02/17/2015  . Obese 02/17/2015  . Insomnia 01/27/2014  . 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 Knee joint replacement by other means 10/01/2013  . Arthritis of knee, right 07/04/2013  . Osteoarthritis of right knee 07/01/2013  . Cervicalgia 07/01/2013  . Bradycardia 05/13/2013  . HYPERCHOLESTEROLEMIA 06/02/2009  . ANXIETY 06/02/2009  . Essential hypertension 06/02/2009  .  ESOPHAGEAL STRICTURE 06/02/2009  . GERD 06/02/2009  . HIATAL HERNIA 06/02/2009  . DIVERTICULOSIS, COLON 06/02/2009  . Dysphagia, pharyngoesophageal phase 06/02/2009  . Backache 05/01/2007  . LEG PAIN, RIGHT 05/01/2007   Past Medical History:  Diagnosis Date  . Allergic rhinitis due to pollen   . Anxiety state, unspecified   . Arthritis    "right knee" (06/21/2016)  . Asymptomatic varicose veins   . Carpal tunnel syndrome   . Chest pain, unspecified   . Chronic lower back pain    "on the left side" (06/21/2016)  . Cramp of limb   . Depressive disorder, not elsewhere classified   . Diaphragmatic hernia without mention of obstruction or gangrene   . Diaphragmatic hernia without mention of obstruction or gangrene   .  Disturbance of skin sensation   . Diverticulosis of colon (without mention of hemorrhage)   . Enthesopathy of hip region   . Esophageal reflux   . Hepatitis, unspecified   . History of blood transfusion    "w/both knee replacements"  . History of duodenal ulcer   . Insomnia, unspecified   . Lumbago   . Migraine    "none since ~ 1990" (06/21/2016)  . Myalgia and myositis, unspecified   . Obesity, unspecified   . Other abnormal blood chemistry   . Other dysphagia   . Other malaise and fatigue   . Other nonspecific abnormal serum enzyme levels   . Other specified cardiac dysrhythmias(427.89)   . Pacemaker   . Pain in joint, ankle and foot   . Pain in joint, hand   . Pain in joint, lower leg   . Pain in joint, pelvic region and thigh   . Pain in limb   . Plantar fascial fibromatosis   . Presence of permanent cardiac pacemaker   . Reflux esophagitis   . Sciatica   . Spinal stenosis, unspecified region other than cervical   . Stricture and stenosis of esophagus   . Symptomatic menopausal or female climacteric states   . Unspecified essential hypertension   . Unspecified essential hypertension   . Unspecified vitamin D deficiency    Family History  Problem  Relation Age of Onset  . Ovarian cancer Mother   . Uterine cancer Mother   . Cerebrovascular Accident Mother   . Heart disease Father   . Hypertension Brother   . Obesity Daughter    Past Surgical History:  Procedure Laterality Date  . BREAST BIOPSY Left 1990s X 2  . CATARACT EXTRACTION W/ INTRAOCULAR LENS IMPLANT Left 08/03/1999   DR EPES   . CATARACT EXTRACTION W/ INTRAOCULAR LENS IMPLANT Right 2000   DR EPES  . [place]  . COLONOSCOPY  1988   Normal   . DENTAL SURGERY Left 08/2016  . ESOPHAGOGASTRODUODENOSCOPY (EGD) WITH ESOPHAGEAL DILATION  ~ 1982   Dr. Sharlett Iles  . EXCISION OF ACTINIC KERATOSIS     DR LUPTON   . EYE SURGERY    . INSERT / REPLACE / REMOVE PACEMAKER    . JOINT REPLACEMENT    . KNEE ARTHROSCOPY Left 2003  . KNEE ARTHROSCOPY Right 06-26-13  . LASER FOR CLOUDY CAP LEFT EYE Left 03/2006   DR DIGBY  . TOTAL KNEE ARTHROPLASTY Left 04/2004   DR RENDALL  . VAGINAL HYSTERECTOMY  1979   Social History   Occupational History  . Not on file  Tobacco Use  . Smoking status: Never Smoker  . Smokeless tobacco: Never Used  Substance and Sexual Activity  . Alcohol use: No  . Drug use: No  . Sexual activity: Yes

## 2017-11-06 ENCOUNTER — Other Ambulatory Visit: Payer: Self-pay | Admitting: Internal Medicine

## 2017-11-06 DIAGNOSIS — G4700 Insomnia, unspecified: Secondary | ICD-10-CM

## 2017-11-21 ENCOUNTER — Ambulatory Visit (INDEPENDENT_AMBULATORY_CARE_PROVIDER_SITE_OTHER): Payer: Medicare Other

## 2017-11-21 VITALS — BP 120/78 | HR 62 | Temp 97.9°F | Ht 62.0 in | Wt 185.0 lb

## 2017-11-21 DIAGNOSIS — Z0000 Encounter for general adult medical examination without abnormal findings: Secondary | ICD-10-CM

## 2017-11-21 MED ORDER — FUROSEMIDE 20 MG PO TABS: 20.0000 mg | ORAL_TABLET | ORAL | 3 refills | Status: DC | PRN

## 2017-11-21 NOTE — Patient Instructions (Addendum)
[name] , Thank you for taking time to come for your Medicare Wellness Visit. I appreciate your ongoing commitment to your health goals. Please review the following plan we discussed and let me know if I can assist you in the future.   Screening recommendations/referrals: Colonoscopy excluded, you are over age 81 Mammogram excluded, you are over age 78 Bone Density up to date Recommended yearly ophthalmology/optometry visit for glaucoma screening and checkup Recommended yearly dental visit for hygiene and checkup  Vaccinations: Influenza vaccine up to date. Due 2019 fall season Pneumococcal vaccine up to date Tdap vaccine up to date. Due 06/25/2027 Shingles vaccine up to date. Waiting for second shot    Advanced directives: In Chart  Conditions/risks identified: None  Next appointment: [name], NP 12/13/2017 @ 1pm    [name], RN 11/29/2018 @ 3:15pm   Preventive Care 65 Years and Older, Female Preventive care refers to lifestyle choices and visits with your health care provider that can promote health and wellness. What does preventive care include?  A yearly physical exam. This is also called an annual well check.  Dental exams once or twice a year.  Routine eye exams. Ask your health care provider how often you should have your eyes checked.  Personal lifestyle choices, including:  Daily care of your teeth and gums.  Regular physical activity.  Eating a healthy diet.  Avoiding tobacco and drug use.  Limiting alcohol use.  Practicing safe sex.  Taking low-dose aspirin every day.  Taking vitamin and mineral supplements as recommended by your health care provider. What happens during an annual well check? The services and screenings done by your health care provider during your annual well check will depend on your age, overall health, lifestyle risk factors, and family history of disease. Counseling  Your health care provider may ask you questions  about your:  Alcohol use.  Tobacco use.  Drug use.  Emotional well-being.  Home and relationship well-being.  Sexual activity.  Eating habits.  History of falls.  Memory and ability to understand (cognition).  Work and work Statistician.  Reproductive health. Screening  You may have the following tests or measurements:  Height, weight, and BMI.  Blood pressure.  Lipid and cholesterol levels. These may be checked every 5 years, or more frequently if you are over 38 years old.  Skin check.  Lung cancer screening. You may have this screening every year starting at age 36 if you have a 30-pack-year history of smoking and currently smoke or have quit within the past 15 years.  Fecal occult blood test (FOBT) of the stool. You may have this test every year starting at age 84.  Flexible sigmoidoscopy or colonoscopy. You may have a sigmoidoscopy every 5 years or a colonoscopy every 10 years starting at age 79.  Hepatitis C blood test.  Hepatitis B blood test.  Sexually transmitted disease (STD) testing.  Diabetes screening. This is done by checking your blood sugar (glucose) after you have not eaten for a while (fasting). You may have this done every 1-3 years.  Bone density scan. This is done to screen for osteoporosis. You may have this done starting at age 30.  Mammogram. This may be done every 1-2 years. Talk to your health care provider about how often you should have regular mammograms. Talk with your health care provider about your test results, treatment options, and if necessary, the need for more tests. Vaccines  Your health care provider may recommend certain vaccines,  such as:  Influenza vaccine. This is recommended every year.  Tetanus, diphtheria, and acellular pertussis (Tdap, Td) vaccine. You may need a Td booster every 10 years.  Zoster vaccine. You may need this after age 66.  Pneumococcal 13-valent conjugate (PCV13) vaccine. One dose is  recommended after age 28.  Pneumococcal polysaccharide (PPSV23) vaccine. One dose is recommended after age 72. Talk to your health care provider about which screenings and vaccines you need and how often you need them. This information is not intended to replace advice given to you by your health care provider. Make sure you discuss any questions you have with your health care provider. Document Released: 01/07/2016 Document Revised: 08/30/2016 Document Reviewed: 10/12/2015 Elsevier Interactive Patient Education  2017 [place] Prevention in the Home Falls can cause injuries. They can happen to people of all ages. There are many things you can do to make your home safe and to help prevent falls. What can I do on the outside of my home?  Regularly fix the edges of walkways and driveways and fix any cracks.  Remove anything that might make you trip as you walk through a door, such as a raised step or threshold.  Trim any bushes or trees on the path to your home.  Use bright outdoor lighting.  Clear any walking paths of anything that might make someone trip, such as rocks or tools.  Regularly check to see if handrails are loose or broken. Make sure that both sides of any steps have handrails.  Any raised decks and porches should have guardrails on the edges.  Have any leaves, snow, or ice cleared regularly.  Use sand or salt on walking paths during winter.  Clean up any spills in your garage right away. This includes oil or grease spills. What can I do in the bathroom?  Use night lights.  Install grab bars by the toilet and in the tub and shower. Do not use towel bars as grab bars.  Use non-skid mats or decals in the tub or shower.  If you need to sit down in the shower, use a plastic, non-slip stool.  Keep the floor dry. Clean up any water that spills on the floor as soon as it happens.  Remove soap buildup in the tub or shower regularly.  Attach bath mats  securely with double-sided non-slip rug tape.  Do not have throw rugs and other things on the floor that can make you trip. What can I do in the bedroom?  Use night lights.  Make sure that you have a light by your bed that is easy to reach.  Do not use any sheets or blankets that are too big for your bed. They should not hang down onto the floor.  Have a firm chair that has side arms. You can use this for support while you get dressed.  Do not have throw rugs and other things on the floor that can make you trip. What can I do in the kitchen?  Clean up any spills right away.  Avoid walking on wet floors.  Keep items that you use a lot in easy-to-reach places.  If you need to reach something above you, use a strong step stool that has a grab bar.  Keep electrical cords out of the way.  Do not use floor polish or wax that makes floors slippery. If you must use wax, use non-skid floor wax.  Do not have throw rugs and other things on  the floor that can make you trip. What can I do with my stairs?  Do not leave any items on the stairs.  Make sure that there are handrails on both sides of the stairs and use them. Fix handrails that are broken or loose. Make sure that handrails are as long as the stairways.  Check any carpeting to make sure that it is firmly attached to the stairs. Fix any carpet that is loose or worn.  Avoid having throw rugs at the top or bottom of the stairs. If you do have throw rugs, attach them to the floor with carpet tape.  Make sure that you have a light switch at the top of the stairs and the bottom of the stairs. If you do not have them, ask someone to add them for you. What else can I do to help prevent falls?  Wear shoes that:  Do not have high heels.  Have rubber bottoms.  Are comfortable and fit you well.  Are closed at the toe. Do not wear sandals.  If you use a stepladder:  Make sure that it is fully opened. Do not climb a closed  stepladder.  Make sure that both sides of the stepladder are locked into place.  Ask someone to hold it for you, if possible.  Clearly mark and make sure that you can see:  Any grab bars or handrails.  First and last steps.  Where the edge of each step is.  Use tools that help you move around (mobility aids) if they are needed. These include:  Canes.  Walkers.  Scooters.  Crutches.  Turn on the lights when you go into a dark area. Replace any light bulbs as soon as they burn out.  Set up your furniture so you have a clear path. Avoid moving your furniture around.  If any of your floors are uneven, fix them.  If there are any pets around you, be aware of where they are.  Review your medicines with your doctor. Some medicines can make you feel dizzy. This can increase your chance of falling. Ask your doctor what other things that you can do to help prevent falls. This information is not intended to replace advice given to you by your health care provider. Make sure you discuss any questions you have with your health care provider. Document Released: 10/07/2009 Document Revised: 05/18/2016 Document Reviewed: 01/15/2015 Elsevier Interactive Patient Education  2017 [employer].

## 2017-11-21 NOTE — Progress Notes (Signed)
Subjective:   [name] is a 83 y.o. female who presents for Medicare Annual (Subsequent) preventive examination.  Last AWV-12/12/2016    Objective:     Vitals: BP 120/78 (BP Location: Right Arm, Patient Position: Sitting)   Pulse 62   Temp 97.9 F (36.6 C) (Oral)   Ht 5\' 2"  (1.575 m)   Wt 185 lb (83.9 kg)   SpO2 95%   BMI 33.84 kg/m   Body mass index is 33.84 kg/m.   Tobacco Social History   Tobacco Use  Smoking Status Never Smoker  Smokeless Tobacco Never Used     Counseling given: Not Answered   Past Medical History:  Diagnosis Date  . Allergic rhinitis due to pollen   . Anxiety state, unspecified   . Arthritis    "right knee" (06/21/2016)  . Asymptomatic varicose veins   . Carpal tunnel syndrome   . Chest pain, unspecified   . Chronic lower back pain    "on the left side" (06/21/2016)  . Cramp of limb   . Depressive disorder, not elsewhere classified   . Diaphragmatic hernia without mention of obstruction or gangrene   . Diaphragmatic hernia without mention of obstruction or gangrene   . Disturbance of skin sensation   . Diverticulosis of colon (without mention of hemorrhage)   . Enthesopathy of hip region   . Esophageal reflux   . Hepatitis, unspecified   . History of blood transfusion    "w/both knee replacements"  . History of duodenal ulcer   . Insomnia, unspecified   . Lumbago   . Migraine    "none since ~ 1990" (06/21/2016)  . Myalgia and myositis, unspecified   . Obesity, unspecified   . Other abnormal blood chemistry   . Other dysphagia   . Other malaise and fatigue   . Other nonspecific abnormal serum enzyme levels   . Other specified cardiac dysrhythmias(427.89)   . Pacemaker   . Pain in joint, ankle and foot   . Pain in joint, hand   . Pain in joint, lower leg   . Pain in joint, pelvic region and thigh   . Pain in limb   . Plantar fascial fibromatosis   . Presence of permanent cardiac pacemaker   . Reflux esophagitis   .  Sciatica   . Spinal stenosis, unspecified region other than cervical   . Stricture and stenosis of esophagus   . Symptomatic menopausal or female climacteric states   . Unspecified essential hypertension   . Unspecified essential hypertension   . Unspecified vitamin D deficiency    Past Surgical History:  Procedure Laterality Date  . BREAST BIOPSY Left 1990s X 2  . CARDIOVERSION N/A 04/26/2016   Procedure: CARDIOVERSION;  Surgeon: Pixie Casino, MD;  Location: [place];  Service: Cardiovascular;  Laterality: N/A;  . CATARACT EXTRACTION W/ INTRAOCULAR LENS IMPLANT Left 08/03/1999   DR EPES   . CATARACT EXTRACTION W/ INTRAOCULAR LENS IMPLANT Right 2000   DR EPES  . [place]  . COLONOSCOPY  1988   Normal   . DENTAL SURGERY Left 08/2016  . EP IMPLANTABLE DEVICE N/A 06/21/2016   Procedure: Pacemaker Implant;  Surgeon: Evans Lance, MD;  Location: [facility];  Service: Cardiovascular;  Laterality: N/A;  . ESOPHAGOGASTRODUODENOSCOPY (EGD) WITH ESOPHAGEAL DILATION  ~ 1982   Dr. Sharlett Iles  . EXCISION OF ACTINIC KERATOSIS     DR LUPTON   . EYE  SURGERY    . INSERT / REPLACE / REMOVE PACEMAKER    . JOINT REPLACEMENT    . KNEE ARTHROSCOPY Left 2003  . KNEE ARTHROSCOPY Right 06-26-13  . KNEE CLOSED REDUCTION Right 10/23/2013   Procedure: CLOSED MANIPULATION RIGHT KNEE;  Surgeon: Mcarthur Rossetti, MD;  Location: [place];  Service: Orthopedics;  Laterality: Right;  . LASER FOR CLOUDY CAP LEFT EYE Left 03/2006   DR DIGBY  . TOTAL KNEE ARTHROPLASTY Left 04/2004   DR RENDALL  . TOTAL KNEE ARTHROPLASTY Right 07/04/2013   Procedure: RIGHT TOTAL KNEE ARTHROPLASTY;  Surgeon: Mcarthur Rossetti, MD;  Location: [facility];  Service: Orthopedics;  Laterality: Right;  Marland Kitchen VAGINAL HYSTERECTOMY  1979   Family History  Problem Relation Age of Onset  . Ovarian cancer Mother   . Uterine cancer Mother   . Cerebrovascular Accident Mother   . Heart disease  Father   . Hypertension Brother   . Obesity Daughter    Social History   Substance and Sexual Activity  Sexual Activity Yes    Outpatient Encounter Medications as of 11/21/2017  Medication Sig  . acetaminophen (TYLENOL) 500 MG tablet Take 500 mg by mouth every 6 (six) hours as needed for mild pain.   Marland Kitchen ALPRAZolam (XANAX) 0.25 MG tablet Take one tablet by mouth at bedtime as needed for rest or anxiety  . antiseptic oral rinse (BIOTENE) LIQD 15 mLs by Mouth Rinse route daily as needed for dry mouth.  Marland Kitchen apixaban (ELIQUIS) 5 MG TABS tablet Take one tablet by mouth twice daily for anticoagulation  . bimatoprost (LUMIGAN) 0.01 % SOLN Instill 1 drop into both eyes once daily in the evening  . BIOTIN 5000 PO Take 1 capsule by mouth daily.  . Cholecalciferol (VITAMIN D) 2000 UNITS tablet Take 2,000 Units by mouth daily.  . dorzolamide (TRUSOPT) 2 % ophthalmic solution Place 1 drop into both eyes 3 (three) times daily.   Marland Kitchen esomeprazole (NEXIUM) 20 MG capsule Take 1 capsule (20 mg total) by mouth daily.  . furosemide (LASIX) 20 MG tablet Take 20 mg by mouth as needed for edema.   Marland Kitchen KETOPROFEN EX Apply 1 application topically daily as needed (pain). 20% cream; PRN for pain.  . methylcellulose (ARTIFICIAL TEARS) 1 % ophthalmic solution Place 1 drop into both eyes daily as needed (for dry eyes).   . metoprolol tartrate (LOPRESSOR) 50 MG tablet TAKE 1 TABLET BY MOUTH TWICE A DAY  . propafenone (RYTHMOL SR) 225 MG 12 hr capsule Take 1 capsule (225 mg total) by mouth 2 (two) times daily.  . traZODone (DESYREL) 50 MG tablet TAKE 1 TABLET BY MOUTH EVERY NIGHT TO HELP WITH SLEEP  . valsartan-hydrochlorothiazide (DIOVAN-HCT) 320-25 MG tablet TAKE 1 TABLET BY MOUTH EVERY DAY   No facility-administered encounter medications on file as of 11/21/2017.     Activities of Daily Living In your present state of health, do you have any difficulty performing the following activities: 11/21/2017 12/12/2016    Hearing? N N  Vision? N Y  Comment - Hx of glaucoma   Difficulty concentrating or making decisions? N N  Walking or climbing stairs? N Y  Comment - bilateral knee replacements; holds railing  Dressing or bathing? N N  Doing errands, shopping? N N  Comment - Pt still drives vehicle.   Preparing Food and eating ? N N  Using the Toilet? N N  In the past six months, have you accidently leaked urine? Tempie Donning  Comment -  Pt wears cloth in underwear when she goes out and about.   Do you have problems with loss of bowel control? N N  Managing your Medications? N N  Managing your Finances? N N  Housekeeping or managing your Housekeeping? N N  Some recent data might be hidden    Patient Care Team: Lauree Chandler, NP as PCP - General (Geriatric Medicine) Mcarthur Rossetti, MD as Attending Physician (Orthopedic Surgery) Larey Dresser, MD as Attending Physician (Cardiology) Thornell Sartorius, MD as Consulting Physician (Otolaryngology) Druscilla Brownie, MD as Consulting Physician (Dermatology) Calvert Cantor, MD as Consulting Physician (Ophthalmology) Pixie Casino, MD as Consulting Physician (Cardiology)    Assessment:     Exercise Activities and Dietary recommendations Current Exercise Habits: Home exercise routine, Type of exercise: stretching;strength training/weights, Time (Minutes): 30, Frequency (Times/Week): 5, Weekly Exercise (Minutes/Week): 150, Intensity: Moderate, Exercise limited by: None identified  Goals    . DIET - INCREASE WATER INTAKE     Patient will increase water by 1-2 cups      Fall Risk Fall Risk  11/21/2017 08/09/2017 06/26/2017 12/12/2016 07/17/2016  Falls in the past year? Yes Yes Yes Yes No  Number falls in past yr: 2 or more 2 or more 2 or more 2 or more -  Comment - - - Tripped over a garden hose and left leg weakness -  Injury with Fall? Yes Yes Yes No -  Comment - - skin tears and sore ribs - -  Risk Factor Category  - - - High Fall Risk -   Risk for fall due to : - - - Impaired balance/gait;History of fall(s) -  Follow up - - - Falls prevention discussed -  Comment - - - Pt is working out at a gym, practicing her balance -   Depression Screen PHQ 2/9 Scores 11/21/2017 12/12/2016 05/30/2016 11/30/2015  PHQ - 2 Score 0 0 0 0     Cognitive Function MMSE - Mini Mental State Exam 11/21/2017 12/12/2016 11/30/2015  Orientation to time 5 5 5   Orientation to Place 5 5 5   Registration 3 3 3   Attention/ Calculation 5 5 5   Recall 3 3 3   Language- name 2 objects 2 2 2   Language- repeat 1 1 1   Language- follow 3 step command 3 3 3   Language- read & follow direction 1 1 1   Write a sentence 1 1 1   Copy design 1 1 1   Total score 30 30 30         Immunization History  Administered Date(s) Administered  . Influenza, High Dose Seasonal PF 09/14/2017  . Influenza,inj,Quad PF,6+ Mos 09/23/2013, 09/25/2014, 10/11/2015, 08/30/2016  . Pneumococcal Conjugate-13 02/17/2015  . Pneumococcal Polysaccharide-23 12/12/2016  . Tdap 06/24/2017  . Zoster 01/29/2008  . Zoster Recombinat (Shingrix) 09/04/2017   Screening Tests Health Maintenance  Topic Date Due  . MAMMOGRAM  03/29/2018  . TETANUS/TDAP  06/25/2027  . INFLUENZA VACCINE  Completed  . DEXA SCAN  Completed  . PNA vac Low Risk Adult  Completed      Plan:     I have personally reviewed and addressed the Medicare Annual Wellness questionnaire and have noted the following in the patient's chart:  A. Medical and social history B. Use of alcohol, tobacco or illicit drugs  C. Current medications and supplements D. Functional ability and status E.  Nutritional status F.  Physical activity G. Advance directives H. List of other physicians I.  Hospitalizations, surgeries, and ER visits  in previous 12 months J.  [place] to include hearing, vision, cognitive, depression L. Referrals and appointments - none  In addition, I have reviewed and discussed with patient  certain preventive protocols, quality metrics, and best practice recommendations. A written personalized care plan for preventive services as well as general preventive health recommendations were provided to patient.  See attached scanned questionnaire for additional information.   Signed,   Rich Reining, RN Nurse Health Advisor   Quick Notes   Health Maintenance: Waiting for second Shingrix shot. Refilled Lasix     Abnormal Screen: MMSE 30/30. Passed clock drawing     Patient Concerns: None     Nurse Concerns: Education on Kegel exercises for incontinence and deep breathing practices to decrease stress.

## 2017-11-21 NOTE — Addendum Note (Signed)
Addended by: Rich Reining E on: 11/21/2017 10:32 AM   Modules accepted: Orders

## 2017-11-23 ENCOUNTER — Other Ambulatory Visit: Payer: Self-pay | Admitting: Nurse Practitioner

## 2017-11-23 DIAGNOSIS — K219 Gastro-esophageal reflux disease without esophagitis: Secondary | ICD-10-CM

## 2017-11-29 ENCOUNTER — Ambulatory Visit: Payer: Medicare Other | Admitting: Internal Medicine

## 2017-12-13 ENCOUNTER — Ambulatory Visit: Payer: Medicare Other | Admitting: Nurse Practitioner

## 2017-12-24 ENCOUNTER — Ambulatory Visit (INDEPENDENT_AMBULATORY_CARE_PROVIDER_SITE_OTHER): Payer: Medicare Other | Admitting: *Deleted

## 2017-12-24 DIAGNOSIS — I484 Atypical atrial flutter: Secondary | ICD-10-CM | POA: Diagnosis not present

## 2017-12-24 NOTE — Progress Notes (Signed)
Remote pacemaker transmission.   

## 2017-12-26 DIAGNOSIS — L570 Actinic keratosis: Secondary | ICD-10-CM | POA: Diagnosis not present

## 2017-12-26 DIAGNOSIS — L814 Other melanin hyperpigmentation: Secondary | ICD-10-CM | POA: Diagnosis not present

## 2017-12-27 LAB — CUP PACEART REMOTE DEVICE CHECK
Battery Remaining Longevity: 119 mo
Battery Remaining Percentage: 95.5 %
Battery Voltage: 2.99 V
Brady Statistic AS VS Percent: 26 %
Date Time Interrogation Session: 20181231164631
Implantable Lead Implant Date: 20170628
Implantable Pulse Generator Implant Date: 20170628
Lead Channel Pacing Threshold Amplitude: 0.5 V
Lead Channel Pacing Threshold Pulse Width: 0.4 ms
Lead Channel Sensing Intrinsic Amplitude: 1.9 mV
Lead Channel Setting Pacing Amplitude: 2.5 V
MDC IDC LEAD IMPLANT DT: 20170628
MDC IDC LEAD LOCATION: 753859
MDC IDC LEAD LOCATION: 753860
MDC IDC MSMT LEADCHNL RA IMPEDANCE VALUE: 480 Ohm
MDC IDC MSMT LEADCHNL RA PACING THRESHOLD PULSEWIDTH: 0.4 ms
MDC IDC MSMT LEADCHNL RV IMPEDANCE VALUE: 490 Ohm
MDC IDC MSMT LEADCHNL RV PACING THRESHOLD AMPLITUDE: 1 V
MDC IDC MSMT LEADCHNL RV SENSING INTR AMPL: 4 mV
MDC IDC SET LEADCHNL RA PACING AMPLITUDE: 2 V
MDC IDC SET LEADCHNL RV PACING PULSEWIDTH: 0.4 ms
MDC IDC SET LEADCHNL RV SENSING SENSITIVITY: 2 mV
MDC IDC STAT BRADY AP VP PERCENT: 1 %
MDC IDC STAT BRADY AP VS PERCENT: 70 %
MDC IDC STAT BRADY AS VP PERCENT: 3.2 %
MDC IDC STAT BRADY RA PERCENT PACED: 70 %
MDC IDC STAT BRADY RV PERCENT PACED: 3.5 %
Pulse Gen Model: 2272
Pulse Gen Serial Number: 7910313

## 2017-12-28 ENCOUNTER — Encounter: Payer: Self-pay | Admitting: Cardiology

## 2017-12-31 ENCOUNTER — Ambulatory Visit: Payer: Self-pay | Admitting: Nurse Practitioner

## 2018-01-02 ENCOUNTER — Encounter: Payer: Self-pay | Admitting: Nurse Practitioner

## 2018-01-02 ENCOUNTER — Ambulatory Visit: Payer: Medicare Other | Admitting: Nurse Practitioner

## 2018-01-02 VITALS — BP 122/78 | HR 63 | Temp 98.1°F | Resp 17 | Ht 62.0 in | Wt 187.0 lb

## 2018-01-02 DIAGNOSIS — M48062 Spinal stenosis, lumbar region with neurogenic claudication: Secondary | ICD-10-CM | POA: Diagnosis not present

## 2018-01-02 DIAGNOSIS — I480 Paroxysmal atrial fibrillation: Secondary | ICD-10-CM

## 2018-01-02 DIAGNOSIS — G4700 Insomnia, unspecified: Secondary | ICD-10-CM | POA: Diagnosis not present

## 2018-01-02 DIAGNOSIS — I10 Essential (primary) hypertension: Secondary | ICD-10-CM | POA: Diagnosis not present

## 2018-01-02 DIAGNOSIS — E785 Hyperlipidemia, unspecified: Secondary | ICD-10-CM

## 2018-01-02 DIAGNOSIS — K219 Gastro-esophageal reflux disease without esophagitis: Secondary | ICD-10-CM | POA: Diagnosis not present

## 2018-01-02 NOTE — Progress Notes (Signed)
Careteam: Patient Care Team: Lauree Chandler, NP as PCP - General (Geriatric Medicine) Mcarthur Rossetti, MD as Attending Physician (Orthopedic Surgery) Larey Dresser, MD as Attending Physician (Cardiology) Thornell Sartorius, MD as Consulting Physician (Otolaryngology) Druscilla Brownie, MD as Consulting Physician (Dermatology) Calvert Cantor, MD as Consulting Physician (Ophthalmology) Debara Pickett Nadean Corwin, MD as Consulting Physician (Cardiology)   Allergies  Allergen Reactions  . Advil [Ibuprofen]     GI upset  . Aleve [Naproxen Sodium]     GI upset  . Aspirin Nausea And Vomiting    Takes baby aspirin qod without problems  . Atorvastatin     Leg cramps  . Celebrex [Celecoxib]     GI upset  . Diclofenac     GI upset  . Doxycycline     Headache  . Metoclopramide Hcl     REACTION: heart palps  . Nsaids Other (See Comments)    Tears my stomach up   . Other     Antihistamine - increase BP  . Pravastatin     Leg cramps  . Timolol     Dropped HR    Chief Complaint  Patient presents with  . Medical Management of Chronic Issues    Pt is being seen for a 4 month routine visit. Pt would like to discuss the pros/cons of having steroid injections in back.   . Depression    score of 0     HPI: Patient is a 84 y.o. female seen in the office today for routine follow up.  Gained over the holidays, trying to get back on her diet.  Trying to get back into increasing exercising. Orthopedics offered back injection but getting by and pain is controlled on tylenol  Insomnia- using trazodone for sleep.  GERD- good controlled on nexium HTN- controlled on current regimen pafib- no palpitations at this time. Routine checks with cardiologist   Review of Systems:  Review of Systems  Constitutional: Negative for chills, fever and weight loss.       Fatigue   HENT: Negative for tinnitus.   Respiratory: Negative for cough, sputum production and shortness of breath.     Cardiovascular: Negative for chest pain, palpitations and leg swelling.  Gastrointestinal: Positive for heartburn (diet controlled). Negative for abdominal pain, constipation and diarrhea.  Genitourinary: Negative for dysuria, frequency and urgency.  Musculoskeletal: Positive for back pain and myalgias. Negative for falls and joint pain.  Skin: Negative.   Neurological: Negative for dizziness and headaches.  Psychiatric/Behavioral: Negative for depression and memory loss. The patient does not have insomnia.     Past Medical History:  Diagnosis Date  . Allergic rhinitis due to pollen   . Anxiety state, unspecified   . Arthritis    "right knee" (06/21/2016)  . Asymptomatic varicose veins   . Carpal tunnel syndrome   . Chest pain, unspecified   . Chronic lower back pain    "on the left side" (06/21/2016)  . Cramp of limb   . Depressive disorder, not elsewhere classified   . Diaphragmatic hernia without mention of obstruction or gangrene   . Diaphragmatic hernia without mention of obstruction or gangrene   . Disturbance of skin sensation   . Diverticulosis of colon (without mention of hemorrhage)   . Enthesopathy of hip region   . Esophageal reflux   . Hepatitis, unspecified   . History of blood transfusion    "w/both knee replacements"  . History of duodenal ulcer   .  Insomnia, unspecified   . Lumbago   . Migraine    "none since ~ 1990" (06/21/2016)  . Myalgia and myositis, unspecified   . Obesity, unspecified   . Other abnormal blood chemistry   . Other dysphagia   . Other malaise and fatigue   . Other nonspecific abnormal serum enzyme levels   . Other specified cardiac dysrhythmias(427.89)   . Pacemaker   . Pain in joint, ankle and foot   . Pain in joint, hand   . Pain in joint, lower leg   . Pain in joint, pelvic region and thigh   . Pain in limb   . Plantar fascial fibromatosis   . Presence of permanent cardiac pacemaker   . Reflux esophagitis   . Sciatica   .  Spinal stenosis, unspecified region other than cervical   . Stricture and stenosis of esophagus   . Symptomatic menopausal or female climacteric states   . Unspecified essential hypertension   . Unspecified essential hypertension   . Unspecified vitamin D deficiency    Past Surgical History:  Procedure Laterality Date  . BREAST BIOPSY Left 1990s X 2  . CARDIOVERSION N/A 04/26/2016   Procedure: CARDIOVERSION;  Surgeon: Pixie Casino, MD;  Location: [place];  Service: Cardiovascular;  Laterality: N/A;  . CATARACT EXTRACTION W/ INTRAOCULAR LENS IMPLANT Left 08/03/1999   DR EPES   . CATARACT EXTRACTION W/ INTRAOCULAR LENS IMPLANT Right 2000   DR EPES  . [place]  . COLONOSCOPY  1988   Normal   . DENTAL SURGERY Left 08/2016  . EP IMPLANTABLE DEVICE N/A 06/21/2016   Procedure: Pacemaker Implant;  Surgeon: Evans Lance, MD;  Location: [place] [facility];  Service: Cardiovascular;  Laterality: N/A;  . ESOPHAGOGASTRODUODENOSCOPY (EGD) WITH ESOPHAGEAL DILATION  ~ 1982   Dr. Sharlett Iles  . EXCISION OF ACTINIC KERATOSIS     DR LUPTON   . EYE SURGERY    . INSERT / REPLACE / REMOVE PACEMAKER    . JOINT REPLACEMENT    . KNEE ARTHROSCOPY Left 2003  . KNEE ARTHROSCOPY Right 06-26-13  . KNEE CLOSED REDUCTION Right 10/23/2013   Procedure: CLOSED MANIPULATION RIGHT KNEE;  Surgeon: Mcarthur Rossetti, MD;  Location: [place];  Service: Orthopedics;  Laterality: Right;  . LASER FOR CLOUDY CAP LEFT EYE Left 03/2006   DR DIGBY  . TOTAL KNEE ARTHROPLASTY Left 04/2004   DR RENDALL  . TOTAL KNEE ARTHROPLASTY Right 07/04/2013   Procedure: RIGHT TOTAL KNEE ARTHROPLASTY;  Surgeon: Mcarthur Rossetti, MD;  Location: [facility];  Service: Orthopedics;  Laterality: Right;  Marland Kitchen VAGINAL HYSTERECTOMY  1979   Social History:   reports that  has never smoked. she has never used smokeless tobacco. She reports that she does not drink alcohol or use drugs.  Family History  Problem  Relation Age of Onset  . Ovarian cancer Mother   . Uterine cancer Mother   . Cerebrovascular Accident Mother   . Heart disease Father   . Hypertension Brother   . Obesity Daughter     Medications:   Medication List        Accurate as of 01/02/18  1:24 PM. Always use your most recent med list.          acetaminophen 500 MG tablet Commonly known as:  TYLENOL   ALPRAZolam 0.25 MG tablet Commonly known as:  XANAX Take one tablet by mouth at bedtime as needed for rest or  anxiety   antiseptic oral rinse Liqd   apixaban 5 MG Tabs tablet Commonly known as:  ELIQUIS Take one tablet by mouth twice daily for anticoagulation   bimatoprost 0.01 % Soln Commonly known as:  LUMIGAN   BIOTIN 5000 PO   dorzolamide 2 % ophthalmic solution Commonly known as:  TRUSOPT   esomeprazole 20 MG capsule Commonly known as:  NEXIUM TAKE 1 CAPSULE BY MOUTH EVERY DAY   furosemide 20 MG tablet Commonly known as:  LASIX Take 1 tablet (20 mg total) by mouth as needed for edema.   KETOPROFEN EX   methylcellulose 1 % ophthalmic solution Commonly known as:  ARTIFICIAL TEARS   metoprolol tartrate 50 MG tablet Commonly known as:  LOPRESSOR TAKE 1 TABLET BY MOUTH TWICE A DAY   propafenone 225 MG 12 hr capsule Commonly known as:  RYTHMOL SR Take 1 capsule (225 mg total) by mouth 2 (two) times daily.   traZODone 50 MG tablet Commonly known as:  DESYREL TAKE 1 TABLET BY MOUTH EVERY NIGHT TO HELP WITH SLEEP   valsartan-hydrochlorothiazide 320-25 MG tablet Commonly known as:  DIOVAN-HCT TAKE 1 TABLET BY MOUTH EVERY DAY   Vitamin D 2000 units tablet        Physical Exam:  Vitals:   01/02/18 1314  BP: 122/78  Pulse: 63  Resp: 17  Temp: 98.1 F (36.7 C)  TempSrc: Oral  SpO2: 96%  Weight: 187 lb (84.8 kg)  Height: 5' 2"  (1.575 m)   Body mass index is 34.2 kg/m.  Physical Exam  Constitutional: She is oriented to person, place, and time. She appears well-developed and  well-nourished. No distress.  Obese.  HENT:  Head: Normocephalic and atraumatic.  Right Ear: External ear normal.  Left Ear: External ear normal.  Eyes: Conjunctivae and EOM are normal. Pupils are equal, round, and reactive to light. Left eye exhibits no discharge. No scleral icterus.  Neck: Normal range of motion. Neck supple.  Cardiovascular: Normal rate, regular rhythm and normal heart sounds.  Pulmonary/Chest: Effort normal and breath sounds normal.  Pacemaker in left upper chest wall.  Abdominal: Soft. Bowel sounds are normal. She exhibits no distension. There is no tenderness.  Musculoskeletal: She exhibits no edema.  Neurological: She is alert and oriented to person, place, and time.  12/12/16 MMSE 30/30. Passed clock drawing.  Skin: Skin is warm and dry.  Psychiatric: She has a normal mood and affect. Her behavior is normal. Judgment and thought content normal.    Labs reviewed: Basic Metabolic Panel: Recent Labs    08/06/17 0918  NA 136  K 4.1  CL 105  CO2 27  GLUCOSE 93  BUN 14  CREATININE 0.74  CALCIUM 8.7  TSH 3.29   Liver Function Tests: Recent Labs    08/06/17 0918  AST 16  ALT 14  ALKPHOS 55  BILITOT 0.5  PROT 6.2  ALBUMIN 3.6   No results for input(s): LIPASE, AMYLASE in the last 8760 hours. No results for input(s): AMMONIA in the last 8760 hours. CBC: Recent Labs    08/06/17 0918  WBC 5.7  HGB 12.1  HCT 37.1  MCV 93.9  PLT 180   Lipid Panel: Recent Labs    08/06/17 0918  CHOL 197  HDL 35*  LDLCALC 120*  TRIG 208*  CHOLHDL 5.6*   TSH: Recent Labs    08/06/17 0918  TSH 3.29   A1C: Lab Results  Component Value Date   HGBA1C 5.2 08/06/2017  Assessment/Plan 1. Paroxysmal atrial fibrillation (HCC) -rate and rhythm regular at this time. conts on metoprolol and propafenone for rate and eliquis for anticoagulation   - CBC with Differential/Platelets; Future  2. Essential hypertension -stable on valsartan-hctz,  metoprolol; conts with dietary modifications.   3. Gastroesophageal reflux disease without esophagitis Continues on Nexium, encouraged compliance with lifestyle modifications to reduce symptoms.   4. Insomnia, unspecified type Stable on trazodone  5. Hyperlipidemia, unspecified hyperlipidemia type LDL 120 on last labs, discussed lifestyle/diet modifications. - Lipid Panel; Future - CMP with eGFR; Future  6. Spinal stenosis of lumbar region with neurogenic claudication -has been having pain to lower back and following with orthopedics which recommended injection to lumbar spine however would require her to be off eliquis to decrease bleeding risk. She is not comfortable with to risk to get injection. Currently taking tylenol once daily with good effects. Also states when she does her stretches and exercises it helps the pain.  -okay to use tylenol 500 mg 1-2 tablets every 8 hours as needed for pain.  -discussed PT and she will call if she decides she wants to go that route for [place] to place referral but would like to do her own exercises first.   Next appt:  Ramie Erman K. Harle Battiest  [facility] [phone] 8 am - 5 pm) [phone] (after hours)

## 2018-01-02 NOTE — Patient Instructions (Addendum)
Increase water intake Okay to use tylenol 500 mg 1-2 tablets every 8 hours as needed for pain.   DASH Eating Plan DASH stands for "Dietary Approaches to Stop Hypertension." The DASH eating plan is a healthy eating plan that has been shown to reduce high blood pressure (hypertension). It may also reduce your risk for type 2 diabetes, heart disease, and stroke. The DASH eating plan may also help with weight loss. What are tips for following this plan? General guidelines  Avoid eating more than 2,300 mg (milligrams) of salt (sodium) a day. If you have hypertension, you may need to reduce your sodium intake to 1,500 mg a day.  Limit alcohol intake to no more than 1 drink a day for nonpregnant women and 2 drinks a day for men. One drink equals 12 oz of beer, 5 oz of wine, or 1 oz of hard liquor.  Work with your health care provider to maintain a healthy body weight or to lose weight. Ask what an ideal weight is for you.  Get at least 30 minutes of exercise that causes your heart to beat faster (aerobic exercise) most days of the week. Activities may include walking, swimming, or biking.  Work with your health care provider or diet and nutrition specialist (dietitian) to adjust your eating plan to your individual calorie needs. Reading food labels  Check food labels for the amount of sodium per serving. Choose foods with less than 5 percent of the Daily Value of sodium. Generally, foods with less than 300 mg of sodium per serving fit into this eating plan.  To find whole grains, look for the word "whole" as the first word in the ingredient list. Shopping  Buy products labeled as "low-sodium" or "no salt added."  Buy fresh foods. Avoid canned foods and premade or frozen meals. Cooking  Avoid adding salt when cooking. Use salt-free seasonings or herbs instead of table salt or sea salt. Check with your health care provider or pharmacist before using salt substitutes.  Do not fry foods. Cook  foods using healthy methods such as baking, boiling, grilling, and broiling instead.  Cook with heart-healthy oils, such as olive, canola, soybean, or sunflower oil. Meal planning   Eat a balanced diet that includes: ? 5 or more servings of fruits and vegetables each day. At each meal, try to fill half of your plate with fruits and vegetables. ? Up to 6-8 servings of whole grains each day. ? Less than 6 oz of lean meat, poultry, or fish each day. A 3-oz serving of meat is about the same size as a deck of cards. One egg equals 1 oz. ? 2 servings of low-fat dairy each day. ? A serving of nuts, seeds, or beans 5 times each week. ? Heart-healthy fats. Healthy fats called Omega-3 fatty acids are found in foods such as flaxseeds and coldwater fish, like sardines, salmon, and mackerel.  Limit how much you eat of the following: ? Canned or prepackaged foods. ? Food that is high in trans fat, such as fried foods. ? Food that is high in saturated fat, such as fatty meat. ? Sweets, desserts, sugary drinks, and other foods with added sugar. ? Full-fat dairy products.  Do not salt foods before eating.  Try to eat at least 2 vegetarian meals each week.  Eat more home-cooked food and less restaurant, buffet, and fast food.  When eating at a restaurant, ask that your food be prepared with less salt or no salt, if  possible. What foods are recommended? The items listed may not be a complete list. Talk with your dietitian about what dietary choices are best for you. Grains Whole-grain or whole-wheat bread. Whole-grain or whole-wheat pasta. Brown rice. [place]row. Bulgur. Whole-grain and low-sodium cereals. Pita bread. Low-fat, low-sodium crackers. Whole-wheat flour tortillas. Vegetables Fresh or frozen vegetables (raw, steamed, roasted, or grilled). Low-sodium or reduced-sodium tomato and vegetable juice. Low-sodium or reduced-sodium tomato sauce and tomato paste. Low-sodium or reduced-sodium  canned vegetables. Fruits All fresh, dried, or frozen fruit. Canned fruit in natural juice (without added sugar). Meat and other protein foods Skinless chicken or [place]. Ground chicken or [place]. Pork with fat trimmed off. Fish and seafood. Egg whites. Dried beans, peas, or lentils. Unsalted nuts, nut butters, and seeds. Unsalted canned beans. Lean cuts of beef with fat trimmed off. Low-sodium, lean deli meat. Dairy Low-fat (1%) or fat-free (skim) milk. Fat-free, low-fat, or reduced-fat cheeses. Nonfat, low-sodium ricotta or cottage cheese. Low-fat or nonfat yogurt. Low-fat, low-sodium cheese. Fats and oils Soft margarine without trans fats. Vegetable oil. Low-fat, reduced-fat, or light mayonnaise and salad dressings (reduced-sodium). Canola, safflower, olive, soybean, and sunflower oils. Avocado. Seasoning and other foods Herbs. Spices. Seasoning mixes without salt. Unsalted popcorn and pretzels. Fat-free sweets. What foods are not recommended? The items listed may not be a complete list. Talk with your dietitian about what dietary choices are best for you. Grains Baked goods made with fat, such as croissants, muffins, or some breads. Dry pasta or rice meal packs. Vegetables Creamed or fried vegetables. Vegetables in a cheese sauce. Regular canned vegetables (not low-sodium or reduced-sodium). Regular canned tomato sauce and paste (not low-sodium or reduced-sodium). Regular tomato and vegetable juice (not low-sodium or reduced-sodium). Angie Fava. Olives. Fruits Canned fruit in a light or heavy syrup. Fried fruit. Fruit in cream or butter sauce. Meat and other protein foods Fatty cuts of meat. Ribs. Fried meat. Berniece Salines. Sausage. Bologna and other processed lunch meats. Salami. Fatback. Hotdogs. Bratwurst. Salted nuts and seeds. Canned beans with added salt. Canned or smoked fish. Whole eggs or egg yolks. Chicken or [place] with skin. Dairy Whole or 2% milk, cream, and half-and-half. Whole or  full-fat cream cheese. Whole-fat or sweetened yogurt. Full-fat cheese. Nondairy creamers. Whipped toppings. Processed cheese and cheese spreads. Fats and oils Butter. Stick margarine. Lard. Shortening. Ghee. Bacon fat. Tropical oils, such as coconut, palm kernel, or palm oil. Seasoning and other foods Salted popcorn and pretzels. Onion salt, garlic salt, seasoned salt, table salt, and sea salt. Worcestershire sauce. Tartar sauce. Barbecue sauce. Teriyaki sauce. Soy sauce, including reduced-sodium. Steak sauce. Canned and packaged gravies. Fish sauce. Oyster sauce. Cocktail sauce. Horseradish that you find on the shelf. Ketchup. Mustard. Meat flavorings and tenderizers. Bouillon cubes. Hot sauce and Tabasco sauce. Premade or packaged marinades. Premade or packaged taco seasonings. Relishes. Regular salad dressings. Where to find more information:  National Heart, Lung, and [place]: https://wilson-eaton.com/  American Heart Association: www.heart.org Summary  The DASH eating plan is a healthy eating plan that has been shown to reduce high blood pressure (hypertension). It may also reduce your risk for type 2 diabetes, heart disease, and stroke.  With the DASH eating plan, you should limit salt (sodium) intake to 2,300 mg a day. If you have hypertension, you may need to reduce your sodium intake to 1,500 mg a day.  When on the DASH eating plan, aim to eat more fresh fruits and vegetables, whole grains, lean proteins, low-fat dairy, and heart-healthy fats.  Work with your health care provider or diet and nutrition specialist (dietitian) to adjust your eating plan to your individual calorie needs. This information is not intended to replace advice given to you by your health care provider. Make sure you discuss any questions you have with your health care provider. Document Released: 11/30/2011 Document Revised: 12/04/2016 Document Reviewed: 12/04/2016 Elsevier Interactive Patient Education  [employer].

## 2018-01-07 ENCOUNTER — Other Ambulatory Visit: Payer: Medicare Other

## 2018-01-08 ENCOUNTER — Ambulatory Visit: Payer: Self-pay | Admitting: *Deleted

## 2018-01-11 ENCOUNTER — Other Ambulatory Visit: Payer: Medicare Other

## 2018-01-11 DIAGNOSIS — E785 Hyperlipidemia, unspecified: Secondary | ICD-10-CM

## 2018-01-11 DIAGNOSIS — I480 Paroxysmal atrial fibrillation: Secondary | ICD-10-CM

## 2018-01-11 LAB — CBC WITH DIFFERENTIAL/PLATELET
BASOS ABS: 31 {cells}/uL (ref 0–200)
Basophils Relative: 0.7 %
EOS ABS: 79 {cells}/uL (ref 15–500)
Eosinophils Relative: 1.8 %
HEMATOCRIT: 36.2 % (ref 35.0–45.0)
Hemoglobin: 12.5 g/dL (ref 11.7–15.5)
Lymphs Abs: 1558 cells/uL (ref 850–3900)
MCH: 31.3 pg (ref 27.0–33.0)
MCHC: 34.5 g/dL (ref 32.0–36.0)
MCV: 90.5 fL (ref 80.0–100.0)
MPV: 10.2 fL (ref 7.5–12.5)
Monocytes Relative: 9.7 %
NEUTROS PCT: 52.4 %
Neutro Abs: 2306 cells/uL (ref 1500–7800)
PLATELETS: 164 10*3/uL (ref 140–400)
RBC: 4 10*6/uL (ref 3.80–5.10)
RDW: 12.8 % (ref 11.0–15.0)
TOTAL LYMPHOCYTE: 35.4 %
WBC: 4.4 10*3/uL (ref 3.8–10.8)
WBCMIX: 427 {cells}/uL (ref 200–950)

## 2018-01-11 LAB — COMPLETE METABOLIC PANEL WITH GFR
AG Ratio: 1.4 (calc) (ref 1.0–2.5)
ALT: 17 U/L (ref 6–29)
AST: 17 U/L (ref 10–35)
Albumin: 3.8 g/dL (ref 3.6–5.1)
Alkaline phosphatase (APISO): 46 U/L (ref 33–130)
BILIRUBIN TOTAL: 0.5 mg/dL (ref 0.2–1.2)
BUN: 18 mg/dL (ref 7–25)
CHLORIDE: 105 mmol/L (ref 98–110)
CO2: 28 mmol/L (ref 20–32)
Calcium: 9.1 mg/dL (ref 8.6–10.4)
Creat: 0.84 mg/dL (ref 0.60–0.88)
GFR, EST AFRICAN AMERICAN: 74 mL/min/{1.73_m2} (ref >=60)
GFR, Est Non African American: 64 mL/min/{1.73_m2} (ref >=60)
GLUCOSE: 92 mg/dL (ref 65–99)
Globulin: 2.7 g/dL (calc) (ref 1.9–3.7)
Potassium: 3.9 mmol/L (ref 3.5–5.3)
Sodium: 139 mmol/L (ref 135–146)
TOTAL PROTEIN: 6.5 g/dL (ref 6.1–8.1)

## 2018-01-11 LAB — LIPID PANEL
CHOLESTEROL: 229 mg/dL — AB (ref <200)
HDL: 42 mg/dL — AB (ref >50)
LDL CHOLESTEROL (CALC): 159 mg/dL — AB
Non-HDL Cholesterol (Calc): 187 mg/dL (calc) — ABNORMAL HIGH (ref <130)
TRIGLYCERIDES: 152 mg/dL — AB (ref <150)
Total CHOL/HDL Ratio: 5.5 (calc) — ABNORMAL HIGH (ref <5.0)

## 2018-01-15 ENCOUNTER — Other Ambulatory Visit: Payer: Self-pay | Admitting: Nurse Practitioner

## 2018-01-15 MED ORDER — EZETIMIBE 10 MG PO TABS: 10.0000 mg | ORAL_TABLET | Freq: Every day | ORAL | 3 refills | Status: DC

## 2018-02-05 DIAGNOSIS — C44722 Squamous cell carcinoma of skin of right lower limb, including hip: Secondary | ICD-10-CM | POA: Diagnosis not present

## 2018-02-19 DIAGNOSIS — C44722 Squamous cell carcinoma of skin of right lower limb, including hip: Secondary | ICD-10-CM | POA: Diagnosis not present

## 2018-02-19 DIAGNOSIS — Z01411 Encounter for gynecological examination (general) (routine) with abnormal findings: Secondary | ICD-10-CM | POA: Diagnosis not present

## 2018-02-19 DIAGNOSIS — N952 Postmenopausal atrophic vaginitis: Secondary | ICD-10-CM | POA: Diagnosis not present

## 2018-02-21 MED FILL — SHINGRIX 50 MCG SUS: 50 | 1 days supply | Qty: 1 | Fill #0

## 2018-03-03 ENCOUNTER — Other Ambulatory Visit: Payer: Self-pay | Admitting: Nurse Practitioner

## 2018-03-08 ENCOUNTER — Other Ambulatory Visit: Payer: Self-pay | Admitting: Internal Medicine

## 2018-03-08 DIAGNOSIS — I4819 Other persistent atrial fibrillation: Secondary | ICD-10-CM

## 2018-03-10 ENCOUNTER — Other Ambulatory Visit: Payer: Self-pay | Admitting: Internal Medicine

## 2018-03-10 DIAGNOSIS — I4819 Other persistent atrial fibrillation: Secondary | ICD-10-CM

## 2018-03-25 ENCOUNTER — Ambulatory Visit (INDEPENDENT_AMBULATORY_CARE_PROVIDER_SITE_OTHER): Payer: Medicare Other | Admitting: *Deleted

## 2018-03-25 DIAGNOSIS — I480 Paroxysmal atrial fibrillation: Secondary | ICD-10-CM | POA: Diagnosis not present

## 2018-03-25 NOTE — Progress Notes (Signed)
Remote pacemaker transmission.   

## 2018-03-26 ENCOUNTER — Encounter: Payer: Self-pay | Admitting: Cardiology

## 2018-04-03 DIAGNOSIS — Z1231 Encounter for screening mammogram for malignant neoplasm of breast: Secondary | ICD-10-CM | POA: Diagnosis not present

## 2018-04-03 DIAGNOSIS — Z803 Family history of malignant neoplasm of breast: Secondary | ICD-10-CM | POA: Diagnosis not present

## 2018-04-07 NOTE — Progress Notes (Signed)
Cardiology Office Note   Date:  04/09/2018   ID:  [name], DOB 06-30-1931, MRN [id number]  PCP:  Lauree Chandler, NP  Cardiologist:   Odella Appelhans [place], MD   Chief Complaint  Patient presents with  . Atrial Fibrillation      History of Present Illness: [name] is a 84 y.o. female is seen for follow up Afib/flutter. She has a long history of marked sinus bradycardia. She was seen initially in 2014. Echo at that time showed mild LAE otherwise normal. Holter showed mean HR 59 with lowest HR 43 and peak HR 109.    On March 9,2017 she noted an increased HR by BP monitor up to 153. At this time she felt marked indigestion from her waist to her neck. She felt her heart fluttering.   She was seen by Dr. Nyoka Cowden on 03/29/16 and found to be in Afib with RVR. She was started on Eliquis and metoprolol. Myoview study and Echo were normal.  She later had attempt at DCCV. She was in an atypical atrial flutter at that time. DCCV resulted in very prolonged pauses > 6 seconds and return to flutter. She was seen by Dr. Curt Bears and placed on flecainide. This did convert her to NSR but made her feel very sick with nausea, dizziness, and extreme fatigue. Flecainide was discontinued.. She continued to have marked bradycardia and underwent PPM placement on 06/21/16. When seen in September by Dr. Lovena Le she had an Afib burden of 94%. She was started on propafenone for her Afib. Initially this medication caused her to have more nausea but this has improved.  On last pacer check in April this had decreased to 25%. Rate was controlled.    She is followed by ortho with back and right hip pain. L5 and right hip injection recommended but she has been reluctant to come off anticoagulation for this. She is doing well from a cardiac standpoint. She does note in the evening that she gets some dizziness, chest tightness, and thinks she is out of rhythm. She is walking at Sport time regularly. She does note a dry cough  and some hair loss.    Past Medical History:  Diagnosis Date  . Allergic rhinitis due to pollen   . Anxiety state, unspecified   . Arthritis    "right knee" (06/21/2016)  . Asymptomatic varicose veins   . Carpal tunnel syndrome   . Chest pain, unspecified   . Chronic lower back pain    "on the left side" (06/21/2016)  . Cramp of limb   . Depressive disorder, not elsewhere classified   . Diaphragmatic hernia without mention of obstruction or gangrene   . Diaphragmatic hernia without mention of obstruction or gangrene   . Disturbance of skin sensation   . Diverticulosis of colon (without mention of hemorrhage)   . Enthesopathy of hip region   . Esophageal reflux   . Hepatitis, unspecified   . History of blood transfusion    "w/both knee replacements"  . History of duodenal ulcer   . Insomnia, unspecified   . Lumbago   . Migraine    "none since ~ 1990" (06/21/2016)  . Myalgia and myositis, unspecified   . Obesity, unspecified   . Other abnormal blood chemistry   . Other dysphagia   . Other malaise and fatigue   . Other nonspecific abnormal serum enzyme levels   . Other specified cardiac dysrhythmias(427.89)   . Pacemaker   . Pain  in joint, ankle and foot   . Pain in joint, hand   . Pain in joint, lower leg   . Pain in joint, pelvic region and thigh   . Pain in limb   . Plantar fascial fibromatosis   . Presence of permanent cardiac pacemaker   . Reflux esophagitis   . Sciatica   . Spinal stenosis, unspecified region other than cervical   . Stricture and stenosis of esophagus   . Symptomatic menopausal or female climacteric states   . Unspecified essential hypertension   . Unspecified essential hypertension   . Unspecified vitamin D deficiency     Past Surgical History:  Procedure Laterality Date  . BREAST BIOPSY Left 1990s X 2  . CARDIOVERSION N/A 04/26/2016   Procedure: CARDIOVERSION;  Surgeon: Pixie Casino, MD;  Location: [facility];  Service: Cardiovascular;   Laterality: N/A;  . CATARACT EXTRACTION W/ INTRAOCULAR LENS IMPLANT Left 08/03/1999   DR EPES   . CATARACT EXTRACTION W/ INTRAOCULAR LENS IMPLANT Right 2000   DR EPES  . [place]  . COLONOSCOPY  1988   Normal   . DENTAL SURGERY Left 08/2016  . EP IMPLANTABLE DEVICE N/A 06/21/2016   Procedure: Pacemaker Implant;  Surgeon: Evans Lance, MD;  Location: [facility];  Service: Cardiovascular;  Laterality: N/A;  . ESOPHAGOGASTRODUODENOSCOPY (EGD) WITH ESOPHAGEAL DILATION  ~ 1982   Dr. Sharlett Iles  . EXCISION OF ACTINIC KERATOSIS     DR LUPTON   . EYE SURGERY    . INSERT / REPLACE / REMOVE PACEMAKER    . JOINT REPLACEMENT    . KNEE ARTHROSCOPY Left 2003  . KNEE ARTHROSCOPY Right 06-26-13  . KNEE CLOSED REDUCTION Right 10/23/2013   Procedure: CLOSED MANIPULATION RIGHT KNEE;  Surgeon: Mcarthur Rossetti, MD;  Location: [facility];  Service: Orthopedics;  Laterality: Right;  . LASER FOR CLOUDY CAP LEFT EYE Left 03/2006   DR DIGBY  . TOTAL KNEE ARTHROPLASTY Left 04/2004   DR RENDALL  . TOTAL KNEE ARTHROPLASTY Right 07/04/2013   Procedure: RIGHT TOTAL KNEE ARTHROPLASTY;  Surgeon: Mcarthur Rossetti, MD;  Location: [facility];  Service: Orthopedics;  Laterality: Right;  Marland Kitchen VAGINAL HYSTERECTOMY  1979     Current Outpatient Medications  Medication Sig Dispense Refill  . acetaminophen (TYLENOL) 500 MG tablet Take 500 mg by mouth every 6 (six) hours as needed for mild pain.     Marland Kitchen ALPRAZolam (XANAX) 0.25 MG tablet Take one tablet by mouth at bedtime as needed for rest or anxiety 30 tablet 1  . antiseptic oral rinse (BIOTENE) LIQD 15 mLs by Mouth Rinse route daily as needed for dry mouth.    . bimatoprost (LUMIGAN) 0.01 % SOLN Instill 1 drop into both eyes once daily in the evening    . BIOTIN 5000 PO Take 1 capsule by mouth daily.    . Cholecalciferol (VITAMIN D) 2000 UNITS tablet Take 2,000 Units by mouth daily.    . dorzolamide (TRUSOPT) 2 % ophthalmic solution  Place 1 drop into both eyes 3 (three) times daily.     Marland Kitchen ELIQUIS 5 MG TABS tablet TAKE ONE TABLET BY MOUTH TWICE DAILY FOR ANTICOAGULATION 180 tablet 1  . esomeprazole (NEXIUM) 20 MG capsule TAKE 1 CAPSULE BY MOUTH EVERY DAY 30 capsule 2  . ezetimibe (ZETIA) 10 MG tablet Take 1 tablet (10 mg total) by mouth daily. 90 tablet 3  . furosemide (LASIX) 20 MG tablet Take  1 tablet (20 mg total) by mouth as needed for edema. 90 tablet 3  . KETOPROFEN EX Apply 1 application topically daily as needed (pain). 20% cream; PRN for pain.    . methylcellulose (ARTIFICIAL TEARS) 1 % ophthalmic solution Place 1 drop into both eyes daily as needed (for dry eyes).     . metoprolol tartrate (LOPRESSOR) 50 MG tablet TAKE 1 TABLET BY MOUTH TWICE A DAY 60 tablet 11  . propafenone (RYTHMOL SR) 225 MG 12 hr capsule Take 1 capsule (225 mg total) by mouth 2 (two) times daily. 180 capsule 3  . traZODone (DESYREL) 50 MG tablet TAKE 1 TABLET BY MOUTH EVERY NIGHT TO HELP WITH SLEEP 30 tablet 5  . valsartan-hydrochlorothiazide (DIOVAN-HCT) 320-25 MG tablet TAKE 1 TABLET BY MOUTH EVERY DAY 30 tablet 4   No current facility-administered medications for this visit.     Allergies:   Advil [ibuprofen]; Aleve [naproxen sodium]; Aspirin; Atorvastatin; Celebrex [celecoxib]; Diclofenac; Doxycycline; Metoclopramide hcl; Nsaids; Other; Pravastatin; and Timolol    Social History:  The patient  reports that she has never smoked. She has never used smokeless tobacco. She reports that she does not drink alcohol or use drugs.   Family History:  The patient's family history includes Cerebrovascular Accident in her mother; Heart disease in her father; Hypertension in her brother; Obesity in her daughter; Ovarian cancer in her mother; Uterine cancer in her mother.    ROS:  Please see the history of present illness.   Otherwise, review of systems are positive for none.   All other systems are reviewed and negative.    PHYSICAL EXAM: VS:  BP  125/65   Pulse 63   Ht 5\' 3"  (1.6 m)   Wt 184 lb 12.8 oz (83.8 kg)   SpO2 95%   BMI 32.74 kg/m  , BMI Body mass index is 32.74 kg/m. GENERAL:  Well appearing, obese WF in NAD HEENT:  PERRL, EOMI, sclera are clear. Oropharynx is clear. NECK:  No jugular venous distention, carotid upstroke brisk and symmetric, no bruits, no thyromegaly or adenopathy LUNGS:  Clear to auscultation bilaterally CHEST:  Unremarkable HEART:  RRR,  PMI not displaced or sustained,S1 and S2 within normal limits, no S3, no S4: no clicks, no rubs, no murmurs ABD:  Soft, nontender. BS +, no masses or bruits. No hepatomegaly, no splenomegaly EXT:  2 + pulses throughout, no edema, no cyanosis no clubbing SKIN:  Warm and dry.  No rashes NEURO:  Alert and oriented x 3. Cranial nerves II through XII intact. PSYCH:  Cognitively intact  EKG:  EKG is not  ordered today.   Recent Labs: 08/06/2017: TSH 3.29 01/11/2018: ALT 17; BUN 18; Creat 0.84; Hemoglobin 12.5; Platelets 164; Potassium 3.9; Sodium 139    Lipid Panel    Component Value Date/Time   CHOL 229 (H) 01/11/2018 0955   CHOL 179 03/27/2016 0853   TRIG 152 (H) 01/11/2018 0955   HDL 42 (L) 01/11/2018 0955   HDL 42 03/27/2016 0853   CHOLHDL 5.5 (H) 01/11/2018 0955   VLDL 42 (H) 08/06/2017 0918   LDLCALC 159 (H) 01/11/2018 0955      Wt Readings from Last 3 Encounters:  04/09/18 184 lb 12.8 oz (83.8 kg)  01/02/18 187 lb (84.8 kg)  11/21/17 185 lb (83.9 kg)      Other studies Reviewed: Additional studies/ records that were reviewed today include:  Echo: 09/06/17: Study Conclusions  - Left ventricle: The cavity size was normal. Wall thickness  was   increased in a pattern of mild LVH. Systolic function was normal.   The estimated ejection fraction was in the range of 55% to 60%.   Doppler parameters are consistent with both elevated ventricular   end-diastolic filling pressure and elevated left atrial filling   pressure. - Left atrium: The atrium was  moderately dilated. - Atrial septum: There was increased thickness of the septum,   consistent with lipomatous hypertrophy. No defect or patent   foramen ovale was identified  ASSESSMENT AND PLAN:  1.  Atrial fibrillation/flutter with RVR. Unsuccessful DCCV with marked conversion pauses. She did convert on flecainide but unable to tolerate this medication. Now s/p PPM placement. Started on propafenone for Afib last year. Afib burden has decreased steadily.  She is clearly doing better on medication. Will continue same. Continue Eliquis for anticoagulation.  2. HTN well controlled.  3. Mild hypercholesterolemia.   Disposition: follow up with me in 6 months.   Signed, Shellye Zandi [place], [place]  04/09/2018 4:56 PM    [facility] [address] [place], [place], [address] Phone [phone], Fax [phone]

## 2018-04-09 ENCOUNTER — Encounter: Payer: Self-pay | Admitting: Cardiology

## 2018-04-09 ENCOUNTER — Ambulatory Visit: Payer: Medicare Other | Admitting: Cardiology

## 2018-04-09 VITALS — BP 125/65 | HR 63 | Ht 63.0 in | Wt 184.8 lb

## 2018-04-09 DIAGNOSIS — I10 Essential (primary) hypertension: Secondary | ICD-10-CM | POA: Diagnosis not present

## 2018-04-09 DIAGNOSIS — E7800 Pure hypercholesterolemia, unspecified: Secondary | ICD-10-CM

## 2018-04-09 DIAGNOSIS — I480 Paroxysmal atrial fibrillation: Secondary | ICD-10-CM

## 2018-04-09 LAB — CUP PACEART REMOTE DEVICE CHECK
Battery Voltage: 3.01 V
Brady Statistic AP VP Percent: 1 %
Brady Statistic AS VP Percent: 2.8 %
Brady Statistic RA Percent Paced: 74 %
Brady Statistic RV Percent Paced: 3 %
Date Time Interrogation Session: 20190401060022
Implantable Lead Implant Date: 20170628
Implantable Lead Location: 753859
Implantable Pulse Generator Implant Date: 20170628
Lead Channel Impedance Value: 490 Ohm
Lead Channel Pacing Threshold Amplitude: 0.5 V
Lead Channel Pacing Threshold Pulse Width: 0.4 ms
Lead Channel Pacing Threshold Pulse Width: 0.4 ms
Lead Channel Sensing Intrinsic Amplitude: 6.9 mV
Lead Channel Setting Pacing Amplitude: 2 V
Lead Channel Setting Pacing Amplitude: 2.5 V
Lead Channel Setting Sensing Sensitivity: 2 mV
MDC IDC LEAD IMPLANT DT: 20170628
MDC IDC LEAD LOCATION: 753860
MDC IDC MSMT BATTERY REMAINING LONGEVITY: 119 mo
MDC IDC MSMT BATTERY REMAINING PERCENTAGE: 95.5 %
MDC IDC MSMT LEADCHNL RA IMPEDANCE VALUE: 480 Ohm
MDC IDC MSMT LEADCHNL RA SENSING INTR AMPL: 2.1 mV
MDC IDC MSMT LEADCHNL RV PACING THRESHOLD AMPLITUDE: 1 V
MDC IDC SET LEADCHNL RV PACING PULSEWIDTH: 0.4 ms
MDC IDC STAT BRADY AP VS PERCENT: 74 %
MDC IDC STAT BRADY AS VS PERCENT: 22 %
Pulse Gen Model: 2272
Pulse Gen Serial Number: 7910313

## 2018-04-09 NOTE — Patient Instructions (Signed)
Continue your current therapy  I will see you in 6 months.   

## 2018-05-02 ENCOUNTER — Other Ambulatory Visit: Payer: Self-pay | Admitting: Cardiology

## 2018-05-02 ENCOUNTER — Encounter: Payer: Self-pay | Admitting: Nurse Practitioner

## 2018-05-02 ENCOUNTER — Ambulatory Visit: Payer: Medicare Other | Admitting: Nurse Practitioner

## 2018-05-02 VITALS — BP 124/78 | HR 64 | Temp 98.7°F | Ht 63.0 in | Wt 186.0 lb

## 2018-05-02 DIAGNOSIS — I480 Paroxysmal atrial fibrillation: Secondary | ICD-10-CM | POA: Diagnosis not present

## 2018-05-02 DIAGNOSIS — M48062 Spinal stenosis, lumbar region with neurogenic claudication: Secondary | ICD-10-CM | POA: Diagnosis not present

## 2018-05-02 DIAGNOSIS — K5903 Drug induced constipation: Secondary | ICD-10-CM | POA: Diagnosis not present

## 2018-05-02 DIAGNOSIS — K219 Gastro-esophageal reflux disease without esophagitis: Secondary | ICD-10-CM

## 2018-05-02 DIAGNOSIS — G4700 Insomnia, unspecified: Secondary | ICD-10-CM

## 2018-05-02 DIAGNOSIS — I10 Essential (primary) hypertension: Secondary | ICD-10-CM | POA: Diagnosis not present

## 2018-05-02 DIAGNOSIS — E7800 Pure hypercholesterolemia, unspecified: Secondary | ICD-10-CM | POA: Diagnosis not present

## 2018-05-02 DIAGNOSIS — B351 Tinea unguium: Secondary | ICD-10-CM

## 2018-05-02 MED ORDER — KETOPROFEN 10 % EX CREA: 1.0000 "application " | TOPICAL_CREAM | Freq: Every day | CUTANEOUS | 2 refills | Status: DC | PRN

## 2018-05-02 NOTE — Progress Notes (Signed)
Careteam: Patient Care Team: Lauree Chandler, NP as PCP - General (Geriatric Medicine) Mcarthur Rossetti, MD as Attending Physician (Orthopedic Surgery) Larey Dresser, MD as Attending Physician (Cardiology) Thornell Sartorius, MD as Consulting Physician (Otolaryngology) Druscilla Brownie, MD as Consulting Physician (Dermatology) Calvert Cantor, MD as Consulting Physician (Ophthalmology) Debara Pickett Nadean Corwin, MD as Consulting Physician (Cardiology)  Advanced Directive information Does Patient Have a Medical Advance Directive?: Yes, Type of Advance Directive: Healthcare Power of Attorney  Allergies  Allergen Reactions  . Advil [Ibuprofen]     GI upset  . Aleve [Naproxen Sodium]     GI upset  . Aspirin Nausea And Vomiting    Takes baby aspirin qod without problems  . Atorvastatin     Leg cramps  . Celebrex [Celecoxib]     GI upset  . Diclofenac     GI upset  . Doxycycline     Headache  . Metoclopramide Hcl     REACTION: heart palps  . Nsaids Other (See Comments)    Tears my stomach up   . Other     Antihistamine - increase BP  . Pravastatin     Leg cramps  . Timolol     Dropped HR    Chief Complaint  Patient presents with  . Medical Management of Chronic Issues    Pt is being seen for a 4 month routine visit. Pt would like to have her right ear checked-feels like she has bugs in it. Pt also wants her toes checked due to discoloration at times.   . ACP    Has HCPOA  . Medication Refill    Rx pended for combo pain cream     HPI: Patient is a 84 y.o. female seen in the office today for routine follow up.   afib- continues to follow up with cardiologist, rate controlled on propafenone. Having hair loss and unable to lose weight which she is contributing to this.   Chronic back pain/OA- using tylenol which constipates her at times. Has also been using mag oil for muscle and joint pain.   GERD- controlled on nexium as needed  Insomnia- sleeping well on  trazodone.   Color changes to left great toenail. Sore around toenail.   Review of Systems:  Review of Systems  Constitutional: Negative for chills, fever and weight loss.       Fatigue   HENT: Negative for tinnitus.   Respiratory: Negative for cough, sputum production and shortness of breath.   Cardiovascular: Negative for chest pain, palpitations and leg swelling.  Gastrointestinal: Positive for constipation and heartburn (with diet and nexium). Negative for abdominal pain and diarrhea.  Genitourinary: Negative for dysuria, frequency and urgency.  Musculoskeletal: Positive for back pain and myalgias. Negative for falls and joint pain.  Skin: Negative.   Neurological: Negative for dizziness and headaches.  Psychiatric/Behavioral: Negative for depression and memory loss. The patient does not have insomnia.     Past Medical History:  Diagnosis Date  . Allergic rhinitis due to pollen   . Anxiety state, unspecified   . Arthritis    "right knee" (06/21/2016)  . Asymptomatic varicose veins   . Carpal tunnel syndrome   . Chest pain, unspecified   . Chronic lower back pain    "on the left side" (06/21/2016)  . Cramp of limb   . Depressive disorder, not elsewhere classified   . Diaphragmatic hernia without mention of obstruction or gangrene   . Diaphragmatic hernia without  mention of obstruction or gangrene   . Disturbance of skin sensation   . Diverticulosis of colon (without mention of hemorrhage)   . Enthesopathy of hip region   . Esophageal reflux   . Hepatitis, unspecified   . History of blood transfusion    "w/both knee replacements"  . History of duodenal ulcer   . Insomnia, unspecified   . Lumbago   . Migraine    "none since ~ 1990" (06/21/2016)  . Myalgia and myositis, unspecified   . Obesity, unspecified   . Other abnormal blood chemistry   . Other dysphagia   . Other malaise and fatigue   . Other nonspecific abnormal serum enzyme levels   . Other specified cardiac  dysrhythmias(427.89)   . Pacemaker   . Pain in joint, ankle and foot   . Pain in joint, hand   . Pain in joint, lower leg   . Pain in joint, pelvic region and thigh   . Pain in limb   . Plantar fascial fibromatosis   . Presence of permanent cardiac pacemaker   . Reflux esophagitis   . Sciatica   . Spinal stenosis, unspecified region other than cervical   . Stricture and stenosis of esophagus   . Symptomatic menopausal or female climacteric states   . Unspecified essential hypertension   . Unspecified essential hypertension   . Unspecified vitamin D deficiency    Past Surgical History:  Procedure Laterality Date  . BREAST BIOPSY Left 1990s X 2  . CARDIOVERSION N/A 04/26/2016   Procedure: CARDIOVERSION;  Surgeon: Pixie Casino, MD;  Location: [place];  Service: Cardiovascular;  Laterality: N/A;  . CATARACT EXTRACTION W/ INTRAOCULAR LENS IMPLANT Left 08/03/1999   DR EPES   . CATARACT EXTRACTION W/ INTRAOCULAR LENS IMPLANT Right 2000   DR EPES  . [place]  . COLONOSCOPY  1988   Normal   . DENTAL SURGERY Left 08/2016  . EP IMPLANTABLE DEVICE N/A 06/21/2016   Procedure: Pacemaker Implant;  Surgeon: Evans Lance, MD;  Location: [facility];  Service: Cardiovascular;  Laterality: N/A;  . ESOPHAGOGASTRODUODENOSCOPY (EGD) WITH ESOPHAGEAL DILATION  ~ 1982   Dr. Sharlett Iles  . EXCISION OF ACTINIC KERATOSIS     DR LUPTON   . EYE SURGERY    . INSERT / REPLACE / REMOVE PACEMAKER    . JOINT REPLACEMENT    . KNEE ARTHROSCOPY Left 2003  . KNEE ARTHROSCOPY Right 06-26-13  . KNEE CLOSED REDUCTION Right 10/23/2013   Procedure: CLOSED MANIPULATION RIGHT KNEE;  Surgeon: Mcarthur Rossetti, MD;  Location: [facility];  Service: Orthopedics;  Laterality: Right;  . LASER FOR CLOUDY CAP LEFT EYE Left 03/2006   DR DIGBY  . TOTAL KNEE ARTHROPLASTY Left 04/2004   DR RENDALL  . TOTAL KNEE ARTHROPLASTY Right 07/04/2013   Procedure: RIGHT TOTAL KNEE ARTHROPLASTY;   Surgeon: Mcarthur Rossetti, MD;  Location: [facility];  Service: Orthopedics;  Laterality: Right;  Marland Kitchen VAGINAL HYSTERECTOMY  1979   Social History:   reports that she has never smoked. She has never used smokeless tobacco. She reports that she does not drink alcohol or use drugs.  Family History  Problem Relation Age of Onset  . Ovarian cancer Mother   . Uterine cancer Mother   . Cerebrovascular Accident Mother   . Heart disease Father   . Hypertension Brother   . Obesity Daughter     Medications: Patient's Medications  New Prescriptions  No medications on file  Previous Medications   ACETAMINOPHEN (TYLENOL) 500 MG TABLET    Take 500 mg by mouth every 6 (six) hours as needed for mild pain.    ALPRAZOLAM (XANAX) 0.25 MG TABLET    Take one tablet by mouth at bedtime as needed for rest or anxiety   ANTISEPTIC ORAL RINSE (BIOTENE) LIQD    15 mLs by Mouth Rinse route daily as needed for dry mouth.   BIMATOPROST (LUMIGAN) 0.01 % SOLN    Instill 1 drop into both eyes once daily in the evening   BIOTIN 5000 PO    Take 1 capsule by mouth daily.   CHOLECALCIFEROL (VITAMIN D) 2000 UNITS TABLET    Take 2,000 Units by mouth daily.   DORZOLAMIDE (TRUSOPT) 2 % OPHTHALMIC SOLUTION    Place 1 drop into both eyes 3 (three) times daily.    ELIQUIS 5 MG TABS TABLET    TAKE ONE TABLET BY MOUTH TWICE DAILY FOR ANTICOAGULATION   ESOMEPRAZOLE (NEXIUM) 20 MG CAPSULE    TAKE 1 CAPSULE BY MOUTH EVERY DAY   EZETIMIBE (ZETIA) 10 MG TABLET    Take 1 tablet (10 mg total) by mouth daily.   KETOPROFEN EX    Apply 1 application topically daily as needed (pain). 20% cream; PRN for pain.   METHYLCELLULOSE (ARTIFICIAL TEARS) 1 % OPHTHALMIC SOLUTION    Place 1 drop into both eyes daily as needed (for dry eyes).    METOPROLOL TARTRATE (LOPRESSOR) 50 MG TABLET    TAKE 1 TABLET BY MOUTH TWICE A DAY   PROPAFENONE (RYTHMOL SR) 225 MG 12 HR CAPSULE    Take 1 capsule (225 mg total) by mouth 2 (two) times daily.   TRAZODONE  (DESYREL) 50 MG TABLET    TAKE 1 TABLET BY MOUTH EVERY NIGHT TO HELP WITH SLEEP   VALSARTAN-HYDROCHLOROTHIAZIDE (DIOVAN-HCT) 320-25 MG TABLET    TAKE 1 TABLET BY MOUTH EVERY DAY  Modified Medications   No medications on file  Discontinued Medications   FUROSEMIDE (LASIX) 20 MG TABLET    Take 1 tablet (20 mg total) by mouth as needed for edema.     Physical Exam:  Vitals:   05/02/18 1316  BP: 124/78  Pulse: 64  Temp: 98.7 F (37.1 C)  TempSrc: Oral  SpO2: 96%  Weight: 186 lb (84.4 kg)  Height: 5\' 3"  (1.6 m)   Body mass index is 32.95 kg/m.  Physical Exam  Constitutional: She is oriented to person, place, and time. She appears well-developed and well-nourished. No distress.  Obese.  HENT:  Head: Normocephalic and atraumatic.  Right Ear: External ear normal.  Left Ear: External ear normal.  Nose: Nose normal.  Mouth/Throat: Oropharynx is clear and moist. No oropharyngeal exudate.  Eyes: Pupils are equal, round, and reactive to light. Conjunctivae and EOM are normal. Left eye exhibits no discharge. No scleral icterus.  Neck: Normal range of motion. Neck supple.  Cardiovascular: Normal rate, regular rhythm and normal heart sounds.  Pulmonary/Chest: Effort normal and breath sounds normal.  Pacemaker in left upper chest wall.  Abdominal: Soft. Bowel sounds are normal. She exhibits no distension. There is no tenderness.  Musculoskeletal: She exhibits no edema.  Neurological: She is alert and oriented to person, place, and time.  Skin: Skin is warm and dry.  Onychomycosis to left great toenail. Slight redness surrounding toenail and tenderness   Psychiatric: She has a normal mood and affect. Her behavior is normal. Judgment and thought content normal.  Labs reviewed: Basic Metabolic Panel: Recent Labs    08/06/17 0918 01/11/18 0955  NA 136 139  K 4.1 3.9  CL 105 105  CO2 27 28  GLUCOSE 93 92  BUN 14 18  CREATININE 0.74 0.84  CALCIUM 8.7 9.1  TSH 3.29  --    Liver  Function Tests: Recent Labs    08/06/17 0918 01/11/18 0955  AST 16 17  ALT 14 17  ALKPHOS 55  --   BILITOT 0.5 0.5  PROT 6.2 6.5  ALBUMIN 3.6  --    No results for input(s): LIPASE, AMYLASE in the last 8760 hours. No results for input(s): AMMONIA in the last 8760 hours. CBC: Recent Labs    08/06/17 0918 01/11/18 0955  WBC 5.7 4.4  NEUTROABS  --  2,306  HGB 12.1 12.5  HCT 37.1 36.2  MCV 93.9 90.5  PLT 180 164   Lipid Panel: Recent Labs    08/06/17 0918 01/11/18 0955  CHOL 197 229*  HDL 35* 42*  LDLCALC 120* 159*  TRIG 208* 152*  CHOLHDL 5.6* 5.5*   TSH: Recent Labs    08/06/17 0918  TSH 3.29   A1C: Lab Results  Component Value Date   HGBA1C 5.2 08/06/2017     Assessment/Plan 1. Spinal stenosis of lumbar region with neurogenic claudication -stable, continues on tylenol as needed  - Ketoprofen 10 % CREA; Apply 1 application topically daily as needed (pain). 20% cream; PRN for pain.  Dispense: 30 mL; Refill: 2  2. Paroxysmal atrial fibrillation (HCC) SR today, rate controlled on current regimen.  Continues on eliquis for anticoaguation  - CBC with Differential/Platelets; Future  3. HYPERCHOLESTEROLEMIA -currently on zetia 10 mg daily with dietary modifications.  - COMPLETE METABOLIC PANEL WITH GFR; Future - Lipid panel; Future  4. Gastroesophageal reflux disease without esophagitis Stable on nexium and diet modifications.   5. Insomnia, unspecified type Stable on trazodone, continue current regimen.   6. Essential hypertension -stable on current regimen.   7. Onychomycosis of great toe -to use OTC toenail paint to left great toe routinely due  -to use epsom salt soaks TID due to tenderness  8. Constipation -to start miralax 17gm daily   Next appt: 05/07/2018 for fasting labs, and 5 months for routine follow up.  Carlos American. [place], [facility] [phone]

## 2018-05-02 NOTE — Patient Instructions (Addendum)
Follow up this week or next for fasting blood work Follow up in 5 months for routine follow up  Epsom salt soak to left foot to help sore toe To use fungal polish which is over the counter  To hold off on cleaning out ears and see if feeling improves  miralax 17 gm into fluid of choice daily or every other day for constipation

## 2018-05-07 ENCOUNTER — Other Ambulatory Visit: Payer: Medicare Other

## 2018-05-07 DIAGNOSIS — E7800 Pure hypercholesterolemia, unspecified: Secondary | ICD-10-CM

## 2018-05-07 DIAGNOSIS — I480 Paroxysmal atrial fibrillation: Secondary | ICD-10-CM

## 2018-05-07 LAB — COMPLETE METABOLIC PANEL WITH GFR
AG Ratio: 1.5 (calc) (ref 1.0–2.5)
ALKALINE PHOSPHATASE (APISO): 48 U/L (ref 33–130)
ALT: 23 U/L (ref 6–29)
AST: 22 U/L (ref 10–35)
Albumin: 3.9 g/dL (ref 3.6–5.1)
BUN: 16 mg/dL (ref 7–25)
CO2: 31 mmol/L (ref 20–32)
CREATININE: 0.69 mg/dL (ref 0.60–0.88)
Calcium: 8.9 mg/dL (ref 8.6–10.4)
Chloride: 103 mmol/L (ref 98–110)
GFR, EST AFRICAN AMERICAN: 93 mL/min/{1.73_m2} (ref >=60)
GFR, Est Non African American: 80 mL/min/{1.73_m2} (ref >=60)
GLOBULIN: 2.6 g/dL (ref 1.9–3.7)
Glucose, Bld: 99 mg/dL (ref 65–99)
Potassium: 3.5 mmol/L (ref 3.5–5.3)
SODIUM: 139 mmol/L (ref 135–146)
TOTAL PROTEIN: 6.5 g/dL (ref 6.1–8.1)
Total Bilirubin: 0.5 mg/dL (ref 0.2–1.2)

## 2018-05-07 LAB — CBC WITH DIFFERENTIAL/PLATELET
BASOS PCT: 1.1 %
Basophils Absolute: 51 cells/uL (ref 0–200)
Eosinophils Absolute: 120 cells/uL (ref 15–500)
Eosinophils Relative: 2.6 %
HCT: 35.9 % (ref 35.0–45.0)
Hemoglobin: 12.6 g/dL (ref 11.7–15.5)
Lymphs Abs: 1983 cells/uL (ref 850–3900)
MCH: 31.3 pg (ref 27.0–33.0)
MCHC: 35.1 g/dL (ref 32.0–36.0)
MCV: 89.3 fL (ref 80.0–100.0)
MONOS PCT: 8.7 %
MPV: 10.4 fL (ref 7.5–12.5)
NEUTROS PCT: 44.5 %
Neutro Abs: 2047 cells/uL (ref 1500–7800)
Platelets: 180 10*3/uL (ref 140–400)
RBC: 4.02 10*6/uL (ref 3.80–5.10)
RDW: 12.8 % (ref 11.0–15.0)
TOTAL LYMPHOCYTE: 43.1 %
WBC mixed population: 400 cells/uL (ref 200–950)
WBC: 4.6 10*3/uL (ref 3.8–10.8)

## 2018-05-07 LAB — LIPID PANEL
CHOLESTEROL: 151 mg/dL (ref <200)
HDL: 39 mg/dL — ABNORMAL LOW (ref >50)
LDL CHOLESTEROL (CALC): 85 mg/dL
Non-HDL Cholesterol (Calc): 112 mg/dL (calc) (ref <130)
TRIGLYCERIDES: 160 mg/dL — AB (ref <150)
Total CHOL/HDL Ratio: 3.9 (calc) (ref <5.0)

## 2018-05-29 DIAGNOSIS — M84374A Stress fracture, right foot, initial encounter for fracture: Secondary | ICD-10-CM | POA: Diagnosis not present

## 2018-05-29 DIAGNOSIS — L602 Onychogryphosis: Secondary | ICD-10-CM | POA: Diagnosis not present

## 2018-05-30 DIAGNOSIS — H40013 Open angle with borderline findings, low risk, bilateral: Secondary | ICD-10-CM | POA: Diagnosis not present

## 2018-05-30 DIAGNOSIS — L603 Nail dystrophy: Secondary | ICD-10-CM | POA: Diagnosis not present

## 2018-06-03 DIAGNOSIS — L821 Other seborrheic keratosis: Secondary | ICD-10-CM | POA: Diagnosis not present

## 2018-06-03 DIAGNOSIS — Z85828 Personal history of other malignant neoplasm of skin: Secondary | ICD-10-CM | POA: Diagnosis not present

## 2018-06-03 DIAGNOSIS — D1801 Hemangioma of skin and subcutaneous tissue: Secondary | ICD-10-CM | POA: Diagnosis not present

## 2018-06-03 DIAGNOSIS — L309 Dermatitis, unspecified: Secondary | ICD-10-CM | POA: Diagnosis not present

## 2018-06-03 DIAGNOSIS — L814 Other melanin hyperpigmentation: Secondary | ICD-10-CM | POA: Diagnosis not present

## 2018-06-11 DIAGNOSIS — L603 Nail dystrophy: Secondary | ICD-10-CM | POA: Diagnosis not present

## 2018-06-24 ENCOUNTER — Telehealth: Payer: Self-pay | Admitting: Cardiology

## 2018-06-24 ENCOUNTER — Ambulatory Visit (INDEPENDENT_AMBULATORY_CARE_PROVIDER_SITE_OTHER): Payer: Medicare Other | Admitting: *Deleted

## 2018-06-24 DIAGNOSIS — I480 Paroxysmal atrial fibrillation: Secondary | ICD-10-CM

## 2018-06-24 NOTE — Telephone Encounter (Signed)
LMOV[facility] reminding pt to send remote transmission.   

## 2018-06-25 NOTE — Progress Notes (Signed)
Remote pacemaker transmission.   

## 2018-07-01 ENCOUNTER — Ambulatory Visit (INDEPENDENT_AMBULATORY_CARE_PROVIDER_SITE_OTHER): Payer: Self-pay

## 2018-07-01 ENCOUNTER — Ambulatory Visit (INDEPENDENT_AMBULATORY_CARE_PROVIDER_SITE_OTHER): Payer: Medicare Other | Admitting: Physician Assistant

## 2018-07-01 ENCOUNTER — Encounter (INDEPENDENT_AMBULATORY_CARE_PROVIDER_SITE_OTHER): Payer: Self-pay | Admitting: Physician Assistant

## 2018-07-01 DIAGNOSIS — M65311 Trigger thumb, right thumb: Secondary | ICD-10-CM | POA: Diagnosis not present

## 2018-07-01 DIAGNOSIS — M25551 Pain in right hip: Secondary | ICD-10-CM

## 2018-07-01 MED ORDER — METHYLPREDNISOLONE ACETATE 40 MG/ML IJ SUSP
20.0000 mg | INTRAMUSCULAR | Status: AC | PRN
  Administered 2018-07-01: 20 mg

## 2018-07-01 MED ORDER — LIDOCAINE HCL 1 % IJ SOLN
0.5000 mL | INTRAMUSCULAR | Status: AC | PRN
  Administered 2018-07-01: .5 mL

## 2018-07-01 NOTE — Progress Notes (Signed)
Office Visit Note   Patient: [name]           Date of Birth: [birth year]           MRN: [id number] Visit Date: 07/01/2018              Requested by: Lauree Chandler, NP [place],  [id number] PCP: Lauree Chandler, NP   Assessment & Plan: Visit Diagnoses:  1. Pain in right hip   2. Trigger finger of right thumb     Plan: At this point time we will send her back to physical therapy for modalities of the lumbar spine home exercise program and strengthening.  See her back on an as-needed basis.  Follow-Up Instructions: Return if symptoms worsen or fail to improve.   Orders:  Orders Placed This Encounter  Procedures  . Hand/UE Inj  . XR HIP UNILAT W OR W/O PELVIS 2-3 VIEWS RIGHT  . XR Finger Thumb Right   No orders of the defined types were placed in this encounter.     Procedures: Hand/UE Inj: R thumb A1 for trigger finger on 07/01/2018 5:54 PM Details: volar approach Medications: 0.5 mL lidocaine 1 %; 20 mg methylPREDNISolone acetate 40 MG/ML Consent was given by the patient. Immediately prior to procedure a time out was called to verify the correct patient, procedure, equipment, support staff and site/side marked as required. Patient was prepped and draped in the usual sterile fashion.       Clinical Data: No additional findings.   Subjective: Chief Complaint  Patient presents with  . Right Hip - Pain  . Right Thumb - Pain    HPI [name] is well-known to Dr. Ninfa Linden service comes in today with right hip pain lateral and posterior aspect of the hip worse whenever she stands especially first few moments after she stands from sitting position she has to get her bearings due to low back pain.  She is unable to sleep bed due to her low back pain.  She is had no new injury.  She does have known lumbar spinal stenosis and facet arthrosis.  She has had epidural steroid injections in the past but now is on Eliquis and so unable to come off  the Eliquis for injections.  She is also having right thumb pain she is having trigger thumb in the past.  She reports this was about 10 years ago.  She has had no new injury to the right thumb. Review of Systems Please see HPI otherwise negative  Objective: Vital Signs: There were no vitals taken for this visit.  Physical Exam  Constitutional: She is oriented to person, place, and time. She appears well-developed and well-nourished. No distress.  Pulmonary/Chest: Effort normal.  Neurological: She is alert and oriented to person, place, and time.  Skin: She is not diaphoretic.  Psychiatric: She has a normal mood and affect.    Ortho Exam Right thumb active triggering thumb.  There is no erythema or signs of infection about the thumb.  The remainder of the hand is nontender.  Lower extremity she has 5 out of 5 strength throughout.  Good range of motion bilateral hips without significant pain.  No tenderness over the trochanteric region of either hip. Specialty Comments:  No specialty comments available.  Imaging: Xr Finger Thumb Right  Result Date: 07/01/2018 Right thumb: No acute fractures no bony abnormalities.  No subluxation dislocation.  CMC joint is well-maintained.  Xr Hip Unilat  W Or W/o Pelvis 2-3 Views Right  Result Date: 07/01/2018 AP pelvis and lateral view of the right hip: Hip joints well maintained.  No acute fractures.  No pelvic fractures or other bony abnormalities.    PMFS History: Patient Active Problem List   Diagnosis Date Noted  . Trochanteric bursitis, right hip 05/16/2017  . Cardiac pacemaker in situ 06/21/2016  . Atypical atrial flutter ([place])   . Chest pain 04/10/2016  . Atrial fibrillation ([place]) 03/29/2016  . Hyperglycemia 02/17/2015  . Obese 02/17/2015  . Insomnia 01/27/2014  . 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 Knee joint replacement by other means 10/01/2013  . Arthritis of knee, right 07/04/2013  . Osteoarthritis of right knee 07/01/2013    . Cervicalgia 07/01/2013  . Bradycardia 05/13/2013  . HYPERCHOLESTEROLEMIA 06/02/2009  . ANXIETY 06/02/2009  . Essential hypertension 06/02/2009  . ESOPHAGEAL STRICTURE 06/02/2009  . GERD 06/02/2009  . HIATAL HERNIA 06/02/2009  . DIVERTICULOSIS, COLON 06/02/2009  . Dysphagia, pharyngoesophageal phase 06/02/2009  . Backache 05/01/2007  . LEG PAIN, RIGHT 05/01/2007   Past Medical History:  Diagnosis Date  . Allergic rhinitis due to pollen   . Anxiety state, unspecified   . Arthritis    "right knee" (06/21/2016)  . Asymptomatic varicose veins   . Carpal tunnel syndrome   . Chest pain, unspecified   . Chronic lower back pain    "on the left side" (06/21/2016)  . Cramp of limb   . Depressive disorder, not elsewhere classified   . Diaphragmatic hernia without mention of obstruction or gangrene   . Diaphragmatic hernia without mention of obstruction or gangrene   . Disturbance of skin sensation   . Diverticulosis of colon (without mention of hemorrhage)   . Enthesopathy of hip region   . Esophageal reflux   . Hepatitis, unspecified   . History of blood transfusion    "w/both knee replacements"  . History of duodenal ulcer   . Insomnia, unspecified   . Lumbago   . Migraine    "none since ~ 1990" (06/21/2016)  . Myalgia and myositis, unspecified   . Obesity, unspecified   . Other abnormal blood chemistry   . Other dysphagia   . Other malaise and fatigue   . Other nonspecific abnormal serum enzyme levels   . Other specified cardiac dysrhythmias(427.89)   . Pacemaker   . Pain in joint, ankle and foot   . Pain in joint, hand   . Pain in joint, lower leg   . Pain in joint, pelvic region and thigh   . Pain in limb   . Plantar fascial fibromatosis   . Presence of permanent cardiac pacemaker   . Reflux esophagitis   . Sciatica   . Spinal stenosis, unspecified region other than cervical   . Stricture and stenosis of esophagus   . Symptomatic menopausal or female climacteric  states   . Unspecified essential hypertension   . Unspecified essential hypertension   . Unspecified vitamin D deficiency     Family History  Problem Relation Age of Onset  . Ovarian cancer Mother   . Uterine cancer Mother   . Cerebrovascular Accident Mother   . Heart disease Father   . Hypertension Brother   . Obesity Daughter     Past Surgical History:  Procedure Laterality Date  . BREAST BIOPSY Left 1990s X 2  . CARDIOVERSION N/A 04/26/2016   Procedure: CARDIOVERSION;  Surgeon: Pixie Casino, MD;  Location: [place];  Service: Cardiovascular;  Laterality: N/A;  . CATARACT EXTRACTION W/ INTRAOCULAR LENS IMPLANT Left 08/03/1999   DR EPES   . CATARACT EXTRACTION W/ INTRAOCULAR LENS IMPLANT Right 2000   DR EPES  . [place]  . COLONOSCOPY  1988   Normal   . DENTAL SURGERY Left 08/2016  . EP IMPLANTABLE DEVICE N/A 06/21/2016   Procedure: Pacemaker Implant;  Surgeon: Evans Lance, MD;  Location: [facility];  Service: Cardiovascular;  Laterality: N/A;  . ESOPHAGOGASTRODUODENOSCOPY (EGD) WITH ESOPHAGEAL DILATION  ~ 1982   Dr. Sharlett Iles  . EXCISION OF ACTINIC KERATOSIS     DR LUPTON   . EYE SURGERY    . INSERT / REPLACE / REMOVE PACEMAKER    . JOINT REPLACEMENT    . KNEE ARTHROSCOPY Left 2003  . KNEE ARTHROSCOPY Right 06-26-13  . KNEE CLOSED REDUCTION Right 10/23/2013   Procedure: CLOSED MANIPULATION RIGHT KNEE;  Surgeon: Mcarthur Rossetti, MD;  Location: [facility] [facility];  Service: Orthopedics;  Laterality: Right;  . LASER FOR CLOUDY CAP LEFT EYE Left 03/2006   DR DIGBY  . TOTAL KNEE ARTHROPLASTY Left 04/2004   DR RENDALL  . TOTAL KNEE ARTHROPLASTY Right 07/04/2013   Procedure: RIGHT TOTAL KNEE ARTHROPLASTY;  Surgeon: Mcarthur Rossetti, MD;  Location: [facility];  Service: Orthopedics;  Laterality: Right;  Marland Kitchen VAGINAL HYSTERECTOMY  1979   Social History   Occupational History  . Not on file  Tobacco Use  . Smoking status: Never Smoker    . Smokeless tobacco: Never Used  Substance and Sexual Activity  . Alcohol use: No  . Drug use: No  . Sexual activity: Yes

## 2018-07-04 DIAGNOSIS — M545 Low back pain: Secondary | ICD-10-CM | POA: Diagnosis not present

## 2018-07-08 DIAGNOSIS — M545 Low back pain: Secondary | ICD-10-CM | POA: Diagnosis not present

## 2018-07-09 LAB — CUP PACEART REMOTE DEVICE CHECK
Battery Remaining Longevity: 119 mo
Battery Voltage: 2.99 V
Brady Statistic AP VP Percent: 1 %
Brady Statistic AS VP Percent: 2.4 %
Brady Statistic RA Percent Paced: 77 %
Implantable Lead Implant Date: 20170628
Implantable Lead Location: 753859
Implantable Pulse Generator Implant Date: 20170628
Lead Channel Impedance Value: 540 Ohm
Lead Channel Pacing Threshold Amplitude: 0.5 V
Lead Channel Pacing Threshold Pulse Width: 0.4 ms
Lead Channel Sensing Intrinsic Amplitude: 6.8 mV
Lead Channel Setting Pacing Amplitude: 2.5 V
Lead Channel Setting Sensing Sensitivity: 2 mV
MDC IDC LEAD IMPLANT DT: 20170628
MDC IDC LEAD LOCATION: 753860
MDC IDC MSMT BATTERY REMAINING PERCENTAGE: 95.5 %
MDC IDC MSMT LEADCHNL RA IMPEDANCE VALUE: 490 Ohm
MDC IDC MSMT LEADCHNL RA SENSING INTR AMPL: 1.5 mV
MDC IDC MSMT LEADCHNL RV PACING THRESHOLD AMPLITUDE: 1 V
MDC IDC MSMT LEADCHNL RV PACING THRESHOLD PULSEWIDTH: 0.4 ms
MDC IDC SESS DTM: 20190701174605
MDC IDC SET LEADCHNL RA PACING AMPLITUDE: 2 V
MDC IDC SET LEADCHNL RV PACING PULSEWIDTH: 0.4 ms
MDC IDC STAT BRADY AP VS PERCENT: 77 %
MDC IDC STAT BRADY AS VS PERCENT: 20 %
MDC IDC STAT BRADY RV PERCENT PACED: 2.7 %
Pulse Gen Model: 2272
Pulse Gen Serial Number: 7910313

## 2018-07-10 DIAGNOSIS — M545 Low back pain: Secondary | ICD-10-CM | POA: Diagnosis not present

## 2018-07-15 DIAGNOSIS — M545 Low back pain: Secondary | ICD-10-CM | POA: Diagnosis not present

## 2018-07-17 DIAGNOSIS — M545 Low back pain: Secondary | ICD-10-CM | POA: Diagnosis not present

## 2018-07-22 DIAGNOSIS — M545 Low back pain: Secondary | ICD-10-CM | POA: Diagnosis not present

## 2018-07-24 DIAGNOSIS — M545 Low back pain: Secondary | ICD-10-CM | POA: Diagnosis not present

## 2018-07-29 ENCOUNTER — Other Ambulatory Visit: Payer: Self-pay | Admitting: Internal Medicine

## 2018-08-01 DIAGNOSIS — H01004 Unspecified blepharitis left upper eyelid: Secondary | ICD-10-CM | POA: Diagnosis not present

## 2018-08-01 DIAGNOSIS — H524 Presbyopia: Secondary | ICD-10-CM | POA: Diagnosis not present

## 2018-08-01 DIAGNOSIS — H01001 Unspecified blepharitis right upper eyelid: Secondary | ICD-10-CM | POA: Diagnosis not present

## 2018-08-01 DIAGNOSIS — H01002 Unspecified blepharitis right lower eyelid: Secondary | ICD-10-CM | POA: Diagnosis not present

## 2018-08-01 DIAGNOSIS — H40013 Open angle with borderline findings, low risk, bilateral: Secondary | ICD-10-CM | POA: Diagnosis not present

## 2018-08-28 ENCOUNTER — Other Ambulatory Visit: Payer: Self-pay | Admitting: Internal Medicine

## 2018-09-02 ENCOUNTER — Ambulatory Visit (INDEPENDENT_AMBULATORY_CARE_PROVIDER_SITE_OTHER): Payer: Medicare Other | Admitting: Physician Assistant

## 2018-09-02 ENCOUNTER — Encounter (INDEPENDENT_AMBULATORY_CARE_PROVIDER_SITE_OTHER): Payer: Self-pay | Admitting: Physician Assistant

## 2018-09-02 DIAGNOSIS — M65311 Trigger thumb, right thumb: Secondary | ICD-10-CM | POA: Diagnosis not present

## 2018-09-02 NOTE — Progress Notes (Signed)
HPI: [name] returns today follow-up of her right thumb.  She states the trigger finger injection helped some.  She is been using a Band-Aid which seems to help some.  She states she is anxious that the finger may trigger on her.  She is wondering what else can be done.  Right hand is her dominant hand and she likes to write a lot.  In regards to her back she feels physical therapy is definitely helped.  She has no complaints in regards to the back today.  Right hand: Palpable tender nodule at the A1 pulley.  She has slight triggering right thumb.  No rashes skin lesions ulcerations.  No other triggering fingers of the right hand.  Sensation grossly intact.  Impression: Right trigger thumb  Plan: Discussed with patient other conservative measures which would include topical anti-inflammatories and splinting versus repeat injections versus right trigger thumb release.  She would like to proceed with a right trigger thumb release near future.  She is on Eliquis she would not need to stop this prior to surgery.  This would be done under a MAC with local anesthesia.  She will follow-up with [place] 2 weeks postop.  Questions were encouraged and answered at length.

## 2018-09-03 ENCOUNTER — Encounter (INDEPENDENT_AMBULATORY_CARE_PROVIDER_SITE_OTHER): Payer: Self-pay

## 2018-09-03 ENCOUNTER — Telehealth (INDEPENDENT_AMBULATORY_CARE_PROVIDER_SITE_OTHER): Payer: Self-pay | Admitting: Physician Assistant

## 2018-09-03 NOTE — Telephone Encounter (Signed)
Patient aware this note was re-written for her

## 2018-09-03 NOTE — Telephone Encounter (Signed)
Patient had an appointment yesterday and said she left the note written for her in the room - it was to freeze her from exercise at sport time until further notice. She would like to pick this up today. # [phone]

## 2018-09-04 ENCOUNTER — Encounter: Payer: Self-pay | Admitting: Internal Medicine

## 2018-09-04 ENCOUNTER — Ambulatory Visit: Payer: Medicare Other | Admitting: Internal Medicine

## 2018-09-04 VITALS — BP 122/84 | HR 66 | Ht 63.0 in | Wt 183.8 lb

## 2018-09-04 DIAGNOSIS — I10 Essential (primary) hypertension: Secondary | ICD-10-CM

## 2018-09-04 DIAGNOSIS — R001 Bradycardia, unspecified: Secondary | ICD-10-CM | POA: Diagnosis not present

## 2018-09-04 DIAGNOSIS — I480 Paroxysmal atrial fibrillation: Secondary | ICD-10-CM | POA: Diagnosis not present

## 2018-09-04 DIAGNOSIS — Z950 Presence of cardiac pacemaker: Secondary | ICD-10-CM

## 2018-09-04 NOTE — Progress Notes (Signed)
HPI [name] returns today for followup. She is a pleasant 84 yo woman with PAF, HTN, and sinus node dysfunction. She underwent PPM insertion over a year ago for symptomatic sinus node dysfunction. She also had atrial fib with a very rapid VR. She has done well in the interim. No chest pain or sob. No syncope. She is active.  Allergies  Allergen Reactions  . Advil [Ibuprofen]     GI upset  . Aleve [Naproxen Sodium]     GI upset  . Aspirin Nausea And Vomiting    Takes baby aspirin qod without problems  . Atorvastatin     Leg cramps  . Celebrex [Celecoxib]     GI upset  . Diclofenac     GI upset  . Doxycycline     Headache  . Metoclopramide Hcl     REACTION: heart palps  . Nsaids Other (See Comments)    Tears my stomach up   . Other     Antihistamine - increase BP  . Pravastatin     Leg cramps  . Timolol     Dropped HR     Current Outpatient Medications  Medication Sig Dispense Refill  . acetaminophen (TYLENOL) 500 MG tablet Take 500 mg by mouth every 6 (six) hours as needed for mild pain.     Marland Kitchen ALPRAZolam (XANAX) 0.25 MG tablet Take one tablet by mouth at bedtime as needed for rest or anxiety 30 tablet 1  . antiseptic oral rinse (BIOTENE) LIQD 15 mLs by Mouth Rinse route daily as needed for dry mouth.    . bimatoprost (LUMIGAN) 0.01 % SOLN Instill 1 drop into both eyes once daily in the evening    . BIOTIN 5000 PO Take 1 capsule by mouth daily.    . Cholecalciferol (VITAMIN D) 2000 UNITS tablet Take 2,000 Units by mouth daily.    . dorzolamide (TRUSOPT) 2 % ophthalmic solution Place 1 drop into both eyes 3 (three) times daily.     Marland Kitchen ELIQUIS 5 MG TABS tablet TAKE ONE TABLET BY MOUTH TWICE DAILY FOR ANTICOAGULATION 180 tablet 1  . esomeprazole (NEXIUM) 20 MG capsule TAKE 1 CAPSULE BY MOUTH EVERY DAY 30 capsule 2  . ezetimibe (ZETIA) 10 MG tablet Take 1 tablet (10 mg total) by mouth daily. 90 tablet 3  . Ketoprofen 10 % CREA Apply 1 application topically daily as  needed (pain). 20% cream; PRN for pain. 30 mL 2  . methylcellulose (ARTIFICIAL TEARS) 1 % ophthalmic solution Place 1 drop into both eyes daily as needed (for dry eyes).     . metoprolol tartrate (LOPRESSOR) 50 MG tablet TAKE 1 TABLET BY MOUTH TWICE A DAY 60 tablet 5  . propafenone (RYTHMOL SR) 225 MG 12 hr capsule TAKE 1 CAPSULE (225 MG TOTAL) BY MOUTH 2 (TWO) TIMES DAILY. 60 capsule 8  . traZODone (DESYREL) 50 MG tablet TAKE 1 TABLET BY MOUTH EVERY NIGHT TO HELP WITH SLEEP 30 tablet 5  . valsartan-hydrochlorothiazide (DIOVAN-HCT) 320-25 MG tablet TAKE 1 TABLET BY MOUTH EVERY DAY 30 tablet 2   No current facility-administered medications for this visit.      Past Medical History:  Diagnosis Date  . Allergic rhinitis due to pollen   . Anxiety state, unspecified   . Arthritis    "right knee" (06/21/2016)  . Asymptomatic varicose veins   . Carpal tunnel syndrome   . Chest pain, unspecified   . Chronic lower back pain    "  on the left side" (06/21/2016)  . Cramp of limb   . Depressive disorder, not elsewhere classified   . Diaphragmatic hernia without mention of obstruction or gangrene   . Diaphragmatic hernia without mention of obstruction or gangrene   . Disturbance of skin sensation   . Diverticulosis of colon (without mention of hemorrhage)   . Enthesopathy of hip region   . Esophageal reflux   . Hepatitis, unspecified   . History of blood transfusion    "w/both knee replacements"  . History of duodenal ulcer   . Insomnia, unspecified   . Lumbago   . Migraine    "none since ~ 1990" (06/21/2016)  . Myalgia and myositis, unspecified   . Obesity, unspecified   . Other abnormal blood chemistry   . Other dysphagia   . Other malaise and fatigue   . Other nonspecific abnormal serum enzyme levels   . Other specified cardiac dysrhythmias(427.89)   . Pacemaker   . Pain in joint, ankle and foot   . Pain in joint, hand   . Pain in joint, lower leg   . Pain in joint, pelvic region  and thigh   . Pain in limb   . Plantar fascial fibromatosis   . Presence of permanent cardiac pacemaker   . Reflux esophagitis   . Sciatica   . Spinal stenosis, unspecified region other than cervical   . Stricture and stenosis of esophagus   . Symptomatic menopausal or female climacteric states   . Unspecified essential hypertension   . Unspecified essential hypertension   . Unspecified vitamin D deficiency     ROS:   All systems reviewed and negative except as noted in the HPI.   Past Surgical History:  Procedure Laterality Date  . BREAST BIOPSY Left 1990s X 2  . CARDIOVERSION N/A 04/26/2016   Procedure: CARDIOVERSION;  Surgeon: Pixie Casino, MD;  Location: [facility];  Service: Cardiovascular;  Laterality: N/A;  . CATARACT EXTRACTION W/ INTRAOCULAR LENS IMPLANT Left 08/03/1999   DR EPES   . CATARACT EXTRACTION W/ INTRAOCULAR LENS IMPLANT Right 2000   DR EPES  . [place]  . COLONOSCOPY  1988   Normal   . DENTAL SURGERY Left 08/2016  . EP IMPLANTABLE DEVICE N/A 06/21/2016   Procedure: Pacemaker Implant;  Surgeon: Evans Lance, MD;  Location: [facility];  Service: Cardiovascular;  Laterality: N/A;  . ESOPHAGOGASTRODUODENOSCOPY (EGD) WITH ESOPHAGEAL DILATION  ~ 1982   Dr. Sharlett Iles  . EXCISION OF ACTINIC KERATOSIS     DR LUPTON   . EYE SURGERY    . INSERT / REPLACE / REMOVE PACEMAKER    . JOINT REPLACEMENT    . KNEE ARTHROSCOPY Left 2003  . KNEE ARTHROSCOPY Right 06-26-13  . KNEE CLOSED REDUCTION Right 10/23/2013   Procedure: CLOSED MANIPULATION RIGHT KNEE;  Surgeon: Mcarthur Rossetti, MD;  Location: [facility];  Service: Orthopedics;  Laterality: Right;  . LASER FOR CLOUDY CAP LEFT EYE Left 03/2006   DR DIGBY  . TOTAL KNEE ARTHROPLASTY Left 04/2004   DR RENDALL  . TOTAL KNEE ARTHROPLASTY Right 07/04/2013   Procedure: RIGHT TOTAL KNEE ARTHROPLASTY;  Surgeon: Mcarthur Rossetti, MD;  Location: [facility];  Service: Orthopedics;   Laterality: Right;  Marland Kitchen VAGINAL HYSTERECTOMY  1979     Family History  Problem Relation Age of Onset  . Ovarian cancer Mother   . Uterine cancer Mother   . Cerebrovascular Accident Mother   .  Heart disease Father   . Hypertension Brother   . Obesity Daughter      Social History   Socioeconomic History  . Marital status: Married    Spouse name: Not on file  . Number of children: Not on file  . Years of education: Not on file  . Highest education level: Not on file  Occupational History  . Not on file  Social Needs  . Financial resource strain: Not hard at all  . Food insecurity:    Worry: Never true    Inability: Never true  . Transportation needs:    Medical: No    Non-medical: No  Tobacco Use  . Smoking status: Never Smoker  . Smokeless tobacco: Never Used  Substance and Sexual Activity  . Alcohol use: No  . Drug use: No  . Sexual activity: Yes  Lifestyle  . Physical activity:    Days per week: 5 days    Minutes per session: 30 min  . Stress: To some extent  Relationships  . Social connections:    Talks on phone: Once a week    Gets together: More than three times a week    Attends religious service: 1 to 4 times per year    Active member of club or organization: Yes    Attends meetings of clubs or organizations: 1 to 4 times per year    Relationship status: Married  . Intimate partner violence:    Fear of current or ex partner: No    Emotionally abused: No    Physically abused: No    Forced sexual activity: No  Other Topics Concern  . Not on file  Social History Narrative  . Not on file     BP 122/84   Pulse 66   Ht 5\' 3"  (1.6 m)   Wt 183 lb 12.8 oz (83.4 kg)   SpO2 94%   BMI 32.56 kg/m   Physical Exam:  Well appearing NAD HEENT: Unremarkable Neck:  No JVD, no thyromegally Lymphatics:  No adenopathy Back:  No CVA tenderness Lungs:  Clear HEART:  Regular rate rhythm, no murmurs, no rubs, no clicks Abd:  soft, positive bowel sounds, no  organomegally, no rebound, no guarding Ext:  2 plus pulses, no edema, no cyanosis, no clubbing Skin:  No rashes no nodules Neuro:  CN II through XII intact, motor grossly intact  EKG - nsr with atrial pacing  DEVICE  Normal device function.  See PaceArt for details.   Assess/Plan: 1. Sinus node dysfunction - she is asymptomatic, s/p PPM. Her underlying rate today is less than 30/min. 2. PAF - she is maintaining NSR 80% of the time.  3. HTN - her blood pressure is well controlled. No change in her meds.  4. PPM - her [employer] DDD PM is working normally. We will recheck in several months.  [name].D.

## 2018-09-04 NOTE — Patient Instructions (Addendum)
Medication Instructions:  Your physician recommends that you continue on your current medications as directed. Please refer to the Current Medication list given to you today.  Labwork: None ordered.  Testing/Procedures: None ordered.  Follow-Up: Your physician wants you to follow-up in:  one year with Dr. Lovena Le.   You will receive a reminder letter in the mail two months in advance. If you don't receive a letter, please call our office to schedule the follow-up appointment.  Remote monitoring is used to monitor your Pacemaker from home. This monitoring reduces the number of office visits required to check your device to one time per year. It allows [place] to keep an eye on the functioning of your device to ensure it is working properly. You are scheduled for a device check from home on 09/23/2018. You may send your transmission at any time that day. If you have a wireless device, the transmission will be sent automatically. After your physician reviews your transmission, you will receive a postcard with your next transmission date.  Any Other Special Instructions Will Be Listed Below (If Applicable).  If you need a refill on your cardiac medications before your next appointment, please call your pharmacy.

## 2018-09-23 ENCOUNTER — Ambulatory Visit (INDEPENDENT_AMBULATORY_CARE_PROVIDER_SITE_OTHER): Payer: Medicare Other | Admitting: *Deleted

## 2018-09-23 DIAGNOSIS — R001 Bradycardia, unspecified: Secondary | ICD-10-CM

## 2018-09-23 DIAGNOSIS — I495 Sick sinus syndrome: Secondary | ICD-10-CM

## 2018-09-23 LAB — CUP PACEART INCLINIC DEVICE CHECK
Brady Statistic RA Percent Paced: 79 %
Brady Statistic RV Percent Paced: 2.4 %
Implantable Lead Implant Date: 20170628
Implantable Lead Implant Date: 20170628
Implantable Lead Location: 753859
Lead Channel Impedance Value: 525 Ohm
Lead Channel Impedance Value: 537.5 Ohm
Lead Channel Pacing Threshold Amplitude: 0.5 V
Lead Channel Pacing Threshold Amplitude: 1 V
Lead Channel Pacing Threshold Pulse Width: 0.4 ms
Lead Channel Pacing Threshold Pulse Width: 0.4 ms
Lead Channel Sensing Intrinsic Amplitude: 1.8 mV
Lead Channel Sensing Intrinsic Amplitude: 6 mV
Lead Channel Setting Sensing Sensitivity: 2 mV
MDC IDC LEAD LOCATION: 753860
MDC IDC MSMT BATTERY REMAINING LONGEVITY: 124 mo
MDC IDC MSMT BATTERY VOLTAGE: 2.99 V
MDC IDC PG IMPLANT DT: 20170628
MDC IDC SESS DTM: 20190911193848
MDC IDC SET LEADCHNL RA PACING AMPLITUDE: 2 V
MDC IDC SET LEADCHNL RV PACING AMPLITUDE: 2.5 V
MDC IDC SET LEADCHNL RV PACING PULSEWIDTH: 0.4 ms
Pulse Gen Model: 2272
Pulse Gen Serial Number: 7910313

## 2018-09-23 NOTE — Progress Notes (Signed)
Remote pacemaker transmission.   

## 2018-09-24 ENCOUNTER — Other Ambulatory Visit: Payer: Self-pay | Admitting: Nurse Practitioner

## 2018-09-24 DIAGNOSIS — I4819 Other persistent atrial fibrillation: Secondary | ICD-10-CM

## 2018-09-24 HISTORY — PX: TRIGGER FINGER RELEASE: SHX641

## 2018-09-26 ENCOUNTER — Encounter: Payer: Self-pay | Admitting: Orthopaedic Surgery

## 2018-09-26 DIAGNOSIS — M65311 Trigger thumb, right thumb: Secondary | ICD-10-CM | POA: Diagnosis not present

## 2018-10-02 ENCOUNTER — Encounter: Payer: Self-pay | Admitting: Nurse Practitioner

## 2018-10-02 ENCOUNTER — Ambulatory Visit: Payer: Medicare Other | Admitting: Nurse Practitioner

## 2018-10-02 VITALS — BP 114/78 | HR 62 | Temp 98.4°F | Wt 186.4 lb

## 2018-10-02 DIAGNOSIS — E2839 Other primary ovarian failure: Secondary | ICD-10-CM

## 2018-10-02 DIAGNOSIS — Z6832 Body mass index (BMI) 32.0-32.9, adult: Secondary | ICD-10-CM

## 2018-10-02 DIAGNOSIS — E6609 Other obesity due to excess calories: Secondary | ICD-10-CM

## 2018-10-02 DIAGNOSIS — M48062 Spinal stenosis, lumbar region with neurogenic claudication: Secondary | ICD-10-CM | POA: Diagnosis not present

## 2018-10-02 DIAGNOSIS — G4700 Insomnia, unspecified: Secondary | ICD-10-CM

## 2018-10-02 DIAGNOSIS — I4819 Other persistent atrial fibrillation: Secondary | ICD-10-CM

## 2018-10-02 DIAGNOSIS — K5900 Constipation, unspecified: Secondary | ICD-10-CM

## 2018-10-02 DIAGNOSIS — K219 Gastro-esophageal reflux disease without esophagitis: Secondary | ICD-10-CM

## 2018-10-02 DIAGNOSIS — Z23 Encounter for immunization: Secondary | ICD-10-CM | POA: Diagnosis not present

## 2018-10-02 LAB — CBC WITH DIFFERENTIAL/PLATELET
Basophils Absolute: 49 cells/uL (ref 0–200)
Basophils Relative: 0.8 %
EOS ABS: 122 {cells}/uL (ref 15–500)
Eosinophils Relative: 2 %
HCT: 36.6 % (ref 35.0–45.0)
Hemoglobin: 12.4 g/dL (ref 11.7–15.5)
LYMPHS ABS: 2202 {cells}/uL (ref 850–3900)
MCH: 30.8 pg (ref 27.0–33.0)
MCHC: 33.9 g/dL (ref 32.0–36.0)
MCV: 91 fL (ref 80.0–100.0)
MPV: 10.2 fL (ref 7.5–12.5)
Monocytes Relative: 8.4 %
NEUTROS PCT: 52.7 %
Neutro Abs: 3215 cells/uL (ref 1500–7800)
PLATELETS: 171 10*3/uL (ref 140–400)
RBC: 4.02 10*6/uL (ref 3.80–5.10)
RDW: 12.6 % (ref 11.0–15.0)
TOTAL LYMPHOCYTE: 36.1 %
WBC: 6.1 10*3/uL (ref 3.8–10.8)
WBCMIX: 512 {cells}/uL (ref 200–950)

## 2018-10-02 LAB — BASIC METABOLIC PANEL WITH GFR
BUN: 14 mg/dL (ref 7–25)
CO2: 31 mmol/L (ref 20–32)
CREATININE: 0.83 mg/dL (ref 0.60–0.88)
Calcium: 8.8 mg/dL (ref 8.6–10.4)
Chloride: 101 mmol/L (ref 98–110)
GFR, Est African American: 75 mL/min/{1.73_m2} (ref >=60)
GFR, Est Non African American: 64 mL/min/{1.73_m2} (ref >=60)
GLUCOSE: 87 mg/dL (ref 65–139)
Potassium: 3.8 mmol/L (ref 3.5–5.3)
SODIUM: 135 mmol/L (ref 135–146)

## 2018-10-02 MED ORDER — APIXABAN 5 MG PO TABS: 5.0000 mg | ORAL_TABLET | Freq: Two times a day (BID) | ORAL | 1 refills | Status: DC

## 2018-10-02 MED ORDER — TRAZODONE HCL 50 MG PO TABS: ORAL_TABLET | ORAL | 5 refills | Status: DC

## 2018-10-02 NOTE — Progress Notes (Signed)
Careteam: Patient Care Team: Lauree Chandler, NP as PCP - General (Geriatric Medicine) Mcarthur Rossetti, MD as Attending Physician (Orthopedic Surgery) Larey Dresser, MD as Attending Physician (Cardiology) Thornell Sartorius, MD as Consulting Physician (Otolaryngology) Druscilla Brownie, MD as Consulting Physician (Dermatology) Calvert Cantor, MD as Consulting Physician (Ophthalmology) Debara Pickett Nadean Corwin, MD as Consulting Physician (Cardiology)  Advanced Directive information    Allergies  Allergen Reactions  . Advil [Ibuprofen]     GI upset  . Aleve [Naproxen Sodium]     GI upset  . Aspirin Nausea And Vomiting    Takes baby aspirin qod without problems  . Atorvastatin     Leg cramps  . Celebrex [Celecoxib]     GI upset  . Diclofenac     GI upset  . Doxycycline     Headache  . Metoclopramide Hcl     REACTION: heart palps  . Nsaids Other (See Comments)    Tears my stomach up   . Other     Antihistamine - increase BP  . Pravastatin     Leg cramps  . Timolol     Dropped HR    Chief Complaint  Patient presents with  . Medical Management of Chronic Issues    Patient returns to the office for follow up. She is due for a bone density. She feels fine. She would like her throat looked, its a bit sore. A week ago she had surgery on her right thrumb (trigger finger).      HPI: Patient is a 84 y.o. female seen in the office today for routine follow up. Pt with hx of PAF, HTN, sinus node dysfunction s/p PPM, spinal stenosis of lumbar region.   GERD- controlled, uses esomeprazole as needed   Insomnia- using trazodone which has been effective.   Recently had trigger finger noted in right thumb and it was injected in July and exercises but continued to trigger which was painful therefore she had outpatient procedure to correct this on 09/26/18, moving hand better but still a little swollen. Pain has improved. Has follow up in 1 week.   Spinal stenosis- had 4  weeks of PT which helped pain. Doing exercise 3 times a week.   afib- following with cardiology, continues on lopressor and propafenone for rate controlled   Constipation- drinks prune juice, takes Senakot twice a week and stool softener 5 times a week.    Review of Systems:  Review of Systems  Constitutional: Negative for chills, fever and weight loss.  HENT: Negative for tinnitus.   Respiratory: Negative for cough, sputum production and shortness of breath.   Cardiovascular: Negative for chest pain, palpitations and leg swelling.  Gastrointestinal: Positive for constipation and heartburn (with diet and nexium). Negative for abdominal pain and diarrhea.  Genitourinary: Negative for dysuria, frequency and urgency.  Musculoskeletal: Positive for back pain (improved with PT). Negative for falls, joint pain and myalgias.  Skin: Negative.   Neurological: Negative for dizziness and headaches.  Psychiatric/Behavioral: Negative for depression and memory loss. The patient does not have insomnia.     Past Medical History:  Diagnosis Date  . Allergic rhinitis due to pollen   . Anxiety state, unspecified   . Arthritis    "right knee" (06/21/2016)  . Asymptomatic varicose veins   . Carpal tunnel syndrome   . Chest pain, unspecified   . Chronic lower back pain    "on the left side" (06/21/2016)  . Cramp of limb   .  Depressive disorder, not elsewhere classified   . Diaphragmatic hernia without mention of obstruction or gangrene   . Diaphragmatic hernia without mention of obstruction or gangrene   . Disturbance of skin sensation   . Diverticulosis of colon (without mention of hemorrhage)   . Enthesopathy of hip region   . Esophageal reflux   . Hepatitis, unspecified   . History of blood transfusion    "w/both knee replacements"  . History of duodenal ulcer   . Insomnia, unspecified   . Lumbago   . Migraine    "none since ~ 1990" (06/21/2016)  . Myalgia and myositis, unspecified   .  Obesity, unspecified   . Other abnormal blood chemistry   . Other dysphagia   . Other malaise and fatigue   . Other nonspecific abnormal serum enzyme levels   . Other specified cardiac dysrhythmias(427.89)   . Pacemaker   . Pain in joint, ankle and foot   . Pain in joint, hand   . Pain in joint, lower leg   . Pain in joint, pelvic region and thigh   . Pain in limb   . Plantar fascial fibromatosis   . Presence of permanent cardiac pacemaker   . Reflux esophagitis   . Sciatica   . Spinal stenosis, unspecified region other than cervical   . Stricture and stenosis of esophagus   . Symptomatic menopausal or female climacteric states   . Unspecified essential hypertension   . Unspecified essential hypertension   . Unspecified vitamin D deficiency    Past Surgical History:  Procedure Laterality Date  . BREAST BIOPSY Left 1990s X 2  . CARDIOVERSION N/A 04/26/2016   Procedure: CARDIOVERSION;  Surgeon: Pixie Casino, MD;  Location: [facility];  Service: Cardiovascular;  Laterality: N/A;  . CATARACT EXTRACTION W/ INTRAOCULAR LENS IMPLANT Left 08/03/1999   DR EPES   . CATARACT EXTRACTION W/ INTRAOCULAR LENS IMPLANT Right 2000   DR EPES  . [place]  . COLONOSCOPY  1988   Normal   . DENTAL SURGERY Left 08/2016  . EP IMPLANTABLE DEVICE N/A 06/21/2016   Procedure: Pacemaker Implant;  Surgeon: Evans Lance, MD;  Location: [facility];  Service: Cardiovascular;  Laterality: N/A;  . ESOPHAGOGASTRODUODENOSCOPY (EGD) WITH ESOPHAGEAL DILATION  ~ 1982   Dr. Sharlett Iles  . EXCISION OF ACTINIC KERATOSIS     DR LUPTON   . EYE SURGERY    . INSERT / REPLACE / REMOVE PACEMAKER    . JOINT REPLACEMENT    . KNEE ARTHROSCOPY Left 2003  . KNEE ARTHROSCOPY Right 06-26-13  . KNEE CLOSED REDUCTION Right 10/23/2013   Procedure: CLOSED MANIPULATION RIGHT KNEE;  Surgeon: Mcarthur Rossetti, MD;  Location: [facility];  Service: Orthopedics;  Laterality: Right;  . LASER FOR  CLOUDY CAP LEFT EYE Left 03/2006   DR DIGBY  . TOTAL KNEE ARTHROPLASTY Left 04/2004   DR RENDALL  . TOTAL KNEE ARTHROPLASTY Right 07/04/2013   Procedure: RIGHT TOTAL KNEE ARTHROPLASTY;  Surgeon: Mcarthur Rossetti, MD;  Location: [facility];  Service: Orthopedics;  Laterality: Right;  . TRIGGER FINGER RELEASE Right   . VAGINAL HYSTERECTOMY  1979   Social History:   reports that she has never smoked. She has never used smokeless tobacco. She reports that she does not drink alcohol or use drugs.  Family History  Problem Relation Age of Onset  . Ovarian cancer Mother   . Uterine cancer Mother   .  Cerebrovascular Accident Mother   . Heart disease Father   . Hypertension Brother   . Obesity Daughter     Medications: Patient's Medications  New Prescriptions   No medications on file  Previous Medications   ACETAMINOPHEN (TYLENOL) 500 MG TABLET    Take 500 mg by mouth every 6 (six) hours as needed for mild pain.    ALPRAZOLAM (XANAX) 0.25 MG TABLET    Take one tablet by mouth at bedtime as needed for rest or anxiety   ANTISEPTIC ORAL RINSE (BIOTENE) LIQD    15 mLs by Mouth Rinse route daily as needed for dry mouth.   BIMATOPROST (LUMIGAN) 0.01 % SOLN    Instill 1 drop into both eyes once daily in the evening   BIOTIN 5000 PO    Take 1 capsule by mouth daily.   CHOLECALCIFEROL (VITAMIN D) 2000 UNITS TABLET    Take 2,000 Units by mouth daily.   DORZOLAMIDE (TRUSOPT) 2 % OPHTHALMIC SOLUTION    Place 1 drop into both eyes 3 (three) times daily.    ELIQUIS 5 MG TABS TABLET    TAKE ONE TABLET BY MOUTH TWICE DAILY FOR ANTICOAGULATION   ESOMEPRAZOLE (NEXIUM) 20 MG CAPSULE    TAKE 1 CAPSULE BY MOUTH EVERY DAY   EZETIMIBE (ZETIA) 10 MG TABLET    Take 1 tablet (10 mg total) by mouth daily.   KETOPROFEN 10 % CREA    Apply 1 application topically daily as needed (pain). 20% cream; PRN for pain.   METHYLCELLULOSE (ARTIFICIAL TEARS) 1 % OPHTHALMIC SOLUTION    Place 1 drop into both eyes daily as needed  (for dry eyes).    METOPROLOL TARTRATE (LOPRESSOR) 50 MG TABLET    TAKE 1 TABLET BY MOUTH TWICE A DAY   PROPAFENONE (RYTHMOL SR) 225 MG 12 HR CAPSULE    TAKE 1 CAPSULE (225 MG TOTAL) BY MOUTH 2 (TWO) TIMES DAILY.   TRAZODONE (DESYREL) 50 MG TABLET    TAKE 1 TABLET BY MOUTH EVERY NIGHT TO HELP WITH SLEEP   VALSARTAN-HYDROCHLOROTHIAZIDE (DIOVAN-HCT) 320-25 MG TABLET    TAKE 1 TABLET BY MOUTH EVERY DAY  Modified Medications   No medications on file  Discontinued Medications   No medications on file     Physical Exam:  Vitals:   10/02/18 1302  BP: 114/78  Pulse: 62  Temp: 98.4 F (36.9 C)  SpO2: 97%  Weight: 186 lb 6.4 oz (84.6 kg)   Body mass index is 33.02 kg/m.  Physical Exam  Constitutional: She is oriented to person, place, and time. She appears well-developed and well-nourished. No distress.  Obese.  HENT:  Head: Normocephalic and atraumatic.  Right Ear: External ear normal.  Left Ear: External ear normal.  Nose: Nose normal.  Mouth/Throat: Oropharynx is clear and moist. No oropharyngeal exudate.  Eyes: Pupils are equal, round, and reactive to light. Conjunctivae and EOM are normal. Left eye exhibits no discharge. No scleral icterus.  Neck: Normal range of motion. Neck supple.  Cardiovascular: Normal rate, regular rhythm and normal heart sounds.  Pulmonary/Chest: Effort normal and breath sounds normal.  Pacemaker in left upper chest wall.  Abdominal: Soft. Bowel sounds are normal. She exhibits no distension. There is no tenderness.  Musculoskeletal: She exhibits no edema.  Neurological: She is alert and oriented to person, place, and time.  Skin: Skin is warm and dry.  Psychiatric: She has a normal mood and affect. Her behavior is normal. Judgment and thought content normal.  Labs reviewed: Basic Metabolic Panel: Recent Labs    01/11/18 0955 05/07/18 0925  NA 139 139  K 3.9 3.5  CL 105 103  CO2 28 31  GLUCOSE 92 99  BUN 18 16  CREATININE 0.84 0.69    CALCIUM 9.1 8.9   Liver Function Tests: Recent Labs    01/11/18 0955 05/07/18 0925  AST 17 22  ALT 17 23  BILITOT 0.5 0.5  PROT 6.5 6.5   No results for input(s): LIPASE, AMYLASE in the last 8760 hours. No results for input(s): AMMONIA in the last 8760 hours. CBC: Recent Labs    01/11/18 0955 05/07/18 0925  WBC 4.4 4.6  NEUTROABS 2,306 2,047  HGB 12.5 12.6  HCT 36.2 35.9  MCV 90.5 89.3  PLT 164 180   Lipid Panel: Recent Labs    01/11/18 0955 05/07/18 0925  CHOL 229* 151  HDL 42* 39*  LDLCALC 159* 85  TRIG 152* 160*  CHOLHDL 5.5* 3.9   TSH: No results for input(s): TSH in the last 8760 hours. A1C: Lab Results  Component Value Date   HGBA1C 5.2 08/06/2017     Assessment/Plan 1. Estrogen deficiency - DG Bone Density; Future  2. Need for influenza vaccination - Flu vaccine HIGH DOSE PF (Fluzone High dose)  3. atrial fibrillation Rate controlled, continues on eliquis for anticoagulation without signs of bleeding.  - apixaban (ELIQUIS) 5 MG TABS tablet; Take 1 tablet (5 mg total) by mouth 2 (two) times daily.  Dispense: 180 tablet; Refill: 1 - BASIC METABOLIC PANEL WITH GFR - CBC with Differential/Platelets  4. Insomnia, unspecified type Controlled on trazodone.  - traZODone (DESYREL) 50 MG tablet; TAKE 1 TABLET BY MOUTH EVERY NIGHT TO HELP WITH SLEEP  Dispense: 30 tablet; Refill: 5  5. Spinal stenosis of lumbar region with neurogenic claudication Stable, Has done well with PT.   6. Class 1 obesity due to excess calories with serious comorbidity and body mass index (BMI) of 32.0 to 32.9 in adult Discussed healthy lifestyle with diet modifications and exercise.   7. Constipation, unspecified constipation type -increase fiber and water. Okay to use Senakot daily at this time. Continue softener and prune juice   8. Gastroesophageal reflux disease without esophagitis Controlled on diet and uses Nexium occasionally with good results.   Next appt: 6  months with fasting labs prior to visit.  Carlos American. [place], [facility] [phone]

## 2018-10-02 NOTE — Patient Instructions (Signed)
benefiber- tasteless, colorless, not thick - to increase fiber Increase fluid intake Okay to use senakot daily

## 2018-10-10 ENCOUNTER — Encounter (INDEPENDENT_AMBULATORY_CARE_PROVIDER_SITE_OTHER): Payer: Self-pay | Admitting: Orthopaedic Surgery

## 2018-10-10 ENCOUNTER — Ambulatory Visit (INDEPENDENT_AMBULATORY_CARE_PROVIDER_SITE_OTHER): Payer: Medicare Other | Admitting: Orthopaedic Surgery

## 2018-10-10 DIAGNOSIS — M65311 Trigger thumb, right thumb: Secondary | ICD-10-CM

## 2018-10-10 NOTE — Progress Notes (Signed)
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  She still having pain in her hand and problems with her dexterity coordination and grip general.  The sutures been removed from her thumb.  She looks good overall in her right hand but I agree her dexterity and strength is less.  She would benefit from a certified hand therapist and outpatient hand therapy.  I showed her some exercises and have recommended she get a ball that she can squeeze.  I will see her back in 4 weeks see how she is doing overall.  I did give her a prescription for outpatient physical therapy for her hand.

## 2018-10-11 NOTE — Progress Notes (Signed)
Cardiology Office Note   Date:  10/14/2018   ID:  [name], DOB 1931-01-05, MRN [id number]  PCP:  Lauree Chandler, NP  Cardiologist:   Fue Cervenka Martinique, MD   Chief Complaint  Patient presents with  . Atrial Fibrillation  . Hypertension      History of Present Illness: [name] is a 85 y.o. female is seen for follow up Afib/flutter. She has a long history of marked sinus bradycardia. She was seen initially in 2014. Echo at that time showed mild LAE otherwise normal. Holter showed mean HR 59 with lowest HR 43 and peak HR 109.    On March 9,2017 she noted an increased HR by BP monitor up to 153. At this time she felt marked indigestion from her waist to her neck. She felt her heart fluttering.   She was seen by Dr. Nyoka Cowden on 03/29/16 and found to be in Afib with RVR. She was started on Eliquis and metoprolol. Myoview study and Echo were normal.  She later had attempt at DCCV. She was in an atypical atrial flutter at that time. DCCV resulted in very prolonged pauses > 6 seconds and return to flutter. She was seen by Dr. Curt Bears and placed on flecainide. This did convert her to NSR but made her feel very sick with nausea, dizziness, and extreme fatigue. Flecainide was discontinued.. She continued to have marked bradycardia and underwent PPM placement on 06/21/16. When seen in September 2018 by Dr. Lovena Le she had an Afib burden of 94%. She was started on propafenone for her Afib. Initially this medication caused her to have more nausea but this has improved.  On last pacer check in September 2019 this had decreased to 20%. Rate was controlled.    Since her last visit she did have a trigger finger/thumb operation without difficulty. Her Eliquis was not interrupted.  She is doing well from a cardiac standpoint. She denies any dizziness, chest tightness, or dyspnea.  She is walking at Sport time regularly. She does complain of some hair loss.    Past Medical History:  Diagnosis Date  .  Allergic rhinitis due to pollen   . Anxiety state, unspecified   . Arthritis    "right knee" (06/21/2016)  . Asymptomatic varicose veins   . Carpal tunnel syndrome   . Chest pain, unspecified   . Chronic lower back pain    "on the left side" (06/21/2016)  . Cramp of limb   . Depressive disorder, not elsewhere classified   . Diaphragmatic hernia without mention of obstruction or gangrene   . Diaphragmatic hernia without mention of obstruction or gangrene   . Disturbance of skin sensation   . Diverticulosis of colon (without mention of hemorrhage)   . Enthesopathy of hip region   . Esophageal reflux   . Hepatitis, unspecified   . History of blood transfusion    "w/both knee replacements"  . History of duodenal ulcer   . Insomnia, unspecified   . Lumbago   . Migraine    "none since ~ 1990" (06/21/2016)  . Myalgia and myositis, unspecified   . Obesity, unspecified   . Other abnormal blood chemistry   . Other dysphagia   . Other malaise and fatigue   . Other nonspecific abnormal serum enzyme levels   . Other specified cardiac dysrhythmias(427.89)   . Pacemaker   . Pain in joint, ankle and foot   . Pain in joint, hand   . Pain in joint,  lower leg   . Pain in joint, pelvic region and thigh   . Pain in limb   . Plantar fascial fibromatosis   . Presence of permanent cardiac pacemaker   . Reflux esophagitis   . Sciatica   . Spinal stenosis, unspecified region other than cervical   . Stricture and stenosis of esophagus   . Symptomatic menopausal or female climacteric states   . Unspecified essential hypertension   . Unspecified essential hypertension   . Unspecified vitamin D deficiency     Past Surgical History:  Procedure Laterality Date  . BREAST BIOPSY Left 1990s X 2  . CARDIOVERSION N/A 04/26/2016   Procedure: CARDIOVERSION;  Surgeon: Pixie Casino, MD;  Location: [facility];  Service: Cardiovascular;  Laterality: N/A;  . CATARACT EXTRACTION W/ INTRAOCULAR LENS IMPLANT  Left 08/03/1999   DR EPES   . CATARACT EXTRACTION W/ INTRAOCULAR LENS IMPLANT Right 2000   DR EPES  . [place]  . COLONOSCOPY  1988   Normal   . DENTAL SURGERY Left 08/2016  . EP IMPLANTABLE DEVICE N/A 06/21/2016   Procedure: Pacemaker Implant;  Surgeon: Evans Lance, MD;  Location: [facility];  Service: Cardiovascular;  Laterality: N/A;  . ESOPHAGOGASTRODUODENOSCOPY (EGD) WITH ESOPHAGEAL DILATION  ~ 1982   Dr. Sharlett Iles  . EXCISION OF ACTINIC KERATOSIS     DR LUPTON   . EYE SURGERY    . INSERT / REPLACE / REMOVE PACEMAKER    . JOINT REPLACEMENT    . KNEE ARTHROSCOPY Left 2003  . KNEE ARTHROSCOPY Right 06-26-13  . KNEE CLOSED REDUCTION Right 10/23/2013   Procedure: CLOSED MANIPULATION RIGHT KNEE;  Surgeon: Mcarthur Rossetti, MD;  Location: [facility];  Service: Orthopedics;  Laterality: Right;  . LASER FOR CLOUDY CAP LEFT EYE Left 03/2006   DR DIGBY  . TOTAL KNEE ARTHROPLASTY Left 04/2004   DR RENDALL  . TOTAL KNEE ARTHROPLASTY Right 07/04/2013   Procedure: RIGHT TOTAL KNEE ARTHROPLASTY;  Surgeon: Mcarthur Rossetti, MD;  Location: [facility];  Service: Orthopedics;  Laterality: Right;  . TRIGGER FINGER RELEASE Right   . VAGINAL HYSTERECTOMY  1979     Current Outpatient Medications  Medication Sig Dispense Refill  . acetaminophen (TYLENOL) 500 MG tablet Take 500 mg by mouth every 6 (six) hours as needed for mild pain.     Marland Kitchen ALPRAZolam (XANAX) 0.25 MG tablet Take one tablet by mouth at bedtime as needed for rest or anxiety 30 tablet 1  . antiseptic oral rinse (BIOTENE) LIQD 15 mLs by Mouth Rinse route daily as needed for dry mouth.    Marland Kitchen apixaban (ELIQUIS) 5 MG TABS tablet Take 1 tablet (5 mg total) by mouth 2 (two) times daily. 180 tablet 1  . bimatoprost (LUMIGAN) 0.01 % SOLN Instill 1 drop into both eyes once daily in the evening    . BIOTIN 5000 PO Take 1 capsule by mouth daily.    . Cholecalciferol (VITAMIN D) 2000 UNITS tablet Take  2,000 Units by mouth daily.    . dorzolamide (TRUSOPT) 2 % ophthalmic solution Place 1 drop into both eyes 3 (three) times daily.     Marland Kitchen esomeprazole (NEXIUM) 20 MG capsule TAKE 1 CAPSULE BY MOUTH EVERY DAY 30 capsule 2  . ezetimibe (ZETIA) 10 MG tablet Take 1 tablet (10 mg total) by mouth daily. 90 tablet 3  . Ketoprofen 10 % CREA Apply 1 application topically daily as needed (pain). 20%  cream; PRN for pain. 30 mL 2  . methylcellulose (ARTIFICIAL TEARS) 1 % ophthalmic solution Place 1 drop into both eyes daily as needed (for dry eyes).     . metoprolol tartrate (LOPRESSOR) 50 MG tablet TAKE 1 TABLET BY MOUTH TWICE A DAY 60 tablet 5  . propafenone (RYTHMOL SR) 225 MG 12 hr capsule TAKE 1 CAPSULE (225 MG TOTAL) BY MOUTH 2 (TWO) TIMES DAILY. 60 capsule 8  . traZODone (DESYREL) 50 MG tablet TAKE 1 TABLET BY MOUTH EVERY NIGHT TO HELP WITH SLEEP 30 tablet 5  . valsartan-hydrochlorothiazide (DIOVAN-HCT) 320-25 MG tablet TAKE 1 TABLET BY MOUTH EVERY DAY 30 tablet 2   No current facility-administered medications for this visit.     Allergies:   Advil [ibuprofen]; Aleve [naproxen sodium]; Aspirin; Atorvastatin; Celebrex [celecoxib]; Diclofenac; Doxycycline; Metoclopramide hcl; Nsaids; Other; Pravastatin; and Timolol    Social History:  The patient  reports that she has never smoked. She has never used smokeless tobacco. She reports that she does not drink alcohol or use drugs.   Family History:  The patient's family history includes Cerebrovascular Accident in her mother; Heart disease in her father; Hypertension in her brother; Obesity in her daughter; Ovarian cancer in her mother; Uterine cancer in her mother.    ROS:  Please see the history of present illness.   Otherwise, review of systems are positive for none.   All other systems are reviewed and negative.    PHYSICAL EXAM: VS:  BP 130/74   Pulse 70   Ht 5\' 3"  (1.6 m)   Wt 185 lb 9.6 oz (84.2 kg)   BMI 32.88 kg/m  , BMI Body mass index is  32.88 kg/m. GENERAL:  Well appearing, obese WF in NAD HEENT:  PERRL, EOMI, sclera are clear. Oropharynx is clear. NECK:  No jugular venous distention, carotid upstroke brisk and symmetric, no bruits, no thyromegaly or adenopathy LUNGS:  Clear to auscultation bilaterally CHEST:  Unremarkable HEART:  RRR,  PMI not displaced or sustained,S1 and S2 within normal limits, no S3, no S4: no clicks, no rubs, no murmurs ABD:  Soft, nontender. BS +, no masses or bruits. No hepatomegaly, no splenomegaly EXT:  2 + pulses throughout, no edema, no cyanosis no clubbing. Left thumb in a bandage SKIN:  Warm and dry.  No rashes NEURO:  Alert and oriented x 3. Cranial nerves II through XII intact. PSYCH:  Cognitively intact  EKG:  EKG is not  ordered today.   Recent Labs: 05/07/2018: ALT 23 10/02/2018: BUN 14; Creat 0.83; Hemoglobin 12.4; Platelets 171; Potassium 3.8; Sodium 135    Lipid Panel    Component Value Date/Time   CHOL 151 05/07/2018 0925   CHOL 179 03/27/2016 0853   TRIG 160 (H) 05/07/2018 0925   HDL 39 (L) 05/07/2018 0925   HDL 42 03/27/2016 0853   CHOLHDL 3.9 05/07/2018 0925   VLDL 42 (H) 08/06/2017 0918   LDLCALC 85 05/07/2018 0925      Wt Readings from Last 3 Encounters:  10/14/18 185 lb 9.6 oz (84.2 kg)  10/02/18 186 lb 6.4 oz (84.6 kg)  09/04/18 183 lb 12.8 oz (83.4 kg)      Other studies Reviewed: Additional studies/ records that were reviewed today include:  Echo: 09/06/17: Study Conclusions  - Left ventricle: The cavity size was normal. Wall thickness was   increased in a pattern of mild LVH. Systolic function was normal.   The estimated ejection fraction was in the range of 55%  to 60%.   Doppler parameters are consistent with both elevated ventricular   end-diastolic filling pressure and elevated left atrial filling   pressure. - Left atrium: The atrium was moderately dilated. - Atrial septum: There was increased thickness of the septum,   consistent with  lipomatous hypertrophy. No defect or patent   foramen ovale was identified  ASSESSMENT AND PLAN:  1.  Atrial fibrillation/flutter with RVR. Unsuccessful DCCV with marked conversion pauses. She did convert on flecainide but unable to tolerate this medication. Now s/p PPM placement. Started on propafenone for Afib last year. Afib burden has decreased steadily from 94% to 20%.  She is doing better on medication. Will continue same. Continue Eliquis for anticoagulation.  2. HTN well controlled.  3. Mild hypercholesterolemia.   Disposition: follow up with me in 6 months.   Signed, Everly Rubalcava Martinique, MD,FACC  10/14/2018 3:46 PM    [facility] [address], [place], [place], [address] Phone [phone], Fax [phone]

## 2018-10-14 ENCOUNTER — Ambulatory Visit: Payer: Medicare Other | Admitting: Cardiology

## 2018-10-14 ENCOUNTER — Encounter: Payer: Self-pay | Admitting: Cardiology

## 2018-10-14 VITALS — BP 130/74 | HR 70 | Ht 63.0 in | Wt 185.6 lb

## 2018-10-14 DIAGNOSIS — I480 Paroxysmal atrial fibrillation: Secondary | ICD-10-CM

## 2018-10-14 DIAGNOSIS — I10 Essential (primary) hypertension: Secondary | ICD-10-CM

## 2018-10-14 DIAGNOSIS — E7800 Pure hypercholesterolemia, unspecified: Secondary | ICD-10-CM

## 2018-10-17 LAB — CUP PACEART REMOTE DEVICE CHECK
Battery Remaining Percentage: 95.5 %
Brady Statistic AP VS Percent: 98 %
Brady Statistic AS VP Percent: 1 %
Brady Statistic AS VS Percent: 2 %
Implantable Lead Implant Date: 20170628
Implantable Lead Location: 753860
Lead Channel Impedance Value: 490 Ohm
Lead Channel Pacing Threshold Amplitude: 1 V
Lead Channel Pacing Threshold Pulse Width: 0.4 ms
Lead Channel Sensing Intrinsic Amplitude: 2.2 mV
Lead Channel Sensing Intrinsic Amplitude: 7.3 mV
Lead Channel Setting Pacing Amplitude: 2 V
Lead Channel Setting Pacing Amplitude: 2.5 V
Lead Channel Setting Pacing Pulse Width: 0.4 ms
MDC IDC LEAD IMPLANT DT: 20170628
MDC IDC LEAD LOCATION: 753859
MDC IDC MSMT BATTERY REMAINING LONGEVITY: 122 mo
MDC IDC MSMT BATTERY VOLTAGE: 2.99 V
MDC IDC MSMT LEADCHNL RA PACING THRESHOLD AMPLITUDE: 0.5 V
MDC IDC MSMT LEADCHNL RA PACING THRESHOLD PULSEWIDTH: 0.4 ms
MDC IDC MSMT LEADCHNL RV IMPEDANCE VALUE: 530 Ohm
MDC IDC PG IMPLANT DT: 20170628
MDC IDC SESS DTM: 20190930060015
MDC IDC SET LEADCHNL RV SENSING SENSITIVITY: 2 mV
MDC IDC STAT BRADY AP VP PERCENT: 1 %
MDC IDC STAT BRADY RA PERCENT PACED: 98 %
MDC IDC STAT BRADY RV PERCENT PACED: 1 %
Pulse Gen Model: 2272
Pulse Gen Serial Number: 7910313

## 2018-10-22 ENCOUNTER — Telehealth (INDEPENDENT_AMBULATORY_CARE_PROVIDER_SITE_OTHER): Payer: Self-pay | Admitting: Physical Medicine and Rehabilitation

## 2018-10-22 ENCOUNTER — Telehealth (INDEPENDENT_AMBULATORY_CARE_PROVIDER_SITE_OTHER): Payer: Self-pay | Admitting: Physician Assistant

## 2018-10-22 DIAGNOSIS — M65311 Trigger thumb, right thumb: Secondary | ICD-10-CM | POA: Diagnosis not present

## 2018-10-22 DIAGNOSIS — M25641 Stiffness of right hand, not elsewhere classified: Secondary | ICD-10-CM | POA: Diagnosis not present

## 2018-10-22 DIAGNOSIS — M6281 Muscle weakness (generalized): Secondary | ICD-10-CM | POA: Diagnosis not present

## 2018-10-22 DIAGNOSIS — M25541 Pain in joints of right hand: Secondary | ICD-10-CM | POA: Diagnosis not present

## 2018-10-22 MED ORDER — ACETAMINOPHEN-CODEINE #3 300-30 MG PO TABS: 1.0000 | ORAL_TABLET | Freq: Three times a day (TID) | ORAL | 0 refills | Status: DC | PRN

## 2018-10-22 NOTE — Telephone Encounter (Signed)
Please advise 

## 2018-10-22 NOTE — Telephone Encounter (Signed)
Patient is requesting pain medication be sent to [facility] on [address], she said her leg is in a lot of pain and wants this to hold her over until she sees Dr. Ernestina Patches.   Patients  # [phone]

## 2018-10-22 NOTE — Telephone Encounter (Signed)
Obviously needs to be an office visit

## 2018-10-22 NOTE — Telephone Encounter (Signed)
I sent in some tylenol#3. 

## 2018-10-23 NOTE — Telephone Encounter (Signed)
Scheduled for 11/01/18 at 1030.

## 2018-10-24 ENCOUNTER — Other Ambulatory Visit: Payer: Self-pay | Admitting: Cardiology

## 2018-10-29 DIAGNOSIS — M25541 Pain in joints of right hand: Secondary | ICD-10-CM | POA: Diagnosis not present

## 2018-10-29 DIAGNOSIS — M65311 Trigger thumb, right thumb: Secondary | ICD-10-CM | POA: Diagnosis not present

## 2018-10-29 DIAGNOSIS — M25641 Stiffness of right hand, not elsewhere classified: Secondary | ICD-10-CM | POA: Diagnosis not present

## 2018-10-29 DIAGNOSIS — M6281 Muscle weakness (generalized): Secondary | ICD-10-CM | POA: Diagnosis not present

## 2018-11-01 ENCOUNTER — Telehealth (INDEPENDENT_AMBULATORY_CARE_PROVIDER_SITE_OTHER): Payer: Self-pay | Admitting: Physical Medicine and Rehabilitation

## 2018-11-01 ENCOUNTER — Encounter

## 2018-11-01 ENCOUNTER — Ambulatory Visit (INDEPENDENT_AMBULATORY_CARE_PROVIDER_SITE_OTHER): Payer: Medicare Other | Admitting: Physical Medicine and Rehabilitation

## 2018-11-01 ENCOUNTER — Encounter (INDEPENDENT_AMBULATORY_CARE_PROVIDER_SITE_OTHER): Payer: Self-pay | Admitting: Physical Medicine and Rehabilitation

## 2018-11-01 VITALS — BP 150/76 | HR 62

## 2018-11-01 DIAGNOSIS — M7062 Trochanteric bursitis, left hip: Secondary | ICD-10-CM | POA: Diagnosis not present

## 2018-11-01 DIAGNOSIS — R252 Cramp and spasm: Secondary | ICD-10-CM

## 2018-11-01 DIAGNOSIS — M7061 Trochanteric bursitis, right hip: Secondary | ICD-10-CM

## 2018-11-01 DIAGNOSIS — M48062 Spinal stenosis, lumbar region with neurogenic claudication: Secondary | ICD-10-CM

## 2018-11-01 DIAGNOSIS — M5416 Radiculopathy, lumbar region: Secondary | ICD-10-CM

## 2018-11-01 MED ORDER — BACLOFEN 10 MG PO TABS: ORAL_TABLET | ORAL | 0 refills | Status: DC

## 2018-11-01 NOTE — Telephone Encounter (Signed)
Notification or Prior Authorization is not required for the requested services  This [employer] members plan does not currently require a prior authorization for these services  Decision ID #:[id number]

## 2018-11-01 NOTE — Progress Notes (Signed)
Numeric Pain Rating Scale and Functional Assessment Average Pain 8 Pain Right Now 3 My pain is aching and cramping Pain is worse with: Lying on back or side Pain improves with: therapy/exercise   In the last MONTH (on 0-10 scale) has pain interfered with the following?  1. General activity like being  able to carry out your everyday physical activities such as walking, climbing stairs, carrying groceries, or moving a chair?  Rating(5)  2. Relation with others like being able to carry out your usual social activities and roles such as  activities at home, at work and in your community. Rating(5)  3. Enjoyment of life such that you have  been bothered by emotional problems such as feeling anxious, depressed or irritable?  Rating(8)

## 2018-11-07 ENCOUNTER — Encounter (INDEPENDENT_AMBULATORY_CARE_PROVIDER_SITE_OTHER): Payer: Self-pay | Admitting: Orthopaedic Surgery

## 2018-11-07 ENCOUNTER — Ambulatory Visit (INDEPENDENT_AMBULATORY_CARE_PROVIDER_SITE_OTHER): Payer: Medicare Other | Admitting: Orthopaedic Surgery

## 2018-11-07 DIAGNOSIS — M65311 Trigger thumb, right thumb: Secondary | ICD-10-CM

## 2018-11-07 NOTE — Progress Notes (Signed)
[name] is a 85 year old is following up status post trigger thumb release of the A1 pulley.  She says she is doing well and is finished therapy and has good motion of her thumb.  She is back to playing her piano as well.  On exam there is no triggering of her right thumb.  The incision is healing nicely.  She has good pinch and grip strength.  This point she will follow-up as needed.  I did give her a new prescription for outpatient physical therapy for her lumbar spine.  She understands that she has any issues at any time and not hesitate to come see me or Artis Delay.

## 2018-11-11 ENCOUNTER — Ambulatory Visit (INDEPENDENT_AMBULATORY_CARE_PROVIDER_SITE_OTHER): Payer: Self-pay

## 2018-11-11 ENCOUNTER — Ambulatory Visit (INDEPENDENT_AMBULATORY_CARE_PROVIDER_SITE_OTHER): Payer: Medicare Other | Admitting: Physical Medicine and Rehabilitation

## 2018-11-11 ENCOUNTER — Encounter (INDEPENDENT_AMBULATORY_CARE_PROVIDER_SITE_OTHER): Payer: Self-pay | Admitting: Physical Medicine and Rehabilitation

## 2018-11-11 VITALS — BP 141/78 | HR 62 | Temp 97.7°F

## 2018-11-11 DIAGNOSIS — M48062 Spinal stenosis, lumbar region with neurogenic claudication: Secondary | ICD-10-CM | POA: Diagnosis not present

## 2018-11-11 DIAGNOSIS — M5416 Radiculopathy, lumbar region: Secondary | ICD-10-CM

## 2018-11-11 MED ORDER — BETAMETHASONE SOD PHOS & ACET 6 (3-3) MG/ML IJ SUSP
12.0000 mg | Freq: Once | INTRAMUSCULAR | Status: AC
  Administered 2018-11-11: 12 mg

## 2018-11-11 NOTE — Progress Notes (Signed)
 .  Numeric Pain Rating Scale and Functional Assessment Average Pain 9   In the last MONTH (on 0-10 scale) has pain interfered with the following?  1. General activity like being  able to carry out your everyday physical activities such as walking, climbing stairs, carrying groceries, or moving a chair?  Rating(5)   +Driver, +BT(Eliquis ok for inj), -Dye Allergies.

## 2018-11-11 NOTE — Procedures (Signed)
Lumbosacral Transforaminal Epidural Steroid Injection - Sub-Pedicular Approach with Fluoroscopic Guidance  Patient: [name]      Date of Birth: 1931-07-18 MRN: [id number] PCP: Lauree Chandler, NP      Visit Date: 11/11/2018   Universal Protocol:    Date/Time: 11/11/2018  Consent Given By: the patient  Position: PRONE  Additional Comments: Vital signs were monitored before and after the procedure. Patient was prepped and draped in the usual sterile fashion. The correct patient, procedure, and site was verified.   Injection Procedure Details:  Procedure Site One Meds Administered:  Meds ordered this encounter  Medications  . betamethasone acetate-betamethasone sodium phosphate (CELESTONE) injection 12 mg    Laterality: Bilateral  Location/Site: MRI designated transitional segment as "L6" L5-S1  Needle size: 22 G  Needle type: Spinal  Needle Placement: Transforaminal  Findings:    -Comments: Excellent flow of contrast along the nerve and into the epidural space.  Procedure Details: After squaring off the end-plates to get a true AP view, the C-arm was positioned so that an oblique view of the foramen as noted above was visualized. The target area is just inferior to the "nose of the scotty dog" or sub pedicular. The soft tissues overlying this structure were infiltrated with 2-3 ml. of 1% Lidocaine without Epinephrine.  The spinal needle was inserted toward the target using a "trajectory" view along the fluoroscope beam.  Under AP and lateral visualization, the needle was advanced so it did not puncture dura and was located close the 6 O'Clock position of the pedical in AP tracterory. Biplanar projections were used to confirm position. Aspiration was confirmed to be negative for CSF and/or blood. A 1-2 ml. volume of Isovue-250 was injected and flow of contrast was noted at each level. Radiographs were obtained for documentation purposes.   After attaining the  desired flow of contrast documented above, a 0.5 to 1.0 ml test dose of 0.25% Marcaine was injected into each respective transforaminal space.  The patient was observed for 90 seconds post injection.  After no sensory deficits were reported, and normal lower extremity motor function was noted,   the above injectate was administered so that equal amounts of the injectate were placed at each foramen (level) into the transforaminal epidural space.   Additional Comments:  The patient tolerated the procedure well Dressing: Band-Aid    Post-procedure details: Patient was observed during the procedure. Post-procedure instructions were reviewed.  Patient left the clinic in stable condition.

## 2018-11-11 NOTE — Patient Instructions (Signed)

## 2018-11-14 ENCOUNTER — Telehealth (INDEPENDENT_AMBULATORY_CARE_PROVIDER_SITE_OTHER): Payer: Self-pay | Admitting: Physical Medicine and Rehabilitation

## 2018-11-14 ENCOUNTER — Other Ambulatory Visit: Payer: Self-pay | Admitting: Nurse Practitioner

## 2018-11-14 NOTE — Telephone Encounter (Signed)
Very nice.

## 2018-11-20 ENCOUNTER — Encounter (INDEPENDENT_AMBULATORY_CARE_PROVIDER_SITE_OTHER): Payer: Self-pay | Admitting: Physical Medicine and Rehabilitation

## 2018-11-20 NOTE — Progress Notes (Signed)
[name] - 84 y.o. female MRN [id number]  Date of birth: 1931/01/30  Office Visit Note: Visit Date: 11/01/2018 PCP: Lauree Chandler, NP Referred by: Lauree Chandler, NP  Subjective: Chief Complaint  Patient presents with  . Lower Back - Pain  . Left Leg - Pain, Other  . Right Leg - Pain, Other   HPI: [name] is a 84 y.o. female who comes in today For reevaluation and management of chronic back and bilateral hip and leg pain with history of stenosis.  She is followed by Dr. Jean Rosenthal in our office from an orthopedic standpoint.  The last time I saw her was in November 2018 and that note can be reviewed for further details of her history and issues we have had pertaining to injections and anticoagulation.  Nonetheless the patient comes in today stating that her symptoms are totally different than they were when she saw [place] before.  She states that her biggest complaint at this point is severe leg cramping the cramping occurs and more of an L5 distribution at least what she indicates down her legs.  She reports severity to the point where she is having to sleep in a recliner chair because it is worse when she is laying in the bed.  She reports extreme tightness feeling from the sides of the thighs down to the lower legs to the knees.  She reports tightness feeling behind the knees.  She does report uncomfortable back pain but not severe.  She reports laying on her stomach seems to improve the pain in her back.  Leaning forward also seems to help.  She has been in physical therapy for some time off and on and that seems to have helped.  She denies now any tingling paresthesia in the legs and that was a big issue with her in the past.  She also gets some soreness when she touches the lateral and posterior thighs.  Symptom is still worse on the right than the left like it has been.  She does suffer from some generalized anxiety disorder and does use some Xanax.  She does use  trazodone at night for sleep and has had difficulty sleeping.  She has not used any muscle relaxer.  She feels some better with heat and ice at times and we discussed this.  Appropriate Use Addendum Patient was evaluated for low back pain and according to appropriate use criteria (did/did not) receive imaging tests.  Review of Systems  Constitutional: Negative for chills, fever, malaise/fatigue and weight loss.  HENT: Negative for hearing loss and sinus pain.   Eyes: Negative for blurred vision, double vision and photophobia.  Respiratory: Negative for cough and shortness of breath.   Cardiovascular: Negative for chest pain, palpitations and leg swelling.  Gastrointestinal: Negative for abdominal pain, nausea and vomiting.  Genitourinary: Negative for flank pain.  Musculoskeletal: Positive for back pain. Negative for myalgias.       Bilateral leg pain and spasm  Skin: Negative for itching and rash.  Neurological: Positive for weakness. Negative for tremors and focal weakness.       Spasm  Endo/Heme/Allergies: Negative.   Psychiatric/Behavioral: Negative for depression.  All other systems reviewed and are negative.  Otherwise per HPI.  Assessment & Plan: Visit Diagnoses:  1. Leg cramping   2. Spinal stenosis of lumbar region with neurogenic claudication   3. Lumbar radiculopathy   4. Greater trochanteric bursitis, left   5. Greater trochanteric bursitis, right  Plan: Findings:  Chronic history of mechanical back pain with severe multifactorial stenosis at what the MRI term the L5-6 level which is really probably more L4-5 with a transitional segment but nonetheless we will use that numbering scheme.  Prior injection at this level many years ago did give her relief.  She is been reluctant to complete any other injections over the last year or so because of fearfulness with anticoagulation and her other conditions.  She does have some anxiety issues that do seem to modify her  treatment.  She has had physical therapy and medication management really without results.  We talked at length about spasming and cramping and the pathology of this.  I do think her lumbar spine is probably the source of why she is getting this in the legs.  We talked about the use of tonic water and hydration and stretching as well as potential uses for vitamin B12 etc.  Some of these she is already been doing.  We did teach her some stretching to do.  I do think bilateral L5 transforaminal injections would help and we can do this safely with her continuing on her anticoagulation.  We talked about some of the newer studies out looking at anticoagulation transforaminal injections and she does want to complete an injection.  I will prescribe today some baclofen for her to try for her cramping just to see if that is tolerated and helps.  We will get her back for bilateral L5 transforaminal injections based on the numbering scheme of the last MRI.  I do not think she needs any new imaging at this point but would consider that if she gets no relief with the injection.    Meds & Orders:  Meds ordered this encounter  Medications  . baclofen (LIORESAL) 10 MG tablet    Sig: Take 1/2 to 1 by mouth every 8hrs as needed for spasm    Dispense:  60 tablet    Refill:  0   No orders of the defined types were placed in this encounter.   Follow-up: Return for Bilateral L5 transforaminal epidural steroid injection.   Procedures: No procedures performed  No notes on file   Clinical History: LUMBAR SPINE MRI FINDINGS:  06/03/2015   . The conus medullaris appears normal, terminating at the L1 level. No abnormal signal demonstrated within the visualized distal spinal cord. Cauda equina unremarkable.  . Vertebral body heights maintained. Marrow signal within normal limits for age.Alignment anatomic.  . The visualized lower thoracic spine demonstrates no disc herniation, significant central canal or foraminal  stenosis.  . T12-L1: No disc herniation, significant central canal or foraminal stenosis. Marland Kitchen L1-L2: Small right paracentral disc extrusion seen only on sagittal images. No definite stenosis . L2-L3: No disc herniation, significant central canal or foraminal stenosis. Marland Kitchen L3-L4: Small inferiorly migrating central and right paracentral disc extrusion with potential encroachment of the right descending L4 nerve root . L4-L5: No disc herniation, significant central canal or foraminal stenosis. Marland Kitchen L5-L6: Grade 1 anterolisthesis. Severe facet arthrosis and ligament flavum thickening. Trefoil configuration of the central canal consistent with moderately severe central canal stenosis . L6-S2: No disc herniation, significant central canal or foraminal stenosis.  . Soft tissues are grossly unremarkable   IMPRESSION: 6 lumbar type vertebral bodies, lowest labeled L6 for the purposes of this report.  Grade 1 anterolisthesis of L5 on L6. Severe facet arthrosis and ligament flavum thickening. Trefoil configuration of the central canal consistent with moderately severe central canal  stenosis  Small inferiorly migrating central and right paracentral disc extrusion at L3-4 with potential encroachment of the right descending L4 nerve root  Small right paracentral disc extrusion at L1-2 seen only on sagittal images. No definite stenosis   She reports that she has never smoked. She has never used smokeless tobacco. No results for input(s): HGBA1C, LABURIC in the last 8760 hours.  Objective:  VS:  HT:    WT:   BMI:     BP:(!) 150/76  HR:62bpm  TEMP: ( )  RESP:  Physical Exam  Constitutional: She is oriented to person, place, and time. She appears well-developed and well-nourished. No distress.  HENT:  Head: Normocephalic and atraumatic.  Nose: Nose normal.  Mouth/Throat: Oropharynx is clear and moist.  Eyes: Pupils are equal, round, and reactive to light. Conjunctivae are normal.  Neck: Normal range of  motion. Neck supple.  Cardiovascular: Regular rhythm and intact distal pulses.  Pulmonary/Chest: Effort normal. No respiratory distress.  Abdominal: Soft. She exhibits no distension. There is no guarding.  Musculoskeletal:  Patient ambulates with a forward flexed lumbar spine has concordant low back pain with extension of the lumbar spine.  Has paraspinal tenderness and tenderness over the PSIS and greater trochanters and legs.  Somewhat tender points but not quite allodynia.  She has no clonus bilaterally.  She has good strength in the lower extremities.  Neurological: She is alert and oriented to person, place, and time. She exhibits normal muscle tone. Coordination normal.  Skin: Skin is warm. No rash noted. No erythema.  Psychiatric: She has a normal mood and affect. Her behavior is normal.  Nursing note and vitals reviewed.   Ortho Exam Imaging: No results found.  Past Medical/Family/Surgical/Social History: Medications & Allergies reviewed per EMR, new medications updated. Patient Active Problem List   Diagnosis Date Noted  . Trochanteric bursitis, right hip 05/16/2017  . Cardiac pacemaker in situ 06/21/2016  . Atypical atrial flutter ([place])   . Chest pain 04/10/2016  . Atrial fibrillation ([place]) 03/29/2016  . Hyperglycemia 02/17/2015  . Obese 02/17/2015  . Insomnia 01/27/2014  . 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 Knee joint replacement by other means 10/01/2013  . Arthritis of knee, right 07/04/2013  . Osteoarthritis of right knee 07/01/2013  . Cervicalgia 07/01/2013  . Bradycardia 05/13/2013  . HYPERCHOLESTEROLEMIA 06/02/2009  . ANXIETY 06/02/2009  . Essential hypertension 06/02/2009  . ESOPHAGEAL STRICTURE 06/02/2009  . GERD 06/02/2009  . HIATAL HERNIA 06/02/2009  . DIVERTICULOSIS, COLON 06/02/2009  . Dysphagia, pharyngoesophageal phase 06/02/2009  . Backache 05/01/2007  . LEG PAIN, RIGHT 05/01/2007   Past Medical History:  Diagnosis Date  . Allergic  rhinitis due to pollen   . Anxiety state, unspecified   . Arthritis    "right knee" (06/21/2016)  . Asymptomatic varicose veins   . Carpal tunnel syndrome   . Chest pain, unspecified   . Chronic lower back pain    "on the left side" (06/21/2016)  . Cramp of limb   . Depressive disorder, not elsewhere classified   . Diaphragmatic hernia without mention of obstruction or gangrene   . Diaphragmatic hernia without mention of obstruction or gangrene   . Disturbance of skin sensation   . Diverticulosis of colon (without mention of hemorrhage)   . Enthesopathy of hip region   . Esophageal reflux   . Hepatitis, unspecified   . History of blood transfusion    "w/both knee replacements"  . History of duodenal ulcer   .  Insomnia, unspecified   . Lumbago   . Migraine    "none since ~ 1990" (06/21/2016)  . Myalgia and myositis, unspecified   . Obesity, unspecified   . Other abnormal blood chemistry   . Other dysphagia   . Other malaise and fatigue   . Other nonspecific abnormal serum enzyme levels   . Other specified cardiac dysrhythmias(427.89)   . Pacemaker   . Pain in joint, ankle and foot   . Pain in joint, hand   . Pain in joint, lower leg   . Pain in joint, pelvic region and thigh   . Pain in limb   . Plantar fascial fibromatosis   . Presence of permanent cardiac pacemaker   . Reflux esophagitis   . Sciatica   . Spinal stenosis, unspecified region other than cervical   . Stricture and stenosis of esophagus   . Symptomatic menopausal or female climacteric states   . Unspecified essential hypertension   . Unspecified essential hypertension   . Unspecified vitamin D deficiency    Family History  Problem Relation Age of Onset  . Ovarian cancer Mother   . Uterine cancer Mother   . Cerebrovascular Accident Mother   . Heart disease Father   . Hypertension Brother   . Obesity Daughter    Past Surgical History:  Procedure Laterality Date  . BREAST BIOPSY Left 1990s X 2  .  CARDIOVERSION N/A 04/26/2016   Procedure: CARDIOVERSION;  Surgeon: Pixie Casino, MD;  Location: [facility];  Service: Cardiovascular;  Laterality: N/A;  . CATARACT EXTRACTION W/ INTRAOCULAR LENS IMPLANT Left 08/03/1999   DR EPES   . CATARACT EXTRACTION W/ INTRAOCULAR LENS IMPLANT Right 2000   DR EPES  . [place]  . COLONOSCOPY  1988   Normal   . DENTAL SURGERY Left 08/2016  . EP IMPLANTABLE DEVICE N/A 06/21/2016   Procedure: Pacemaker Implant;  Surgeon: Evans Lance, MD;  Location: [facility];  Service: Cardiovascular;  Laterality: N/A;  . ESOPHAGOGASTRODUODENOSCOPY (EGD) WITH ESOPHAGEAL DILATION  ~ 1982   Dr. Sharlett Iles  . EXCISION OF ACTINIC KERATOSIS     DR LUPTON   . EYE SURGERY    . INSERT / REPLACE / REMOVE PACEMAKER    . JOINT REPLACEMENT    . KNEE ARTHROSCOPY Left 2003  . KNEE ARTHROSCOPY Right 06-26-13  . KNEE CLOSED REDUCTION Right 10/23/2013   Procedure: CLOSED MANIPULATION RIGHT KNEE;  Surgeon: Mcarthur Rossetti, MD;  Location: [facility];  Service: Orthopedics;  Laterality: Right;  . LASER FOR CLOUDY CAP LEFT EYE Left 03/2006   DR DIGBY  . TOTAL KNEE ARTHROPLASTY Left 04/2004   DR RENDALL  . TOTAL KNEE ARTHROPLASTY Right 07/04/2013   Procedure: RIGHT TOTAL KNEE ARTHROPLASTY;  Surgeon: Mcarthur Rossetti, MD;  Location: [facility];  Service: Orthopedics;  Laterality: Right;  . TRIGGER FINGER RELEASE Right   . VAGINAL HYSTERECTOMY  1979   Social History   Occupational History  . Not on file  Tobacco Use  . Smoking status: Never Smoker  . Smokeless tobacco: Never Used  Substance and Sexual Activity  . Alcohol use: No  . Drug use: No  . Sexual activity: Yes

## 2018-11-20 NOTE — Progress Notes (Signed)
[name] - 84 y.o. female MRN [id number]  Date of birth: 18-Apr-1931  Office Visit Note: Visit Date: 11/11/2018 PCP: Lauree Chandler, NP Referred by: Lauree Chandler, NP  Subjective: Chief Complaint  Patient presents with  . Lower Back - Pain  . Right Leg - Pain  . Left Leg - Pain   HPI:  [name] is a 84 y.o. female who comes in today Planned bilateral L5 transforaminal epidural steroid injection.  Please see our prior evaluation and management note for further details and justification.  Prior MRI terms lowest lumbar level is L6.  ROS Otherwise per HPI.  Assessment & Plan: Visit Diagnoses:  1. Lumbar radiculopathy   2. 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   Meds & Orders:  Meds ordered this encounter  Medications  . betamethasone acetate-betamethasone sodium phosphate (CELESTONE) injection 12 mg    Orders Placed This Encounter  Procedures  . XR C-ARM NO REPORT  . Epidural Steroid injection    Follow-up: Return in about 4 weeks (around 12/09/2018).   Procedures: No procedures performed  Lumbosacral Transforaminal Epidural Steroid Injection - Sub-Pedicular Approach with Fluoroscopic Guidance  Patient: [name]      Date of Birth: 28-Feb-1931 MRN: [id number] PCP: Lauree Chandler, NP      Visit Date: 11/11/2018   Universal Protocol:    Date/Time: 11/11/2018  Consent Given By: the patient  Position: PRONE  Additional Comments: Vital signs were monitored before and after the procedure. Patient was prepped and draped in the usual sterile fashion. The correct patient, procedure, and site was verified.   Injection Procedure Details:  Procedure Site One Meds Administered:  Meds ordered this encounter  Medications  . betamethasone acetate-betamethasone sodium phosphate (CELESTONE) injection 12 mg    Laterality: Bilateral  Location/Site: MRI designated transitional segment  as "L6" L5-S1  Needle size: 22 G  Needle type: Spinal  Needle Placement: Transforaminal  Findings:    -Comments: Excellent flow of contrast along the nerve and into the epidural space.  Procedure Details: After squaring off the end-plates to get a true AP view, the C-arm was positioned so that an oblique view of the foramen as noted above was visualized. The target area is just inferior to the "nose of the scotty dog" or sub pedicular. The soft tissues overlying this structure were infiltrated with 2-3 ml. of 1% Lidocaine without Epinephrine.  The spinal needle was inserted toward the target using a "trajectory" view along the fluoroscope beam.  Under AP and lateral visualization, the needle was advanced so it did not puncture dura and was located close the 6 O'Clock position of the pedical in AP tracterory. Biplanar projections were used to confirm position. Aspiration was confirmed to be negative for CSF and/or blood. A 1-2 ml. volume of Isovue-250 was injected and flow of contrast was noted at each level. Radiographs were obtained for documentation purposes.   After attaining the desired flow of contrast documented above, a 0.5 to 1.0 ml test dose of 0.25% Marcaine was injected into each respective transforaminal space.  The patient was observed for 90 seconds post injection.  After no sensory deficits were reported, and normal lower extremity motor function was noted,   the above injectate was administered so that equal amounts of the injectate were placed at each foramen (level) into the transforaminal epidural space.   Additional Comments:  The patient tolerated the procedure well  Dressing: Band-Aid    Post-procedure details: Patient was observed during the procedure. Post-procedure instructions were reviewed.  Patient left the clinic in stable condition.     Clinical History: LUMBAR SPINE MRI FINDINGS:  06/03/2015   . The conus medullaris appears normal, terminating at the  L1 level. No abnormal signal demonstrated within the visualized distal spinal cord. Cauda equina unremarkable.  . Vertebral body heights maintained. Marrow signal within normal limits for age.Alignment anatomic.  . The visualized lower thoracic spine demonstrates no disc herniation, significant central canal or foraminal stenosis.  . T12-L1: No disc herniation, significant central canal or foraminal stenosis. Marland Kitchen L1-L2: Small right paracentral disc extrusion seen only on sagittal images. No definite stenosis . L2-L3: No disc herniation, significant central canal or foraminal stenosis. Marland Kitchen L3-L4: Small inferiorly migrating central and right paracentral disc extrusion with potential encroachment of the right descending L4 nerve root . L4-L5: No disc herniation, significant central canal or foraminal stenosis. Marland Kitchen L5-L6: Grade 1 anterolisthesis. Severe facet arthrosis and ligament flavum thickening. Trefoil configuration of the central canal consistent with moderately severe central canal stenosis . L6-S2: No disc herniation, significant central canal or foraminal stenosis.  . Soft tissues are grossly unremarkable   IMPRESSION: 6 lumbar type vertebral bodies, lowest labeled L6 for the purposes of this report.  Grade 1 anterolisthesis of L5 on L6. Severe facet arthrosis and ligament flavum thickening. Trefoil configuration of the central canal consistent with moderately severe central canal stenosis  Small inferiorly migrating central and right paracentral disc extrusion at L3-4 with potential encroachment of the right descending L4 nerve root  Small right paracentral disc extrusion at L1-2 seen only on sagittal images. No definite stenosis     Objective:  VS:  HT:    WT:   BMI:     BP:(!) 141/78  HR:62bpm  TEMP:97.7 F (36.5 C)(Oral)  RESP:  Physical Exam  Ortho Exam Imaging: No results found.

## 2018-11-25 ENCOUNTER — Telehealth (INDEPENDENT_AMBULATORY_CARE_PROVIDER_SITE_OTHER): Payer: Self-pay | Admitting: Physical Medicine and Rehabilitation

## 2018-11-25 DIAGNOSIS — R208 Other disturbances of skin sensation: Secondary | ICD-10-CM

## 2018-11-25 DIAGNOSIS — R200 Anesthesia of skin: Secondary | ICD-10-CM

## 2018-11-25 NOTE — Telephone Encounter (Signed)
Could injection the right L4 and L5 area, needs NCS/EMG by Dr. Posey Pronto to eval the feet numbness

## 2018-11-26 ENCOUNTER — Encounter: Payer: Self-pay | Admitting: Neurology

## 2018-11-26 NOTE — Telephone Encounter (Signed)
Referral to Dr. Posey Pronto placed.

## 2018-11-26 NOTE — Telephone Encounter (Signed)
Scheduled for 12/17 at 1415.

## 2018-11-27 ENCOUNTER — Other Ambulatory Visit: Payer: Self-pay | Admitting: *Deleted

## 2018-11-27 DIAGNOSIS — R200 Anesthesia of skin: Secondary | ICD-10-CM

## 2018-11-27 DIAGNOSIS — M545 Low back pain: Secondary | ICD-10-CM | POA: Diagnosis not present

## 2018-11-29 ENCOUNTER — Ambulatory Visit: Payer: Medicare Other

## 2018-12-04 DIAGNOSIS — M545 Low back pain: Secondary | ICD-10-CM | POA: Diagnosis not present

## 2018-12-10 ENCOUNTER — Encounter (INDEPENDENT_AMBULATORY_CARE_PROVIDER_SITE_OTHER): Payer: Self-pay | Admitting: Physical Medicine and Rehabilitation

## 2018-12-10 ENCOUNTER — Ambulatory Visit (INDEPENDENT_AMBULATORY_CARE_PROVIDER_SITE_OTHER): Payer: Self-pay

## 2018-12-10 ENCOUNTER — Ambulatory Visit (INDEPENDENT_AMBULATORY_CARE_PROVIDER_SITE_OTHER): Payer: Medicare Other | Admitting: Physical Medicine and Rehabilitation

## 2018-12-10 VITALS — BP 137/77 | HR 62 | Temp 97.9°F

## 2018-12-10 DIAGNOSIS — M48062 Spinal stenosis, lumbar region with neurogenic claudication: Secondary | ICD-10-CM

## 2018-12-10 DIAGNOSIS — M5416 Radiculopathy, lumbar region: Secondary | ICD-10-CM

## 2018-12-10 MED ORDER — BETAMETHASONE SOD PHOS & ACET 6 (3-3) MG/ML IJ SUSP
12.0000 mg | Freq: Once | INTRAMUSCULAR | Status: AC
  Administered 2018-12-10: 12 mg

## 2018-12-10 NOTE — Progress Notes (Signed)
 .  Numeric Pain Rating Scale and Functional Assessment Average Pain 8   In the last MONTH (on 0-10 scale) has pain interfered with the following?  1. General activity like being  able to carry out your everyday physical activities such as walking, climbing stairs, carrying groceries, or moving a chair?  Rating(6)   +Driver, +BT(eliquis, ok for inj), -Dye Allergies.

## 2018-12-10 NOTE — Patient Instructions (Signed)

## 2018-12-11 DIAGNOSIS — M545 Low back pain: Secondary | ICD-10-CM | POA: Diagnosis not present

## 2018-12-16 ENCOUNTER — Telehealth: Payer: Self-pay | Admitting: *Deleted

## 2018-12-16 MED ORDER — VALSARTAN 320 MG PO TABS: 320.0000 mg | ORAL_TABLET | Freq: Every day | ORAL | 0 refills | Status: DC

## 2018-12-16 MED ORDER — HYDROCHLOROTHIAZIDE 25 MG PO TABS: 25.0000 mg | ORAL_TABLET | Freq: Every day | ORAL | 0 refills | Status: DC

## 2018-12-16 NOTE — Telephone Encounter (Signed)
She is on valsartan HCTZ, is this on backorder or the plain valsartan?

## 2018-12-16 NOTE — Telephone Encounter (Signed)
Pharmacist stated we needed to split the Rx in 2 different medications.  Pended and sent to [facility] for approval.

## 2018-12-16 NOTE — Telephone Encounter (Signed)
Received fax from [employer] [phone] stating that patient's Valsartan in on Manufacturer backorder and requesting a new Rx. Please Advise.

## 2018-12-16 NOTE — Telephone Encounter (Signed)
Done - thank you.

## 2018-12-23 ENCOUNTER — Ambulatory Visit (INDEPENDENT_AMBULATORY_CARE_PROVIDER_SITE_OTHER): Payer: Medicare Other

## 2018-12-23 DIAGNOSIS — R001 Bradycardia, unspecified: Secondary | ICD-10-CM

## 2018-12-23 DIAGNOSIS — I495 Sick sinus syndrome: Secondary | ICD-10-CM

## 2018-12-23 NOTE — Progress Notes (Signed)
Remote pacemaker transmission.   

## 2018-12-24 LAB — CUP PACEART REMOTE DEVICE CHECK
Battery Remaining Longevity: 121 mo
Battery Remaining Percentage: 95.5 %
Battery Voltage: 2.99 V
Brady Statistic AP VP Percent: 1 %
Brady Statistic AP VS Percent: 98 %
Brady Statistic AS VP Percent: 1 %
Brady Statistic AS VS Percent: 2 %
Brady Statistic RA Percent Paced: 98 %
Brady Statistic RV Percent Paced: 1 %
Date Time Interrogation Session: 20191230173911
Implantable Lead Implant Date: 20170628
Implantable Lead Implant Date: 20170628
Implantable Lead Location: 753859
Implantable Pulse Generator Implant Date: 20170628
Lead Channel Impedance Value: 480 Ohm
Lead Channel Impedance Value: 530 Ohm
Lead Channel Pacing Threshold Amplitude: 0.5 V
Lead Channel Pacing Threshold Pulse Width: 0.4 ms
Lead Channel Pacing Threshold Pulse Width: 0.4 ms
Lead Channel Sensing Intrinsic Amplitude: 2.5 mV
Lead Channel Sensing Intrinsic Amplitude: 7.3 mV
Lead Channel Setting Pacing Amplitude: 2 V
Lead Channel Setting Pacing Amplitude: 2.5 V
Lead Channel Setting Sensing Sensitivity: 2 mV
MDC IDC LEAD LOCATION: 753860
MDC IDC MSMT LEADCHNL RV PACING THRESHOLD AMPLITUDE: 1 V
MDC IDC SET LEADCHNL RV PACING PULSEWIDTH: 0.4 ms
Pulse Gen Model: 2272
Pulse Gen Serial Number: 7910313

## 2018-12-26 ENCOUNTER — Telehealth (INDEPENDENT_AMBULATORY_CARE_PROVIDER_SITE_OTHER): Payer: Self-pay | Admitting: Physical Medicine and Rehabilitation

## 2018-12-26 DIAGNOSIS — M545 Low back pain: Secondary | ICD-10-CM | POA: Diagnosis not present

## 2018-12-26 NOTE — Telephone Encounter (Signed)
Could try another injection but that maybe where she stands, can keep doing gentle stretching, tonic water etc. She has stenosis, could consider surgery. Can always ov to discus.

## 2018-12-27 ENCOUNTER — Other Ambulatory Visit: Payer: Self-pay

## 2018-12-27 DIAGNOSIS — G4700 Insomnia, unspecified: Secondary | ICD-10-CM

## 2018-12-27 DIAGNOSIS — L570 Actinic keratosis: Secondary | ICD-10-CM | POA: Diagnosis not present

## 2018-12-27 DIAGNOSIS — L814 Other melanin hyperpigmentation: Secondary | ICD-10-CM | POA: Diagnosis not present

## 2018-12-27 MED ORDER — ALPRAZOLAM 0.25 MG PO TABS: ORAL_TABLET | ORAL | 0 refills | Status: DC

## 2018-12-27 NOTE — Telephone Encounter (Signed)
Can you call patient to discuss/ schedule?

## 2018-12-27 NOTE — Telephone Encounter (Signed)
Pt is scheduled for an OV 01/09/2019.

## 2018-12-27 NOTE — Telephone Encounter (Signed)
Last refill 04/11/2017 Last OV 10/02/2018 database verified last refilled by Jeanmarie Hubert

## 2018-12-31 DIAGNOSIS — M545 Low back pain: Secondary | ICD-10-CM | POA: Diagnosis not present

## 2019-01-01 ENCOUNTER — Other Ambulatory Visit (INDEPENDENT_AMBULATORY_CARE_PROVIDER_SITE_OTHER): Payer: Self-pay | Admitting: Physical Medicine and Rehabilitation

## 2019-01-02 NOTE — Telephone Encounter (Signed)
Please advise 

## 2019-01-03 ENCOUNTER — Ambulatory Visit
Admission: RE | Admit: 2019-01-03 | Discharge: 2019-01-03 | Disposition: A | Discharge status: Home / Self Care | Payer: Medicare Other | Source: Ambulatory Visit | Attending: Nurse Practitioner | Admitting: Nurse Practitioner

## 2019-01-03 DIAGNOSIS — Z780 Asymptomatic menopausal state: Secondary | ICD-10-CM | POA: Diagnosis not present

## 2019-01-03 DIAGNOSIS — E2839 Other primary ovarian failure: Secondary | ICD-10-CM

## 2019-01-03 DIAGNOSIS — M810 Age-related osteoporosis without current pathological fracture: Secondary | ICD-10-CM | POA: Diagnosis not present

## 2019-01-06 ENCOUNTER — Encounter: Payer: Self-pay | Admitting: Nurse Practitioner

## 2019-01-06 ENCOUNTER — Other Ambulatory Visit: Payer: Self-pay | Admitting: Nurse Practitioner

## 2019-01-06 ENCOUNTER — Ambulatory Visit (INDEPENDENT_AMBULATORY_CARE_PROVIDER_SITE_OTHER): Payer: Medicare Other | Admitting: Nurse Practitioner

## 2019-01-06 ENCOUNTER — Ambulatory Visit: Payer: Medicare Other

## 2019-01-06 VITALS — BP 122/78 | HR 62 | Ht 63.0 in | Wt 187.2 lb

## 2019-01-06 DIAGNOSIS — M8580 Other specified disorders of bone density and structure, unspecified site: Secondary | ICD-10-CM

## 2019-01-06 DIAGNOSIS — Z0000 Encounter for general adult medical examination without abnormal findings: Secondary | ICD-10-CM

## 2019-01-06 MED ORDER — ALENDRONATE SODIUM 70 MG PO TABS: 70.0000 mg | ORAL_TABLET | ORAL | 11 refills | Status: DC

## 2019-01-06 NOTE — Progress Notes (Signed)
Subjective:   [name] is a 85 y.o. female who presents for Medicare Annual (Subsequent) preventive examination.  Review of Systems:  Cardiac Risk Factors include: advanced age (>25men, >58 women);obesity (BMI >30kg/m2);family history of premature cardiovascular disease;hypertension     Objective:     Vitals: BP 122/78   Pulse 62   Ht 5\' 3"  (1.6 m)   Wt 187 lb 3.2 oz (84.9 kg)   SpO2 97%   BMI 33.16 kg/m   Body mass index is 33.16 kg/m.  Advanced Directives 01/06/2019 05/02/2018 01/02/2018 11/21/2017 08/09/2017 06/26/2017 06/24/2017  Does Patient Have a Medical Advance Directive? Yes Yes Yes Yes Yes Yes Yes  Type of Industrial/product designer of [employer] Power of [place];Living will Healthcare Power of [place] of [place];Living will [place];Living will  Does patient want to make changes to medical advance directive? - - - No - Patient declined - - -  Copy of [place] in Chart? - Yes Yes Yes - Yes -  Pre-existing out of facility DNR order (yellow form or pink MOST form) - - - - - - -    Tobacco Social History   Tobacco Use  Smoking Status Never Smoker  Smokeless Tobacco Never Used     Counseling given: Not Answered   Clinical Intake:  Pre-visit preparation completed: Yes  Pain : 0-10 Pain Score: 9  Pain Type: Chronic pain Pain Location: Back Pain Orientation: Right Pain Radiating Towards: right leg  Pain Descriptors / Indicators: Spasm Pain Onset: More than a month ago Pain Frequency: Constant Pain Relieving Factors: tylenol   Pain Relieving Factors: tylenol   BMI - recorded: 33.16 Nutritional Status: BMI > 30  Obese Nutritional Risks: None Diabetes: No  How often do you need to have someone help you when you read instructions, pamphlets, or other written materials from your doctor or pharmacy?: 1 - Never What is the last  grade level you completed in school?: 2 [employer]         Past Medical History:  Diagnosis Date  . Allergic rhinitis due to pollen   . Anxiety state, unspecified   . Arthritis    "right knee" (06/21/2016)  . Asymptomatic varicose veins   . Carpal tunnel syndrome   . Chest pain, unspecified   . Chronic lower back pain    "on the left side" (06/21/2016)  . Cramp of limb   . Depressive disorder, not elsewhere classified   . Diaphragmatic hernia without mention of obstruction or gangrene   . Diaphragmatic hernia without mention of obstruction or gangrene   . Disturbance of skin sensation   . Diverticulosis of colon (without mention of hemorrhage)   . Enthesopathy of hip region   . Esophageal reflux   . Hepatitis, unspecified   . History of blood transfusion    "w/both knee replacements"  . History of duodenal ulcer   . Insomnia, unspecified   . Lumbago   . Migraine    "none since ~ 1990" (06/21/2016)  . Myalgia and myositis, unspecified   . Obesity, unspecified   . Other abnormal blood chemistry   . Other dysphagia   . Other malaise and fatigue   . Other nonspecific abnormal serum enzyme levels   . Other specified cardiac dysrhythmias(427.89)   . Pacemaker   . Pain in joint, ankle and foot   . Pain in joint, hand   .  Pain in joint, lower leg   . Pain in joint, pelvic region and thigh   . Pain in limb   . Plantar fascial fibromatosis   . Presence of permanent cardiac pacemaker   . Reflux esophagitis   . Sciatica   . Spinal stenosis, unspecified region other than cervical   . Stricture and stenosis of esophagus   . Symptomatic menopausal or female climacteric states   . Unspecified essential hypertension   . Unspecified essential hypertension   . Unspecified vitamin D deficiency    Past Surgical History:  Procedure Laterality Date  . BREAST BIOPSY Left 1990s X 2  . CARDIOVERSION N/A 04/26/2016   Procedure: CARDIOVERSION;  Surgeon: Pixie Casino,  MD;  Location: [facility];  Service: Cardiovascular;  Laterality: N/A;  . CATARACT EXTRACTION W/ INTRAOCULAR LENS IMPLANT Left 08/03/1999   DR EPES   . CATARACT EXTRACTION W/ INTRAOCULAR LENS IMPLANT Right 2000   DR EPES  . [place]  . COLONOSCOPY  1988   Normal   . DENTAL SURGERY Left 08/2016  . EP IMPLANTABLE DEVICE N/A 06/21/2016   Procedure: Pacemaker Implant;  Surgeon: Evans Lance, MD;  Location: [facility];  Service: Cardiovascular;  Laterality: N/A;  . ESOPHAGOGASTRODUODENOSCOPY (EGD) WITH ESOPHAGEAL DILATION  ~ 1982   Dr. Sharlett Iles  . EXCISION OF ACTINIC KERATOSIS     DR LUPTON   . EYE SURGERY    . INSERT / REPLACE / REMOVE PACEMAKER    . JOINT REPLACEMENT    . KNEE ARTHROSCOPY Left 2003  . KNEE ARTHROSCOPY Right 06-26-13  . KNEE CLOSED REDUCTION Right 10/23/2013   Procedure: CLOSED MANIPULATION RIGHT KNEE;  Surgeon: Mcarthur Rossetti, MD;  Location: [facility];  Service: Orthopedics;  Laterality: Right;  . LASER FOR CLOUDY CAP LEFT EYE Left 03/2006   DR DIGBY  . TOTAL KNEE ARTHROPLASTY Left 04/2004   DR RENDALL  . TOTAL KNEE ARTHROPLASTY Right 07/04/2013   Procedure: RIGHT TOTAL KNEE ARTHROPLASTY;  Surgeon: Mcarthur Rossetti, MD;  Location: [facility];  Service: Orthopedics;  Laterality: Right;  . TRIGGER FINGER RELEASE Right   . VAGINAL HYSTERECTOMY  1979   Family History  Problem Relation Age of Onset  . Ovarian cancer Mother   . Uterine cancer Mother   . Cerebrovascular Accident Mother   . Heart disease Father   . Hypertension Brother   . Obesity Daughter    Social History   Socioeconomic History  . Marital status: Married    Spouse name: Not on file  . Number of children: Not on file  . Years of education: Not on file  . Highest education level: Not on file  Occupational History  . Not on file  Social Needs  . Financial resource strain: Not hard at all  . Food insecurity:    Worry: Never true    Inability:  Never true  . Transportation needs:    Medical: No    Non-medical: No  Tobacco Use  . Smoking status: Never Smoker  . Smokeless tobacco: Never Used  Substance and Sexual Activity  . Alcohol use: No  . Drug use: No  . Sexual activity: Yes  Lifestyle  . Physical activity:    Days per week: 5 days    Minutes per session: 30 min  . Stress: To some extent  Relationships  . Social connections:    Talks on phone: Once a week    Gets together:  More than three times a week    Attends religious service: 1 to 4 times per year    Active member of club or organization: Yes    Attends meetings of clubs or organizations: 1 to 4 times per year    Relationship status: Married  Other Topics Concern  . Not on file  Social History Narrative  . Not on file    Outpatient Encounter Medications as of 01/06/2019  Medication Sig  . acetaminophen (TYLENOL) 500 MG tablet Take 500 mg by mouth every 6 (six) hours as needed for mild pain.   Marland Kitchen acetaminophen-codeine (TYLENOL #3) 300-30 MG tablet Take 1-2 tablets by mouth every 8 (eight) hours as needed for moderate pain.  Marland Kitchen ALPRAZolam (XANAX) 0.25 MG tablet Take one tablet by mouth at bedtime as needed for rest or anxiety  . antiseptic oral rinse (BIOTENE) LIQD 15 mLs by Mouth Rinse route daily as needed for dry mouth.  Marland Kitchen apixaban (ELIQUIS) 5 MG TABS tablet Take 1 tablet (5 mg total) by mouth 2 (two) times daily.  . baclofen (LIORESAL) 10 MG tablet TAKE 1/2 TO 1 TABLET BY MOUTH EVERY 8 HOURS AS NEEDED FOR SPASM  . bimatoprost (LUMIGAN) 0.01 % SOLN Instill 1 drop into both eyes once daily in the evening  . BIOTIN 5000 PO Take 1 capsule by mouth daily.  . Cholecalciferol (VITAMIN D) 2000 UNITS tablet Take 2,000 Units by mouth daily.  . dorzolamide (TRUSOPT) 2 % ophthalmic solution Place 1 drop into both eyes 3 (three) times daily.   Marland Kitchen esomeprazole (NEXIUM) 20 MG capsule TAKE 1 CAPSULE BY MOUTH EVERY DAY  . ezetimibe (ZETIA) 10 MG tablet Take 1 tablet (10 mg  total) by mouth daily.  . hydrochlorothiazide (HYDRODIURIL) 25 MG tablet Take 1 tablet (25 mg total) by mouth daily.  . Ketoprofen 10 % CREA Apply 1 application topically daily as needed (pain). 20% cream; PRN for pain.  . methylcellulose (ARTIFICIAL TEARS) 1 % ophthalmic solution Place 1 drop into both eyes daily as needed (for dry eyes).   . metoprolol tartrate (LOPRESSOR) 50 MG tablet TAKE 1 TABLET BY MOUTH TWICE A DAY  . propafenone (RYTHMOL SR) 225 MG 12 hr capsule TAKE 1 CAPSULE (225 MG TOTAL) BY MOUTH 2 (TWO) TIMES DAILY.  . traZODone (DESYREL) 50 MG tablet TAKE 1 TABLET BY MOUTH EVERY NIGHT TO HELP WITH SLEEP  . valsartan (DIOVAN) 320 MG tablet Take 1 tablet (320 mg total) by mouth daily.   No facility-administered encounter medications on file as of 01/06/2019.     Activities of Daily Living In your present state of health, do you have any difficulty performing the following activities: 01/06/2019  Hearing? N  Vision? N  Difficulty concentrating or making decisions? N  Walking or climbing stairs? Y  Comment due to back pain  Dressing or bathing? N  Doing errands, shopping? N  Preparing Food and eating ? N  Using the Toilet? N  In the past six months, have you accidently leaked urine? Y  Do you have problems with loss of bowel control? N  Managing your Medications? N  Managing your Finances? N  Housekeeping or managing your Housekeeping? N  Some recent data might be hidden    Patient Care Team: Lauree Chandler, NP as PCP - General (Geriatric Medicine) Mcarthur Rossetti, MD as Attending Physician (Orthopedic Surgery) Larey Dresser, MD as Attending Physician (Cardiology) Thornell Sartorius, MD as Consulting Physician (Otolaryngology) Druscilla Brownie, MD as Consulting  Physician (Dermatology) Calvert Cantor, MD as Consulting Physician (Ophthalmology) Debara Pickett Nadean Corwin, MD as Consulting Physician (Cardiology)    Assessment:   This is a routine wellness examination  for [place].  Exercise Activities and Dietary recommendations Current Exercise Habits: Home exercise routine, Type of exercise: strength training/weights;stretching, Time (Minutes): 40, Frequency (Times/Week): 3, Weekly Exercise (Minutes/Week): 120, Intensity: Mild, Exercise limited by: orthopedic condition(s)(back pain with radiation into right leg)  Goals    . DIET - INCREASE WATER INTAKE     Patient will increase water by 1-2 cups    . Increase water intake     Starting 12/12/16, I will attempt to increase my water intake to 5 glasses per day.     . Weight (lb) < 175 lb (79.4 kg) (pt-stated)       Fall Risk Fall Risk  01/06/2019 10/02/2018 05/02/2018 01/02/2018 11/21/2017  Falls in the past year? 0 No No Yes Yes  Number falls in past yr: 0 - - 1 2 or more  Comment - - - - -  Injury with Fall? 0 - - No Yes  Comment - - - - -  Risk Factor Category  - - - - -  Risk for fall due to : - - - - -  Follow up - - - - -  Comment - - - - -   Is the patient's home free of loose throw rugs in walkways, pet beds, electrical cords, etc?   no      Grab bars in the bathroom? no      Handrails on the stairs?   yes      Adequate lighting?   yes   Depression Screen PHQ 2/9 Scores 01/06/2019 01/02/2018 11/21/2017 12/12/2016  PHQ - 2 Score 0 0 0 0     Cognitive Function MMSE - Mini Mental State Exam 01/06/2019 11/21/2017 12/12/2016 11/30/2015  Orientation to time 5 5 5 5   Orientation to Place 5 5 5 5   Registration 3 3 3 3   Attention/ Calculation 5 5 5 5   Recall 3 3 3 3   Language- name 2 objects 2 2 2 2   Language- repeat 1 1 1 1   Language- follow 3 step command 3 3 3 3   Language- read & follow direction 1 1 1 1   Write a sentence 1 1 1 1   Copy design 1 1 1 1   Total score 30 30 30 30         Immunization History  Administered Date(s) Administered  . Influenza, High Dose Seasonal PF 09/14/2017, 10/02/2018  . Influenza,inj,Quad PF,6+ Mos 09/23/2013, 09/25/2014, 10/11/2015, 08/30/2016  .  Pneumococcal Conjugate-13 02/17/2015  . Pneumococcal Polysaccharide-23 12/12/2016  . Tdap 06/24/2017  . Zoster 01/29/2008  . Zoster Recombinat (Shingrix) 09/04/2017, 02/21/2018    Qualifies for Shingles Vaccine? has already gotten vaccine   Screening Tests Health Maintenance  Topic Date Due  . MAMMOGRAM  03/29/2018  . TETANUS/TDAP  06/25/2027  . INFLUENZA VACCINE  Completed  . DEXA SCAN  Completed  . PNA vac Low Risk Adult  Completed    Cancer Screenings: Lung: Low Dose CT Chest recommended if Age 37-80 years, 30 pack-year currently smoking OR have quit w/in 15years. Patient does not qualify. Breast:  Up to date on Mammogram? Yes   Up to date of Bone Density/Dexa? Yes Colorectal: aged out.   Additional Screenings:: Hepatitis C Screening: declines      Plan:     I have personally reviewed and noted the following in  the patient's chart:   . Medical and social history . Use of alcohol, tobacco or illicit drugs  . Current medications and supplements . Functional ability and status . Nutritional status . Physical activity . Advanced directives . List of other physicians . Hospitalizations, surgeries, and ER visits in previous 12 months . Vitals . Screenings to include cognitive, depression, and falls . Referrals and appointments  In addition, I have reviewed and discussed with patient certain preventive protocols, quality metrics, and best practice recommendations. A written personalized care plan for preventive services as well as general preventive health recommendations were provided to patient.     Lauree Chandler, NP  01/06/2019

## 2019-01-06 NOTE — Patient Instructions (Signed)
[name] , Thank you for taking time to come for your Medicare Wellness Visit. I appreciate your ongoing commitment to your health goals. Please review the following plan we discussed and let me know if I can assist you in the future.   Screening recommendations/referrals: Colonoscopy aged out Mammogram- to go in April  Bone Density - just completed, up to date. Recommended yearly ophthalmology/optometry visit for glaucoma screening and checkup Recommended yearly dental visit for hygiene and checkup  Vaccinations: Influenza vaccine- up to date Pneumococcal vaccine up to date Tdap vaccine up to date Shingles vaccine up to date  Advanced directives: on file  Conditions/risks identified: increase activity as tolerates, weight loss encouraged.   Next appointment: 1 year for AWV  Keep follow up visit as scheduled.    Preventive Care 32 Years and Older, Female Preventive care refers to lifestyle choices and visits with your health care provider that can promote health and wellness. What does preventive care include?  A yearly physical exam. This is also called an annual well check.  Dental exams once or twice a year.  Routine eye exams. Ask your health care provider how often you should have your eyes checked.  Personal lifestyle choices, including:  Daily care of your teeth and gums.  Regular physical activity.  Eating a healthy diet.  Avoiding tobacco and drug use.  Limiting alcohol use.  Practicing safe sex.  Taking low-dose aspirin every day.  Taking vitamin and mineral supplements as recommended by your health care provider. What happens during an annual well check? The services and screenings done by your health care provider during your annual well check will depend on your age, overall health, lifestyle risk factors, and family history of disease. Counseling  Your health care provider may ask you questions about your:  Alcohol use.  Tobacco use.  Drug  use.  Emotional well-being.  Home and relationship well-being.  Sexual activity.  Eating habits.  History of falls.  Memory and ability to understand (cognition).  Work and work Statistician.  Reproductive health. Screening  You may have the following tests or measurements:  Height, weight, and BMI.  Blood pressure.  Lipid and cholesterol levels. These may be checked every 5 years, or more frequently if you are over 34 years old.  Skin check.  Lung cancer screening. You may have this screening every year starting at age 59 if you have a 30-pack-year history of smoking and currently smoke or have quit within the past 15 years.  Fecal occult blood test (FOBT) of the stool. You may have this test every year starting at age 72.  Flexible sigmoidoscopy or colonoscopy. You may have a sigmoidoscopy every 5 years or a colonoscopy every 10 years starting at age 50.  Hepatitis C blood test.  Hepatitis B blood test.  Sexually transmitted disease (STD) testing.  Diabetes screening. This is done by checking your blood sugar (glucose) after you have not eaten for a while (fasting). You may have this done every 1-3 years.  Bone density scan. This is done to screen for osteoporosis. You may have this done starting at age 76.  Mammogram. This may be done every 1-2 years. Talk to your health care provider about how often you should have regular mammograms. Talk with your health care provider about your test results, treatment options, and if necessary, the need for more tests. Vaccines  Your health care provider may recommend certain vaccines, such as:  Influenza vaccine. This is recommended every year.  Tetanus, diphtheria, and acellular pertussis (Tdap, Td) vaccine. You may need a Td booster every 10 years.  Zoster vaccine. You may need this after age 44.  Pneumococcal 13-valent conjugate (PCV13) vaccine. One dose is recommended after age 55.  Pneumococcal polysaccharide  (PPSV23) vaccine. One dose is recommended after age 5. Talk to your health care provider about which screenings and vaccines you need and how often you need them. This information is not intended to replace advice given to you by your health care provider. Make sure you discuss any questions you have with your health care provider. Document Released: 01/07/2016 Document Revised: 08/30/2016 Document Reviewed: 10/12/2015 Elsevier Interactive Patient Education  2017 [place] Prevention in the Home Falls can cause injuries. They can happen to people of all ages. There are many things you can do to make your home safe and to help prevent falls. What can I do on the outside of my home?  Regularly fix the edges of walkways and driveways and fix any cracks.  Remove anything that might make you trip as you walk through a door, such as a raised step or threshold.  Trim any bushes or trees on the path to your home.  Use bright outdoor lighting.  Clear any walking paths of anything that might make someone trip, such as rocks or tools.  Regularly check to see if handrails are loose or broken. Make sure that both sides of any steps have handrails.  Any raised decks and porches should have guardrails on the edges.  Have any leaves, snow, or ice cleared regularly.  Use sand or salt on walking paths during winter.  Clean up any spills in your garage right away. This includes oil or grease spills. What can I do in the bathroom?  Use night lights.  Install grab bars by the toilet and in the tub and shower. Do not use towel bars as grab bars.  Use non-skid mats or decals in the tub or shower.  If you need to sit down in the shower, use a plastic, non-slip stool.  Keep the floor dry. Clean up any water that spills on the floor as soon as it happens.  Remove soap buildup in the tub or shower regularly.  Attach bath mats securely with double-sided non-slip rug tape.  Do not have  throw rugs and other things on the floor that can make you trip. What can I do in the bedroom?  Use night lights.  Make sure that you have a light by your bed that is easy to reach.  Do not use any sheets or blankets that are too big for your bed. They should not hang down onto the floor.  Have a firm chair that has side arms. You can use this for support while you get dressed.  Do not have throw rugs and other things on the floor that can make you trip. What can I do in the kitchen?  Clean up any spills right away.  Avoid walking on wet floors.  Keep items that you use a lot in easy-to-reach places.  If you need to reach something above you, use a strong step stool that has a grab bar.  Keep electrical cords out of the way.  Do not use floor polish or wax that makes floors slippery. If you must use wax, use non-skid floor wax.  Do not have throw rugs and other things on the floor that can make you trip. What can I do  with my stairs?  Do not leave any items on the stairs.  Make sure that there are handrails on both sides of the stairs and use them. Fix handrails that are broken or loose. Make sure that handrails are as long as the stairways.  Check any carpeting to make sure that it is firmly attached to the stairs. Fix any carpet that is loose or worn.  Avoid having throw rugs at the top or bottom of the stairs. If you do have throw rugs, attach them to the floor with carpet tape.  Make sure that you have a light switch at the top of the stairs and the bottom of the stairs. If you do not have them, ask someone to add them for you. What else can I do to help prevent falls?  Wear shoes that:  Do not have high heels.  Have rubber bottoms.  Are comfortable and fit you well.  Are closed at the toe. Do not wear sandals.  If you use a stepladder:  Make sure that it is fully opened. Do not climb a closed stepladder.  Make sure that both sides of the stepladder are  locked into place.  Ask someone to hold it for you, if possible.  Clearly mark and make sure that you can see:  Any grab bars or handrails.  First and last steps.  Where the edge of each step is.  Use tools that help you move around (mobility aids) if they are needed. These include:  Canes.  Walkers.  Scooters.  Crutches.  Turn on the lights when you go into a dark area. Replace any light bulbs as soon as they burn out.  Set up your furniture so you have a clear path. Avoid moving your furniture around.  If any of your floors are uneven, fix them.  If there are any pets around you, be aware of where they are.  Review your medicines with your doctor. Some medicines can make you feel dizzy. This can increase your chance of falling. Ask your doctor what other things that you can do to help prevent falls. This information is not intended to replace advice given to you by your health care provider. Make sure you discuss any questions you have with your health care provider. Document Released: 10/07/2009 Document Revised: 05/18/2016 Document Reviewed: 01/15/2015 Elsevier Interactive Patient Education  2017 [employer].

## 2019-01-07 ENCOUNTER — Encounter: Payer: Medicare Other | Admitting: Neurology

## 2019-01-09 ENCOUNTER — Encounter

## 2019-01-09 ENCOUNTER — Ambulatory Visit (INDEPENDENT_AMBULATORY_CARE_PROVIDER_SITE_OTHER): Payer: Medicare Other | Admitting: Physical Medicine and Rehabilitation

## 2019-01-09 VITALS — BP 122/65 | HR 62 | Wt 187.0 lb

## 2019-01-09 DIAGNOSIS — R252 Cramp and spasm: Secondary | ICD-10-CM | POA: Diagnosis not present

## 2019-01-09 DIAGNOSIS — M5416 Radiculopathy, lumbar region: Secondary | ICD-10-CM

## 2019-01-09 DIAGNOSIS — M48062 Spinal stenosis, lumbar region with neurogenic claudication: Secondary | ICD-10-CM

## 2019-01-09 NOTE — Progress Notes (Signed)
Numeric Pain Rating Scale and Functional Assessment Average Pain 4 Pain Right Now 2 My pain is intermittent, sharp and stabbing Pain is worse with: walking, bending and standing Pain improves with: rest, heat/ice, medication and injections   In the last MONTH (on 0-10 scale) has pain interfered with the following?  1. General activity like being  able to carry out your everyday physical activities such as walking, climbing stairs, carrying groceries, or moving a chair?  Rating(7)  2. Relation with others like being able to carry out your usual social activities and roles such as  activities at home, at work and in your community. Rating(7)  3. Enjoyment of life such that you have  been bothered by emotional problems such as feeling anxious, depressed or irritable?  Rating(7)

## 2019-01-12 ENCOUNTER — Other Ambulatory Visit: Payer: Self-pay | Admitting: Nurse Practitioner

## 2019-01-13 ENCOUNTER — Ambulatory Visit (INDEPENDENT_AMBULATORY_CARE_PROVIDER_SITE_OTHER): Payer: Medicare Other | Admitting: Family

## 2019-01-13 ENCOUNTER — Encounter: Payer: Self-pay | Admitting: Family

## 2019-01-13 ENCOUNTER — Telehealth: Payer: Self-pay | Admitting: *Deleted

## 2019-01-13 VITALS — BP 102/80 | HR 64 | Temp 98.6°F | Resp 18 | Ht 63.0 in | Wt 184.2 lb

## 2019-01-13 DIAGNOSIS — J029 Acute pharyngitis, unspecified: Secondary | ICD-10-CM

## 2019-01-13 DIAGNOSIS — R05 Cough: Secondary | ICD-10-CM | POA: Diagnosis not present

## 2019-01-13 DIAGNOSIS — R059 Cough, unspecified: Secondary | ICD-10-CM

## 2019-01-13 MED ORDER — SACCHAROMYCES BOULARDII 250 MG PO CAPS: 250.0000 mg | ORAL_CAPSULE | Freq: Two times a day (BID) | ORAL | 0 refills | Status: AC

## 2019-01-13 MED ORDER — AMOXICILLIN-POT CLAVULANATE 875-125 MG PO TABS: 1.0000 | ORAL_TABLET | Freq: Two times a day (BID) | ORAL | 0 refills | Status: AC

## 2019-01-13 MED ORDER — HYDROCOD POLST-CPM POLST ER 10-8 MG/5ML PO SUER: 5.0000 mL | Freq: Two times a day (BID) | ORAL | 0 refills | Status: DC | PRN

## 2019-01-13 MED ORDER — HYDROCODONE-CHLORPHENIRAMINE 5-4 MG/5ML PO SOLN: 5.0000 mL | Freq: Two times a day (BID) | ORAL | 0 refills | Status: DC

## 2019-01-13 NOTE — Progress Notes (Signed)
Provider: Tonia Avino FNP-C  Veronica Chandler, NP  Patient Care Team: Veronica Chandler, NP as PCP - General (Geriatric Medicine) Mcarthur Rossetti, MD as Attending Physician (Orthopedic Surgery) Larey Dresser, MD as Attending Physician (Cardiology) Thornell Sartorius, MD as Consulting Physician (Otolaryngology) Druscilla Brownie, MD as Consulting Physician (Dermatology) Calvert Cantor, MD as Consulting Physician (Ophthalmology) Debara Pickett Nadean Corwin, MD as Consulting Physician (Cardiology)  Extended Emergency Contact Information Primary Emergency Contact: [name],Veronica Address: [address]          [place], [place] [address] [name] of [place] Phone: [phone] Mobile Phone: [phone] Relation: Spouse Secondary Emergency Contact: [name] [place] of [place] Mobile Phone: [phone] Relation: Friend   Goals of care: Advanced Directive information Advanced Directives 01/13/2019  Does Patient Have a Medical Advance Directive? Yes  Type of Advance Directive [name]  Does patient want to make changes to medical advance directive? No - Patient declined  Copy of [place] in Chart? Yes - validated most recent copy scanned in chart (See row information)  Pre-existing out of facility DNR order (yellow form or pink MOST form) -     Chief Complaint  Patient presents with  . Acute Visit    Patient c/o of cough, painful bumps on tongue and sore throat hard to swallow since Saturday. Taking tussionex she had since 2017    HPI:  Pt is a 85 y.o. female seen today for an acute visit for evaluation of cough,sore throat and painful bumps on tongue x 3 days.she states very painful to swallow.she has taken tylenol for sore throat pain.she was unable to sleep last night due to cough.she took a little tussinex that she had from previous visit with much improvement.she request refill on Tussinex.Also states nose feels stuffed  up but uses nasal spray.  She denies any fever or chills.      Past Medical History:  Diagnosis Date  . Allergic rhinitis due to pollen   . Anxiety state, unspecified   . Arthritis    "right knee" (06/21/2016)  . Asymptomatic varicose veins   . Carpal tunnel syndrome   . Chest pain, unspecified   . Chronic lower back pain    "on the left side" (06/21/2016)  . Cramp of limb   . Depressive disorder, not elsewhere classified   . Diaphragmatic hernia without mention of obstruction or gangrene   . Diaphragmatic hernia without mention of obstruction or gangrene   . Disturbance of skin sensation   . Diverticulosis of colon (without mention of hemorrhage)   . Enthesopathy of hip region   . Esophageal reflux   . Hepatitis, unspecified   . History of blood transfusion    "w/both knee replacements"  . History of duodenal ulcer   . Insomnia, unspecified   . Lumbago   . Migraine    "none since ~ 1990" (06/21/2016)  . Myalgia and myositis, unspecified   . Obesity, unspecified   . Other abnormal blood chemistry   . Other dysphagia   . Other malaise and fatigue   . Other nonspecific abnormal serum enzyme levels   . Other specified cardiac dysrhythmias(427.89)   . Pacemaker   . Pain in joint, ankle and foot   . Pain in joint, hand   . Pain in joint, lower leg   . Pain in joint, pelvic region and thigh   . Pain in limb   . Plantar fascial fibromatosis   . Presence of  permanent cardiac pacemaker   . Reflux esophagitis   . Sciatica   . Spinal stenosis, unspecified region other than cervical   . Stricture and stenosis of esophagus   . Symptomatic menopausal or female climacteric states   . Unspecified essential hypertension   . Unspecified essential hypertension   . Unspecified vitamin D deficiency    Past Surgical History:  Procedure Laterality Date  . BREAST BIOPSY Left 1990s X 2  . CARDIOVERSION N/A 04/26/2016   Procedure: CARDIOVERSION;  Surgeon: Pixie Casino, MD;  Location:  [facility];  Service: Cardiovascular;  Laterality: N/A;  . CATARACT EXTRACTION W/ INTRAOCULAR LENS IMPLANT Left 08/03/1999   DR EPES   . CATARACT EXTRACTION W/ INTRAOCULAR LENS IMPLANT Right 2000   DR EPES  . [facility]  . COLONOSCOPY  1988   Normal   . DENTAL SURGERY Left 08/2016  . EP IMPLANTABLE DEVICE N/A 06/21/2016   Procedure: Pacemaker Implant;  Surgeon: Evans Lance, MD;  Location: [facility];  Service: Cardiovascular;  Laterality: N/A;  . ESOPHAGOGASTRODUODENOSCOPY (EGD) WITH ESOPHAGEAL DILATION  ~ 1982   Dr. Sharlett Iles  . EXCISION OF ACTINIC KERATOSIS     DR LUPTON   . EYE SURGERY    . INSERT / REPLACE / REMOVE PACEMAKER    . JOINT REPLACEMENT    . KNEE ARTHROSCOPY Left 2003  . KNEE ARTHROSCOPY Right 06-26-13  . KNEE CLOSED REDUCTION Right 10/23/2013   Procedure: CLOSED MANIPULATION RIGHT KNEE;  Surgeon: Mcarthur Rossetti, MD;  Location: [facility];  Service: Orthopedics;  Laterality: Right;  . LASER FOR CLOUDY CAP LEFT EYE Left 03/2006   DR DIGBY  . TOTAL KNEE ARTHROPLASTY Left 04/2004   DR RENDALL  . TOTAL KNEE ARTHROPLASTY Right 07/04/2013   Procedure: RIGHT TOTAL KNEE ARTHROPLASTY;  Surgeon: Mcarthur Rossetti, MD;  Location: [facility];  Service: Orthopedics;  Laterality: Right;  . TRIGGER FINGER RELEASE Right   . VAGINAL HYSTERECTOMY  1979    Allergies  Allergen Reactions  . Advil [Ibuprofen]     GI upset  . Aleve [Naproxen Sodium]     GI upset  . Aspirin Nausea And Vomiting    Takes baby aspirin qod without problems  . Atorvastatin     Leg cramps  . Celebrex [Celecoxib]     GI upset  . Diclofenac     GI upset  . Doxycycline     Headache  . Metoclopramide Hcl     REACTION: heart palps  . Nsaids Other (See Comments)    Tears my stomach up   . Other     Antihistamine - increase BP  . Pravastatin     Leg cramps  . Timolol     Dropped HR    Outpatient Encounter Medications as of 01/13/2019  Medication Sig  .  acetaminophen (TYLENOL) 500 MG tablet Take 500 mg by mouth every 6 (six) hours as needed for mild pain.   Marland Kitchen alendronate (FOSAMAX) 70 MG tablet Take 1 tablet (70 mg total) by mouth every 7 (seven) days. Take with a full glass of water on an empty stomach.  . ALPRAZolam (XANAX) 0.25 MG tablet Take one tablet by mouth at bedtime as needed for rest or anxiety  . antiseptic oral rinse (BIOTENE) LIQD 15 mLs by Mouth Rinse route daily as needed for dry mouth.  Marland Kitchen apixaban (ELIQUIS) 5 MG TABS tablet Take 1 tablet (5 mg total) by mouth 2 (two)  times daily.  . baclofen (LIORESAL) 10 MG tablet TAKE 1/2 TO 1 TABLET BY MOUTH EVERY 8 HOURS AS NEEDED FOR SPASM  . bimatoprost (LUMIGAN) 0.01 % SOLN Instill 1 drop into both eyes once daily in the evening  . BIOTIN 5000 PO Take 1 capsule by mouth daily.  . Cholecalciferol (VITAMIN D) 2000 UNITS tablet Take 2,000 Units by mouth daily.  . dorzolamide (TRUSOPT) 2 % ophthalmic solution Place 1 drop into both eyes 3 (three) times daily.   Marland Kitchen esomeprazole (NEXIUM) 20 MG capsule TAKE 1 CAPSULE BY MOUTH EVERY DAY  . ezetimibe (ZETIA) 10 MG tablet TAKE 1 TABLET ONCE DAILY.  . hydrochlorothiazide (HYDRODIURIL) 25 MG tablet Take 1 tablet (25 mg total) by mouth daily.  . Ketoprofen 10 % CREA Apply 1 application topically daily as needed (pain). 20% cream; PRN for pain.  . methylcellulose (ARTIFICIAL TEARS) 1 % ophthalmic solution Place 1 drop into both eyes daily as needed (for dry eyes).   . metoprolol tartrate (LOPRESSOR) 50 MG tablet TAKE 1 TABLET BY MOUTH TWICE A DAY  . propafenone (RYTHMOL SR) 225 MG 12 hr capsule TAKE 1 CAPSULE (225 MG TOTAL) BY MOUTH 2 (TWO) TIMES DAILY.  . traZODone (DESYREL) 50 MG tablet TAKE 1 TABLET BY MOUTH EVERY NIGHT TO HELP WITH SLEEP  . valsartan (DIOVAN) 320 MG tablet Take 1 tablet (320 mg total) by mouth daily.  Marland Kitchen amoxicillin-clavulanate (AUGMENTIN) 875-125 MG tablet Take 1 tablet by mouth 2 (two) times daily for 7 days.  Marland Kitchen  HYDROcodone-Chlorpheniramine 5-4 MG/5ML SOLN Take 5 mLs by mouth every 12 (twelve) hours.  . saccharomyces boulardii (FLORASTOR) 250 MG capsule Take 1 capsule (250 mg total) by mouth 2 (two) times daily for 10 days.  . [DISCONTINUED] acetaminophen-codeine (TYLENOL #3) 300-30 MG tablet Take 1-2 tablets by mouth every 8 (eight) hours as needed for moderate pain.   No facility-administered encounter medications on file as of 01/13/2019.     Review of Systems  Constitutional: Positive for appetite change and fatigue. Negative for chills, fever and unexpected weight change.  HENT: Positive for congestion, sore throat and trouble swallowing. Negative for ear pain, hearing loss, postnasal drip, rhinorrhea, sinus pressure, sinus pain and sneezing.   Eyes: Negative for discharge, redness and itching.  Respiratory: Positive for cough. Negative for chest tightness, shortness of breath and wheezing.   Cardiovascular: Negative for chest pain, palpitations and leg swelling.  Gastrointestinal: Negative for abdominal distention, abdominal pain, constipation, diarrhea, nausea and vomiting.  Genitourinary: Negative for dysuria, frequency and urgency.  Musculoskeletal: Negative for myalgias.  Skin: Negative for color change, pallor and rash.  Neurological: Negative for dizziness, light-headedness and headaches.  Psychiatric/Behavioral: Positive for sleep disturbance. Negative for agitation and confusion. The patient is not nervous/anxious.     Immunization History  Administered Date(s) Administered  . Influenza, High Dose Seasonal PF 09/14/2017, 10/02/2018  . Influenza,inj,Quad PF,6+ Mos 09/23/2013, 09/25/2014, 10/11/2015, 08/30/2016  . Pneumococcal Conjugate-13 02/17/2015  . Pneumococcal Polysaccharide-23 12/12/2016  . Tdap 06/24/2017  . Zoster 01/29/2008  . Zoster Recombinat (Shingrix) 09/04/2017, 02/21/2018   Pertinent  Health Maintenance Due  Topic Date Due  . MAMMOGRAM  03/29/2018  . INFLUENZA  VACCINE  Completed  . DEXA SCAN  Completed  . PNA vac Low Risk Adult  Completed   Fall Risk  01/13/2019 01/06/2019 10/02/2018 05/02/2018 01/02/2018  Falls in the past year? 0 0 No No Yes  Number falls in past yr: 0 0 - - 1  Comment - - - - -  Injury with Fall? 0 0 - - No  Comment - - - - -  Risk Factor Category  - - - - -  Risk for fall due to : - - - - -  Follow up - - - - -  Comment - - - - -    Vitals:   01/13/19 1428  BP: 102/80  Pulse: 64  Resp: 18  Temp: 98.6 F (37 C)  TempSrc: Oral  SpO2: 96%  Weight: 184 lb 3.2 oz (83.6 kg)  Height: 5\' 3"  (1.6 m)   Body mass index is 32.63 kg/m. Physical Exam Vitals signs reviewed.  Constitutional:      General: She is not in acute distress.    Appearance: She is obese.  HENT:     Head: Normocephalic.     Right Ear: Tympanic membrane, ear canal and external ear normal. There is no impacted cerumen.     Left Ear: Tympanic membrane, ear canal and external ear normal. There is no impacted cerumen.     Nose: Nose normal. No congestion or rhinorrhea.     Mouth/Throat:     Mouth: Mucous membranes are moist.     Tongue: No lesions.     Pharynx: Posterior oropharyngeal erythema present. No oropharyngeal exudate.     Tonsils: Tonsillar exudate present. No tonsillar abscesses. Swelling: 2+ on the right. 2+ on the left.     Comments: Tonsil tender to palpation  Eyes:     General: No scleral icterus.       Right eye: No discharge.        Left eye: No discharge.     Extraocular Movements: Extraocular movements intact.     Conjunctiva/sclera: Conjunctivae normal.     Pupils: Pupils are equal, round, and reactive to light.  Neck:     Musculoskeletal: Neck supple. No muscular tenderness.  Cardiovascular:     Rate and Rhythm: Normal rate and regular rhythm.     Pulses: Normal pulses.     Heart sounds: Normal heart sounds. No murmur. No friction rub. No gallop.   Pulmonary:     Effort: Pulmonary effort is normal. No respiratory distress.       Breath sounds: Normal breath sounds. No wheezing, rhonchi or rales.  Chest:     Chest wall: No tenderness.  Abdominal:     General: Bowel sounds are normal. There is no distension.     Palpations: Abdomen is soft. There is no mass.     Tenderness: There is no abdominal tenderness. There is no right CVA tenderness, left CVA tenderness or guarding.  Musculoskeletal: Normal range of motion.        General: No tenderness.     Right lower leg: No edema.     Left lower leg: No edema.  Lymphadenopathy:     Cervical: No cervical adenopathy.  Skin:    General: Skin is warm and dry.     Coloration: Skin is not pale.     Findings: No erythema, lesion or rash.  Neurological:     Mental Status: She is alert and oriented to person, place, and time.     Sensory: No sensory deficit.     Gait: Gait normal.  Psychiatric:        Mood and Affect: Mood normal.        Speech: Speech normal.        Behavior: Behavior normal.        Thought Content: Thought content normal.  Judgment: Judgment normal.     Labs reviewed: Recent Labs    05/07/18 0925 10/02/18 1352  NA 139 135  K 3.5 3.8  CL 103 101  CO2 31 31  GLUCOSE 99 87  BUN 16 14  CREATININE 0.69 0.83  CALCIUM 8.9 8.8   Recent Labs    05/07/18 0925  AST 22  ALT 23  BILITOT 0.5  PROT 6.5   Recent Labs    05/07/18 0925 10/02/18 1352  WBC 4.6 6.1  NEUTROABS 2,047 3,215  HGB 12.6 12.4  HCT 35.9 36.6  MCV 89.3 91.0  PLT 180 171   Lab Results  Component Value Date   TSH 3.29 08/06/2017   Lab Results  Component Value Date   HGBA1C 5.2 08/06/2017   Lab Results  Component Value Date   CHOL 151 05/07/2018   HDL 39 (L) 05/07/2018   LDLCALC 85 05/07/2018   TRIG 160 (H) 05/07/2018   CHOLHDL 3.9 05/07/2018    Significant Diagnostic Results in last 30 days:  Dg Bone Density  Result Date: 01/03/2019 EXAM: DUAL X-RAY ABSORPTIOMETRY (DXA) FOR BONE MINERAL DENSITY IMPRESSION: Referring Physician:  Lauree [name] Your patient completed a BMD test using Lunar IDXA DXA system ( analysis version: 16 ) manufactured by [employer]. Technologist: CG PATIENT: Name: [name], [name] Patient ID: [id number] Birth Date: 24-Apr-1931 Height: 62.0 in. Sex: Female Measured: 01/03/2019 Weight: 187.4 lbs. Indications: Advanced Age, Caucasian, Desyrel, Estrogen Deficient, Family Hist. (Parent hip fracture), Glucocorticoids (Chronic) (255.41), Height Loss (781.91), Hysterectomy, Nexium, Postmenopausal Fractures: None Treatments: Calcium (E943.0), Vitamin D (E933.5) ASSESSMENT: The BMD measured at Forearm Radius 33% is 0.650 g/cm2 with a T-score of -2.7. This patient is considered OSTEOPOROTIC according to [facility]) criteria. The scan quality is good. Lumbar spine was not utilized due to advanced degenerative changes. Site Region Measured Date Measured Age YA T-score BMD Significant CHANGE Left Forearm Radius 33% 01/03/2019 85.6 -2.7 0.650 g/cm2 DualFemur Neck Left 01/03/2019 85.6 -2.6 0.683 g/cm2 DualFemur Total Mean 01/03/2019 85.6 -2.1 0.738 g/cm2 World Health Organization [facility]) criteria for post-menopausal, Caucasian Women: Normal       T-score at or above -1 SD Osteopenia   T-score between -1 and -2.5 SD Osteoporosis T-score at or below -2.5 SD RECOMMENDATION: 1. All patients should optimize calcium and vitamin D intake. 2. Consider FDA approved medical therapies in postmenopausal women and men aged 9 years and older, based on the following: a. A hip or vertebral (clinical or morphometric) fracture b. T- score < or = -2.5 at the femoral neck or spine after appropriate evaluation to exclude secondary causes c. Low bone mass (T-score between -1.0 and -2.5 at the femoral neck or spine) and a 10 year probability of a hip fracture > or = 3% or a 10 year probability of a major osteoporosis-related fracture > or = 20% based on the [place]-adapted WHO algorithm d. Clinician judgment and/or patient preferences may indicate  treatment for people with 10-year fracture probabilities above or below these levels FOLLOW-UP: People with diagnosed cases of osteoporosis or at high risk for fracture should have regular bone mineral density tests. For patients eligible for Medicare, routine testing is allowed once every 2 years. The testing frequency can be increased to one year for patients who have rapidly progressing disease, those who are receiving or discontinuing medical therapy to restore bone mass, or have additional risk factors. I have reviewed this report and agree with the above findings. Mark A. Thornton Papas,  M.D. [facility] Radiology Electronically Signed   By: Lavonia Dana M.D.   On: 01/03/2019 15:15    Assessment/Plan 1. Acute sore throat Afebrile.pharynx erythema. bilateral tonsil tender to palpation with exudate. Take Augmentin 875-125 mg Tablet  one by mouth twice daily for 7 days along with Probiotic Florastor 250 mg capsule one by mouth twice daily x 10 days. Gurgle your throat with warm salt water twice daily.encouraged to Drink plenty of water and soup if unable to eat solid foot. Notify provider's office if symptoms not resolve, worsen or running a fever > 100.5     2. Cough Afebrile.Bilateral lung fields clear to auscultation.Tussionex effective.Needs refill. Tussionex 10-8 mg /5 ml Take 5 ml by mouth every 12 hours as needed for cough e-scribed to pharmacy.  Family/ staff Communication: Reviewed plan of care with patient. Labs/tests ordered: None   Veronica Araque C Vander Kueker, NP

## 2019-01-13 NOTE — Patient Instructions (Signed)
1. Take Augmentin one tablet by mouth twice daily for 7 days along with Probiotic Florastor 250 mg capsule one by mouth twice daily x 10 days. 2. Gurgle your throat with warm salt water twice daily 3.Drink plenty of water and soup 4. Notify provider's office if symptoms not resolve, worsen or running a fever > 100.5     Sore Throat When you have a sore throat, your throat may feel:  Tender.  Burning.  Irritated.  Scratchy.  Painful when you swallow.  Painful when you talk. Many things can cause a sore throat, such as:  An infection.  Allergies.  Dry air.  Smoke or pollution.  Radiation treatment.  Gastroesophageal reflux disease (GERD).  A tumor. A sore throat can be the first sign of another sickness. It can happen with other problems, like:  Coughing.  Sneezing.  Fever.  Swelling in the neck. Most sore throats go away without treatment. Follow these instructions at home:      Take over-the-counter medicines only as told by your doctor. ? If your child has a sore throat, do not give your child aspirin.  Drink enough fluids to keep your pee (urine) pale yellow.  Rest when you feel you need to.  To help with pain: ? Sip warm liquids, such as broth, herbal tea, or warm water. ? Eat or drink cold or frozen liquids, such as frozen ice pops. ? Gargle with a salt-water mixture 3-4 times a day or as needed. To make a salt-water mixture, add -1 tsp (3-6 g) of salt to 1 cup (237 mL) of warm water. Mix it until you cannot see the salt anymore. ? Suck on hard candy or throat lozenges. ? Put a cool-mist humidifier in your bedroom at night. ? Sit in the bathroom with the door closed for 5-10 minutes while you run hot water in the shower.  Do not use any products that contain nicotine or tobacco, such as cigarettes, e-cigarettes, and chewing tobacco. If you need help quitting, ask your doctor.  Wash your hands well and often with soap and water. If soap and water  are not available, use hand sanitizer. Contact a doctor if:  You have a fever for more than 2-3 days.  You keep having symptoms for more than 2-3 days.  Your throat does not get better in 7 days.  You have a fever and your symptoms suddenly get worse.  Your child who is 3 months to 80 years old has a temperature of 102.43F (39C) or higher. Get help right away if:  You have trouble breathing.  You cannot swallow fluids, soft foods, or your saliva.  You have swelling in your throat or neck that gets worse.  You keep feeling sick to your stomach (nauseous).  You keep throwing up (vomiting). Summary  A sore throat is pain, burning, irritation, or scratchiness in the throat. Many things can cause a sore throat.  Take over-the-counter medicines only as told by your doctor. Do not give your child aspirin.  Drink plenty of fluids, and rest as needed.  Contact a doctor if your symptoms get worse or your sore throat does not get better within 7 days. This information is not intended to replace advice given to you by your health care provider. Make sure you discuss any questions you have with your health care provider. Document Released: 09/19/2008 Document Revised: 05/13/2018 Document Reviewed: 05/13/2018 Elsevier Interactive Patient Education  2019 [employer].

## 2019-01-13 NOTE — Telephone Encounter (Signed)
[name] with [facility] called and stated that they cannot get the strength of Tussionex that was E-scribed. Wants to know if the Rx can be changed to Tussionex 10-8mg /20ml.  Pended Rx for approval.

## 2019-01-14 ENCOUNTER — Encounter (INDEPENDENT_AMBULATORY_CARE_PROVIDER_SITE_OTHER): Payer: Medicare Other | Admitting: Physical Medicine and Rehabilitation

## 2019-01-20 ENCOUNTER — Ambulatory Visit (INDEPENDENT_AMBULATORY_CARE_PROVIDER_SITE_OTHER): Payer: Medicare Other | Admitting: Physical Medicine and Rehabilitation

## 2019-01-20 ENCOUNTER — Encounter (INDEPENDENT_AMBULATORY_CARE_PROVIDER_SITE_OTHER): Payer: Self-pay | Admitting: Physical Medicine and Rehabilitation

## 2019-01-20 ENCOUNTER — Ambulatory Visit (INDEPENDENT_AMBULATORY_CARE_PROVIDER_SITE_OTHER): Payer: Self-pay

## 2019-01-20 VITALS — BP 127/70 | HR 62 | Temp 97.9°F

## 2019-01-20 DIAGNOSIS — M48062 Spinal stenosis, lumbar region with neurogenic claudication: Secondary | ICD-10-CM | POA: Diagnosis not present

## 2019-01-20 DIAGNOSIS — M5416 Radiculopathy, lumbar region: Secondary | ICD-10-CM | POA: Diagnosis not present

## 2019-01-20 MED ORDER — BETAMETHASONE SOD PHOS & ACET 6 (3-3) MG/ML IJ SUSP
12.0000 mg | Freq: Once | INTRAMUSCULAR | Status: AC
  Administered 2019-01-20: 12 mg

## 2019-01-20 NOTE — Progress Notes (Signed)
 .  Numeric Pain Rating Scale and Functional Assessment Average Pain 4   In the last MONTH (on 0-10 scale) has pain interfered with the following?  1. General activity like being  able to carry out your everyday physical activities such as walking, climbing stairs, carrying groceries, or moving a chair?  Rating(3)   +Driver, +BT(eliquis, ok for inj), -Dye Allergies.

## 2019-01-20 NOTE — Patient Instructions (Signed)

## 2019-01-28 ENCOUNTER — Ambulatory Visit: Payer: Medicare Other | Admitting: Nurse Practitioner

## 2019-01-28 ENCOUNTER — Encounter: Payer: Self-pay | Admitting: Nurse Practitioner

## 2019-01-28 VITALS — BP 130/80 | HR 62 | Temp 97.9°F | Ht 63.0 in | Wt 186.0 lb

## 2019-01-28 DIAGNOSIS — M810 Age-related osteoporosis without current pathological fracture: Secondary | ICD-10-CM

## 2019-01-28 DIAGNOSIS — J069 Acute upper respiratory infection, unspecified: Secondary | ICD-10-CM | POA: Diagnosis not present

## 2019-01-28 MED ORDER — FLUTICASONE PROPIONATE 50 MCG/ACT NA SUSP: 1.0000 | Freq: Every day | NASAL | 6 refills | Status: DC

## 2019-01-28 MED ORDER — CALCIUM CARB-CHOLECALCIFEROL 600-800 MG-UNIT PO TABS: ORAL_TABLET | ORAL | 0 refills | Status: DC

## 2019-01-28 NOTE — Progress Notes (Signed)
Careteam: Patient Care Team: Lauree Chandler, NP as PCP - General (Geriatric Medicine) Mcarthur Rossetti, MD as Attending Physician (Orthopedic Surgery) Larey Dresser, MD as Attending Physician (Cardiology) Thornell Sartorius, MD as Consulting Physician (Otolaryngology) Druscilla Brownie, MD as Consulting Physician (Dermatology) Calvert Cantor, MD as Consulting Physician (Ophthalmology) Debara Pickett Nadean Corwin, MD as Consulting Physician (Cardiology)  Advanced Directive information    Allergies  Allergen Reactions  . Advil [Ibuprofen]     GI upset  . Aleve [Naproxen Sodium]     GI upset  . Aspirin Nausea And Vomiting    Takes baby aspirin qod without problems  . Atorvastatin     Leg cramps  . Celebrex [Celecoxib]     GI upset  . Diclofenac     GI upset  . Doxycycline     Headache  . Fosamax [Alendronate Sodium]     GI discomfort   . Metoclopramide Hcl     REACTION: heart palps  . Nsaids Other (See Comments)    Tears my stomach up   . Other     Antihistamine - increase BP  . Pravastatin     Leg cramps  . Timolol     Dropped HR    Chief Complaint  Patient presents with  . Follow-up    Discuss osteoporosis treatment, patient d/c'ed fosamax after 4 doses due to stomach upset . Patient would like to discuss other options.   Marland Kitchen URI    Scratchy throat, slight cough and sore mouth.     HPI: Patient is a 85 y.o. female seen in the office today to follow up osteoporosis. She tired fosamax but gave her bad stomach problems/GERD. Had to take tums and protonix due to the symptoms.  Follows with Dr Ernestina Patches due to severe disc disease and gets cortisone injections and wanted to make use aware of this.   Would like to look into Prolia.   Reports she has a rough patch with the flu. States she still has a cough and scratchy throat. Sores on her tongue. sensitive mouth and has to use biotene but had been out of it.  Would like this to be looked at. Reports she is  hoarse.    Review of Systems:  Review of Systems  Constitutional: Negative for chills, fever, malaise/fatigue and weight loss.  HENT: Positive for sore throat. Negative for congestion, ear discharge, ear pain and sinus pain.   Respiratory: Positive for cough and sputum production (improved). Negative for shortness of breath.   Cardiovascular: Negative for chest pain and palpitations.  Gastrointestinal: Negative for abdominal pain, constipation, diarrhea and heartburn.  Musculoskeletal: Positive for back pain and myalgias.   Past Medical History:  Diagnosis Date  . Allergic rhinitis due to pollen   . Anxiety state, unspecified   . Arthritis    "right knee" (06/21/2016)  . Asymptomatic varicose veins   . Carpal tunnel syndrome   . Chest pain, unspecified   . Chronic lower back pain    "on the left side" (06/21/2016)  . Cramp of limb   . Depressive disorder, not elsewhere classified   . Diaphragmatic hernia without mention of obstruction or gangrene   . Diaphragmatic hernia without mention of obstruction or gangrene   . Disturbance of skin sensation   . Diverticulosis of colon (without mention of hemorrhage)   . Enthesopathy of hip region   . Esophageal reflux   . Hepatitis, unspecified   . History of blood transfusion    "  w/both knee replacements"  . History of duodenal ulcer   . Insomnia, unspecified   . Lumbago   . Migraine    "none since ~ 1990" (06/21/2016)  . Myalgia and myositis, unspecified   . Obesity, unspecified   . Other abnormal blood chemistry   . Other dysphagia   . Other malaise and fatigue   . Other nonspecific abnormal serum enzyme levels   . Other specified cardiac dysrhythmias(427.89)   . Pacemaker   . Pain in joint, ankle and foot   . Pain in joint, hand   . Pain in joint, lower leg   . Pain in joint, pelvic region and thigh   . Pain in limb   . Plantar fascial fibromatosis   . Presence of permanent cardiac pacemaker   . Reflux esophagitis   .  Sciatica   . Spinal stenosis, unspecified region other than cervical   . Stricture and stenosis of esophagus   . Symptomatic menopausal or female climacteric states   . Unspecified essential hypertension   . Unspecified essential hypertension   . Unspecified vitamin D deficiency    Past Surgical History:  Procedure Laterality Date  . BREAST BIOPSY Left 1990s X 2  . CARDIOVERSION N/A 04/26/2016   Procedure: CARDIOVERSION;  Surgeon: Pixie Casino, MD;  Location: [facility];  Service: Cardiovascular;  Laterality: N/A;  . CATARACT EXTRACTION W/ INTRAOCULAR LENS IMPLANT Left 08/03/1999   DR EPES   . CATARACT EXTRACTION W/ INTRAOCULAR LENS IMPLANT Right 2000   DR EPES  . [place]  . COLONOSCOPY  1988   Normal   . DENTAL SURGERY Left 08/2016  . EP IMPLANTABLE DEVICE N/A 06/21/2016   Procedure: Pacemaker Implant;  Surgeon: Evans Lance, MD;  Location: [facility];  Service: Cardiovascular;  Laterality: N/A;  . ESOPHAGOGASTRODUODENOSCOPY (EGD) WITH ESOPHAGEAL DILATION  ~ 1982   Dr. Sharlett Iles  . EXCISION OF ACTINIC KERATOSIS     DR LUPTON   . EYE SURGERY    . INSERT / REPLACE / REMOVE PACEMAKER    . JOINT REPLACEMENT    . KNEE ARTHROSCOPY Left 2003  . KNEE ARTHROSCOPY Right 06-26-13  . KNEE CLOSED REDUCTION Right 10/23/2013   Procedure: CLOSED MANIPULATION RIGHT KNEE;  Surgeon: Mcarthur Rossetti, MD;  Location: [facility];  Service: Orthopedics;  Laterality: Right;  . LASER FOR CLOUDY CAP LEFT EYE Left 03/2006   DR DIGBY  . TOTAL KNEE ARTHROPLASTY Left 04/2004   DR RENDALL  . TOTAL KNEE ARTHROPLASTY Right 07/04/2013   Procedure: RIGHT TOTAL KNEE ARTHROPLASTY;  Surgeon: Mcarthur Rossetti, MD;  Location: [facility];  Service: Orthopedics;  Laterality: Right;  . TRIGGER FINGER RELEASE Right   . VAGINAL HYSTERECTOMY  1979   Social History:   reports that she has never smoked. She has never used smokeless tobacco. She reports that she does not drink  alcohol or use drugs.  Family History  Problem Relation Age of Onset  . Ovarian cancer Mother   . Uterine cancer Mother   . Cerebrovascular Accident Mother   . Heart disease Father   . Hypertension Brother   . Obesity Daughter     Medications: Patient's Medications  New Prescriptions   No medications on file  Previous Medications   ACETAMINOPHEN (TYLENOL) 500 MG TABLET    Take 500 mg by mouth every 6 (six) hours as needed for mild pain.    ALPRAZOLAM (XANAX) 0.25 MG TABLET  Take one tablet by mouth at bedtime as needed for rest or anxiety   ANTISEPTIC ORAL RINSE (BIOTENE) LIQD    15 mLs by Mouth Rinse route daily as needed for dry mouth.   APIXABAN (ELIQUIS) 5 MG TABS TABLET    Take 1 tablet (5 mg total) by mouth 2 (two) times daily.   BIMATOPROST (LUMIGAN) 0.01 % SOLN    Instill 1 drop into both eyes once daily in the evening   BIOTIN 5000 PO    Take 1 capsule by mouth daily.   CHOLECALCIFEROL (VITAMIN D) 2000 UNITS TABLET    Take 2,000 Units by mouth daily.   DORZOLAMIDE (TRUSOPT) 2 % OPHTHALMIC SOLUTION    Place 1 drop into both eyes 3 (three) times daily.    ESOMEPRAZOLE (NEXIUM) 20 MG CAPSULE    TAKE 1 CAPSULE BY MOUTH EVERY DAY   EZETIMIBE (ZETIA) 10 MG TABLET    TAKE 1 TABLET ONCE DAILY.   HYDROCHLOROTHIAZIDE (HYDRODIURIL) 25 MG TABLET    Take 1 tablet (25 mg total) by mouth daily.   KETOPROFEN 10 % CREA    Apply 1 application topically daily as needed (pain). 20% cream; PRN for pain.   METHYLCELLULOSE (ARTIFICIAL TEARS) 1 % OPHTHALMIC SOLUTION    Place 1 drop into both eyes daily as needed (for dry eyes).    METOPROLOL TARTRATE (LOPRESSOR) 50 MG TABLET    TAKE 1 TABLET BY MOUTH TWICE A DAY   PROPAFENONE (RYTHMOL SR) 225 MG 12 HR CAPSULE    TAKE 1 CAPSULE (225 MG TOTAL) BY MOUTH 2 (TWO) TIMES DAILY.   TRAZODONE (DESYREL) 50 MG TABLET    TAKE 1 TABLET BY MOUTH EVERY NIGHT TO HELP WITH SLEEP   VALSARTAN (DIOVAN) 320 MG TABLET    Take 1 tablet (320 mg total) by mouth daily.    Modified Medications   No medications on file  Discontinued Medications   ALENDRONATE (FOSAMAX) 70 MG TABLET    Take 1 tablet (70 mg total) by mouth every 7 (seven) days. Take with a full glass of water on an empty stomach.   BACLOFEN (LIORESAL) 10 MG TABLET    TAKE 1/2 TO 1 TABLET BY MOUTH EVERY 8 HOURS AS NEEDED FOR SPASM   CHLORPHENIRAMINE-HYDROCODONE (TUSSIONEX PENNKINETIC ER) 10-8 MG/5ML SUER    Take 5 mLs by mouth every 12 (twelve) hours as needed for cough.   CHLORPHENIRAMINE-HYDROCODONE (TUSSIONEX PENNKINETIC ER) 10-8 MG/5ML SUER    Take 5 mLs by mouth every 12 (twelve) hours as needed for cough.     Physical Exam:  Vitals:   01/28/19 1420  BP: 130/80  Pulse: 62  Temp: 97.9 F (36.6 C)  TempSrc: Oral  SpO2: 98%  Weight: 186 lb (84.4 kg)  Height: 5\' 3"  (1.6 m)   Body mass index is 32.95 kg/m.  Physical Exam Vitals signs reviewed.  Constitutional:      General: She is not in acute distress.    Appearance: She is obese.  HENT:     Head: Normocephalic.     Right Ear: Tympanic membrane, ear canal and external ear normal. There is no impacted cerumen.     Left Ear: Tympanic membrane, ear canal and external ear normal. There is no impacted cerumen.     Nose: Nose normal. No congestion or rhinorrhea.     Mouth/Throat:     Mouth: Mucous membranes are moist.     Tongue: No lesions.     Pharynx: No oropharyngeal exudate or posterior  oropharyngeal erythema.     Tonsils: No tonsillar exudate or tonsillar abscesses. Swelling: 2+ on the right. 2+ on the left.  Eyes:     General: No scleral icterus.       Right eye: No discharge.        Left eye: No discharge.     Extraocular Movements: Extraocular movements intact.     Conjunctiva/sclera: Conjunctivae normal.     Pupils: Pupils are equal, round, and reactive to light.  Neck:     Musculoskeletal: Neck supple. No muscular tenderness.  Cardiovascular:     Rate and Rhythm: Normal rate and regular rhythm.     Pulses: Normal  pulses.     Heart sounds: Normal heart sounds. No murmur. No friction rub. No gallop.   Pulmonary:     Effort: Pulmonary effort is normal. No respiratory distress.     Breath sounds: Normal breath sounds. No wheezing, rhonchi or rales.  Chest:     Chest wall: No tenderness.  Abdominal:     General: Bowel sounds are normal. There is no distension.     Palpations: Abdomen is soft. There is no mass.     Tenderness: There is no abdominal tenderness. There is no right CVA tenderness, left CVA tenderness or guarding.  Musculoskeletal: Normal range of motion.        General: No tenderness.     Right lower leg: No edema.     Left lower leg: No edema.  Lymphadenopathy:     Cervical: No cervical adenopathy.  Skin:    General: Skin is warm and dry.     Coloration: Skin is not pale.     Findings: No erythema, lesion or rash.  Neurological:     Mental Status: She is alert and oriented to person, place, and time.     Sensory: No sensory deficit.     Gait: Gait normal.  Psychiatric:        Mood and Affect: Mood normal.        Speech: Speech normal.        Behavior: Behavior normal.        Thought Content: Thought content normal.        Judgment: Judgment normal.     Labs reviewed: Basic Metabolic Panel: Recent Labs    05/07/18 0925 10/02/18 1352  NA 139 135  K 3.5 3.8  CL 103 101  CO2 31 31  GLUCOSE 99 87  BUN 16 14  CREATININE 0.69 0.83  CALCIUM 8.9 8.8   Liver Function Tests: Recent Labs    05/07/18 0925  AST 22  ALT 23  BILITOT 0.5  PROT 6.5   No results for input(s): LIPASE, AMYLASE in the last 8760 hours. No results for input(s): AMMONIA in the last 8760 hours. CBC: Recent Labs    05/07/18 0925 10/02/18 1352  WBC 4.6 6.1  NEUTROABS 2,047 3,215  HGB 12.6 12.4  HCT 35.9 36.6  MCV 89.3 91.0  PLT 180 171   Lipid Panel: Recent Labs    05/07/18 0925  CHOL 151  HDL 39*  LDLCALC 85  TRIG 160*  CHOLHDL 3.9   TSH: No results for input(s): TSH in the last  8760 hours. A1C: Lab Results  Component Value Date   HGBA1C 5.2 08/06/2017     Assessment/Plan 1. Upper respiratory tract infection, unspecified type Overall improving. Completed Augmentin prescribed by Dinah NP  -contines to have nasal congestion and occasional cough -can use mucinex DM by mouth  twice daily -increase hydration  - fluticasone (FLONASE) 50 MCG/ACT nasal spray; Place 1 spray into both nostrils daily.  Dispense: 16 g; Refill: 6  2. Osteoporosis, unspecified osteoporosis type, unspecified pathological fracture presence Could not tolerate fosamax due to side effects, continues on cal and d with weight bearing activity encouraged -will do prior auth for prolia - Calcium Carb-Cholecalciferol (CALTRATE 600+D3) 600-800 MG-UNIT TABS; 1 tablet twice daily  Dispense: 60 tablet; Refill: 0  Next appt: as scheduled, sooner if needed  Caide Campi K. [place], [facility] [phone]

## 2019-01-28 NOTE — Patient Instructions (Addendum)
To use mucinex DM by mouth twice daily as needed for cough and congestion, to take a full glass of water with medication To use flonase 1 spray into both nares daily for nasal congestion Continue to stay hydrated Notify if symptoms worsen.   We will do an authorization for prolia   Recommended to take caltrate with D 600/400 twice a day.

## 2019-01-30 DIAGNOSIS — H01002 Unspecified blepharitis right lower eyelid: Secondary | ICD-10-CM | POA: Diagnosis not present

## 2019-01-30 DIAGNOSIS — H40013 Open angle with borderline findings, low risk, bilateral: Secondary | ICD-10-CM | POA: Diagnosis not present

## 2019-01-30 DIAGNOSIS — H01004 Unspecified blepharitis left upper eyelid: Secondary | ICD-10-CM | POA: Diagnosis not present

## 2019-01-30 DIAGNOSIS — H01001 Unspecified blepharitis right upper eyelid: Secondary | ICD-10-CM | POA: Diagnosis not present

## 2019-02-04 ENCOUNTER — Telehealth (INDEPENDENT_AMBULATORY_CARE_PROVIDER_SITE_OTHER): Payer: Self-pay | Admitting: Physical Medicine and Rehabilitation

## 2019-02-04 NOTE — Telephone Encounter (Signed)
Not quite 2 weeks yet, maybe all we get from injection standpoint.

## 2019-02-07 ENCOUNTER — Telehealth: Payer: Self-pay

## 2019-02-07 NOTE — Telephone Encounter (Signed)
Patient called stating she has changed her mind about Prolia. Patient has put some thought into it and did a little research.  1.) Patient concerned about cost, 500 dollars per year vs 36 dollars per year for Fosamax 2.) Patient seen potential side effect of pain and she can not afford to put herself at risk for more pain than she is already experiencing on a daily basis 3.) Patient not thrilled that this will be a life time medication. Patient hopes to live about 10 more years and does not wish to commit to getting an injection for the next 10 years  Patient plans to switch back to fosamax despite GI side effects  FYI

## 2019-02-10 ENCOUNTER — Ambulatory Visit (INDEPENDENT_AMBULATORY_CARE_PROVIDER_SITE_OTHER): Payer: Self-pay

## 2019-02-10 ENCOUNTER — Ambulatory Visit (INDEPENDENT_AMBULATORY_CARE_PROVIDER_SITE_OTHER): Payer: Medicare Other | Admitting: Physician Assistant

## 2019-02-10 ENCOUNTER — Other Ambulatory Visit (INDEPENDENT_AMBULATORY_CARE_PROVIDER_SITE_OTHER): Payer: Self-pay

## 2019-02-10 ENCOUNTER — Encounter (INDEPENDENT_AMBULATORY_CARE_PROVIDER_SITE_OTHER): Payer: Self-pay | Admitting: Physician Assistant

## 2019-02-10 DIAGNOSIS — G8929 Other chronic pain: Secondary | ICD-10-CM

## 2019-02-10 DIAGNOSIS — M5441 Lumbago with sciatica, right side: Principal | ICD-10-CM

## 2019-02-10 DIAGNOSIS — M25561 Pain in right knee: Secondary | ICD-10-CM

## 2019-02-10 DIAGNOSIS — M5442 Lumbago with sciatica, left side: Principal | ICD-10-CM

## 2019-02-10 NOTE — Progress Notes (Signed)
Office Visit Note   Patient: [name]           Date of Birth: 1930-12-27           MRN: [id number] Visit Date: 02/10/2019              Requested by: Lauree Chandler, NP [place], [place] [address] PCP: Lauree Chandler, NP   Assessment & Plan: Visit Diagnoses:  1. Chronic pain of right knee     Plan: I explained to [name] feel that her pain down both legs is coming from her back.  Clinically and radiographically her right total knee arthroplasty appears to be doing well.  Will refer her to a neurosurgeon.  She will follow-up with [place] on an as-needed basis.  Follow-Up Instructions: Return if symptoms worsen or fail to improve.   Orders:  Orders Placed This Encounter  Procedures  . XR Knee 1-2 Views Right   No orders of the defined types were placed in this encounter.     Procedures: No procedures performed   Clinical Data: No additional findings.   Subjective: Chief Complaint  Patient presents with  . Right Knee - Pain    HPI [name] comes in today due to right knee pain.  She is actually drawn some pictures that show pain down the entire right leg and down the left leg also.  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  She is undergone epidural steroid injections in the past by Dr. Ernestina Patches.  She states these get rid of most of her pain for 3 to 5 days and then her pain comes back.  She is status post right total knee arthroplasty 07/04/2013.  She is wondering if something may be going on with her right knee that is causing the pain that she is having about the knee.  Said no new injury to the knee. Review of Systems Please see HPI  Objective: Vital Signs: There were no vitals taken for this visit.  Physical Exam Constitutional:      Appearance: She is not ill-appearing or diaphoretic.  Cardiovascular:     Pulses: Normal pulses.  Pulmonary:     Effort: Pulmonary effort is normal.    Neurological:     Mental Status: She is alert and oriented to person, place, and time.     Ortho Exam Right knee no effusion abnormal warmth erythema.  Surgical incisions well-healed.  Right calf supple nontender.  No instability valgus varus stressing.  Anterior drawer is negative.  Full range of motion without pain.  Right hip tenderness over her greater trochanteric region. Specialty Comments:  No specialty comments available.  Imaging: Xr Knee 1-2 Views Right  Result Date: 02/10/2019 Right knee AP lateral views: No acute fractures.  Right total knee arthroplasty components are well-seated without any evidence of complication.    PMFS History: Patient Active Problem List   Diagnosis Date Noted  . Osteoporosis 01/28/2019  . Trochanteric bursitis, right hip 05/16/2017  . Cardiac pacemaker in situ 06/21/2016  . Atypical atrial flutter ([place])   . Chest pain 04/10/2016  . Atrial fibrillation ([place]) 03/29/2016  . Hyperglycemia 02/17/2015  . Obese 02/17/2015  . Insomnia 01/27/2014  . 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 Knee joint replacement by other means 10/01/2013  . Arthritis of knee, right 07/04/2013  . Osteoarthritis of right knee 07/01/2013  . Cervicalgia 07/01/2013  . Bradycardia 05/13/2013  . HYPERCHOLESTEROLEMIA  06/02/2009  . ANXIETY 06/02/2009  . Essential hypertension 06/02/2009  . ESOPHAGEAL STRICTURE 06/02/2009  . GERD 06/02/2009  . HIATAL HERNIA 06/02/2009  . DIVERTICULOSIS, COLON 06/02/2009  . Dysphagia, pharyngoesophageal phase 06/02/2009  . Backache 05/01/2007  . LEG PAIN, RIGHT 05/01/2007   Past Medical History:  Diagnosis Date  . Allergic rhinitis due to pollen   . Anxiety state, unspecified   . Arthritis    "right knee" (06/21/2016)  . Asymptomatic varicose veins   . Carpal tunnel syndrome   . Chest pain, unspecified   . Chronic lower back pain    "on the left side" (06/21/2016)  . Cramp of limb   . Depressive disorder, not elsewhere  classified   . Diaphragmatic hernia without mention of obstruction or gangrene   . Diaphragmatic hernia without mention of obstruction or gangrene   . Disturbance of skin sensation   . Diverticulosis of colon (without mention of hemorrhage)   . Enthesopathy of hip region   . Esophageal reflux   . Hepatitis, unspecified   . History of blood transfusion    "w/both knee replacements"  . History of duodenal ulcer   . Insomnia, unspecified   . Lumbago   . Migraine    "none since ~ 1990" (06/21/2016)  . Myalgia and myositis, unspecified   . Obesity, unspecified   . Other abnormal blood chemistry   . Other dysphagia   . Other malaise and fatigue   . Other nonspecific abnormal serum enzyme levels   . Other specified cardiac dysrhythmias(427.89)   . Pacemaker   . Pain in joint, ankle and foot   . Pain in joint, hand   . Pain in joint, lower leg   . Pain in joint, pelvic region and thigh   . Pain in limb   . Plantar fascial fibromatosis   . Presence of permanent cardiac pacemaker   . Reflux esophagitis   . Sciatica   . Spinal stenosis, unspecified region other than cervical   . Stricture and stenosis of esophagus   . Symptomatic menopausal or female climacteric states   . Unspecified essential hypertension   . Unspecified essential hypertension   . Unspecified vitamin D deficiency     Family History  Problem Relation Age of Onset  . Ovarian cancer Mother   . Uterine cancer Mother   . Cerebrovascular Accident Mother   . Heart disease Father   . Hypertension Brother   . Obesity Daughter     Past Surgical History:  Procedure Laterality Date  . BREAST BIOPSY Left 1990s X 2  . CARDIOVERSION N/A 04/26/2016   Procedure: CARDIOVERSION;  Surgeon: Pixie Casino, MD;  Location: [facility];  Service: Cardiovascular;  Laterality: N/A;  . CATARACT EXTRACTION W/ INTRAOCULAR LENS IMPLANT Left 08/03/1999   DR EPES   . CATARACT EXTRACTION W/ INTRAOCULAR LENS IMPLANT Right 2000   DR EPES    . [place]  . COLONOSCOPY  1988   Normal   . DENTAL SURGERY Left 08/2016  . EP IMPLANTABLE DEVICE N/A 06/21/2016   Procedure: Pacemaker Implant;  Surgeon: Evans Lance, MD;  Location: [facility];  Service: Cardiovascular;  Laterality: N/A;  . ESOPHAGOGASTRODUODENOSCOPY (EGD) WITH ESOPHAGEAL DILATION  ~ 1982   Dr. Sharlett Iles  . EXCISION OF ACTINIC KERATOSIS     DR LUPTON   . EYE SURGERY    . INSERT / REPLACE / REMOVE PACEMAKER    . JOINT REPLACEMENT    .  KNEE ARTHROSCOPY Left 2003  . KNEE ARTHROSCOPY Right 06-26-13  . KNEE CLOSED REDUCTION Right 10/23/2013   Procedure: CLOSED MANIPULATION RIGHT KNEE;  Surgeon: Mcarthur Rossetti, MD;  Location: [facility];  Service: Orthopedics;  Laterality: Right;  . LASER FOR CLOUDY CAP LEFT EYE Left 03/2006   DR DIGBY  . TOTAL KNEE ARTHROPLASTY Left 04/2004   DR RENDALL  . TOTAL KNEE ARTHROPLASTY Right 07/04/2013   Procedure: RIGHT TOTAL KNEE ARTHROPLASTY;  Surgeon: Mcarthur Rossetti, MD;  Location[facility];  Service: Orthopedics;  Laterality: Right;  . TRIGGER FINGER RELEASE Right   . VAGINAL HYSTERECTOMY  1979   Social History   Occupational History  . Not on file  Tobacco Use  . Smoking status: Never Smoker  . Smokeless tobacco: Never Used  Substance and Sexual Activity  . Alcohol use: No  . Drug use: No  . Sexual activity: Yes

## 2019-02-11 NOTE — Telephone Encounter (Signed)
Fosamax added to medication list

## 2019-02-11 NOTE — Telephone Encounter (Signed)
Noted. Can we update her medication list.

## 2019-02-12 NOTE — Procedures (Signed)
Lumbosacral Transforaminal Epidural Steroid Injection - Sub-Pedicular Approach with Fluoroscopic Guidance  Patient: [name]      Date of Birth: [birth year] MRN: [id number] PCP: Lauree Chandler, NP      Visit Date: 12/10/2018   Universal Protocol:    Date/Time: 12/10/2018  Consent Given By: the patient  Position: PRONE  Additional Comments: Vital signs were monitored before and after the procedure. Patient was prepped and draped in the usual sterile fashion. The correct patient, procedure, and site was verified.   Injection Procedure Details:  Procedure Site One Meds Administered:  Meds ordered this encounter  Medications  . betamethasone acetate-betamethasone sodium phosphate (CELESTONE) injection 12 mg    Laterality: Right  Location/Site:  L5-S1  Needle size: 22 G  Needle type: Spinal  Needle Placement: Transforaminal  Findings:    -Comments: Excellent flow of contrast along the nerve and into the epidural space.  Procedure Details: After squaring off the end-plates to get a true AP view, the C-arm was positioned so that an oblique view of the foramen as noted above was visualized. The target area is just inferior to the "nose of the scotty dog" or sub pedicular. The soft tissues overlying this structure were infiltrated with 2-3 ml. of 1% Lidocaine without Epinephrine.  The spinal needle was inserted toward the target using a "trajectory" view along the fluoroscope beam.  Under AP and lateral visualization, the needle was advanced so it did not puncture dura and was located close the 6 O'Clock position of the pedical in AP tracterory. Biplanar projections were used to confirm position. Aspiration was confirmed to be negative for CSF and/or blood. A 1-2 ml. volume of Isovue-250 was injected and flow of contrast was noted at each level. Radiographs were obtained for documentation purposes.   After attaining the desired flow of contrast documented above, a 0.5  to 1.0 ml test dose of 0.25% Marcaine was injected into each respective transforaminal space.  The patient was observed for 90 seconds post injection.  After no sensory deficits were reported, and normal lower extremity motor function was noted,   the above injectate was administered so that equal amounts of the injectate were placed at each foramen (level) into the transforaminal epidural space.   Additional Comments:  The patient tolerated the procedure well Dressing: Band-Aid    Post-procedure details: Patient was observed during the procedure. Post-procedure instructions were reviewed.  Patient left the clinic in stable condition.

## 2019-02-12 NOTE — Progress Notes (Signed)
[name] - 85 y.o. female MRN [id number]  Date of birth: [birth year]  Office Visit Note: Visit Date: 12/10/2018 PCP: Lauree Chandler, NP Referred by: Lauree Chandler, NP  Subjective: Chief Complaint  Patient presents with  . Right Leg - Pain   HPI: [name] is a 85 y.o. female who comes in today For planned transforaminal epidural steroid injection for continued right radicular leg pain.  Patient had bilateral L4 transforaminal injection performed about a month ago with good relief of her left-sided symptoms and good relief of the right-sided symptoms but the right-sided symptoms returned fairly quickly.  She endorses pain into the right leg but the numbness and tingling in the feet really has gone away at this point and she is pretty happy about that as well as the cramping has decreased quite a bit.  She still continues to have multiple questions at each visit which I do try to take the time to answer.  She has a degenerative spine with stenosis and spondylosis and facet arthropathy.  L4-5 region is the worst but her symptoms on the right are more L5 related.  We will complete right L5 transforaminal injection today diagnostically and therapeutically.  Hopefully we get to a point where she is satisfied with the amount of pain relief.  At 87 she would represent a difficult surgical case but not impossible.  ROS Otherwise per HPI.  Assessment & Plan: Visit Diagnoses:  1. Lumbar radiculopathy   2. 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   Meds & Orders:  Meds ordered this encounter  Medications  . betamethasone acetate-betamethasone sodium phosphate (CELESTONE) injection 12 mg    Orders Placed This Encounter  Procedures  . XR C-ARM NO REPORT  . Epidural Steroid injection    Follow-up: Return if symptoms worsen or fail to improve.   Procedures: No procedures performed  Lumbosacral Transforaminal Epidural  Steroid Injection - Sub-Pedicular Approach with Fluoroscopic Guidance  Patient: [name]      Date of Birth: 1931/06/29 MRN: [id number] PCP: Lauree Chandler, NP      Visit Date: 12/10/2018   Universal Protocol:    Date/Time: 12/10/2018  Consent Given By: the patient  Position: PRONE  Additional Comments: Vital signs were monitored before and after the procedure. Patient was prepped and draped in the usual sterile fashion. The correct patient, procedure, and site was verified.   Injection Procedure Details:  Procedure Site One Meds Administered:  Meds ordered this encounter  Medications  . betamethasone acetate-betamethasone sodium phosphate (CELESTONE) injection 12 mg    Laterality: Right  Location/Site:  L5-S1  Needle size: 22 G  Needle type: Spinal  Needle Placement: Transforaminal  Findings:    -Comments: Excellent flow of contrast along the nerve and into the epidural space.  Procedure Details: After squaring off the end-plates to get a true AP view, the C-arm was positioned so that an oblique view of the foramen as noted above was visualized. The target area is just inferior to the "nose of the scotty dog" or sub pedicular. The soft tissues overlying this structure were infiltrated with 2-3 ml. of 1% Lidocaine without Epinephrine.  The spinal needle was inserted toward the target using a "trajectory" view along the fluoroscope beam.  Under AP and lateral visualization, the needle was advanced so it did not puncture dura and was located close the 6 O'Clock position of the pedical in AP  tracterory. Biplanar projections were used to confirm position. Aspiration was confirmed to be negative for CSF and/or blood. A 1-2 ml. volume of Isovue-250 was injected and flow of contrast was noted at each level. Radiographs were obtained for documentation purposes.   After attaining the desired flow of contrast documented above, a 0.5 to 1.0 ml test dose of 0.25% Marcaine  was injected into each respective transforaminal space.  The patient was observed for 90 seconds post injection.  After no sensory deficits were reported, and normal lower extremity motor function was noted,   the above injectate was administered so that equal amounts of the injectate were placed at each foramen (level) into the transforaminal epidural space.   Additional Comments:  The patient tolerated the procedure well Dressing: Band-Aid    Post-procedure details: Patient was observed during the procedure. Post-procedure instructions were reviewed.  Patient left the clinic in stable condition.    Clinical History: LUMBAR SPINE MRI FINDINGS:  06/03/2015   . The conus medullaris appears normal, terminating at the L1 level. No abnormal signal demonstrated within the visualized distal spinal cord. Cauda equina unremarkable.  . Vertebral body heights maintained. Marrow signal within normal limits for age.Alignment anatomic.  . The visualized lower thoracic spine demonstrates no disc herniation, significant central canal or foraminal stenosis.  . T12-L1: No disc herniation, significant central canal or foraminal stenosis. Marland Kitchen L1-L2: Small right paracentral disc extrusion seen only on sagittal images. No definite stenosis . L2-L3: No disc herniation, significant central canal or foraminal stenosis. Marland Kitchen L3-L4: Small inferiorly migrating central and right paracentral disc extrusion with potential encroachment of the right descending L4 nerve root . L4-L5: No disc herniation, significant central canal or foraminal stenosis. Marland Kitchen L5-L6: Grade 1 anterolisthesis. Severe facet arthrosis and ligament flavum thickening. Trefoil configuration of the central canal consistent with moderately severe central canal stenosis . L6-S2: No disc herniation, significant central canal or foraminal stenosis.  . Soft tissues are grossly unremarkable   IMPRESSION: 6 lumbar type vertebral bodies, lowest labeled L6  for the purposes of this report.  Grade 1 anterolisthesis of L5 on L6. Severe facet arthrosis and ligament flavum thickening. Trefoil configuration of the central canal consistent with moderately severe central canal stenosis  Small inferiorly migrating central and right paracentral disc extrusion at L3-4 with potential encroachment of the right descending L4 nerve root  Small right paracentral disc extrusion at L1-2 seen only on sagittal images. No definite stenosis   She reports that she has never smoked. She has never used smokeless tobacco. No results for input(s): HGBA1C, LABURIC in the last 8760 hours.  Objective:  VS:  HT:    WT:   BMI:     BP:137/77  HR:62bpm  TEMP:97.9 F (36.6 C)(Oral)  RESP:  Physical Exam  Ortho Exam Imaging: No results found.  Past Medical/Family/Surgical/Social History: Medications & Allergies reviewed per EMR, new medications updated. Patient Active Problem List   Diagnosis Date Noted  . Osteoporosis 01/28/2019  . Trochanteric bursitis, right hip 05/16/2017  . Cardiac pacemaker in situ 06/21/2016  . Atypical atrial flutter ([place])   . Chest pain 04/10/2016  . Atrial fibrillation ([place]) 03/29/2016  . Hyperglycemia 02/17/2015  . Obese 02/17/2015  . Insomnia 01/27/2014  . 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 Knee joint replacement by other means 10/01/2013  . Arthritis of knee, right 07/04/2013  . Osteoarthritis of right knee 07/01/2013  . Cervicalgia 07/01/2013  . Bradycardia 05/13/2013  . HYPERCHOLESTEROLEMIA 06/02/2009  . ANXIETY 06/02/2009  .  Essential hypertension 06/02/2009  . ESOPHAGEAL STRICTURE 06/02/2009  . GERD 06/02/2009  . HIATAL HERNIA 06/02/2009  . DIVERTICULOSIS, COLON 06/02/2009  . Dysphagia, pharyngoesophageal phase 06/02/2009  . Backache 05/01/2007  . LEG PAIN, RIGHT 05/01/2007   Past Medical History:  Diagnosis Date  . Allergic rhinitis due to pollen   . Anxiety state, unspecified   . Arthritis    "right knee"  (06/21/2016)  . Asymptomatic varicose veins   . Carpal tunnel syndrome   . Chest pain, unspecified   . Chronic lower back pain    "on the left side" (06/21/2016)  . Cramp of limb   . Depressive disorder, not elsewhere classified   . Diaphragmatic hernia without mention of obstruction or gangrene   . Diaphragmatic hernia without mention of obstruction or gangrene   . Disturbance of skin sensation   . Diverticulosis of colon (without mention of hemorrhage)   . Enthesopathy of hip region   . Esophageal reflux   . Hepatitis, unspecified   . History of blood transfusion    "w/both knee replacements"  . History of duodenal ulcer   . Insomnia, unspecified   . Lumbago   . Migraine    "none since ~ 1990" (06/21/2016)  . Myalgia and myositis, unspecified   . Obesity, unspecified   . Other abnormal blood chemistry   . Other dysphagia   . Other malaise and fatigue   . Other nonspecific abnormal serum enzyme levels   . Other specified cardiac dysrhythmias(427.89)   . Pacemaker   . Pain in joint, ankle and foot   . Pain in joint, hand   . Pain in joint, lower leg   . Pain in joint, pelvic region and thigh   . Pain in limb   . Plantar fascial fibromatosis   . Presence of permanent cardiac pacemaker   . Reflux esophagitis   . Sciatica   . Spinal stenosis, unspecified region other than cervical   . Stricture and stenosis of esophagus   . Symptomatic menopausal or female climacteric states   . Unspecified essential hypertension   . Unspecified essential hypertension   . Unspecified vitamin D deficiency    Family History  Problem Relation Age of Onset  . Ovarian cancer Mother   . Uterine cancer Mother   . Cerebrovascular Accident Mother   . Heart disease Father   . Hypertension Brother   . Obesity Daughter    Past Surgical History:  Procedure Laterality Date  . BREAST BIOPSY Left 1990s X 2  . CARDIOVERSION N/A 04/26/2016   Procedure: CARDIOVERSION;  Surgeon: Pixie Casino, MD;   Location: [facility];  Service: Cardiovascular;  Laterality: N/A;  . CATARACT EXTRACTION W/ INTRAOCULAR LENS IMPLANT Left 08/03/1999   DR EPES   . CATARACT EXTRACTION W/ INTRAOCULAR LENS IMPLANT Right 2000   DR EPES  . [place]  . COLONOSCOPY  1988   Normal   . DENTAL SURGERY Left 08/2016  . EP IMPLANTABLE DEVICE N/A 06/21/2016   Procedure: Pacemaker Implant;  Surgeon: Evans Lance, MD;  Location: [facility];  Service: Cardiovascular;  Laterality: N/A;  . ESOPHAGOGASTRODUODENOSCOPY (EGD) WITH ESOPHAGEAL DILATION  ~ 1982   Dr. Sharlett Iles  . EXCISION OF ACTINIC KERATOSIS     DR LUPTON   . EYE SURGERY    . INSERT / REPLACE / REMOVE PACEMAKER    . JOINT REPLACEMENT    . KNEE ARTHROSCOPY Left 2003  . KNEE  ARTHROSCOPY Right 06-26-13  . KNEE CLOSED REDUCTION Right 10/23/2013   Procedure: CLOSED MANIPULATION RIGHT KNEE;  Surgeon: Mcarthur Rossetti, MD;  Location: [facility];  Service: Orthopedics;  Laterality: Right;  . LASER FOR CLOUDY CAP LEFT EYE Left 03/2006   DR DIGBY  . TOTAL KNEE ARTHROPLASTY Left 04/2004   DR RENDALL  . TOTAL KNEE ARTHROPLASTY Right 07/04/2013   Procedure: RIGHT TOTAL KNEE ARTHROPLASTY;  Surgeon: Mcarthur Rossetti, MD;  Location: [facility];  Service: Orthopedics;  Laterality: Right;  . TRIGGER FINGER RELEASE Right   . VAGINAL HYSTERECTOMY  1979   Social History   Occupational History  . Not on file  Tobacco Use  . Smoking status: Never Smoker  . Smokeless tobacco: Never Used  Substance and Sexual Activity  . Alcohol use: No  . Drug use: No  . Sexual activity: Yes

## 2019-02-14 ENCOUNTER — Telehealth (INDEPENDENT_AMBULATORY_CARE_PROVIDER_SITE_OTHER): Payer: Self-pay | Admitting: Physician Assistant

## 2019-02-14 NOTE — Telephone Encounter (Signed)
Patient called stating that she has an appointment on March 5 with Dr. Sherley Bounds.  Dr. Adah Salvage office is requesting that we send any xrays of her back to them and the patient also requested the MRI results from 2016 be sent to them as well.  [phone].  Thank you.

## 2019-02-17 ENCOUNTER — Other Ambulatory Visit (INDEPENDENT_AMBULATORY_CARE_PROVIDER_SITE_OTHER): Payer: Self-pay | Admitting: Physician Assistant

## 2019-02-17 ENCOUNTER — Ambulatory Visit: Payer: Medicare Other

## 2019-02-17 MED ORDER — METHYLPREDNISOLONE 4 MG PO TABS: ORAL_TABLET | ORAL | 0 refills | Status: DC

## 2019-02-17 NOTE — Telephone Encounter (Signed)
I made a CD for this patient with the c-arm images from a procedure with Dr. Ernestina Patches, I am not seeing any plain x-rays of her back and I also do not see in her chart where she has had an MRI. should I call the patient and let her know this CD is ready for pick up or is this one that needs other paperwork and the CD should just be brought to you?

## 2019-02-17 NOTE — Telephone Encounter (Signed)
Can you help me with this please  

## 2019-02-17 NOTE — Telephone Encounter (Signed)
Please see attached message regarding xrays. Thanks!

## 2019-02-18 ENCOUNTER — Encounter (INDEPENDENT_AMBULATORY_CARE_PROVIDER_SITE_OTHER): Payer: Self-pay | Admitting: Physical Medicine and Rehabilitation

## 2019-02-18 NOTE — Procedures (Signed)
Lumbosacral Transforaminal Epidural Steroid Injection - Sub-Pedicular Approach with Fluoroscopic Guidance  Patient: [name]      Date of Birth: [birth year] MRN: [id number] PCP: Lauree Chandler, NP      Visit Date: 01/20/2019   Universal Protocol:    Date/Time: 01/20/2019  Consent Given By: the patient  Position: PRONE  Additional Comments: Vital signs were monitored before and after the procedure. Patient was prepped and draped in the usual sterile fashion. The correct patient, procedure, and site was verified.   Injection Procedure Details:  Procedure Site One Meds Administered:  Meds ordered this encounter  Medications  . betamethasone acetate-betamethasone sodium phosphate (CELESTONE) injection 12 mg    Laterality: Right  Location/Site:  L4-L5  Needle size: 22 G  Needle type: Spinal  Needle Placement: Transforaminal  Findings:    -Comments: Excellent flow of contrast along the nerve and into the epidural space.  Procedure Details: After squaring off the end-plates to get a true AP view, the C-arm was positioned so that an oblique view of the foramen as noted above was visualized. The target area is just inferior to the "nose of the scotty dog" or sub pedicular. The soft tissues overlying this structure were infiltrated with 2-3 ml. of 1% Lidocaine without Epinephrine.  The spinal needle was inserted toward the target using a "trajectory" view along the fluoroscope beam.  Under AP and lateral visualization, the needle was advanced so it did not puncture dura and was located close the 6 O'Clock position of the pedical in AP tracterory. Biplanar projections were used to confirm position. Aspiration was confirmed to be negative for CSF and/or blood. A 1-2 ml. volume of Isovue-250 was injected and flow of contrast was noted at each level. Radiographs were obtained for documentation purposes.   After attaining the desired flow of contrast documented above, a 0.5  to 1.0 ml test dose of 0.25% Marcaine was injected into each respective transforaminal space.  The patient was observed for 90 seconds post injection.  After no sensory deficits were reported, and normal lower extremity motor function was noted,   the above injectate was administered so that equal amounts of the injectate were placed at each foramen (level) into the transforaminal epidural space.   Additional Comments:  The patient tolerated the procedure well Dressing: 2 x 2 sterile gauze and Band-Aid    Post-procedure details: Patient was observed during the procedure. Post-procedure instructions were reviewed.  Patient left the clinic in stable condition.

## 2019-02-18 NOTE — Progress Notes (Signed)
[name] - 85 y.o. female MRN [id number]  Date of birth: 1931/09/07  Office Visit Note: Visit Date: 01/09/2019 PCP: Lauree Chandler, NP Referred by: Lauree Chandler, NP  Subjective: Chief Complaint  Patient presents with  . Lower Back - Follow-up, Pain   HPI: [name] is a 85 y.o. female who comes in today For reevaluation and management of low back pain and bilateral radicular type pains some question of polyneuropathy of the feet bilaterally.  By way of review we have completed epidural injection from a transforaminal approach at L4 with really good relief of both symptoms of cramping and pain and even the tingling numbness in the feet according to the patient seem to get better.  Her symptoms in the feet are nondermatomal.  The patient has a lot of somatic complaints and does spend a lot of time with questions upon questions and symptom review upon symptom review.  She is a very pleasant lady of present always with her husband who provides some details.  I wonder if there is some level of fibromyalgia involved but she has a pretty significant degenerative spine with stenosis.  She has multiple intolerances and drug allergies.  She comes in today stating that the left-sided injection has done extremely well but the right sided injection basically continues to give her some pain.  The prior bilateral injection worked on both sides and this was at L4.  The L5 injection that we completed a few weeks ago seem to help temporarily and the symptoms returned.  She reports a pain level of 4 out of 10 but it seems to be something that really limits what she can do.  She on each visit requires re-education in terms of her spine condition what we think it is the problem and what treatment options are available.  She has had no new complaints since I have seen her other than now it is both sides low back pain hip and thigh issues.  Tingling in the feet seems to be better.  No focal weakness.   Case is complicated by chronic anticoagulation, some level of generalized anxiety depression and bilateral knee replacements.  Review of Systems  Constitutional: Negative for chills, fever, malaise/fatigue and weight loss.  HENT: Negative for hearing loss and sinus pain.   Eyes: Negative for blurred vision, double vision and photophobia.  Respiratory: Negative for cough and shortness of breath.   Cardiovascular: Negative for chest pain, palpitations and leg swelling.  Gastrointestinal: Negative for abdominal pain, nausea and vomiting.  Genitourinary: Negative for flank pain.  Musculoskeletal: Positive for back pain and joint pain. Negative for myalgias.  Skin: Negative for itching and rash.  Neurological: Negative for tremors, focal weakness and weakness.  Endo/Heme/Allergies: Negative.   Psychiatric/Behavioral: Negative for depression.  All other systems reviewed and are negative.  Otherwise per HPI.  Assessment & Plan: Visit Diagnoses:  1. Lumbar radiculopathy   2. Spinal stenosis of lumbar region with neurogenic claudication   3. Leg cramping     Plan: Findings:  Chronic history of low back pain bilateral hip and leg pain status post total knee replacements and history of lumbar stenosis and spondylosis.  She did well with prior L4 injection.  Did not do as well with L5 injection on the right and the right has been more problematic than the left in terms of relief with the injection.  We have tried to explain that the injections are diagnostic in fact that they have  helped when they helped.  If these were lasting several months then obviously repeating these is a doable treatment plan.  We answered his many questions as we could today.  The next step is to repeat the L4 transforaminal injection at the level of stenosis and see how she does.  Consideration could be given to a lumbar laminectomy or at least evaluation from a spine surgeon.  She is a fairly healthy 85 year old clinically  although she does have multiple medical problems and is on anticoagulation.  I think some of the limiting factors is the anxiety and depression.    Meds & Orders: No orders of the defined types were placed in this encounter.  No orders of the defined types were placed in this encounter.   Follow-up: No follow-ups on file.   Procedures: No procedures performed  No notes on file   Clinical History: LUMBAR SPINE MRI FINDINGS:  06/03/2015   . The conus medullaris appears normal, terminating at the L1 level. No abnormal signal demonstrated within the visualized distal spinal cord. Cauda equina unremarkable.  . Vertebral body heights maintained. Marrow signal within normal limits for age.Alignment anatomic.  . The visualized lower thoracic spine demonstrates no disc herniation, significant central canal or foraminal stenosis.  . T12-L1: No disc herniation, significant central canal or foraminal stenosis. Marland Kitchen L1-L2: Small right paracentral disc extrusion seen only on sagittal images. No definite stenosis . L2-L3: No disc herniation, significant central canal or foraminal stenosis. Marland Kitchen L3-L4: Small inferiorly migrating central and right paracentral disc extrusion with potential encroachment of the right descending L4 nerve root . L4-L5: No disc herniation, significant central canal or foraminal stenosis. Marland Kitchen L5-L6: Grade 1 anterolisthesis. Severe facet arthrosis and ligament flavum thickening. Trefoil configuration of the central canal consistent with moderately severe central canal stenosis . L6-S2: No disc herniation, significant central canal or foraminal stenosis.  . Soft tissues are grossly unremarkable   IMPRESSION: 6 lumbar type vertebral bodies, lowest labeled L6 for the purposes of this report.  Grade 1 anterolisthesis of L5 on L6. Severe facet arthrosis and ligament flavum thickening. Trefoil configuration of the central canal consistent with moderately severe central canal  stenosis  Small inferiorly migrating central and right paracentral disc extrusion at L3-4 with potential encroachment of the right descending L4 nerve root  Small right paracentral disc extrusion at L1-2 seen only on sagittal images. No definite stenosis   She reports that she has never smoked. She has never used smokeless tobacco. No results for input(s): HGBA1C, LABURIC in the last 8760 hours.  Objective:  VS:  HT:    WT:187 lb (84.8 kg)  BMI:33.13    BP:122/65  HR:62bpm  TEMP: ( )  RESP:  Physical Exam Vitals signs and nursing note reviewed.  Constitutional:      General: She is not in acute distress.    Appearance: Normal appearance. She is well-developed. She is obese.  HENT:     Head: Normocephalic and atraumatic.     Nose: Nose normal.     Mouth/Throat:     Mouth: Mucous membranes are moist.     Pharynx: Oropharynx is clear.  Eyes:     Conjunctiva/sclera: Conjunctivae normal.     Pupils: Pupils are equal, round, and reactive to light.  Neck:     Musculoskeletal: Normal range of motion and neck supple.  Cardiovascular:     Rate and Rhythm: Regular rhythm.  Pulmonary:     Effort: Pulmonary effort is normal. No respiratory distress.  Abdominal:     General: There is no distension.     Palpations: Abdomen is soft.     Tenderness: There is no guarding.  Musculoskeletal:     Right lower leg: No edema.     Left lower leg: No edema.     Comments: Patient is very slow to rise from a seated position and does ambulate with a forward flexed lumbar spine.  She has a stiff lumbar spine with movement but does have pain with extension and facet loading.  No pain with hip rotation no groin pain no pain over the greater trochanters.  She does have some tenderness across the lumbar spine PSIS and in general myofascial type pain.  She has good distal strength.  Skin:    General: Skin is warm and dry.     Findings: No erythema or rash.  Neurological:     General: No focal deficit  present.     Mental Status: She is alert and oriented to person, place, and time.     Motor: No abnormal muscle tone.     Coordination: Coordination normal.     Gait: Gait normal.  Psychiatric:        Mood and Affect: Mood normal.        Behavior: Behavior normal.        Thought Content: Thought content normal.     Ortho Exam Imaging: No results found.  Past Medical/Family/Surgical/Social History: Medications & Allergies reviewed per EMR, new medications updated. Patient Active Problem List   Diagnosis Date Noted  . Osteoporosis 01/28/2019  . Trochanteric bursitis, right hip 05/16/2017  . Cardiac pacemaker in situ 06/21/2016  . Atypical atrial flutter ([place])   . Chest pain 04/10/2016  . Atrial fibrillation ([place]) 03/29/2016  . Hyperglycemia 02/17/2015  . Obese 02/17/2015  . Insomnia 01/27/2014  . 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 Knee joint replacement by other means 10/01/2013  . Arthritis of knee, right 07/04/2013  . Osteoarthritis of right knee 07/01/2013  . Cervicalgia 07/01/2013  . Bradycardia 05/13/2013  . HYPERCHOLESTEROLEMIA 06/02/2009  . ANXIETY 06/02/2009  . Essential hypertension 06/02/2009  . ESOPHAGEAL STRICTURE 06/02/2009  . GERD 06/02/2009  . HIATAL HERNIA 06/02/2009  . DIVERTICULOSIS, COLON 06/02/2009  . Dysphagia, pharyngoesophageal phase 06/02/2009  . Backache 05/01/2007  . LEG PAIN, RIGHT 05/01/2007   Past Medical History:  Diagnosis Date  . Allergic rhinitis due to pollen   . Anxiety state, unspecified   . Arthritis    "right knee" (06/21/2016)  . Asymptomatic varicose veins   . Carpal tunnel syndrome   . Chest pain, unspecified   . Chronic lower back pain    "on the left side" (06/21/2016)  . Cramp of limb   . Depressive disorder, not elsewhere classified   . Diaphragmatic hernia without mention of obstruction or gangrene   . Diaphragmatic hernia without mention of obstruction or gangrene   . Disturbance of skin sensation   .  Diverticulosis of colon (without mention of hemorrhage)   . Enthesopathy of hip region   . Esophageal reflux   . Hepatitis, unspecified   . History of blood transfusion    "w/both knee replacements"  . History of duodenal ulcer   . Insomnia, unspecified   . Lumbago   . Migraine    "none since ~ 1990" (06/21/2016)  . Myalgia and myositis, unspecified   . Obesity, unspecified   . Other abnormal blood chemistry   . Other dysphagia   . Other  malaise and fatigue   . Other nonspecific abnormal serum enzyme levels   . Other specified cardiac dysrhythmias(427.89)   . Pacemaker   . Pain in joint, ankle and foot   . Pain in joint, hand   . Pain in joint, lower leg   . Pain in joint, pelvic region and thigh   . Pain in limb   . Plantar fascial fibromatosis   . Presence of permanent cardiac pacemaker   . Reflux esophagitis   . Sciatica   . Spinal stenosis, unspecified region other than cervical   . Stricture and stenosis of esophagus   . Symptomatic menopausal or female climacteric states   . Unspecified essential hypertension   . Unspecified essential hypertension   . Unspecified vitamin D deficiency    Family History  Problem Relation Age of Onset  . Ovarian cancer Mother   . Uterine cancer Mother   . Cerebrovascular Accident Mother   . Heart disease Father   . Hypertension Brother   . Obesity Daughter    Past Surgical History:  Procedure Laterality Date  . BREAST BIOPSY Left 1990s X 2  . CARDIOVERSION N/A 04/26/2016   Procedure: CARDIOVERSION;  Surgeon: Pixie Casino, MD;  Location: [facility];  Service: Cardiovascular;  Laterality: N/A;  . CATARACT EXTRACTION W/ INTRAOCULAR LENS IMPLANT Left 08/03/1999   DR EPES   . CATARACT EXTRACTION W/ INTRAOCULAR LENS IMPLANT Right 2000   DR EPES  . [place]  . COLONOSCOPY  1988   Normal   . DENTAL SURGERY Left 08/2016  . EP IMPLANTABLE DEVICE N/A 06/21/2016   Procedure: Pacemaker Implant;  Surgeon:  Evans Lance, MD;  Location: [facility];  Service: Cardiovascular;  Laterality: N/A;  . ESOPHAGOGASTRODUODENOSCOPY (EGD) WITH ESOPHAGEAL DILATION  ~ 1982   Dr. Sharlett Iles  . EXCISION OF ACTINIC KERATOSIS     DR LUPTON   . EYE SURGERY    . INSERT / REPLACE / REMOVE PACEMAKER    . JOINT REPLACEMENT    . KNEE ARTHROSCOPY Left 2003  . KNEE ARTHROSCOPY Right 06-26-13  . KNEE CLOSED REDUCTION Right 10/23/2013   Procedure: CLOSED MANIPULATION RIGHT KNEE;  Surgeon: Mcarthur Rossetti, MD;  Location: [facility];  Service: Orthopedics;  Laterality: Right;  . LASER FOR CLOUDY CAP LEFT EYE Left 03/2006   DR DIGBY  . TOTAL KNEE ARTHROPLASTY Left 04/2004   DR RENDALL  . TOTAL KNEE ARTHROPLASTY Right 07/04/2013   Procedure: RIGHT TOTAL KNEE ARTHROPLASTY;  Surgeon: Mcarthur Rossetti, MD;  Location: [facility];  Service: Orthopedics;  Laterality: Right;  . TRIGGER FINGER RELEASE Right   . VAGINAL HYSTERECTOMY  1979   Social History   Occupational History  . Not on file  Tobacco Use  . Smoking status: Never Smoker  . Smokeless tobacco: Never Used  Substance and Sexual Activity  . Alcohol use: No  . Drug use: No  . Sexual activity: Yes

## 2019-02-18 NOTE — Progress Notes (Signed)
[name] - 85 y.o. female MRN [id number]  Date of birth: June 20, 1931  Office Visit Note: Visit Date: 01/20/2019 PCP: Lauree Chandler, NP Referred by: Lauree Chandler, NP  Subjective: Chief Complaint  Patient presents with  . Right Thigh - Pain  . Right Knee - Pain   HPI:  [name] is a 85 y.o. female who comes in today For planned right L4 transforaminal epidural steroid injection.  Please see our prior evaluation and management note for further details and justification.  She is also complaining of right knee pain which we are unsure if this is related to her lumbar spine or knee issues.  She does have total knee arthroplasty.  I will actually ask her to follow-up with Dr. Jean Rosenthal or his physician assistant Benita Stabile.  ROS Otherwise per HPI.  Assessment & Plan: Visit Diagnoses:  1. Lumbar radiculopathy   2. 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   Meds & Orders:  Meds ordered this encounter  Medications  . betamethasone acetate-betamethasone sodium phosphate (CELESTONE) injection 12 mg    Orders Placed This Encounter  Procedures  . XR C-ARM NO REPORT  . Epidural Steroid injection    Follow-up: Return if symptoms worsen or fail to improve.   Procedures: No procedures performed  Lumbosacral Transforaminal Epidural Steroid Injection - Sub-Pedicular Approach with Fluoroscopic Guidance  Patient: [name]      Date of Birth: 11-24-1931 MRN: [id number] PCP: Lauree Chandler, NP      Visit Date: 01/20/2019   Universal Protocol:    Date/Time: 01/20/2019  Consent Given By: the patient  Position: PRONE  Additional Comments: Vital signs were monitored before and after the procedure. Patient was prepped and draped in the usual sterile fashion. The correct patient, procedure, and site was verified.   Injection Procedure Details:  Procedure Site One Meds Administered:  Meds  ordered this encounter  Medications  . betamethasone acetate-betamethasone sodium phosphate (CELESTONE) injection 12 mg    Laterality: Right  Location/Site:  L4-L5  Needle size: 22 G  Needle type: Spinal  Needle Placement: Transforaminal  Findings:    -Comments: Excellent flow of contrast along the nerve and into the epidural space.  Procedure Details: After squaring off the end-plates to get a true AP view, the C-arm was positioned so that an oblique view of the foramen as noted above was visualized. The target area is just inferior to the "nose of the scotty dog" or sub pedicular. The soft tissues overlying this structure were infiltrated with 2-3 ml. of 1% Lidocaine without Epinephrine.  The spinal needle was inserted toward the target using a "trajectory" view along the fluoroscope beam.  Under AP and lateral visualization, the needle was advanced so it did not puncture dura and was located close the 6 O'Clock position of the pedical in AP tracterory. Biplanar projections were used to confirm position. Aspiration was confirmed to be negative for CSF and/or blood. A 1-2 ml. volume of Isovue-250 was injected and flow of contrast was noted at each level. Radiographs were obtained for documentation purposes.   After attaining the desired flow of contrast documented above, a 0.5 to 1.0 ml test dose of 0.25% Marcaine was injected into each respective transforaminal space.  The patient was observed for 90 seconds post injection.  After no sensory deficits were reported, and normal lower extremity motor function was noted,   the above injectate  was administered so that equal amounts of the injectate were placed at each foramen (level) into the transforaminal epidural space.   Additional Comments:  The patient tolerated the procedure well Dressing: 2 x 2 sterile gauze and Band-Aid    Post-procedure details: Patient was observed during the procedure. Post-procedure instructions were  reviewed.  Patient left the clinic in stable condition.    Clinical History: LUMBAR SPINE MRI FINDINGS:  06/03/2015   . The conus medullaris appears normal, terminating at the L1 level. No abnormal signal demonstrated within the visualized distal spinal cord. Cauda equina unremarkable.  . Vertebral body heights maintained. Marrow signal within normal limits for age.Alignment anatomic.  . The visualized lower thoracic spine demonstrates no disc herniation, significant central canal or foraminal stenosis.  . T12-L1: No disc herniation, significant central canal or foraminal stenosis. Marland Kitchen L1-L2: Small right paracentral disc extrusion seen only on sagittal images. No definite stenosis . L2-L3: No disc herniation, significant central canal or foraminal stenosis. Marland Kitchen L3-L4: Small inferiorly migrating central and right paracentral disc extrusion with potential encroachment of the right descending L4 nerve root . L4-L5: No disc herniation, significant central canal or foraminal stenosis. Marland Kitchen L5-L6: Grade 1 anterolisthesis. Severe facet arthrosis and ligament flavum thickening. Trefoil configuration of the central canal consistent with moderately severe central canal stenosis . L6-S2: No disc herniation, significant central canal or foraminal stenosis.  . Soft tissues are grossly unremarkable   IMPRESSION: 6 lumbar type vertebral bodies, lowest labeled L6 for the purposes of this report.  Grade 1 anterolisthesis of L5 on L6. Severe facet arthrosis and ligament flavum thickening. Trefoil configuration of the central canal consistent with moderately severe central canal stenosis  Small inferiorly migrating central and right paracentral disc extrusion at L3-4 with potential encroachment of the right descending L4 nerve root  Small right paracentral disc extrusion at L1-2 seen only on sagittal images. No definite stenosis     Objective:  VS:  HT:    WT:   BMI:     BP:127/70  HR:62bpm  TEMP:97.9  F (36.6 C)(Oral)  RESP:  Physical Exam  Ortho Exam Imaging: No results found.

## 2019-02-27 DIAGNOSIS — M545 Low back pain: Secondary | ICD-10-CM | POA: Diagnosis not present

## 2019-02-28 ENCOUNTER — Telehealth: Payer: Self-pay

## 2019-02-28 NOTE — Telephone Encounter (Signed)
Patient with diagnosis of Afib on Eliquis for anticoagulation.    Procedure: CT myelogram Date of procedure: TBD  CHADS2-VASc score of  4 (CHF, HTN, AGE, DM2, stroke/tia x 2, CAD, AGE, female)  CrCl 84ml/min  Per office protocol, patient can hold Eliquis for 3 days prior to procedure.

## 2019-02-28 NOTE — Telephone Encounter (Signed)
   Primary Cardiologist: Peter Martinique, MD  Chart reviewed as part of pre-operative protocol coverage. Given past medical history and time since last visit, based on ACC/AHA guidelines, [name] would be at acceptable risk for the planned procedure without further cardiovascular testing.   Per office protocol, patient can hold Eliquis for 3 days prior to procedure.    I will route this recommendation to the requesting party via Epic fax function and remove from pre-op pool.  Please call with questions.  Lyda Jester, PA-C 02/28/2019, 3:34 PM

## 2019-02-28 NOTE — Telephone Encounter (Signed)
Spoke with pt who states that Dr. Sherley Bounds ( Neurosurgeon) is the one requesting clearance for the pt. The phone # is [phone], Fax # [phone]

## 2019-02-28 NOTE — Telephone Encounter (Signed)
Pt called saying she will myleogram and a Ct scan. The doctor wants her to stop her Eliquis for 3 days before her procedure. She wants to know will that be okay for her to stop taking the Eliquis for 3 days? She also wants to know if it will be okay for her to have the myleogram in her spine. The pt would like for Dr. Lovena Le nurse to give her a call back at [phone]

## 2019-02-28 NOTE — Telephone Encounter (Signed)
Will route to pharmacist for review.

## 2019-03-03 ENCOUNTER — Other Ambulatory Visit: Payer: Self-pay | Admitting: Neurological Surgery

## 2019-03-03 ENCOUNTER — Telehealth: Payer: Self-pay | Admitting: Nurse Practitioner

## 2019-03-03 DIAGNOSIS — M545 Low back pain: Principal | ICD-10-CM

## 2019-03-03 DIAGNOSIS — G8929 Other chronic pain: Secondary | ICD-10-CM

## 2019-03-03 NOTE — Telephone Encounter (Signed)
Phone call to patient to verify medication list and allergies for myelogram procedure. Pt instructed to hold Trazodone for 48hrs prior to myelogram appointment time. Pt also aware she will need to hold Eliquis for 48hrs (pending approval and further recommendation from [name], [place]). Pt verbalized understanding. Thinner hold request faxed to cardiology office. Awaiting response.

## 2019-03-12 ENCOUNTER — Ambulatory Visit (INDEPENDENT_AMBULATORY_CARE_PROVIDER_SITE_OTHER): Payer: Medicare Other | Admitting: Physician Assistant

## 2019-03-13 ENCOUNTER — Telehealth: Payer: Self-pay

## 2019-03-13 NOTE — Telephone Encounter (Signed)
COVID-19 Pre-Screening Questions:  Provider: Martin[place]   Needs f/u  SHE WILL KEEP APPT  . Have you been in contact with someone that was recently sick with fever/cough or confirmed to have the [place] virus?  NO  *Contact with a confirmed case should stay at home, away from confirmed patient, monitor symptoms, and reach out to PCP for e-visit/additional testing.  2. Do you have any of the following symptoms [cough, fever (100.4 or greater)], and/or shortness of breath)?  NO  *ALL PTS W/ FEVER SHOULD BE REFERRED TO PCP FOR E-VISIT* _________________________________________________  Cardiac Questionnaire:    Since your last visit or hospitalization:    1. Have you been having chest pain? YES-FOR A COUPLE DAYS PT HAS HAD RADIATING CP INTO LEFT SHOULDER AND JAW SHE WILL GO TO THE ER IF THIS CONTINUES. PT HAS NOT HAD WITHIN THE LAST 24 HOURS BUT WOULD LIKE TO BE EVAL. SHE WILL GO TO THE ER IF THIS CONTINUES/WORSENS   2. Have you been having shortness of breath? NO   3. Have you been having increasing edema, wt gain, or increase in abdominal girth (pants fitting more tightly)? -----   4. Have you had any passing out spells? -----    *A YES to any of these questions would result in the appointment being kept.  *If all the answers to these questions are NO, we should indicate that given the current situation regarding the worldwide coronarvirus pandemic, at the recommendation of the CDC, we are looking to limit gatherings in our waiting area, and thus will reschedule their appointment beyond four weeks from today.

## 2019-03-13 NOTE — Telephone Encounter (Signed)
PT's surgery is postponed until 03-27-2019 w/dr Sherley Bounds at [facility] [phone]  fx [phone]

## 2019-03-17 ENCOUNTER — Encounter: Payer: Self-pay | Admitting: Cardiology

## 2019-03-17 ENCOUNTER — Ambulatory Visit: Payer: Medicare Other | Admitting: Cardiology

## 2019-03-17 ENCOUNTER — Other Ambulatory Visit: Payer: Medicare Other

## 2019-03-17 VITALS — BP 122/70 | HR 63 | Ht 63.0 in | Wt 187.0 lb

## 2019-03-17 DIAGNOSIS — I10 Essential (primary) hypertension: Secondary | ICD-10-CM

## 2019-03-17 DIAGNOSIS — R079 Chest pain, unspecified: Secondary | ICD-10-CM

## 2019-03-17 DIAGNOSIS — I480 Paroxysmal atrial fibrillation: Secondary | ICD-10-CM

## 2019-03-17 DIAGNOSIS — I495 Sick sinus syndrome: Secondary | ICD-10-CM | POA: Diagnosis not present

## 2019-03-17 DIAGNOSIS — I484 Atypical atrial flutter: Secondary | ICD-10-CM

## 2019-03-17 DIAGNOSIS — I4819 Other persistent atrial fibrillation: Secondary | ICD-10-CM

## 2019-03-17 MED ORDER — APIXABAN 5 MG PO TABS: 5.0000 mg | ORAL_TABLET | Freq: Two times a day (BID) | ORAL | 3 refills | Status: DC

## 2019-03-17 NOTE — Progress Notes (Signed)
Cardiology Office Note   Date:  03/17/2019   ID:  Hartlee, Amedee 17-Jan-1931, MRN [id number]  PCP:  Lauree Chandler, NP  Cardiologist:   Maxx Pham Martin[place], MD   Chief Complaint  Patient presents with  . Chest Pain      History of Present Illness: [name] is a 85 y.o. female is seen for evaluation of chest pain. She has a long history of marked sinus bradycardia and atrial fibrillation/flutter. She was seen initially in 2014. Echo at that time showed mild LAE otherwise normal. Holter showed mean HR 59 with lowest HR 43 and peak HR 109.    On March 9,2017 she noted an increased HR by BP monitor up to 153. At this time she felt marked indigestion from her waist to her neck. She felt her heart fluttering.   She was seen by Dr. Nyoka Cowden on 03/29/16 and found to be in Afib with RVR. She was started on Eliquis and metoprolol. Myoview study and Echo were normal.  She later had attempt at DCCV. She was in an atypical atrial flutter at that time. DCCV resulted in very prolonged pauses > 6 seconds and return to flutter. She was seen by Dr. Curt Bears and placed on flecainide. This did convert her to NSR but made her feel very sick with nausea, dizziness, and extreme fatigue. Flecainide was discontinued.. She continued to have marked bradycardia and underwent PPM placement on 06/21/16. When seen in September 2018 by Dr. Lovena Le she had an Afib burden of 94%. She was started on propafenone for her Afib. Initially this medication caused her to have more nausea but this has improved.  On last pacer check in December 2019 Afib burden was low. Rate was controlled.    She has been having a lot of back pain and sciatica. She was scheduled for a myelogram today by Dr Sherley Bounds but this has been cancelled due to  Covid 19.  She also has pain in her left shoulder and upper left chest and jaw with numbness. This is worse at night sitting in her recliner. No relation to activity. No dyspnea. No palpitations.    Past Medical History:  Diagnosis Date  . Allergic rhinitis due to pollen   . Anxiety state, unspecified   . Arthritis    "right knee" (06/21/2016)  . Asymptomatic varicose veins   . Carpal tunnel syndrome   . Chest pain, unspecified   . Chronic lower back pain    "on the left side" (06/21/2016)  . Cramp of limb   . Depressive disorder, not elsewhere classified   . Diaphragmatic hernia without mention of obstruction or gangrene   . Diaphragmatic hernia without mention of obstruction or gangrene   . Disturbance of skin sensation   . Diverticulosis of colon (without mention of hemorrhage)   . Enthesopathy of hip region   . Esophageal reflux   . Hepatitis, unspecified   . History of blood transfusion    "w/both knee replacements"  . History of duodenal ulcer   . Insomnia, unspecified   . Lumbago   . Migraine    "none since ~ 1990" (06/21/2016)  . Myalgia and myositis, unspecified   . Obesity, unspecified   . Other abnormal blood chemistry   . Other dysphagia   . Other malaise and fatigue   . Other nonspecific abnormal serum enzyme levels   . Other specified cardiac dysrhythmias(427.89)   . Pacemaker   . Pain in joint, ankle  and foot   . Pain in joint, hand   . Pain in joint, lower leg   . Pain in joint, pelvic region and thigh   . Pain in limb   . Plantar fascial fibromatosis   . Presence of permanent cardiac pacemaker   . Reflux esophagitis   . Sciatica   . Spinal stenosis, unspecified region other than cervical   . Stricture and stenosis of esophagus   . Symptomatic menopausal or female climacteric states   . Unspecified essential hypertension   . Unspecified essential hypertension   . Unspecified vitamin D deficiency     Past Surgical History:  Procedure Laterality Date  . BREAST BIOPSY Left 1990s X 2  . CARDIOVERSION N/A 04/26/2016   Procedure: CARDIOVERSION;  Surgeon: Pixie Casino, MD;  Location: [facility];  Service: Cardiovascular;  Laterality: N/A;  .  CATARACT EXTRACTION W/ INTRAOCULAR LENS IMPLANT Left 08/03/1999   DR EPES   . CATARACT EXTRACTION W/ INTRAOCULAR LENS IMPLANT Right 2000   DR EPES  . [place]  . COLONOSCOPY  1988   Normal   . DENTAL SURGERY Left 08/2016  . EP IMPLANTABLE DEVICE N/A 06/21/2016   Procedure: Pacemaker Implant;  Surgeon: Evans Lance, MD;  Location: [facility];  Service: Cardiovascular;  Laterality: N/A;  . ESOPHAGOGASTRODUODENOSCOPY (EGD) WITH ESOPHAGEAL DILATION  ~ 1982   Dr. Sharlett Iles  . EXCISION OF ACTINIC KERATOSIS     DR LUPTON   . EYE SURGERY    . INSERT / REPLACE / REMOVE PACEMAKER    . JOINT REPLACEMENT    . KNEE ARTHROSCOPY Left 2003  . KNEE ARTHROSCOPY Right 06-26-13  . KNEE CLOSED REDUCTION Right 10/23/2013   Procedure: CLOSED MANIPULATION RIGHT KNEE;  Surgeon: Mcarthur Rossetti, MD;  Location: [facility];  Service: Orthopedics;  Laterality: Right;  . LASER FOR CLOUDY CAP LEFT EYE Left 03/2006   DR DIGBY  . TOTAL KNEE ARTHROPLASTY Left 04/2004   DR RENDALL  . TOTAL KNEE ARTHROPLASTY Right 07/04/2013   Procedure: RIGHT TOTAL KNEE ARTHROPLASTY;  Surgeon: Mcarthur Rossetti, MD;  Location: [facility];  Service: Orthopedics;  Laterality: Right;  . TRIGGER FINGER RELEASE Right   . VAGINAL HYSTERECTOMY  1979     Current Outpatient Medications  Medication Sig Dispense Refill  . acetaminophen (TYLENOL) 500 MG tablet Take 500 mg by mouth every 6 (six) hours as needed for mild pain.     Marland Kitchen alendronate (FOSAMAX) 70 MG tablet Take 70 mg by mouth once a week. Take with a full glass of water on an empty stomach.    . ALPRAZolam (XANAX) 0.25 MG tablet Take one tablet by mouth at bedtime as needed for rest or anxiety 30 tablet 0  . antiseptic oral rinse (BIOTENE) LIQD 15 mLs by Mouth Rinse route daily as needed for dry mouth.    Marland Kitchen apixaban (ELIQUIS) 5 MG TABS tablet Take 1 tablet (5 mg total) by mouth 2 (two) times daily. 180 tablet 3  . bimatoprost (LUMIGAN) 0.01 %  SOLN Instill 1 drop into both eyes once daily in the evening    . BIOTIN 5000 PO Take 1 capsule by mouth daily.    . Calcium Carb-Cholecalciferol (CALTRATE 600+D3) 600-800 MG-UNIT TABS 1 tablet twice daily 60 tablet 0  . Cholecalciferol (VITAMIN D) 2000 UNITS tablet Take 2,000 Units by mouth daily.    . dorzolamide (TRUSOPT) 2 % ophthalmic solution Place 1 drop into both  eyes 3 (three) times daily.     Marland Kitchen esomeprazole (NEXIUM) 20 MG capsule TAKE 1 CAPSULE BY MOUTH EVERY DAY 30 capsule 2  . ezetimibe (ZETIA) 10 MG tablet TAKE 1 TABLET ONCE DAILY. 90 tablet 1  . fluticasone (FLONASE) 50 MCG/ACT nasal spray Place 1 spray into both nostrils daily. 16 g 6  . hydrochlorothiazide (HYDRODIURIL) 25 MG tablet Take 1 tablet (25 mg total) by mouth daily. 90 tablet 0  . Ketoprofen 10 % CREA Apply 1 application topically daily as needed (pain). 20% cream; PRN for pain. 30 mL 2  . methylcellulose (ARTIFICIAL TEARS) 1 % ophthalmic solution Place 1 drop into both eyes daily as needed (for dry eyes).     . methylPREDNISolone (MEDROL) 4 MG tablet Take as directed 21 tablet 0  . metoprolol tartrate (LOPRESSOR) 50 MG tablet TAKE 1 TABLET BY MOUTH TWICE A DAY 180 tablet 2  . propafenone (RYTHMOL SR) 225 MG 12 hr capsule TAKE 1 CAPSULE (225 MG TOTAL) BY MOUTH 2 (TWO) TIMES DAILY. 60 capsule 8  . traZODone (DESYREL) 50 MG tablet TAKE 1 TABLET BY MOUTH EVERY NIGHT TO HELP WITH SLEEP 30 tablet 5  . valsartan (DIOVAN) 320 MG tablet Take 1 tablet (320 mg total) by mouth daily. 90 tablet 0   No current facility-administered medications for this visit.     Allergies:   Advil [ibuprofen]; Aleve [naproxen sodium]; Aspirin; Atorvastatin; Celebrex [celecoxib]; Diclofenac; Doxycycline; Fosamax [alendronate sodium]; Metoclopramide hcl; Nsaids; Other; Pravastatin; and Timolol    Social History:  The patient  reports that she has never smoked. She has never used smokeless tobacco. She reports that she does not drink alcohol or use  drugs.   Family History:  The patient's family history includes Cerebrovascular Accident in her mother; Heart disease in her father; Hypertension in her brother; Obesity in her daughter; Ovarian cancer in her mother; Uterine cancer in her mother.    ROS:  Please see the history of present illness.   Otherwise, review of systems are positive for none.   All other systems are reviewed and negative.    PHYSICAL EXAM: VS:  BP 122/70   Pulse 63   Ht 5\' 3"  (1.6 m)   Wt 187 lb (84.8 kg)   BMI 33.13 kg/m  , BMI Body mass index is 33.13 kg/m. GENERAL:  Well appearing, obese WF in NAD HEENT:  PERRL, EOMI, sclera are clear. Oropharynx is clear. NECK:  No jugular venous distention, carotid upstroke brisk and symmetric, no bruits, no thyromegaly or adenopathy LUNGS:  Clear to auscultation bilaterally CHEST:  Unremarkable HEART:  RRR,  PMI not displaced or sustained,S1 and S2 within normal limits, no S3, no S4: no clicks, no rubs, no murmurs ABD:  Soft, nontender. BS +, no masses or bruits. No hepatomegaly, no splenomegaly EXT:  2 + pulses throughout, no edema, no cyanosis no clubbing SKIN:  Warm and dry.  No rashes NEURO:  Alert and oriented x 3. Cranial nerves II through XII intact. PSYCH:  Cognitively intact    EKG:  EKG is  ordered today. Atrial paced at 63 bpm. Nonspecific ST-T wave abnormality. I have personally reviewed and interpreted this study.  Recent Labs: 05/07/2018: ALT 23 10/02/2018: BUN 14; Creat 0.83; Hemoglobin 12.4; Platelets 171; Potassium 3.8; Sodium 135    Lipid Panel    Component Value Date/Time   CHOL 151 05/07/2018 0925   CHOL 179 03/27/2016 0853   TRIG 160 (H) 05/07/2018 0925   HDL 39 (L)  05/07/2018 0925   HDL 42 03/27/2016 0853   CHOLHDL 3.9 05/07/2018 0925   VLDL 42 (H) 08/06/2017 0918   LDLCALC 85 05/07/2018 0925      Wt Readings from Last 3 Encounters:  03/17/19 187 lb (84.8 kg)  01/28/19 186 lb (84.4 kg)  01/13/19 184 lb 3.2 oz (83.6 kg)       Other studies Reviewed: Additional studies/ records that were reviewed today include:  Echo: 09/06/17: Study Conclusions  - Left ventricle: The cavity size was normal. Wall thickness was   increased in a pattern of mild LVH. Systolic function was normal.   The estimated ejection fraction was in the range of 55% to 60%.   Doppler parameters are consistent with both elevated ventricular   end-diastolic filling pressure and elevated left atrial filling   pressure. - Left atrium: The atrium was moderately dilated. - Atrial septum: There was increased thickness of the septum,   consistent with lipomatous hypertrophy. No defect or patent   foramen ovale was identified  ASSESSMENT AND PLAN:  1.  Atypical left shoulder and chest pain. I suspect this is also more related to cervical spine disease. Prior ETT 2 years ago without ischemia. Ecg today is unchanged. Reassured. No further evaluation at this time.   2. Atrial fibrillation/flutter with RVR. Unsuccessful DCCV with marked conversion pauses. She did convert on flecainide but unable to tolerate this medication. Now s/p PPM placement. Started on propafenone with marked improvement in Afib burden. Will continue same. Continue Eliquis for anticoagulation. Eliquis may be held for 3 days for Myelogram or surgical procedures if it comes to that.  3. HTN well controlled.  4. Mild hypercholesterolemia.   Disposition: follow up with me in 6 months.   Signed, [name], [name]  03/17/2019 8:35 AM    [facility] [address], [place], [place], [address] Phone [phone], Fax [phone]

## 2019-03-18 ENCOUNTER — Other Ambulatory Visit: Payer: Self-pay | Admitting: Nurse Practitioner

## 2019-03-18 NOTE — Telephone Encounter (Signed)
Received high medication alert on hydrochlorothiazide

## 2019-03-20 ENCOUNTER — Other Ambulatory Visit: Payer: Self-pay | Admitting: Nurse Practitioner

## 2019-03-20 MED ORDER — NYSTATIN 100000 UNIT/GM EX CREA: 1.0000 "application " | TOPICAL_CREAM | Freq: Two times a day (BID) | CUTANEOUS | 0 refills | Status: DC

## 2019-03-20 MED ORDER — NYSTATIN 100000 UNIT/GM EX POWD: Freq: Three times a day (TID) | CUTANEOUS | 0 refills | Status: DC

## 2019-03-20 NOTE — Progress Notes (Signed)
Pt with beefy red rash under left breast causing increase in pain. Had under right breast ~6 weeks but not as bad.   -Rx sent to pharmacy for nystatin cream to use to effected area, once area improves can transition to powder

## 2019-03-24 ENCOUNTER — Other Ambulatory Visit: Payer: Self-pay

## 2019-03-24 ENCOUNTER — Ambulatory Visit (INDEPENDENT_AMBULATORY_CARE_PROVIDER_SITE_OTHER): Payer: Medicare Other | Admitting: *Deleted

## 2019-03-24 DIAGNOSIS — I495 Sick sinus syndrome: Secondary | ICD-10-CM

## 2019-03-24 DIAGNOSIS — R001 Bradycardia, unspecified: Secondary | ICD-10-CM

## 2019-03-24 LAB — CUP PACEART REMOTE DEVICE CHECK
Battery Remaining Longevity: 123 mo
Battery Remaining Percentage: 95.5 %
Battery Voltage: 2.99 V
Brady Statistic AP VP Percent: 1 %
Brady Statistic AP VS Percent: 98 %
Brady Statistic AS VP Percent: 1 %
Brady Statistic AS VS Percent: 2.3 %
Brady Statistic RA Percent Paced: 97 %
Brady Statistic RV Percent Paced: 1 %
Date Time Interrogation Session: 20200330162128
Implantable Lead Implant Date: 20170628
Implantable Lead Implant Date: 20170628
Implantable Lead Location: 753859
Implantable Lead Location: 753860
Lead Channel Impedance Value: 530 Ohm
Lead Channel Impedance Value: 530 Ohm
Lead Channel Pacing Threshold Amplitude: 1 V
Lead Channel Pacing Threshold Pulse Width: 0.4 ms
Lead Channel Pacing Threshold Pulse Width: 0.4 ms
Lead Channel Sensing Intrinsic Amplitude: 6.6 mV
Lead Channel Setting Pacing Amplitude: 2 V
Lead Channel Setting Pacing Amplitude: 2.5 V
Lead Channel Setting Pacing Pulse Width: 0.4 ms
Lead Channel Setting Sensing Sensitivity: 2 mV
MDC IDC MSMT LEADCHNL RA PACING THRESHOLD AMPLITUDE: 0.5 V
MDC IDC MSMT LEADCHNL RA SENSING INTR AMPL: 2.5 mV
MDC IDC PG IMPLANT DT: 20170628
Pulse Gen Model: 2272
Pulse Gen Serial Number: 7910313

## 2019-03-27 ENCOUNTER — Other Ambulatory Visit: Payer: Self-pay

## 2019-03-27 ENCOUNTER — Other Ambulatory Visit: Payer: Medicare Other

## 2019-03-27 DIAGNOSIS — I4819 Other persistent atrial fibrillation: Secondary | ICD-10-CM | POA: Diagnosis not present

## 2019-03-27 DIAGNOSIS — E6609 Other obesity due to excess calories: Secondary | ICD-10-CM

## 2019-03-27 DIAGNOSIS — Z6832 Body mass index (BMI) 32.0-32.9, adult: Secondary | ICD-10-CM

## 2019-03-27 LAB — COMPLETE METABOLIC PANEL WITH GFR
AG Ratio: 1.6 (calc) (ref 1.0–2.5)
ALT: 15 U/L (ref 6–29)
AST: 17 U/L (ref 10–35)
Albumin: 3.9 g/dL (ref 3.6–5.1)
Alkaline phosphatase (APISO): 37 U/L (ref 37–153)
BUN: 15 mg/dL (ref 7–25)
CO2: 30 mmol/L (ref 20–32)
Calcium: 9.2 mg/dL (ref 8.6–10.4)
Chloride: 102 mmol/L (ref 98–110)
Creat: 0.73 mg/dL (ref 0.60–0.88)
GFR, Est African American: 87 mL/min/{1.73_m2} (ref >=60)
GFR, Est Non African American: 75 mL/min/{1.73_m2} (ref >=60)
Globulin: 2.4 g/dL (calc) (ref 1.9–3.7)
Glucose, Bld: 97 mg/dL (ref 65–99)
Potassium: 3.8 mmol/L (ref 3.5–5.3)
Sodium: 139 mmol/L (ref 135–146)
Total Bilirubin: 0.4 mg/dL (ref 0.2–1.2)
Total Protein: 6.3 g/dL (ref 6.1–8.1)

## 2019-03-27 LAB — CBC WITH DIFFERENTIAL/PLATELET
Absolute Monocytes: 472 cells/uL (ref 200–950)
Basophils Absolute: 42 cells/uL (ref 0–200)
Basophils Relative: 0.8 %
Eosinophils Absolute: 127 cells/uL (ref 15–500)
Eosinophils Relative: 2.4 %
HCT: 36.4 % (ref 35.0–45.0)
Hemoglobin: 12.3 g/dL (ref 11.7–15.5)
Lymphs Abs: 2184 cells/uL (ref 850–3900)
MCH: 31.4 pg (ref 27.0–33.0)
MCHC: 33.8 g/dL (ref 32.0–36.0)
MCV: 92.9 fL (ref 80.0–100.0)
MPV: 10.2 fL (ref 7.5–12.5)
Monocytes Relative: 8.9 %
Neutro Abs: 2475 cells/uL (ref 1500–7800)
Neutrophils Relative %: 46.7 %
Platelets: 188 10*3/uL (ref 140–400)
RBC: 3.92 10*6/uL (ref 3.80–5.10)
RDW: 12.7 % (ref 11.0–15.0)
Total Lymphocyte: 41.2 %
WBC: 5.3 10*3/uL (ref 3.8–10.8)

## 2019-03-27 LAB — LIPID PANEL
Cholesterol: 162 mg/dL (ref <200)
HDL: 37 mg/dL — ABNORMAL LOW (ref >=50)
LDL Cholesterol (Calc): 93 mg/dL (calc)
Non-HDL Cholesterol (Calc): 125 mg/dL (calc) (ref <130)
Total CHOL/HDL Ratio: 4.4 (calc) (ref <5.0)
Triglycerides: 233 mg/dL — ABNORMAL HIGH (ref <150)

## 2019-03-31 DIAGNOSIS — L84 Corns and callosities: Secondary | ICD-10-CM | POA: Diagnosis not present

## 2019-03-31 DIAGNOSIS — I739 Peripheral vascular disease, unspecified: Secondary | ICD-10-CM | POA: Diagnosis not present

## 2019-03-31 DIAGNOSIS — L602 Onychogryphosis: Secondary | ICD-10-CM | POA: Diagnosis not present

## 2019-03-31 NOTE — Progress Notes (Signed)
Remote pacemaker transmission.   

## 2019-04-02 ENCOUNTER — Other Ambulatory Visit: Payer: Self-pay

## 2019-04-02 ENCOUNTER — Ambulatory Visit (INDEPENDENT_AMBULATORY_CARE_PROVIDER_SITE_OTHER): Payer: Medicare Other | Admitting: Nurse Practitioner

## 2019-04-02 ENCOUNTER — Encounter: Payer: Self-pay | Admitting: Nurse Practitioner

## 2019-04-02 DIAGNOSIS — K5900 Constipation, unspecified: Secondary | ICD-10-CM

## 2019-04-02 DIAGNOSIS — G4700 Insomnia, unspecified: Secondary | ICD-10-CM

## 2019-04-02 DIAGNOSIS — E7800 Pure hypercholesterolemia, unspecified: Secondary | ICD-10-CM

## 2019-04-02 DIAGNOSIS — F411 Generalized anxiety disorder: Secondary | ICD-10-CM

## 2019-04-02 DIAGNOSIS — M810 Age-related osteoporosis without current pathological fracture: Secondary | ICD-10-CM | POA: Diagnosis not present

## 2019-04-02 DIAGNOSIS — I480 Paroxysmal atrial fibrillation: Secondary | ICD-10-CM

## 2019-04-02 DIAGNOSIS — M48062 Spinal stenosis, lumbar region with neurogenic claudication: Secondary | ICD-10-CM

## 2019-04-02 DIAGNOSIS — K219 Gastro-esophageal reflux disease without esophagitis: Secondary | ICD-10-CM | POA: Diagnosis not present

## 2019-04-02 DIAGNOSIS — I10 Essential (primary) hypertension: Secondary | ICD-10-CM

## 2019-04-02 MED ORDER — ESOMEPRAZOLE MAGNESIUM 20 MG PO CPDR: DELAYED_RELEASE_CAPSULE | ORAL | 0 refills | Status: DC

## 2019-04-02 MED ORDER — ALPRAZOLAM 0.25 MG PO TABS: ORAL_TABLET | ORAL | 0 refills | Status: DC

## 2019-04-02 NOTE — Progress Notes (Signed)
This service is provided via telemedicine  No vital signs collected/recorded due to the encounter was a telemedicine visit.   Location of patient (ex: home, work):  Home  Patient consents to a telephone visit:  Yes  Location of the provider (ex: office, home):  [employer], office  Names of all persons participating in the telemedicine service and their role in the encounter:  Leafy Half CMA, [name] NP, Patient [name]   Time spent on call:  10 mins  Virtual Visit via Telephone Note  I connected with [name] on 04/02/19 at  3:45 PM EDT by telephone and verified that I am speaking with the correct person using two identifiers.   I discussed the limitations, risks, security and privacy concerns of performing an evaluation and management service by telephone and the availability of in person appointments. I also discussed with the patient that there may be a patient responsible charge related to this service. The patient expressed understanding and agreed to proceed.     Careteam: Patient Care Team: Lauree Chandler, NP as PCP - General (Geriatric Medicine) Martinique, Peter M, MD as PCP - Cardiology (Cardiology) Mcarthur Rossetti, MD as Attending Physician (Orthopedic Surgery) Larey Dresser, MD as Attending Physician (Cardiology) Thornell Sartorius, MD as Consulting Physician (Otolaryngology) Druscilla Brownie, MD as Consulting Physician (Dermatology) Calvert Cantor, MD as Consulting Physician (Ophthalmology) Debara Pickett Nadean Corwin, MD as Consulting Physician (Cardiology)  Advanced Directive information    Allergies  Allergen Reactions  . Advil [Ibuprofen]     GI upset  . Aleve [Naproxen Sodium]     GI upset  . Aspirin Nausea And Vomiting    Takes baby aspirin qod without problems  . Atorvastatin     Leg cramps  . Celebrex [Celecoxib]     GI upset  . Diclofenac     GI upset  . Doxycycline     Headache  . Fosamax [Alendronate  Sodium]     GI discomfort   . Metoclopramide Hcl     REACTION: heart palps  . Nsaids Other (See Comments)    Tears my stomach up   . Other     Antihistamine - increase BP  . Pravastatin     Leg cramps  . Timolol     Dropped HR    Chief Complaint  Patient presents with  . Medical Management of Chronic Issues    Follow up on meds for A fib.     HPI: Patient is a 85 y.o. female for routine follow up. Pt with hx of PAF, HTN, sinus node dysfunction s/p PPM, spinal stenosis of lumbar region.   Pt called and reported yeast under breast and rx sent for nystatin cream due to severity which improved area significantly. Now using powder and has continue to improved and now completely resolved.   GERD- controlled, uses esomeprazole as needed   Insomnia- using trazodone 1/2 tablet which has been effective.   Recently had trigger finger noted in right thumb and it was injected in July and exercises but continued to trigger which was painful therefore she had outpatient procedure to correct this on 09/26/18, moving hand better but still a little swollen. Pain has improved. Has follow up in 1 week.   Spinal stenosis-progressive back pain. Seeing neurosurgery and planned procedure but due to COVID-19 has been put off.  Has spasms from her back into her legs. Muscle relaxer did not help but has taken her xanax which helps  relax the back and significantly helps the spasm. Pain is crippling when she has then. Xanax 1/2 tablet with heating pad significantly helps and she is able to walk.  Has tramadol PRN but she does not wish to take that (afraid she will get hooked on it) taking tylenol 500 mg by mouth daily throughout the day. Generally takes about 4 a day which controls pain.   afib- following with cardiology, continues on lopressor and propafenone for rate controlled. Continues on eliquis for anticoagulation.   Constipation- drinks prune juice, takes Senakot twice a week and stool softener 5  times a week.   Hyperlipidemia- triglycerides elevated.   Hypertension- blood pressure has been at goal 120/78   Osteoporosis- recommended cal and vit D. Rx for fosamax but has been holding off on this because she thought she was having surgery for her back.   Plans to have mammogram after COVID-19 restriction lift.   Review of Systems:  Review of Systems  Constitutional: Negative for chills, fever and weight loss.  Respiratory: Negative for cough, sputum production and shortness of breath.   Cardiovascular: Negative for chest pain, palpitations and leg swelling.  Gastrointestinal: Positive for constipation and heartburn (with diet and nexium). Negative for abdominal pain and diarrhea.  Genitourinary: Negative for dysuria, frequency and urgency.  Musculoskeletal: Positive for back pain. Negative for falls, joint pain and myalgias.  Skin: Negative.   Neurological: Negative for dizziness and headaches.  Psychiatric/Behavioral: Negative for depression and memory loss. The patient does not have insomnia.     Past Medical History:  Diagnosis Date  . Allergic rhinitis due to pollen   . Anxiety state, unspecified   . Arthritis    "right knee" (06/21/2016)  . Asymptomatic varicose veins   . Carpal tunnel syndrome   . Chest pain, unspecified   . Chronic lower back pain    "on the left side" (06/21/2016)  . Cramp of limb   . Depressive disorder, not elsewhere classified   . Diaphragmatic hernia without mention of obstruction or gangrene   . Diaphragmatic hernia without mention of obstruction or gangrene   . Disturbance of skin sensation   . Diverticulosis of colon (without mention of hemorrhage)   . Enthesopathy of hip region   . Esophageal reflux   . Hepatitis, unspecified   . History of blood transfusion    "w/both knee replacements"  . History of duodenal ulcer   . Insomnia, unspecified   . Lumbago   . Migraine    "none since ~ 1990" (06/21/2016)  . Myalgia and myositis,  unspecified   . Obesity, unspecified   . Other abnormal blood chemistry   . Other dysphagia   . Other malaise and fatigue   . Other nonspecific abnormal serum enzyme levels   . Other specified cardiac dysrhythmias(427.89)   . Pacemaker   . Pain in joint, ankle and foot   . Pain in joint, hand   . Pain in joint, lower leg   . Pain in joint, pelvic region and thigh   . Pain in limb   . Plantar fascial fibromatosis   . Presence of permanent cardiac pacemaker   . Reflux esophagitis   . Sciatica   . Spinal stenosis, unspecified region other than cervical   . Stricture and stenosis of esophagus   . Symptomatic menopausal or female climacteric states   . Unspecified essential hypertension   . Unspecified essential hypertension   . Unspecified vitamin D deficiency    Past Surgical  History:  Procedure Laterality Date  . BREAST BIOPSY Left 1990s X 2  . CARDIOVERSION N/A 04/26/2016   Procedure: CARDIOVERSION;  Surgeon: Pixie Casino, MD;  Location: [facility];  Service: Cardiovascular;  Laterality: N/A;  . CATARACT EXTRACTION W/ INTRAOCULAR LENS IMPLANT Left 08/03/1999   DR EPES   . CATARACT EXTRACTION W/ INTRAOCULAR LENS IMPLANT Right 2000   DR EPES  . [place]  . COLONOSCOPY  1988   Normal   . DENTAL SURGERY Left 08/2016  . EP IMPLANTABLE DEVICE N/A 06/21/2016   Procedure: Pacemaker Implant;  Surgeon: Evans Lance, MD;  Location: [facility];  Service: Cardiovascular;  Laterality: N/A;  . ESOPHAGOGASTRODUODENOSCOPY (EGD) WITH ESOPHAGEAL DILATION  ~ 1982   Dr. Sharlett Iles  . EXCISION OF ACTINIC KERATOSIS     DR LUPTON   . EYE SURGERY    . INSERT / REPLACE / REMOVE PACEMAKER    . JOINT REPLACEMENT    . KNEE ARTHROSCOPY Left 2003  . KNEE ARTHROSCOPY Right 06-26-13  . KNEE CLOSED REDUCTION Right 10/23/2013   Procedure: CLOSED MANIPULATION RIGHT KNEE;  Surgeon: Mcarthur Rossetti, MD;  Location: [facility];  Service: Orthopedics;  Laterality:  Right;  . LASER FOR CLOUDY CAP LEFT EYE Left 03/2006   DR DIGBY  . TOTAL KNEE ARTHROPLASTY Left 04/2004   DR RENDALL  . TOTAL KNEE ARTHROPLASTY Right 07/04/2013   Procedure: RIGHT TOTAL KNEE ARTHROPLASTY;  Surgeon: Mcarthur Rossetti, MD;  Location: [facility];  Service: Orthopedics;  Laterality: Right;  . TRIGGER FINGER RELEASE Right   . VAGINAL HYSTERECTOMY  1979   Social History:   reports that she has never smoked. She has never used smokeless tobacco. She reports that she does not drink alcohol or use drugs.  Family History  Problem Relation Age of Onset  . Ovarian cancer Mother   . Uterine cancer Mother   . Cerebrovascular Accident Mother   . Heart disease Father   . Hypertension Brother   . Obesity Daughter     Medications: Patient's Medications  New Prescriptions   No medications on file  Previous Medications   ACETAMINOPHEN (TYLENOL) 500 MG TABLET    Take 500 mg by mouth every 6 (six) hours as needed for mild pain.    ALENDRONATE (FOSAMAX) 70 MG TABLET    Take 70 mg by mouth once a week. Take with a full glass of water on an empty stomach.   ALPRAZOLAM (XANAX) 0.25 MG TABLET    Take one tablet by mouth at bedtime as needed for rest or anxiety   ANTISEPTIC ORAL RINSE (BIOTENE) LIQD    15 mLs by Mouth Rinse route daily as needed for dry mouth.   APIXABAN (ELIQUIS) 5 MG TABS TABLET    Take 1 tablet (5 mg total) by mouth 2 (two) times daily.   BIMATOPROST (LUMIGAN) 0.01 % SOLN    Instill 1 drop into both eyes once daily in the evening   BIOTIN 5000 PO    Take 1 capsule by mouth daily.   CALCIUM CARB-CHOLECALCIFEROL (CALTRATE 600+D3) 600-800 MG-UNIT TABS    1 tablet twice daily   CHOLECALCIFEROL (VITAMIN D) 2000 UNITS TABLET    Take 2,000 Units by mouth daily.   DORZOLAMIDE (TRUSOPT) 2 % OPHTHALMIC SOLUTION    Place 1 drop into both eyes 3 (three) times daily.    ESOMEPRAZOLE (NEXIUM) 20 MG CAPSULE    TAKE 1 CAPSULE BY MOUTH EVERY  DAY   EZETIMIBE (ZETIA) 10 MG TABLET    TAKE  1 TABLET ONCE DAILY.   FLUTICASONE (FLONASE) 50 MCG/ACT NASAL SPRAY    Place 1 spray into both nostrils daily.   HYDROCHLOROTHIAZIDE (HYDRODIURIL) 25 MG TABLET    TAKE 1 TABLET BY MOUTH EVERY DAY   KETOPROFEN 10 % CREA    Apply 1 application topically daily as needed (pain). 20% cream; PRN for pain.   METHYLCELLULOSE (ARTIFICIAL TEARS) 1 % OPHTHALMIC SOLUTION    Place 1 drop into both eyes daily as needed (for dry eyes).    METHYLPREDNISOLONE (MEDROL) 4 MG TABLET    Take as directed   METOPROLOL TARTRATE (LOPRESSOR) 50 MG TABLET    TAKE 1 TABLET BY MOUTH TWICE A DAY   NYSTATIN (MYCOSTATIN/NYSTOP) POWDER    Apply topically 3 (three) times daily.   NYSTATIN CREAM (MYCOSTATIN)    Apply 1 application topically 2 (two) times daily.   PROPAFENONE (RYTHMOL SR) 225 MG 12 HR CAPSULE    TAKE 1 CAPSULE (225 MG TOTAL) BY MOUTH 2 (TWO) TIMES DAILY.   TRAZODONE (DESYREL) 50 MG TABLET    TAKE 1 TABLET BY MOUTH EVERY NIGHT TO HELP WITH SLEEP   VALSARTAN (DIOVAN) 320 MG TABLET    TAKE 1 TABLET BY MOUTH EVERY DAY  Modified Medications   No medications on file  Discontinued Medications   No medications on file     Physical Exam: unable due televist   Labs reviewed: Basic Metabolic Panel: Recent Labs    05/07/18 0925 10/02/18 1352 03/27/19 0901  NA 139 135 139  K 3.5 3.8 3.8  CL 103 101 102  CO2 31 31 30   GLUCOSE 99 87 97  BUN 16 14 15   CREATININE 0.69 0.83 0.73  CALCIUM 8.9 8.8 9.2   Liver Function Tests: Recent Labs    05/07/18 0925 03/27/19 0901  AST 22 17  ALT 23 15  BILITOT 0.5 0.4  PROT 6.5 6.3   No results for input(s): LIPASE, AMYLASE in the last 8760 hours. No results for input(s): AMMONIA in the last 8760 hours. CBC: Recent Labs    05/07/18 0925 10/02/18 1352 03/27/19 0901  WBC 4.6 6.1 5.3  NEUTROABS 2,047 3,215 2,475  HGB 12.6 12.4 12.3  HCT 35.9 36.6 36.4  MCV 89.3 91.0 92.9  PLT 180 171 188   Lipid Panel: Recent Labs    05/07/18 0925 03/27/19 0901  CHOL  151 162  HDL 39* 37*  LDLCALC 85 93  TRIG 160* 233*  CHOLHDL 3.9 4.4   TSH: No results for input(s): TSH in the last 8760 hours. A1C: Lab Results  Component Value Date   HGBA1C 5.2 08/06/2017     Assessment/Plan 1. Gastroesophageal reflux disease without esophagitis Controlled with diet and nexium - esomeprazole (NEXIUM) 20 MG capsule; TAKE 1 CAPSULE BY MOUTH EVERY DAY  Dispense: 30 capsule; Refill: 0  2. Insomnia, unspecified type -stable on trazodone, will use xanax as needed occasionally for anxiety - ALPRAZolam (XANAX) 0.25 MG tablet; Take one tablet by mouth at bedtime as needed for rest or anxiety  Dispense: 30 tablet; Refill: 0  3. Osteoporosis, unspecified osteoporosis type, unspecified pathological fracture presence -plans to restart fosamax, previously experience some GERD but states it was resolved with nexium. Will notify if symptoms worsen. Continues on cal and vit d  4. Paroxysmal atrial fibrillation (HCC) Rate controlled, recent follow up with cardiology. Continues on eliquis for anticoagulation.  - CBC with Differential/Platelet; Future  5. Spinal stenosis of lumbar region with neurogenic claudication Ongoing follow up with neurosurgery due to back pain. Using xanax 1/2 tablet occasionally for severe cramping which has helped significantly compared to muscle relaxer that she has tired. No side effects noted. Continues on tylenol PRN.   6. Constipation, unspecified constipation type -controlled on current regimen  7. Essential hypertension -taking blood pressure at home and reads at goal. Continue current regimen.  - COMPLETE METABOLIC PANEL WITH GFR; Future  8. HYPERCHOLESTEROLEMIA LDL at goal, triglycerides elevated, continues on zetia. Discussed dietary modifications as she reports her and her husband "eat a lot of junk" - Lipid Panel; Future - COMPLETE METABOLIC PANEL WITH GFR; Future  9. Anxiety state Stable at this times, has xanax PRN  Next appt:  09/29/2019 Janett Billow K. Harle Battiest  [facility] [phone]  Follow Up Instructions:    I discussed the assessment and treatment plan with the patient. The patient was provided an opportunity to ask questions and all were answered. The patient agreed with the plan and demonstrated an understanding of the instructions.   The patient was advised to call back or seek an in-person evaluation if the symptoms worsen or if the condition fails to improve as anticipated.  I provided 23 minutes of non-face-to-face time during this encounter.   [name], NP

## 2019-04-02 NOTE — Patient Instructions (Addendum)
Start taking fosamax weekly with cal and vit D, weight bearing exercise recommended.   Diet modifications encouraged to help lower triglycerides.    Dyslipidemia Dyslipidemia is an imbalance of waxy, fat-like substances (lipids) in the blood. The body needs lipids in small amounts. Dyslipidemia often involves a high level of cholesterol or triglycerides, which are types of lipids. Common forms of dyslipidemia include:  High levels of LDL cholesterol. LDL is the type of cholesterol that causes fatty deposits (plaques) to build up in the blood vessels that carry blood away from your heart (arteries).  Low levels of HDL cholesterol. HDL cholesterol is the type of cholesterol that protects against heart disease. High levels of HDL remove the LDL buildup from arteries.  High levels of triglycerides. Triglycerides are a fatty substance in the blood that is linked to a buildup of plaques in the arteries. What are the causes? Primary dyslipidemia is caused by changes (mutations) in genes that are passed down through families (inherited). These mutations cause several types of dyslipidemia. Secondary dyslipidemia is caused by lifestyle choices and diseases that lead to dyslipidemia, such as:  Eating a diet that is high in animal fat.  Not getting enough exercise.  Having diabetes, kidney disease, liver disease, or thyroid disease.  Drinking large amounts of alcohol.  Using certain medicines. What increases the risk? You are more likely to develop this condition if you are an older man or if you are a woman who has gone through menopause. Other risk factors include:  Having a family history of dyslipidemia.  Taking certain medicines, including birth control pills, steroids, some diuretics, and beta-blockers.  Smoking cigarettes.  Eating a high-fat diet.  Having certain medical conditions such as diabetes, polycystic ovary syndrome (PCOS), kidney disease, liver disease, or hypothyroidism.   Not exercising regularly.  Being overweight or obese with too much belly fat. What are the signs or symptoms? In most cases, dyslipidemia does not usually cause any symptoms. In severe cases, very high lipid levels can cause:  Fatty bumps under the skin (xanthomas).  White or gray ring around the black center (pupil) of the eye. Very high triglyceride levels can cause inflammation of the pancreas (pancreatitis). How is this diagnosed? Your health care provider may diagnose dyslipidemia based on a routine blood test (fasting blood test). Because most people do not have symptoms of the condition, this blood testing (lipid profile) is done on adults age 3 and older and is repeated every 5 years. This test checks:  Total cholesterol. This measures the total amount of cholesterol in your blood, including LDL cholesterol, HDL cholesterol, and triglycerides. A healthy number is below 200.  LDL cholesterol. The target number for LDL cholesterol is different for each person, depending on individual risk factors. Ask your health care provider what your LDL cholesterol should be.  HDL cholesterol. An HDL level of 60 or higher is best because it helps to protect against heart disease. A number below 54 for men or below 49 for women increases the risk for heart disease.  Triglycerides. A healthy triglyceride number is below 150. If your lipid profile is abnormal, your health care provider may do other blood tests. How is this treated? Treatment depends on the type of dyslipidemia that you have and your other risk factors for heart disease and stroke. Your health care provider will have a target range for your lipid levels based on this information. For many people, this condition may be treated by lifestyle changes, such as diet  and exercise. Your health care provider may recommend that you:  Get regular exercise.  Make changes to your diet.  Quit smoking if you smoke. If diet changes and  exercise do not help you reach your goals, your health care provider may also prescribe medicine to lower lipids. The most commonly prescribed type of medicine lowers your LDL cholesterol (statin drug). If you have a high triglyceride level, your provider may prescribe another type of drug (fibrate) or an omega-3 fish oil supplement, or both. Follow these instructions at home:  Eating and drinking  Follow instructions from your health care provider or dietitian about eating or drinking restrictions.  Eat a healthy diet as told by your health care provider. This can help you reach and maintain a healthy weight, lower your LDL cholesterol, and raise your HDL cholesterol. This may include: ? Limiting your calories, if you are overweight. ? Eating more fruits, vegetables, whole grains, fish, and lean meats. ? Limiting saturated fat, trans fat, and cholesterol.  If you drink alcohol: ? Limit how much you use. ? Be aware of how much alcohol is in your drink. In the [place] one drink equals one 12 oz bottle of beer (355 mL), one 5 oz glass of wine (148 mL), or one 1 oz glass of hard liquor (44 mL).  Do not drink alcohol if: ? Your health care provider tells you not to drink. ? You are pregnant, may be pregnant, or are planning to become pregnant. Activity  Get regular exercise. Start an exercise and strength training program as told by your health care provider. Ask your health care provider what activities are safe for you. Your health care provider may recommend: ? 30 minutes of aerobic activity 4-6 days a week. Brisk walking is an example of aerobic activity. ? Strength training 2 days a week. General instructions  Do not use any products that contain nicotine or tobacco, such as cigarettes, e-cigarettes, and chewing tobacco. If you need help quitting, ask your health care provider.  Take over-the-counter and prescription medicines only as told by your health care provider. This includes  supplements.  Keep all follow-up visits as told by your health care provider. Contact a health care provider if:  You are: ? Having trouble sticking to your exercise or diet plan. ? Struggling to quit smoking or control your use of alcohol. Summary  Dyslipidemia often involves a high level of cholesterol or triglycerides, which are types of lipids.  Treatment depends on the type of dyslipidemia that you have and your other risk factors for heart disease and stroke.  For many people, treatment starts with lifestyle changes, such as diet and exercise.  Your health care provider may prescribe medicine to lower lipids. This information is not intended to replace advice given to you by your health care provider. Make sure you discuss any questions you have with your health care provider. Document Released: 12/16/2013 Document Revised: 08/05/2018 Document Reviewed: 07/12/2018 Elsevier Interactive Patient Education  [employer].

## 2019-04-03 ENCOUNTER — Ambulatory Visit: Payer: Medicare Other | Admitting: Nurse Practitioner

## 2019-04-15 ENCOUNTER — Inpatient Hospital Stay: Admission: RE | Admit: 2019-04-15 | Payer: Medicare Other | Source: Ambulatory Visit

## 2019-04-15 ENCOUNTER — Other Ambulatory Visit: Payer: Medicare Other

## 2019-04-25 ENCOUNTER — Other Ambulatory Visit: Payer: Self-pay | Admitting: Nurse Practitioner

## 2019-04-25 DIAGNOSIS — K219 Gastro-esophageal reflux disease without esophagitis: Secondary | ICD-10-CM

## 2019-04-28 ENCOUNTER — Ambulatory Visit: Payer: Medicare Other | Admitting: Cardiology

## 2019-05-06 ENCOUNTER — Telehealth: Payer: Self-pay

## 2019-05-06 NOTE — Telephone Encounter (Signed)
Phone call to patient to verify medication list and allergies for myelogram procedure. Pt instructed to hold Trazodone for 48hrs prior to myelogram appointment time. Pt also aware she will need to hold Eliquis for 48hrs (pending approval and further recommendation from Dr. Peter Martinique). Pt verbalized understanding. [place]inner hold request faxed to cardiology office. Awaiting response.

## 2019-05-09 ENCOUNTER — Telehealth: Payer: Self-pay

## 2019-05-09 NOTE — Telephone Encounter (Signed)
   [facility] Pre-operative Risk Assessment    Request for surgical clearance:  1. What type of surgery is being performed? Myelogram   2. When is this surgery scheduled? TBD  3. What type of clearance is required Pharmacy  4. Are there any medications that need to be held prior to surgery and how long? Eliquis 48 hrs prior  5. Practice name and name of physician performing surgery? [facility]   6. What is your office phone number [phone]   7.   What is your office fax number        [phone]  8.   Anesthesia type Not listed   Kathyrn Lass 05/09/2019, 5:35 PM  _________________________________________________________________   (provider comments below)

## 2019-05-12 NOTE — Telephone Encounter (Signed)
   Primary Cardiologist: Peter Martinique, MD  Chart reviewed as part of pre-operative protocol coverage. Patient was contacted 05/12/2019 in reference to pre-operative risk assessment for pending surgery as outlined below.  [name] was last seen on 03/17/19 by Dr. Martinique.  Since that day, [name] has done well. Her mobility is limited by back pain. She denies any new or worsening cardiac symptoms.  Per Dr. Martinique and our pharmacy staff: Procedure: Myelogram Date of procedure: TBD  CHADS2-VASc score of  4 ( HTN, AGE x 2, AGE, female)  CrCl > 23ml/min  *Per office protocol,  all DOACs hold 72 hours for spinal procedures*  Noted no CAHDS-Vasc of 4 wotou hx of VTE/TIA/Stoke.   Please follow DR Jordan;s recommendations of holding Eliquis for 3 days*   When I spoke with the patient today for clearance, she took her eliquis this morning - 05/12/19. Therefore, patient may not have myelogram until Friday 05/16/19.  I will fax to the requesting office to reschedule the myelogram that was scheduled for 05/15/19.   Therefore, based on ACC/AHA guidelines, the patient would be at acceptable risk for the planned procedure without further cardiovascular testing.   I will route this recommendation to the requesting party via Epic fax function and remove from pre-op pool.  Please call with questions.  [place], PA 05/12/2019, 3:09 PM

## 2019-05-12 NOTE — Telephone Encounter (Signed)
Procedure: Myelogram Date of procedure: TBD  CHADS2-VASc score of  4 ( HTN, AGE x 2, AGE, female)  CrCl > 5ml/min  *Per office protocol,  all DOACs hold 72 hours for spinal procedures*  Noted no CAHDS-Vasc of 4 wotou hx of VTE/TIA/Stoke.   Please follow DR Jordan;s recommendations of holding Eliquis for 3 days*

## 2019-05-12 NOTE — Telephone Encounter (Signed)
Request to hold eliquis for 2 days by [facility], Dr. Doug Sou last note said to hold for 3 days. Can you please clarify which?  Thanks

## 2019-05-12 NOTE — Telephone Encounter (Signed)
Left message, will attempt today after 3pm.

## 2019-05-15 ENCOUNTER — Other Ambulatory Visit: Payer: Medicare Other

## 2019-05-15 ENCOUNTER — Inpatient Hospital Stay: Admission: RE | Admit: 2019-05-15 | Payer: Medicare Other | Source: Ambulatory Visit

## 2019-05-20 ENCOUNTER — Telehealth: Payer: Self-pay | Admitting: Physician Assistant

## 2019-05-20 ENCOUNTER — Other Ambulatory Visit: Payer: Self-pay | Admitting: Internal Medicine

## 2019-05-20 MED ORDER — TRAMADOL HCL 50 MG PO TABS: 50.0000 mg | ORAL_TABLET | Freq: Four times a day (QID) | ORAL | 0 refills | Status: DC | PRN

## 2019-05-20 NOTE — Telephone Encounter (Signed)
Please advise 

## 2019-05-20 NOTE — Telephone Encounter (Signed)
Rx refill Tramadol [facility]

## 2019-05-20 NOTE — Telephone Encounter (Signed)
I refilled it

## 2019-05-28 ENCOUNTER — Other Ambulatory Visit: Payer: Self-pay

## 2019-05-28 ENCOUNTER — Ambulatory Visit
Admission: RE | Admit: 2019-05-28 | Discharge: 2019-05-28 | Disposition: A | Discharge status: Home / Self Care | Payer: Medicare Other | Source: Ambulatory Visit | Attending: Neurological Surgery | Admitting: Neurological Surgery

## 2019-05-28 DIAGNOSIS — M545 Low back pain, unspecified: Secondary | ICD-10-CM

## 2019-05-28 DIAGNOSIS — M48061 Spinal stenosis, lumbar region without neurogenic claudication: Secondary | ICD-10-CM | POA: Diagnosis not present

## 2019-05-28 DIAGNOSIS — G8929 Other chronic pain: Secondary | ICD-10-CM

## 2019-05-28 MED ORDER — ONDANSETRON HCL 4 MG/2ML IJ SOLN
4.0000 mg | Freq: Once | INTRAMUSCULAR | Status: AC
  Administered 2019-05-28: 4 mg via INTRAMUSCULAR

## 2019-05-28 MED ORDER — IOPAMIDOL (ISOVUE-M 200) INJECTION 41%: 18.0000 mL | Freq: Once | INTRAMUSCULAR | Status: DC

## 2019-05-28 MED ORDER — MEPERIDINE HCL 100 MG/ML IJ SOLN
50.0000 mg | Freq: Once | INTRAMUSCULAR | Status: AC
  Administered 2019-05-28: 50 mg via INTRAMUSCULAR

## 2019-05-28 MED ORDER — DIAZEPAM 5 MG PO TABS
5.0000 mg | ORAL_TABLET | Freq: Once | ORAL | Status: AC
  Administered 2019-05-28: 5 mg via ORAL

## 2019-05-28 NOTE — Discharge Instructions (Signed)
Myelogram Discharge Instructions  1. Go home and rest quietly for the next 24 hours.  It is important to lie flat for the next 24 hours.  Get up only to go to the restroom.  You may lie in the bed or on a couch on your back, your stomach, your left side or your right side.  You may have one pillow under your head.  You may have pillows between your knees while you are on your side or under your knees while you are on your back.  2. DO NOT drive today.  Recline the seat as far back as it will go, while still wearing your seat belt, on the way home.  3. You may get up to go to the bathroom as needed.  You may sit up for 10 minutes to eat.  You may resume your normal diet and medications unless otherwise indicated.  Drink lots of extra fluids today and tomorrow.  4. The incidence of headache, nausea, or vomiting is about 5% (one in 20 patients).  If you develop a headache, lie flat and drink plenty of fluids until the headache goes away.  Caffeinated beverages may be helpful.  If you develop severe nausea and vomiting or a headache that does not go away with flat bed rest, call [phone].  5. You may resume normal activities after your 24 hours of bed rest is over; however, do not exert yourself strongly or do any heavy lifting tomorrow. If when you get up you have a headache when standing, go back to bed and force fluids for another 24 hours.  6. Call your physician for a follow-up appointment.  The results of your myelogram will be sent directly to your physician by the following day.  7. If you have any questions or if complications develop after you arrive home, please call [phone].  Discharge instructions have been explained to the patient.  The patient, or the person responsible for the patient, fully understands these instructions.  YOU MAY RESTART YOUR ELIQUIS TODAY. YOU MAY RESTART YOUR TRAZODONE AND TRAMADOL TOMORROW 05/29/2019 AT 10:10AM.

## 2019-05-28 NOTE — Progress Notes (Signed)
Pt reports she has been off of her Eliquis for 3 days and her trazodone and tramadol for 2 days for her myelogram procedure today.

## 2019-05-29 DIAGNOSIS — I10 Essential (primary) hypertension: Secondary | ICD-10-CM | POA: Diagnosis not present

## 2019-05-29 DIAGNOSIS — M545 Low back pain: Secondary | ICD-10-CM | POA: Diagnosis not present

## 2019-06-02 ENCOUNTER — Telehealth: Payer: Self-pay

## 2019-06-02 NOTE — Telephone Encounter (Signed)
   Primary Cardiologist: Peter Martinique, MD  Chart reviewed as part of pre-operative protocol coverage. Patient was contacted 06/02/2019 in reference to pre-operative risk assessment for pending surgery as outlined below.  [name] was last seen on 03/17/19 by Dr. Martinique.  Since that day, [name] has done well. No exertional CP and dyspnea.   At time of last office visit w/ Dr. Martinique 03/17/19, he was aware of her DJD and potential need for myelogram or other related surgical procedures. Per Dr. Doug Sou note, "Eliquis may be held for 3 days for Myelogram or surgical procedures if it comes to that".    Therefore, based on ACC/AHA guidelines, the patient would be at acceptable risk for the planned procedure without further cardiovascular testing.   Per Dr. Martinique, Eliquis may be held for 3 days for Myelogram or surgical procedures.   I will route this recommendation to the requesting party via Epic fax function and remove from pre-op pool.  Please call with questions.  Lyda Jester, PA-C 06/02/2019, 10:19 AM

## 2019-06-02 NOTE — Telephone Encounter (Signed)
   [facility] Pre-operative Risk Assessment    Request for surgical clearance:  1. What type of surgery is being performed? L4-5 Lumbar Laminectomy   2. When is this surgery scheduled? TBD   3. What type of clearance is required (medical clearance vs. Pharmacy clearance to hold med vs. Both)? Both  4. Are there any medications that need to be held prior to surgery and how long? Eliquis   5. Practice name and name of physician performing surgery? Dr. Camelia Eng Jones/[facility]   6. What is your office phone number [phone]    7.   What is your office fax number [phone]  8.   Anesthesia type (None, local, MAC, general) ? General   Mady Haagensen 06/02/2019, 10:06 AM  _________________________________________________________________   (provider comments below)

## 2019-06-03 ENCOUNTER — Other Ambulatory Visit: Payer: Self-pay | Admitting: Neurological Surgery

## 2019-06-07 ENCOUNTER — Other Ambulatory Visit: Payer: Self-pay | Admitting: Nurse Practitioner

## 2019-06-07 DIAGNOSIS — G4700 Insomnia, unspecified: Secondary | ICD-10-CM

## 2019-06-09 ENCOUNTER — Encounter (HOSPITAL_COMMUNITY): Payer: Self-pay | Admitting: *Deleted

## 2019-06-09 ENCOUNTER — Ambulatory Visit (HOSPITAL_COMMUNITY)
Admission: RE | Admit: 2019-06-09 | Discharge: 2019-06-09 | Disposition: A | Discharge status: Home / Self Care | Payer: Medicare Other | Source: Ambulatory Visit | Attending: Neurological Surgery | Admitting: Neurological Surgery

## 2019-06-09 ENCOUNTER — Encounter (HOSPITAL_COMMUNITY)
Admission: RE | Admit: 2019-06-09 | Discharge: 2019-06-09 | Disposition: A | Discharge status: Home / Self Care | Payer: Medicare Other | Source: Ambulatory Visit | Attending: Neurological Surgery | Admitting: Neurological Surgery

## 2019-06-09 ENCOUNTER — Other Ambulatory Visit (HOSPITAL_COMMUNITY)
Admission: RE | Admit: 2019-06-09 | Discharge: 2019-06-09 | Disposition: A | Discharge status: Home / Self Care | Payer: Medicare Other | Source: Ambulatory Visit | Attending: Neurological Surgery | Admitting: Neurological Surgery

## 2019-06-09 ENCOUNTER — Other Ambulatory Visit: Payer: Self-pay

## 2019-06-09 DIAGNOSIS — Z7901 Long term (current) use of anticoagulants: Secondary | ICD-10-CM | POA: Diagnosis not present

## 2019-06-09 DIAGNOSIS — Z1159 Encounter for screening for other viral diseases: Secondary | ICD-10-CM | POA: Diagnosis not present

## 2019-06-09 DIAGNOSIS — J439 Emphysema, unspecified: Secondary | ICD-10-CM | POA: Insufficient documentation

## 2019-06-09 DIAGNOSIS — Z7983 Long term (current) use of bisphosphonates: Secondary | ICD-10-CM | POA: Diagnosis not present

## 2019-06-09 DIAGNOSIS — I10 Essential (primary) hypertension: Secondary | ICD-10-CM | POA: Diagnosis not present

## 2019-06-09 DIAGNOSIS — Z79899 Other long term (current) drug therapy: Secondary | ICD-10-CM | POA: Diagnosis not present

## 2019-06-09 DIAGNOSIS — M48061 Spinal stenosis, lumbar region without neurogenic claudication: Secondary | ICD-10-CM | POA: Diagnosis not present

## 2019-06-09 DIAGNOSIS — M4316 Spondylolisthesis, lumbar region: Secondary | ICD-10-CM | POA: Diagnosis not present

## 2019-06-09 DIAGNOSIS — E559 Vitamin D deficiency, unspecified: Secondary | ICD-10-CM | POA: Diagnosis not present

## 2019-06-09 DIAGNOSIS — Z950 Presence of cardiac pacemaker: Secondary | ICD-10-CM | POA: Diagnosis not present

## 2019-06-09 DIAGNOSIS — F419 Anxiety disorder, unspecified: Secondary | ICD-10-CM | POA: Diagnosis not present

## 2019-06-09 DIAGNOSIS — G4700 Insomnia, unspecified: Secondary | ICD-10-CM | POA: Diagnosis not present

## 2019-06-09 DIAGNOSIS — Z96653 Presence of artificial knee joint, bilateral: Secondary | ICD-10-CM | POA: Diagnosis not present

## 2019-06-09 LAB — BASIC METABOLIC PANEL WITH GFR
Anion gap: 9 (ref 5–15)
BUN: 11 mg/dL (ref 8–23)
CO2: 28 mmol/L (ref 22–32)
Calcium: 9 mg/dL (ref 8.9–10.3)
Chloride: 101 mmol/L (ref 98–111)
Creatinine, Ser: 0.7 mg/dL (ref 0.44–1.00)
GFR calc Af Amer: >60 mL/min
GFR calc non Af Amer: >60 mL/min
Glucose, Bld: 107 mg/dL — ABNORMAL HIGH (ref 70–99)
Potassium: 3.7 mmol/L (ref 3.5–5.1)
Sodium: 138 mmol/L (ref 135–145)

## 2019-06-09 LAB — SURGICAL PCR SCREEN
MRSA, PCR: NEGATIVE
Staphylococcus aureus: NEGATIVE

## 2019-06-09 LAB — PROTIME-INR
INR: 1.1 (ref 0.8–1.2)
Prothrombin Time: 14.3 s (ref 11.4–15.2)

## 2019-06-09 LAB — CBC WITH DIFFERENTIAL/PLATELET
Abs Immature Granulocytes: 0.02 10*3/uL (ref 0.00–0.07)
Basophils Absolute: 0.1 10*3/uL (ref 0.0–0.1)
Basophils Relative: 1 %
Eosinophils Absolute: 0.1 10*3/uL (ref 0.0–0.5)
Eosinophils Relative: 2 %
HCT: 41.1 % (ref 36.0–46.0)
Hemoglobin: 13.3 g/dL (ref 12.0–15.0)
Immature Granulocytes: 0 %
Lymphocytes Relative: 31 %
Lymphs Abs: 2.3 10*3/uL (ref 0.7–4.0)
MCH: 30.9 pg (ref 26.0–34.0)
MCHC: 32.4 g/dL (ref 30.0–36.0)
MCV: 95.4 fL (ref 80.0–100.0)
Monocytes Absolute: 0.5 10*3/uL (ref 0.1–1.0)
Monocytes Relative: 7 %
Neutro Abs: 4.5 10*3/uL (ref 1.7–7.7)
Neutrophils Relative %: 59 %
Platelets: 186 10*3/uL (ref 150–400)
RBC: 4.31 MIL/uL (ref 3.87–5.11)
RDW: 12.6 % (ref 11.5–15.5)
WBC: 7.5 10*3/uL (ref 4.0–10.5)
nRBC: 0 % (ref 0.0–0.2)

## 2019-06-09 NOTE — Progress Notes (Signed)
PCP - [name], PA- C  Cardiologist - Dr. Martinique Dr. Lovena Le  Chest x-ray - 06/09/2019  EKG - 05/17/2019  Stress Test - 2017  ECHO - 2018  Cardiac Cath - no  Pacemaker - St Jude- I faxed a request to the [facility] fioorders  Sleep Study - no  LABS-CBC with diff, BMP, PCR  ASA- not instructed  Eliquis - last dose Sunday, June 14- Dr Martinique gave permission to hold 3 days. Dr Martinique gave cardiac clearance  ERAS-no  HA1C-na Fasting Blood Sugar - na Checks Blood Sugar ____o_ times a day  Anesthesia-  Pt denies having chest pain, sob, or fever at this time. All instructions explained to the pt, with a verbal understanding of the material. Pt agrees to go over the instructions while at home for a better understanding. The opportunity to ask questions was provided. [name] denies that she or her family has experienced any of the following: Cough Fever >100.4 Runny Nose Sore Throat Difficulty breathing/ shortness of breath Travel in past 14 days -no Patient will have COVID test to day and then quarantine with her husband

## 2019-06-09 NOTE — Pre-Procedure Instructions (Signed)
AVIANAH PELLMAN  06/09/2019    Your procedure is scheduled on Thursday, June 18.  Report to [facility], Main Entrance or Entrance "A" at 5:30 AM                   Your surgery or procedure is scheduled for 7:30 AM   Call this number if you have problems the morning of surgery: [phone]  This is the number for the Pre- Surgical Desk.  For any other questions, please call [phone], Monday - Friday 8 AM - 4 PM.     Remember:  Do not eat or drink after midnight Wednesday, June 17.   Take these medicines the morning of surgery with A SIP OF WATER:  metoprolol tartrate (LOPRESSOR)             propafenone (RYTHMOL SR)              Eye drops  Take if needed:  acetaminophen (TYLENOL) ALPRAZolam (XANAX) fluticasone (FLONASE)  Artificial tears traMADol (ULTRAM)   Stop Eliquis 3 days ahead as instructed by your surgeon  STOP taking Aspirin, Aspirin Products (Goody Powder, Excedrin Migraine), Ibuprofen (Advil), Naproxen (Aleve), Vitamins and Herbal Products (ie Fish Oil).  Special instructions:   [place]- Preparing For Surgery  Before surgery, you can play an important role. Because skin is not sterile, your skin needs to be as free of germs as possible. You can reduce the number of germs on your skin by washing with CHG (chlorahexidine gluconate) Soap before surgery.  CHG is an antiseptic cleaner which kills germs and bonds with the skin to continue killing germs even after washing.    Oral Hygiene is also important to reduce your risk of infection.  Remember - BRUSH YOUR TEETH THE MORNING OF SURGERY WITH YOUR REGULAR TOOTHPASTE  Please do not use if you have an allergy to CHG or antibacterial soaps. If your skin becomes reddened/irritated stop using the CHG.  Do not shave (including legs and underarms) for at least 48 hours prior to first CHG shower. It is OK to shave your face.  Please follow these instructions carefully.   1. Shower the NIGHT BEFORE  SURGERY and the MORNING OF SURGERY with CHG.   2. If you chose to wash your hair, wash your hair first as usual with your normal shampoo.  3. After you shampoo, wash your face and private area with the soap you use at home, then rinse your hair and body thoroughly to remove the shampoo and soap.  4. Use CHG as you would any other liquid soap. You can apply CHG directly to the skin and wash gently with a scrungie or a clean washcloth.   5. Apply the CHG Soap to your body ONLY FROM THE NECK DOWN.  Do not use on open wounds or open sores. Avoid contact with your eyes, ears, mouth and genitals (private parts).   6. Wash thoroughly, paying special attention to the area where your surgery will be performed.  7. Thoroughly rinse your body with warm water from the neck down.  8. DO NOT shower/wash with your normal soap after using and rinsing off the CHG Soap.  9. Pat yourself dry with a CLEAN TOWEL.  10. Wear CLEAN PAJAMAS to bed the night before surgery, wear comfortable clothes the morning of surgery  11. Place CLEAN SHEETS on your bed the night of your first shower and DO NOT SLEEP WITH PETS.  Day of Surgery: Shower as Instructed above  Do not wear lotions, powders, or perfumes, or deodorant.  Please wear clean clothes to the hospital/surgery center.   Remember to brush your teeth WITH YOUR REGULAR TOOTHPASTE.  Do not wear jewelry, make-up or nail polish.  Do not shave 48 hours prior to surgery.   Do not bring valuables to the hospital.  [facility] is not responsible for any belongings or valuables.  Contacts, dentures or bridgework may not be worn into surgery.  Leave your suitcase in the car.  After surgery it may be brought to your room.  For patients admitted to the hospital, discharge time will be determined by your treatment team.  Patients discharged the day of surgery will not be allowed to drive home.   Please read over the following fact sheets that you were given:   Pain Management, Coughing and Deep Breathing, Surgigical Site Infections, 10 Things you can do to [place] at [facility]

## 2019-06-11 LAB — NOVEL CORONAVIRUS, NAA (HOSP ORDER, SEND-OUT TO REF LAB; TAT 18-24 HRS): SARS-CoV-2, NAA: NOT DETECTED

## 2019-06-12 ENCOUNTER — Ambulatory Visit (HOSPITAL_COMMUNITY): Payer: Medicare Other | Admitting: Certified Registered Nurse Anesthetist

## 2019-06-12 ENCOUNTER — Encounter (HOSPITAL_COMMUNITY)
Admission: RE | Disposition: A | Discharge status: Home / Self Care | Payer: Self-pay | Source: Home / Self Care | Attending: Neurological Surgery

## 2019-06-12 ENCOUNTER — Ambulatory Visit (HOSPITAL_COMMUNITY)
Admission: RE | Admit: 2019-06-12 | Discharge: 2019-06-13 | Disposition: A | Discharge status: Home / Self Care | Payer: Medicare Other | Attending: Neurological Surgery | Admitting: Neurological Surgery

## 2019-06-12 ENCOUNTER — Encounter (HOSPITAL_COMMUNITY): Payer: Self-pay

## 2019-06-12 ENCOUNTER — Ambulatory Visit (HOSPITAL_COMMUNITY): Payer: Medicare Other

## 2019-06-12 ENCOUNTER — Other Ambulatory Visit: Payer: Self-pay | Admitting: Nurse Practitioner

## 2019-06-12 ENCOUNTER — Other Ambulatory Visit: Payer: Self-pay

## 2019-06-12 DIAGNOSIS — E559 Vitamin D deficiency, unspecified: Secondary | ICD-10-CM | POA: Diagnosis not present

## 2019-06-12 DIAGNOSIS — G4700 Insomnia, unspecified: Secondary | ICD-10-CM | POA: Insufficient documentation

## 2019-06-12 DIAGNOSIS — Z96653 Presence of artificial knee joint, bilateral: Secondary | ICD-10-CM | POA: Insufficient documentation

## 2019-06-12 DIAGNOSIS — Z7983 Long term (current) use of bisphosphonates: Secondary | ICD-10-CM | POA: Diagnosis not present

## 2019-06-12 DIAGNOSIS — Z419 Encounter for procedure for purposes other than remedying health state, unspecified: Secondary | ICD-10-CM

## 2019-06-12 DIAGNOSIS — Z950 Presence of cardiac pacemaker: Secondary | ICD-10-CM | POA: Diagnosis not present

## 2019-06-12 DIAGNOSIS — Z9889 Other specified postprocedural states: Secondary | ICD-10-CM

## 2019-06-12 DIAGNOSIS — I10 Essential (primary) hypertension: Secondary | ICD-10-CM | POA: Insufficient documentation

## 2019-06-12 DIAGNOSIS — M48061 Spinal stenosis, lumbar region without neurogenic claudication: Secondary | ICD-10-CM | POA: Insufficient documentation

## 2019-06-12 DIAGNOSIS — M4316 Spondylolisthesis, lumbar region: Secondary | ICD-10-CM | POA: Diagnosis not present

## 2019-06-12 DIAGNOSIS — F419 Anxiety disorder, unspecified: Secondary | ICD-10-CM | POA: Insufficient documentation

## 2019-06-12 DIAGNOSIS — Z79899 Other long term (current) drug therapy: Secondary | ICD-10-CM | POA: Diagnosis not present

## 2019-06-12 DIAGNOSIS — E7800 Pure hypercholesterolemia, unspecified: Secondary | ICD-10-CM | POA: Diagnosis not present

## 2019-06-12 DIAGNOSIS — Z7901 Long term (current) use of anticoagulants: Secondary | ICD-10-CM | POA: Insufficient documentation

## 2019-06-12 DIAGNOSIS — Z981 Arthrodesis status: Secondary | ICD-10-CM | POA: Diagnosis not present

## 2019-06-12 HISTORY — DX: Cardiac arrhythmia, unspecified: I49.9

## 2019-06-12 HISTORY — PX: LUMBAR LAMINECTOMY/DECOMPRESSION MICRODISCECTOMY: SHX5026

## 2019-06-12 HISTORY — DX: Personal history of urinary calculi: Z87.442

## 2019-06-12 HISTORY — DX: Constipation, unspecified: K59.00

## 2019-06-12 SURGERY — LUMBAR LAMINECTOMY/DECOMPRESSION MICRODISCECTOMY 1 LEVEL
Anesthesia: General | Site: Back

## 2019-06-12 MED ORDER — ACETAMINOPHEN 650 MG RE SUPP: 650.0000 mg | RECTAL | Status: DC | PRN

## 2019-06-12 MED ORDER — PHENOL 1.4 % MT LIQD: 1.0000 | OROMUCOSAL | Status: DC | PRN

## 2019-06-12 MED ORDER — CHLORHEXIDINE GLUCONATE CLOTH 2 % EX PADS: 6.0000 | MEDICATED_PAD | Freq: Once | CUTANEOUS | Status: DC

## 2019-06-12 MED ORDER — SODIUM CHLORIDE 0.9 % IV SOLN
INTRAVENOUS | Status: DC | PRN
  Administered 2019-06-12: 08:00:00 500 mL

## 2019-06-12 MED ORDER — METOPROLOL TARTRATE 25 MG PO TABS
50.0000 mg | ORAL_TABLET | Freq: Two times a day (BID) | ORAL | Status: DC
  Administered 2019-06-12: 50 mg via ORAL
  Filled 2019-06-12: qty 2

## 2019-06-12 MED ORDER — THROMBIN 5000 UNITS EX SOLR
CUTANEOUS | Status: DC | PRN
  Administered 2019-06-12 (×2): 5000 [IU] via TOPICAL

## 2019-06-12 MED ORDER — BUPIVACAINE HCL (PF) 0.25 % IJ SOLN
INTRAMUSCULAR | Status: AC
  Filled 2019-06-12: qty 30

## 2019-06-12 MED ORDER — PROPOFOL 10 MG/ML IV BOLUS
INTRAVENOUS | Status: AC
  Filled 2019-06-12: qty 20

## 2019-06-12 MED ORDER — LIDOCAINE 2% (20 MG/ML) 5 ML SYRINGE
INTRAMUSCULAR | Status: AC
  Filled 2019-06-12: qty 5

## 2019-06-12 MED ORDER — ROCURONIUM BROMIDE 10 MG/ML (PF) SYRINGE
PREFILLED_SYRINGE | INTRAVENOUS | Status: DC | PRN
  Administered 2019-06-12: 20 mg via INTRAVENOUS
  Administered 2019-06-12: 60 mg via INTRAVENOUS

## 2019-06-12 MED ORDER — PROPAFENONE HCL ER 225 MG PO CP12
225.0000 mg | ORAL_CAPSULE | Freq: Two times a day (BID) | ORAL | Status: DC
  Administered 2019-06-12: 225 mg via ORAL
  Filled 2019-06-12 (×3): qty 1

## 2019-06-12 MED ORDER — CEFAZOLIN SODIUM-DEXTROSE 2-4 GM/100ML-% IV SOLN
2.0000 g | Freq: Three times a day (TID) | INTRAVENOUS | Status: AC
  Administered 2019-06-12 (×2): 2 g via INTRAVENOUS
  Filled 2019-06-12 (×2): qty 100

## 2019-06-12 MED ORDER — ONDANSETRON HCL 4 MG/2ML IJ SOLN
INTRAMUSCULAR | Status: DC | PRN
  Administered 2019-06-12: 4 mg via INTRAVENOUS

## 2019-06-12 MED ORDER — SENNA 8.6 MG PO TABS
1.0000 | ORAL_TABLET | Freq: Two times a day (BID) | ORAL | Status: DC
  Administered 2019-06-12: 8.6 mg via ORAL
  Filled 2019-06-12: qty 1

## 2019-06-12 MED ORDER — SODIUM CHLORIDE 0.9% FLUSH
3.0000 mL | Freq: Two times a day (BID) | INTRAVENOUS | Status: DC
  Administered 2019-06-12 (×2): 3 mL via INTRAVENOUS

## 2019-06-12 MED ORDER — DORZOLAMIDE HCL 2 % OP SOLN
1.0000 [drp] | Freq: Two times a day (BID) | OPHTHALMIC | Status: DC
  Administered 2019-06-12: 1 [drp] via OPHTHALMIC
  Filled 2019-06-12: qty 10

## 2019-06-12 MED ORDER — DEXAMETHASONE SODIUM PHOSPHATE 10 MG/ML IJ SOLN
10.0000 mg | INTRAMUSCULAR | Status: DC
  Filled 2019-06-12: qty 1

## 2019-06-12 MED ORDER — THROMBIN 5000 UNITS EX SOLR
OROMUCOSAL | Status: DC | PRN
  Administered 2019-06-12: 08:00:00 5 mL via TOPICAL

## 2019-06-12 MED ORDER — HYDROCHLOROTHIAZIDE 25 MG PO TABS
25.0000 mg | ORAL_TABLET | Freq: Every day | ORAL | Status: DC
  Administered 2019-06-12: 25 mg via ORAL
  Filled 2019-06-12: qty 1

## 2019-06-12 MED ORDER — BUPIVACAINE HCL (PF) 0.25 % IJ SOLN
INTRAMUSCULAR | Status: DC | PRN
  Administered 2019-06-12: 8 mL

## 2019-06-12 MED ORDER — HEMOSTATIC AGENTS (NO CHARGE) OPTIME
TOPICAL | Status: DC | PRN
  Administered 2019-06-12: 1

## 2019-06-12 MED ORDER — DEXAMETHASONE SODIUM PHOSPHATE 10 MG/ML IJ SOLN
INTRAMUSCULAR | Status: DC | PRN
  Administered 2019-06-12: 4 mg via INTRAVENOUS

## 2019-06-12 MED ORDER — ROCURONIUM BROMIDE 10 MG/ML (PF) SYRINGE
PREFILLED_SYRINGE | INTRAVENOUS | Status: AC
  Filled 2019-06-12: qty 10

## 2019-06-12 MED ORDER — FENTANYL CITRATE (PF) 250 MCG/5ML IJ SOLN
INTRAMUSCULAR | Status: DC | PRN
  Administered 2019-06-12: 50 ug via INTRAVENOUS
  Administered 2019-06-12 (×3): 25 ug via INTRAVENOUS

## 2019-06-12 MED ORDER — FENTANYL CITRATE (PF) 100 MCG/2ML IJ SOLN
INTRAMUSCULAR | Status: AC
  Filled 2019-06-12: qty 2

## 2019-06-12 MED ORDER — ESMOLOL HCL 100 MG/10ML IV SOLN
INTRAVENOUS | Status: AC
  Filled 2019-06-12: qty 10

## 2019-06-12 MED ORDER — LIDOCAINE 2% (20 MG/ML) 5 ML SYRINGE
INTRAMUSCULAR | Status: DC | PRN
  Administered 2019-06-12: 80 mg via INTRAVENOUS

## 2019-06-12 MED ORDER — DEXAMETHASONE 4 MG PO TABS
4.0000 mg | ORAL_TABLET | Freq: Four times a day (QID) | ORAL | Status: DC
  Administered 2019-06-12 – 2019-06-13 (×3): 4 mg via ORAL
  Filled 2019-06-12 (×3): qty 1

## 2019-06-12 MED ORDER — DEXAMETHASONE SODIUM PHOSPHATE 4 MG/ML IJ SOLN
4.0000 mg | Freq: Four times a day (QID) | INTRAMUSCULAR | Status: DC
  Administered 2019-06-12: 4 mg via INTRAVENOUS
  Filled 2019-06-12: qty 1

## 2019-06-12 MED ORDER — ALENDRONATE SODIUM 70 MG PO TABS: 70.0000 mg | ORAL_TABLET | ORAL | Status: DC

## 2019-06-12 MED ORDER — ONDANSETRON HCL 4 MG/2ML IJ SOLN: 4.0000 mg | Freq: Four times a day (QID) | INTRAMUSCULAR | Status: DC | PRN

## 2019-06-12 MED ORDER — LATANOPROST 0.005 % OP SOLN
1.0000 [drp] | Freq: Every day | OPHTHALMIC | Status: DC
  Administered 2019-06-12: 1 [drp] via OPHTHALMIC
  Filled 2019-06-12: qty 2.5

## 2019-06-12 MED ORDER — LACTATED RINGERS IV SOLN
INTRAVENOUS | Status: DC | PRN
  Administered 2019-06-12: 08:00:00 via INTRAVENOUS

## 2019-06-12 MED ORDER — HYDROCODONE-ACETAMINOPHEN 7.5-325 MG PO TABS
1.0000 | ORAL_TABLET | Freq: Four times a day (QID) | ORAL | Status: DC
  Administered 2019-06-12 – 2019-06-13 (×5): 1 via ORAL
  Filled 2019-06-12 (×5): qty 1

## 2019-06-12 MED ORDER — MORPHINE SULFATE (PF) 2 MG/ML IV SOLN: 1.0000 mg | INTRAVENOUS | Status: DC | PRN

## 2019-06-12 MED ORDER — IRBESARTAN 75 MG PO TABS
37.5000 mg | ORAL_TABLET | Freq: Every day | ORAL | Status: DC
  Administered 2019-06-12: 37.5 mg via ORAL
  Filled 2019-06-12 (×2): qty 0.5

## 2019-06-12 MED ORDER — ACETAMINOPHEN 10 MG/ML IV SOLN
INTRAVENOUS | Status: AC
  Filled 2019-06-12: qty 100

## 2019-06-12 MED ORDER — SUGAMMADEX SODIUM 200 MG/2ML IV SOLN
INTRAVENOUS | Status: DC | PRN
  Administered 2019-06-12: 180 mg via INTRAVENOUS

## 2019-06-12 MED ORDER — ACETAMINOPHEN 325 MG PO TABS: 650.0000 mg | ORAL_TABLET | ORAL | Status: DC | PRN

## 2019-06-12 MED ORDER — MENTHOL 3 MG MT LOZG
1.0000 | LOZENGE | OROMUCOSAL | Status: DC | PRN
  Filled 2019-06-12: qty 9

## 2019-06-12 MED ORDER — DEXAMETHASONE SODIUM PHOSPHATE 10 MG/ML IJ SOLN
INTRAMUSCULAR | Status: AC
  Filled 2019-06-12: qty 1

## 2019-06-12 MED ORDER — SODIUM CHLORIDE 0.9 % IV SOLN
250.0000 mL | INTRAVENOUS | Status: DC
  Administered 2019-06-12: 250 mL via INTRAVENOUS

## 2019-06-12 MED ORDER — FENTANYL CITRATE (PF) 100 MCG/2ML IJ SOLN
25.0000 ug | INTRAMUSCULAR | Status: DC | PRN
  Administered 2019-06-12 (×2): 25 ug via INTRAVENOUS

## 2019-06-12 MED ORDER — ONDANSETRON HCL 4 MG PO TABS: 4.0000 mg | ORAL_TABLET | Freq: Four times a day (QID) | ORAL | Status: DC | PRN

## 2019-06-12 MED ORDER — CEFAZOLIN SODIUM-DEXTROSE 2-4 GM/100ML-% IV SOLN
2.0000 g | INTRAVENOUS | Status: AC
  Administered 2019-06-12: 2 g via INTRAVENOUS
  Filled 2019-06-12: qty 100

## 2019-06-12 MED ORDER — SODIUM CHLORIDE 0.9% FLUSH: 3.0000 mL | INTRAVENOUS | Status: DC | PRN

## 2019-06-12 MED ORDER — FENTANYL CITRATE (PF) 250 MCG/5ML IJ SOLN
INTRAMUSCULAR | Status: AC
  Filled 2019-06-12: qty 5

## 2019-06-12 MED ORDER — 0.9 % SODIUM CHLORIDE (POUR BTL) OPTIME
TOPICAL | Status: DC | PRN
  Administered 2019-06-12: 1000 mL

## 2019-06-12 MED ORDER — TRAMADOL HCL 50 MG PO TABS: 50.0000 mg | ORAL_TABLET | Freq: Four times a day (QID) | ORAL | Status: DC | PRN

## 2019-06-12 MED ORDER — EZETIMIBE 10 MG PO TABS
10.0000 mg | ORAL_TABLET | Freq: Every day | ORAL | Status: DC
  Administered 2019-06-12: 10 mg via ORAL
  Filled 2019-06-12 (×2): qty 1

## 2019-06-12 MED ORDER — ESMOLOL HCL 100 MG/10ML IV SOLN
INTRAVENOUS | Status: DC | PRN
  Administered 2019-06-12: 40 mg via INTRAVENOUS
  Administered 2019-06-12: 30 mg via INTRAVENOUS

## 2019-06-12 MED ORDER — TRAZODONE HCL 50 MG PO TABS
50.0000 mg | ORAL_TABLET | Freq: Every day | ORAL | Status: DC
  Administered 2019-06-12: 50 mg via ORAL
  Filled 2019-06-12 (×2): qty 1

## 2019-06-12 MED ORDER — POTASSIUM CHLORIDE IN NACL 20-0.9 MEQ/L-% IV SOLN: INTRAVENOUS | Status: DC

## 2019-06-12 MED ORDER — ONDANSETRON HCL 4 MG/2ML IJ SOLN
INTRAMUSCULAR | Status: AC
  Filled 2019-06-12: qty 2

## 2019-06-12 MED ORDER — PROPOFOL 10 MG/ML IV BOLUS
INTRAVENOUS | Status: DC | PRN
  Administered 2019-06-12: 150 mg via INTRAVENOUS

## 2019-06-12 MED ORDER — THROMBIN 5000 UNITS EX SOLR
CUTANEOUS | Status: AC
  Filled 2019-06-12: qty 15000

## 2019-06-12 SURGICAL SUPPLY — 55 items
ADH SKN CLS APL DERMABOND .7 (GAUZE/BANDAGES/DRESSINGS) ×1
APL SKNCLS STERI-STRIP NONHPOA (GAUZE/BANDAGES/DRESSINGS) ×1
BAG DECANTER FOR FLEXI CONT (MISCELLANEOUS) ×2 IMPLANT
BENZOIN TINCTURE PRP APPL 2/3 (GAUZE/BANDAGES/DRESSINGS) ×2 IMPLANT
BUR MATCHSTICK NEURO 3.0 LAGG (BURR) ×2 IMPLANT
CANISTER SUCT 3000ML PPV (MISCELLANEOUS) ×2 IMPLANT
CARTRIDGE OIL MAESTRO DRILL (MISCELLANEOUS) ×1 IMPLANT
CLSR STERI-STRIP ANTIMIC 1/2X4 (GAUZE/BANDAGES/DRESSINGS) ×1 IMPLANT
COVER WAND RF STERILE (DRAPES) ×2 IMPLANT
DERMABOND ADVANCED (GAUZE/BANDAGES/DRESSINGS) ×1
DERMABOND ADVANCED .7 DNX12 (GAUZE/BANDAGES/DRESSINGS) IMPLANT
DIFFUSER DRILL AIR PNEUMATIC (MISCELLANEOUS) ×2 IMPLANT
DRAPE LAPAROTOMY 100X72X124 (DRAPES) ×2 IMPLANT
DRAPE MICROSCOPE LEICA (MISCELLANEOUS) ×2 IMPLANT
DRAPE POUCH INSTRU U-SHP 10X18 (DRAPES) ×2 IMPLANT
DRAPE SURG 17X23 STRL (DRAPES) ×2 IMPLANT
DRSG OPSITE POSTOP 3X4 (GAUZE/BANDAGES/DRESSINGS) ×1 IMPLANT
DRSG OPSITE POSTOP 4X6 (GAUZE/BANDAGES/DRESSINGS) ×1 IMPLANT
DURAPREP 26ML APPLICATOR (WOUND CARE) ×2 IMPLANT
ELECT REM PT RETURN 9FT ADLT (ELECTROSURGICAL) ×2
ELECTRODE REM PT RTRN 9FT ADLT (ELECTROSURGICAL) ×1 IMPLANT
GAUZE 4X4 16PLY RFD (DISPOSABLE) IMPLANT
GLOVE BIO SURGEON STRL SZ7 (GLOVE) ×1 IMPLANT
GLOVE BIO SURGEON STRL SZ8 (GLOVE) ×2 IMPLANT
GLOVE BIOGEL PI IND STRL 7.0 (GLOVE) IMPLANT
GLOVE BIOGEL PI IND STRL 7.5 (GLOVE) IMPLANT
GLOVE BIOGEL PI INDICATOR 7.0 (GLOVE) ×2
GLOVE BIOGEL PI INDICATOR 7.5 (GLOVE) ×1
GLOVE SURG SS PI 7.0 STRL IVOR (GLOVE) ×4 IMPLANT
GOWN STRL REUS W/ TWL LRG LVL3 (GOWN DISPOSABLE) IMPLANT
GOWN STRL REUS W/ TWL XL LVL3 (GOWN DISPOSABLE) ×1 IMPLANT
GOWN STRL REUS W/TWL 2XL LVL3 (GOWN DISPOSABLE) IMPLANT
GOWN STRL REUS W/TWL LRG LVL3 (GOWN DISPOSABLE) ×4
GOWN STRL REUS W/TWL XL LVL3 (GOWN DISPOSABLE) ×2
HEMOSTAT POWDER KIT SURGIFOAM (HEMOSTASIS) ×1 IMPLANT
KIT BASIN OR (CUSTOM PROCEDURE TRAY) ×2 IMPLANT
KIT TURNOVER KIT B (KITS) ×2 IMPLANT
NDL HYPO 25X1 1.5 SAFETY (NEEDLE) ×1 IMPLANT
NDL SPNL 20GX3.5 QUINCKE YW (NEEDLE) IMPLANT
NEEDLE HYPO 25X1 1.5 SAFETY (NEEDLE) ×2 IMPLANT
NEEDLE SPNL 20GX3.5 QUINCKE YW (NEEDLE) ×2 IMPLANT
NS IRRIG 1000ML POUR BTL (IV SOLUTION) ×2 IMPLANT
OIL CARTRIDGE MAESTRO DRILL (MISCELLANEOUS) ×2
PACK LAMINECTOMY NEURO (CUSTOM PROCEDURE TRAY) ×2 IMPLANT
PAD ARMBOARD 7.5X6 YLW CONV (MISCELLANEOUS) ×6 IMPLANT
RUBBERBAND STERILE (MISCELLANEOUS) ×4 IMPLANT
SPONGE SURGIFOAM ABS GEL SZ50 (HEMOSTASIS) ×1 IMPLANT
STRIP CLOSURE SKIN 1/2X4 (GAUZE/BANDAGES/DRESSINGS) ×2 IMPLANT
SUT VIC AB 0 CT1 18XCR BRD8 (SUTURE) ×1 IMPLANT
SUT VIC AB 0 CT1 8-18 (SUTURE) ×2
SUT VIC AB 2-0 CP2 18 (SUTURE) ×2 IMPLANT
SUT VIC AB 3-0 SH 8-18 (SUTURE) ×2 IMPLANT
TOWEL GREEN STERILE (TOWEL DISPOSABLE) ×2 IMPLANT
TOWEL GREEN STERILE FF (TOWEL DISPOSABLE) ×2 IMPLANT
WATER STERILE IRR 1000ML POUR (IV SOLUTION) ×2 IMPLANT

## 2019-06-12 NOTE — Plan of Care (Signed)
PATIENT ADEQUATELY PROGRESSING WELL

## 2019-06-12 NOTE — Anesthesia Procedure Notes (Signed)
Procedure Name: Intubation Performed by: Milford Cage, CRNA Pre-anesthesia Checklist: Patient identified, Emergency Drugs available, Suction available and Patient being monitored Patient Re-evaluated:Patient Re-evaluated prior to induction Oxygen Delivery Method: Circle System Utilized Preoxygenation: Pre-oxygenation with 100% oxygen Induction Type: IV induction Ventilation: Mask ventilation without difficulty Laryngoscope Size: Mac and 3 Grade View: Grade III Tube type: Oral Tube size: 7.0 mm Number of attempts: 1 Airway Equipment and Method: Stylet and Oral airway Placement Confirmation: ETT inserted through vocal cords under direct vision,  positive ETCO2 and breath sounds checked- equal and bilateral Secured at: 23 cm Tube secured with: Tape Dental Injury: Teeth and Oropharynx as per pre-operative assessment

## 2019-06-12 NOTE — H&P (Signed)
Subjective: Patient is a 85 y.o. female admitted for stenosis. Onset of symptoms was several months ago, gradually worsening since that time.  The pain is rated severe, and is located at the across the lower back and radiates to RLE. The pain is described as aching and occurs all day. The symptoms have been progressive. Symptoms are exacerbated by exercise. MRI or CT showed severe stenosis   Past Medical History:  Diagnosis Date  . Allergic rhinitis due to pollen   . Anxiety state, unspecified    Sitautional- pain  . Arthritis    "right knee" (06/21/2016)  . Asymptomatic varicose veins   . Carpal tunnel syndrome   . Chest pain, unspecified   . Chronic lower back pain    "on the left side" (06/21/2016)  . Constipation   . Cramp of limb   . Depressive disorder, not elsewhere classified   . Diaphragmatic hernia without mention of obstruction or gangrene   . Diaphragmatic hernia without mention of obstruction or gangrene   . Disturbance of skin sensation   . Diverticulosis of colon (without mention of hemorrhage)   . Dysrhythmia    Atrial Flutter  . Enthesopathy of hip region   . Esophageal reflux    , just occasional  . History of blood transfusion    "w/both knee replacements"  . History of duodenal ulcer   . History of kidney stones    passed  . Insomnia, unspecified   . Lumbago   . Migraine    "none since ~ 1990" (06/21/2016)  . Myalgia and myositis, unspecified   . Obesity, unspecified   . Other abnormal blood chemistry   . Other dysphagia   . Other malaise and fatigue   . Other nonspecific abnormal serum enzyme levels   . Other specified cardiac dysrhythmias(427.89)   . Pacemaker   . Pain in joint, ankle and foot   . Pain in joint, hand   . Pain in joint, lower leg   . Pain in joint, pelvic region and thigh   . Pain in limb   . Plantar fascial fibromatosis   . Presence of permanent cardiac pacemaker    -St Jude  . Reflux esophagitis   . Sciatica   . Spinal  stenosis, unspecified region other than cervical   . Stricture and stenosis of esophagus   . Symptomatic menopausal or female climacteric states   . Unspecified essential hypertension   . Unspecified essential hypertension   . Unspecified vitamin D deficiency     Past Surgical History:  Procedure Laterality Date  . BREAST BIOPSY Left 1990s X 2  . CARDIOVERSION N/A 04/26/2016   Procedure: CARDIOVERSION;  Surgeon: Pixie Casino, MD;  Location: [facility];  Service: Cardiovascular;  Laterality: N/A;  . CATARACT EXTRACTION W/ INTRAOCULAR LENS IMPLANT Left 08/03/1999   DR EPES   . CATARACT EXTRACTION W/ INTRAOCULAR LENS IMPLANT Right 2000   DR EPES  . [place]  . COLONOSCOPY  1988   Normal   . DENTAL SURGERY Left 08/2016  . EP IMPLANTABLE DEVICE N/A 06/21/2016   Procedure: Pacemaker Implant;  Surgeon: Evans Lance, MD;  Location: [facility];  Service: Cardiovascular;  Laterality: N/A;  . ESOPHAGOGASTRODUODENOSCOPY (EGD) WITH ESOPHAGEAL DILATION  ~ 1982   Dr. Sharlett Iles  . EXCISION OF ACTINIC KERATOSIS     DR LUPTON   . EYE SURGERY    . INSERT / REPLACE / REMOVE PACEMAKER    .  JOINT REPLACEMENT    . KNEE ARTHROSCOPY Left 2003  . KNEE ARTHROSCOPY Right 06-26-13  . KNEE CLOSED REDUCTION Right 10/23/2013   Procedure: CLOSED MANIPULATION RIGHT KNEE;  Surgeon: Mcarthur Rossetti, MD;  Location: [facility];  Service: Orthopedics;  Laterality: Right;  . LASER FOR CLOUDY CAP LEFT EYE Left 03/2006   DR DIGBY  . TOTAL KNEE ARTHROPLASTY Left 04/2004   DR RENDALL  . TOTAL KNEE ARTHROPLASTY Right 07/04/2013   Procedure: RIGHT TOTAL KNEE ARTHROPLASTY;  Surgeon: Mcarthur Rossetti, MD;  Location: [facility];  Service: Orthopedics;  Laterality: Right;  . TRIGGER FINGER RELEASE Right 09/2018  . VAGINAL HYSTERECTOMY  1979    Prior to Admission medications   Medication Sig Start Date End Date Taking? Authorizing Provider  acetaminophen (TYLENOL) 500 MG tablet  Take 500-1,000 mg by mouth every 6 (six) hours as needed for mild pain.    Yes [provider]  alendronate (FOSAMAX) 70 MG tablet Take 70 mg by mouth every Saturday. Take with a full glass of water on an empty stomach.    Yes [provider]  ALPRAZolam Duanne Moron) 0.25 MG tablet Take one tablet by mouth at bedtime as needed for rest or anxiety Patient taking differently: Take 0.125 mg by mouth daily as needed (leg burning).  04/02/19  Yes Lauree Chandler, NP  antiseptic oral rinse (BIOTENE) LIQD 15 mLs by Mouth Rinse route daily.    Yes [provider]  apixaban (ELIQUIS) 5 MG TABS tablet Take 1 tablet (5 mg total) by mouth 2 (two) times daily. 03/17/19  Yes Martinique, Peter M, MD  bimatoprost (LUMIGAN) 0.01 % SOLN Place 1 drop into both eyes at bedtime.    Yes [provider]  Calcium Carb-Cholecalciferol (CALTRATE 600+D3) 600-800 MG-UNIT TABS 1 tablet twice daily Patient taking differently: Take 1 tablet by mouth daily.  01/28/19  Yes Lauree Chandler, NP  Calcium Carbonate Antacid ([employer] ANTACID SOFT CHEWS ULTR ST) 1177 MG CHEW Chew 1,177 mg by mouth 3 (three) times daily as needed (acid reflux/indigestion.).   Yes [provider]  Cholecalciferol (VITAMIN D) 2000 UNITS tablet Take 2,000 Units by mouth daily.   Yes [provider]  Dentifrices (BIOTENE DRY MOUTH) PSTE Place 1 application onto teeth 2 (two) times a day.   Yes [provider]  dorzolamide (TRUSOPT) 2 % ophthalmic solution Place 1 drop into both eyes 2 (two) times daily.    Yes [provider]  ezetimibe (ZETIA) 10 MG tablet TAKE 1 TABLET ONCE DAILY. Patient taking differently: Take 10 mg by mouth daily.  01/13/19  Yes Lauree Chandler, NP  fluticasone (FLONASE) 50 MCG/ACT nasal spray Place 1 spray into both nostrils daily. Patient taking differently: Place 1 spray into both nostrils daily as needed (allergies.).  01/28/19  Yes Lauree Chandler, NP   hydrochlorothiazide (HYDRODIURIL) 25 MG tablet TAKE 1 TABLET BY MOUTH EVERY DAY 03/18/19  Yes Lauree Chandler, NP  methylcellulose (ARTIFICIAL TEARS) 1 % ophthalmic solution Place 1 drop into both eyes daily as needed (for dry eyes).    Yes [provider]  metoprolol tartrate (LOPRESSOR) 50 MG tablet TAKE 1 TABLET BY MOUTH TWICE A DAY Patient taking differently: Take 50 mg by mouth 2 (two) times a day.  10/24/18  Yes Martinique, Peter M, MD  propafenone (RYTHMOL SR) 225 MG 12 hr capsule TAKE 1 CAPSULE (225 MG TOTAL) BY MOUTH 2 (TWO) TIMES DAILY. 05/20/19  Yes Martinique, Peter M, MD  traMADol (ULTRAM) 50 MG tablet Take 1-2 tablets (50-100 mg total) by mouth every 6 (six) hours as needed. Patient taking differently: Take 25 mg by mouth every 6 (six) hours as needed (pain.).  05/20/19  Yes Mcarthur Rossetti, MD  traZODone (DESYREL) 50 MG tablet TAKE 1 TABLET BY MOUTH EVERY NIGHT TO HELP WITH SLEEP 06/09/19  Yes Lauree Chandler, NP  Trolamine Salicylate (ASPERCREME EX) Apply 1 application topically at bedtime as needed (leg pain.).   Yes [provider]  valsartan (DIOVAN) 320 MG tablet TAKE 1 TABLET BY MOUTH EVERY DAY Patient taking differently: Take 320 mg by mouth daily.  03/18/19  Yes Lauree Chandler, NP  Artificial Saliva (ACT DRY MOUTH) LOZG Use as directed 1 drop in the mouth or throat as needed (dry mouth).    [provider]  esomeprazole (NEXIUM) 20 MG capsule TAKE 1 CAPSULE BY MOUTH EVERY DAY Patient not taking: Reported on 06/04/2019 04/25/19   Lauree Chandler, NP  methylPREDNISolone (MEDROL) 4 MG tablet Take as directed Patient not taking: Reported on 04/02/2019 02/17/19   Pete Pelt, PA-C  nystatin (MYCOSTATIN/NYSTOP) powder Apply topically 3 (three) times daily. Patient not taking: Reported on 05/06/2019 03/20/19   Lauree Chandler, NP  nystatin cream (MYCOSTATIN) Apply 1 application topically 2 (two) times daily. Patient not taking: Reported on  06/04/2019 03/20/19   Lauree Chandler, NP   Allergies  Allergen Reactions  . Advil [Ibuprofen]     GI upset  . Aleve [Naproxen Sodium]     GI upset  . Aspirin Nausea And Vomiting    Takes baby aspirin qod without problems  . Atorvastatin     Leg cramps  . Celebrex [Celecoxib]     GI upset  . Diclofenac     GI upset  . Doxycycline     Headache  . Fosamax [Alendronate Sodium]     GI discomfort   . Metoclopramide Hcl     REACTION: heart palps  . Nsaids Other (See Comments)    Tears my stomach up   . Other     Antihistamine - increase BP  . Pravastatin     Leg cramps  . Timolol     Dropped HR    Social History   Tobacco Use  . Smoking status: Never Smoker  . Smokeless tobacco: Never Used  Substance Use Topics  . Alcohol use: No    Family History  Problem Relation Age of Onset  . Ovarian cancer Mother   . Uterine cancer Mother   . Cerebrovascular Accident Mother   . Heart disease Father   . Hypertension Brother   . Obesity Daughter      Review of Systems  Positive ROS: neg  All other systems have been reviewed and were otherwise negative with the exception of those mentioned in the HPI and as above.  Objective: Vital signs in last 24 hours: Temp:  [98.6 F (37 C)] 98.6 F (37 C) (06/18 0556) Pulse Rate:  [62] 62 (06/18 0551) Resp:  [16] 16 (06/18 0551) BP: (160)/(84) 160/84 (06/18 0556) SpO2:  [100 %] 100 % (06/18 0551) Weight:  [83 kg] 83 kg (06/18 3244)  General Appearance: Alert, cooperative, no distress, appears stated age Head: Normocephalic, without obvious abnormality, atraumatic Eyes: PERRL, conjunctiva/corneas clear, EOM's intact    Neck: Supple, symmetrical, trachea midline Back: Symmetric, no curvature, ROM normal, no CVA tenderness Lungs:  respirations unlabored Heart: Regular rate and rhythm Abdomen: Soft, non-tender Extremities:  Extremities normal, atraumatic, no cyanosis or edema Pulses: 2+ and symmetric all extremities Skin:  Skin color, texture, turgor normal, no rashes or lesions  NEUROLOGIC:   Mental status: Alert and oriented x4,  no aphasia, good attention span, fund of knowledge, and memory Motor Exam - grossly normal Sensory Exam - grossly normal Reflexes: trace Coordination - grossly normal Gait - grossly normal Balance - grossly normal Cranial Nerves: I: smell Not tested  II: visual acuity  OS: nl    OD: nl  II: visual fields Full to confrontation  II: pupils Equal, round, reactive to light  III,VII: ptosis None  III,IV,VI: extraocular muscles  Full ROM  V: mastication Normal  V: facial light touch sensation  Normal  V,VII: corneal reflex  Present  VII: facial muscle function - upper  Normal  VII: facial muscle function - lower Normal  VIII: hearing Not tested  IX: soft palate elevation  Normal  IX,X: gag reflex Present  XI: trapezius strength  5/5  XI: sternocleidomastoid strength 5/5  XI: neck flexion strength  5/5  XII: tongue strength  Normal    Data Review Lab Results  Component Value Date   WBC 7.5 06/09/2019   HGB 13.3 06/09/2019   HCT 41.1 06/09/2019   MCV 95.4 06/09/2019   PLT 186 06/09/2019   Lab Results  Component Value Date   NA 138 06/09/2019   K 3.7 06/09/2019   CL 101 06/09/2019   CO2 28 06/09/2019   BUN 11 06/09/2019   CREATININE 0.70 06/09/2019   GLUCOSE 107 (H) 06/09/2019   Lab Results  Component Value Date   INR 1.1 06/09/2019    Assessment/Plan:  Estimated body mass index is 33.47 kg/m as calculated from the following:   Height as of this encounter: 5\' 2"  (1.575 m).   Weight as of this encounter: 83 kg. Patient admitted for laminectomy for stenosis. Patient has failed a reasonable attempt at conservative therapy.  I explained the condition and procedure to the patient and answered any questions.  Patient wishes to proceed with procedure as planned. Understands risks/ benefits and typical outcomes of procedure.   Eustace Moore 06/12/2019 7:21  AM

## 2019-06-12 NOTE — Transfer of Care (Signed)
Immediate Anesthesia Transfer of Care Note  Patient: [name]  Procedure(s) Performed: Laminectomy and Foraminotomy - Lumbar four-Lumbar five (N/A Back)  Patient Location: PACU  Anesthesia Type:General  Level of Consciousness: awake  Airway & Oxygen Therapy: Patient Spontanous Breathing and Patient connected to nasal cannula oxygen  Post-op Assessment: Report given to RN and Post -op Vital signs reviewed and stable  Post vital signs: Reviewed and stable  Last Vitals:  Vitals Value Taken Time  BP 180/79 06/12/19 0932  Temp    Pulse 60 06/12/19 0933  Resp 12 06/12/19 0933  SpO2 100 % 06/12/19 0933  Vitals shown include unvalidated device data.  Last Pain:  Vitals:   06/12/19 0633  PainSc: 4       Patients Stated Pain Goal: 2 (09/16/29 0762)  Complications: No apparent anesthesia complications

## 2019-06-12 NOTE — Anesthesia Preprocedure Evaluation (Signed)
Anesthesia Evaluation  Patient identified by MRN, date of birth, ID band Patient awake    Reviewed: Allergy & Precautions, NPO status   Airway Mallampati: II  TM Distance: >3 FB     Dental   Pulmonary    breath sounds clear to auscultation       Cardiovascular hypertension, + dysrhythmias + pacemaker  Rhythm:Regular Rate:Normal     Neuro/Psych    GI/Hepatic Neg liver ROS, GERD  ,  Endo/Other  negative endocrine ROS  Renal/GU negative Renal ROS     Musculoskeletal   Abdominal   Peds  Hematology   Anesthesia Other Findings   Reproductive/Obstetrics                             Anesthesia Physical Anesthesia Plan  ASA: III  Anesthesia Plan: General   Post-op Pain Management:    Induction: Intravenous  PONV Risk Score and Plan: 3 and Ondansetron, Dexamethasone and Midazolam  Airway Management Planned:   Additional Equipment:   Intra-op Plan:   Post-operative Plan: Possible Post-op intubation/ventilation  Informed Consent: I have reviewed the patients History and Physical, chart, labs and discussed the procedure including the risks, benefits and alternatives for the proposed anesthesia with the patient or authorized representative who has indicated his/her understanding and acceptance.     Dental advisory given  Plan Discussed with: CRNA and Anesthesiologist  Anesthesia Plan Comments:         Anesthesia Quick Evaluation

## 2019-06-12 NOTE — Op Note (Signed)
06/12/2019  9:24 AM  PATIENT:  [name]  85 y.o. female  PRE-OPERATIVE DIAGNOSIS: Severe spinal stenosis with spondylolisthesis L4-5 with back and right leg pain  POST-OPERATIVE DIAGNOSIS:  same  PROCEDURE: Decompressive lumbar laminectomy medial facetectomy foraminotomy L4-5  SURGEON:  Sherley Bounds, MD  ASSISTANTS: Glenford Peers, FNP  ANESTHESIA:   General  EBL: 50 ml  Total I/O In: -  Out: 150 [Blood:150]  BLOOD ADMINISTERED: none  DRAINS: None  SPECIMEN:  none  INDICATION FOR PROCEDURE: This patient presented with severe right leg pain. Imaging showed spondylolisthesis with stenosis L4-5. The patient tried conservative measures without relief. Pain was debilitating. Recommended decompressive laminectomy. Patient understood the risks, benefits, and alternatives and potential outcomes and wished to proceed.  PROCEDURE DETAILS: The patient was taken to the operating room and after induction of adequate generalized endotracheal anesthesia, the patient was rolled into the prone position on the Wilson frame and all pressure points were padded. The lumbar region was cleaned and then prepped with DuraPrep and draped in the usual sterile fashion. 5 cc of local anesthesia was injected and then a dorsal midline incision was made and carried down to the lumbo sacral fascia. The fascia was opened and the paraspinous musculature was taken down in a subperiosteal fashion to expose L4-5 bilaterally.  I originally planned on the right L4-5 hemilaminectomy but given to her severe spondylosis I felt this would be very difficult decompression from a hemilaminectomy approach.  For remove the spinous process and turned this into a laminectomy.  Intraoperative x-ray confirmed my level, and then I used a combination of the high-speed drill and the Kerrison punches to perform a laminectomy, medial facetectomy, and foraminotomy at L4-5 bilaterally. The underlying yellow ligament was opened and removed  in a piecemeal fashion to expose the underlying dura and exiting nerve root.  There is very severe spinal stenosis with severe compression of the thecal sac from each side.  I undercut the lateral recess and dissected down until I was medial to and distal to the pedicle paying particular attention to the right side because of her right sided pain. The nerve root was well decompressed. We then gently retracted the nerve root medially with a retractor, coagulated the epidural venous vasculature, and inspected the disc space.  I then palpated with a coronary dilator along the nerve root and into the foramen to assure adequate decompression. I felt no more compression of the nerve root. I irrigated with saline solution containing bacitracin. Achieved hemostasis with bipolar cautery, lined the dura with Gelfoam, and then closed the fascia with 0 Vicryl. I closed the subcutaneous tissues with 2-0 Vicryl and the subcuticular tissues with 3-0 Vicryl. The skin was then closed with benzoin and Steri-Strips. The drapes were removed, a sterile dressing was applied. The patient was awakened from general anesthesia and transferred to the recovery room in stable condition. At the end of the procedure all sponge, needle and instrument counts were correct.    PLAN OF CARE: Admit for overnight observation  PATIENT DISPOSITION:  PACU - hemodynamically stable.   Delay start of Pharmacological VTE agent (>24hrs) due to surgical blood loss or risk of bleeding:  yes

## 2019-06-12 NOTE — Evaluation (Signed)
Physical Therapy Evaluation Patient Details Name: [name] MRN: [id number] DOB: 1931/01/21 Today's Date: 06/12/2019   History of Present Illness  Patient is a 85 y.o. female admitted for stenosis.  Underwent Decompressive lumbar laminectomy medial facetectomy foraminotomy L4-5.  PMH of HTN, pacemaker  Clinical Impression  Pt admitted with above diagnosis. Pt currently with functional limitations due to the deficits listed below (see PT Problem List). Pt was able to ambulate with supervision with RW.  Steady with the RW.  Verbalizes precautions and needed a few cues for following precautions at all times.  Pt did not want to practice steps today so will do so in am.   Pt will benefit from skilled PT to increase their independence and safety with mobility to allow discharge to the venue listed below.      Follow Up Recommendations Home health PT;Supervision/Assistance - 24 hour    Equipment Recommendations  None recommended by PT    Recommendations for Other Services       Precautions / Restrictions Precautions Precautions: Fall;Back Precaution Booklet Issued: Yes (comment) Restrictions Weight Bearing Restrictions: No      Mobility  Bed Mobility               General bed mobility comments: Pt was in chair on arrival  Transfers Overall transfer level: Needs assistance Equipment used: Rolling walker (2 wheeled) Transfers: Sit to/from Stand Sit to Stand: Supervision         General transfer comment: Cues for hand placement  Ambulation/Gait Ambulation/Gait assistance: Supervision Gait Distance (Feet): 250 Feet Assistive device: Rolling walker (2 wheeled) Gait Pattern/deviations: Step-through pattern;Decreased stride length   Gait velocity interpretation: <1.31 ft/sec, indicative of household ambulator General Gait Details: Pt safe wtih RW and walked a little without it but appears more steady and confident with the RW.  Will use intiailly per pt.  Pt did well  overall.   Stairs            Wheelchair Mobility    Modified Rankin (Stroke Patients Only)       Balance Overall balance assessment: Needs assistance Sitting-balance support: No upper extremity supported;Feet supported Sitting balance-Leahy Scale: Fair     Standing balance support: Bilateral upper extremity supported;During functional activity Standing balance-Leahy Scale: Fair Standing balance comment: can stand statically without RW support                             Pertinent Vitals/Pain Pain Assessment: Faces Faces Pain Scale: Hurts little more Pain Location: back Pain Descriptors / Indicators: Grimacing;Discomfort Pain Intervention(s): Limited activity within patient's tolerance;Monitored during session;Repositioned;Patient requesting pain meds-RN notified    Home Living Family/patient expects to be discharged to:: Private residence Living Arrangements: Spouse/significant other Available Help at Discharge: Family;Available 24 hours/day Type of Home: House Home Access: Stairs to enter Entrance Stairs-Rails: Right;Left;Can reach both Entrance Stairs-Number of Steps: 4 Home Layout: Two level;Bed/bath upstairs;Full bath on main level Home Equipment: Walker - 2 wheels;Bedside commode;Shower seat;Cane - single point;Adaptive equipment(has a RW upstairs and downstairs)      Prior Function Level of Independence: Needs assistance   Gait / Transfers Assistance Needed: Ambulated wtih RW  ADL's / Homemaking Assistance Needed: Assist with socks        Hand Dominance   Dominant Hand: Right    Extremity/Trunk Assessment   Upper Extremity Assessment Upper Extremity Assessment: Defer to OT evaluation    Lower Extremity Assessment Lower Extremity Assessment: Generalized  weakness    Cervical / Trunk Assessment Cervical / Trunk Assessment: Normal  Communication   Communication: No difficulties  Cognition Arousal/Alertness: Awake/alert Behavior  During Therapy: WFL for tasks assessed/performed Overall Cognitive Status: Within Functional Limits for tasks assessed                                        General Comments General comments (skin integrity, edema, etc.): Discussed back precautions with pt and she understands.     Exercises     Assessment/Plan    PT Assessment Patient needs continued PT services  PT Problem List Decreased mobility;Decreased knowledge of use of DME;Decreased safety awareness;Decreased knowledge of precautions;Pain       PT Treatment Interventions Stair training;Gait training;DME instruction;Functional mobility training;Therapeutic activities;Therapeutic exercise;Balance training;Patient/family education    PT Goals (Current goals can be found in the Care Plan section)  Acute Rehab PT Goals Patient Stated Goal: to go home PT Goal Formulation: With patient Time For Goal Achievement: 06/26/19 Potential to Achieve Goals: Good    Frequency Min 5X/week   Barriers to discharge        Co-evaluation               AM-PAC PT "6 Clicks" Mobility  Outcome Measure Help needed turning from your back to your side while in a flat bed without using bedrails?: A Little Help needed moving from lying on your back to sitting on the side of a flat bed without using bedrails?: A Little Help needed moving to and from a bed to a chair (including a wheelchair)?: A Little Help needed standing up from a chair using your arms (e.g., wheelchair or bedside chair)?: A Little Help needed to walk in hospital room?: A Little Help needed climbing 3-5 steps with a railing? : A Little 6 Click Score: 18    End of Session Equipment Utilized During Treatment: Gait belt Activity Tolerance: Patient limited by fatigue Patient left: with call bell/phone within reach;in chair Nurse Communication: Mobility status PT Visit Diagnosis: Muscle weakness (generalized) (M62.81)    Time: 1610-9604 PT Time  Calculation (min) (ACUTE ONLY): 33 min   Charges:   PT Evaluation $PT Eval Moderate Complexity: 1 Mod PT Treatments $Gait Training: 8-22 mins        [place] Pager:  [phone]  Office:  [place] 06/12/2019, 1:52 PM

## 2019-06-13 ENCOUNTER — Encounter (HOSPITAL_COMMUNITY): Payer: Self-pay | Admitting: Neurological Surgery

## 2019-06-13 DIAGNOSIS — Z96653 Presence of artificial knee joint, bilateral: Secondary | ICD-10-CM | POA: Diagnosis not present

## 2019-06-13 DIAGNOSIS — Z7983 Long term (current) use of bisphosphonates: Secondary | ICD-10-CM | POA: Diagnosis not present

## 2019-06-13 DIAGNOSIS — G4700 Insomnia, unspecified: Secondary | ICD-10-CM | POA: Diagnosis not present

## 2019-06-13 DIAGNOSIS — M4316 Spondylolisthesis, lumbar region: Secondary | ICD-10-CM | POA: Diagnosis not present

## 2019-06-13 DIAGNOSIS — I10 Essential (primary) hypertension: Secondary | ICD-10-CM | POA: Diagnosis not present

## 2019-06-13 DIAGNOSIS — Z79899 Other long term (current) drug therapy: Secondary | ICD-10-CM | POA: Diagnosis not present

## 2019-06-13 DIAGNOSIS — E559 Vitamin D deficiency, unspecified: Secondary | ICD-10-CM | POA: Diagnosis not present

## 2019-06-13 DIAGNOSIS — M48061 Spinal stenosis, lumbar region without neurogenic claudication: Secondary | ICD-10-CM | POA: Diagnosis not present

## 2019-06-13 DIAGNOSIS — Z7901 Long term (current) use of anticoagulants: Secondary | ICD-10-CM | POA: Diagnosis not present

## 2019-06-13 DIAGNOSIS — Z950 Presence of cardiac pacemaker: Secondary | ICD-10-CM | POA: Diagnosis not present

## 2019-06-13 MED ORDER — HYDROCODONE-ACETAMINOPHEN 7.5-325 MG PO TABS: 1.0000 | ORAL_TABLET | Freq: Four times a day (QID) | ORAL | 0 refills | Status: DC

## 2019-06-13 NOTE — Evaluation (Signed)
Occupational Therapy Evaluation and Discharge Patient Details Name: [name] MRN: [id number] DOB: November 08, 1931 Today's Date: 06/13/2019    History of Present Illness Patient is a 85 y.o. female admitted for stenosis.  Underwent Decompressive lumbar laminectomy medial facetectomy foraminotomy L4-5.  PMH of HTN, pacemaker   Clinical Impression   All education and recommendations for AE provided with pt verbalizing or demonstrating understanding. Pt plans to rely on her husband to assist with socks should she decide to wear them. Husband also will complete IADL. No further OT needs.    Follow Up Recommendations  No OT follow up    Equipment Recommendations  None recommended by OT    Recommendations for Other Services       Precautions / Restrictions Precautions Precautions: Fall;Back Precaution Booklet Issued: Yes (comment) Precaution Comments: reviewed back precautions verbally Restrictions Weight Bearing Restrictions: No      Mobility Bed Mobility               General bed mobility comments: pt walking in hall   Transfers Overall transfer level: Modified independent Equipment used: Rolling walker (2 wheeled)             General transfer comment: increased time, good technique    Balance Overall balance assessment: Needs assistance   Sitting balance-Leahy Scale: Fair     Standing balance support: During functional activity Standing balance-Leahy Scale: Good                             ADL either performed or assessed with clinical judgement   ADL Overall ADL's : Needs assistance/impaired Eating/Feeding: Independent;Sitting   Grooming: Oral care;Wash/dry hands;Standing;Set up Grooming Details (indicate cue type and reason): educated in 2 cup method and use of washcloth for face Upper Body Bathing: Sitting;Minimal assistance Upper Body Bathing Details (indicate cue type and reason): recommended long handled bath sponge Lower Body  Bathing: Minimal assistance;Sit to/from stand Lower Body Bathing Details (indicate cue type and reason): recommended long handled bath sponge and use of reacher Upper Body Dressing : Set up;Standing   Lower Body Dressing: Set up;Sit to/from stand Lower Body Dressing Details (indicate cue type and reason): slip on shoes Toilet Transfer: Modified Independent;Ambulation;RW   Toileting- Clothing Manipulation and Hygiene: Modified independent;Sit to/from stand Toileting - Clothing Manipulation Details (indicate cue type and reason): recommended tongs for pericare and wet wipes   Tub/Shower Transfer Details (indicate cue type and reason): educated in technique with RW Functional mobility during ADLs: Modified independent;Rolling walker       Vision Baseline Vision/History: Wears glasses Patient Visual Report: No change from baseline       Perception     Praxis      Pertinent Vitals/Pain Pain Assessment: Faces Faces Pain Scale: Hurts a little bit Pain Location: back Pain Descriptors / Indicators: Sore Pain Intervention(s): Monitored during session;Repositioned     Hand Dominance Right   Extremity/Trunk Assessment Upper Extremity Assessment Upper Extremity Assessment: Overall WFL for tasks assessed   Lower Extremity Assessment Lower Extremity Assessment: Defer to PT evaluation   Cervical / Trunk Assessment Cervical / Trunk Assessment: Normal   Communication Communication Communication: No difficulties   Cognition Arousal/Alertness: Awake/alert Behavior During Therapy: WFL for tasks assessed/performed Overall Cognitive Status: Within Functional Limits for tasks assessed  General Comments       Exercises     Shoulder Instructions      Home Living Family/patient expects to be discharged to:: Private residence Living Arrangements: Spouse/significant other Available Help at Discharge: Family;Available 24  hours/day Type of Home: House Home Access: Stairs to enter [employer] of Steps: 4 Entrance Stairs-Rails: Right;Left;Can reach both Home Layout: Two level;Bed/bath upstairs;Full bath on main level Alternate Level Stairs-Number of Steps: 4 Alternate Level Stairs-Rails: Right;Left;Can reach both Bathroom Shower/Tub: Occupational psychologist: Handicapped height     Home Equipment: Environmental consultant - 2 wheels;Bedside commode;Shower seat;Cane - single point;Adaptive equipment Adaptive Equipment: Reacher        Prior Functioning/Environment Level of Independence: Needs assistance  Gait / Transfers Assistance Needed: Ambulated wtih RW ADL's / Homemaking Assistance Needed: Assist with socks, plans to wear slip on shoes            OT Problem List:        OT Treatment/Interventions:      OT Goals(Current goals can be found in the care plan section) Acute Rehab OT Goals Patient Stated Goal: to go home  OT Frequency:     Barriers to D/C:            Co-evaluation              AM-PAC OT "6 Clicks" Daily Activity     Outcome Measure Help from another person eating meals?: None Help from another person taking care of personal grooming?: None Help from another person toileting, which includes using toliet, bedpan, or urinal?: None Help from another person bathing (including washing, rinsing, drying)?: A Little Help from another person to put on and taking off regular upper body clothing?: None Help from another person to put on and taking off regular lower body clothing?: A Little 6 Click Score: 22   End of Session Equipment Utilized During Treatment: Gait belt;Rolling walker  Activity Tolerance: Patient tolerated treatment well Patient left: in chair;with call bell/phone within reach  OT Visit Diagnosis: Other abnormalities of gait and mobility (R26.89)                Time: 4142-3953 OT Time Calculation (min): 30 min Charges:  OT General Charges $OT  Visit: 1 Visit OT Evaluation $OT Eval Low Complexity: 1 Low OT Treatments $Self Care/Home Management : 8-22 mins  Nestor Lewandowsky, OTR/L Acute Rehabilitation Services Pager: [phone] Office: [phone]  Malka So 06/13/2019, 9:56 AM

## 2019-06-13 NOTE — Progress Notes (Addendum)
Physical Therapy Treatment and Discharge Patient Details Name: [name] MRN: [id number] DOB: 12-21-1931 Today's Date: 06/13/2019    History of Present Illness Patient is a 85 y.o. female admitted for stenosis.  Underwent Decompressive lumbar laminectomy medial facetectomy foraminotomy L4-5.  PMH of HTN, pacemaker    PT Comments    Pt very pleasant and excited about her functional improvement s/p surgery. She is progressing well with post-op mobility and is able to intermittently ambulate around the room without an AD. Pt was educated on precautions, car transfer, safe activity progression, and stair negotiation. Pt anticipates d/c home today.     Follow Up Recommendations  Supervision/Assistance - 24 hour;No PT follow up     Equipment Recommendations  None recommended by PT    Recommendations for Other Services       Precautions / Restrictions Precautions Precautions: Fall;Back Precaution Booklet Issued: Yes (comment) Precaution Comments: reviewed back precautions verbally Restrictions Weight Bearing Restrictions: No    Mobility  Bed Mobility               General bed mobility comments: Pt received sitting up in recliner chair.   Transfers Overall transfer level: Modified independent Equipment used: Rolling walker (2 wheeled) Transfers: Sit to/from Stand           General transfer comment: increased time, good technique  Ambulation/Gait Ambulation/Gait assistance: Modified independent (Device/Increase time) Gait Distance (Feet): 350 Feet Assistive device: Rolling walker (2 wheeled) Gait Pattern/deviations: Step-through pattern;Decreased stride length Gait velocity: Decreased Gait velocity interpretation: 1.31 - 2.62 ft/sec, indicative of limited community ambulator General Gait Details: Pt safe wtih RW and walked a little without it but appears more steady and confident with the RW.  Will use intiailly per pt.  Pt did well overall.     Stairs Stairs: Yes Stairs assistance: Min guard Stair Management: Two rails;Step to pattern;Forwards Number of Stairs: 5 General stair comments: VC's for sequencing and general safety.    Wheelchair Mobility    Modified Rankin (Stroke Patients Only)       Balance Overall balance assessment: Needs assistance Sitting-balance support: No upper extremity supported;Feet supported Sitting balance-Leahy Scale: Fair     Standing balance support: During functional activity Standing balance-Leahy Scale: Good Standing balance comment: can stand statically without RW support                            Cognition Arousal/Alertness: Awake/alert Behavior During Therapy: WFL for tasks assessed/performed Overall Cognitive Status: Within Functional Limits for tasks assessed                                        Exercises      General Comments        Pertinent Vitals/Pain Pain Assessment: Faces Faces Pain Scale: Hurts a little bit Pain Location: back Pain Descriptors / Indicators: Sore Pain Intervention(s): Monitored during session    Home Living Family/patient expects to be discharged to:: Private residence Living Arrangements: Spouse/significant other Available Help at Discharge: Family;Available 24 hours/day Type of Home: House Home Access: Stairs to enter Entrance Stairs-Rails: Right;Left;Can reach both Home Layout: Two level;Bed/bath upstairs;Full bath on main level Home Equipment: Walker - 2 wheels;Bedside commode;Shower seat;Cane - single point;Adaptive equipment      Prior Function Level of Independence: Needs assistance  Gait / Transfers Assistance Needed: Ambulated wtih RW ADL's /  Homemaking Assistance Needed: Assist with socks, plans to wear slip on shoes     PT Goals (current goals can now be found in the care plan section) Acute Rehab PT Goals Patient Stated Goal: to go home PT Goal Formulation: With patient Time For Goal  Achievement: 06/26/19 Potential to Achieve Goals: Good    Frequency    Min 5X/week      PT Plan Current plan remains appropriate    Co-evaluation              AM-PAC PT "6 Clicks" Mobility   Outcome Measure  Help needed turning from your back to your side while in a flat bed without using bedrails?: A Little Help needed moving from lying on your back to sitting on the side of a flat bed without using bedrails?: A Little Help needed moving to and from a bed to a chair (including a wheelchair)?: A Little Help needed standing up from a chair using your arms (e.g., wheelchair or bedside chair)?: A Little Help needed to walk in hospital room?: A Little Help needed climbing 3-5 steps with a railing? : A Little 6 Click Score: 18    End of Session Equipment Utilized During Treatment: Gait belt Activity Tolerance: Patient limited by fatigue Patient left: with call bell/phone within reach;in chair Nurse Communication: Mobility status PT Visit Diagnosis: Muscle weakness (generalized) (M62.81)     Time: 6811-5726 PT Time Calculation (min) (ACUTE ONLY): 26 min  Charges:  $Gait Training: 23-37 mins                     Rolinda Roan, PT, DPT Acute Rehabilitation Services Pager: [phone] Office: [phone]    Thelma Comp 06/13/2019, 11:40 AM

## 2019-06-13 NOTE — Progress Notes (Signed)
Patient alert and oriented, mae's well, voiding adequate amount of urine, swallowing without difficulty, no c/o pain at time of discharge. Patient discharged home with family. Script and discharged instructions given to patient. Patient and family stated understanding of instructions given. Patient has an appointment with Dr. jones   

## 2019-06-13 NOTE — Discharge Summary (Signed)
Physician Discharge Summary  Patient ID: [name] MRN: [id number] DOB/AGE: October 26, 1931 85 y.o.  Admit date: 06/12/2019 Discharge date: 06/13/2019  Admission Diagnoses: Severe spinal stenosis with spondylolisthesis L4-5 with back and right leg pain  Discharge Diagnoses: same   Discharged Condition: good  Hospital Course: The patient was admitted on 06/12/2019 and taken to the operating room where the patient underwent lumbar lami L4-L5. The patient tolerated the procedure well and was taken to the recovery room and then to the floor in stable condition. The hospital course was routine. There were no complications. The wound remained clean dry and intact. Pt had appropriate back soreness. No complaints of leg pain or new N/T/W. The patient remained afebrile with stable vital signs, and tolerated a regular diet. The patient continued to increase activities, and pain was well controlled with oral pain medications.   Consults: None  Significant Diagnostic Studies:  Results for orders placed or performed during the hospital encounter of 06/12/19  Surgical pcr screen   Specimen: Nasal Mucosa; Nasal Swab  Result Value Ref Range   MRSA, PCR NEGATIVE NEGATIVE   Staphylococcus aureus NEGATIVE NEGATIVE  Basic metabolic panel  Result Value Ref Range   Sodium 138 135 - 145 mmol/L   Potassium 3.7 3.5 - 5.1 mmol/L   Chloride 101 98 - 111 mmol/L   CO2 28 22 - 32 mmol/L   Glucose, Bld 107 (H) 70 - 99 mg/dL   BUN 11 8 - 23 mg/dL   Creatinine, Ser 0.70 0.44 - 1.00 mg/dL   Calcium 9.0 8.9 - 10.3 mg/dL   GFR calc non Af Amer >60 >60 mL/min   GFR calc Af Amer >60 >60 mL/min   Anion gap 9 5 - 15  CBC WITH DIFFERENTIAL  Result Value Ref Range   WBC 7.5 4.0 - 10.5 K/uL   RBC 4.31 3.87 - 5.11 MIL/uL   Hemoglobin 13.3 12.0 - 15.0 g/dL   HCT 41.1 36.0 - 46.0 %   MCV 95.4 80.0 - 100.0 fL   MCH 30.9 26.0 - 34.0 pg   MCHC 32.4 30.0 - 36.0 g/dL   RDW 12.6 11.5 - 15.5 %   Platelets 186 150 - 400 K/uL    nRBC 0.0 0.0 - 0.2 %   Neutrophils Relative % 59 %   Neutro Abs 4.5 1.7 - 7.7 K/uL   Lymphocytes Relative 31 %   Lymphs Abs 2.3 0.7 - 4.0 K/uL   Monocytes Relative 7 %   Monocytes Absolute 0.5 0.1 - 1.0 K/uL   Eosinophils Relative 2 %   Eosinophils Absolute 0.1 0.0 - 0.5 K/uL   Basophils Relative 1 %   Basophils Absolute 0.1 0.0 - 0.1 K/uL   Immature Granulocytes 0 %   Abs Immature Granulocytes 0.02 0.00 - 0.07 K/uL  Protime-INR  Result Value Ref Range   Prothrombin Time 14.3 11.4 - 15.2 seconds   INR 1.1 0.8 - 1.2    Chest 2 View  Result Date: 06/09/2019 CLINICAL DATA:  85 year old female with a history of laminectomy EXAM: CHEST - 2 VIEW COMPARISON:  06/24/2017 FINDINGS: Unchanged appearance of the cardiomediastinal silhouette. Unchanged cardiac pacing device on left chest wall. No pneumothorax. No pleural effusion. Stigmata of emphysema, with increased retrosternal airspace, flattened hemidiaphragms, increased AP diameter, and hyperinflation on the AP view. No confluent airspace disease. Coarsened interstitial markings. No displaced fracture.  Degenerative changes of the spine. IMPRESSION: Chronic lung changes and emphysema without evidence of acute cardiopulmonary disease. Electronically Signed  By: Corrie Mckusick D.O.   On: 06/09/2019 16:11   Dg Lumbar Spine 2-3 Views  Result Date: 06/12/2019 CLINICAL DATA:  Lumbar localization for surgery. EXAM: LUMBAR SPINE - 2-3 VIEW COMPARISON:  None. FINDINGS: The same lumbar numbering system will be utilized as on 05/28/2019 CT with transitional lumbosacral anatomy. 3 intraoperative LATERAL views of the lumbar spine are submitted postoperatively for interpretation. Films demonstrate placement of posterior localizers, with the final film demonstrating a posterior metallic probe directed at the L5-lumbarized S1 interspace. IMPRESSION: Personal assistant Signed   By: Margarette Canada M.D.   On: 06/12/2019 10:21   Ct Lumbar Spine W  Contrast  Result Date: 05/28/2019 CLINICAL DATA:  Chronic right-sided buttock pain radiating down the back of the right leg to the foot. EXAM: LUMBAR MYELOGRAM CT LUMBAR MYELOGRAM FLUOROSCOPY TIME:  Radiation Exposure Index (as provided by the fluoroscopic device): 24.9 mGy Fluoroscopy Time:  38 seconds Number of Acquired Images: PROCEDURE: After thorough discussion of risks and benefits of the procedure including bleeding, infection, injury to nerves, blood vessels, adjacent structures as well as headache and CSF leak, written and oral informed consent was obtained. Consent was obtained by Dr. Fabiola Backer. Time out form was completed. Patient was positioned prone on the fluoroscopy table. Local anesthesia was provided with 1% lidocaine without epinephrine after prepped and draped in the usual sterile fashion. Puncture was performed at S1-S2 using a 5 inch 22-gauge spinal needle via left paramedian approach. Using a single pass through the dura, the needle was placed within the thecal sac, with return of clear CSF. 15 mL of Isovue M-200 was injected into the thecal sac, with normal opacification of the nerve roots and cauda equina consistent with free flow within the subarachnoid space. I personally performed the lumbar puncture and administered the intrathecal contrast. I also personally supervised acquisition of the myelogram images. TECHNIQUE: Contiguous axial images were obtained through the lumbar spine after the intrathecal infusion of contrast. Coronal and sagittal reconstructions were obtained of the axial image sets. COMPARISON:  None. FINDINGS: LUMBAR MYELOGRAM FINDINGS: Twelve rib-bearing thoracic vertebral bodies on prior chest x-rays. Therefore, there are six lumbar type vertebral bodies, with a fully lumbarized S1 vertebral body. The last well-formed disc space is labeled S1-S2. 4 mm anterolisthesis at L5-S1 increases to 6 mm with standing, flexion, and extension. Small ventral extradural defect at  L3-L4 with right lateral recess effacement. Large ventral extradural defect at L5-S1 with severe spinal canal stenosis. CT LUMBAR MYELOGRAM FINDINGS: Segmentation: 6 lumbar type vertebral bodies, with a fully lumbarized S1 vertebral body. The last well-formed disc space is labeled S1-S2 for the purposes of the study. Alignment: Slight levoscoliosis.  4 mm anterolisthesis at L5-S1. Vertebrae: No acute fracture or other focal pathologic process. Conus medullaris and cauda equina: Conus extends to the L1 level. Conus and cauda equina appear normal. Paraspinal and other soft tissues: Aortoiliac atherosclerotic vascular disease. Prior cholecystectomy. Disc levels: T12-L1:  Minimal disc bulging.  No stenosis. L1-L2:  Mild disc bulging.  No stenosis. L2-L3:  Minimal disc bulging.  No stenosis. L3-L4: Right eccentric disc bulging with right-sided disc osteophyte complex. Mild bilateral facet arthropathy. Moderate right lateral recess stenosis. Mild spinal canal and right neuroforaminal stenosis. No left neuroforaminal stenosis. L4-L5: Minimal disc bulging. Moderate left and mild right facet arthropathy. Mild spinal canal stenosis. No neuroforaminal stenosis. L5-S1: Disc uncovering and disc bulging with severe bilateral facet arthropathy. Severe spinal canal and lateral recess stenosis. Mild left neuroforaminal stenosis with disc encroaching  on the exiting left L5 nerve root. No right neuroforaminal stenosis. S1-S2: Negative disc. Severe bilateral facet arthropathy. No stenosis. IMPRESSION: 1. 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 Correlation with radiographs is recommended prior to any operative intervention. 2. Severe spinal canal stenosis at L5-S1. Grade 1 anterolisthesis at this level mildly worsens with standing. 3. Mild spinal canal stenosis at L3-L4 and L4-L5. Moderate right lateral recess stenosis at L3-L4 could affect the descending right L4 nerve root. 4. Aortic atherosclerosis  (ICD10-I70.0). Electronically Signed   By: Titus Dubin M.D.   On: 05/28/2019 12:56   Dg Myelography Lumbar Inj Lumbosacral  Result Date: 05/28/2019 CLINICAL DATA:  Chronic right-sided buttock pain radiating down the back of the right leg to the foot. EXAM: LUMBAR MYELOGRAM CT LUMBAR MYELOGRAM FLUOROSCOPY TIME:  Radiation Exposure Index (as provided by the fluoroscopic device): 24.9 mGy Fluoroscopy Time:  38 seconds Number of Acquired Images: PROCEDURE: After thorough discussion of risks and benefits of the procedure including bleeding, infection, injury to nerves, blood vessels, adjacent structures as well as headache and CSF leak, written and oral informed consent was obtained. Consent was obtained by Dr. Fabiola Backer. Time out form was completed. Patient was positioned prone on the fluoroscopy table. Local anesthesia was provided with 1% lidocaine without epinephrine after prepped and draped in the usual sterile fashion. Puncture was performed at S1-S2 using a 5 inch 22-gauge spinal needle via left paramedian approach. Using a single pass through the dura, the needle was placed within the thecal sac, with return of clear CSF. 15 mL of Isovue M-200 was injected into the thecal sac, with normal opacification of the nerve roots and cauda equina consistent with free flow within the subarachnoid space. I personally performed the lumbar puncture and administered the intrathecal contrast. I also personally supervised acquisition of the myelogram images. TECHNIQUE: Contiguous axial images were obtained through the lumbar spine after the intrathecal infusion of contrast. Coronal and sagittal reconstructions were obtained of the axial image sets. COMPARISON:  None. FINDINGS: LUMBAR MYELOGRAM FINDINGS: Twelve rib-bearing thoracic vertebral bodies on prior chest x-rays. Therefore, there are six lumbar type vertebral bodies, with a fully lumbarized S1 vertebral body. The last well-formed disc space is labeled S1-S2. 4  mm anterolisthesis at L5-S1 increases to 6 mm with standing, flexion, and extension. Small ventral extradural defect at L3-L4 with right lateral recess effacement. Large ventral extradural defect at L5-S1 with severe spinal canal stenosis. CT LUMBAR MYELOGRAM FINDINGS: Segmentation: 6 lumbar type vertebral bodies, with a fully lumbarized S1 vertebral body. The last well-formed disc space is labeled S1-S2 for the purposes of the study. Alignment: Slight levoscoliosis.  4 mm anterolisthesis at L5-S1. Vertebrae: No acute fracture or other focal pathologic process. Conus medullaris and cauda equina: Conus extends to the L1 level. Conus and cauda equina appear normal. Paraspinal and other soft tissues: Aortoiliac atherosclerotic vascular disease. Prior cholecystectomy. Disc levels: T12-L1:  Minimal disc bulging.  No stenosis. L1-L2:  Mild disc bulging.  No stenosis. L2-L3:  Minimal disc bulging.  No stenosis. L3-L4: Right eccentric disc bulging with right-sided disc osteophyte complex. Mild bilateral facet arthropathy. Moderate right lateral recess stenosis. Mild spinal canal and right neuroforaminal stenosis. No left neuroforaminal stenosis. L4-L5: Minimal disc bulging. Moderate left and mild right facet arthropathy. Mild spinal canal stenosis. No neuroforaminal stenosis. L5-S1: Disc uncovering and disc bulging with severe bilateral facet arthropathy. Severe spinal canal and lateral recess stenosis. Mild left neuroforaminal stenosis with disc encroaching on the exiting left  L5 nerve root. No right neuroforaminal stenosis. S1-S2: Negative disc. Severe bilateral facet arthropathy. No stenosis. IMPRESSION: 1. 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 Correlation with radiographs is recommended prior to any operative intervention. 2. Severe spinal canal stenosis at L5-S1. Grade 1 anterolisthesis at this level mildly worsens with standing. 3. Mild spinal canal stenosis at L3-L4 and L4-L5.  Moderate right lateral recess stenosis at L3-L4 could affect the descending right L4 nerve root. 4. Aortic atherosclerosis (ICD10-I70.0). Electronically Signed   By: Titus Dubin M.D.   On: 05/28/2019 12:56    Antibiotics:  Anti-infectives (From admission, onward)   Start     Dose/Rate Route Frequency Ordered Stop   06/12/19 1600  ceFAZolin (ANCEF) IVPB 2g/100 mL premix     2 g 200 mL/hr over 30 Minutes Intravenous Every 8 hours 06/12/19 1057 06/13/19 0020   06/12/19 0734  bacitracin 50,000 Units in sodium chloride 0.9 % 500 mL irrigation  Status:  Discontinued       As needed 06/12/19 0734 06/12/19 0927   06/12/19 0600  ceFAZolin (ANCEF) IVPB 2g/100 mL premix     2 g 200 mL/hr over 30 Minutes Intravenous On call to O.R. 06/12/19 0549 06/12/19 0755      Discharge Exam: Blood pressure (!) 103/46, pulse 65, temperature 97.9 F (36.6 C), temperature source Oral, resp. rate 18, height 5\' 2"  (1.575 m), weight 83 kg, SpO2 94 %. Neurologic: Grossly normal Ambulating and voiding well, incision cdi  Discharge Medications:   Allergies as of 06/13/2019      Reactions   Advil [ibuprofen]    GI upset   Aleve [naproxen Sodium]    GI upset   Aspirin Nausea And Vomiting   Takes baby aspirin qod without problems   Atorvastatin    Leg cramps   Celebrex [celecoxib]    GI upset   Diclofenac    GI upset   Doxycycline    Headache   Fosamax [alendronate Sodium]    GI discomfort    Metoclopramide Hcl    REACTION: heart palps   Nsaids Other (See Comments)   Tears my stomach up   Other    Antihistamine - increase BP   Pravastatin    Leg cramps   Timolol    Dropped HR      Medication List    TAKE these medications   acetaminophen 500 MG tablet Commonly known as: TYLENOL Take 500-1,000 mg by mouth every 6 (six) hours as needed for mild pain.   ACT Dry Mouth Lozg Use as directed 1 drop in the mouth or throat as needed (dry mouth).   alendronate 70 MG tablet Commonly known as:  FOSAMAX Take 70 mg by mouth every Saturday. Take with a full glass of water on an empty stomach.   ALPRAZolam 0.25 MG tablet Commonly known as: XANAX Take one tablet by mouth at bedtime as needed for rest or anxiety What changed:   how much to take  how to take this  when to take this  reasons to take this  additional instructions   antiseptic oral rinse Liqd 15 mLs by Mouth Rinse route daily.   apixaban 5 MG Tabs tablet Commonly known as: Eliquis Take 1 tablet (5 mg total) by mouth 2 (two) times daily.   ASPERCREME EX Apply 1 application topically at bedtime as needed (leg pain.).   bimatoprost 0.01 % Soln Commonly known as: LUMIGAN Place 1 drop into both eyes at bedtime.   Biotene  Dry Mouth Pste Place 1 application onto teeth 2 (two) times a day.   Calcium Carb-Cholecalciferol 600-800 MG-UNIT Tabs Commonly known as: Caltrate 600+D3 1 tablet twice daily What changed:   how much to take  how to take this  when to take this  additional instructions   [employer] Antacid Soft Chews Ultr St 1177 MG Chew Generic drug: Calcium Carbonate Antacid Chew 1,177 mg by mouth 3 (three) times daily as needed (acid reflux/indigestion.).   dorzolamide 2 % ophthalmic solution Commonly known as: TRUSOPT Place 1 drop into both eyes 2 (two) times daily.   esomeprazole 20 MG capsule Commonly known as: NEXIUM TAKE 1 CAPSULE BY MOUTH EVERY DAY   ezetimibe 10 MG tablet Commonly known as: ZETIA TAKE 1 TABLET ONCE DAILY.   fluticasone 50 MCG/ACT nasal spray Commonly known as: FLONASE Place 1 spray into both nostrils daily. What changed:   when to take this  reasons to take this   hydrochlorothiazide 25 MG tablet Commonly known as: HYDRODIURIL TAKE 1 TABLET BY MOUTH EVERY DAY   HYDROcodone-acetaminophen 7.5-325 MG tablet Commonly known as: NORCO Take 1 tablet by mouth every 6 (six) hours.   methylcellulose 1 % ophthalmic solution Commonly known as: ARTIFICIAL  TEARS Place 1 drop into both eyes daily as needed (for dry eyes).   methylPREDNISolone 4 MG tablet Commonly known as: Medrol Take as directed   metoprolol tartrate 50 MG tablet Commonly known as: LOPRESSOR TAKE 1 TABLET BY MOUTH TWICE A DAY What changed: when to take this   nystatin cream Commonly known as: MYCOSTATIN Apply 1 application topically 2 (two) times daily.   nystatin powder Commonly known as: MYCOSTATIN/NYSTOP Apply topically 3 (three) times daily.   propafenone 225 MG 12 hr capsule Commonly known as: RYTHMOL SR TAKE 1 CAPSULE (225 MG TOTAL) BY MOUTH 2 (TWO) TIMES DAILY.   traMADol 50 MG tablet Commonly known as: ULTRAM Take 1-2 tablets (50-100 mg total) by mouth every 6 (six) hours as needed. What changed:   how much to take  reasons to take this   traZODone 50 MG tablet Commonly known as: DESYREL TAKE 1 TABLET BY MOUTH EVERY NIGHT TO HELP WITH SLEEP   valsartan 320 MG tablet Commonly known as: DIOVAN TAKE 1 TABLET BY MOUTH EVERY DAY   Vitamin D 50 MCG (2000 UT) tablet Take 2,000 Units by mouth daily.       Disposition: home   Final Dx: lumbar laminectomy L4-L5  Discharge Instructions     Remove dressing in 72 hours   Complete by: As directed    Call MD for:  difficulty breathing, headache or visual disturbances   Complete by: As directed    Call MD for:  persistant nausea and vomiting   Complete by: As directed    Call MD for:  redness, tenderness, or signs of infection (pain, swelling, redness, odor or green/yellow discharge around incision site)   Complete by: As directed    Call MD for:  severe uncontrolled pain   Complete by: As directed    Call MD for:  temperature >100.4   Complete by: As directed    Diet - low sodium heart healthy   Complete by: As directed    Increase activity slowly   Complete by: As directed          Signed: Ocie Cornfield Gerlene Glassburn 06/13/2019, 7:14 AM

## 2019-06-13 NOTE — Anesthesia Postprocedure Evaluation (Signed)
Anesthesia Post Note  Patient: RHAYA COALE  Procedure(s) Performed: Laminectomy and Foraminotomy - Lumbar four-Lumbar five (N/A Back)     Anesthesia Post Evaluation  Last Vitals:  Vitals:   06/13/19 0453 06/13/19 0738  BP: (!) 103/46 100/74  Pulse: 65 65  Resp: 18 18  Temp: 36.6 C 37 C  SpO2: 94% 97%    Last Pain:  Vitals:   06/13/19 0738  TempSrc: Oral  PainSc:                  Zenda Herskowitz

## 2019-06-18 ENCOUNTER — Other Ambulatory Visit: Payer: Self-pay | Admitting: Nurse Practitioner

## 2019-06-18 DIAGNOSIS — G4700 Insomnia, unspecified: Secondary | ICD-10-CM

## 2019-06-18 NOTE — Telephone Encounter (Signed)
RX request forwarded to Lauree Chandler, NP to review [facility] database and approve if necessary

## 2019-06-19 ENCOUNTER — Other Ambulatory Visit: Payer: Self-pay | Admitting: *Deleted

## 2019-06-19 MED ORDER — VALSARTAN 320 MG PO TABS: 320.0000 mg | ORAL_TABLET | Freq: Every day | ORAL | 3 refills | Status: DC

## 2019-06-23 ENCOUNTER — Ambulatory Visit (INDEPENDENT_AMBULATORY_CARE_PROVIDER_SITE_OTHER): Payer: Medicare Other | Admitting: *Deleted

## 2019-06-23 DIAGNOSIS — R001 Bradycardia, unspecified: Secondary | ICD-10-CM | POA: Diagnosis not present

## 2019-06-23 DIAGNOSIS — I495 Sick sinus syndrome: Secondary | ICD-10-CM

## 2019-06-24 ENCOUNTER — Telehealth: Payer: Self-pay

## 2019-06-24 NOTE — Telephone Encounter (Signed)
Left message for patient to remind of missed remote transmission.  

## 2019-06-24 NOTE — Telephone Encounter (Signed)
Pt called to get help sending a transmission with her home monitor. Transmission received.

## 2019-06-25 LAB — CUP PACEART REMOTE DEVICE CHECK
Battery Remaining Longevity: 123 mo
Battery Remaining Percentage: 95.5 %
Battery Voltage: 2.99 V
Brady Statistic AP VP Percent: 1 %
Brady Statistic AP VS Percent: 98 %
Brady Statistic AS VP Percent: 1 %
Brady Statistic AS VS Percent: 2.2 %
Brady Statistic RA Percent Paced: 97 %
Brady Statistic RV Percent Paced: 1 %
Date Time Interrogation Session: 20200630153429
Implantable Lead Implant Date: 20170628
Implantable Lead Implant Date: 20170628
Implantable Lead Location: 753859
Implantable Lead Location: 753860
Implantable Pulse Generator Implant Date: 20170628
Lead Channel Impedance Value: 540 Ohm
Lead Channel Impedance Value: 540 Ohm
Lead Channel Pacing Threshold Amplitude: 0.5 V
Lead Channel Pacing Threshold Amplitude: 1 V
Lead Channel Pacing Threshold Pulse Width: 0.4 ms
Lead Channel Pacing Threshold Pulse Width: 0.4 ms
Lead Channel Sensing Intrinsic Amplitude: 1.7 mV
Lead Channel Sensing Intrinsic Amplitude: 8.2 mV
Lead Channel Setting Pacing Amplitude: 2 V
Lead Channel Setting Pacing Amplitude: 2.5 V
Lead Channel Setting Pacing Pulse Width: 0.4 ms
Lead Channel Setting Sensing Sensitivity: 2 mV
Pulse Gen Model: 2272
Pulse Gen Serial Number: 7910313

## 2019-06-26 ENCOUNTER — Telehealth: Payer: Self-pay

## 2019-06-26 NOTE — Telephone Encounter (Signed)
Called patient no answer.Left message on personal voice mail I will call her back to schedule a Sept appointment as soon as the rest of Dr.Jordan's Sept schedule opens.Advised to call sooner if needed.

## 2019-06-30 NOTE — Progress Notes (Signed)
Remote pacemaker transmission.   

## 2019-07-03 DIAGNOSIS — H401131 Primary open-angle glaucoma, bilateral, mild stage: Secondary | ICD-10-CM | POA: Diagnosis not present

## 2019-07-08 DIAGNOSIS — M545 Low back pain: Secondary | ICD-10-CM | POA: Diagnosis not present

## 2019-07-21 ENCOUNTER — Other Ambulatory Visit: Payer: Self-pay | Admitting: *Deleted

## 2019-07-21 MED ORDER — METOPROLOL TARTRATE 50 MG PO TABS: 50.0000 mg | ORAL_TABLET | Freq: Two times a day (BID) | ORAL | 2 refills | Status: DC

## 2019-07-22 ENCOUNTER — Other Ambulatory Visit: Payer: Self-pay | Admitting: Nurse Practitioner

## 2019-08-20 DIAGNOSIS — D485 Neoplasm of uncertain behavior of skin: Secondary | ICD-10-CM | POA: Diagnosis not present

## 2019-08-20 DIAGNOSIS — L309 Dermatitis, unspecified: Secondary | ICD-10-CM | POA: Diagnosis not present

## 2019-08-20 DIAGNOSIS — L570 Actinic keratosis: Secondary | ICD-10-CM | POA: Diagnosis not present

## 2019-08-20 DIAGNOSIS — I8393 Asymptomatic varicose veins of bilateral lower extremities: Secondary | ICD-10-CM | POA: Diagnosis not present

## 2019-09-18 NOTE — Progress Notes (Signed)
Cardiology Office Note   Date:  09/22/2019   ID:  [name], DOB December 17, 1931, MRN [id number]  PCP:  Lauree Chandler, NP  Cardiologist:   Jakeim Sedore [place], MD   Chief Complaint  Patient presents with  . Atrial Fibrillation      History of Present Illness: [name] is a 85 y.o. female is seen for follow up Afib. She has a long history of marked sinus bradycardia and atrial fibrillation/flutter. She was seen initially in 2014. Echo at that time showed mild LAE otherwise normal. Holter showed mean HR 59 with lowest HR 43 and peak HR 109.    On March 9,2017 she noted an increased HR by BP monitor up to 153. At this time she felt marked indigestion from her waist to her neck. She felt her heart fluttering.   She was seen by Dr. Nyoka Cowden on 03/29/16 and found to be in Afib with RVR. She was started on Eliquis and metoprolol. Myoview study and Echo were normal.   She later had attempt at DCCV. She was in an atypical atrial flutter at that time. DCCV resulted in very prolonged pauses > 6 seconds and return to flutter. She was seen by Dr. Curt Bears and placed on flecainide. This did convert her to NSR but made her feel very sick with nausea, dizziness, and extreme fatigue. Flecainide was discontinued.. She continued to have marked bradycardia and underwent PPM placement on 06/21/16. When seen in September 2018 by Dr. Lovena Le she had an Afib burden of 94%. She was started on propafenone for her Afib. Initially this medication caused her to have more nausea but this has improved.  On her pacemaker checks her Afib burden is less than 1% since December 2019.  In June she underwent L4-5 lumbar laminectomy by Dr Ronnald Ramp. This went well. She just notices a little numbness in her leg now.  She reports a few episodes over the last month when she'll be reading in bed and has a mild breathless feeling and her heart feels irregular. This may last a couple of minutes then passes. No chest pain, edema. Weight is  stable.    Past Medical History:  Diagnosis Date  . Allergic rhinitis due to pollen   . Anxiety state, unspecified    Sitautional- pain  . Arthritis    "right knee" (06/21/2016)  . Asymptomatic varicose veins   . Carpal tunnel syndrome   . Chest pain, unspecified   . Chronic lower back pain    "on the left side" (06/21/2016)  . Constipation   . Cramp of limb   . Depressive disorder, not elsewhere classified   . Diaphragmatic hernia without mention of obstruction or gangrene   . Diaphragmatic hernia without mention of obstruction or gangrene   . Disturbance of skin sensation   . Diverticulosis of colon (without mention of hemorrhage)   . Dysrhythmia    Atrial Flutter  . Enthesopathy of hip region   . Esophageal reflux    , just occasional  . History of blood transfusion    "w/both knee replacements"  . History of duodenal ulcer   . History of kidney stones    passed  . Insomnia, unspecified   . Lumbago   . Migraine    "none since ~ 1990" (06/21/2016)  . Myalgia and myositis, unspecified   . Obesity, unspecified   . Other abnormal blood chemistry   . Other dysphagia   . Other malaise and fatigue   .  Other nonspecific abnormal serum enzyme levels   . Other specified cardiac dysrhythmias(427.89)   . Pacemaker   . Pain in joint, ankle and foot   . Pain in joint, hand   . Pain in joint, lower leg   . Pain in joint, pelvic region and thigh   . Pain in limb   . Plantar fascial fibromatosis   . Presence of permanent cardiac pacemaker    -[employer]  . Reflux esophagitis   . Sciatica   . Spinal stenosis, unspecified region other than cervical   . Stricture and stenosis of esophagus   . Symptomatic menopausal or female climacteric states   . Unspecified essential hypertension   . Unspecified essential hypertension   . Unspecified vitamin D deficiency     Past Surgical History:  Procedure Laterality Date  . BREAST BIOPSY Left 1990s X 2  . CARDIOVERSION N/A 04/26/2016    Procedure: CARDIOVERSION;  Surgeon: Pixie Casino, MD;  Location: [facility];  Service: Cardiovascular;  Laterality: N/A;  . CATARACT EXTRACTION W/ INTRAOCULAR LENS IMPLANT Left 08/03/1999   DR EPES   . CATARACT EXTRACTION W/ INTRAOCULAR LENS IMPLANT Right 2000   DR EPES  . [place]  . COLONOSCOPY  1988   Normal   . DENTAL SURGERY Left 08/2016  . EP IMPLANTABLE DEVICE N/A 06/21/2016   Procedure: Pacemaker Implant;  Surgeon: Evans Lance, MD;  Location: [facility];  Service: Cardiovascular;  Laterality: N/A;  . ESOPHAGOGASTRODUODENOSCOPY (EGD) WITH ESOPHAGEAL DILATION  ~ 1982   Dr. Sharlett Iles  . EXCISION OF ACTINIC KERATOSIS     DR LUPTON   . EYE SURGERY    . INSERT / REPLACE / REMOVE PACEMAKER    . JOINT REPLACEMENT    . KNEE ARTHROSCOPY Left 2003  . KNEE ARTHROSCOPY Right 06-26-13  . KNEE CLOSED REDUCTION Right 10/23/2013   Procedure: CLOSED MANIPULATION RIGHT KNEE;  Surgeon: Mcarthur Rossetti, MD;  Location: [facility];  Service: Orthopedics;  Laterality: Right;  . LASER FOR CLOUDY CAP LEFT EYE Left 03/2006   DR DIGBY  . LUMBAR LAMINECTOMY/DECOMPRESSION MICRODISCECTOMY N/A 06/12/2019   Procedure: Laminectomy and Foraminotomy - Lumbar four-Lumbar five;  Surgeon: Eustace Moore, MD;  Location: [facility];  Service: Neurosurgery;  Laterality: N/A;  . TOTAL KNEE ARTHROPLASTY Left 04/2004   DR RENDALL  . TOTAL KNEE ARTHROPLASTY Right 07/04/2013   Procedure: RIGHT TOTAL KNEE ARTHROPLASTY;  Surgeon: Mcarthur Rossetti, MD;  Location: [facility];  Service: Orthopedics;  Laterality: Right;  . TRIGGER FINGER RELEASE Right 09/2018  . VAGINAL HYSTERECTOMY  1979     Current Outpatient Medications  Medication Sig Dispense Refill  . acetaminophen (TYLENOL) 500 MG tablet Take 500-1,000 mg by mouth every 6 (six) hours as needed for mild pain.     Marland Kitchen alendronate (FOSAMAX) 70 MG tablet Take 70 mg by mouth every Saturday. Take with a full glass of water on an  empty stomach.     . ALPRAZolam (XANAX) 0.25 MG tablet Take 0.5 tablets (0.125 mg total) by mouth daily as needed for anxiety. 30 tablet 1  . antiseptic oral rinse (BIOTENE) LIQD 15 mLs by Mouth Rinse route daily.     Marland Kitchen apixaban (ELIQUIS) 5 MG TABS tablet Take 1 tablet (5 mg total) by mouth 2 (two) times daily. 180 tablet 3  . Artificial Saliva (ACT DRY MOUTH) LOZG Use as directed 1 drop in the mouth or throat as needed (dry  mouth).    . bimatoprost (LUMIGAN) 0.01 % SOLN Place 1 drop into both eyes at bedtime.     . Calcium Carb-Cholecalciferol (CALTRATE 600+D3) 600-800 MG-UNIT TABS 1 tablet twice daily (Patient taking differently: Take 1 tablet by mouth daily. ) 60 tablet 0  . Cholecalciferol (VITAMIN D) 2000 UNITS tablet Take 2,000 Units by mouth daily.    . Dentifrices (BIOTENE DRY MOUTH) PSTE Place 1 application onto teeth 2 (two) times a day.    . dorzolamide (TRUSOPT) 2 % ophthalmic solution Place 1 drop into both eyes 2 (two) times daily.     Marland Kitchen ELIQUIS 5 MG TABS tablet     . esomeprazole (NEXIUM) 20 MG capsule TAKE 1 CAPSULE BY MOUTH EVERY DAY (Patient taking differently: as needed. TAKE 1 CAPSULE BY MOUTH EVERY DAY) 30 capsule 2  . fluticasone (FLONASE) 50 MCG/ACT nasal spray Place 1 spray into both nostrils daily. (Patient taking differently: Place 1 spray into both nostrils daily as needed (allergies.). ) 16 g 6  . hydrochlorothiazide (HYDRODIURIL) 25 MG tablet TAKE 1 TABLET BY MOUTH EVERY DAY 90 tablet 0  . methylcellulose (ARTIFICIAL TEARS) 1 % ophthalmic solution Place 1 drop into both eyes daily as needed (for dry eyes).     . metoprolol tartrate (LOPRESSOR) 50 MG tablet Take 1 tablet (50 mg total) by mouth 2 (two) times daily. 180 tablet 2  . nystatin (MYCOSTATIN/NYSTOP) powder Apply topically 3 (three) times daily. 15 g 0  . nystatin cream (MYCOSTATIN) Apply 1 application topically 2 (two) times daily. 30 g 0  . propafenone (RYTHMOL SR) 225 MG 12 hr capsule TAKE 1 CAPSULE (225 MG  TOTAL) BY MOUTH 2 (TWO) TIMES DAILY. 60 capsule 8  . traMADol (ULTRAM) 50 MG tablet Take 1-2 tablets (50-100 mg total) by mouth every 6 (six) hours as needed. (Patient taking differently: Take 25 mg by mouth every 6 (six) hours as needed (pain.). ) 60 tablet 0  . traZODone (DESYREL) 50 MG tablet TAKE 1 TABLET BY MOUTH EVERY NIGHT TO HELP WITH SLEEP (Patient taking differently: 25 mg. TAKE 1 TABLET BY MOUTH EVERY NIGHT TO HELP WITH SLEEP) 30 tablet 0  . Trolamine Salicylate (ASPERCREME EX) Apply 1 application topically at bedtime as needed (leg pain.).    Marland Kitchen valsartan (DIOVAN) 320 MG tablet Take 1 tablet (320 mg total) by mouth daily. 90 tablet 3  . Calcium Carbonate Antacid ([employer] ANTACID SOFT CHEWS ULTR ST) 1177 MG CHEW Chew 1,177 mg by mouth 3 (three) times daily as needed (acid reflux/indigestion.).    Marland Kitchen ezetimibe (ZETIA) 10 MG tablet TAKE 1 TABLET ONCE DAILY. (Patient not taking: Reported on 09/22/2019) 90 tablet 0  . HYDROcodone-acetaminophen (NORCO) 7.5-325 MG tablet Take 1 tablet by mouth every 6 (six) hours. (Patient not taking: Reported on 09/22/2019) 30 tablet 0  . methylPREDNISolone (MEDROL) 4 MG tablet Take as directed (Patient not taking: Reported on 04/02/2019) 21 tablet 0   No current facility-administered medications for this visit.     Allergies:   Advil [ibuprofen], Aleve [naproxen sodium], Aspirin, Atorvastatin, Celebrex [celecoxib], Diclofenac, Doxycycline, Fosamax [alendronate sodium], Metoclopramide hcl, Nsaids, Other, Pravastatin, and Timolol    Social History:  The patient  reports that she has never smoked. She has never used smokeless tobacco. She reports that she does not drink alcohol or use drugs.   Family History:  The patient's family history includes Cerebrovascular Accident in her mother; Heart disease in her father; Hypertension in her brother; Obesity in her daughter; Ovarian  cancer in her mother; Uterine cancer in her mother.    ROS:  Please see the history of  present illness.   Otherwise, review of systems are positive for none.   All other systems are reviewed and negative.    PHYSICAL EXAM: VS:  BP (!) 143/83   Pulse 80   Temp (!) 97 F (36.1 C)   Ht 5\' 3"  (1.6 m)   Wt 183 lb (83 kg)   SpO2 97%   BMI 32.42 kg/m  , BMI Body mass index is 32.42 kg/m. GENERAL:  Well appearing, obese WF in NAD HEENT:  PERRL, EOMI, sclera are clear. Oropharynx is clear. NECK:  No jugular venous distention, carotid upstroke brisk and symmetric, no bruits, no thyromegaly or adenopathy LUNGS:  Clear to auscultation bilaterally CHEST:  Unremarkable HEART:  RRR,  PMI not displaced or sustained,S1 and S2 within normal limits, no S3, no S4: no clicks, no rubs, no murmurs ABD:  Soft, nontender. BS +, no masses or bruits. No hepatomegaly, no splenomegaly EXT:  2 + pulses throughout, no edema, no cyanosis no clubbing SKIN:  Warm and dry.  No rashes NEURO:  Alert and oriented x 3. Cranial nerves II through XII intact. PSYCH:  Cognitively intact    EKG:  EKG is  Not ordered today.   Recent Labs: 03/27/2019: ALT 15 06/09/2019: BUN 11; Creatinine, Ser 0.70; Hemoglobin 13.3; Platelets 186; Potassium 3.7; Sodium 138    Lipid Panel    Component Value Date/Time   CHOL 162 03/27/2019 0901   CHOL 179 03/27/2016 0853   TRIG 233 (H) 03/27/2019 0901   HDL 37 (L) 03/27/2019 0901   HDL 42 03/27/2016 0853   CHOLHDL 4.4 03/27/2019 0901   VLDL 42 (H) 08/06/2017 0918   LDLCALC 93 03/27/2019 0901      Wt Readings from Last 3 Encounters:  09/22/19 183 lb (83 kg)  06/12/19 183 lb (83 kg)  06/09/19 183 lb (83 kg)      Other studies Reviewed: Additional studies/ records that were reviewed today include:  Echo: 09/06/17: Study Conclusions  - Left ventricle: The cavity size was normal. Wall thickness was   increased in a pattern of mild LVH. Systolic function was normal.   The estimated ejection fraction was in the range of 55% to 60%.   Doppler parameters are  consistent with both elevated ventricular   end-diastolic filling pressure and elevated left atrial filling   pressure. - Left atrium: The atrium was moderately dilated. - Atrial septum: There was increased thickness of the septum,   consistent with lipomatous hypertrophy. No defect or patent   foramen ovale was identified  ASSESSMENT AND PLAN:  1.   Atrial fibrillation/flutter with RVR. Unsuccessful DCCV with marked conversion pauses. Intolerant of flecainide.  Now s/p PPM placement. On propafenone with marked improvement in Afib burden. Will continue same. Continue Eliquis for anticoagulation. She did send remote pacer check today. Will see if arrhythmia correlates with her recent symptoms.  2. HTN well controlled.  3. Mild hypercholesterolemia.   Disposition: follow up with me in 6 months.   Signed, Fusae Florio Martinique, [place]  09/22/2019 1:52 PM    [facility] [address] [place], [place], [address] Phone [phone], Fax [phone]

## 2019-09-22 ENCOUNTER — Ambulatory Visit (INDEPENDENT_AMBULATORY_CARE_PROVIDER_SITE_OTHER): Payer: Medicare Other | Admitting: Cardiology

## 2019-09-22 ENCOUNTER — Encounter: Payer: Self-pay | Admitting: Cardiology

## 2019-09-22 ENCOUNTER — Other Ambulatory Visit: Payer: Self-pay

## 2019-09-22 VITALS — BP 143/83 | HR 80 | Temp 97.0°F | Ht 63.0 in | Wt 183.0 lb

## 2019-09-22 DIAGNOSIS — E7800 Pure hypercholesterolemia, unspecified: Secondary | ICD-10-CM | POA: Diagnosis not present

## 2019-09-22 DIAGNOSIS — Z950 Presence of cardiac pacemaker: Secondary | ICD-10-CM | POA: Diagnosis not present

## 2019-09-22 DIAGNOSIS — I495 Sick sinus syndrome: Secondary | ICD-10-CM | POA: Diagnosis not present

## 2019-09-22 DIAGNOSIS — I480 Paroxysmal atrial fibrillation: Secondary | ICD-10-CM | POA: Diagnosis not present

## 2019-09-23 ENCOUNTER — Ambulatory Visit (INDEPENDENT_AMBULATORY_CARE_PROVIDER_SITE_OTHER): Payer: Medicare Other | Admitting: *Deleted

## 2019-09-23 DIAGNOSIS — I480 Paroxysmal atrial fibrillation: Secondary | ICD-10-CM | POA: Diagnosis not present

## 2019-09-24 LAB — CUP PACEART REMOTE DEVICE CHECK
Battery Remaining Longevity: 123 mo
Battery Remaining Percentage: 95.5 %
Battery Voltage: 2.98 V
Brady Statistic AP VP Percent: 1 %
Brady Statistic AP VS Percent: 97 %
Brady Statistic AS VP Percent: 1 %
Brady Statistic AS VS Percent: 2.2 %
Brady Statistic RA Percent Paced: 97 %
Brady Statistic RV Percent Paced: 1 %
Date Time Interrogation Session: 20200930055015
Implantable Lead Implant Date: 20170628
Implantable Lead Implant Date: 20170628
Implantable Lead Location: 753859
Implantable Lead Location: 753860
Implantable Pulse Generator Implant Date: 20170628
Lead Channel Impedance Value: 530 Ohm
Lead Channel Impedance Value: 540 Ohm
Lead Channel Pacing Threshold Amplitude: 0.5 V
Lead Channel Pacing Threshold Amplitude: 1 V
Lead Channel Pacing Threshold Pulse Width: 0.4 ms
Lead Channel Pacing Threshold Pulse Width: 0.4 ms
Lead Channel Sensing Intrinsic Amplitude: 2.2 mV
Lead Channel Sensing Intrinsic Amplitude: 8.8 mV
Lead Channel Setting Pacing Amplitude: 2 V
Lead Channel Setting Pacing Amplitude: 2.5 V
Lead Channel Setting Pacing Pulse Width: 0.4 ms
Lead Channel Setting Sensing Sensitivity: 2 mV
Pulse Gen Model: 2272
Pulse Gen Serial Number: 7910313

## 2019-09-29 ENCOUNTER — Other Ambulatory Visit: Payer: Self-pay

## 2019-09-29 ENCOUNTER — Ambulatory Visit (INDEPENDENT_AMBULATORY_CARE_PROVIDER_SITE_OTHER): Payer: Medicare Other

## 2019-09-29 ENCOUNTER — Other Ambulatory Visit: Payer: Medicare Other

## 2019-09-29 DIAGNOSIS — I10 Essential (primary) hypertension: Secondary | ICD-10-CM

## 2019-09-29 DIAGNOSIS — I480 Paroxysmal atrial fibrillation: Secondary | ICD-10-CM

## 2019-09-29 DIAGNOSIS — Z23 Encounter for immunization: Secondary | ICD-10-CM

## 2019-09-29 DIAGNOSIS — E7800 Pure hypercholesterolemia, unspecified: Secondary | ICD-10-CM

## 2019-09-29 DIAGNOSIS — D485 Neoplasm of uncertain behavior of skin: Secondary | ICD-10-CM | POA: Diagnosis not present

## 2019-09-29 DIAGNOSIS — L218 Other seborrheic dermatitis: Secondary | ICD-10-CM | POA: Diagnosis not present

## 2019-09-29 LAB — COMPLETE METABOLIC PANEL WITH GFR
AG Ratio: 1.5 (calc) (ref 1.0–2.5)
ALT: 20 U/L (ref 6–29)
AST: 18 U/L (ref 10–35)
Albumin: 3.9 g/dL (ref 3.6–5.1)
Alkaline phosphatase (APISO): 29 U/L — ABNORMAL LOW (ref 37–153)
BUN: 18 mg/dL (ref 7–25)
CO2: 31 mmol/L (ref 20–32)
Calcium: 9.1 mg/dL (ref 8.6–10.4)
Chloride: 104 mmol/L (ref 98–110)
Creat: 0.73 mg/dL (ref 0.60–0.88)
GFR, Est African American: 86 mL/min/{1.73_m2} (ref >=60)
GFR, Est Non African American: 75 mL/min/{1.73_m2} (ref >=60)
Globulin: 2.6 g/dL (calc) (ref 1.9–3.7)
Glucose, Bld: 92 mg/dL (ref 65–99)
Potassium: 3.5 mmol/L (ref 3.5–5.3)
Sodium: 140 mmol/L (ref 135–146)
Total Bilirubin: 0.4 mg/dL (ref 0.2–1.2)
Total Protein: 6.5 g/dL (ref 6.1–8.1)

## 2019-09-29 LAB — CBC WITH DIFFERENTIAL/PLATELET
Absolute Monocytes: 454 cells/uL (ref 200–950)
Basophils Absolute: 71 cells/uL (ref 0–200)
Basophils Relative: 1.4 %
Eosinophils Absolute: 230 cells/uL (ref 15–500)
Eosinophils Relative: 4.5 %
HCT: 38.1 % (ref 35.0–45.0)
Hemoglobin: 12.4 g/dL (ref 11.7–15.5)
Lymphs Abs: 1994 cells/uL (ref 850–3900)
MCH: 30.1 pg (ref 27.0–33.0)
MCHC: 32.5 g/dL (ref 32.0–36.0)
MCV: 92.5 fL (ref 80.0–100.0)
MPV: 10.2 fL (ref 7.5–12.5)
Monocytes Relative: 8.9 %
Neutro Abs: 2351 cells/uL (ref 1500–7800)
Neutrophils Relative %: 46.1 %
Platelets: 169 10*3/uL (ref 140–400)
RBC: 4.12 10*6/uL (ref 3.80–5.10)
RDW: 12.9 % (ref 11.0–15.0)
Total Lymphocyte: 39.1 %
WBC: 5.1 10*3/uL (ref 3.8–10.8)

## 2019-09-29 LAB — LIPID PANEL
Cholesterol: 142 mg/dL (ref <200)
HDL: 39 mg/dL — ABNORMAL LOW (ref >=50)
LDL Cholesterol (Calc): 78 mg/dL (calc)
Non-HDL Cholesterol (Calc): 103 mg/dL (calc) (ref <130)
Total CHOL/HDL Ratio: 3.6 (calc) (ref <5.0)
Triglycerides: 158 mg/dL — ABNORMAL HIGH (ref <150)

## 2019-09-30 DIAGNOSIS — Z1231 Encounter for screening mammogram for malignant neoplasm of breast: Secondary | ICD-10-CM | POA: Diagnosis not present

## 2019-09-30 DIAGNOSIS — Z803 Family history of malignant neoplasm of breast: Secondary | ICD-10-CM | POA: Diagnosis not present

## 2019-09-30 LAB — HM MAMMOGRAPHY

## 2019-10-02 ENCOUNTER — Ambulatory Visit: Payer: Medicare Other | Admitting: Nurse Practitioner

## 2019-10-02 NOTE — Progress Notes (Signed)
Remote pacemaker transmission.   

## 2019-10-03 ENCOUNTER — Encounter: Payer: Self-pay | Admitting: *Deleted

## 2019-10-03 ENCOUNTER — Ambulatory Visit (INDEPENDENT_AMBULATORY_CARE_PROVIDER_SITE_OTHER): Payer: Medicare Other | Admitting: Nurse Practitioner

## 2019-10-03 ENCOUNTER — Other Ambulatory Visit: Payer: Self-pay

## 2019-10-03 ENCOUNTER — Encounter: Payer: Self-pay | Admitting: Nurse Practitioner

## 2019-10-03 VITALS — BP 140/78 | HR 64 | Temp 97.5°F | Resp 20 | Ht 63.0 in | Wt 187.2 lb

## 2019-10-03 DIAGNOSIS — G4700 Insomnia, unspecified: Secondary | ICD-10-CM

## 2019-10-03 DIAGNOSIS — M48062 Spinal stenosis, lumbar region with neurogenic claudication: Secondary | ICD-10-CM

## 2019-10-03 DIAGNOSIS — H5789 Other specified disorders of eye and adnexa: Secondary | ICD-10-CM

## 2019-10-03 DIAGNOSIS — E785 Hyperlipidemia, unspecified: Secondary | ICD-10-CM | POA: Diagnosis not present

## 2019-10-03 DIAGNOSIS — K219 Gastro-esophageal reflux disease without esophagitis: Secondary | ICD-10-CM

## 2019-10-03 DIAGNOSIS — M810 Age-related osteoporosis without current pathological fracture: Secondary | ICD-10-CM

## 2019-10-03 DIAGNOSIS — I10 Essential (primary) hypertension: Secondary | ICD-10-CM

## 2019-10-03 DIAGNOSIS — I480 Paroxysmal atrial fibrillation: Secondary | ICD-10-CM

## 2019-10-03 DIAGNOSIS — F411 Generalized anxiety disorder: Secondary | ICD-10-CM

## 2019-10-03 MED ORDER — TRAZODONE HCL 50 MG PO TABS: 50.0000 mg | ORAL_TABLET | Freq: Every day | ORAL | 3 refills | Status: DC

## 2019-10-03 MED ORDER — ALPRAZOLAM 0.25 MG PO TABS: 0.1250 mg | ORAL_TABLET | Freq: Every day | ORAL | 1 refills | Status: DC | PRN

## 2019-10-03 NOTE — Progress Notes (Signed)
Careteam: Patient Care Team: Lauree Chandler, NP as PCP - General (Geriatric Medicine) Martinique, Peter M, MD as PCP - Cardiology (Cardiology) Mcarthur Rossetti, MD as Attending Physician (Orthopedic Surgery) Larey Dresser, MD as Attending Physician (Cardiology) Thornell Sartorius, MD as Consulting Physician (Otolaryngology) Druscilla Brownie, MD as Consulting Physician (Dermatology) Calvert Cantor, MD as Consulting Physician (Ophthalmology) Debara Pickett Nadean Corwin, MD as Consulting Physician (Cardiology)  Advanced Directive information    Allergies  Allergen Reactions   Advil [Ibuprofen]     GI upset   Aleve [Naproxen Sodium]     GI upset   Aspirin Nausea And Vomiting    Takes baby aspirin qod without problems   Atorvastatin     Leg cramps   Celebrex [Celecoxib]     GI upset   Diclofenac     GI upset   Doxycycline     Headache   Fosamax [Alendronate Sodium]     GI discomfort    Metoclopramide Hcl     REACTION: heart palps   Nsaids Other (See Comments)    Tears my stomach up    Other     Antihistamine - increase BP   Pravastatin     Leg cramps   Timolol     Dropped HR    Chief Complaint  Patient presents with   Medical Management of Chronic Issues    6 mo f/u     HPI: Patient is a 85 y.o. female seen in the office today for routine follow up   sinus bradycardia and atrial fibrillation/flutter- continues on eliquis, metoprolol, propafenone-   Spinal stenosis - underwent L4-5 lumbar laminectomy in June by Dr Boyce Medici neurosurgery. reports the "electricity" is gone. Ongoing weakness but exercising trying to gain strength. Xanax helping relax her back and helps her leg to help her sleep. Still sleeping in a chair for comfort   Osteoporosis- taking fosamax weekly and using cal and vit d.   GERD- controlled on nexium when she needs it, does not need often  Insomnia- using trazodone but does not help much.   hyperlipidemia - improved  triglycerides- on zetia 10 mg daily   Hypertension- stable, continues on valsartan, lopressor, and  HCTZ  Feels like she has something in her eye. Washed eye which improved symptoms but still a little irritated.   Dr Pearline Cables took a lesion off leg- plans to follow up with him.  Walking every day to help gain strength in her legs  Review of Systems:  Review of Systems  Constitutional: Negative for chills, fever and weight loss.  Eyes: Negative for blurred vision.  Respiratory: Negative for cough, sputum production and shortness of breath.   Cardiovascular: Negative for chest pain, palpitations and leg swelling.  Gastrointestinal: Positive for constipation (controlled with medications) and heartburn (with diet and nexium). Negative for abdominal pain and diarrhea.  Genitourinary: Negative for dysuria, frequency and urgency.  Musculoskeletal: Positive for back pain (improved). Negative for falls, joint pain and myalgias.  Skin: Negative.   Neurological: Negative for dizziness and headaches.  Psychiatric/Behavioral: Negative for depression and memory loss. The patient is nervous/anxious and has insomnia.     Past Medical History:  Diagnosis Date   Allergic rhinitis due to pollen    Anxiety state, unspecified    Sitautional- pain   Arthritis    "right knee" (06/21/2016)   Asymptomatic varicose veins    Carpal tunnel syndrome    Chest pain, unspecified    Chronic lower back  pain    "on the left side" (06/21/2016)   Constipation    Cramp of limb    Depressive disorder, not elsewhere classified    Diaphragmatic hernia without mention of obstruction or gangrene    Diaphragmatic hernia without mention of obstruction or gangrene    Disturbance of skin sensation    Diverticulosis of colon (without mention of hemorrhage)    Dysrhythmia    Atrial Flutter   Enthesopathy of hip region    Esophageal reflux    , just occasional   History of blood transfusion    "w/both knee  replacements"   History of duodenal ulcer    History of kidney stones    passed   Insomnia, unspecified    Lumbago    Migraine    "none since ~ 1990" (06/21/2016)   Myalgia and myositis, unspecified    Obesity, unspecified    Other abnormal blood chemistry    Other dysphagia    Other malaise and fatigue    Other nonspecific abnormal serum enzyme levels    Other specified cardiac dysrhythmias(427.89)    Pacemaker    Pain in joint, ankle and foot    Pain in joint, hand    Pain in joint, lower leg    Pain in joint, pelvic region and thigh    Pain in limb    Plantar fascial fibromatosis    Presence of permanent cardiac pacemaker    -[employer]   Reflux esophagitis    Sciatica    Spinal stenosis, unspecified region other than cervical    Stricture and stenosis of esophagus    Symptomatic menopausal or female climacteric states    Unspecified essential hypertension    Unspecified essential hypertension    Unspecified vitamin D deficiency    Past Surgical History:  Procedure Laterality Date   BREAST BIOPSY Left 1990s X 2   CARDIOVERSION N/A 04/26/2016   Procedure: CARDIOVERSION;  Surgeon: Pixie Casino, MD;  Location: [facility];  Service: Cardiovascular;  Laterality: N/A;   CATARACT EXTRACTION W/ INTRAOCULAR LENS IMPLANT Left 08/03/1999   DR EPES    CATARACT EXTRACTION W/ INTRAOCULAR LENS IMPLANT Right 2000   DR EPES   CHOLECYSTECTOMY OPEN  1989   DR BOWMAN   COLONOSCOPY  1988   Normal    DENTAL SURGERY Left 08/2016   EP IMPLANTABLE DEVICE N/A 06/21/2016   Procedure: Pacemaker Implant;  Surgeon: Evans Lance, MD;  Location: [facility];  Service: Cardiovascular;  Laterality: N/A;   ESOPHAGOGASTRODUODENOSCOPY (EGD) WITH ESOPHAGEAL DILATION  ~ 1982   Dr. Sharlett Iles   EXCISION OF ACTINIC KERATOSIS     DR LUPTON    EYE SURGERY     INSERT / REPLACE / REMOVE PACEMAKER     JOINT REPLACEMENT     KNEE ARTHROSCOPY Left 2003    KNEE ARTHROSCOPY Right 06-26-13   KNEE CLOSED REDUCTION Right 10/23/2013   Procedure: CLOSED MANIPULATION RIGHT KNEE;  Surgeon: Mcarthur Rossetti, MD;  Location: [facility];  Service: Orthopedics;  Laterality: Right;   LASER FOR CLOUDY CAP LEFT EYE Left 03/2006   DR DIGBY   LUMBAR LAMINECTOMY/DECOMPRESSION MICRODISCECTOMY N/A 06/12/2019   Procedure: Laminectomy and Foraminotomy - Lumbar four-Lumbar five;  Surgeon: Eustace Moore, MD;  Location: [facility];  Service: Neurosurgery;  Laterality: N/A;   TOTAL KNEE ARTHROPLASTY Left 04/2004   DR RENDALL   TOTAL KNEE ARTHROPLASTY Right 07/04/2013   Procedure: RIGHT TOTAL KNEE ARTHROPLASTY;  Surgeon: Mcarthur Rossetti,  MD;  Location: [facility];  Service: Orthopedics;  Laterality: Right;   TRIGGER FINGER RELEASE Right 09/2018   VAGINAL HYSTERECTOMY  1979   Social History:   reports that she has never smoked. She has never used smokeless tobacco. She reports that she does not drink alcohol or use drugs.  Family History  Problem Relation Age of Onset   Ovarian cancer Mother    Uterine cancer Mother    Cerebrovascular Accident Mother    Heart disease Father    Hypertension Brother    Obesity Daughter     Medications: Patient's Medications  New Prescriptions   No medications on file  Previous Medications   ACETAMINOPHEN (TYLENOL) 500 MG TABLET    Take 500-1,000 mg by mouth daily.    ALENDRONATE (FOSAMAX) 70 MG TABLET    Take 70 mg by mouth every Saturday. Take with a full glass of water on an empty stomach.    ALPRAZOLAM (XANAX) 0.25 MG TABLET    Take 0.5 tablets (0.125 mg total) by mouth daily as needed for anxiety.   ANTISEPTIC ORAL RINSE (BIOTENE) LIQD    15 mLs by Mouth Rinse route daily.    APIXABAN (ELIQUIS) 5 MG TABS TABLET    Take 1 tablet (5 mg total) by mouth 2 (two) times daily.   ARTIFICIAL SALIVA (ACT DRY MOUTH) LOZG    Use as directed 1 drop in the mouth or throat as needed (dry mouth).   AZITHROMYCIN (ZITHROMAX) 250 MG  TABLET       BETAMETHASONE DIPROPIONATE 0.05 % LOTION    Apply topically as needed.   BIMATOPROST (LUMIGAN) 0.01 % SOLN    Place 1 drop into both eyes at bedtime.    CALCIUM CARB-CHOLECALCIFEROL (CALTRATE 600+D3) 600-800 MG-UNIT TABS    1 tablet twice daily   CALCIUM CARBONATE ANTACID ([employer] ANTACID SOFT CHEWS ULTR ST) 1177 MG CHEW    Chew 1,177 mg by mouth 3 (three) times daily as needed (acid reflux/indigestion.).   CHOLECALCIFEROL (VITAMIN D) 2000 UNITS TABLET    Take 2,000 Units by mouth daily.   DENTIFRICES (BIOTENE DRY MOUTH) PSTE    Place 1 application onto teeth 2 (two) times a day.   DORZOLAMIDE (TRUSOPT) 2 % OPHTHALMIC SOLUTION    Place 1 drop into both eyes 2 (two) times daily.    ELIQUIS 5 MG TABS TABLET    Take 5 mg by mouth 2 (two) times daily.    ESOMEPRAZOLE (NEXIUM) 20 MG CAPSULE    TAKE 1 CAPSULE BY MOUTH EVERY DAY   EZETIMIBE (ZETIA) 10 MG TABLET    TAKE 1 TABLET ONCE DAILY.   FLUTICASONE (FLONASE) 50 MCG/ACT NASAL SPRAY    Place 1 spray into both nostrils daily.   HYDROCHLOROTHIAZIDE (HYDRODIURIL) 25 MG TABLET    TAKE 1 TABLET BY MOUTH EVERY DAY   METHYLCELLULOSE (ARTIFICIAL TEARS) 1 % OPHTHALMIC SOLUTION    Place 1 drop into both eyes daily as needed (for dry eyes).    METOPROLOL TARTRATE (LOPRESSOR) 50 MG TABLET    Take 1 tablet (50 mg total) by mouth 2 (two) times daily.   NYSTATIN (MYCOSTATIN/NYSTOP) POWDER    Apply topically 3 (three) times daily.   NYSTATIN CREAM (MYCOSTATIN)    Apply 1 application topically 2 (two) times daily.   PROPAFENONE (RYTHMOL SR) 225 MG 12 HR CAPSULE    TAKE 1 CAPSULE (225 MG TOTAL) BY MOUTH 2 (TWO) TIMES DAILY.   TRAMADOL (ULTRAM) 50 MG TABLET    Take  1-2 tablets (50-100 mg total) by mouth every 6 (six) hours as needed.   TRAZODONE (DESYREL) 50 MG TABLET    TAKE 1 TABLET BY MOUTH EVERY NIGHT TO HELP WITH SLEEP   TROLAMINE SALICYLATE (ASPERCREME EX)    Apply 1 application topically at bedtime as needed (leg pain.).   VALSARTAN (DIOVAN) 320 MG  TABLET    Take 1 tablet (320 mg total) by mouth daily.  Modified Medications   No medications on file  Discontinued Medications   HYDROCODONE-ACETAMINOPHEN (NORCO) 7.5-325 MG TABLET    Take 1 tablet by mouth every 6 (six) hours.   METHYLPREDNISOLONE (MEDROL) 4 MG TABLET    Take as directed    Physical Exam:  Vitals:   10/03/19 1542  BP: 140/78  Pulse: 64  Resp: 20  Temp: (!) 97.5 F (36.4 C)  SpO2: 98%  Weight: 187 lb 3.2 oz (84.9 kg)  Height: 5\' 3"  (1.6 m)   Body mass index is 33.16 kg/m. Wt Readings from Last 3 Encounters:  10/03/19 187 lb 3.2 oz (84.9 kg)  09/22/19 183 lb (83 kg)  06/12/19 183 lb (83 kg)    Physical Exam Constitutional:      General: She is not in acute distress.    Appearance: She is obese.  HENT:     Head: Normocephalic.     Right Ear: Tympanic membrane, ear canal and external ear normal. There is no impacted cerumen.     Left Ear: Tympanic membrane, ear canal and external ear normal. There is no impacted cerumen.     Nose: Nose normal. No congestion or rhinorrhea.     Mouth/Throat:     Mouth: Mucous membranes are moist.     Tongue: No lesions.     Pharynx: No oropharyngeal exudate or posterior oropharyngeal erythema.     Tonsils: No tonsillar exudate or tonsillar abscesses. 2+ on the right. 2+ on the left.  Eyes:     General: Lids are normal. No scleral icterus.       Right eye: No foreign body or discharge.        Left eye: No foreign body or discharge.     Extraocular Movements: Extraocular movements intact.     Conjunctiva/sclera: Conjunctivae normal.     Pupils: Pupils are equal, round, and reactive to light.  Neck:     Musculoskeletal: Neck supple. No muscular tenderness.  Cardiovascular:     Rate and Rhythm: Normal rate and regular rhythm.     Pulses: Normal pulses.     Heart sounds: Normal heart sounds. No murmur. No friction rub. No gallop.   Pulmonary:     Effort: Pulmonary effort is normal. No respiratory distress.     Breath  sounds: Normal breath sounds. No wheezing, rhonchi or rales.  Chest:     Chest wall: No tenderness.  Abdominal:     General: Bowel sounds are normal. There is no distension.     Palpations: Abdomen is soft. There is no mass.     Tenderness: There is no abdominal tenderness. There is no right CVA tenderness, left CVA tenderness or guarding.  Musculoskeletal: Normal range of motion.        General: No tenderness.     Right lower leg: No edema.     Left lower leg: No edema.  Lymphadenopathy:     Cervical: No cervical adenopathy.  Skin:    General: Skin is warm and dry.     Coloration: Skin is not pale.  Findings: No erythema, lesion (to left lower leg, scabbed over that is being followed by derm) or rash.  Neurological:     Mental Status: She is alert and oriented to person, place, and time.     Sensory: No sensory deficit.     Gait: Gait normal.  Psychiatric:        Mood and Affect: Mood normal.        Speech: Speech normal.        Behavior: Behavior normal.        Thought Content: Thought content normal.        Judgment: Judgment normal.     Labs reviewed: Basic Metabolic Panel: Recent Labs    03/27/19 0901 06/09/19 1109 09/29/19 0928  NA 139 138 140  K 3.8 3.7 3.5  CL 102 101 104  CO2 30 28 31   GLUCOSE 97 107* 92  BUN 15 11 18   CREATININE 0.73 0.70 0.73  CALCIUM 9.2 9.0 9.1   Liver Function Tests: Recent Labs    03/27/19 0901 09/29/19 0928  AST 17 18  ALT 15 20  BILITOT 0.4 0.4  PROT 6.3 6.5   No results for input(s): LIPASE, AMYLASE in the last 8760 hours. No results for input(s): AMMONIA in the last 8760 hours. CBC: Recent Labs    03/27/19 0901 06/09/19 1109 09/29/19 0928  WBC 5.3 7.5 5.1  NEUTROABS 2,475 4.5 2,351  HGB 12.3 13.3 12.4  HCT 36.4 41.1 38.1  MCV 92.9 95.4 92.5  PLT 188 186 169   Lipid Panel: Recent Labs    03/27/19 0901 09/29/19 0928  CHOL 162 142  HDL 37* 39*  LDLCALC 93 78  TRIG 233* 158*  CHOLHDL 4.4 3.6    TSH: No results for input(s): TSH in the last 8760 hours. A1C: Lab Results  Component Value Date   HGBA1C 5.2 08/06/2017     Assessment/Plan 1. Insomnia, unspecified type -ongoing, will take trazodone 50 mg daily at bedtime to see if this helps with consistent sleep. May continue to use alprazolam as needed  - ALPRAZolam (XANAX) 0.25 MG tablet; Take 0.5 tablets (0.125 mg total) by mouth daily as needed for anxiety.  Dispense: 30 tablet; Refill: 1 - traZODone (DESYREL) 50 MG tablet; Take 1 tablet (50 mg total) by mouth at bedtime.  Dispense: 30 tablet; Refill: 3  2. Osteoporosis, unspecified osteoporosis type, unspecified pathological fracture presence -continues on fosamax, cal with vit d and weight bearing activity.   3. Paroxysmal atrial fibrillation (HCC) Stable, rate controlled, continues follow up with cardiologist. Continues on eliquis for anticoagulation and tolerating well without signs of bleeding. Continues on lopressor for rate control  4. Hyperlipidemia, unspecified hyperlipidemia type Stable, Reviewed labs with pt, continue to work on dietary modification with zetia daily  5. Anxiety state Stable at this time, continues to use xanax as needed, using more at night which helps her relax and sleep. Discussed using trazodone 50 mg by mouth at bedtime to also help manage this.    6. Spinal stenosis of lumbar region with neurogenic claudication s/p L4-5 lumbar laminectomy in June, doing well post-op, pain has improved but weakness persist. Continues to exercise to work on strength.   7. Gastroesophageal reflux disease without esophagitis -stable on diet and uses Nexium as needed with good results.  8. Essential hypertension -stable, will continue current regimen with dietary modifications.   Next appt: 6 months with labs prior  Rhegan Trunnell K. Jansen, [facility]  [phone]  °

## 2019-10-03 NOTE — Patient Instructions (Signed)
Keep up the good work

## 2019-10-22 ENCOUNTER — Other Ambulatory Visit: Payer: Self-pay | Admitting: Nurse Practitioner

## 2019-10-25 ENCOUNTER — Encounter

## 2019-10-29 ENCOUNTER — Other Ambulatory Visit: Payer: Self-pay

## 2019-10-29 ENCOUNTER — Encounter: Payer: Self-pay | Admitting: Internal Medicine

## 2019-10-29 ENCOUNTER — Ambulatory Visit: Payer: Medicare Other | Admitting: Internal Medicine

## 2019-10-29 VITALS — BP 138/64 | HR 62 | Ht 63.0 in | Wt 188.0 lb

## 2019-10-29 DIAGNOSIS — I480 Paroxysmal atrial fibrillation: Secondary | ICD-10-CM | POA: Diagnosis not present

## 2019-10-29 DIAGNOSIS — I495 Sick sinus syndrome: Secondary | ICD-10-CM | POA: Diagnosis not present

## 2019-10-29 DIAGNOSIS — I10 Essential (primary) hypertension: Secondary | ICD-10-CM

## 2019-10-29 DIAGNOSIS — Z950 Presence of cardiac pacemaker: Secondary | ICD-10-CM | POA: Diagnosis not present

## 2019-10-29 NOTE — Patient Instructions (Signed)
Medication Instructions:  Your physician recommends that you continue on your current medications as directed. Please refer to the Current Medication list given to you today.  Labwork: None ordered.  Testing/Procedures: None ordered.  Follow-Up: Your physician wants you to follow-up in: one year with Dr. Lovena Le.   You will receive a reminder letter in the mail two months in advance. If you don't receive a letter, please call our office to schedule the follow-up appointment.  Remote monitoring is used to monitor your Pacemaker from home. This monitoring reduces the number of office visits required to check your device to one time per year. It allows [place] to keep an eye on the functioning of your device to ensure it is working properly. You are scheduled for a device check from home on 12/23/2019. You may send your transmission at any time that day. If you have a wireless device, the transmission will be sent automatically. After your physician reviews your transmission, you will receive a postcard with your next transmission date.  Any Other Special Instructions Will Be Listed Below (If Applicable).  If you need a refill on your cardiac medications before your next appointment, please call your pharmacy.

## 2019-10-29 NOTE — Progress Notes (Signed)
HPI [name] returns today for followup. She is a pleasant 85 yo woman with PAF, HTN, and sinus node dysfunction. She underwent PPM insertion over 3 years ago for symptomatic sinus node dysfunction. She also had atrial fib with a very rapid VR. She has done well in the interim. No chest pain or sob. No syncope. She admits to some dietary indiscretion.  Allergies  Allergen Reactions  . Advil [Ibuprofen]     GI upset  . Aleve [Naproxen Sodium]     GI upset  . Aspirin Nausea And Vomiting    Takes baby aspirin qod without problems  . Atorvastatin     Leg cramps  . Celebrex [Celecoxib]     GI upset  . Diclofenac     GI upset  . Doxycycline     Headache  . Fosamax [Alendronate Sodium]     GI discomfort   . Metoclopramide Hcl     REACTION: heart palps  . Nsaids Other (See Comments)    Tears my stomach up   . Other     Antihistamine - increase BP  . Pravastatin     Leg cramps  . Timolol     Dropped HR     Current Outpatient Medications  Medication Sig Dispense Refill  . acetaminophen (TYLENOL) 500 MG tablet Take 500-1,000 mg by mouth daily.     Marland Kitchen alendronate (FOSAMAX) 70 MG tablet Take 70 mg by mouth every Saturday. Take with a full glass of water on an empty stomach.     . ALPRAZolam (XANAX) 0.25 MG tablet Take 0.5 tablets (0.125 mg total) by mouth daily as needed for anxiety. 30 tablet 1  . antiseptic oral rinse (BIOTENE) LIQD 15 mLs by Mouth Rinse route daily.     Marland Kitchen apixaban (ELIQUIS) 5 MG TABS tablet Take 1 tablet (5 mg total) by mouth 2 (two) times daily. 180 tablet 3  . Artificial Saliva (ACT DRY MOUTH) LOZG Use as directed 1 drop in the mouth or throat as needed (dry mouth).    . betamethasone dipropionate 0.05 % lotion Apply topically as needed.    . bimatoprost (LUMIGAN) 0.01 % SOLN Place 1 drop into both eyes at bedtime.     . Calcium Carb-Cholecalciferol (CALTRATE 600+D3) 600-800 MG-UNIT TABS 1 tablet twice daily (Patient taking differently: Take 1 tablet by  mouth daily. ) 60 tablet 0  . Calcium Carbonate Antacid ([employer] ANTACID SOFT CHEWS ULTR ST) 1177 MG CHEW Chew 1,177 mg by mouth 3 (three) times daily as needed (acid reflux/indigestion.).    Marland Kitchen Cholecalciferol (VITAMIN D) 2000 UNITS tablet Take 2,000 Units by mouth daily.    . Dentifrices (BIOTENE DRY MOUTH) PSTE Place 1 application onto teeth 2 (two) times a day.    . dorzolamide (TRUSOPT) 2 % ophthalmic solution Place 1 drop into both eyes 2 (two) times daily.     Marland Kitchen ELIQUIS 5 MG TABS tablet Take 5 mg by mouth 2 (two) times daily.     Marland Kitchen esomeprazole (NEXIUM) 20 MG capsule TAKE 1 CAPSULE BY MOUTH EVERY DAY (Patient taking differently: as needed. TAKE 1 CAPSULE BY MOUTH EVERY DAY) 30 capsule 2  . ezetimibe (ZETIA) 10 MG tablet TAKE 1 TABLET ONCE DAILY. 90 tablet 0  . fluticasone (FLONASE) 50 MCG/ACT nasal spray Place 1 spray into both nostrils daily. (Patient taking differently: Place 1 spray into both nostrils daily as needed (allergies.). ) 16 g 6  . hydrochlorothiazide (HYDRODIURIL) 25 MG tablet TAKE  1 TABLET BY MOUTH EVERY DAY 90 tablet 0  . methylcellulose (ARTIFICIAL TEARS) 1 % ophthalmic solution Place 1 drop into both eyes daily as needed (for dry eyes).     . metoprolol tartrate (LOPRESSOR) 50 MG tablet Take 1 tablet (50 mg total) by mouth 2 (two) times daily. 180 tablet 2  . nystatin (MYCOSTATIN/NYSTOP) powder Apply topically 3 (three) times daily. 15 g 0  . nystatin cream (MYCOSTATIN) Apply 1 application topically 2 (two) times daily. 30 g 0  . propafenone (RYTHMOL SR) 225 MG 12 hr capsule TAKE 1 CAPSULE (225 MG TOTAL) BY MOUTH 2 (TWO) TIMES DAILY. 60 capsule 8  . traMADol (ULTRAM) 50 MG tablet Take 1-2 tablets (50-100 mg total) by mouth every 6 (six) hours as needed. (Patient taking differently: Take 25 mg by mouth every 6 (six) hours as needed (pain.). ) 60 tablet 0  . traZODone (DESYREL) 50 MG tablet Take 1 tablet (50 mg total) by mouth at bedtime. 30 tablet 3  . Trolamine Salicylate  (ASPERCREME EX) Apply 1 application topically at bedtime as needed (leg pain.).    Marland Kitchen valsartan (DIOVAN) 320 MG tablet Take 1 tablet (320 mg total) by mouth daily. 90 tablet 3  . valsartan-hydrochlorothiazide (DIOVAN-HCT) 320-25 MG tablet TAKE 1 TABLET ONCE DAILY. 90 tablet 0   No current facility-administered medications for this visit.      Past Medical History:  Diagnosis Date  . Allergic rhinitis due to pollen   . Anxiety state, unspecified    Sitautional- pain  . Arthritis    "right knee" (06/21/2016)  . Asymptomatic varicose veins   . Carpal tunnel syndrome   . Chest pain, unspecified   . Chronic lower back pain    "on the left side" (06/21/2016)  . Constipation   . Cramp of limb   . Depressive disorder, not elsewhere classified   . Diaphragmatic hernia without mention of obstruction or gangrene   . Diaphragmatic hernia without mention of obstruction or gangrene   . Disturbance of skin sensation   . Diverticulosis of colon (without mention of hemorrhage)   . Dysrhythmia    Atrial Flutter  . Enthesopathy of hip region   . Esophageal reflux    , just occasional  . History of blood transfusion    "w/both knee replacements"  . History of duodenal ulcer   . History of kidney stones    passed  . Insomnia, unspecified   . Lumbago   . Migraine    "none since ~ 1990" (06/21/2016)  . Myalgia and myositis, unspecified   . Obesity, unspecified   . Other abnormal blood chemistry   . Other dysphagia   . Other malaise and fatigue   . Other nonspecific abnormal serum enzyme levels   . Other specified cardiac dysrhythmias(427.89)   . Pacemaker   . Pain in joint, ankle and foot   . Pain in joint, hand   . Pain in joint, lower leg   . Pain in joint, pelvic region and thigh   . Pain in limb   . Plantar fascial fibromatosis   . Presence of permanent cardiac pacemaker    -St Jude  . Reflux esophagitis   . Sciatica   . Spinal stenosis, unspecified region other than cervical   .  Stricture and stenosis of esophagus   . Symptomatic menopausal or female climacteric states   . Unspecified essential hypertension   . Unspecified essential hypertension   . Unspecified vitamin D deficiency  ROS:   All systems reviewed and negative except as noted in the HPI.   Past Surgical History:  Procedure Laterality Date  . BREAST BIOPSY Left 1990s X 2  . CARDIOVERSION N/A 04/26/2016   Procedure: CARDIOVERSION;  Surgeon: Pixie Casino, MD;  Location: [facility];  Service: Cardiovascular;  Laterality: N/A;  . CATARACT EXTRACTION W/ INTRAOCULAR LENS IMPLANT Left 08/03/1999   DR EPES   . CATARACT EXTRACTION W/ INTRAOCULAR LENS IMPLANT Right 2000   DR EPES  . [place]  . COLONOSCOPY  1988   Normal   . DENTAL SURGERY Left 08/2016  . EP IMPLANTABLE DEVICE N/A 06/21/2016   Procedure: Pacemaker Implant;  Surgeon: Evans Lance, MD;  Location: [facility];  Service: Cardiovascular;  Laterality: N/A;  . ESOPHAGOGASTRODUODENOSCOPY (EGD) WITH ESOPHAGEAL DILATION  ~ 1982   Dr. Sharlett Iles  . EXCISION OF ACTINIC KERATOSIS     DR LUPTON   . EYE SURGERY    . INSERT / REPLACE / REMOVE PACEMAKER    . JOINT REPLACEMENT    . KNEE ARTHROSCOPY Left 2003  . KNEE ARTHROSCOPY Right 06-26-13  . KNEE CLOSED REDUCTION Right 10/23/2013   Procedure: CLOSED MANIPULATION RIGHT KNEE;  Surgeon: Mcarthur Rossetti, MD;  Location: [facility];  Service: Orthopedics;  Laterality: Right;  . LASER FOR CLOUDY CAP LEFT EYE Left 03/2006   DR DIGBY  . LUMBAR LAMINECTOMY/DECOMPRESSION MICRODISCECTOMY N/A 06/12/2019   Procedure: Laminectomy and Foraminotomy - Lumbar four-Lumbar five;  Surgeon: Eustace Moore, MD;  Location: [facility];  Service: Neurosurgery;  Laterality: N/A;  . TOTAL KNEE ARTHROPLASTY Left 04/2004   DR RENDALL  . TOTAL KNEE ARTHROPLASTY Right 07/04/2013   Procedure: RIGHT TOTAL KNEE ARTHROPLASTY;  Surgeon: Mcarthur Rossetti, MD;  Location: [facility];  Service:  Orthopedics;  Laterality: Right;  . TRIGGER FINGER RELEASE Right 09/2018  . VAGINAL HYSTERECTOMY  1979     Family History  Problem Relation Age of Onset  . Ovarian cancer Mother   . Uterine cancer Mother   . Cerebrovascular Accident Mother   . Heart disease Father   . Hypertension Brother   . Obesity Daughter      Social History   Socioeconomic History  . Marital status: Married    Spouse name: Not on file  . Number of children: Not on file  . Years of education: Not on file  . Highest education level: Not on file  Occupational History  . Not on file  Social Needs  . Financial resource strain: Not hard at all  . Food insecurity    Worry: Never true    Inability: Never true  . Transportation needs    Medical: No    Non-medical: No  Tobacco Use  . Smoking status: Never Smoker  . Smokeless tobacco: Never Used  Substance and Sexual Activity  . Alcohol use: No  . Drug use: No  . Sexual activity: Yes  Lifestyle  . Physical activity    Days per week: 5 days    Minutes per session: 30 min  . Stress: To some extent  Relationships  . Social Herbalist on phone: Once a week    Gets together: More than three times a week    Attends religious service: 1 to 4 times per year    Active member of club or organization: Yes    Attends meetings of clubs or organizations: 1 to 4 times  per year    Relationship status: Married  . Intimate partner violence    Fear of current or ex partner: No    Emotionally abused: No    Physically abused: No    Forced sexual activity: No  Other Topics Concern  . Not on file  Social History Narrative  . Not on file     BP 138/64   Pulse 62   Ht 5\' 3"  (1.6 m)   Wt 188 lb (85.3 kg)   SpO2 98%   BMI 33.30 kg/m   Physical Exam:  Well appearing NAD HEENT: Unremarkable Neck:  No JVD, no thyromegally Lymphatics:  No adenopathy Back:  No CVA tenderness Lungs:  Clear with no wheezes HEART:  Regular rate rhythm, no murmurs,  no rubs, no clicks Abd:  soft, positive bowel sounds, no organomegally, no rebound, no guarding Ext:  2 plus pulses, no edema, no cyanosis, no clubbing Skin:  No rashes no nodules Neuro:  CN II through XII intact, motor grossly intact  EKG - NSR with atrial pacing  DEVICE  Normal device function.  See PaceArt for details.   Assess/Plan: 1. Sinus node dysfunction - she is asymptomatic, s/p PPM insertion. 2. PAf - she is mostly maintaining NSR on low dose propafenone. 3. HTN - her SBP is well controlled. She will continue her current meds. 4. Diastolic heart failure - I encouraged her to reduce her salt intake. She will continue her current cardiac meds.  Mikle Bosworth.D.

## 2019-10-30 DIAGNOSIS — D485 Neoplasm of uncertain behavior of skin: Secondary | ICD-10-CM | POA: Diagnosis not present

## 2019-10-30 DIAGNOSIS — L905 Scar conditions and fibrosis of skin: Secondary | ICD-10-CM | POA: Diagnosis not present

## 2019-10-30 DIAGNOSIS — Z85828 Personal history of other malignant neoplasm of skin: Secondary | ICD-10-CM | POA: Diagnosis not present

## 2019-10-31 DIAGNOSIS — B079 Viral wart, unspecified: Secondary | ICD-10-CM | POA: Diagnosis not present

## 2019-11-13 DIAGNOSIS — H524 Presbyopia: Secondary | ICD-10-CM | POA: Diagnosis not present

## 2019-11-13 DIAGNOSIS — H401131 Primary open-angle glaucoma, bilateral, mild stage: Secondary | ICD-10-CM | POA: Diagnosis not present

## 2019-11-13 DIAGNOSIS — H0100A Unspecified blepharitis right eye, upper and lower eyelids: Secondary | ICD-10-CM | POA: Diagnosis not present

## 2019-11-24 ENCOUNTER — Other Ambulatory Visit: Payer: Self-pay | Admitting: Physical Medicine and Rehabilitation

## 2019-11-24 DIAGNOSIS — R200 Anesthesia of skin: Secondary | ICD-10-CM

## 2019-11-28 DIAGNOSIS — L602 Onychogryphosis: Secondary | ICD-10-CM | POA: Diagnosis not present

## 2019-11-28 DIAGNOSIS — I739 Peripheral vascular disease, unspecified: Secondary | ICD-10-CM | POA: Diagnosis not present

## 2019-12-17 NOTE — Telephone Encounter (Signed)
error 

## 2019-12-23 ENCOUNTER — Ambulatory Visit (INDEPENDENT_AMBULATORY_CARE_PROVIDER_SITE_OTHER): Payer: Medicare Other | Admitting: *Deleted

## 2019-12-23 DIAGNOSIS — I480 Paroxysmal atrial fibrillation: Secondary | ICD-10-CM | POA: Diagnosis not present

## 2019-12-24 LAB — CUP PACEART REMOTE DEVICE CHECK
Battery Remaining Longevity: 123 mo
Battery Remaining Percentage: 95.5 %
Battery Voltage: 2.98 V
Brady Statistic AP VP Percent: 3.1 %
Brady Statistic AP VS Percent: 95 %
Brady Statistic AS VP Percent: 1 %
Brady Statistic AS VS Percent: 1.9 %
Brady Statistic RA Percent Paced: 98 %
Brady Statistic RV Percent Paced: 3.1 %
Date Time Interrogation Session: 20201229212910
Implantable Lead Implant Date: 20170628
Implantable Lead Implant Date: 20170628
Implantable Lead Location: 753859
Implantable Lead Location: 753860
Implantable Pulse Generator Implant Date: 20170628
Lead Channel Impedance Value: 530 Ohm
Lead Channel Impedance Value: 590 Ohm
Lead Channel Pacing Threshold Amplitude: 0.75 V
Lead Channel Pacing Threshold Amplitude: 1.25 V
Lead Channel Pacing Threshold Pulse Width: 0.4 ms
Lead Channel Pacing Threshold Pulse Width: 0.4 ms
Lead Channel Sensing Intrinsic Amplitude: 1.3 mV
Lead Channel Sensing Intrinsic Amplitude: 8.2 mV
Lead Channel Setting Pacing Amplitude: 2 V
Lead Channel Setting Pacing Amplitude: 2.5 V
Lead Channel Setting Pacing Pulse Width: 0.4 ms
Lead Channel Setting Sensing Sensitivity: 2 mV
Pulse Gen Model: 2272
Pulse Gen Serial Number: 7910313

## 2020-01-07 ENCOUNTER — Other Ambulatory Visit: Payer: Self-pay

## 2020-01-07 ENCOUNTER — Encounter: Payer: Medicare Other | Admitting: Nurse Practitioner

## 2020-01-12 ENCOUNTER — Ambulatory Visit (INDEPENDENT_AMBULATORY_CARE_PROVIDER_SITE_OTHER): Payer: Medicare Other | Admitting: Nurse Practitioner

## 2020-01-12 ENCOUNTER — Encounter: Payer: Self-pay | Admitting: Nurse Practitioner

## 2020-01-12 ENCOUNTER — Other Ambulatory Visit: Payer: Self-pay

## 2020-01-12 DIAGNOSIS — Z0000 Encounter for general adult medical examination without abnormal findings: Secondary | ICD-10-CM

## 2020-01-12 NOTE — Progress Notes (Signed)
   This service is provided via telemedicine  No vital signs collected/recorded due to the encounter was a telemedicine visit.   Location of patient (ex: home, work):  Home  Patient consents to a telephone visit:  Yes  Location of the provider (ex: office, home):  [facility], Office   Name of any referring provider:  N/A  Names of all persons participating in the telemedicine service and their role in the encounter:  S.Chrae B/CMA, [name], NP, and Patient   Time spent on call:  19 min with medical assistant

## 2020-01-12 NOTE — Patient Instructions (Signed)
[name] , Thank you for taking time to come for your Medicare Wellness Visit. I appreciate your ongoing commitment to your health goals. Please review the following plan we discussed and let me know if I can assist you in the future.   Screening recommendations/referrals: Colonoscopy aged out.  Mammogram up to date Bone Density up to date Recommended yearly ophthalmology/optometry visit for glaucoma screening and checkup Recommended yearly dental visit for hygiene and checkup  Vaccinations: Influenza vaccine up to date  Pneumococcal vaccine up to date Tdap vaccine up to date Shingles vaccine up to date    Advanced directives: up to date  Conditions/risks identified: cardiovascular risk factors and obesity based on BMI  Next appointment: 1 year.    Preventive Care 63 Years and Older, Female Preventive care refers to lifestyle choices and visits with your health care provider that can promote health and wellness. What does preventive care include?  A yearly physical exam. This is also called an annual well check.  Dental exams once or twice a year.  Routine eye exams. Ask your health care provider how often you should have your eyes checked.  Personal lifestyle choices, including:  Daily care of your teeth and gums.  Regular physical activity.  Eating a healthy diet.  Avoiding tobacco and drug use.  Limiting alcohol use.  Practicing safe sex.  Taking low-dose aspirin every day.  Taking vitamin and mineral supplements as recommended by your health care provider. What happens during an annual well check? The services and screenings done by your health care provider during your annual well check will depend on your age, overall health, lifestyle risk factors, and family history of disease. Counseling  Your health care provider may ask you questions about your:  Alcohol use.  Tobacco use.  Drug use.  Emotional well-being.  Home and relationship  well-being.  Sexual activity.  Eating habits.  History of falls.  Memory and ability to understand (cognition).  Work and work Statistician.  Reproductive health. Screening  You may have the following tests or measurements:  Height, weight, and BMI.  Blood pressure.  Lipid and cholesterol levels. These may be checked every 5 years, or more frequently if you are over 24 years old.  Skin check.  Lung cancer screening. You may have this screening every year starting at age 54 if you have a 30-pack-year history of smoking and currently smoke or have quit within the past 15 years.  Fecal occult blood test (FOBT) of the stool. You may have this test every year starting at age 2.  Flexible sigmoidoscopy or colonoscopy. You may have a sigmoidoscopy every 5 years or a colonoscopy every 10 years starting at age 24.  Hepatitis C blood test.  Hepatitis B blood test.  Sexually transmitted disease (STD) testing.  Diabetes screening. This is done by checking your blood sugar (glucose) after you have not eaten for a while (fasting). You may have this done every 1-3 years.  Bone density scan. This is done to screen for osteoporosis. You may have this done starting at age 17.  Mammogram. This may be done every 1-2 years. Talk to your health care provider about how often you should have regular mammograms. Talk with your health care provider about your test results, treatment options, and if necessary, the need for more tests. Vaccines  Your health care provider may recommend certain vaccines, such as:  Influenza vaccine. This is recommended every year.  Tetanus, diphtheria, and acellular pertussis (Tdap, Td) vaccine.  You may need a Td booster every 10 years.  Zoster vaccine. You may need this after age 93.  Pneumococcal 13-valent conjugate (PCV13) vaccine. One dose is recommended after age 70.  Pneumococcal polysaccharide (PPSV23) vaccine. One dose is recommended after age  32. Talk to your health care provider about which screenings and vaccines you need and how often you need them. This information is not intended to replace advice given to you by your health care provider. Make sure you discuss any questions you have with your health care provider. Document Released: 01/07/2016 Document Revised: 08/30/2016 Document Reviewed: 10/12/2015 Elsevier Interactive Patient Education  2017 [place] Prevention in the Home Falls can cause injuries. They can happen to people of all ages. There are many things you can do to make your home safe and to help prevent falls. What can I do on the outside of my home?  Regularly fix the edges of walkways and driveways and fix any cracks.  Remove anything that might make you trip as you walk through a door, such as a raised step or threshold.  Trim any bushes or trees on the path to your home.  Use bright outdoor lighting.  Clear any walking paths of anything that might make someone trip, such as rocks or tools.  Regularly check to see if handrails are loose or broken. Make sure that both sides of any steps have handrails.  Any raised decks and porches should have guardrails on the edges.  Have any leaves, snow, or ice cleared regularly.  Use sand or salt on walking paths during winter.  Clean up any spills in your garage right away. This includes oil or grease spills. What can I do in the bathroom?  Use night lights.  Install grab bars by the toilet and in the tub and shower. Do not use towel bars as grab bars.  Use non-skid mats or decals in the tub or shower.  If you need to sit down in the shower, use a plastic, non-slip stool.  Keep the floor dry. Clean up any water that spills on the floor as soon as it happens.  Remove soap buildup in the tub or shower regularly.  Attach bath mats securely with double-sided non-slip rug tape.  Do not have throw rugs and other things on the floor that can make  you trip. What can I do in the bedroom?  Use night lights.  Make sure that you have a light by your bed that is easy to reach.  Do not use any sheets or blankets that are too big for your bed. They should not hang down onto the floor.  Have a firm chair that has side arms. You can use this for support while you get dressed.  Do not have throw rugs and other things on the floor that can make you trip. What can I do in the kitchen?  Clean up any spills right away.  Avoid walking on wet floors.  Keep items that you use a lot in easy-to-reach places.  If you need to reach something above you, use a strong step stool that has a grab bar.  Keep electrical cords out of the way.  Do not use floor polish or wax that makes floors slippery. If you must use wax, use non-skid floor wax.  Do not have throw rugs and other things on the floor that can make you trip. What can I do with my stairs?  Do not leave any  items on the stairs.  Make sure that there are handrails on both sides of the stairs and use them. Fix handrails that are broken or loose. Make sure that handrails are as long as the stairways.  Check any carpeting to make sure that it is firmly attached to the stairs. Fix any carpet that is loose or worn.  Avoid having throw rugs at the top or bottom of the stairs. If you do have throw rugs, attach them to the floor with carpet tape.  Make sure that you have a light switch at the top of the stairs and the bottom of the stairs. If you do not have them, ask someone to add them for you. What else can I do to help prevent falls?  Wear shoes that:  Do not have high heels.  Have rubber bottoms.  Are comfortable and fit you well.  Are closed at the toe. Do not wear sandals.  If you use a stepladder:  Make sure that it is fully opened. Do not climb a closed stepladder.  Make sure that both sides of the stepladder are locked into place.  Ask someone to hold it for you, if  possible.  Clearly mark and make sure that you can see:  Any grab bars or handrails.  First and last steps.  Where the edge of each step is.  Use tools that help you move around (mobility aids) if they are needed. These include:  Canes.  Walkers.  Scooters.  Crutches.  Turn on the lights when you go into a dark area. Replace any light bulbs as soon as they burn out.  Set up your furniture so you have a clear path. Avoid moving your furniture around.  If any of your floors are uneven, fix them.  If there are any pets around you, be aware of where they are.  Review your medicines with your doctor. Some medicines can make you feel dizzy. This can increase your chance of falling. Ask your doctor what other things that you can do to help prevent falls. This information is not intended to replace advice given to you by your health care provider. Make sure you discuss any questions you have with your health care provider. Document Released: 10/07/2009 Document Revised: 05/18/2016 Document Reviewed: 01/15/2015 Elsevier Interactive Patient Education  2017 [employer].

## 2020-01-12 NOTE — Progress Notes (Signed)
Subjective:   [name] is a 86 y.o. female who presents for Medicare Annual (Subsequent) preventive examination.  Review of Systems:   Cardiac Risk Factors include: advanced age (>53men, >70 women);hypertension;dyslipidemia;obesity (BMI >30kg/m2);family history of premature cardiovascular disease     Objective:     Vitals: There were no vitals taken for this visit.  There is no height or weight on file to calculate BMI.  Advanced Directives 01/12/2020 01/13/2019 01/06/2019 05/02/2018 01/02/2018 11/21/2017 08/09/2017  Does Patient Have a Medical Advance Directive? Yes Yes Yes Yes Yes Yes Yes  Type of Arts administrator Power of [place] of [place];Living will Healthcare Power of [place]  Does patient want to make changes to medical advance directive? No - Patient declined No - Patient declined - - - No - Patient declined -  Copy of [place] in Chart? Yes - validated most recent copy scanned in chart (See row information) Yes - validated most recent copy scanned in chart (See row information) - Yes Yes Yes -  Pre-existing out of facility DNR order (yellow form or pink MOST form) - - - - - - -    Tobacco Social History   Tobacco Use  Smoking Status Never Smoker  Smokeless Tobacco Never Used     Counseling given: Not Answered   Clinical Intake:  Pre-visit preparation completed: Yes  Pain : No/denies pain     BMI - recorded: 33.3 Nutritional Status: BMI > 30  Obese Diabetes: No  How often do you need to have someone help you when you read instructions, pamphlets, or other written materials from your doctor or pharmacy?: 1 - Never What is the last grade level you completed in school?: 2 years of college        Past Medical History:  Diagnosis Date  . Allergic rhinitis due to pollen   . Anxiety state,  unspecified    Sitautional- pain  . Arthritis    "right knee" (06/21/2016)  . Asymptomatic varicose veins   . Carpal tunnel syndrome   . Chest pain, unspecified   . Chronic lower back pain    "on the left side" (06/21/2016)  . Constipation   . Cramp of limb   . Depressive disorder, not elsewhere classified   . Diaphragmatic hernia without mention of obstruction or gangrene   . Diaphragmatic hernia without mention of obstruction or gangrene   . Disturbance of skin sensation   . Diverticulosis of colon (without mention of hemorrhage)   . Dysrhythmia    Atrial Flutter  . Enthesopathy of hip region   . Esophageal reflux    , just occasional  . History of blood transfusion    "w/both knee replacements"  . History of duodenal ulcer   . History of kidney stones    passed  . Insomnia, unspecified   . Lumbago   . Migraine    "none since ~ 1990" (06/21/2016)  . Myalgia and myositis, unspecified   . Obesity, unspecified   . Other abnormal blood chemistry   . Other dysphagia   . Other malaise and fatigue   . Other nonspecific abnormal serum enzyme levels   . Other specified cardiac dysrhythmias(427.89)   . Pacemaker   . Pain in joint, ankle and foot   . Pain in joint, hand   . Pain in joint, lower leg   . Pain in  joint, pelvic region and thigh   . Pain in limb   . Plantar fascial fibromatosis   . Presence of permanent cardiac pacemaker    -St Jude  . Reflux esophagitis   . Sciatica   . Spinal stenosis, unspecified region other than cervical   . Stricture and stenosis of esophagus   . Symptomatic menopausal or female climacteric states   . Unspecified essential hypertension   . Unspecified essential hypertension   . Unspecified vitamin D deficiency    Past Surgical History:  Procedure Laterality Date  . BREAST BIOPSY Left 1990s X 2  . CARDIOVERSION N/A 04/26/2016   Procedure: CARDIOVERSION;  Surgeon: Pixie Casino, MD;  Location: [facility];  Service: Cardiovascular;   Laterality: N/A;  . CATARACT EXTRACTION W/ INTRAOCULAR LENS IMPLANT Left 08/03/1999   DR EPES   . CATARACT EXTRACTION W/ INTRAOCULAR LENS IMPLANT Right 2000   DR EPES  . [place]  . COLONOSCOPY  1988   Normal   . DENTAL SURGERY Left 08/2016  . EP IMPLANTABLE DEVICE N/A 06/21/2016   Procedure: Pacemaker Implant;  Surgeon: Evans Lance, MD;  Location: [facility];  Service: Cardiovascular;  Laterality: N/A;  . ESOPHAGOGASTRODUODENOSCOPY (EGD) WITH ESOPHAGEAL DILATION  ~ 1982   Dr. Sharlett Iles  . EXCISION OF ACTINIC KERATOSIS     DR LUPTON   . EYE SURGERY    . INSERT / REPLACE / REMOVE PACEMAKER    . JOINT REPLACEMENT    . KNEE ARTHROSCOPY Left 2003  . KNEE ARTHROSCOPY Right 06-26-13  . KNEE CLOSED REDUCTION Right 10/23/2013   Procedure: CLOSED MANIPULATION RIGHT KNEE;  Surgeon: Mcarthur Rossetti, MD;  Location: [facility];  Service: Orthopedics;  Laterality: Right;  . LASER FOR CLOUDY CAP LEFT EYE Left 03/2006   DR DIGBY  . LUMBAR LAMINECTOMY/DECOMPRESSION MICRODISCECTOMY N/A 06/12/2019   Procedure: Laminectomy and Foraminotomy - Lumbar four-Lumbar five;  Surgeon: Eustace Moore, MD;  Location: [facility];  Service: Neurosurgery;  Laterality: N/A;  . TOTAL KNEE ARTHROPLASTY Left 04/2004   DR RENDALL  . TOTAL KNEE ARTHROPLASTY Right 07/04/2013   Procedure: RIGHT TOTAL KNEE ARTHROPLASTY;  Surgeon: Mcarthur Rossetti, MD;  Location: [facility];  Service: Orthopedics;  Laterality: Right;  . TRIGGER FINGER RELEASE Right 09/2018  . VAGINAL HYSTERECTOMY  1979   Family History  Problem Relation Age of Onset  . Ovarian cancer Mother   . Uterine cancer Mother   . Cerebrovascular Accident Mother   . Heart disease Father   . Hypertension Brother   . Obesity Daughter    Social History   Socioeconomic History  . Marital status: Married    Spouse name: Not on file  . Number of children: Not on file  . Years of education: Not on file  . Highest education  level: Not on file  Occupational History  . Not on file  Tobacco Use  . Smoking status: Never Smoker  . Smokeless tobacco: Never Used  Substance and Sexual Activity  . Alcohol use: No  . Drug use: No  . Sexual activity: Yes  Other Topics Concern  . Not on file  Social History Narrative  . Not on file   Social Determinants of Health   Financial Resource Strain:   . Difficulty of Paying Living Expenses: Not on file  Food Insecurity:   . Worried About Charity fundraiser in the Last Year: Not on file  . Ran Out  of Food in the Last Year: Not on file  Transportation Needs:   . Lack of Transportation (Medical): Not on file  . Lack of Transportation (Non-Medical): Not on file  Physical Activity:   . Days of Exercise per Week: Not on file  . Minutes of Exercise per Session: Not on file  Stress:   . Feeling of Stress : Not on file  Social Connections:   . Frequency of Communication with Friends and Family: Not on file  . Frequency of Social Gatherings with Friends and Family: Not on file  . Attends Religious Services: Not on file  . Active Member of Clubs or Organizations: Not on file  . Attends Archivist Meetings: Not on file  . Marital Status: Not on file    Outpatient Encounter Medications as of 01/12/2020  Medication Sig  . acetaminophen (TYLENOL) 500 MG tablet Take 500-2,000 mg by mouth daily.   Marland Kitchen alendronate (FOSAMAX) 70 MG tablet Take 70 mg by mouth every Saturday. Take with a full glass of water on an empty stomach.   . ALPRAZolam (XANAX) 0.25 MG tablet Take 0.5 tablets (0.125 mg total) by mouth daily as needed for anxiety.  Marland Kitchen antiseptic oral rinse (BIOTENE) LIQD 15 mLs by Mouth Rinse route daily.   Marland Kitchen apixaban (ELIQUIS) 5 MG TABS tablet Take 1 tablet (5 mg total) by mouth 2 (two) times daily.  . Artificial Saliva (ACT DRY MOUTH) LOZG Use as directed 1 drop in the mouth or throat as needed (dry mouth).  . betamethasone dipropionate 0.05 % lotion Apply topically  as needed.  . bimatoprost (LUMIGAN) 0.01 % SOLN Place 1 drop into both eyes at bedtime.   . Calcium Carb-Cholecalciferol (CALTRATE 600+D3) 600-800 MG-UNIT TABS 1 tablet twice daily  . Calcium Carbonate Antacid ([employer] ANTACID SOFT CHEWS ULTR ST) 1177 MG CHEW Chew 1,177 mg by mouth as needed (acid reflux/indigestion.).   Marland Kitchen Cholecalciferol (VITAMIN D) 2000 UNITS tablet Take 2,000 Units by mouth daily.  . dorzolamide (TRUSOPT) 2 % ophthalmic solution Place 1 drop into both eyes 3 (three) times daily.   Marland Kitchen esomeprazole (NEXIUM) 20 MG capsule Take 20 mg by mouth as needed.  . ezetimibe (ZETIA) 10 MG tablet TAKE 1 TABLET ONCE DAILY.  . fluticasone (FLONASE) 50 MCG/ACT nasal spray Place 1 spray into both nostrils as needed for allergies or rhinitis.  . methylcellulose (ARTIFICIAL TEARS) 1 % ophthalmic solution Place 1 drop into both eyes daily as needed (for dry eyes).   . metoprolol tartrate (LOPRESSOR) 50 MG tablet Take 1 tablet (50 mg total) by mouth 2 (two) times daily.  Marland Kitchen nystatin (MYCOSTATIN/NYSTOP) powder Apply 1 application topically as needed.  . nystatin cream (MYCOSTATIN) Apply 1 application topically as needed for dry skin.  Marland Kitchen propafenone (RYTHMOL SR) 225 MG 12 hr capsule TAKE 1 CAPSULE (225 MG TOTAL) BY MOUTH 2 (TWO) TIMES DAILY.  . traZODone (DESYREL) 50 MG tablet Take 1 tablet (50 mg total) by mouth at bedtime.  Loura Pardon Salicylate (ASPERCREME EX) Apply 1 application topically at bedtime as needed (leg pain.).  Marland Kitchen valsartan-hydrochlorothiazide (DIOVAN-HCT) 320-25 MG tablet TAKE 1 TABLET ONCE DAILY.  . [DISCONTINUED] Dentifrices (BIOTENE DRY MOUTH) PSTE Place 1 application onto teeth 2 (two) times a day.  . [DISCONTINUED] ELIQUIS 5 MG TABS tablet Take 5 mg by mouth 2 (two) times daily.   . [DISCONTINUED] esomeprazole (NEXIUM) 20 MG capsule TAKE 1 CAPSULE BY MOUTH EVERY DAY (Patient taking differently: as needed. TAKE 1 CAPSULE BY MOUTH  EVERY DAY)  . [DISCONTINUED] fluticasone (FLONASE) 50  MCG/ACT nasal spray Place 1 spray into both nostrils daily.  . [DISCONTINUED] hydrochlorothiazide (HYDRODIURIL) 25 MG tablet TAKE 1 TABLET BY MOUTH EVERY DAY (Patient not taking: Reported on 01/12/2020)  . [DISCONTINUED] nystatin (MYCOSTATIN/NYSTOP) powder Apply topically 3 (three) times daily.  . [DISCONTINUED] nystatin cream (MYCOSTATIN) Apply 1 application topically 2 (two) times daily.  . [DISCONTINUED] traMADol (ULTRAM) 50 MG tablet Take 1-2 tablets (50-100 mg total) by mouth every 6 (six) hours as needed. (Patient taking differently: Take 25 mg by mouth every 6 (six) hours as needed (pain.). )  . [DISCONTINUED] valsartan (DIOVAN) 320 MG tablet Take 1 tablet (320 mg total) by mouth daily.   No facility-administered encounter medications on file as of 01/12/2020.    Activities of Daily Living In your present state of health, do you have any difficulty performing the following activities: 01/12/2020 06/09/2019  Hearing? N -  Vision? N -  Difficulty concentrating or making decisions? N -  Walking or climbing stairs? Y -  Comment cramps from surgery -  Dressing or bathing? N -  Doing errands, shopping? - N  Conservation officer, nature and eating ? N -  Using the Toilet? N -  In the past six months, have you accidently leaked urine? Y -  Do you have problems with loss of bowel control? N -  Managing your Medications? N -  Managing your Finances? N -  Housekeeping or managing your Housekeeping? N -  Some recent data might be hidden    Patient Care Team: Lauree Chandler, NP as PCP - General (Geriatric Medicine) [place], Peter M, MD as PCP - Cardiology (Cardiology) Mcarthur Rossetti, MD as Attending Physician (Orthopedic Surgery) Larey Dresser, MD as Attending Physician (Cardiology) Thornell Sartorius, MD as Consulting Physician (Otolaryngology) Druscilla Brownie, MD as Consulting Physician (Dermatology) Calvert Cantor, MD as Consulting Physician (Ophthalmology) Debara Pickett Nadean Corwin, MD as  Consulting Physician (Cardiology) Eustace Moore, MD as Consulting Physician (Neurosurgery)    Assessment:   This is a routine wellness examination for [place].  Exercise Activities and Dietary recommendations Current Exercise Habits: Home exercise routine, Type of exercise: walking, Time (Minutes): 20, Frequency (Times/Week): 7, Weekly Exercise (Minutes/Week): 140  Goals    . DIET - INCREASE WATER INTAKE     Patient will increase water by 1-2 cups    . Increase water intake     Starting 12/12/16, I will attempt to increase my water intake to 5 glasses per day.     . Weight (lb) < 175 lb (79.4 kg) (pt-stated)       Fall Risk Fall Risk  01/12/2020 01/13/2019 01/06/2019 10/02/2018 05/02/2018  Falls in the past year? 0 0 0 No No  Number falls in past yr: 0 0 0 - -  Comment - - - - -  Injury with Fall? 0 0 0 - -  Comment - - - - -  Risk Factor Category  - - - - -  Risk for fall due to : - - - - -  Follow up - - - - -  Comment - - - - -   Is the patient's home free of loose throw rugs in walkways, pet beds, electrical cords, etc?   yes      Grab bars in the bathroom? no      Handrails on the stairs?   yes      Adequate lighting?   yes  Timed Get  Up and Go performed: na  Depression Screen PHQ 2/9 Scores 01/12/2020 01/06/2019 01/02/2018 11/21/2017  PHQ - 2 Score 0 0 0 0     Cognitive Function MMSE - Mini Mental State Exam 01/06/2019 11/21/2017 12/12/2016 11/30/2015  Orientation to time 5 5 5 5   Orientation to Place 5 5 5 5   Registration 3 3 3 3   Attention/ Calculation 5 5 5 5   Recall 3 3 3 3   Language- name 2 objects 2 2 2 2   Language- repeat 1 1 1 1   Language- follow 3 step command 3 3 3 3   Language- read & follow direction 1 1 1 1   Write a sentence 1 1 1 1   Copy design 1 1 1 1   Total score 30 30 30 30      6CIT Screen 01/12/2020  What Year? 0 points  What month? 0 points  What time? 0 points  Count back from 20 0 points  Months in reverse 0 points  Repeat phrase 0 points   Total Score 0    Immunization History  Administered Date(s) Administered  . Fluad Quad(high Dose 65+) 09/29/2019  . Influenza, High Dose Seasonal PF 09/14/2017, 10/02/2018  . Influenza,inj,Quad PF,6+ Mos 09/23/2013, 09/25/2014, 10/11/2015, 08/30/2016  . Pneumococcal Conjugate-13 02/17/2015  . Pneumococcal Polysaccharide-23 12/12/2016  . Tdap 06/24/2017  . Zoster 01/29/2008  . Zoster Recombinat (Shingrix) 09/04/2017, 02/21/2018    Qualifies for Shingles Vaccine? completed  Screening Tests Health Maintenance  Topic Date Due  . MAMMOGRAM  09/29/2020  . TETANUS/TDAP  06/25/2027  . INFLUENZA VACCINE  Completed  . DEXA SCAN  Completed  . PNA vac Low Risk Adult  Completed    Cancer Screenings: Lung: Low Dose CT Chest recommended if Age 73-80 years, 30 pack-year currently smoking OR have quit w/in 15years. Patient does not qualify. Breast:  Up to date on Mammogram? Yes   Up to date of Bone Density/Dexa? Yes Colorectal: aged  Additional Screenings:  Hepatitis C Screening: na     Plan:      I have personally reviewed and noted the following in the patient's chart:   . Medical and social history . Use of alcohol, tobacco or illicit drugs  . Current medications and supplements . Functional ability and status . Nutritional status . Physical activity . Advanced directives . List of other physicians . Hospitalizations, surgeries, and ER visits in previous 12 months . Vitals . Screenings to include cognitive, depression, and falls . Referrals and appointments  In addition, I have reviewed and discussed with patient certain preventive protocols, quality metrics, and best practice recommendations. A written personalized care plan for preventive services as well as general preventive health recommendations were provided to patient.     Lauree Chandler, NP  01/12/2020

## 2020-01-16 ENCOUNTER — Ambulatory Visit: Payer: Medicare Other | Attending: Internal Medicine

## 2020-01-16 DIAGNOSIS — Z23 Encounter for immunization: Secondary | ICD-10-CM | POA: Insufficient documentation

## 2020-01-16 NOTE — Progress Notes (Signed)
   Covid-19 Vaccination Clinic  Name:  [name]    MRN: [id number] DOB: Nov 23, 1931  01/16/2020  [name] was observed post Covid-19 immunization for 15 minutes without incidence. She was provided with Vaccine Information Sheet and instruction to access the V-Safe system.   [name] was instructed to call 911 with any severe reactions post vaccine: Marland Kitchen Difficulty breathing  . Swelling of your face and throat  . A fast heartbeat  . A bad rash all over your body  . Dizziness and weakness    Immunizations Administered    Name Date Dose VIS Date Route   Pfizer COVID-19 Vaccine 01/16/2020 11:11 AM 0.3 mL 12/05/2019 Intramuscular   Manufacturer: [place]   Lot: [id number]   [place]: [id number]    .

## 2020-01-17 ENCOUNTER — Other Ambulatory Visit: Payer: Self-pay | Admitting: Nurse Practitioner

## 2020-01-19 NOTE — Telephone Encounter (Signed)
High risk or very high risk warning populated when attempting to refill medication. RX request sent to PCP for review and approval if warranted.   

## 2020-01-20 ENCOUNTER — Other Ambulatory Visit: Payer: Self-pay | Admitting: Nurse Practitioner

## 2020-01-20 DIAGNOSIS — M8580 Other specified disorders of bone density and structure, unspecified site: Secondary | ICD-10-CM

## 2020-01-20 NOTE — Telephone Encounter (Signed)
High risk or very high risk warning populated when attempting to refill medication. RX request sent to PCP for review and approval if warranted.   

## 2020-01-28 ENCOUNTER — Other Ambulatory Visit: Payer: Self-pay | Admitting: Nurse Practitioner

## 2020-02-06 ENCOUNTER — Ambulatory Visit: Payer: Medicare Other | Attending: Internal Medicine

## 2020-02-06 DIAGNOSIS — Z23 Encounter for immunization: Secondary | ICD-10-CM

## 2020-02-06 NOTE — Progress Notes (Signed)
   Covid-19 Vaccination Clinic  Name:  [name]    MRN: [id number] DOB: 1931-03-12  02/06/2020  [name] was observed post Covid-19 immunization for 30 minutes based on pre-vaccination screening without incidence. She was provided with Vaccine Information Sheet and instruction to access the V-Safe system.   [name] was instructed to call 911 with any severe reactions post vaccine: Marland Kitchen Difficulty breathing  . Swelling of your face and throat  . A fast heartbeat  . A bad rash all over your body  . Dizziness and weakness    Immunizations Administered    Name Date Dose VIS Date Route   Pfizer COVID-19 Vaccine 02/06/2020  3:39 PM 0.3 mL 12/05/2019 Intramuscular   Manufacturer: [place]   Lot: [id number]   [place]: [id number]

## 2020-02-16 ENCOUNTER — Other Ambulatory Visit: Payer: Self-pay | Admitting: Cardiology

## 2020-02-18 ENCOUNTER — Encounter: Payer: Self-pay | Admitting: Physician Assistant

## 2020-02-18 ENCOUNTER — Ambulatory Visit: Payer: Medicare Other | Admitting: Physician Assistant

## 2020-02-18 ENCOUNTER — Other Ambulatory Visit: Payer: Self-pay

## 2020-02-18 ENCOUNTER — Ambulatory Visit: Payer: Self-pay

## 2020-02-18 DIAGNOSIS — M545 Low back pain, unspecified: Secondary | ICD-10-CM

## 2020-02-18 MED ORDER — METHYLPREDNISOLONE 4 MG PO TABS: ORAL_TABLET | ORAL | 0 refills | Status: DC

## 2020-02-18 MED ORDER — TIZANIDINE HCL 2 MG PO CAPS: 2.0000 mg | ORAL_CAPSULE | Freq: Every day | ORAL | 0 refills | Status: DC

## 2020-02-18 NOTE — Progress Notes (Signed)
Office Visit Note   Patient: [name]           Date of Birth: 03-Oct-1931           MRN: [id number] Visit Date: 02/18/2020              Requested by: Lauree Chandler, NP [place],   [id number] PCP: Lauree Chandler, NP   Assessment & Plan: Visit Diagnoses:  1. Low back pain, unspecified back pain laterality, unspecified chronicity, unspecified whether sciatica present     Plan:  We will send her to physical therapy for back for back exercises modalities.  If her pain continues refer back to Dr. Ronnald Ramp.  Questions were encouraged and answered at length today.  Follow-Up Instructions: Return if symptoms worsen or fail to improve.    Orders:  Orders Placed This Encounter  Procedures  . XR Lumbar Spine 2-3 Views   Meds ordered this encounter  Medications  . methylPREDNISolone (MEDROL) 4 MG tablet    Sig: Take as directed    Dispense:  21 tablet    Refill:  0  . tizanidine (ZANAFLEX) 2 MG capsule    Sig: Take 1 capsule (2 mg total) by mouth at bedtime.    Dispense:  15 capsule    Refill:  0      Procedures: No procedures performed   Clinical Data: No additional findings.   Subjective: Chief Complaint  Patient presents with  . Lower Back - Pain    HPI  [name] comes in today due to back pain.  We have seen her for this in the past.  She actually was referred to Dr. Ronnald Ramp and in June 2020 underwent decompression lumbar laminectomy medial facet ectomy foraminotomy at L4-5.  And did well until about 10 days ago she developed low back pain.  Pain with bending.  She does it more as spasm.  No radicular symptoms down either leg.  Pain is mostly at the level of her lower lumbar left paraspinous region.  Said no bowel bladder dysfunction.  No waking pain.  Review of Systems Negative for fevers or chills.  Please see HPI  Objective: Vital Signs: There were no vitals taken for this visit.  Physical Exam Constitutional:    Appearance: She is not ill-appearing or diaphoretic.  Pulmonary:     Effort: Pulmonary effort is normal.  Neurological:     Mental Status: She is alert and oriented to person, place, and time.  Psychiatric:        Mood and Affect: Mood normal.        Behavior: Behavior normal.     Ortho Exam Lower extremities 5 out of 5 strength throughout.  Negative straight leg raise bilaterally.  Good range of motion of bilateral hips without pain.  Nontender over the trochanteric region of both hips.  Slight tenderness lower lumbar left paraspinous region. Specialty Comments:  No specialty comments available.  Imaging: XR Lumbar Spine 2-3 Views  Result Date: 02/18/2020 2 views lumbar spine: No acute findings no acute fracture.  Disc space overall well-maintained.  Arthrosclerotic changes of the aorta noted.  No spondylolisthesis.  Normal lordotic curvature    PMFS History: Patient Active Problem List   Diagnosis Date Noted  . Sinus node dysfunction ([place]) 10/29/2019  . S/P lumbar laminectomy 06/12/2019  . Osteoporosis 01/28/2019  . Trochanteric bursitis, right hip 05/16/2017  . Cardiac pacemaker in situ 06/21/2016  . Atypical atrial flutter ([place])   .  Chest pain 04/10/2016  . Atrial fibrillation ([place]) 03/29/2016  . Hyperglycemia 02/17/2015  . Obese 02/17/2015  . Insomnia 01/27/2014  . 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 Knee joint replacement by other means 10/01/2013  . Arthritis of knee, right 07/04/2013  . Osteoarthritis of right knee 07/01/2013  . Cervicalgia 07/01/2013  . Bradycardia 05/13/2013  . HYPERCHOLESTEROLEMIA 06/02/2009  . Anxiety state 06/02/2009  . Essential hypertension 06/02/2009  . ESOPHAGEAL STRICTURE 06/02/2009  . GERD 06/02/2009  . HIATAL HERNIA 06/02/2009  . DIVERTICULOSIS, COLON 06/02/2009  . Dysphagia, pharyngoesophageal phase 06/02/2009  . Backache 05/01/2007  . LEG PAIN, RIGHT 05/01/2007   Past Medical History:  Diagnosis Date  . Allergic  rhinitis due to pollen   . Anxiety state, unspecified    Sitautional- pain  . Arthritis    "right knee" (06/21/2016)  . Asymptomatic varicose veins   . Carpal tunnel syndrome   . Chest pain, unspecified   . Chronic lower back pain    "on the left side" (06/21/2016)  . Constipation   . Cramp of limb   . Depressive disorder, not elsewhere classified   . Diaphragmatic hernia without mention of obstruction or gangrene   . Diaphragmatic hernia without mention of obstruction or gangrene   . Disturbance of skin sensation   . Diverticulosis of colon (without mention of hemorrhage)   . Dysrhythmia    Atrial Flutter  . Enthesopathy of hip region   . Esophageal reflux    , just occasional  . History of blood transfusion    "w/both knee replacements"  . History of duodenal ulcer   . History of kidney stones    passed  . Insomnia, unspecified   . Lumbago   . Migraine    "none since ~ 1990" (06/21/2016)  . Myalgia and myositis, unspecified   . Obesity, unspecified   . Other abnormal blood chemistry   . Other dysphagia   . Other malaise and fatigue   . Other nonspecific abnormal serum enzyme levels   . Other specified cardiac dysrhythmias(427.89)   . Pacemaker   . Pain in joint, ankle and foot   . Pain in joint, hand   . Pain in joint, lower leg   . Pain in joint, pelvic region and thigh   . Pain in limb   . Plantar fascial fibromatosis   . Presence of permanent cardiac pacemaker    -St Jude  . Reflux esophagitis   . Sciatica   . Spinal stenosis, unspecified region other than cervical   . Stricture and stenosis of esophagus   . Symptomatic menopausal or female climacteric states   . Unspecified essential hypertension   . Unspecified essential hypertension   . Unspecified vitamin D deficiency     Family History  Problem Relation Age of Onset  . Ovarian cancer Mother   . Uterine cancer Mother   . Cerebrovascular Accident Mother   . Heart disease Father   . Hypertension  Brother   . Obesity Daughter     Past Surgical History:  Procedure Laterality Date  . BREAST BIOPSY Left 1990s X 2  . CARDIOVERSION N/A 04/26/2016   Procedure: CARDIOVERSION;  Surgeon: Pixie Casino, MD;  Location: [facility];  Service: Cardiovascular;  Laterality: N/A;  . CATARACT EXTRACTION W/ INTRAOCULAR LENS IMPLANT Left 08/03/1999   DR EPES   . CATARACT EXTRACTION W/ INTRAOCULAR LENS IMPLANT Right 2000   DR EPES  . [place]  .  COLONOSCOPY  1988   Normal   . DENTAL SURGERY Left 08/2016  . EP IMPLANTABLE DEVICE N/A 06/21/2016   Procedure: Pacemaker Implant;  Surgeon: Evans Lance, MD;  Location: [facility];  Service: Cardiovascular;  Laterality: N/A;  . ESOPHAGOGASTRODUODENOSCOPY (EGD) WITH ESOPHAGEAL DILATION  ~ 1982   Dr. Sharlett Iles  . EXCISION OF ACTINIC KERATOSIS     DR LUPTON   . EYE SURGERY    . INSERT / REPLACE / REMOVE PACEMAKER    . JOINT REPLACEMENT    . KNEE ARTHROSCOPY Left 2003  . KNEE ARTHROSCOPY Right 06-26-13  . KNEE CLOSED REDUCTION Right 10/23/2013   Procedure: CLOSED MANIPULATION RIGHT KNEE;  Surgeon: Mcarthur Rossetti, MD;  Location: [facility];  Service: Orthopedics;  Laterality: Right;  . LASER FOR CLOUDY CAP LEFT EYE Left 03/2006   DR DIGBY  . LUMBAR LAMINECTOMY/DECOMPRESSION MICRODISCECTOMY N/A 06/12/2019   Procedure: Laminectomy and Foraminotomy - Lumbar four-Lumbar five;  Surgeon: Eustace Moore, MD;  Location: [facility];  Service: Neurosurgery;  Laterality: N/A;  . TOTAL KNEE ARTHROPLASTY Left 04/2004   DR RENDALL  . TOTAL KNEE ARTHROPLASTY Right 07/04/2013   Procedure: RIGHT TOTAL KNEE ARTHROPLASTY;  Surgeon: Mcarthur Rossetti, MD;  Location: [facility];  Service: Orthopedics;  Laterality: Right;  . TRIGGER FINGER RELEASE Right 09/2018  . VAGINAL HYSTERECTOMY  1979   Social History   Occupational History  . Not on file  Tobacco Use  . Smoking status: Never Smoker  . Smokeless tobacco: Never Used  Substance  and Sexual Activity  . Alcohol use: No  . Drug use: No  . Sexual activity: Yes

## 2020-03-15 NOTE — Progress Notes (Signed)
Cardiology Office Note   Date:  03/18/2020   ID:  [name], [name], [name], MRN [id number]  PCP:  Lauree Chandler, NP  Cardiologist:   Joscelyn Hardrick Martinique, MD   Chief Complaint  Patient presents with  . Atrial Fibrillation      History of Present Illness: [name] is a 86 y.o. female is seen for follow up Afib. She has a long history of marked sinus bradycardia and atrial fibrillation/flutter. She was seen initially in 2014. Echo at that time showed mild LAE otherwise normal. Holter showed mean HR 59 with lowest HR 43 and peak HR 109.    On March 9,2017 she noted an increased HR by BP monitor up to 153. At this time she felt marked indigestion from her waist to her neck. She felt her heart fluttering.   She was seen by Dr. Nyoka Cowden on 03/29/16 and found to be in Afib with RVR. She was started on Eliquis and metoprolol. Myoview study and Echo were normal.   She later had attempt at DCCV. She was in an atypical atrial flutter at that time. DCCV resulted in very prolonged pauses > 6 seconds and return to flutter. She was seen by Dr. Curt Bears and placed on flecainide. This did convert her to NSR but made her feel very sick with nausea, dizziness, and extreme fatigue. Flecainide was discontinued.. She continued to have marked bradycardia and underwent PPM placement on 06/21/16. When seen in September 2018 by Dr. Lovena Le she had an Afib burden of 94%. She was started on propafenone for her Afib. Initially this medication caused her to have more nausea but this has improved.  On her pacemaker checks her Afib burden is less than 1% since December 2019. Last pacemaker check in Dec. Was normal.  In June 2020 she underwent L4-5 lumbar laminectomy by Dr Ronnald Ramp. This went well. She just notices a little numbness in her leg now.  She states she is doing very well. No chest pain, dyspnea, edema. Notes sometimes in her recliner after dinner she will feel a little racing but this doesn't really correlate  with her pacemaker check.   Past Medical History:  Diagnosis Date  . Allergic rhinitis due to pollen   . Anxiety state, unspecified    Sitautional- pain  . Arthritis    "right knee" (06/21/2016)  . Asymptomatic varicose veins   . Carpal tunnel syndrome   . Chest pain, unspecified   . Chronic lower back pain    "on the left side" (06/21/2016)  . Constipation   . Cramp of limb   . Depressive disorder, not elsewhere classified   . Diaphragmatic hernia without mention of obstruction or gangrene   . Diaphragmatic hernia without mention of obstruction or gangrene   . Disturbance of skin sensation   . Diverticulosis of colon (without mention of hemorrhage)   . Dysrhythmia    Atrial Flutter  . Enthesopathy of hip region   . Esophageal reflux    , just occasional  . History of blood transfusion    "w/both knee replacements"  . History of duodenal ulcer   . History of kidney stones    passed  . Insomnia, unspecified   . Lumbago   . Migraine    "none since ~ 1990" (06/21/2016)  . Myalgia and myositis, unspecified   . Obesity, unspecified   . Other abnormal blood chemistry   . Other dysphagia   . Other malaise and fatigue   .  Other nonspecific abnormal serum enzyme levels   . Other specified cardiac dysrhythmias(427.89)   . Pacemaker   . Pain in joint, ankle and foot   . Pain in joint, hand   . Pain in joint, lower leg   . Pain in joint, pelvic region and thigh   . Pain in limb   . Plantar fascial fibromatosis   . Presence of permanent cardiac pacemaker    -St Jude  . Reflux esophagitis   . Sciatica   . Spinal stenosis, unspecified region other than cervical   . Stricture and stenosis of esophagus   . Symptomatic menopausal or female climacteric states   . Unspecified essential hypertension   . Unspecified essential hypertension   . Unspecified vitamin D deficiency     Past Surgical History:  Procedure Laterality Date  . BREAST BIOPSY Left 1990s X 2  . CARDIOVERSION  N/A 04/26/2016   Procedure: CARDIOVERSION;  Surgeon: Pixie Casino, MD;  Location: [facility];  Service: Cardiovascular;  Laterality: N/A;  . CATARACT EXTRACTION W/ INTRAOCULAR LENS IMPLANT Left 08/03/1999   DR EPES   . CATARACT EXTRACTION W/ INTRAOCULAR LENS IMPLANT Right 2000   DR EPES  . [place]  . COLONOSCOPY  1988   Normal   . DENTAL SURGERY Left 08/2016  . EP IMPLANTABLE DEVICE N/A 06/21/2016   Procedure: Pacemaker Implant;  Surgeon: Evans Lance, MD;  Location: [facility];  Service: Cardiovascular;  Laterality: N/A;  . ESOPHAGOGASTRODUODENOSCOPY (EGD) WITH ESOPHAGEAL DILATION  ~ 1982   Dr. Sharlett Iles  . EXCISION OF ACTINIC KERATOSIS     DR LUPTON   . EYE SURGERY    . INSERT / REPLACE / REMOVE PACEMAKER    . JOINT REPLACEMENT    . KNEE ARTHROSCOPY Left 2003  . KNEE ARTHROSCOPY Right 06-26-13  . KNEE CLOSED REDUCTION Right 10/23/2013   Procedure: CLOSED MANIPULATION RIGHT KNEE;  Surgeon: Mcarthur Rossetti, MD;  Location: [facility];  Service: Orthopedics;  Laterality: Right;  . LASER FOR CLOUDY CAP LEFT EYE Left 03/2006   DR DIGBY  . LUMBAR LAMINECTOMY/DECOMPRESSION MICRODISCECTOMY N/A 06/12/2019   Procedure: Laminectomy and Foraminotomy - Lumbar four-Lumbar five;  Surgeon: Eustace Moore, MD;  Location: [facility];  Service: Neurosurgery;  Laterality: N/A;  . TOTAL KNEE ARTHROPLASTY Left 04/2004   DR RENDALL  . TOTAL KNEE ARTHROPLASTY Right 07/04/2013   Procedure: RIGHT TOTAL KNEE ARTHROPLASTY;  Surgeon: Mcarthur Rossetti, MD;  [facility];  Service: Orthopedics;  Laterality: Right;  . TRIGGER FINGER RELEASE Right 09/2018  . VAGINAL HYSTERECTOMY  1979     Current Outpatient Medications  Medication Sig Dispense Refill  . acetaminophen (TYLENOL) 500 MG tablet Take 500-2,000 mg by mouth daily.     Marland Kitchen alendronate (FOSAMAX) 70 MG tablet TAKE 1 TABLET BY MOUTH EVERY 7 (SEVEN) DAYS. TAKE WITH A FULL GLASS OF WATER ON AN EMPTY STOMACH. 12  tablet 3  . ALPRAZolam (XANAX) 0.25 MG tablet Take 0.5 tablets (0.125 mg total) by mouth daily as needed for anxiety. 30 tablet 1  . antiseptic oral rinse (BIOTENE) LIQD 15 mLs by Mouth Rinse route daily.     Marland Kitchen apixaban (ELIQUIS) 5 MG TABS tablet Take 1 tablet (5 mg total) by mouth 2 (two) times daily. 180 tablet 3  . Artificial Saliva (ACT DRY MOUTH) LOZG Use as directed 1 drop in the mouth or throat as needed (dry mouth).    . betamethasone  dipropionate 0.05 % lotion Apply topically as needed.    . bimatoprost (LUMIGAN) 0.01 % SOLN Place 1 drop into both eyes at bedtime.     . Calcium Carb-Cholecalciferol (CALTRATE 600+D3) 600-800 MG-UNIT TABS 1 tablet twice daily 60 tablet 0  . Calcium Carbonate Antacid ([employer] ANTACID SOFT CHEWS ULTR ST) 1177 MG CHEW Chew 1,177 mg by mouth as needed (acid reflux/indigestion.).     Marland Kitchen Cholecalciferol (VITAMIN D) 2000 UNITS tablet Take 2,000 Units by mouth daily.    . dorzolamide (TRUSOPT) 2 % ophthalmic solution Place 1 drop into both eyes 3 (three) times daily.     Marland Kitchen esomeprazole (NEXIUM) [name] MG capsule TAKE 1 CAPSULE BY MOUTH EVERY DAY 90 capsule 1  . ezetimibe (ZETIA) 10 MG tablet TAKE 1 TABLET ONCE DAILY. 90 tablet 1  . fluticasone (FLONASE) 50 MCG/ACT nasal spray Place 1 spray into both nostrils as needed for allergies or rhinitis.    . methylcellulose (ARTIFICIAL TEARS) 1 % ophthalmic solution Place 1 drop into both eyes daily as needed (for dry eyes).     . methylPREDNISolone (MEDROL) 4 MG tablet Take as directed 21 tablet 0  . metoprolol tartrate (LOPRESSOR) 50 MG tablet Take 1 tablet (50 mg total) by mouth 2 (two) times daily. 180 tablet 2  . nystatin (MYCOSTATIN/NYSTOP) powder Apply 1 application topically as needed.    . nystatin cream (MYCOSTATIN) Apply 1 application topically as needed for dry skin.    Marland Kitchen propafenone (RYTHMOL SR) 225 MG 12 hr capsule TAKE 1 CAPSULE (225 MG TOTAL) BY MOUTH 2 (TWO) TIMES DAILY. 180 capsule 1  . tizanidine (ZANAFLEX) 2 MG  capsule Take 1 capsule (2 mg total) by mouth at bedtime. 15 capsule 0  . traZODone (DESYREL) 50 MG tablet Take 1 tablet (50 mg total) by mouth at bedtime. 30 tablet 3  . Trolamine Salicylate (ASPERCREME EX) Apply 1 application topically at bedtime as needed (leg pain.).    Marland Kitchen valsartan-hydrochlorothiazide (DIOVAN-HCT) 320-25 MG tablet TAKE 1 TABLET ONCE DAILY. 90 tablet 1   No current facility-administered medications for this visit.    Allergies:   Advil [ibuprofen], Aleve [naproxen sodium], Aspirin, Atorvastatin, Celebrex [celecoxib], Diclofenac, Doxycycline, Fosamax [alendronate sodium], Metoclopramide hcl, Nsaids, Other, Pravastatin, and Timolol    Social History:  The patient  reports that she has never smoked. She has never used smokeless tobacco. She reports that she does not drink alcohol or use drugs.   Family History:  The patient's family history includes Cerebrovascular Accident in her mother; Heart disease in her father; Hypertension in her brother; Obesity in her daughter; Ovarian cancer in her mother; Uterine cancer in her mother.    ROS:  Please see the history of present illness.   Otherwise, review of systems are positive for none.   All other systems are reviewed and negative.    PHYSICAL EXAM: VS:  BP 130/71   Pulse 68   Temp (!) 96.9 F (36.1 C)   Ht 5\' 3"  (1.6 m)   Wt 188 lb 3.2 oz (85.4 kg)   SpO2 96%   BMI 33.34 kg/m  , BMI Body mass index is 33.34 kg/m. GENERAL:  Well appearing, obese WF in NAD HEENT:  PERRL, EOMI, sclera are clear. Oropharynx is clear. NECK:  No jugular venous distention, carotid upstroke brisk and symmetric, no bruits, no thyromegaly or adenopathy LUNGS:  Clear to auscultation bilaterally CHEST:  Unremarkable HEART:  RRR,  PMI not displaced or sustained,S1 and S2 within normal limits, no  S3, no S4: no clicks, no rubs, no murmurs ABD:  Soft, nontender. BS +, no masses or bruits. No hepatomegaly, no splenomegaly EXT:  2 + pulses  throughout, no edema, no cyanosis no clubbing SKIN:  Warm and dry.  No rashes NEURO:  Alert and oriented x 3. Cranial nerves II through XII intact. PSYCH:  Cognitively intact    EKG:  EKG is  Not ordered today.   Recent Labs: 09/29/2019: ALT [name]; BUN 18; Creat 0.73; Hemoglobin 12.4; Platelets 169; Potassium 3.5; Sodium 140    Lipid Panel    Component Value Date/Time   CHOL 142 09/29/2019 0928   CHOL 179 03/27/2016 0853   TRIG 158 (H) 09/29/2019 0928   HDL 39 (L) 09/29/2019 0928   HDL 42 03/27/2016 0853   CHOLHDL 3.6 09/29/2019 0928   VLDL 42 (H) 08/06/2017 0918   LDLCALC 78 09/29/2019 0928      Wt Readings from Last 3 Encounters:  03/18/20 188 lb 3.2 oz (85.4 kg)  11/04/[name] 188 lb (85.3 kg)  10/09/[name] 187 lb 3.2 oz (84.9 kg)      Other studies Reviewed: Additional studies/ records that were reviewed today include:  Echo: 09/06/17: Study Conclusions  - Left ventricle: The cavity size was normal. Wall thickness was   increased in a pattern of mild LVH. Systolic function was normal.   The estimated ejection fraction was in the range of 55% to 60%.   Doppler parameters are consistent with both elevated ventricular   end-diastolic filling pressure and elevated left atrial filling   pressure. - Left atrium: The atrium was moderately dilated. - Atrial septum: There was increased thickness of the septum,   consistent with lipomatous hypertrophy. No defect or patent   foramen ovale was identified  ASSESSMENT AND PLAN:  1.   Atrial fibrillation/flutter with RVR.  Intolerant of flecainide.  Now s/p PPM placement for marked post conversion pauses. On propafenone with marked improvement in Afib burden. Will continue same. Continue Eliquis for anticoagulation.   2. HTN well controlled.  3. Mild hypercholesterolemia.   Disposition: follow up with me in 6 months.   Signed, Jerard Bays Martinique, MD,FACC  03/18/2020 [facility] [address]  [place], [place], [address] Phone [phone], Fax [phone]

## 2020-03-17 ENCOUNTER — Other Ambulatory Visit: Payer: Self-pay | Admitting: Nurse Practitioner

## 2020-03-17 DIAGNOSIS — K219 Gastro-esophageal reflux disease without esophagitis: Secondary | ICD-10-CM

## 2020-03-18 ENCOUNTER — Encounter: Payer: Self-pay | Admitting: Cardiology

## 2020-03-18 ENCOUNTER — Ambulatory Visit: Payer: Medicare Other | Admitting: Cardiology

## 2020-03-18 ENCOUNTER — Other Ambulatory Visit: Payer: Self-pay

## 2020-03-18 VITALS — BP 130/71 | HR 68 | Temp 96.9°F | Ht 63.0 in | Wt 188.2 lb

## 2020-03-18 DIAGNOSIS — E7800 Pure hypercholesterolemia, unspecified: Secondary | ICD-10-CM

## 2020-03-18 DIAGNOSIS — I495 Sick sinus syndrome: Secondary | ICD-10-CM | POA: Diagnosis not present

## 2020-03-18 DIAGNOSIS — I480 Paroxysmal atrial fibrillation: Secondary | ICD-10-CM

## 2020-03-23 ENCOUNTER — Ambulatory Visit (INDEPENDENT_AMBULATORY_CARE_PROVIDER_SITE_OTHER): Payer: Medicare Other | Admitting: *Deleted

## 2020-03-23 DIAGNOSIS — I480 Paroxysmal atrial fibrillation: Secondary | ICD-10-CM

## 2020-03-24 LAB — CUP PACEART REMOTE DEVICE CHECK
Battery Remaining Longevity: 124 mo
Battery Remaining Percentage: 95.5 %
Battery Voltage: 2.98 V
Brady Statistic AP VP Percent: 6.2 %
Brady Statistic AP VS Percent: 92 %
Brady Statistic AS VP Percent: 1 %
Brady Statistic AS VS Percent: 1.3 %
Brady Statistic RA Percent Paced: 98 %
Brady Statistic RV Percent Paced: 6.2 %
Date Time Interrogation Session: 20210330170938
Implantable Lead Implant Date: 20170628
Implantable Lead Implant Date: 20170628
Implantable Lead Location: 753859
Implantable Lead Location: 753860
Implantable Pulse Generator Implant Date: 20170628
Lead Channel Impedance Value: 540 Ohm
Lead Channel Impedance Value: 600 Ohm
Lead Channel Pacing Threshold Amplitude: 0.75 V
Lead Channel Pacing Threshold Amplitude: 1.25 V
Lead Channel Pacing Threshold Pulse Width: 0.4 ms
Lead Channel Pacing Threshold Pulse Width: 0.4 ms
Lead Channel Sensing Intrinsic Amplitude: 1.8 mV
Lead Channel Sensing Intrinsic Amplitude: 9.2 mV
Lead Channel Setting Pacing Amplitude: 2 V
Lead Channel Setting Pacing Amplitude: 2.5 V
Lead Channel Setting Pacing Pulse Width: 0.4 ms
Lead Channel Setting Sensing Sensitivity: 2 mV
Pulse Gen Model: 2272
Pulse Gen Serial Number: 7910313

## 2020-03-24 NOTE — Progress Notes (Signed)
PPM Remote  

## 2020-03-30 ENCOUNTER — Other Ambulatory Visit: Payer: Self-pay

## 2020-03-30 ENCOUNTER — Other Ambulatory Visit: Payer: Medicare Other

## 2020-03-30 DIAGNOSIS — I480 Paroxysmal atrial fibrillation: Secondary | ICD-10-CM

## 2020-03-30 DIAGNOSIS — E785 Hyperlipidemia, unspecified: Secondary | ICD-10-CM

## 2020-03-30 LAB — CBC WITH DIFFERENTIAL/PLATELET
Absolute Monocytes: 410 cells/uL (ref 200–950)
Basophils Absolute: 41 cells/uL (ref 0–200)
Basophils Relative: 0.9 %
Eosinophils Absolute: 131 cells/uL (ref 15–500)
Eosinophils Relative: 2.9 %
HCT: 39.1 % (ref 35.0–45.0)
Hemoglobin: 12.8 g/dL (ref 11.7–15.5)
Lymphs Abs: 1818 cells/uL (ref 850–3900)
MCH: 30.3 pg (ref 27.0–33.0)
MCHC: 32.7 g/dL (ref 32.0–36.0)
MCV: 92.7 fL (ref 80.0–100.0)
MPV: 9.8 fL (ref 7.5–12.5)
Monocytes Relative: 9.1 %
Neutro Abs: 2102 cells/uL (ref 1500–7800)
Neutrophils Relative %: 46.7 %
Platelets: 179 10*3/uL (ref 140–400)
RBC: 4.22 10*6/uL (ref 3.80–5.10)
RDW: 12.9 % (ref 11.0–15.0)
Total Lymphocyte: 40.4 %
WBC: 4.5 10*3/uL (ref 3.8–10.8)

## 2020-03-30 LAB — LIPID PANEL
Cholesterol: 153 mg/dL (ref <200)
HDL: 39 mg/dL — ABNORMAL LOW (ref >=50)
LDL Cholesterol (Calc): 86 mg/dL (calc)
Non-HDL Cholesterol (Calc): 114 mg/dL (calc) (ref <130)
Total CHOL/HDL Ratio: 3.9 (calc) (ref <5.0)
Triglycerides: 183 mg/dL — ABNORMAL HIGH (ref <150)

## 2020-03-30 LAB — COMPLETE METABOLIC PANEL WITH GFR
AG Ratio: 1.4 (calc) (ref 1.0–2.5)
ALT: 18 U/L (ref 6–29)
AST: 18 U/L (ref 10–35)
Albumin: 3.8 g/dL (ref 3.6–5.1)
Alkaline phosphatase (APISO): 30 U/L — ABNORMAL LOW (ref 37–153)
BUN: 17 mg/dL (ref 7–25)
CO2: 31 mmol/L (ref 20–32)
Calcium: 9.2 mg/dL (ref 8.6–10.4)
Chloride: 103 mmol/L (ref 98–110)
Creat: 0.74 mg/dL (ref 0.60–0.88)
GFR, Est African American: 85 mL/min/{1.73_m2} (ref >=60)
GFR, Est Non African American: 73 mL/min/{1.73_m2} (ref >=60)
Globulin: 2.7 g/dL (calc) (ref 1.9–3.7)
Glucose, Bld: 98 mg/dL (ref 65–99)
Potassium: 3.6 mmol/L (ref 3.5–5.3)
Sodium: 139 mmol/L (ref 135–146)
Total Bilirubin: 0.5 mg/dL (ref 0.2–1.2)
Total Protein: 6.5 g/dL (ref 6.1–8.1)

## 2020-04-02 ENCOUNTER — Encounter: Payer: Self-pay | Admitting: Nurse Practitioner

## 2020-04-02 ENCOUNTER — Other Ambulatory Visit: Payer: Self-pay

## 2020-04-02 ENCOUNTER — Ambulatory Visit (INDEPENDENT_AMBULATORY_CARE_PROVIDER_SITE_OTHER): Payer: Medicare Other | Admitting: Nurse Practitioner

## 2020-04-02 VITALS — BP 132/80 | HR 61 | Temp 97.5°F | Ht 63.0 in | Wt 187.0 lb

## 2020-04-02 DIAGNOSIS — K219 Gastro-esophageal reflux disease without esophagitis: Secondary | ICD-10-CM

## 2020-04-02 DIAGNOSIS — E785 Hyperlipidemia, unspecified: Secondary | ICD-10-CM

## 2020-04-02 DIAGNOSIS — F419 Anxiety disorder, unspecified: Secondary | ICD-10-CM | POA: Diagnosis not present

## 2020-04-02 DIAGNOSIS — M48062 Spinal stenosis, lumbar region with neurogenic claudication: Secondary | ICD-10-CM

## 2020-04-02 DIAGNOSIS — I480 Paroxysmal atrial fibrillation: Secondary | ICD-10-CM

## 2020-04-02 DIAGNOSIS — I10 Essential (primary) hypertension: Secondary | ICD-10-CM

## 2020-04-02 DIAGNOSIS — M810 Age-related osteoporosis without current pathological fracture: Secondary | ICD-10-CM

## 2020-04-02 DIAGNOSIS — E6609 Other obesity due to excess calories: Secondary | ICD-10-CM

## 2020-04-02 DIAGNOSIS — Z6833 Body mass index (BMI) 33.0-33.9, adult: Secondary | ICD-10-CM

## 2020-04-02 MED ORDER — ALPRAZOLAM 0.25 MG PO TABS: 0.1250 mg | ORAL_TABLET | Freq: Every day | ORAL | 1 refills | Status: DC | PRN

## 2020-04-02 NOTE — Patient Instructions (Addendum)
To use PLAIN Claritin or zyrtec 10 mg daily- this is OTC, can get generic   Follow up in 6 months with lab work prior to appt

## 2020-04-02 NOTE — Progress Notes (Signed)
Careteam: Patient Care Team: Lauree Chandler, NP as PCP - General (Geriatric Medicine) Martinique, Peter M, MD as PCP - Cardiology (Cardiology) Mcarthur Rossetti, MD as Attending Physician (Orthopedic Surgery) Larey Dresser, MD as Attending Physician (Cardiology) Thornell Sartorius, MD as Consulting Physician (Otolaryngology) Druscilla Brownie, MD as Consulting Physician (Dermatology) Calvert Cantor, MD as Consulting Physician (Ophthalmology) Debara Pickett Nadean Corwin, MD as Consulting Physician (Cardiology) Eustace Moore, MD as Consulting Physician (Neurosurgery)  PLACE OF SERVICE:  [facility]  Advanced Directive information    Allergies  Allergen Reactions  . Advil [Ibuprofen]     GI upset  . Aleve [Naproxen Sodium]     GI upset  . Aspirin Nausea And Vomiting    Takes baby aspirin qod without problems  . Atorvastatin     Leg cramps  . Celebrex [Celecoxib]     GI upset  . Diclofenac     GI upset  . Doxycycline     Headache  . Fosamax [Alendronate Sodium]     GI discomfort   . Metoclopramide Hcl     REACTION: heart palps  . Nsaids Other (See Comments)    Tears my stomach up   . Other     Antihistamine - increase BP  . Pravastatin     Leg cramps  . Timolol     Dropped HR    Chief Complaint  Patient presents with  . Medical Management of Chronic Issues    6 month follow-up   . Medication Refill    Refill Xanax  . Weight Gain    Patient with abdominal weight gain     HPI: Patient is a 86 y.o. female for routine follow up  A fib- following with cardiologist   Obesity- ongoing abdominal obesity, her GYN did a lot of exams to make sure there was nothing "growing" inside her.  Reports she is miserable due to the amount of fat.  Reports she has been to doctors and weight watchers but nothing has worked for her. Reports she had a meal plan but had her eating 5 times a day and she was always in the kitchen   Chronic pain and hip pain- limits exercise and  activity- not as active  Reports increase mucous and cough due to allergies. Not on any allergy medication.   Got a few moles removed and waiting to hear back from pathology  Anxiety- worse at night, feels like it is due to the fact she can not do some of the things she used to. Will use a xanax sometimes to help her calm down, uses once every 1-2 weeks. Also does not like night time in general.  Reports she is slower.  Entertaining and parties are harder at this time.   Review of Systems:  Review of Systems  Constitutional: Negative for chills, fever and weight loss.  HENT: Negative for tinnitus.   Respiratory: Negative for cough, sputum production and shortness of breath.   Cardiovascular: Negative for chest pain, palpitations and leg swelling.  Gastrointestinal: Negative for abdominal pain, constipation, diarrhea and heartburn.  Genitourinary: Negative for dysuria, frequency and urgency.  Musculoskeletal: Negative for back pain, falls, joint pain and myalgias.  Skin: Negative.   Neurological: Negative for dizziness and headaches.  Endo/Heme/Allergies: Positive for environmental allergies.  Psychiatric/Behavioral: Negative for depression and memory loss. The patient is nervous/anxious. The patient does not have insomnia.     Past Medical History:  Diagnosis Date  . Allergic rhinitis due  to pollen   . Anxiety state, unspecified    Sitautional- pain  . Arthritis    "right knee" (06/21/2016)  . Asymptomatic varicose veins   . Carpal tunnel syndrome   . Chest pain, unspecified   . Chronic lower back pain    "on the left side" (06/21/2016)  . Constipation   . Cramp of limb   . Depressive disorder, not elsewhere classified   . Diaphragmatic hernia without mention of obstruction or gangrene   . Diaphragmatic hernia without mention of obstruction or gangrene   . Disturbance of skin sensation   . Diverticulosis of colon (without mention of hemorrhage)   . Dysrhythmia    Atrial  Flutter  . Enthesopathy of hip region   . Esophageal reflux    , just occasional  . History of blood transfusion    "w/both knee replacements"  . History of duodenal ulcer   . History of kidney stones    passed  . Insomnia, unspecified   . Lumbago   . Migraine    "none since ~ 1990" (06/21/2016)  . Myalgia and myositis, unspecified   . Obesity, unspecified   . Other abnormal blood chemistry   . Other dysphagia   . Other malaise and fatigue   . Other nonspecific abnormal serum enzyme levels   . Other specified cardiac dysrhythmias(427.89)   . Pacemaker   . Pain in joint, ankle and foot   . Pain in joint, hand   . Pain in joint, lower leg   . Pain in joint, pelvic region and thigh   . Pain in limb   . Plantar fascial fibromatosis   . Presence of permanent cardiac pacemaker    -St Jude  . Reflux esophagitis   . Sciatica   . Spinal stenosis, unspecified region other than cervical   . Stricture and stenosis of esophagus   . Symptomatic menopausal or female climacteric states   . Unspecified essential hypertension   . Unspecified essential hypertension   . Unspecified vitamin D deficiency    Past Surgical History:  Procedure Laterality Date  . BREAST BIOPSY Left 1990s X 2  . CARDIOVERSION N/A 04/26/2016   Procedure: CARDIOVERSION;  Surgeon: Pixie Casino, MD;  Location: [facility];  Service: Cardiovascular;  Laterality: N/A;  . CATARACT EXTRACTION W/ INTRAOCULAR LENS IMPLANT Left 08/03/1999   DR EPES   . CATARACT EXTRACTION W/ INTRAOCULAR LENS IMPLANT Right 2000   DR EPES  . [place]  . COLONOSCOPY  1988   Normal   . DENTAL SURGERY Left 08/2016  . EP IMPLANTABLE DEVICE N/A 06/21/2016   Procedure: Pacemaker Implant;  Surgeon: Evans Lance, MD;  Location: [facility];  Service: Cardiovascular;  Laterality: N/A;  . ESOPHAGOGASTRODUODENOSCOPY (EGD) WITH ESOPHAGEAL DILATION  ~ 1982   Dr. Sharlett Iles  . EXCISION OF ACTINIC KERATOSIS       DR LUPTON   . EYE SURGERY    . INSERT / REPLACE / REMOVE PACEMAKER    . JOINT REPLACEMENT    . KNEE ARTHROSCOPY Left 2003  . KNEE ARTHROSCOPY Right 06-26-13  . KNEE CLOSED REDUCTION Right 10/23/2013   Procedure: CLOSED MANIPULATION RIGHT KNEE;  Surgeon: Mcarthur Rossetti, MD;  Location: [facility];  Service: Orthopedics;  Laterality: Right;  . LASER FOR CLOUDY CAP LEFT EYE Left 03/2006   DR DIGBY  . LUMBAR LAMINECTOMY/DECOMPRESSION MICRODISCECTOMY N/A 06/12/2019   Procedure: Laminectomy and Foraminotomy - Lumbar four-Lumbar five;  Surgeon: Eustace Moore, MD;  Location: [facility];  Service: Neurosurgery;  Laterality: N/A;  . [facility], Dr.Gray. Removed area on upper chest  . TOTAL KNEE ARTHROPLASTY Left 04/2004   DR RENDALL  . TOTAL KNEE ARTHROPLASTY Right 07/04/2013   Procedure: RIGHT TOTAL KNEE ARTHROPLASTY;  Surgeon: Mcarthur Rossetti, MD;  Location[facility];  Service: Orthopedics;  Laterality: Right;  . TRIGGER FINGER RELEASE Right 09/2018  . VAGINAL HYSTERECTOMY  1979   Social History:   reports that she has never smoked. She has never used smokeless tobacco. She reports that she does not drink alcohol or use drugs.  Family History  Problem Relation Age of Onset  . Ovarian cancer Mother   . Uterine cancer Mother   . Cerebrovascular Accident Mother   . Heart disease Father   . Hypertension Brother   . Obesity Daughter     Medications: Patient's Medications  New Prescriptions   No medications on file  Previous Medications   ACETAMINOPHEN (TYLENOL) 500 MG TABLET    Take 500-2,000 mg by mouth daily.    ALENDRONATE (FOSAMAX) 70 MG TABLET    TAKE 1 TABLET BY MOUTH EVERY 7 (SEVEN) DAYS. TAKE WITH A FULL GLASS OF WATER ON AN EMPTY STOMACH.   ALPRAZOLAM (XANAX) 0.25 MG TABLET    Take 0.5 tablets (0.125 mg total) by mouth daily as needed for anxiety.   ANTISEPTIC ORAL RINSE (BIOTENE) LIQD    15 mLs by Mouth Rinse route daily.    APIXABAN (ELIQUIS) 5 MG  TABS TABLET    Take 1 tablet (5 mg total) by mouth 2 (two) times daily.   ARTIFICIAL SALIVA (ACT DRY MOUTH) LOZG    Use as directed 1 drop in the mouth or throat as needed (dry mouth).   BIMATOPROST (LUMIGAN) 0.01 % SOLN    Place 1 drop into both eyes at bedtime.    CALCIUM CARB-CHOLECALCIFEROL (CALTRATE 600+D3) 600-800 MG-UNIT TABS    1 tablet twice daily   CHOLECALCIFEROL (VITAMIN D) 2000 UNITS TABLET    Take 2,000 Units by mouth daily.   DORZOLAMIDE (TRUSOPT) 2 % OPHTHALMIC SOLUTION    Place 1 drop into both eyes 3 (three) times daily.    ESOMEPRAZOLE (NEXIUM) 20 MG CAPSULE    Take 20 mg by mouth as needed.   EZETIMIBE (ZETIA) 10 MG TABLET    TAKE 1 TABLET ONCE DAILY.   FLUTICASONE (FLONASE) 50 MCG/ACT NASAL SPRAY    Place 1 spray into both nostrils as needed for allergies or rhinitis.   METHYLCELLULOSE (ARTIFICIAL TEARS) 1 % OPHTHALMIC SOLUTION    Place 1 drop into both eyes daily as needed (for dry eyes).    METOPROLOL TARTRATE (LOPRESSOR) 50 MG TABLET    Take 1 tablet (50 mg total) by mouth 2 (two) times daily.   NYSTATIN (MYCOSTATIN/NYSTOP) POWDER    Apply 1 application topically as needed.   NYSTATIN CREAM (MYCOSTATIN)    Apply 1 application topically as needed for dry skin.   PROPAFENONE (RYTHMOL SR) 225 MG 12 HR CAPSULE    TAKE 1 CAPSULE (225 MG TOTAL) BY MOUTH 2 (TWO) TIMES DAILY.   TIZANIDINE (ZANAFLEX) 2 MG CAPSULE    Take 1 capsule (2 mg total) by mouth at bedtime.   TRAZODONE (DESYREL) 50 MG TABLET    Take 1 tablet (50 mg total) by mouth at bedtime.   TROLAMINE SALICYLATE (ASPERCREME EX)    Apply 1 application topically at bedtime  as needed (leg pain.).   VALSARTAN-HYDROCHLOROTHIAZIDE (DIOVAN-HCT) 320-25 MG TABLET    TAKE 1 TABLET ONCE DAILY.  Modified Medications   No medications on file  Discontinued Medications   BETAMETHASONE DIPROPIONATE 0.05 % LOTION    Apply topically as needed.   CALCIUM CARBONATE ANTACID ([employer] ANTACID SOFT CHEWS ULTR ST) 1177 MG CHEW    Chew 1,177 mg by  mouth as needed (acid reflux/indigestion.).    ESOMEPRAZOLE (NEXIUM) 20 MG CAPSULE    TAKE 1 CAPSULE BY MOUTH EVERY DAY   METHYLPREDNISOLONE (MEDROL) 4 MG TABLET    Take as directed    Physical Exam:  Vitals:   04/02/20 1317  BP: 132/80  Pulse: 61  Temp: (!) 97.5 F (36.4 C)  TempSrc: Temporal  SpO2: 97%  Weight: 187 lb (84.8 kg)  Height: 5\' 3"  (1.6 m)   Body mass index is 33.13 kg/m. Wt Readings from Last 3 Encounters:  04/02/20 187 lb (84.8 kg)  03/18/20 188 lb 3.2 oz (85.4 kg)  10/29/19 188 lb (85.3 kg)    Physical Exam Constitutional:      General: She is not in acute distress.    Appearance: She is well-developed. She is not diaphoretic.  HENT:     Head: Normocephalic and atraumatic.     Mouth/Throat:     Pharynx: No oropharyngeal exudate.  Eyes:     Conjunctiva/sclera: Conjunctivae normal.     Pupils: Pupils are equal, round, and reactive to light.  Cardiovascular:     Rate and Rhythm: Normal rate and regular rhythm.     Heart sounds: Normal heart sounds.  Pulmonary:     Effort: Pulmonary effort is normal.     Breath sounds: Normal breath sounds.  Abdominal:     General: Bowel sounds are normal.     Palpations: Abdomen is soft.  Musculoskeletal:        General: No tenderness.     Cervical back: Normal range of motion and neck supple.  Skin:    General: Skin is warm and dry.  Neurological:     Mental Status: She is alert and oriented to person, place, and time.  Psychiatric:        Mood and Affect: Mood normal.        Behavior: Behavior normal.     Labs reviewed: Basic Metabolic Panel: Recent Labs    06/09/19 1109 09/29/19 0928 03/30/20 0931  NA 138 140 139  K 3.7 3.5 3.6  CL 101 104 103  CO2 28 31 31   GLUCOSE 107* 92 98  BUN 11 18 17   CREATININE 0.70 0.73 0.74  CALCIUM 9.0 9.1 9.2   Liver Function Tests: Recent Labs    09/29/19 0928 03/30/20 0931  AST 18 18  ALT 20 18  BILITOT 0.4 0.5  PROT 6.5 6.5   No results for input(s):  LIPASE, AMYLASE in the last 8760 hours. No results for input(s): AMMONIA in the last 8760 hours. CBC: Recent Labs    06/09/19 1109 09/29/19 0928 03/30/20 0931  WBC 7.5 5.1 4.5  NEUTROABS 4.5 2,351 2,102  HGB 13.3 12.4 12.8  HCT 41.1 38.1 39.1  MCV 95.4 92.5 92.7  PLT 186 169 179   Lipid Panel: Recent Labs    09/29/19 0928 03/30/20 0931  CHOL 142 153  HDL 39* 39*  LDLCALC 78 86  TRIG 158* 183*  CHOLHDL 3.6 3.9   TSH: No results for input(s): TSH in the last 8760 hours. A1C: Lab Results  Component  Value Date   HGBA1C 5.2 08/06/2017     Assessment/Plan 1.Paroxysmal atrial fibrillation (HCC) Stable, continues on metoprolol and propafenone for rate control and eliquis for anticoagulation.   2. Osteoporosis, unspecified osteoporosis type, unspecified pathological fracture presence -continues on fosamax with cal and vit d, recommended weight bearing exercise.  3.  Anxiety -reports an increase in anxiety more recently, she is working through this but having some anxiety with aging. Encouraged counseling If needed, uses xanax rarely. - ALPRAZolam (XANAX) 0.25 MG tablet; Take 0.5 tablets (0.125 mg total) by mouth daily as needed for anxiety.  Dispense: 30 tablet; Refill: 1  4. Hyperlipidemia, unspecified hyperlipidemia type -elevated triglycerides, discussed diet modifications. Continues on zetia at this time.  5. Spinal stenosis of lumbar region with neurogenic claudication Stable at this time. Continues to follow up with orthopedics.   6. Gastroesophageal reflux disease without esophagitis Controlled on nexium  7. Essential hypertension Stable on current regimen, again encouraged diet modifications.  8. Class 1 obesity due to excess calories with serious comorbidity and body mass index (BMI) of 33.0 to 33.9 in adult Discussed weight loss with increase in physical activity and diet modifications.  Next appt: 4 weeks for MOST form completion  Sigfredo Schreier K. Loyal,  [facility] [phone]

## 2020-04-05 ENCOUNTER — Other Ambulatory Visit: Payer: Self-pay | Admitting: Physician Assistant

## 2020-04-05 NOTE — Telephone Encounter (Signed)
Please advise 

## 2020-04-07 ENCOUNTER — Other Ambulatory Visit: Payer: Self-pay

## 2020-04-07 ENCOUNTER — Ambulatory Visit: Payer: Medicare Other | Admitting: Physician Assistant

## 2020-04-07 ENCOUNTER — Encounter: Payer: Self-pay | Admitting: Physician Assistant

## 2020-04-07 DIAGNOSIS — M7061 Trochanteric bursitis, right hip: Secondary | ICD-10-CM

## 2020-04-07 MED ORDER — METHYLPREDNISOLONE ACETATE 40 MG/ML IJ SUSP
40.0000 mg | INTRAMUSCULAR | Status: AC | PRN
  Administered 2020-04-07: 17:00:00 40 mg via INTRA_ARTICULAR

## 2020-04-07 MED ORDER — LIDOCAINE HCL 1 % IJ SOLN
3.0000 mL | INTRAMUSCULAR | Status: AC | PRN
  Administered 2020-04-07: 17:00:00 3 mL

## 2020-04-07 NOTE — Progress Notes (Signed)
Office Visit Note   Patient: [name]           Date of Birth: 08/18/1931           MRN: [id number] Visit Date: 04/07/2020              Requested by: Veronica Chandler, NP [name],  [place] [id number] PCP: Veronica Chandler, NP   Assessment & Plan: Visit Diagnoses:  1. Trochanteric bursitis, right hip     Plan: We will have her work on IT band stretching exercises.  If her pain persist or she develops numbness tingling down the leg recommend we refer her back to neurosurgery for her back and she has been seen there past.  Questions were encouraged and answered at length.  Follow-Up Instructions: Return if symptoms worsen or fail to improve.   Orders:  Orders Placed This Encounter  Procedures  . Large Joint Inj: R greater trochanter   No orders of the defined types were placed in this encounter.     Procedures: Large Joint Inj: R greater trochanter on 04/07/2020 4:39 PM Indications: pain Details: 22 G 1.5 in needle, lateral approach  Arthrogram: No  Medications: 3 mL lidocaine 1 %; 40 mg methylPREDNISolone acetate 40 MG/ML Outcome: tolerated well, no immediate complications Procedure, treatment alternatives, risks and benefits explained, specific risks discussed. Consent was given by the patient. Immediately prior to procedure a time out was called to verify the correct patient, procedure, equipment, support staff and site/side marked as required. Patient was prepped and draped in the usual sterile fashion.       Clinical Data: No additional findings.   Subjective: Chief Complaint  Patient presents with  . Right Hip - Pain    HPI [name] comes in today with right hip pain.  States it started a month ago she was cooking breakfast.  States she had severe pain lateral aspect of the right hip.  States the pain lasted about 25 to 30 seconds as far as severe pain but overall pain was completely gone in about 2 half minutes.  She has had 10  episodes over the last month.  She is having no real low back pain.  She describes the pain as a burning pain lateral aspect of the hip.  States she can place her finger on the tender spot of her hip where she feels the burning pain.  Regards to her left hip is much better since going to therapy.  Had her last Covid injection at the end of February. Review of Systems See HPI otherwise negative or noncontributory.  Objective: Vital Signs: There were no vitals taken for this visit.  Physical Exam Constitutional:      Appearance: She is not ill-appearing or diaphoretic.  Neurological:     Mental Status: She is alert and oriented to person, place, and time.  Psychiatric:        Behavior: Behavior normal.     Ortho Exam Bilateral hips excellent range of motion without pain.  Tenderness over the right trochanteric region.  There is no rashes skin lesions ulcerations over the right hip. Specialty Comments:  No specialty comments available.  Imaging: No results found.   PMFS History: Patient Active Problem List   Diagnosis Date Noted  . Sinus node dysfunction ([place]) 10/29/2019  . S/P lumbar laminectomy 06/12/2019  . Osteoporosis 01/28/2019  . Trochanteric bursitis, right hip 05/16/2017  . Cardiac pacemaker in situ 06/21/2016  . Atypical  atrial flutter ([place])   . Chest pain 04/10/2016  . Atrial fibrillation ([place]) 03/29/2016  . Hyperglycemia 02/17/2015  . Obese 02/17/2015  . Insomnia 01/27/2014  . 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 Knee joint replacement by other means 10/01/2013  . Arthritis of knee, right 07/04/2013  . Osteoarthritis of right knee 07/01/2013  . Cervicalgia 07/01/2013  . Bradycardia 05/13/2013  . HYPERCHOLESTEROLEMIA 06/02/2009  . Anxiety state 06/02/2009  . Essential hypertension 06/02/2009  . ESOPHAGEAL STRICTURE 06/02/2009  . GERD 06/02/2009  . HIATAL HERNIA 06/02/2009  . DIVERTICULOSIS, COLON 06/02/2009  . Dysphagia, pharyngoesophageal phase  06/02/2009  . Backache 05/01/2007  . LEG PAIN, RIGHT 05/01/2007   Past Medical History:  Diagnosis Date  . Allergic rhinitis due to pollen   . Anxiety state, unspecified    Sitautional- pain  . Arthritis    "right knee" (06/21/2016)  . Asymptomatic varicose veins   . Carpal tunnel syndrome   . Chest pain, unspecified   . Chronic lower back pain    "on the left side" (06/21/2016)  . Constipation   . Cramp of limb   . Depressive disorder, not elsewhere classified   . Diaphragmatic hernia without mention of obstruction or gangrene   . Diaphragmatic hernia without mention of obstruction or gangrene   . Disturbance of skin sensation   . Diverticulosis of colon (without mention of hemorrhage)   . Dysrhythmia    Atrial Flutter  . Enthesopathy of hip region   . Esophageal reflux    , just occasional  . History of blood transfusion    "w/both knee replacements"  . History of duodenal ulcer   . History of kidney stones    passed  . Insomnia, unspecified   . Lumbago   . Migraine    "none since ~ 1990" (06/21/2016)  . Myalgia and myositis, unspecified   . Obesity, unspecified   . Other abnormal blood chemistry   . Other dysphagia   . Other malaise and fatigue   . Other nonspecific abnormal serum enzyme levels   . Other specified cardiac dysrhythmias(427.89)   . Pacemaker   . Pain in joint, ankle and foot   . Pain in joint, hand   . Pain in joint, lower leg   . Pain in joint, pelvic region and thigh   . Pain in limb   . Plantar fascial fibromatosis   . Presence of permanent cardiac pacemaker    -St Jude  . Reflux esophagitis   . Sciatica   . Spinal stenosis, unspecified region other than cervical   . Stricture and stenosis of esophagus   . Symptomatic menopausal or female climacteric states   . Unspecified essential hypertension   . Unspecified essential hypertension   . Unspecified vitamin D deficiency     Family History  Problem Relation Age of Onset  . Ovarian  cancer Mother   . Uterine cancer Mother   . Cerebrovascular Accident Mother   . Heart disease Father   . Hypertension Brother   . Obesity Daughter     Past Surgical History:  Procedure Laterality Date  . BREAST BIOPSY Left 1990s X 2  . CARDIOVERSION N/A 04/26/2016   Procedure: CARDIOVERSION;  Surgeon: Pixie Casino, MD;  Location: [facility];  Service: Cardiovascular;  Laterality: N/A;  . CATARACT EXTRACTION W/ INTRAOCULAR LENS IMPLANT Left 08/03/1999   DR EPES   . CATARACT EXTRACTION W/ INTRAOCULAR LENS IMPLANT Right 2000   DR EPES  . CHOLECYSTECTOMY OPEN  [place]  . COLONOSCOPY  1988   Normal   . DENTAL SURGERY Left 08/2016  . EP IMPLANTABLE DEVICE N/A 06/21/2016   Procedure: Pacemaker Implant;  Surgeon: Evans Lance, MD;  Location: [facility];  Service: Cardiovascular;  Laterality: N/A;  . ESOPHAGOGASTRODUODENOSCOPY (EGD) WITH ESOPHAGEAL DILATION  ~ 1982   Dr. Sharlett Iles  . EXCISION OF ACTINIC KERATOSIS     DR LUPTON   . EYE SURGERY    . INSERT / REPLACE / REMOVE PACEMAKER    . JOINT REPLACEMENT    . KNEE ARTHROSCOPY Left 2003  . KNEE ARTHROSCOPY Right 06-26-13  . KNEE CLOSED REDUCTION Right 10/23/2013   Procedure: CLOSED MANIPULATION RIGHT KNEE;  Surgeon: Mcarthur Rossetti, MD;  Location: [facility];  Service: Orthopedics;  Laterality: Right;  . LASER FOR CLOUDY CAP LEFT EYE Left 03/2006   DR DIGBY  . LUMBAR LAMINECTOMY/DECOMPRESSION MICRODISCECTOMY N/A 06/12/2019   Procedure: Laminectomy and Foraminotomy - Lumbar four-Lumbar five;  Surgeon: Eustace Moore, MD;  Location: [facility];  Service: Neurosurgery;  Laterality: N/A;  . [facility], Dr.Gray. Removed area on upper chest  . TOTAL KNEE ARTHROPLASTY Left 04/2004   DR RENDALL  . TOTAL KNEE ARTHROPLASTY Right 07/04/2013   Procedure: RIGHT TOTAL KNEE ARTHROPLASTY;  Surgeon: Mcarthur Rossetti, MD;  [facility];  Service: Orthopedics;  Laterality: Right;  . TRIGGER FINGER  RELEASE Right 09/2018  . VAGINAL HYSTERECTOMY  1979   Social History   Occupational History  . Not on file  Tobacco Use  . Smoking status: Never Smoker  . Smokeless tobacco: Never Used  Substance and Sexual Activity  . Alcohol use: No  . Drug use: No  . Sexual activity: Yes

## 2020-04-14 ENCOUNTER — Other Ambulatory Visit: Payer: Self-pay | Admitting: Cardiology

## 2020-04-15 NOTE — Telephone Encounter (Signed)
Rx(s) sent to pharmacy electronically.  

## 2020-04-21 ENCOUNTER — Ambulatory Visit (INDEPENDENT_AMBULATORY_CARE_PROVIDER_SITE_OTHER): Payer: Medicare Other | Admitting: Nurse Practitioner

## 2020-04-21 ENCOUNTER — Other Ambulatory Visit: Payer: Self-pay

## 2020-04-21 ENCOUNTER — Encounter: Payer: Self-pay | Admitting: Nurse Practitioner

## 2020-04-21 VITALS — BP 120/80 | HR 63 | Temp 97.1°F | Resp 16 | Ht 63.0 in | Wt 183.2 lb

## 2020-04-21 DIAGNOSIS — Z7189 Other specified counseling: Secondary | ICD-10-CM | POA: Diagnosis not present

## 2020-04-21 NOTE — Progress Notes (Signed)
Careteam: Patient Care Team: Lauree Chandler, NP as PCP - General (Geriatric Medicine) Martinique, Peter M, MD as PCP - Cardiology (Cardiology) Mcarthur Rossetti, MD as Attending Physician (Orthopedic Surgery) Larey Dresser, MD as Attending Physician (Cardiology) Thornell Sartorius, MD as Consulting Physician (Otolaryngology) Druscilla Brownie, MD as Consulting Physician (Dermatology) Calvert Cantor, MD as Consulting Physician (Ophthalmology) Debara Pickett Nadean Corwin, MD as Consulting Physician (Cardiology) Eustace Moore, MD as Consulting Physician (Neurosurgery)  PLACE OF SERVICE:  [place] Directive information Does Patient Have a Medical Advance Directive?: Yes, Type of Advance Directive: [place], Does patient want to make changes to medical advance directive?: No - Patient declined  Allergies  Allergen Reactions  . Advil [Ibuprofen]     GI upset  . Aleve [Naproxen Sodium]     GI upset  . Aspirin Nausea And Vomiting    Takes baby aspirin qod without problems  . Atorvastatin     Leg cramps  . Celebrex [Celecoxib]     GI upset  . Diclofenac     GI upset  . Doxycycline     Headache  . Fosamax [Alendronate Sodium]     GI discomfort   . Metoclopramide Hcl     REACTION: heart palps  . Nsaids Other (See Comments)    Tears my stomach up   . Other     Antihistamine - increase BP  . Pravastatin     Leg cramps  . Timolol     Dropped HR    Chief Complaint  Patient presents with  . Form Completion    Most Form Completion.     HPI: Patient is a 86 y.o. female for most form completion.     Review of Systems:  Review of Systems  Constitutional: Negative for chills, fever and malaise/fatigue.     Past Medical History:  Diagnosis Date  . Allergic rhinitis due to pollen   . Anxiety state, unspecified    Sitautional- pain  . Arthritis    "right knee" (06/21/2016)  . Asymptomatic varicose veins   . Carpal tunnel syndrome   .  Chest pain, unspecified   . Chronic lower back pain    "on the left side" (06/21/2016)  . Constipation   . Cramp of limb   . Depressive disorder, not elsewhere classified   . Diaphragmatic hernia without mention of obstruction or gangrene   . Diaphragmatic hernia without mention of obstruction or gangrene   . Disturbance of skin sensation   . Diverticulosis of colon (without mention of hemorrhage)   . Dysrhythmia    Atrial Flutter  . Enthesopathy of hip region   . Esophageal reflux    , just occasional  . History of blood transfusion    "w/both knee replacements"  . History of duodenal ulcer   . History of kidney stones    passed  . Insomnia, unspecified   . Lumbago   . Migraine    "none since ~ 1990" (06/21/2016)  . Myalgia and myositis, unspecified   . Obesity, unspecified   . Other abnormal blood chemistry   . Other dysphagia   . Other malaise and fatigue   . Other nonspecific abnormal serum enzyme levels   . Other specified cardiac dysrhythmias(427.89)   . Pacemaker   . Pain in joint, ankle and foot   . Pain in joint, hand   . Pain in joint, lower leg   . Pain in joint, pelvic region  and thigh   . Pain in limb   . Plantar fascial fibromatosis   . Presence of permanent cardiac pacemaker    -St Jude  . Reflux esophagitis   . Sciatica   . Spinal stenosis, unspecified region other than cervical   . Stricture and stenosis of esophagus   . Symptomatic menopausal or female climacteric states   . Unspecified essential hypertension   . Unspecified essential hypertension   . Unspecified vitamin D deficiency    Past Surgical History:  Procedure Laterality Date  . BREAST BIOPSY Left 1990s X 2  . CARDIOVERSION N/A 04/26/2016   Procedure: CARDIOVERSION;  Surgeon: Pixie Casino, MD;  Location: [facility];  Service: Cardiovascular;  Laterality: N/A;  . CATARACT EXTRACTION W/ INTRAOCULAR LENS IMPLANT Left 08/03/1999   DR EPES   . CATARACT EXTRACTION W/ INTRAOCULAR LENS  IMPLANT Right 2000   DR EPES  . [place]  . COLONOSCOPY  1988   Normal   . DENTAL SURGERY Left 08/2016  . EP IMPLANTABLE DEVICE N/A 06/21/2016   Procedure: Pacemaker Implant;  Surgeon: Evans Lance, MD;  Location: [facility];  Service: Cardiovascular;  Laterality: N/A;  . ESOPHAGOGASTRODUODENOSCOPY (EGD) WITH ESOPHAGEAL DILATION  ~ 1982   Dr. Sharlett Iles  . EXCISION OF ACTINIC KERATOSIS     DR LUPTON   . EYE SURGERY    . INSERT / REPLACE / REMOVE PACEMAKER    . JOINT REPLACEMENT    . KNEE ARTHROSCOPY Left 2003  . KNEE ARTHROSCOPY Right 06-26-13  . KNEE CLOSED REDUCTION Right 10/23/2013   Procedure: CLOSED MANIPULATION RIGHT KNEE;  Surgeon: Mcarthur Rossetti, MD;  Location: [facility];  Service: Orthopedics;  Laterality: Right;  . LASER FOR CLOUDY CAP LEFT EYE Left 03/2006   DR DIGBY  . LUMBAR LAMINECTOMY/DECOMPRESSION MICRODISCECTOMY N/A 06/12/2019   Procedure: Laminectomy and Foraminotomy - Lumbar four-Lumbar five;  Surgeon: Eustace Moore, MD;  Location: [facility];  Service: Neurosurgery;  Laterality: N/A;  . [facility], Dr.Gray. Removed area on upper chest  . TOTAL KNEE ARTHROPLASTY Left 04/2004   DR RENDALL  . TOTAL KNEE ARTHROPLASTY Right 07/04/2013   Procedure: RIGHT TOTAL KNEE ARTHROPLASTY;  Surgeon: Mcarthur Rossetti, MD;  [facility];  Service: Orthopedics;  Laterality: Right;  . TRIGGER FINGER RELEASE Right 09/2018  . VAGINAL HYSTERECTOMY  1979   Social History:   reports that she has never smoked. She has never used smokeless tobacco. She reports that she does not drink alcohol or use drugs.  Family History  Problem Relation Age of Onset  . Ovarian cancer Mother   . Uterine cancer Mother   . Cerebrovascular Accident Mother   . Heart disease Father   . Hypertension Brother   . Obesity Daughter     Medications: Patient's Medications  New Prescriptions   No medications on file  Previous  Medications   ACETAMINOPHEN (TYLENOL) 500 MG TABLET    Take 500-2,000 mg by mouth daily.    ALENDRONATE (FOSAMAX) 70 MG TABLET    TAKE 1 TABLET BY MOUTH EVERY 7 (SEVEN) DAYS. TAKE WITH A FULL GLASS OF WATER ON AN EMPTY STOMACH.   ALPRAZOLAM (XANAX) 0.25 MG TABLET    Take 0.5 tablets (0.125 mg total) by mouth daily as needed for anxiety.   ANTISEPTIC ORAL RINSE (BIOTENE) LIQD    15 mLs by Mouth Rinse route daily.    APIXABAN (ELIQUIS)  5 MG TABS TABLET    Take 1 tablet (5 mg total) by mouth 2 (two) times daily.   ARTIFICIAL SALIVA (ACT DRY MOUTH) LOZG    Use as directed 1 drop in the mouth or throat as needed (dry mouth).   BIMATOPROST (LUMIGAN) 0.01 % SOLN    Place 1 drop into both eyes at bedtime.    CALCIUM CARB-CHOLECALCIFEROL (CALTRATE 600+D3) 600-800 MG-UNIT TABS    1 tablet twice daily   CHOLECALCIFEROL (VITAMIN D) 2000 UNITS TABLET    Take 2,000 Units by mouth daily.   DERMA-SMOOTHE/FS SCALP 0.01 % OIL    Apply topically daily. 3 Days.   DORZOLAMIDE (TRUSOPT) 2 % OPHTHALMIC SOLUTION    Place 1 drop into both eyes 3 (three) times daily.    ESOMEPRAZOLE (NEXIUM) 20 MG CAPSULE    Take 20 mg by mouth as needed.   EZETIMIBE (ZETIA) 10 MG TABLET    TAKE 1 TABLET ONCE DAILY.   FLUTICASONE (FLONASE) 50 MCG/ACT NASAL SPRAY    Place 1 spray into both nostrils as needed for allergies or rhinitis.   METHYLCELLULOSE (ARTIFICIAL TEARS) 1 % OPHTHALMIC SOLUTION    Place 1 drop into both eyes daily as needed (for dry eyes).    METOPROLOL TARTRATE (LOPRESSOR) 50 MG TABLET    TAKE 1 TABLET BY MOUTH TWICE A DAY   NYSTATIN (MYCOSTATIN/NYSTOP) POWDER    Apply 1 application topically as needed.   NYSTATIN CREAM (MYCOSTATIN)    Apply 1 application topically as needed for dry skin.   PROPAFENONE (RYTHMOL SR) 225 MG 12 HR CAPSULE    TAKE 1 CAPSULE (225 MG TOTAL) BY MOUTH 2 (TWO) TIMES DAILY.   TIZANIDINE (ZANAFLEX) 2 MG CAPSULE    TAKE 1 CAPSULE (2 MG TOTAL) BY MOUTH AT BEDTIME.   TRAZODONE (DESYREL) 50 MG TABLET     Take 1 tablet (50 mg total) by mouth at bedtime.   TROLAMINE SALICYLATE (ASPERCREME EX)    Apply 1 application topically at bedtime as needed (leg pain.).   VALSARTAN-HYDROCHLOROTHIAZIDE (DIOVAN-HCT) 320-25 MG TABLET    TAKE 1 TABLET ONCE DAILY.  Modified Medications   No medications on file  Discontinued Medications   No medications on file    Physical Exam:  Vitals:   04/21/20 1422  BP: 120/80  Pulse: 63  Resp: 16  Temp: (!) 97.1 F (36.2 C)  SpO2: 97%  Weight: 183 lb 3.2 oz (83.1 kg)  Height: 5\' 3"  (1.6 m)   Body mass index is 32.45 kg/m. Wt Readings from Last 3 Encounters:  04/21/20 183 lb 3.2 oz (83.1 kg)  04/02/20 187 lb (84.8 kg)  03/18/20 188 lb 3.2 oz (85.4 kg)    Physical Exam Constitutional:      Appearance: Normal appearance.  Skin:    General: Skin is warm and dry.  Neurological:     Mental Status: She is alert and oriented to person, place, and time. Mental status is at baseline.  Psychiatric:        Mood and Affect: Mood normal.        Behavior: Behavior normal.     Labs reviewed: Basic Metabolic Panel: Recent Labs    06/09/19 1109 09/29/19 0928 03/30/20 0931  NA 138 140 139  K 3.7 3.5 3.6  CL 101 104 103  CO2 28 31 31   GLUCOSE 107* 92 98  BUN 11 18 17   CREATININE 0.70 0.73 0.74  CALCIUM 9.0 9.1 9.2   Liver Function Tests: Recent Labs  09/29/19 0928 03/30/20 0931  AST 18 18  ALT 20 18  BILITOT 0.4 0.5  PROT 6.5 6.5   No results for input(s): LIPASE, AMYLASE in the last 8760 hours. No results for input(s): AMMONIA in the last 8760 hours. CBC: Recent Labs    06/09/19 1109 09/29/19 0928 03/30/20 0931  WBC 7.5 5.1 4.5  NEUTROABS 4.5 2,351 2,102  HGB 13.3 12.4 12.8  HCT 41.1 38.1 39.1  MCV 95.4 92.5 92.7  PLT 186 169 179   Lipid Panel: Recent Labs    09/29/19 0928 03/30/20 0931  CHOL 142 153  HDL 39* 39*  LDLCALC 78 86  TRIG 158* 183*  CHOLHDL 3.6 3.9   TSH: No results for input(s): TSH in the last 8760  hours. A1C: Lab Results  Component Value Date   HGBA1C 5.2 08/06/2017     Assessment/Plan 1. Advance care planning -MOST form completed and printed and all answered.  -DNR  Next appt: 09/27/2020 as scheduled. [name]. Harle Battiest  [facility] [phone]   Total time 15 mins:  time greater than 50% of total time spent doing pt counseling and discussion on advanced care planning

## 2020-05-06 ENCOUNTER — Other Ambulatory Visit: Payer: Self-pay | Admitting: Cardiology

## 2020-05-06 DIAGNOSIS — I4819 Other persistent atrial fibrillation: Secondary | ICD-10-CM

## 2020-06-22 ENCOUNTER — Ambulatory Visit (INDEPENDENT_AMBULATORY_CARE_PROVIDER_SITE_OTHER): Payer: Medicare Other | Admitting: *Deleted

## 2020-06-22 DIAGNOSIS — I495 Sick sinus syndrome: Secondary | ICD-10-CM

## 2020-06-22 LAB — CUP PACEART REMOTE DEVICE CHECK
Battery Remaining Longevity: 124 mo
Battery Remaining Percentage: 95.5 %
Battery Voltage: 2.99 V
Brady Statistic AP VP Percent: 6.9 %
Brady Statistic AP VS Percent: 92 %
Brady Statistic AS VP Percent: 1 %
Brady Statistic AS VS Percent: 1 %
Brady Statistic RA Percent Paced: 99 %
Brady Statistic RV Percent Paced: 6.9 %
Date Time Interrogation Session: 20210629055052
Implantable Lead Implant Date: 20170628
Implantable Lead Implant Date: 20170628
Implantable Lead Location: 753859
Implantable Lead Location: 753860
Implantable Pulse Generator Implant Date: 20170628
Lead Channel Impedance Value: 530 Ohm
Lead Channel Impedance Value: 600 Ohm
Lead Channel Pacing Threshold Amplitude: 0.75 V
Lead Channel Pacing Threshold Amplitude: 1.25 V
Lead Channel Pacing Threshold Pulse Width: 0.4 ms
Lead Channel Pacing Threshold Pulse Width: 0.4 ms
Lead Channel Sensing Intrinsic Amplitude: 1.2 mV
Lead Channel Sensing Intrinsic Amplitude: 9.2 mV
Lead Channel Setting Pacing Amplitude: 2 V
Lead Channel Setting Pacing Amplitude: 2.5 V
Lead Channel Setting Pacing Pulse Width: 0.4 ms
Lead Channel Setting Sensing Sensitivity: 2 mV
Pulse Gen Model: 2272
Pulse Gen Serial Number: 7910313

## 2020-06-23 ENCOUNTER — Telehealth: Payer: Self-pay

## 2020-06-23 NOTE — Telephone Encounter (Signed)
Spoke with patient to explain PPM transmission results. Pt appreciative of explanation and denies additional questions at this time.

## 2020-06-23 NOTE — Telephone Encounter (Signed)
Patient called in wanting to know what Sinus node dysfunction means, which she saw on her my chart for her recent transmission

## 2020-06-23 NOTE — Progress Notes (Signed)
Remote pacemaker transmission.   

## 2020-06-29 ENCOUNTER — Telehealth: Payer: Self-pay | Admitting: Cardiology

## 2020-06-29 NOTE — Telephone Encounter (Signed)
LVM on 06/29/20  for patient to return call to get follow up visit scheduled from patient recall lists

## 2020-08-01 ENCOUNTER — Other Ambulatory Visit: Payer: Self-pay | Admitting: Nurse Practitioner

## 2020-08-02 NOTE — Telephone Encounter (Signed)
Prescription refill has high allergy warning

## 2020-08-14 ENCOUNTER — Other Ambulatory Visit: Payer: Self-pay | Admitting: Cardiology

## 2020-09-11 NOTE — Progress Notes (Signed)
Cardiology Office Note   Date:  09/16/2020   ID:  [name], [name] 12/18/1931, MRN [id number]  PCP:  Lauree Chandler, NP  Cardiologist:   Stetson Pelaez Martinique, MD   Chief Complaint  Patient presents with  . Shortness of Breath      History of Present Illness: [name] is a 86 y.o. female is seen for follow up Afib. She has a long history of marked sinus bradycardia and atrial fibrillation/flutter. She was seen initially in 2014. Echo at that time showed mild LAE otherwise normal. Holter showed mean HR 59 with lowest HR 43 and peak HR 109.    On March 9,2017 she noted an increased HR by BP monitor up to 153. At this time she felt marked indigestion from her waist to her neck. She felt her heart fluttering.   She was seen by Dr. Nyoka Cowden on 03/29/16 and found to be in Afib with RVR. She was started on Eliquis and metoprolol. Myoview study and Echo were normal.   She later had attempt at DCCV. She was in an atypical atrial flutter at that time. DCCV resulted in very prolonged pauses > 6 seconds and return to flutter. She was seen by Dr. Curt Bears and placed on flecainide. This did convert her to NSR but made her feel very sick with nausea, dizziness, and extreme fatigue. Flecainide was discontinued.. She continued to have marked bradycardia and underwent PPM placement on 06/21/16. When seen in September 2018 by Dr. Lovena Le she had an Afib burden of 94%. She was started on propafenone for her Afib. Initially this medication caused her to have more nausea but this has improved.  On her pacemaker checks her Afib burden is less than 1% since December 2019. Last pacemaker check in June was normal.  In June 2020 she underwent L4-5 lumbar laminectomy by Dr Ronnald Ramp. This went well. She just notices a little numbness in her leg now.  On follow up today she notes that for the last 3 months she has experienced increased symptoms of SOB and fatigue. She likes to stay active but this has really become more  difficult. At night she will get a sudden sensation under her left ribs and has to catch her breath. No edema. No cough or chest pain.    Past Medical History:  Diagnosis Date  . Allergic rhinitis due to pollen   . Anxiety state, unspecified    Sitautional- pain  . Arthritis    "right knee" (06/21/2016)  . Asymptomatic varicose veins   . Carpal tunnel syndrome   . Chest pain, unspecified   . Chronic lower back pain    "on the left side" (06/21/2016)  . Constipation   . Cramp of limb   . Depressive disorder, not elsewhere classified   . Diaphragmatic hernia without mention of obstruction or gangrene   . Diaphragmatic hernia without mention of obstruction or gangrene   . Disturbance of skin sensation   . Diverticulosis of colon (without mention of hemorrhage)   . Dysrhythmia    Atrial Flutter  . Enthesopathy of hip region   . Esophageal reflux    , just occasional  . History of blood transfusion    "w/both knee replacements"  . History of duodenal ulcer   . History of kidney stones    passed  . Insomnia, unspecified   . Lumbago   . Migraine    "none since ~ 1990" (06/21/2016)  . Myalgia and myositis, unspecified   .  Obesity, unspecified   . Other abnormal blood chemistry   . Other dysphagia   . Other malaise and fatigue   . Other nonspecific abnormal serum enzyme levels   . Other specified cardiac dysrhythmias(427.89)   . Pacemaker   . Pain in joint, ankle and foot   . Pain in joint, hand   . Pain in joint, lower leg   . Pain in joint, pelvic region and thigh   . Pain in limb   . Plantar fascial fibromatosis   . Presence of permanent cardiac pacemaker    -St Jude  . Reflux esophagitis   . Sciatica   . Spinal stenosis, unspecified region other than cervical   . Stricture and stenosis of esophagus   . Symptomatic menopausal or female climacteric states   . Unspecified essential hypertension   . Unspecified essential hypertension   . Unspecified vitamin D deficiency      Past Surgical History:  Procedure Laterality Date  . BREAST BIOPSY Left 1990s X 2  . CARDIOVERSION N/A 04/26/2016   Procedure: CARDIOVERSION;  Surgeon: Pixie Casino, MD;  Location: [facility];  Service: Cardiovascular;  Laterality: N/A;  . CATARACT EXTRACTION W/ INTRAOCULAR LENS IMPLANT Left 08/03/1999   DR EPES   . CATARACT EXTRACTION W/ INTRAOCULAR LENS IMPLANT Right 2000   DR EPES  . [place]  . COLONOSCOPY  1988   Normal   . DENTAL SURGERY Left 08/2016  . EP IMPLANTABLE DEVICE N/A 06/21/2016   Procedure: Pacemaker Implant;  Surgeon: Evans Lance, MD;  Location: [facility];  Service: Cardiovascular;  Laterality: N/A;  . ESOPHAGOGASTRODUODENOSCOPY (EGD) WITH ESOPHAGEAL DILATION  ~ 1982   Dr. Sharlett Iles  . EXCISION OF ACTINIC KERATOSIS     DR LUPTON   . EYE SURGERY    . INSERT / REPLACE / REMOVE PACEMAKER    . JOINT REPLACEMENT    . KNEE ARTHROSCOPY Left 2003  . KNEE ARTHROSCOPY Right 06-26-13  . KNEE CLOSED REDUCTION Right 10/23/2013   Procedure: CLOSED MANIPULATION RIGHT KNEE;  Surgeon: Mcarthur Rossetti, MD;  Location: [facility];  Service: Orthopedics;  Laterality: Right;  . LASER FOR CLOUDY CAP LEFT EYE Left 03/2006   DR DIGBY  . LUMBAR LAMINECTOMY/DECOMPRESSION MICRODISCECTOMY N/A 06/12/2019   Procedure: Laminectomy and Foraminotomy - Lumbar four-Lumbar five;  Surgeon: Eustace Moore, MD;  Location: [facility];  Service: Neurosurgery;  Laterality: N/A;  . [facility], Dr.Gray. Removed area on upper chest  . TOTAL KNEE ARTHROPLASTY Left 04/2004   DR RENDALL  . TOTAL KNEE ARTHROPLASTY Right 07/04/2013   Procedure: RIGHT TOTAL KNEE ARTHROPLASTY;  Surgeon: Mcarthur Rossetti, MD;  [facility];  Service: Orthopedics;  Laterality: Right;  . TRIGGER FINGER RELEASE Right 09/2018  . VAGINAL HYSTERECTOMY  1979     Current Outpatient Medications  Medication Sig Dispense Refill  . acetaminophen (TYLENOL)  500 MG tablet Take 500-2,000 mg by mouth daily.     Marland Kitchen alendronate (FOSAMAX) 70 MG tablet TAKE 1 TABLET BY MOUTH EVERY 7 (SEVEN) DAYS. TAKE WITH A FULL GLASS OF WATER ON AN EMPTY STOMACH. 12 tablet 3  . ALPRAZolam (XANAX) 0.25 MG tablet Take 0.5 tablets (0.125 mg total) by mouth daily as needed for anxiety. 30 tablet 1  . antiseptic oral rinse (BIOTENE) LIQD 15 mLs by Mouth Rinse route daily.     . Artificial Saliva (ACT DRY MOUTH) LOZG Use as  directed 1 drop in the mouth or throat as needed (dry mouth).    . bimatoprost (LUMIGAN) 0.01 % SOLN Place 1 drop into both eyes at bedtime.     . Calcium Carb-Cholecalciferol (CALTRATE 600+D3) 600-800 MG-UNIT TABS 1 tablet twice daily 60 tablet 0  . Cholecalciferol (VITAMIN D) 2000 UNITS tablet Take 2,000 Units by mouth daily.    . dorzolamide (TRUSOPT) 2 % ophthalmic solution Place 1 drop into both eyes 3 (three) times daily.     Marland Kitchen ELIQUIS 5 MG TABS tablet TAKE 1 TABLET BY MOUTH TWICE A DAY 180 tablet 3  . esomeprazole (NEXIUM) 20 MG capsule Take 20 mg by mouth as needed.    . ezetimibe (ZETIA) 10 MG tablet TAKE 1 TABLET ONCE DAILY. 90 tablet 0  . fluticasone (FLONASE) 50 MCG/ACT nasal spray Place 1 spray into both nostrils as needed for allergies or rhinitis.    . methylcellulose (ARTIFICIAL TEARS) 1 % ophthalmic solution Place 1 drop into both eyes daily as needed (for dry eyes).     . metoprolol tartrate (LOPRESSOR) 50 MG tablet TAKE 1 TABLET BY MOUTH TWICE A DAY 180 tablet 3  . propafenone (RYTHMOL SR) 225 MG 12 hr capsule TAKE 1 CAPSULE BY MOUTH 2 TIMES DAILY. 60 capsule 5  . tizanidine (ZANAFLEX) 2 MG capsule TAKE 1 CAPSULE (2 MG TOTAL) BY MOUTH AT BEDTIME. 15 capsule 0  . traZODone (DESYREL) 50 MG tablet Take 1 tablet (50 mg total) by mouth at bedtime. 30 tablet 3  . valsartan-hydrochlorothiazide (DIOVAN-HCT) 320-25 MG tablet TAKE 1 TABLET ONCE DAILY. 90 tablet 0   No current facility-administered medications for this visit.    Allergies:   Advil  [ibuprofen], Aleve [naproxen sodium], Aspirin, Atorvastatin, Celebrex [celecoxib], Diclofenac, Doxycycline, Fosamax [alendronate sodium], Metoclopramide hcl, Nsaids, Other, Pravastatin, and Timolol    Social History:  The patient  reports that she has never smoked. She has never used smokeless tobacco. She reports that she does not drink alcohol and does not use drugs.   Family History:  The patient's family history includes Cerebrovascular Accident in her mother; Heart disease in her father; Hypertension in her brother; Obesity in her daughter; Ovarian cancer in her mother; Uterine cancer in her mother.    ROS:  Please see the history of present illness.   Otherwise, review of systems are positive for none.   All other systems are reviewed and negative.    PHYSICAL EXAM: VS:  BP 125/73   Pulse 62   Temp (!) 94.5 F (34.7 C)   Ht 5\' 4"  (1.626 m)   Wt 186 lb 9.6 oz (84.6 kg)   SpO2 96%   BMI 32.03 kg/m  , BMI Body mass index is 32.03 kg/m. GENERAL:  Well appearing, obese WF in NAD HEENT:  PERRL, EOMI, sclera are clear. Oropharynx is clear. NECK:  No jugular venous distention, carotid upstroke brisk and symmetric, no bruits, no thyromegaly or adenopathy LUNGS:  Right basilar crackles.  CHEST:  Unremarkable HEART:  RRR,  PMI not displaced or sustained,S1 and S2 within normal limits, no S3, no S4: no clicks, no rubs, no murmurs ABD:  Soft, nontender. BS +, no masses or bruits. No hepatomegaly, no splenomegaly EXT:  2 + pulses throughout, no edema, no cyanosis no clubbing SKIN:  Warm and dry.  No rashes NEURO:  Alert and oriented x 3. Cranial nerves II through XII intact. PSYCH:  Cognitively intact    EKG:  EKG is ordered today. Atrial paced  rhythm. Rate 62. Nonspecific ST-T change. I have personally reviewed and interpreted this study.   Recent Labs: 03/30/2020: ALT 18; BUN 17; Creat 0.74; Hemoglobin 12.8; Platelets 179; Potassium 3.6; Sodium 139    Lipid Panel    Component  Value Date/Time   CHOL 153 03/30/2020 0931   CHOL 179 03/27/2016 0853   TRIG 183 (H) 03/30/2020 0931   HDL 39 (L) 03/30/2020 0931   HDL 42 03/27/2016 0853   CHOLHDL 3.9 03/30/2020 0931   VLDL 42 (H) 08/06/2017 0918   LDLCALC 86 03/30/2020 0931      Wt Readings from Last 3 Encounters:  09/16/20 186 lb 9.6 oz (84.6 kg)  04/21/20 183 lb 3.2 oz (83.1 kg)  04/02/20 187 lb (84.8 kg)      Other studies Reviewed: Additional studies/ records that were reviewed today include:  Echo: 09/06/17: Study Conclusions  - Left ventricle: The cavity size was normal. Wall thickness was   increased in a pattern of mild LVH. Systolic function was normal.   The estimated ejection fraction was in the range of 55% to 60%.   Doppler parameters are consistent with both elevated ventricular   end-diastolic filling pressure and elevated left atrial filling   pressure. - Left atrium: The atrium was moderately dilated. - Atrial septum: There was increased thickness of the septum,   consistent with lipomatous hypertrophy. No defect or patent   foramen ovale was identified  ASSESSMENT AND PLAN:  1.   Atrial fibrillation/flutter with RVR.  Intolerant of flecainide.  Now s/p PPM placement for marked post conversion pauses. On propafenone with marked improvement in Afib burden. Followed by Dr Lovena Le. Will be seeing him in November. Will continue same. Continue Eliquis for anticoagulation.   2. HTN well controlled.  3. Mild hypercholesterolemia.   4. Shortness of breath. Etiology unclear. No gross volume overload. Some basilar crackles on the right. PE unlikely since on Eliquis.  Will check CBC, BMET, BNP, and TSH. Check CXR and Echo. Follow up after above studies.    Signed, Chalsea Darko Martinique, [place]  09/16/2020 4:52 PM    [facility] [address] [place], [place], [address] Phone [phone], Fax [phone]

## 2020-09-16 ENCOUNTER — Ambulatory Visit: Payer: Medicare Other | Admitting: Cardiology

## 2020-09-16 ENCOUNTER — Other Ambulatory Visit: Payer: Self-pay

## 2020-09-16 ENCOUNTER — Encounter: Payer: Self-pay | Admitting: Cardiology

## 2020-09-16 VITALS — BP 125/73 | HR 62 | Temp 94.5°F | Ht 64.0 in | Wt 186.6 lb

## 2020-09-16 DIAGNOSIS — I480 Paroxysmal atrial fibrillation: Secondary | ICD-10-CM

## 2020-09-16 DIAGNOSIS — Z950 Presence of cardiac pacemaker: Secondary | ICD-10-CM

## 2020-09-16 DIAGNOSIS — R0602 Shortness of breath: Secondary | ICD-10-CM | POA: Diagnosis not present

## 2020-09-16 DIAGNOSIS — I495 Sick sinus syndrome: Secondary | ICD-10-CM

## 2020-09-16 DIAGNOSIS — I10 Essential (primary) hypertension: Secondary | ICD-10-CM

## 2020-09-16 NOTE — Patient Instructions (Signed)
Medication Instructions:  Continue same medications *If you need a refill on your cardiac medications before your next appointment, please call your pharmacy*   Lab Work: Bmet,bnp,cbc,tsh, Friday 9/24     Testing/Procedures:  Echo   Chest xray Fri 9/24 at [facility] 8:00 am to 5:00 pm    Follow-Up: At [facility], you and your health needs are our priority.  As part of our continuing mission to provide you with exceptional heart care, we have created designated Provider Care Teams.  These Care Teams include your primary Cardiologist (physician) and Advanced Practice Providers (APPs -  Physician Assistants and Nurse Practitioners) who all work together to provide you with the care you need, when you need it.  We recommend signing up for the patient portal called "MyChart".  Sign up information is provided on this After Visit Summary.  MyChart is used to connect with patients for Virtual Visits (Telemedicine).  Patients are able to view lab/test results, encounter notes, upcoming appointments, etc.  Non-urgent messages can be sent to your provider as well.   To learn more about what you can do with MyChart, go to [email or web address].    Your next appointment:  Wed 10/20 at 2:00 pm   The format for your next appointment: Office   Provider:  Dr.Jordan

## 2020-09-17 ENCOUNTER — Other Ambulatory Visit: Payer: Self-pay | Admitting: Cardiology

## 2020-09-17 ENCOUNTER — Ambulatory Visit
Admission: RE | Admit: 2020-09-17 | Discharge: 2020-09-17 | Disposition: A | Discharge status: Home / Self Care | Payer: Medicare Other | Source: Ambulatory Visit | Attending: Cardiology | Admitting: Cardiology

## 2020-09-17 DIAGNOSIS — I480 Paroxysmal atrial fibrillation: Secondary | ICD-10-CM

## 2020-09-17 DIAGNOSIS — I495 Sick sinus syndrome: Secondary | ICD-10-CM

## 2020-09-17 DIAGNOSIS — I10 Essential (primary) hypertension: Secondary | ICD-10-CM

## 2020-09-17 DIAGNOSIS — R0602 Shortness of breath: Secondary | ICD-10-CM

## 2020-09-17 DIAGNOSIS — Z950 Presence of cardiac pacemaker: Secondary | ICD-10-CM

## 2020-09-17 NOTE — Addendum Note (Signed)
Addended by: Kathyrn Lass on: 09/17/2020 08:31 AM   Modules accepted: Orders

## 2020-09-18 LAB — CBC WITH DIFFERENTIAL/PLATELET
Basophils Absolute: 0.1 x10E3/uL (ref 0.0–0.2)
Basos: 1 %
EOS (ABSOLUTE): 0.2 x10E3/uL (ref 0.0–0.4)
Eos: 4 %
Hematocrit: 38.5 % (ref 34.0–46.6)
Hemoglobin: 13.1 g/dL (ref 11.1–15.9)
Immature Grans (Abs): 0 x10E3/uL (ref 0.0–0.1)
Immature Granulocytes: 0 %
Lymphocytes Absolute: 2 x10E3/uL (ref 0.7–3.1)
Lymphs: 40 %
MCH: 31.6 pg (ref 26.6–33.0)
MCHC: 34 g/dL (ref 31.5–35.7)
MCV: 93 fL (ref 79–97)
Monocytes Absolute: 0.5 x10E3/uL (ref 0.1–0.9)
Monocytes: 10 %
Neutrophils Absolute: 2.2 x10E3/uL (ref 1.4–7.0)
Neutrophils: 45 %
Platelets: 180 x10E3/uL (ref 150–450)
RBC: 4.14 x10E6/uL (ref 3.77–5.28)
RDW: 12.4 % (ref 11.7–15.4)
WBC: 5 x10E3/uL (ref 3.4–10.8)

## 2020-09-18 LAB — BASIC METABOLIC PANEL WITH GFR
BUN/Creatinine Ratio: 22 (ref 12–28)
BUN: 18 mg/dL (ref 8–27)
CO2: 26 mmol/L (ref 20–29)
Calcium: 9.5 mg/dL (ref 8.7–10.3)
Chloride: 101 mmol/L (ref 96–106)
Creatinine, Ser: 0.81 mg/dL (ref 0.57–1.00)
GFR calc Af Amer: 76 mL/min/1.73
GFR calc non Af Amer: 66 mL/min/1.73
Glucose: 92 mg/dL (ref 65–99)
Potassium: 4 mmol/L (ref 3.5–5.2)
Sodium: 140 mmol/L (ref 134–144)

## 2020-09-18 LAB — TSH: TSH: 6.2 u[IU]/mL — ABNORMAL HIGH (ref 0.450–4.500)

## 2020-09-18 LAB — BRAIN NATRIURETIC PEPTIDE: BNP: 59 pg/mL (ref 0.0–100.0)

## 2020-09-20 ENCOUNTER — Telehealth: Payer: Self-pay

## 2020-09-20 DIAGNOSIS — R7989 Other specified abnormal findings of blood chemistry: Secondary | ICD-10-CM

## 2020-09-20 NOTE — Telephone Encounter (Signed)
Spoke to patient lab results given.Advised to repeat tsh and free t4.She stated she continues to have a dry cough,hoarseness,sob.Stated she stays tired no energy.She has a heaviness and pressure in chest.Stated she told Dr.Jordan her symptoms at office visit last week.She has a echo scheduled 10/7. Advised I will make Dr.Jordan aware.

## 2020-09-20 NOTE — Telephone Encounter (Signed)
TSH and Free T4 to be done 09/21/20.Orders placed.

## 2020-09-21 ENCOUNTER — Ambulatory Visit (INDEPENDENT_AMBULATORY_CARE_PROVIDER_SITE_OTHER): Payer: Medicare Other | Admitting: Emergency Medicine

## 2020-09-21 DIAGNOSIS — R001 Bradycardia, unspecified: Secondary | ICD-10-CM | POA: Diagnosis not present

## 2020-09-21 LAB — CUP PACEART REMOTE DEVICE CHECK
Battery Remaining Longevity: 122 mo
Battery Remaining Percentage: 95.5 %
Battery Voltage: 2.99 V
Brady Statistic AP VP Percent: 8 %
Brady Statistic AP VS Percent: 91 %
Brady Statistic AS VP Percent: 1 %
Brady Statistic AS VS Percent: 1 %
Brady Statistic RA Percent Paced: 99 %
Brady Statistic RV Percent Paced: 8.1 %
Date Time Interrogation Session: 20210928020015
Implantable Lead Implant Date: 20170628
Implantable Lead Implant Date: 20170628
Implantable Lead Location: 753859
Implantable Lead Location: 753860
Implantable Pulse Generator Implant Date: 20170628
Lead Channel Impedance Value: 530 Ohm
Lead Channel Impedance Value: 590 Ohm
Lead Channel Pacing Threshold Amplitude: 0.75 V
Lead Channel Pacing Threshold Amplitude: 1.25 V
Lead Channel Pacing Threshold Pulse Width: 0.4 ms
Lead Channel Pacing Threshold Pulse Width: 0.4 ms
Lead Channel Sensing Intrinsic Amplitude: 0.9 mV
Lead Channel Sensing Intrinsic Amplitude: 9.3 mV
Lead Channel Setting Pacing Amplitude: 2 V
Lead Channel Setting Pacing Amplitude: 2.5 V
Lead Channel Setting Pacing Pulse Width: 0.4 ms
Lead Channel Setting Sensing Sensitivity: 2 mV
Pulse Gen Model: 2272
Pulse Gen Serial Number: 7910313

## 2020-09-23 NOTE — Progress Notes (Signed)
Remote pacemaker transmission.   

## 2020-09-26 ENCOUNTER — Other Ambulatory Visit: Payer: Self-pay | Admitting: Nurse Practitioner

## 2020-09-26 DIAGNOSIS — G4700 Insomnia, unspecified: Secondary | ICD-10-CM

## 2020-09-26 DIAGNOSIS — J069 Acute upper respiratory infection, unspecified: Secondary | ICD-10-CM

## 2020-09-27 ENCOUNTER — Other Ambulatory Visit: Payer: Self-pay

## 2020-09-27 ENCOUNTER — Other Ambulatory Visit: Payer: Medicare Other

## 2020-09-27 ENCOUNTER — Other Ambulatory Visit: Payer: Medicare Other | Admitting: Nurse Practitioner

## 2020-09-27 IMAGING — XA DG MYELOGRAPHY LUMBAR INJ LUMBOSACRAL
13 of 19 series · 13 of 19 positions shown · non-contrast
Comparison: None.

CLINICAL DATA: Chronic right-sided buttock pain radiating down the
back of the right leg to the foot.
TECHNIQUE: Contiguous axial images were obtained through the lumbar spine after
the intrathecal infusion of contrast. Coronal and sagittal
reconstructions were obtained of the axial image sets.

[Series 1: w lumbar spine lat · 0.15mm/px · 1 of 1 slices shown]
[im 1/1]
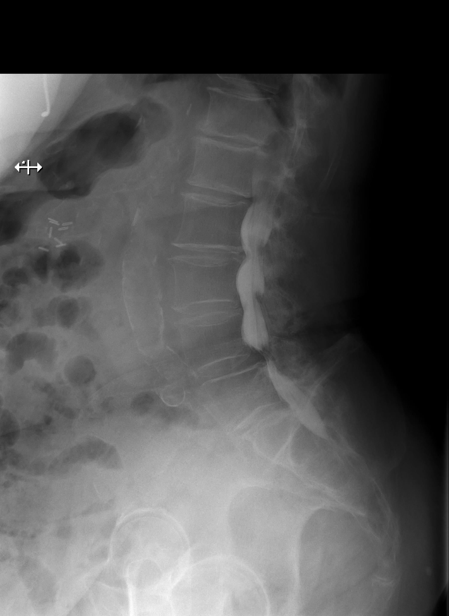

[Series 3: vasc standard · 1 of 1 slices shown (1 of 11)]
[im 1/1]
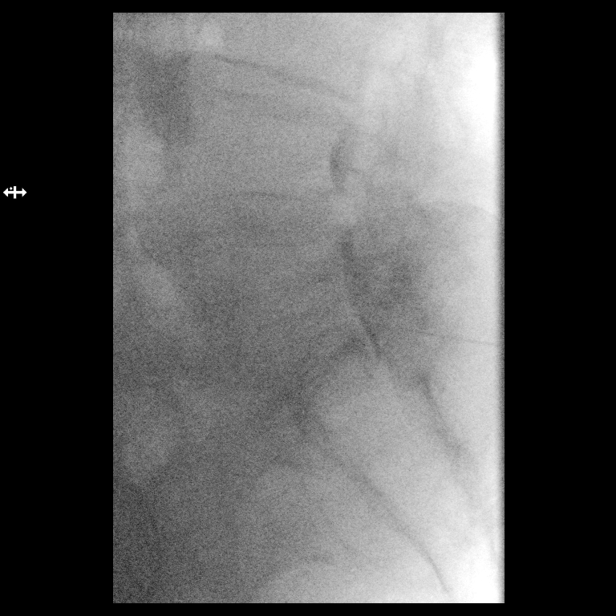

[Series 3: w lumbar spine extension · 0.15mm/px · 1 of 1 slices shown]
[im 1/1]
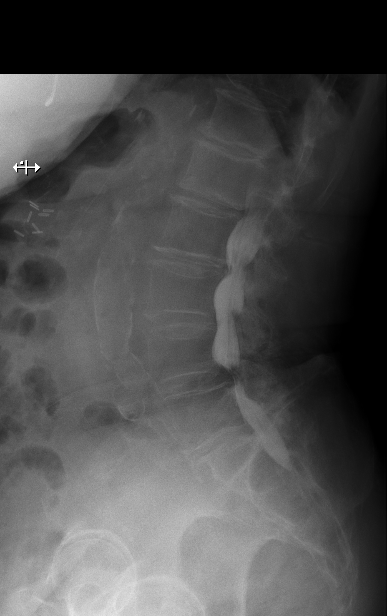

[Series 5: vasc standard · 1 of 1 slices shown (2 of 11)]
[im 1/1]
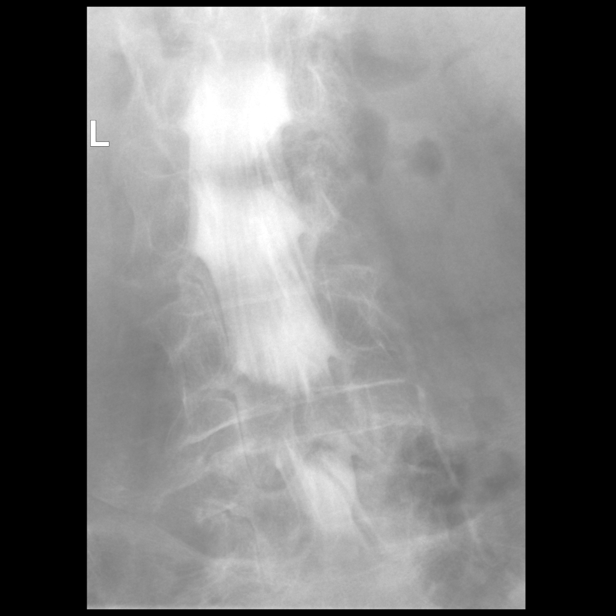

[Series 6: vasc standard · 1 of 1 slices shown (3 of 11)]
[im 1/1]
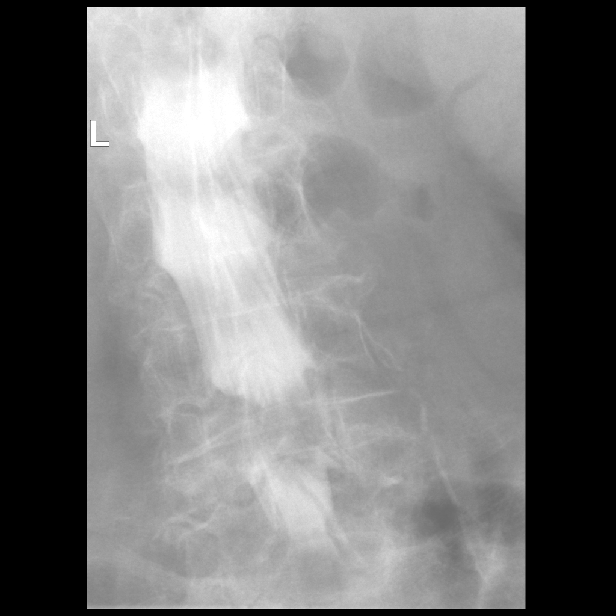

[Series 9: vasc standard · 1 of 1 slices shown (4 of 11)]
[im 1/1]
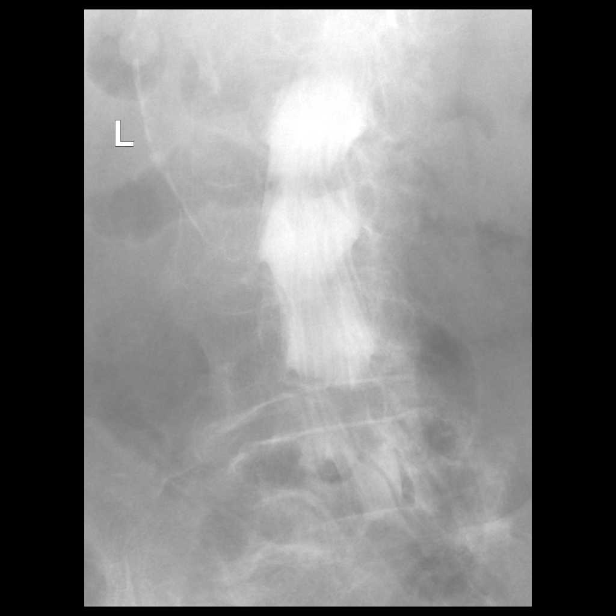

[Series 10: vasc standard · 1 of 1 slices shown (5 of 11)]
[im 1/1]
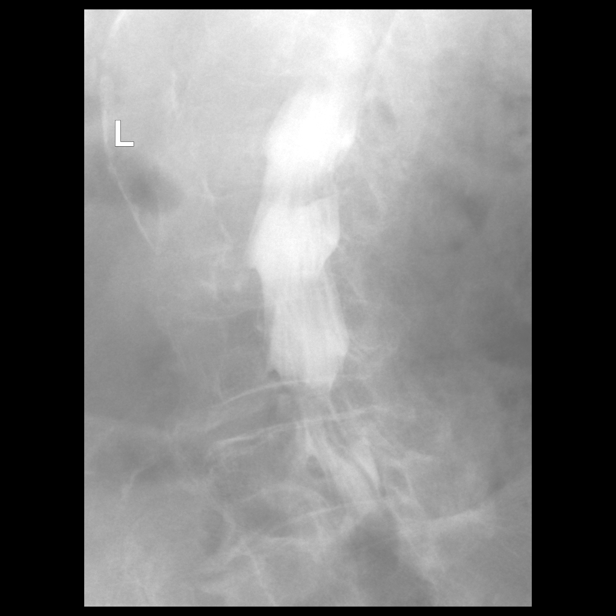

[Series 11: vasc standard · 1 of 1 slices shown (6 of 11)]
[im 1/1]
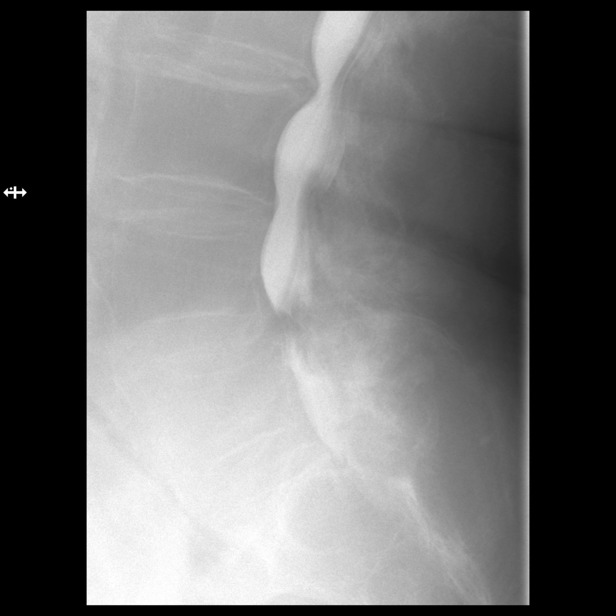

[Series 13: vasc standard · 1 of 1 slices shown (7 of 11)]
[im 1/1]
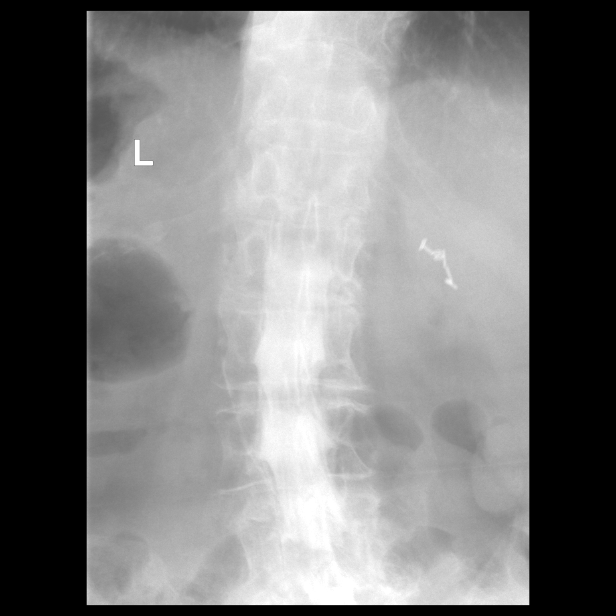

[Series 14: vasc standard · 1 of 1 slices shown (8 of 11)]
[im 1/1]
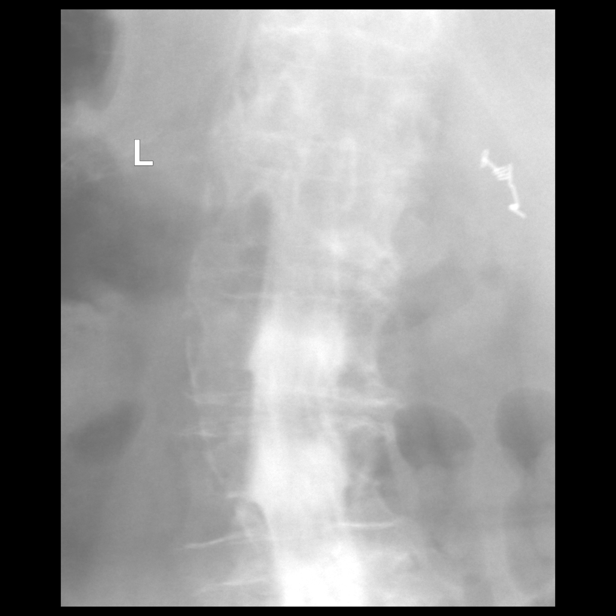

[Series 16: vasc standard · 1 of 1 slices shown (9 of 11)]
[im 1/1]
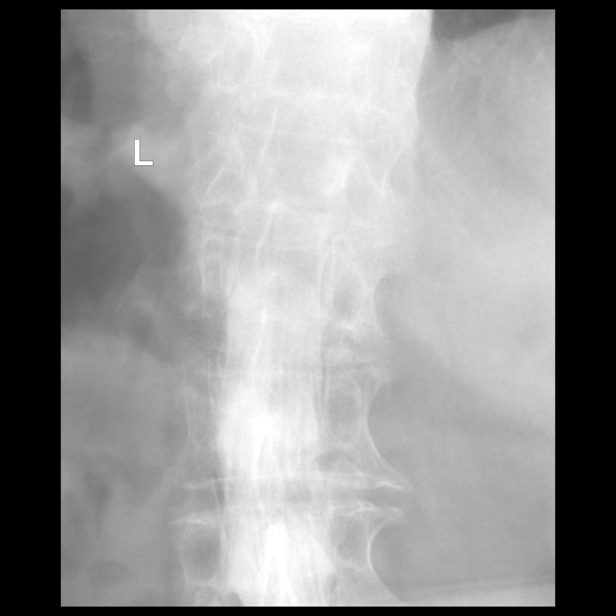

[Series 17: vasc standard · 1 of 1 slices shown (10 of 11)]
[im 1/1]
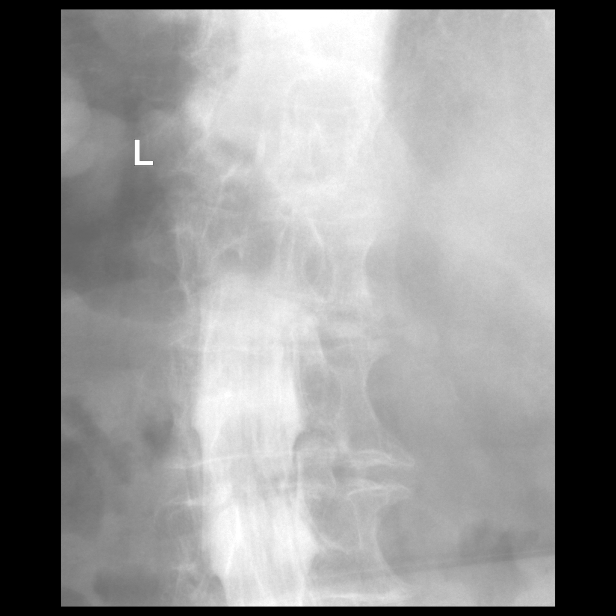

[Series 19: vasc standard · 1 of 1 slices shown (11 of 11)]
[im 1/1]
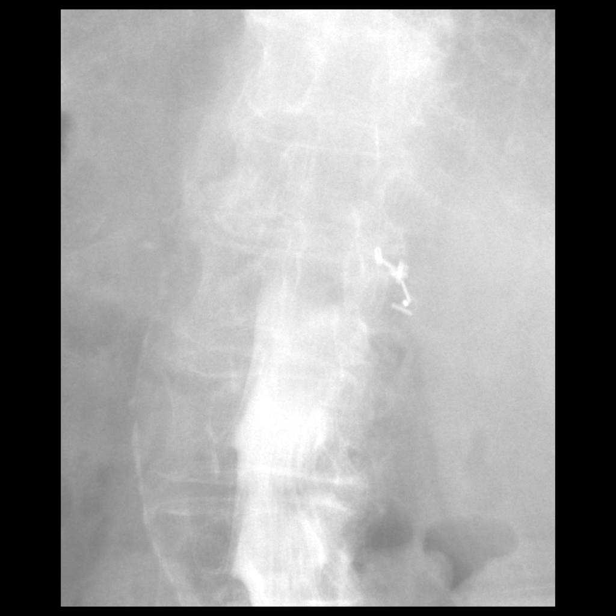

[13 of 19 positions shown; findings below may reference images not displayed]

EXAM:
LUMBAR MYELOGRAM

CT LUMBAR MYELOGRAM

FLUOROSCOPY TIME:  Radiation Exposure Index (as provided by the
fluoroscopic device): 24.9 mGy

Fluoroscopy Time:  38 seconds

Number of Acquired Images:

PROCEDURE:
After thorough discussion of risks and benefits of the procedure
including bleeding, infection, injury to nerves, blood vessels,
adjacent structures as well as headache and CSF leak, written and
oral informed consent was obtained. Consent was obtained by Dr.
Kushttrim Apelt. Time out form was completed.

Patient was positioned prone on the fluoroscopy table. Local
anesthesia was provided with 1% lidocaine without epinephrine after
prepped and draped in the usual sterile fashion. Puncture was
performed at S1-S2 using a 5 inch 22-gauge spinal needle via left
paramedian approach. Using a single pass through the dura, the
needle was placed within the thecal sac, with return of clear CSF.
15 mL of Isovue S-X99 was injected into the thecal sac, with normal
opacification of the nerve roots and cauda equina consistent with
free flow within the subarachnoid space.

I personally performed the lumbar puncture and administered the
intrathecal contrast. I also personally supervised acquisition of
the myelogram images.
FINDINGS: LUMBAR MYELOGRAM FINDINGS:

Twelve rib-bearing thoracic vertebral bodies on prior chest x-rays.
Therefore, there are six lumbar type vertebral bodies, with a fully
lumbarized S1 vertebral body. The last well-formed disc space is
labeled S1-S2.

4 mm anterolisthesis at L5-S1 increases to 6 mm with standing,
flexion, and extension. Small ventral extradural defect at L3-L4
with right lateral recess effacement. Large ventral extradural
defect at L5-S1 with severe spinal canal stenosis.

CT LUMBAR MYELOGRAM FINDINGS:

Segmentation: 6 lumbar type vertebral bodies, with a fully
lumbarized S1 vertebral body. The last well-formed disc space is
labeled S1-S2 for the purposes of the study.

Alignment: Slight levoscoliosis.  4 mm anterolisthesis at L5-S1.

Vertebrae: No acute fracture or other focal pathologic process.

Conus medullaris and cauda equina: Conus extends to the L1 level.
Conus and cauda equina appear normal.

Paraspinal and other soft tissues: Aortoiliac atherosclerotic
vascular disease. Prior cholecystectomy.

Disc levels:

T12-L1:  Minimal disc bulging.  No stenosis.

L1-L2:  Mild disc bulging.  No stenosis.

L2-L3:  Minimal disc bulging.  No stenosis.

L3-L4: Right eccentric disc bulging with right-sided disc osteophyte
complex. Mild bilateral facet arthropathy. Moderate right lateral
recess stenosis. Mild spinal canal and right neuroforaminal
stenosis. No left neuroforaminal stenosis.

L4-L5: Minimal disc bulging. Moderate left and mild right facet
arthropathy. Mild spinal canal stenosis. No neuroforaminal stenosis.

L5-S1: Disc uncovering and disc bulging with severe bilateral facet
arthropathy. Severe spinal canal and lateral recess stenosis. Mild
left neuroforaminal stenosis with disc encroaching on the exiting
left L5 nerve root. No right neuroforaminal stenosis.

S1-S2: Negative disc. Severe bilateral facet arthropathy. No
stenosis.
IMPRESSION: 1. Transitional lumbosacral anatomy with fully lumbarized S1
vertebral body. Correlation with radiographs is recommended prior to
any operative intervention.
2. Severe spinal canal stenosis at L5-S1. Grade 1 anterolisthesis at
this level mildly worsens with standing.
3. Mild spinal canal stenosis at L3-L4 and L4-L5. Moderate right
lateral recess stenosis at L3-L4 could affect the descending right
L4 nerve root.
4. Aortic atherosclerosis ([id number]).

## 2020-09-28 LAB — TSH+FREE T4
Free T4: 0.97 ng/dL (ref 0.82–1.77)
TSH: 7.06 u[IU]/mL — ABNORMAL HIGH (ref 0.450–4.500)

## 2020-09-29 ENCOUNTER — Other Ambulatory Visit: Payer: Self-pay

## 2020-09-29 DIAGNOSIS — R7989 Other specified abnormal findings of blood chemistry: Secondary | ICD-10-CM

## 2020-09-29 DIAGNOSIS — Z79899 Other long term (current) drug therapy: Secondary | ICD-10-CM

## 2020-09-30 ENCOUNTER — Other Ambulatory Visit: Payer: Self-pay

## 2020-09-30 ENCOUNTER — Ambulatory Visit (HOSPITAL_COMMUNITY): Payer: Medicare Other | Attending: Internal Medicine

## 2020-09-30 DIAGNOSIS — Z950 Presence of cardiac pacemaker: Secondary | ICD-10-CM | POA: Diagnosis present

## 2020-09-30 DIAGNOSIS — I495 Sick sinus syndrome: Secondary | ICD-10-CM | POA: Insufficient documentation

## 2020-09-30 DIAGNOSIS — I480 Paroxysmal atrial fibrillation: Secondary | ICD-10-CM | POA: Insufficient documentation

## 2020-09-30 DIAGNOSIS — I10 Essential (primary) hypertension: Secondary | ICD-10-CM | POA: Diagnosis present

## 2020-09-30 DIAGNOSIS — R0602 Shortness of breath: Secondary | ICD-10-CM | POA: Insufficient documentation

## 2020-09-30 LAB — ECHOCARDIOGRAM COMPLETE
Area-P 1/2: 2.6 cm2
S' Lateral: 2.8 cm

## 2020-09-30 MED ORDER — PERFLUTREN LIPID MICROSPHERE
1.0000 mL | INTRAVENOUS | Status: AC | PRN
  Administered 2020-09-30: 2 mL via INTRAVENOUS

## 2020-10-01 ENCOUNTER — Ambulatory Visit: Payer: Medicare Other | Admitting: Nurse Practitioner

## 2020-10-01 ENCOUNTER — Other Ambulatory Visit: Payer: Self-pay

## 2020-10-01 ENCOUNTER — Encounter: Payer: Self-pay | Admitting: Nurse Practitioner

## 2020-10-01 VITALS — BP 122/74 | HR 61 | Temp 96.8°F | Ht 64.0 in | Wt 187.0 lb

## 2020-10-01 DIAGNOSIS — J309 Allergic rhinitis, unspecified: Secondary | ICD-10-CM

## 2020-10-01 DIAGNOSIS — M48062 Spinal stenosis, lumbar region with neurogenic claudication: Secondary | ICD-10-CM

## 2020-10-01 DIAGNOSIS — F411 Generalized anxiety disorder: Secondary | ICD-10-CM

## 2020-10-01 DIAGNOSIS — I480 Paroxysmal atrial fibrillation: Secondary | ICD-10-CM

## 2020-10-01 DIAGNOSIS — E785 Hyperlipidemia, unspecified: Secondary | ICD-10-CM

## 2020-10-01 DIAGNOSIS — Z23 Encounter for immunization: Secondary | ICD-10-CM | POA: Diagnosis not present

## 2020-10-01 DIAGNOSIS — I10 Essential (primary) hypertension: Secondary | ICD-10-CM

## 2020-10-01 DIAGNOSIS — R0602 Shortness of breath: Secondary | ICD-10-CM

## 2020-10-01 DIAGNOSIS — K219 Gastro-esophageal reflux disease without esophagitis: Secondary | ICD-10-CM

## 2020-10-01 DIAGNOSIS — M810 Age-related osteoporosis without current pathological fracture: Secondary | ICD-10-CM

## 2020-10-01 DIAGNOSIS — R7989 Other specified abnormal findings of blood chemistry: Secondary | ICD-10-CM

## 2020-10-01 MED ORDER — ESOMEPRAZOLE MAGNESIUM 20 MG PO CPDR: 20.0000 mg | DELAYED_RELEASE_CAPSULE | ORAL | 1 refills | Status: DC | PRN

## 2020-10-01 NOTE — Progress Notes (Signed)
Careteam: Patient Care Team: Lauree Chandler, NP as PCP - General (Geriatric Medicine) Martinique, Peter M, MD as PCP - Cardiology (Cardiology) Mcarthur Rossetti, MD as Attending Physician (Orthopedic Surgery) Larey Dresser, MD as Attending Physician (Cardiology) Thornell Sartorius, MD as Consulting Physician (Otolaryngology) Druscilla Brownie, MD as Consulting Physician (Dermatology) Calvert Cantor, MD as Consulting Physician (Ophthalmology) Debara Pickett Nadean Corwin, MD as Consulting Physician (Cardiology) Eustace Moore, MD as Consulting Physician (Neurosurgery)  PLACE OF SERVICE:  [place]  Advanced Directive information Does Patient Have a Medical Advance Directive?: Yes, Type of Advance Directive: Louise;Out of facility DNR (pink MOST or yellow form), Pre-existing out of facility DNR order (yellow form or pink MOST form): Yellow form placed in chart (order not valid for inpatient use);Pink MOST form placed in chart (order not valid for inpatient use), Does patient want to make changes to medical advance directive?: No - Patient declined  Allergies  Allergen Reactions   Advil [Ibuprofen]     GI upset   Aleve [Naproxen Sodium]     GI upset   Aspirin Nausea And Vomiting    Takes baby aspirin qod without problems   Atorvastatin     Leg cramps   Celebrex [Celecoxib]     GI upset   Diclofenac     GI upset   Doxycycline     Headache   Fosamax [Alendronate Sodium]     GI discomfort    Metoclopramide Hcl     REACTION: heart palps   Nsaids Other (See Comments)    Tears my stomach up    Other     Antihistamine - increase BP   Pravastatin     Leg cramps   Timolol     Dropped HR    Chief Complaint  Patient presents with   Medical Management of Chronic Issues    6 month follow-up and flu vaccine today. Discuss correspondence from [facility].      HPI: Patient is a 86 y.o. female for routine follow up.   Got covid booster a few  days ago.   Osteoporosis- continues on cal and vit D with fosamax  Anxiety- uses xanax if needed - rarely uses   Insomnia- on trazodone to help sleep. Only sleeps 3-4 hours a night.   htn- maintained on valsartan-hctz and metoprolol.   Elevated TSH- free T4 normal. To follow up tsh and t3 and t4 in 3-4 months. Dr Martinique has scheduled.   Reports she is having worsening shortness of breath, had labs and echo done- echo was without change.  Xray showed changes consistent with COPD- however without hx of COPD. Following back with Dr Martinique in 2 weeks.  Pt contributes to age. She continues to stay busy and challenges her mind. Has been going on for 2-3 months.   She has incontinence , attempts to use pads- continues to get worse.  Mother used to take b12 injection due to being fatigue.   GERD- well controlled, uses nexium PRN and works well   A fib- s/p pacemaker- continues on eliquis BID with metoprolol BID   Review of Systems:  Review of Systems  Constitutional: Negative for chills, fever and weight loss.  HENT: Positive for congestion. Negative for tinnitus.   Respiratory: Positive for shortness of breath. Negative for cough and sputum production.   Cardiovascular: Negative for chest pain, palpitations and leg swelling.  Gastrointestinal: Negative for abdominal pain, constipation, diarrhea and heartburn.  Genitourinary: Negative for  dysuria, frequency and urgency.  Musculoskeletal: Negative for back pain, falls, joint pain and myalgias.  Skin: Negative.   Neurological: Negative for dizziness and headaches.  Psychiatric/Behavioral: Negative for depression and memory loss. The patient has insomnia.    Past Medical History:  Diagnosis Date   Allergic rhinitis due to pollen    Anxiety state, unspecified    Sitautional- pain   Arthritis    "right knee" (06/21/2016)   Asymptomatic varicose veins    Carpal tunnel syndrome    Chest pain, unspecified    Chronic lower back  pain    "on the left side" (06/21/2016)   Constipation    Cramp of limb    Depressive disorder, not elsewhere classified    Diaphragmatic hernia without mention of obstruction or gangrene    Diaphragmatic hernia without mention of obstruction or gangrene    Disturbance of skin sensation    Diverticulosis of colon (without mention of hemorrhage)    Dysrhythmia    Atrial Flutter   Enthesopathy of hip region    Esophageal reflux    , just occasional   History of blood transfusion    "w/both knee replacements"   History of duodenal ulcer    History of kidney stones    passed   Insomnia, unspecified    Lumbago    Migraine    "none since ~ 1990" (06/21/2016)   Myalgia and myositis, unspecified    Obesity, unspecified    Other abnormal blood chemistry    Other dysphagia    Other malaise and fatigue    Other nonspecific abnormal serum enzyme levels    Other specified cardiac dysrhythmias(427.89)    Pacemaker    Pain in joint, ankle and foot    Pain in joint, hand    Pain in joint, lower leg    Pain in joint, pelvic region and thigh    Pain in limb    Plantar fascial fibromatosis    Presence of permanent cardiac pacemaker    -St Jude   Reflux esophagitis    Sciatica    Spinal stenosis, unspecified region other than cervical    Stricture and stenosis of esophagus    Symptomatic menopausal or female climacteric states    Unspecified essential hypertension    Unspecified essential hypertension    Unspecified vitamin D deficiency    Past Surgical History:  Procedure Laterality Date   BREAST BIOPSY Left 1990s X 2   CARDIOVERSION N/A 04/26/2016   Procedure: CARDIOVERSION;  Surgeon: Pixie Casino, MD;  Location: [place];  Service: Cardiovascular;  Laterality: N/A;   CATARACT EXTRACTION W/ INTRAOCULAR LENS IMPLANT Left 08/03/1999   DR EPES    CATARACT EXTRACTION W/ INTRAOCULAR LENS IMPLANT Right 2000   DR EPES   CHOLECYSTECTOMY  OPEN  1989   DR BOWMAN   COLONOSCOPY  1988   Normal    DENTAL SURGERY Left 08/2016   EP IMPLANTABLE DEVICE N/A 06/21/2016   Procedure: Pacemaker Implant;  Surgeon: Evans Lance, MD;  Location: [facility];  Service: Cardiovascular;  Laterality: N/A;   ESOPHAGOGASTRODUODENOSCOPY (EGD) WITH ESOPHAGEAL DILATION  ~ 1982   Dr. Sharlett Iles   EXCISION OF ACTINIC KERATOSIS     DR LUPTON    EYE SURGERY     INSERT / REPLACE / REMOVE PACEMAKER     JOINT REPLACEMENT     KNEE ARTHROSCOPY Left 2003   KNEE ARTHROSCOPY Right 06-26-13   KNEE CLOSED REDUCTION Right 10/23/2013   Procedure:  CLOSED MANIPULATION RIGHT KNEE;  Surgeon: Mcarthur Rossetti, MD;  Location: [place];  Service: Orthopedics;  Laterality: Right;   LASER FOR CLOUDY CAP LEFT EYE Left 03/2006   DR DIGBY   LUMBAR LAMINECTOMY/DECOMPRESSION MICRODISCECTOMY N/A 06/12/2019   Procedure: Laminectomy and Foraminotomy - Lumbar four-Lumbar five;  Surgeon: Eustace Moore, MD;  Location: [place];  Service: Neurosurgery;  Laterality: N/A;   [facility], Dr.Gray. Removed area on upper chest   TOTAL KNEE ARTHROPLASTY Left 04/2004   DR RENDALL   TOTAL KNEE ARTHROPLASTY Right 07/04/2013   Procedure: RIGHT TOTAL KNEE ARTHROPLASTY;  Surgeon: Mcarthur Rossetti, MD;  Location: [facility];  Service: Orthopedics;  Laterality: Right;   TRIGGER FINGER RELEASE Right 09/2018   VAGINAL HYSTERECTOMY  1979   Social History:   reports that she has never smoked. She has never used smokeless tobacco. She reports that she does not drink alcohol and does not use drugs.  Family History  Problem Relation Age of Onset   Ovarian cancer Mother    Uterine cancer Mother    Cerebrovascular Accident Mother    Heart disease Father    Hypertension Brother    Obesity Daughter     Medications: Patient's Medications  New Prescriptions   No medications on file  Previous Medications   ACETAMINOPHEN (TYLENOL) 500 MG  TABLET    Take 500-2,000 mg by mouth daily.    ALENDRONATE (FOSAMAX) 70 MG TABLET    TAKE 1 TABLET BY MOUTH EVERY 7 (SEVEN) DAYS. TAKE WITH A FULL GLASS OF WATER ON AN EMPTY STOMACH.   ALPRAZOLAM (XANAX) 0.25 MG TABLET    Take 0.5 tablets (0.125 mg total) by mouth daily as needed for anxiety.   ANTISEPTIC ORAL RINSE (BIOTENE) LIQD    15 mLs by Mouth Rinse route daily.    ARTIFICIAL SALIVA (ACT DRY MOUTH) LOZG    Use as directed 1 drop in the mouth or throat as needed (dry mouth).   BIMATOPROST (LUMIGAN) 0.01 % SOLN    Place 1 drop into both eyes at bedtime.    CALCIUM CARB-CHOLECALCIFEROL (CALTRATE 600+D3) 600-800 MG-UNIT TABS    1 tablet twice daily   CHOLECALCIFEROL (VITAMIN D) 2000 UNITS TABLET    Take 2,000 Units by mouth daily.   DORZOLAMIDE (TRUSOPT) 2 % OPHTHALMIC SOLUTION    Place 1 drop into both eyes 3 (three) times daily.    ELIQUIS 5 MG TABS TABLET    TAKE 1 TABLET BY MOUTH TWICE A DAY   ESOMEPRAZOLE (NEXIUM) 20 MG CAPSULE    Take 20 mg by mouth as needed.   EZETIMIBE (ZETIA) 10 MG TABLET    TAKE 1 TABLET ONCE DAILY.   FLUTICASONE (FLONASE) 50 MCG/ACT NASAL SPRAY    INHALE 1 SPRAY INTO BOTH NOSTRILS DAILY   METHYLCELLULOSE (ARTIFICIAL TEARS) 1 % OPHTHALMIC SOLUTION    Place 1 drop into both eyes daily as needed (for dry eyes).    METOPROLOL TARTRATE (LOPRESSOR) 50 MG TABLET    TAKE 1 TABLET BY MOUTH TWICE A DAY   PROPAFENONE (RYTHMOL SR) 225 MG 12 HR CAPSULE    TAKE 1 CAPSULE BY MOUTH 2 TIMES DAILY.   TIZANIDINE (ZANAFLEX) 2 MG CAPSULE    TAKE 1 CAPSULE (2 MG TOTAL) BY MOUTH AT BEDTIME.   TRAZODONE (DESYREL) 50 MG TABLET    TAKE 1 TABLET BY MOUTH EVERYDAY AT BEDTIME   VALSARTAN-HYDROCHLOROTHIAZIDE (DIOVAN-HCT) 320-25 MG TABLET    TAKE 1  TABLET ONCE DAILY.  Modified Medications   No medications on file  Discontinued Medications   No medications on file    Physical Exam:  Vitals:   10/01/20 1420  BP: 122/74  Pulse: 61  Temp: (!) 96.8 F (36 C)  TempSrc: Temporal  SpO2: 95%   Weight: 187 lb (84.8 kg)  Height: 5\' 4"  (1.626 m)   There is no height or weight on file to calculate BMI. Wt Readings from Last 3 Encounters:  09/16/20 186 lb 9.6 oz (84.6 kg)  04/21/20 183 lb 3.2 oz (83.1 kg)  04/02/20 187 lb (84.8 kg)    Physical Exam Constitutional:      General: She is not in acute distress.    Appearance: She is well-developed. She is not diaphoretic.  HENT:     Head: Normocephalic and atraumatic.     Right Ear: There is no impacted cerumen.     Left Ear: There is no impacted cerumen.     Nose: Congestion and rhinorrhea present.     Mouth/Throat:     Pharynx: No oropharyngeal exudate.  Eyes:     Conjunctiva/sclera: Conjunctivae normal.     Pupils: Pupils are equal, round, and reactive to light.  Cardiovascular:     Rate and Rhythm: Normal rate and regular rhythm.     Heart sounds: Normal heart sounds.  Pulmonary:     Effort: Pulmonary effort is normal.     Breath sounds: Normal breath sounds.  Abdominal:     General: Bowel sounds are normal.     Palpations: Abdomen is soft.  Musculoskeletal:        General: No tenderness.     Cervical back: Normal range of motion and neck supple.  Skin:    General: Skin is warm and dry.  Neurological:     Mental Status: She is alert and oriented to person, place, and time.  Psychiatric:        Mood and Affect: Mood normal.        Behavior: Behavior normal.    Labs reviewed: Basic Metabolic Panel: Recent Labs    03/30/20 0931 09/17/20 0811 09/21/20 0816  NA 139 140  --   K 3.6 4.0  --   CL 103 101  --   CO2 31 26  --   GLUCOSE 98 92  --   BUN 17 18  --   CREATININE 0.74 0.81  --   CALCIUM 9.2 9.5  --   TSH  --  6.200* 7.060*   Liver Function Tests: Recent Labs    03/30/20 0931  AST 18  ALT 18  BILITOT 0.5  PROT 6.5   No results for input(s): LIPASE, AMYLASE in the last 8760 hours. No results for input(s): AMMONIA in the last 8760 hours. CBC: Recent Labs    03/30/20 0931 09/17/20 0811   WBC 4.5 5.0  NEUTROABS 2,102 2.2  HGB 12.8 13.1  HCT 39.1 38.5  MCV 92.7 93  PLT 179 180   Lipid Panel: Recent Labs    03/30/20 0931  CHOL 153  HDL 39*  LDLCALC 86  TRIG 183*  CHOLHDL 3.9   TSH: Recent Labs    09/17/20 0811 09/21/20 0816  TSH 6.200* 7.060*   A1C: Lab Results  Component Value Date   HGBA1C 5.2 08/06/2017     Assessment/Plan 1. Need for influenza vaccination - Flu Vaccine QUAD High Dose(Fluad)  2. Gastroesophageal reflux disease without esophagitis Controlled on nexium- take PRN but reports  this works well for her. - esomeprazole (NEXIUM) 20 MG capsule; Take 1 capsule (20 mg total) by mouth as needed.  Dispense: 30 capsule; Refill: 1  3. Essential hypertension -well controlled on valsartan-hctz and metoprolol   4. Paroxysmal atrial fibrillation (HCC) Rate controlled on metoprolol BID and propafenone BID with eliquis 5 mg BID   5. Osteoporosis, unspecified osteoporosis type, unspecified pathological fracture presence Continues on fosamax weekly with cal and vit d  6. Anxiety state Controlled at this time, occasionally needs xanax PRN  7. Spinal stenosis of lumbar region with neurogenic claudication Improved at this time.   8. Hyperlipidemia, unspecified hyperlipidemia type -continues on zetia 10 mg daily with dietary modifications.   9. Shortness of breath Ongoing shortness of breath over the last 3 months, has been followed by cardiologist with EKG, labs, chest xray and echo without acute findings. She has follow up scheduled with Dr Martinique in 2 weeks.   10. Elevated TSH - TSH; Future - T4, Free; Future - T3, Free; Future  11. Allergic rhinitis -to restart flonase 1 spray into bilateral nares BID -zyrtec 10 mg by mouth daily    Next appt: 6 months.  Carlos American. [place], [facility] [phone]

## 2020-10-01 NOTE — Patient Instructions (Addendum)
To start zyrtec 10 mg daily ( you can use generic) this is OTC for allergies To restart flonase 1 spray into both nares 1-2 times daily for nasal congestion  Okay to take b12 1000 mcg daily    To schedule lab appt here in 3-4 months to recheck TSH

## 2020-10-05 ENCOUNTER — Encounter: Payer: Self-pay | Admitting: Nurse Practitioner

## 2020-10-07 NOTE — Progress Notes (Signed)
Cardiology Office Note   Date:  10/13/2020   ID:  Verina, Galeno 08-Jan-1931, MRN [id number]  PCP:  Lauree Chandler, NP  Cardiologist:   Gorgeous Newlun Martinique, MD   No chief complaint on file.     History of Present Illness: [name] is a 86 y.o. female is seen for follow up Afib and dyspnea. She has a long history of marked sinus bradycardia and atrial fibrillation/flutter. She was seen initially in 2014. Echo at that time showed mild LAE otherwise normal. Holter showed mean HR 59 with lowest HR 43 and peak HR 109.    On March 9,2017 she noted an increased HR by BP monitor up to 153. At this time she felt marked indigestion from her waist to her neck. She felt her heart fluttering.   She was seen by Dr. Nyoka Cowden on 03/29/16 and found to be in Afib with RVR. She was started on Eliquis and metoprolol. Myoview study and Echo were normal.   She later had attempt at DCCV. She was in an atypical atrial flutter at that time. DCCV resulted in very prolonged pauses > 6 seconds and return to flutter. She was seen by Dr. Curt Bears and placed on flecainide. This did convert her to NSR but made her feel very sick with nausea, dizziness, and extreme fatigue. Flecainide was discontinued.. She continued to have marked bradycardia and underwent PPM placement on 06/21/16. When seen in September 2018 by Dr. Lovena Le she had an Afib burden of 94%. She was started on propafenone for her Afib. Initially this medication caused her to have more nausea but this has improved.  On her pacemaker checks her Afib burden is less than 1% since December 2019. Last pacemaker check in June was normal.  In June 2020 she underwent L4-5 lumbar laminectomy by Dr Ronnald Ramp. This went well. She just notices a little numbness in her leg now.  When seen last month she noted that for 3 months she has experienced increased symptoms of SOB and fatigue. She likes to stay active but this has really become more difficult. At night she will get a  sudden sensation under her left ribs and has to catch her breath. No edema. No cough or chest pain. CXR showed stable COPD without acute findings. Labs were ok except mild thyroid abnormality. Echo looked very good.   She is working on walking daily. Doing deep breathing. Trying to lose weight. Thinks breathing is a little better.    Past Medical History:  Diagnosis Date   Allergic rhinitis due to pollen    Anxiety state, unspecified    Sitautional- pain   Arthritis    "right knee" (06/21/2016)   Asymptomatic varicose veins    Carpal tunnel syndrome    Chest pain, unspecified    Chronic lower back pain    "on the left side" (06/21/2016)   Constipation    Cramp of limb    Depressive disorder, not elsewhere classified    Diaphragmatic hernia without mention of obstruction or gangrene    Diaphragmatic hernia without mention of obstruction or gangrene    Disturbance of skin sensation    Diverticulosis of colon (without mention of hemorrhage)    Dysrhythmia    Atrial Flutter   Enthesopathy of hip region    Esophageal reflux    , just occasional   History of blood transfusion    "w/both knee replacements"   History of duodenal ulcer    History of kidney  stones    passed   Insomnia, unspecified    Lumbago    Migraine    "none since ~ 1990" (06/21/2016)   Myalgia and myositis, unspecified    Obesity, unspecified    Other abnormal blood chemistry    Other dysphagia    Other malaise and fatigue    Other nonspecific abnormal serum enzyme levels    Other specified cardiac dysrhythmias(427.89)    Pacemaker    Pain in joint, ankle and foot    Pain in joint, hand    Pain in joint, lower leg    Pain in joint, pelvic region and thigh    Pain in limb    Plantar fascial fibromatosis    Presence of permanent cardiac pacemaker    -St Jude   Reflux esophagitis    Sciatica    Spinal stenosis, unspecified region other than cervical    Stricture  and stenosis of esophagus    Symptomatic menopausal or female climacteric states    Unspecified essential hypertension    Unspecified essential hypertension    Unspecified vitamin D deficiency     Past Surgical History:  Procedure Laterality Date   BREAST BIOPSY Left 1990s X 2   CARDIOVERSION N/A 04/26/2016   Procedure: CARDIOVERSION;  Surgeon: Pixie Casino, MD;  Location: [facility];  Service: Cardiovascular;  Laterality: N/A;   CATARACT EXTRACTION W/ INTRAOCULAR LENS IMPLANT Left 08/03/1999   DR EPES    CATARACT EXTRACTION W/ INTRAOCULAR LENS IMPLANT Right 2000   DR EPES   CHOLECYSTECTOMY OPEN  1989   DR BOWMAN   COLONOSCOPY  1988   Normal    DENTAL SURGERY Left 08/2016   EP IMPLANTABLE DEVICE N/A 06/21/2016   Procedure: Pacemaker Implant;  Surgeon: Evans Lance, MD;  Location: [facility];  Service: Cardiovascular;  Laterality: N/A;   ESOPHAGOGASTRODUODENOSCOPY (EGD) WITH ESOPHAGEAL DILATION  ~ 1982   Dr. Sharlett Iles   EXCISION OF ACTINIC KERATOSIS     DR LUPTON    EYE SURGERY     INSERT / REPLACE / REMOVE PACEMAKER     JOINT REPLACEMENT     KNEE ARTHROSCOPY Left 2003   KNEE ARTHROSCOPY Right 06-26-13   KNEE CLOSED REDUCTION Right 10/23/2013   Procedure: CLOSED MANIPULATION RIGHT KNEE;  Surgeon: Mcarthur Rossetti, MD;  Location: [facility];  Service: Orthopedics;  Laterality: Right;   LASER FOR CLOUDY CAP LEFT EYE Left 03/2006   DR DIGBY   LUMBAR LAMINECTOMY/DECOMPRESSION MICRODISCECTOMY N/A 06/12/2019   Procedure: Laminectomy and Foraminotomy - Lumbar four-Lumbar five;  Surgeon: Eustace Moore, MD;  Location: [facility];  Service: Neurosurgery;  Laterality: N/A;   [facility], Dr.Gray. Removed area on upper chest   TOTAL KNEE ARTHROPLASTY Left 04/2004   DR RENDALL   TOTAL KNEE ARTHROPLASTY Right 07/04/2013   Procedure: RIGHT TOTAL KNEE ARTHROPLASTY;  Surgeon: Mcarthur Rossetti, MD;  Location: [facility];  Service:  Orthopedics;  Laterality: Right;   TRIGGER FINGER RELEASE Right 09/2018   VAGINAL HYSTERECTOMY  1979     Current Outpatient Medications  Medication Sig Dispense Refill   acetaminophen (TYLENOL) 500 MG tablet Take 500-2,000 mg by mouth daily.      alendronate (FOSAMAX) 70 MG tablet TAKE 1 TABLET BY MOUTH EVERY 7 (SEVEN) DAYS. TAKE WITH A FULL GLASS OF WATER ON AN EMPTY STOMACH. 12 tablet 3   ALPRAZolam (XANAX) 0.25 MG tablet Take 0.5 tablets (0.125 mg total) by mouth daily  as needed for anxiety. 30 tablet 1   antiseptic oral rinse (BIOTENE) LIQD 15 mLs by Mouth Rinse route daily.      Artificial Saliva (ACT DRY MOUTH) LOZG Use as directed 1 drop in the mouth or throat as needed (dry mouth).     bimatoprost (LUMIGAN) 0.01 % SOLN Place 1 drop into both eyes at bedtime.      Calcium Carb-Cholecalciferol (CALTRATE 600+D3) 600-800 MG-UNIT TABS 1 tablet twice daily 60 tablet 0   Cholecalciferol (VITAMIN D) 2000 UNITS tablet Take 2,000 Units by mouth daily.     dorzolamide (TRUSOPT) 2 % ophthalmic solution Place 1 drop into both eyes 3 (three) times daily.      ELIQUIS 5 MG TABS tablet TAKE 1 TABLET BY MOUTH TWICE A DAY 180 tablet 3   esomeprazole (NEXIUM) 20 MG capsule Take 1 capsule (20 mg total) by mouth as needed. 30 capsule 1   ezetimibe (ZETIA) 10 MG tablet TAKE 1 TABLET ONCE DAILY. 90 tablet 0   fluticasone (FLONASE) 50 MCG/ACT nasal spray INHALE 1 SPRAY INTO BOTH NOSTRILS DAILY 16 mL 6   methylcellulose (ARTIFICIAL TEARS) 1 % ophthalmic solution Place 1 drop into both eyes daily as needed (for dry eyes).      metoprolol tartrate (LOPRESSOR) 50 MG tablet TAKE 1 TABLET BY MOUTH TWICE A DAY 180 tablet 3   propafenone (RYTHMOL SR) 225 MG 12 hr capsule TAKE 1 CAPSULE BY MOUTH 2 TIMES DAILY. 60 capsule 5   tizanidine (ZANAFLEX) 2 MG capsule TAKE 1 CAPSULE (2 MG TOTAL) BY MOUTH AT BEDTIME. 15 capsule 0   traZODone (DESYREL) 50 MG tablet TAKE 1 TABLET BY MOUTH EVERYDAY AT BEDTIME  30 tablet 3   valsartan-hydrochlorothiazide (DIOVAN-HCT) 320-25 MG tablet TAKE 1 TABLET ONCE DAILY. 90 tablet 0   No current facility-administered medications for this visit.    Allergies:   Advil [ibuprofen], Aleve [naproxen sodium], Aspirin, Atorvastatin, Celebrex [celecoxib], Diclofenac, Doxycycline, Fosamax [alendronate sodium], Metoclopramide hcl, Nsaids, Other, Pravastatin, and Timolol    Social History:  The patient  reports that she has never smoked. She has never used smokeless tobacco. She reports that she does not drink alcohol and does not use drugs.   Family History:  The patient's family history includes Cerebrovascular Accident in her mother; Heart disease in her father; Hypertension in her brother; Obesity in her daughter; Ovarian cancer in her mother; Uterine cancer in her mother.    ROS:  Please see the history of present illness.   Otherwise, review of systems are positive for none.   All other systems are reviewed and negative.    PHYSICAL EXAM: VS:  BP (!) 152/82    Pulse 61    Ht 5\' 3"  (1.6 m)    Wt 186 lb (84.4 kg)    SpO2 97%    BMI 32.95 kg/m  , BMI Body mass index is 32.95 kg/m. GENERAL:  Well appearing, obese WF in NAD HEENT:  PERRL, EOMI, sclera are clear. Oropharynx is clear. NECK:  No jugular venous distention, carotid upstroke brisk and symmetric, no bruits, no thyromegaly or adenopathy LUNGS:  clear  CHEST:  Unremarkable HEART:  RRR,  PMI not displaced or sustained,S1 and S2 within normal limits, no S3, no S4: no clicks, no rubs, no murmurs ABD:  Soft, nontender. BS +, no masses or bruits. No hepatomegaly, no splenomegaly EXT:  2 + pulses throughout, no edema, no cyanosis no clubbing SKIN:  Warm and dry.  No rashes NEURO:  Alert and oriented x 3. Cranial nerves II through XII intact. PSYCH:  Cognitively intact    EKG:  EKG is not ordered today.    Recent Labs: 03/30/2020: ALT 18 09/17/2020: BNP 59.0; BUN 18; Creatinine, Ser 0.81; Hemoglobin 13.1;  Platelets 180; Potassium 4.0; Sodium 140 09/21/2020: TSH 7.060    Lipid Panel    Component Value Date/Time   CHOL 153 03/30/2020 0931   CHOL 179 03/27/2016 0853   TRIG 183 (H) 03/30/2020 0931   HDL 39 (L) 03/30/2020 0931   HDL 42 03/27/2016 0853   CHOLHDL 3.9 03/30/2020 0931   VLDL 42 (H) 08/06/2017 0918   LDLCALC 86 03/30/2020 0931      Wt Readings from Last 3 Encounters:  10/13/20 186 lb (84.4 kg)  10/01/20 187 lb (84.8 kg)  09/16/20 186 lb 9.6 oz (84.6 kg)      Other studies Reviewed: Additional studies/ records that were reviewed today include:  Echo: 09/06/17: Study Conclusions  - Left ventricle: The cavity size was normal. Wall thickness was   increased in a pattern of mild LVH. Systolic function was normal.   The estimated ejection fraction was in the range of 55% to 60%.   Doppler parameters are consistent with both elevated ventricular   end-diastolic filling pressure and elevated left atrial filling   pressure. - Left atrium: The atrium was moderately dilated. - Atrial septum: There was increased thickness of the septum,   consistent with lipomatous hypertrophy. No defect or patent   foramen ovale was identified  Echo 10/7//21: IMPRESSIONS    1. Left ventricular ejection fraction, by estimation, is 55 to 60%. The  left ventricle has normal function. The left ventricle has no regional  wall motion abnormalities. There is mild left ventricular hypertrophy.  Left ventricular diastolic parameters  are consistent with Grade I diastolic dysfunction (impaired relaxation).  The E/e' is 16.  2. Right ventricular systolic function is normal. The right ventricular  size is normal.  3. Left atrial size was moderately dilated.  4. The mitral valve is grossly normal. No evidence of mitral valve  regurgitation.  5. The aortic valve is tricuspid. There is mild calcification of the  aortic valve. Aortic valve regurgitation is not visualized.  6. The inferior vena  cava is normal in size with greater than 50%  respiratory variability, suggesting right atrial pressure of 3 mmHg.   Comparison(s): No significant change from prior study.   ASSESSMENT AND PLAN:  1.   Atrial fibrillation/flutter with RVR.  Intolerant of flecainide.  Now s/p PPM placement for marked post conversion pauses. On propafenone with marked improvement in Afib burden. Followed by Dr Lovena Le. Will be seeing him in November. Will continue same. Continue Eliquis for anticoagulation.   2. HTN well controlled.  3. Mild hypercholesterolemia.   4. Shortness of breath. Etiology unclear. No evidence of  volume overload.  PE unlikely since on Eliquis.  Labs looked very good other than mildly elevated TSH. This will be followed up with primary care. BNP normal. CXR no acute findings. Echo essentially normal. Will continue to work on conditioning and weight control. If breathing gets worse may need to consider PFTs.    Signed, Chantry Headen Martinique, Gutierrez  10/13/2020 1:55 PM    [facility] [address], [place], [place], [address] Phone [phone], Fax [phone]

## 2020-10-10 ENCOUNTER — Other Ambulatory Visit: Payer: Self-pay | Admitting: Nurse Practitioner

## 2020-10-10 DIAGNOSIS — M8580 Other specified disorders of bone density and structure, unspecified site: Secondary | ICD-10-CM

## 2020-10-13 ENCOUNTER — Other Ambulatory Visit: Payer: Self-pay

## 2020-10-13 ENCOUNTER — Ambulatory Visit: Payer: Medicare Other | Admitting: Cardiology

## 2020-10-13 ENCOUNTER — Encounter: Payer: Self-pay | Admitting: Cardiology

## 2020-10-13 VITALS — BP 152/82 | HR 61 | Ht 63.0 in | Wt 186.0 lb

## 2020-10-13 DIAGNOSIS — I480 Paroxysmal atrial fibrillation: Secondary | ICD-10-CM

## 2020-10-13 DIAGNOSIS — R0602 Shortness of breath: Secondary | ICD-10-CM | POA: Diagnosis not present

## 2020-10-13 DIAGNOSIS — Z950 Presence of cardiac pacemaker: Secondary | ICD-10-CM | POA: Diagnosis not present

## 2020-10-13 DIAGNOSIS — I495 Sick sinus syndrome: Secondary | ICD-10-CM

## 2020-10-30 ENCOUNTER — Other Ambulatory Visit: Payer: Self-pay | Admitting: Nurse Practitioner

## 2020-10-31 ENCOUNTER — Other Ambulatory Visit: Payer: Self-pay | Admitting: Nurse Practitioner

## 2020-11-01 NOTE — Telephone Encounter (Signed)
Allergy alert when trying to refill medication.

## 2020-11-16 ENCOUNTER — Other Ambulatory Visit: Payer: Self-pay

## 2020-11-16 ENCOUNTER — Encounter: Payer: Self-pay | Admitting: Internal Medicine

## 2020-11-16 ENCOUNTER — Ambulatory Visit: Payer: Medicare Other | Admitting: Internal Medicine

## 2020-11-16 VITALS — BP 134/86 | HR 62 | Ht 63.0 in | Wt 185.0 lb

## 2020-11-16 DIAGNOSIS — I10 Essential (primary) hypertension: Secondary | ICD-10-CM | POA: Diagnosis not present

## 2020-11-16 DIAGNOSIS — Z950 Presence of cardiac pacemaker: Secondary | ICD-10-CM | POA: Diagnosis not present

## 2020-11-16 DIAGNOSIS — I495 Sick sinus syndrome: Secondary | ICD-10-CM | POA: Diagnosis not present

## 2020-11-16 DIAGNOSIS — I480 Paroxysmal atrial fibrillation: Secondary | ICD-10-CM | POA: Diagnosis not present

## 2020-11-16 MED ORDER — METOPROLOL TARTRATE 25 MG PO TABS: 25.0000 mg | ORAL_TABLET | Freq: Two times a day (BID) | ORAL | 3 refills | Status: DC

## 2020-11-16 NOTE — Progress Notes (Signed)
HPI [name] returns today for followup. She is a pleasant 86 yo woman with a h/o sinus node dysfunction and atrial fib with a RVR,s /p PPM insertion. She has been treated with propafenone and a beta blocker. She feels like she has a bubble under her left breast at night. No relation to exertion. She has some fatigue as well. She reminds me that I was going to adjust her medication.  Allergies  Allergen Reactions  . Advil [Ibuprofen]     GI upset  . Aleve [Naproxen Sodium]     GI upset  . Aspirin Nausea And Vomiting    Takes baby aspirin qod without problems  . Atorvastatin     Leg cramps  . Celebrex [Celecoxib]     GI upset  . Diclofenac     GI upset  . Doxycycline     Headache  . Fosamax [Alendronate Sodium]     GI discomfort   . Metoclopramide Hcl     REACTION: heart palps  . Nsaids Other (See Comments)    Tears my stomach up   . Other     Antihistamine - increase BP  . Pravastatin     Leg cramps  . Timolol     Dropped HR     Current Outpatient Medications  Medication Sig Dispense Refill  . valsartan-hydrochlorothiazide (DIOVAN-HCT) 320-25 MG tablet TAKE 1 TABLET ONCE DAILY. 90 tablet 1  . acetaminophen (TYLENOL) 500 MG tablet Take 500-2,000 mg by mouth daily.     Marland Kitchen alendronate (FOSAMAX) 70 MG tablet TAKE 1 TABLET BY MOUTH EVERY 7 (SEVEN) DAYS. TAKE WITH A FULL GLASS OF WATER ON AN EMPTY STOMACH. 12 tablet 3  . antiseptic oral rinse (BIOTENE) LIQD 15 mLs by Mouth Rinse route daily.     . Artificial Saliva (ACT DRY MOUTH) LOZG Use as directed 1 drop in the mouth or throat as needed (dry mouth).    . bimatoprost (LUMIGAN) 0.01 % SOLN Place 1 drop into both eyes at bedtime.     . Calcium Carb-Cholecalciferol (CALTRATE 600+D3) 600-800 MG-UNIT TABS 1 tablet twice daily 60 tablet 0  . Cholecalciferol (VITAMIN D) 2000 UNITS tablet Take 2,000 Units by mouth daily.    . dorzolamide (TRUSOPT) 2 % ophthalmic solution Place 1 drop into both eyes 3 (three) times daily.      Marland Kitchen ELIQUIS 5 MG TABS tablet TAKE 1 TABLET BY MOUTH TWICE A DAY 180 tablet 3  . esomeprazole (NEXIUM) 20 MG capsule Take 1 capsule (20 mg total) by mouth as needed. 30 capsule 1  . ezetimibe (ZETIA) 10 MG tablet TAKE 1 TABLET ONCE DAILY. 90 tablet 1  . fluticasone (FLONASE) 50 MCG/ACT nasal spray INHALE 1 SPRAY INTO BOTH NOSTRILS DAILY 16 mL 6  . methylcellulose (ARTIFICIAL TEARS) 1 % ophthalmic solution Place 1 drop into both eyes daily as needed (for dry eyes).     . metoprolol tartrate (LOPRESSOR) 50 MG tablet TAKE 1 TABLET BY MOUTH TWICE A DAY 180 tablet 3  . propafenone (RYTHMOL SR) 225 MG 12 hr capsule TAKE 1 CAPSULE BY MOUTH 2 TIMES DAILY. 60 capsule 5  . tizanidine (ZANAFLEX) 2 MG capsule TAKE 1 CAPSULE (2 MG TOTAL) BY MOUTH AT BEDTIME. 15 capsule 0  . traZODone (DESYREL) 50 MG tablet TAKE 1 TABLET BY MOUTH EVERYDAY AT BEDTIME 30 tablet 3   No current facility-administered medications for this visit.     Past Medical History:  Diagnosis Date  .  Allergic rhinitis due to pollen   . Anxiety state, unspecified    Sitautional- pain  . Arthritis    "right knee" (06/21/2016)  . Asymptomatic varicose veins   . Carpal tunnel syndrome   . Chest pain, unspecified   . Chronic lower back pain    "on the left side" (06/21/2016)  . Constipation   . Cramp of limb   . Depressive disorder, not elsewhere classified   . Diaphragmatic hernia without mention of obstruction or gangrene   . Diaphragmatic hernia without mention of obstruction or gangrene   . Disturbance of skin sensation   . Diverticulosis of colon (without mention of hemorrhage)   . Dysrhythmia    Atrial Flutter  . Enthesopathy of hip region   . Esophageal reflux    , just occasional  . History of blood transfusion    "w/both knee replacements"  . History of duodenal ulcer   . History of kidney stones    passed  . Insomnia, unspecified   . Lumbago   . Migraine    "none since ~ 1990" (06/21/2016)  . Myalgia and myositis,  unspecified   . Obesity, unspecified   . Other abnormal blood chemistry   . Other dysphagia   . Other malaise and fatigue   . Other nonspecific abnormal serum enzyme levels   . Other specified cardiac dysrhythmias(427.89)   . Pacemaker   . Pain in joint, ankle and foot   . Pain in joint, hand   . Pain in joint, lower leg   . Pain in joint, pelvic region and thigh   . Pain in limb   . Plantar fascial fibromatosis   . Presence of permanent cardiac pacemaker    -St Jude  . Reflux esophagitis   . Sciatica   . Spinal stenosis, unspecified region other than cervical   . Stricture and stenosis of esophagus   . Symptomatic menopausal or female climacteric states   . Unspecified essential hypertension   . Unspecified essential hypertension   . Unspecified vitamin D deficiency     ROS:   All systems reviewed and negative except as noted in the HPI.   Past Surgical History:  Procedure Laterality Date  . BREAST BIOPSY Left 1990s X 2  . CARDIOVERSION N/A 04/26/2016   Procedure: CARDIOVERSION;  Surgeon: Pixie Casino, MD;  Location: [facility];  Service: Cardiovascular;  Laterality: N/A;  . CATARACT EXTRACTION W/ INTRAOCULAR LENS IMPLANT Left 08/03/1999   DR EPES   . CATARACT EXTRACTION W/ INTRAOCULAR LENS IMPLANT Right 2000   DR EPES  . Ong  . COLONOSCOPY  1988   Normal   . DENTAL SURGERY Left 08/2016  . EP IMPLANTABLE DEVICE N/A 06/21/2016   Procedure: Pacemaker Implant;  Surgeon: Evans Lance, MD;  Location: [facility];  Service: Cardiovascular;  Laterality: N/A;  . ESOPHAGOGASTRODUODENOSCOPY (EGD) WITH ESOPHAGEAL DILATION  ~ 1982   Dr. Sharlett Iles  . EXCISION OF ACTINIC KERATOSIS     DR LUPTON   . EYE SURGERY    . INSERT / REPLACE / REMOVE PACEMAKER    . JOINT REPLACEMENT    . KNEE ARTHROSCOPY Left 2003  . KNEE ARTHROSCOPY Right 06-26-13  . KNEE CLOSED REDUCTION Right 10/23/2013   Procedure: CLOSED MANIPULATION RIGHT KNEE;   Surgeon: Mcarthur Rossetti, MD;  Location: [facility];  Service: Orthopedics;  Laterality: Right;  . LASER FOR CLOUDY CAP LEFT EYE Left 03/2006  DR DIGBY  . LUMBAR LAMINECTOMY/DECOMPRESSION MICRODISCECTOMY N/A 06/12/2019   Procedure: Laminectomy and Foraminotomy - Lumbar four-Lumbar five;  Surgeon: Eustace Moore, MD;  Location: [facility];  Service: Neurosurgery;  Laterality: N/A;  . [facility], Dr.Gray. Removed area on upper chest  . TOTAL KNEE ARTHROPLASTY Left 04/2004   DR RENDALL  . TOTAL KNEE ARTHROPLASTY Right 07/04/2013   Procedure: RIGHT TOTAL KNEE ARTHROPLASTY;  Surgeon: Mcarthur Rossetti, MD;  [facility];  Service: Orthopedics;  Laterality: Right;  . TRIGGER FINGER RELEASE Right 09/2018  . VAGINAL HYSTERECTOMY  1979     Family History  Problem Relation Age of Onset  . Ovarian cancer Mother   . Uterine cancer Mother   . Cerebrovascular Accident Mother   . Heart disease Father   . Hypertension Brother   . Obesity Daughter      Social History   Socioeconomic History  . Marital status: Married    Spouse name: Not on file  . Number of children: Not on file  . Years of education: Not on file  . Highest education level: Not on file  Occupational History  . Not on file  Tobacco Use  . Smoking status: Never Smoker  . Smokeless tobacco: Never Used  Vaping Use  . Vaping Use: Never used  Substance and Sexual Activity  . Alcohol use: No  . Drug use: No  . Sexual activity: Yes  Other Topics Concern  . Not on file  Social History Narrative  . Not on file   Social Determinants of Health   Financial Resource Strain:   . Difficulty of Paying Living Expenses: Not on file  Food Insecurity:   . Worried About Charity fundraiser in the Last Year: Not on file  . Ran Out of Food in the Last Year: Not on file  Transportation Needs:   . Lack of Transportation (Medical): Not on file  . Lack of Transportation (Non-Medical): Not on file    Physical Activity:   . Days of Exercise per Week: Not on file  . Minutes of Exercise per Session: Not on file  Stress:   . Feeling of Stress : Not on file  Social Connections:   . Frequency of Communication with Friends and Family: Not on file  . Frequency of Social Gatherings with Friends and Family: Not on file  . Attends Religious Services: Not on file  . Active Member of Clubs or Organizations: Not on file  . Attends Archivist Meetings: Not on file  . Marital Status: Not on file  Intimate Partner Violence:   . Fear of Current or Ex-Partner: Not on file  . Emotionally Abused: Not on file  . Physically Abused: Not on file  . Sexually Abused: Not on file     BP 134/86   Pulse 62   Ht 5\' 3"  (1.6 m)   Wt 185 lb (83.9 kg)   SpO2 97%   BMI 32.77 kg/m   Physical Exam:  Well appearing NAD HEENT: Unremarkable Neck:  No JVD, no thyromegally Lymphatics:  No adenopathy Back:  No CVA tenderness Lungs:  Clear with no wheezes HEART:  Regular rate rhythm, no murmurs, no rubs, no clicks Abd:  soft, positive bowel sounds, no organomegally, no rebound, no guarding Ext:  2 plus pulses, no edema, no cyanosis, no clubbing Skin:  No rashes no nodules Neuro:  CN II through XII intact, motor grossly intact  EKG -  nsr with atrial pacing  DEVICE  Normal device function.  See PaceArt for details.   Assess/Plan: 1. PAF - she is maintaining NSR very nicely on low dose propafenone. She will continue. 2. HTN - her bp is ok. I suspect that her fatigue is related to her metoprolol and have asked her to reduce the dose to 25 bid. 3. Sinus node dysfunction - she is pacing in the atrium over 99% of the time. 4. PPM - her St. Jude DDD PM is working normally. Her rates are flat and we have turned rate response on today.  Veronica Overlie Kallen Delatorre,MD

## 2020-11-16 NOTE — Patient Instructions (Addendum)
Medication Instructions:  Your physician has recommended you make the following change in your medication:   1.  DECREASE your metoprolol tartarte- Take 25 mg by mouth twice a day   Labwork: None ordered.  Testing/Procedures: None ordered.  Follow-Up: Your physician wants you to follow-up in: one year with Dr. Lovena Le.   You will receive a reminder letter in the mail two months in advance. If you don't receive a letter, please call our office to schedule the follow-up appointment.  Remote monitoring is used to monitor your Pacemaker from home. This monitoring reduces the number of office visits required to check your device to one time per year. It allows [place] to keep an eye on the functioning of your device to ensure it is working properly. You are scheduled for a device check from home on 12/21/2020. You may send your transmission at any time that day. If you have a wireless device, the transmission will be sent automatically. After your physician reviews your transmission, you will receive a postcard with your next transmission date.  Any Other Special Instructions Will Be Listed Below (If Applicable).  If you need a refill on your cardiac medications before your next appointment, please call your pharmacy.

## 2020-11-17 ENCOUNTER — Other Ambulatory Visit: Payer: Self-pay | Admitting: Nurse Practitioner

## 2020-11-17 DIAGNOSIS — M8580 Other specified disorders of bone density and structure, unspecified site: Secondary | ICD-10-CM

## 2020-11-22 NOTE — Telephone Encounter (Signed)
Patient is requesting refill on medication "Fosamax". Patient last refill on medication was 01/20/2020 with 12 tablets to be taken once a week with 3 additional refills. Patient is due for refills. Medication has allergy contraindications. Medication pend and sent to provider Lauree Chandler, NP .

## 2020-12-21 ENCOUNTER — Ambulatory Visit (INDEPENDENT_AMBULATORY_CARE_PROVIDER_SITE_OTHER): Payer: Medicare Other

## 2020-12-21 DIAGNOSIS — R001 Bradycardia, unspecified: Secondary | ICD-10-CM | POA: Diagnosis not present

## 2020-12-21 LAB — CUP PACEART REMOTE DEVICE CHECK
Battery Remaining Longevity: 118 mo
Battery Remaining Percentage: 95.5 %
Battery Voltage: 2.99 V
Brady Statistic AP VP Percent: 21 %
Brady Statistic AP VS Percent: 78 %
Brady Statistic AS VP Percent: 1 %
Brady Statistic AS VS Percent: 1 %
Brady Statistic RA Percent Paced: 99 %
Brady Statistic RV Percent Paced: 22 %
Date Time Interrogation Session: 20211228020024
Implantable Lead Implant Date: 20170628
Implantable Lead Implant Date: 20170628
Implantable Lead Location: 753859
Implantable Lead Location: 753860
Implantable Pulse Generator Implant Date: 20170628
Lead Channel Impedance Value: 530 Ohm
Lead Channel Impedance Value: 590 Ohm
Lead Channel Pacing Threshold Amplitude: 0.75 V
Lead Channel Pacing Threshold Amplitude: 1.25 V
Lead Channel Pacing Threshold Pulse Width: 0.4 ms
Lead Channel Pacing Threshold Pulse Width: 0.4 ms
Lead Channel Sensing Intrinsic Amplitude: 1.2 mV
Lead Channel Sensing Intrinsic Amplitude: 9.7 mV
Lead Channel Setting Pacing Amplitude: 2 V
Lead Channel Setting Pacing Amplitude: 2.5 V
Lead Channel Setting Pacing Pulse Width: 0.4 ms
Lead Channel Setting Sensing Sensitivity: 2 mV
Pulse Gen Model: 2272
Pulse Gen Serial Number: 7910313

## 2020-12-28 ENCOUNTER — Encounter: Payer: Self-pay | Admitting: Orthopedic Surgery

## 2020-12-28 ENCOUNTER — Emergency Department (HOSPITAL_COMMUNITY)
Admission: EM | Admit: 2020-12-28 | Discharge: 2020-12-28 | Disposition: A | Discharge status: Home / Self Care | Payer: Medicare Other | Attending: Emergency Medicine | Admitting: Emergency Medicine

## 2020-12-28 ENCOUNTER — Encounter (HOSPITAL_COMMUNITY): Payer: Self-pay | Admitting: Emergency Medicine

## 2020-12-28 ENCOUNTER — Ambulatory Visit (INDEPENDENT_AMBULATORY_CARE_PROVIDER_SITE_OTHER): Payer: Medicare Other | Admitting: Orthopedic Surgery

## 2020-12-28 ENCOUNTER — Emergency Department (HOSPITAL_COMMUNITY): Payer: Medicare Other

## 2020-12-28 ENCOUNTER — Ambulatory Visit: Payer: Self-pay | Admitting: Nurse Practitioner

## 2020-12-28 ENCOUNTER — Other Ambulatory Visit: Payer: Self-pay

## 2020-12-28 VITALS — BP 132/78 | HR 71 | Temp 97.1°F | Wt 186.6 lb

## 2020-12-28 DIAGNOSIS — Z950 Presence of cardiac pacemaker: Secondary | ICD-10-CM | POA: Diagnosis not present

## 2020-12-28 DIAGNOSIS — S0083XA Contusion of other part of head, initial encounter: Secondary | ICD-10-CM | POA: Diagnosis not present

## 2020-12-28 DIAGNOSIS — W19XXXA Unspecified fall, initial encounter: Secondary | ICD-10-CM | POA: Insufficient documentation

## 2020-12-28 DIAGNOSIS — S0990XA Unspecified injury of head, initial encounter: Secondary | ICD-10-CM | POA: Diagnosis not present

## 2020-12-28 DIAGNOSIS — Z5321 Procedure and treatment not carried out due to patient leaving prior to being seen by health care provider: Secondary | ICD-10-CM | POA: Insufficient documentation

## 2020-12-28 NOTE — ED Triage Notes (Signed)
Per pt, states she was putting up christmas decorations and fell backwards hitting back of head-states she has a pacemaker and is on blood thinners

## 2020-12-28 NOTE — Patient Instructions (Signed)
  What should I watch for while using this medicine? Visit your healthcare professional for regular checks on your progress. You may need blood work done while you are taking this medicine. Your condition will be monitored carefully while you are receiving this medicine. It is important not to miss any appointments. Avoid sports and activities that might cause injury while you are using this medicine. Severe falls or injuries can cause unseen bleeding. Be careful when using sharp tools or knives. Consider using an Neurosurgeon. Take special care brushing or flossing your teeth. Report any injuries, bruising, or red spots on the skin to your healthcare professional. If you are going to need surgery or other procedure, tell your healthcare professional that you are taking this medicine. Wear a medical ID bracelet or chain. Carry a card that describes your disease and details of your medicine and dosage times.  2020 Elsevier/Gold Standard (2018-08-21 17:39:34)

## 2020-12-28 NOTE — Progress Notes (Signed)
Location:   Therapist, nutritional of Service:   [place] Provider:  Windell Moulding, AGNP-C  Dewaine Oats, Carlos American, NP  Patient Care Team: Lauree Chandler, NP as PCP - General (Geriatric Medicine) Martinique, Peter M, MD as PCP - Cardiology (Cardiology) Mcarthur Rossetti, MD as Attending Physician (Orthopedic Surgery) Larey Dresser, MD as Attending Physician (Cardiology) Thornell Sartorius, MD as Consulting Physician (Otolaryngology) Druscilla Brownie, MD as Consulting Physician (Dermatology) Calvert Cantor, MD as Consulting Physician (Ophthalmology) Debara Pickett Nadean Corwin, MD as Consulting Physician (Cardiology) Eustace Moore, MD as Consulting Physician (Neurosurgery)  Extended Emergency Contact Information Primary Emergency Contact: Protzman,Owen Address: [address]          [place], [place] [address] [name] of [place] Phone: [phone] Mobile Phone: [phone] Relation: Spouse Secondary Emergency Contact: Jenera of [place] Mobile Phone: [phone] Relation: Friend  Code Status:  DNR Goals of care: Advanced Directive information Advanced Directives 12/28/2020  Does Patient Have a Medical Advance Directive? Yes  Type of Paramedic of [place];Out of facility DNR (pink MOST or yellow form)  Does patient want to make changes to medical advance directive? No - Patient declined  Copy of [place] in Chart? Yes - validated most recent copy scanned in chart (See row information)  Pre-existing out of facility DNR order (yellow form or pink MOST form) Yellow form placed in chart (order not valid for inpatient use);Pink MOST form placed in chart (order not valid for inpatient use)     Chief Complaint  Patient presents with  . Acute Visit    Fall today about 1 hour and 15 min ago, patient with a raised area in the back of head and bad headache. Patient was putting decorations away and fell  backwards, immediately afterwards patient felt dizzy (now resolved). Patient is concerned because she takes a blood thinner.     HPI:  Pt is a 87 y.o. female seen today for an acute visit for bump on her head due to fall. PMH includes: hypertension, atrial fibrillation, sinus node dysfunction, GERD, osteoarthritis, hypercholesterolemia, and anxiety.   Husband present during encounter.   She was putting away christmas decorations and slipped and fell backwards. The back of her head hit the door post. In the past few hours, she claims the bump on her posterior head has increased in size. Denies pain. Reports some slight tenderness to area. Currently takes 5 mg Eliquis po bid. She denies headache, dizziness, nausea, vomiting, confusion or blurred vision.   Due to the size of her injury, I have advised her and her husband to report to the ED for further evaluation. They agreed with my recommendation and state they will be heading to [facility] after this encounter.    Past Medical History:  Diagnosis Date  . Allergic rhinitis due to pollen   . Anxiety state, unspecified    Sitautional- pain  . Arthritis    "right knee" (06/21/2016)  . Asymptomatic varicose veins   . Carpal tunnel syndrome   . Chest pain, unspecified   . Chronic lower back pain    "on the left side" (06/21/2016)  . Constipation   . Cramp of limb   . Depressive disorder, not elsewhere classified   . Diaphragmatic hernia without mention of obstruction or gangrene   . Diaphragmatic hernia without mention of obstruction or gangrene   . Disturbance of skin sensation   . Diverticulosis of  colon (without mention of hemorrhage)   . Dysrhythmia    Atrial Flutter  . Enthesopathy of hip region   . Esophageal reflux    , just occasional  . History of blood transfusion    "w/both knee replacements"  . History of duodenal ulcer   . History of kidney stones    passed  . Insomnia, unspecified   . Lumbago   . Migraine     "none since ~ 1990" (06/21/2016)  . Myalgia and myositis, unspecified   . Obesity, unspecified   . Other abnormal blood chemistry   . Other dysphagia   . Other malaise and fatigue   . Other nonspecific abnormal serum enzyme levels   . Other specified cardiac dysrhythmias(427.89)   . Pacemaker   . Pain in joint, ankle and foot   . Pain in joint, hand   . Pain in joint, lower leg   . Pain in joint, pelvic region and thigh   . Pain in limb   . Plantar fascial fibromatosis   . Presence of permanent cardiac pacemaker    -St Jude  . Reflux esophagitis   . Sciatica   . Spinal stenosis, unspecified region other than cervical   . Stricture and stenosis of esophagus   . Symptomatic menopausal or female climacteric states   . Unspecified essential hypertension   . Unspecified essential hypertension   . Unspecified vitamin D deficiency    Past Surgical History:  Procedure Laterality Date  . BREAST BIOPSY Left 1990s X 2  . CARDIOVERSION N/A 04/26/2016   Procedure: CARDIOVERSION;  Surgeon: Pixie Casino, MD;  Location: [facility];  Service: Cardiovascular;  Laterality: N/A;  . CATARACT EXTRACTION W/ INTRAOCULAR LENS IMPLANT Left 08/03/1999   DR EPES   . CATARACT EXTRACTION W/ INTRAOCULAR LENS IMPLANT Right 2000   DR EPES  . [place]  . COLONOSCOPY  1988   Normal   . DENTAL SURGERY Left 08/2016  . EP IMPLANTABLE DEVICE N/A 06/21/2016   Procedure: Pacemaker Implant;  Surgeon: Evans Lance, MD;  Location: [facility];  Service: Cardiovascular;  Laterality: N/A;  . ESOPHAGOGASTRODUODENOSCOPY (EGD) WITH ESOPHAGEAL DILATION  ~ 1982   Dr. Sharlett Iles  . EXCISION OF ACTINIC KERATOSIS     DR LUPTON   . EYE SURGERY    . INSERT / REPLACE / REMOVE PACEMAKER    . JOINT REPLACEMENT    . KNEE ARTHROSCOPY Left 2003  . KNEE ARTHROSCOPY Right 06-26-13  . KNEE CLOSED REDUCTION Right 10/23/2013   Procedure: CLOSED MANIPULATION RIGHT KNEE;  Surgeon: Mcarthur Rossetti, MD;  Location: [facility];  Service: Orthopedics;  Laterality: Right;  . LASER FOR CLOUDY CAP LEFT EYE Left 03/2006   DR DIGBY  . LUMBAR LAMINECTOMY/DECOMPRESSION MICRODISCECTOMY N/A 06/12/2019   Procedure: Laminectomy and Foraminotomy - Lumbar four-Lumbar five;  Surgeon: Eustace Moore, MD;  Location: [facility];  Service: Neurosurgery;  Laterality: N/A;  . [facility], Dr.Gray. Removed area on upper chest  . TOTAL KNEE ARTHROPLASTY Left 04/2004   DR RENDALL  . TOTAL KNEE ARTHROPLASTY Right 07/04/2013   Procedure: RIGHT TOTAL KNEE ARTHROPLASTY;  Surgeon: Mcarthur Rossetti, MD;  Location: [facility];  Service: Orthopedics;  Laterality: Right;  . TRIGGER FINGER RELEASE Right 09/2018  . VAGINAL HYSTERECTOMY  1979    Allergies  Allergen Reactions  . Advil [Ibuprofen]     GI upset  . Aleve [  Naproxen Sodium]     GI upset  . Aspirin Nausea And Vomiting    Takes baby aspirin qod without problems  . Atorvastatin     Leg cramps  . Celebrex [Celecoxib]     GI upset  . Diclofenac     GI upset  . Doxycycline     Headache  . Fosamax [Alendronate Sodium]     GI discomfort   . Metoclopramide Hcl     REACTION: heart palps  . Nsaids Other (See Comments)    Tears my stomach up   . Other     Antihistamine - increase BP  . Pravastatin     Leg cramps  . Timolol     Dropped HR    Outpatient Encounter Medications as of 12/28/2020  Medication Sig  . acetaminophen (TYLENOL) 500 MG tablet Take 500-2,000 mg by mouth daily.  Marland Kitchen alendronate (FOSAMAX) 70 MG tablet TAKE 1 TABLET BY MOUTH EVERY 7 (SEVEN) DAYS. TAKE WITH A FULL GLASS OF WATER ON AN EMPTY STOMACH.  Marland Kitchen antiseptic oral rinse (BIOTENE) LIQD 15 mLs by Mouth Rinse route daily.  . Artificial Saliva (ACT DRY MOUTH) LOZG Use as directed 1 drop in the mouth or throat as needed (dry mouth).  . bimatoprost (LUMIGAN) 0.01 % SOLN Place 1 drop into both eyes at bedtime.   . Calcium Carb-Cholecalciferol (CALTRATE 600+D3)  600-800 MG-UNIT TABS 1 tablet twice daily  . Cholecalciferol (VITAMIN D) 2000 UNITS tablet Take 2,000 Units by mouth daily.  . dorzolamide (TRUSOPT) 2 % ophthalmic solution Place 1 drop into both eyes 3 (three) times daily.  Marland Kitchen ELIQUIS 5 MG TABS tablet TAKE 1 TABLET BY MOUTH TWICE A DAY  . esomeprazole (NEXIUM) 20 MG capsule Take 1 capsule (20 mg total) by mouth as needed.  . ezetimibe (ZETIA) 10 MG tablet TAKE 1 TABLET ONCE DAILY.  . fluticasone (FLONASE) 50 MCG/ACT nasal spray INHALE 1 SPRAY INTO BOTH NOSTRILS DAILY  . methylcellulose (ARTIFICIAL TEARS) 1 % ophthalmic solution Place 1 drop into both eyes daily as needed (for dry eyes).   . metoprolol tartrate (LOPRESSOR) 25 MG tablet Take 1 tablet (25 mg total) by mouth 2 (two) times daily.  . propafenone (RYTHMOL SR) 225 MG 12 hr capsule TAKE 1 CAPSULE BY MOUTH 2 TIMES DAILY.  . tizanidine (ZANAFLEX) 2 MG capsule TAKE 1 CAPSULE (2 MG TOTAL) BY MOUTH AT BEDTIME.  . traZODone (DESYREL) 50 MG tablet TAKE 1 TABLET BY MOUTH EVERYDAY AT BEDTIME  . valsartan-hydrochlorothiazide (DIOVAN-HCT) 320-25 MG tablet TAKE 1 TABLET ONCE DAILY.   No facility-administered encounter medications on file as of 12/28/2020.    Review of Systems  Constitutional: Negative for activity change and fatigue.  Respiratory: Negative for cough, shortness of breath and wheezing.   Cardiovascular: Negative for chest pain.  Gastrointestinal: Negative for nausea and vomiting.  Skin:       Bruise to posterior head  Neurological: Negative for dizziness and headaches.  Psychiatric/Behavioral: Negative for confusion and dysphoric mood. The patient is not nervous/anxious.     Immunization History  Administered Date(s) Administered  . Fluad Quad(high Dose 65+) 09/29/2019, 10/01/2020  . Influenza, High Dose Seasonal PF 09/14/2017, 10/02/2018  . Influenza,inj,Quad PF,6+ Mos 09/23/2013, 09/25/2014, 10/11/2015, 08/30/2016  . PFIZER SARS-COV-2 Vaccination 01/16/2020, 02/06/2020,  09/29/2020  . Pneumococcal Conjugate-13 02/17/2015  . Pneumococcal Polysaccharide-23 12/12/2016  . Tdap 06/24/2017  . Zoster 01/29/2008  . Zoster Recombinat (Shingrix) 09/04/2017, 02/21/2018   Pertinent  Health Maintenance Due  Topic Date Due  .  MAMMOGRAM  10/05/2021  . INFLUENZA VACCINE  Completed  . DEXA SCAN  Completed  . PNA vac Low Risk Adult  Completed   Fall Risk  12/28/2020 10/01/2020 04/21/2020 04/02/2020 01/12/2020  Falls in the past year? 1 0 0 0 0  Number falls in past yr: 0 0 0 0 0  Comment - - - - -  Injury with Fall? 1 0 0 0 0  Comment Injured back of head - - - -  Risk Factor Category  - - - - -  Risk for fall due to : History of fall(s) - - - -  Follow up - - - - -  Comment - - - - -   Functional Status Survey:    Vitals:   12/28/20 1513  BP: 132/78  Pulse: 71  Temp: (!) 97.1 F (36.2 C)  TempSrc: Temporal  SpO2: 98%  Weight: 186 lb 9.6 oz (84.6 kg)   Body mass index is 33.05 kg/m. Physical Exam Vitals reviewed.  Constitutional:      General: She is not in acute distress.    Appearance: Normal appearance.  HENT:     Head:   Eyes:     Extraocular Movements: Extraocular movements intact.     Right eye: Normal extraocular motion and no nystagmus.     Left eye: Normal extraocular motion and no nystagmus.     Pupils: Pupils are equal, round, and reactive to light.  Cardiovascular:     Rate and Rhythm: Normal rate and regular rhythm.     Pulses: Normal pulses.     Heart sounds: Normal heart sounds.  Pulmonary:     Effort: Pulmonary effort is normal. No respiratory distress.     Breath sounds: Normal breath sounds. No wheezing.  Neurological:     Mental Status: She is alert.     Labs reviewed: Recent Labs    03/30/20 0931 09/17/20 0811  NA 139 140  K 3.6 4.0  CL 103 101  CO2 31 26  GLUCOSE 98 92  BUN 17 18  CREATININE 0.74 0.81  CALCIUM 9.2 9.5   Recent Labs    03/30/20 0931  AST 18  ALT 18  BILITOT 0.5  PROT 6.5   Recent Labs     03/30/20 0931 09/17/20 0811  WBC 4.5 5.0  NEUTROABS 2,102 2.2  HGB 12.8 13.1  HCT 39.1 38.5  MCV 92.7 93  PLT 179 180   Lab Results  Component Value Date   TSH 7.060 (H) 09/21/2020   Lab Results  Component Value Date   HGBA1C 5.2 08/06/2017   Lab Results  Component Value Date   CHOL 153 03/30/2020   HDL 39 (L) 03/30/2020   LDLCALC 86 03/30/2020   TRIG 183 (H) 03/30/2020   CHOLHDL 3.9 03/30/2020    Significant Diagnostic Results in last 30 days:  CUP PACEART REMOTE DEVICE CHECK  Result Date: 12/21/2020 Scheduled remote reviewed.  Normal device function.  Next remote 91 days- JBox, RN/CVRS   Assessment/Plan 1. Hematoma of occipital surface of head, initial encounter - hematoma about 5 cm, she is asymptomatic at this time, PERRLA - currently taking Eliquis 5 mg po bid for atrial fibrillation - I have advised the patient to report to the ED for further medical evaluation due to the size of injury and being on a blood thinner, educated patient about concerns of small bleed     Family/ staff Communication: Plan discussed with patient and husband  Labs/tests ordered:  Report to ED for further evaluation

## 2021-01-03 ENCOUNTER — Telehealth: Payer: Self-pay | Admitting: Nurse Practitioner

## 2021-01-03 NOTE — Telephone Encounter (Signed)
Prolia Request Process Authorization filled out and placed on Lisa's desk for [employer].

## 2021-01-03 NOTE — Telephone Encounter (Signed)
[name] took this msg but I would like for it to go through [facility]

## 2021-01-03 NOTE — Progress Notes (Signed)
Remote pacemaker transmission.   

## 2021-01-03 NOTE — Telephone Encounter (Signed)
Does Angle need her labs done in January or closer to her appointment in April.  And she also wants to stop taking alencronate sodim 70Mg  once a week and it is beginning to bother her stomach. Her insurance said they would cover prolia injections (tier 3 Drug for pt)  and she was wondering if she could start getting those. Her insurance said her co-pay would be around $45 so she is interested in getting Prolia if she can.   She would like a call back today if possible at [phone].  Letting her know if we need to reschedule her lab appt and if she can begin getting Prolia.

## 2021-01-03 NOTE — Telephone Encounter (Signed)
Patient notified and agreed and aware of the process.

## 2021-01-03 NOTE — Telephone Encounter (Signed)
No lets go ahead with getting her the prolia scheduled in office, she will stop Alendronate when she starts prolia

## 2021-01-03 NOTE — Telephone Encounter (Signed)
Called patient and confirmed labs with her for 1/20.   Patient stated that she can talk with you at her appointment in April but she would like to start taking Prolia due to Alendronate hurting her stomach. She stated that she called her insurance and they will cover the Prolia at a Tier 3 and copay of $45. Wants to know if she should go ahead with the Prolia or wait till April to discuss at her appointment.  Please Advise.

## 2021-01-13 ENCOUNTER — Other Ambulatory Visit: Payer: Medicare Other

## 2021-01-14 ENCOUNTER — Encounter: Payer: Medicare Other | Admitting: Nurse Practitioner

## 2021-01-17 ENCOUNTER — Other Ambulatory Visit: Payer: Self-pay

## 2021-01-17 ENCOUNTER — Other Ambulatory Visit: Payer: Medicare Other

## 2021-01-17 DIAGNOSIS — R7989 Other specified abnormal findings of blood chemistry: Secondary | ICD-10-CM

## 2021-01-18 LAB — T4, FREE: Free T4: 0.9 ng/dL (ref 0.8–1.8)

## 2021-01-18 LAB — T3, FREE: T3, Free: 2.9 pg/mL (ref 2.3–4.2)

## 2021-01-18 LAB — TSH: TSH: 3.1 mIU/L (ref 0.40–4.50)

## 2021-01-21 ENCOUNTER — Other Ambulatory Visit: Payer: Medicare Other

## 2021-01-24 ENCOUNTER — Encounter: Payer: Self-pay | Admitting: Nurse Practitioner

## 2021-01-24 ENCOUNTER — Other Ambulatory Visit: Payer: Self-pay

## 2021-01-24 ENCOUNTER — Telehealth: Payer: Self-pay

## 2021-01-24 ENCOUNTER — Ambulatory Visit (INDEPENDENT_AMBULATORY_CARE_PROVIDER_SITE_OTHER): Payer: Medicare Other | Admitting: Nurse Practitioner

## 2021-01-24 DIAGNOSIS — Z0000 Encounter for general adult medical examination without abnormal findings: Secondary | ICD-10-CM | POA: Diagnosis not present

## 2021-01-24 DIAGNOSIS — Z66 Do not resuscitate: Secondary | ICD-10-CM | POA: Diagnosis not present

## 2021-01-24 DIAGNOSIS — E2839 Other primary ovarian failure: Secondary | ICD-10-CM

## 2021-01-24 NOTE — Patient Instructions (Signed)
[name] , Thank you for taking time to come for your Medicare Wellness Visit. I appreciate your ongoing commitment to your health goals. Please review the following plan we discussed and let me know if I can assist you in the future.   Screening recommendations/referrals: Colonoscopy aged out Mammogram aged out Bone Density to call [phone] to schedule  Recommended yearly ophthalmology/optometry visit for glaucoma screening and checkup Recommended yearly dental visit for hygiene and checkup  Vaccinations: Influenza vaccine up to date Pneumococcal vaccine up to date Tdap vaccine up to date Shingles vaccine up to date    Advanced directives: on file.   Conditions/risks identified: advanced age, fall risk, cardiovascular risk  Next appointment: yearly    Preventive Care 34 Years and Older, Female Preventive care refers to lifestyle choices and visits with your health care provider that can promote health and wellness. What does preventive care include?  A yearly physical exam. This is also called an annual well check.  Dental exams once or twice a year.  Routine eye exams. Ask your health care provider how often you should have your eyes checked.  Personal lifestyle choices, including:  Daily care of your teeth and gums.  Regular physical activity.  Eating a healthy diet.  Avoiding tobacco and drug use.  Limiting alcohol use.  Practicing safe sex.  Taking low-dose aspirin every day.  Taking vitamin and mineral supplements as recommended by your health care provider. What happens during an annual well check? The services and screenings done by your health care provider during your annual well check will depend on your age, overall health, lifestyle risk factors, and family history of disease. Counseling  Your health care provider may ask you questions about your:  Alcohol use.  Tobacco use.  Drug use.  Emotional well-being.  Home and relationship  well-being.  Sexual activity.  Eating habits.  History of falls.  Memory and ability to understand (cognition).  Work and work Statistician.  Reproductive health. Screening  You may have the following tests or measurements:  Height, weight, and BMI.  Blood pressure.  Lipid and cholesterol levels. These may be checked every 5 years, or more frequently if you are over 98 years old.  Skin check.  Lung cancer screening. You may have this screening every year starting at age 59 if you have a 30-pack-year history of smoking and currently smoke or have quit within the past 15 years.  Fecal occult blood test (FOBT) of the stool. You may have this test every year starting at age 22.  Flexible sigmoidoscopy or colonoscopy. You may have a sigmoidoscopy every 5 years or a colonoscopy every 10 years starting at age 17.  Hepatitis C blood test.  Hepatitis B blood test.  Sexually transmitted disease (STD) testing.  Diabetes screening. This is done by checking your blood sugar (glucose) after you have not eaten for a while (fasting). You may have this done every 1-3 years.  Bone density scan. This is done to screen for osteoporosis. You may have this done starting at age 73.  Mammogram. This may be done every 1-2 years. Talk to your health care provider about how often you should have regular mammograms. Talk with your health care provider about your test results, treatment options, and if necessary, the need for more tests. Vaccines  Your health care provider may recommend certain vaccines, such as:  Influenza vaccine. This is recommended every year.  Tetanus, diphtheria, and acellular pertussis (Tdap, Td) vaccine. You may need  a Td booster every 10 years.  Zoster vaccine. You may need this after age 37.  Pneumococcal 13-valent conjugate (PCV13) vaccine. One dose is recommended after age 79.  Pneumococcal polysaccharide (PPSV23) vaccine. One dose is recommended after age  33. Talk to your health care provider about which screenings and vaccines you need and how often you need them. This information is not intended to replace advice given to you by your health care provider. Make sure you discuss any questions you have with your health care provider. Document Released: 01/07/2016 Document Revised: 08/30/2016 Document Reviewed: 10/12/2015 Elsevier Interactive Patient Education  2017 [place] Prevention in the Home Falls can cause injuries. They can happen to people of all ages. There are many things you can do to make your home safe and to help prevent falls. What can I do on the outside of my home?  Regularly fix the edges of walkways and driveways and fix any cracks.  Remove anything that might make you trip as you walk through a door, such as a raised step or threshold.  Trim any bushes or trees on the path to your home.  Use bright outdoor lighting.  Clear any walking paths of anything that might make someone trip, such as rocks or tools.  Regularly check to see if handrails are loose or broken. Make sure that both sides of any steps have handrails.  Any raised decks and porches should have guardrails on the edges.  Have any leaves, snow, or ice cleared regularly.  Use sand or salt on walking paths during winter.  Clean up any spills in your garage right away. This includes oil or grease spills. What can I do in the bathroom?  Use night lights.  Install grab bars by the toilet and in the tub and shower. Do not use towel bars as grab bars.  Use non-skid mats or decals in the tub or shower.  If you need to sit down in the shower, use a plastic, non-slip stool.  Keep the floor dry. Clean up any water that spills on the floor as soon as it happens.  Remove soap buildup in the tub or shower regularly.  Attach bath mats securely with double-sided non-slip rug tape.  Do not have throw rugs and other things on the floor that can make  you trip. What can I do in the bedroom?  Use night lights.  Make sure that you have a light by your bed that is easy to reach.  Do not use any sheets or blankets that are too big for your bed. They should not hang down onto the floor.  Have a firm chair that has side arms. You can use this for support while you get dressed.  Do not have throw rugs and other things on the floor that can make you trip. What can I do in the kitchen?  Clean up any spills right away.  Avoid walking on wet floors.  Keep items that you use a lot in easy-to-reach places.  If you need to reach something above you, use a strong step stool that has a grab bar.  Keep electrical cords out of the way.  Do not use floor polish or wax that makes floors slippery. If you must use wax, use non-skid floor wax.  Do not have throw rugs and other things on the floor that can make you trip. What can I do with my stairs?  Do not leave any items on the  stairs.  Make sure that there are handrails on both sides of the stairs and use them. Fix handrails that are broken or loose. Make sure that handrails are as long as the stairways.  Check any carpeting to make sure that it is firmly attached to the stairs. Fix any carpet that is loose or worn.  Avoid having throw rugs at the top or bottom of the stairs. If you do have throw rugs, attach them to the floor with carpet tape.  Make sure that you have a light switch at the top of the stairs and the bottom of the stairs. If you do not have them, ask someone to add them for you. What else can I do to help prevent falls?  Wear shoes that:  Do not have high heels.  Have rubber bottoms.  Are comfortable and fit you well.  Are closed at the toe. Do not wear sandals.  If you use a stepladder:  Make sure that it is fully opened. Do not climb a closed stepladder.  Make sure that both sides of the stepladder are locked into place.  Ask someone to hold it for you, if  possible.  Clearly mark and make sure that you can see:  Any grab bars or handrails.  First and last steps.  Where the edge of each step is.  Use tools that help you move around (mobility aids) if they are needed. These include:  Canes.  Walkers.  Scooters.  Crutches.  Turn on the lights when you go into a dark area. Replace any light bulbs as soon as they burn out.  Set up your furniture so you have a clear path. Avoid moving your furniture around.  If any of your floors are uneven, fix them.  If there are any pets around you, be aware of where they are.  Review your medicines with your doctor. Some medicines can make you feel dizzy. This can increase your chance of falling. Ask your doctor what other things that you can do to help prevent falls. This information is not intended to replace advice given to you by your health care provider. Make sure you discuss any questions you have with your health care provider. Document Released: 10/07/2009 Document Revised: 05/18/2016 Document Reviewed: 01/15/2015 Elsevier Interactive Patient Education  2017 [employer].

## 2021-01-24 NOTE — Telephone Encounter (Signed)
[name], forster are scheduled for a virtual visit with your provider today.    Just as we do with appointments in the office, we must obtain your consent to participate.  Your consent will be active for this visit and any virtual visit you may have with one of our providers in the next 365 days.    If you have a MyChart account, I can also send a copy of this consent to you electronically.  All virtual visits are billed to your insurance company just like a traditional visit in the office.  As this is a virtual visit, video technology does not allow for your provider to perform a traditional examination.  This may limit your provider's ability to fully assess your condition.  If your provider identifies any concerns that need to be evaluated in person or the need to arrange testing such as labs, EKG, etc, we will make arrangements to do so.    Although advances in technology are sophisticated, we cannot ensure that it will always work on either your end or our end.  If the connection with a video visit is poor, we may have to switch to a telephone visit.  With either a video or telephone visit, we are not always able to ensure that we have a secure connection.   I need to obtain your verbal consent now.   Are you willing to proceed with your visit today?   [name] has provided verbal consent on 01/24/2021 for a virtual visit (video or telephone).   [name], [place] 01/24/2021  9:44 AM

## 2021-01-24 NOTE — Progress Notes (Signed)
   This service is provided via telemedicine  No vital signs collected/recorded due to the encounter was a telemedicine visit.   Location of patient (ex: home, work):  Home  Patient consents to a telephone visit: Yes, see telephone visit dated 01/24/21   Location of the provider (ex: office, home):  [facility], Office   Name of any referring provider:  N/A  Names of all persons participating in the telemedicine service and their role in the encounter:  S.Chrae B/CMA, [name], NP, and Patient   Time spent on call:  11 min with medical assistant

## 2021-01-24 NOTE — Progress Notes (Signed)
Subjective:   [name] is a 87 y.o. female who presents for Medicare Annual (Subsequent) preventive examination.  Review of Systems     Cardiac Risk Factors include: obesity (BMI >30kg/m2);hypertension;dyslipidemia;advanced age (>83men, >72 women);family history of premature cardiovascular disease     Objective:    There were no vitals filed for this visit. There is no height or weight on file to calculate BMI.  Advanced Directives 01/24/2021 12/28/2020 10/01/2020 04/21/2020 01/12/2020 01/13/2019 01/06/2019  Does Patient Have a Medical Advance Directive? Yes Yes Yes Yes Yes Yes Yes  Type of Paramedic of [place];Out of facility DNR (pink MOST or yellow form) [employer];Out of facility DNR (pink MOST or yellow form) [place];Out of facility DNR (pink MOST or yellow form) Healthcare Power of [place]  Does patient want to make changes to medical advance directive? No - Patient declined No - Patient declined No - Patient declined No - Patient declined No - Patient declined No - Patient declined -  Copy of [place] in Chart? Yes - validated most recent copy scanned in chart (See row information) Yes - validated most recent copy scanned in chart (See row information) Yes - validated most recent copy scanned in chart (See row information) Yes - validated most recent copy scanned in chart (See row information) Yes - validated most recent copy scanned in chart (See row information) Yes - validated most recent copy scanned in chart (See row information) -  Pre-existing out of facility DNR order (yellow form or pink MOST form) - Yellow form placed in chart (order not valid for inpatient use);Pink MOST form placed in chart (order not valid for inpatient use) Yellow form placed in chart (order not valid for inpatient use);Pink MOST form  placed in chart (order not valid for inpatient use) - - - -    Current Medications (verified) Outpatient Encounter Medications as of 01/24/2021  Medication Sig  . acetaminophen (TYLENOL) 500 MG tablet Take 500-2,000 mg by mouth daily.  Marland Kitchen alendronate (FOSAMAX) 70 MG tablet TAKE 1 TABLET BY MOUTH EVERY 7 (SEVEN) DAYS. TAKE WITH A FULL GLASS OF WATER ON AN EMPTY STOMACH.  Marland Kitchen antiseptic oral rinse (BIOTENE) LIQD 15 mLs by Mouth Rinse route daily.  . Artificial Saliva (ACT DRY MOUTH) LOZG Use as directed 1 drop in the mouth or throat as needed (dry mouth).  . bimatoprost (LUMIGAN) 0.01 % SOLN Place 1 drop into both eyes at bedtime.   . Calcium Carb-Cholecalciferol (CALTRATE 600+D3) 600-800 MG-UNIT TABS 1 tablet twice daily  . Cholecalciferol (VITAMIN D) 2000 UNITS tablet Take 2,000 Units by mouth daily.  . dorzolamide (TRUSOPT) 2 % ophthalmic solution Place 1 drop into both eyes 3 (three) times daily.  Marland Kitchen ELIQUIS 5 MG TABS tablet TAKE 1 TABLET BY MOUTH TWICE A DAY  . esomeprazole (NEXIUM) 20 MG capsule Take 1 capsule (20 mg total) by mouth as needed.  . ezetimibe (ZETIA) 10 MG tablet TAKE 1 TABLET ONCE DAILY.  . fluticasone (FLONASE) 50 MCG/ACT nasal spray INHALE 1 SPRAY INTO BOTH NOSTRILS DAILY  . methylcellulose (ARTIFICIAL TEARS) 1 % ophthalmic solution Place 1 drop into both eyes daily as needed (for dry eyes).   . metoprolol tartrate (LOPRESSOR) 25 MG tablet Take 1 tablet (25 mg total) by mouth 2 (two) times daily.  . propafenone (RYTHMOL SR) 225 MG 12 hr capsule TAKE 1 CAPSULE  BY MOUTH 2 TIMES DAILY.  . tizanidine (ZANAFLEX) 2 MG capsule TAKE 1 CAPSULE (2 MG TOTAL) BY MOUTH AT BEDTIME.  . traZODone (DESYREL) 50 MG tablet TAKE 1 TABLET BY MOUTH EVERYDAY AT BEDTIME  . trolamine salicylate (ASPERCREME) 10 % cream Apply 1 application topically as needed for muscle pain (for knee pain).  . valsartan-hydrochlorothiazide (DIOVAN-HCT) 320-25 MG tablet TAKE 1 TABLET ONCE DAILY.   No  facility-administered encounter medications on file as of 01/24/2021.    Allergies (verified) Advil [ibuprofen], Aleve [naproxen sodium], Aspirin, Atorvastatin, Celebrex [celecoxib], Diclofenac, Doxycycline, Fosamax [alendronate sodium], Metoclopramide hcl, Nsaids, Other, Pravastatin, and Timolol   History: Past Medical History:  Diagnosis Date  . Allergic rhinitis due to pollen   . Anxiety state, unspecified    Sitautional- pain  . Arthritis    "right knee" (06/21/2016)  . Asymptomatic varicose veins   . Carpal tunnel syndrome   . Chest pain, unspecified   . Chronic lower back pain    "on the left side" (06/21/2016)  . Constipation   . Cramp of limb   . Depressive disorder, not elsewhere classified   . Diaphragmatic hernia without mention of obstruction or gangrene   . Diaphragmatic hernia without mention of obstruction or gangrene   . Disturbance of skin sensation   . Diverticulosis of colon (without mention of hemorrhage)   . Dysrhythmia    Atrial Flutter  . Enthesopathy of hip region   . Esophageal reflux    , just occasional  . History of blood transfusion    "w/both knee replacements"  . History of duodenal ulcer   . History of kidney stones    passed  . Insomnia, unspecified   . Lumbago   . Migraine    "none since ~ 1990" (06/21/2016)  . Myalgia and myositis, unspecified   . Obesity, unspecified   . Other abnormal blood chemistry   . Other dysphagia   . Other malaise and fatigue   . Other nonspecific abnormal serum enzyme levels   . Other specified cardiac dysrhythmias(427.89)   . Pacemaker   . Pain in joint, ankle and foot   . Pain in joint, hand   . Pain in joint, lower leg   . Pain in joint, pelvic region and thigh   . Pain in limb   . Plantar fascial fibromatosis   . Presence of permanent cardiac pacemaker    -St Jude  . Reflux esophagitis   . Sciatica   . Spinal stenosis, unspecified region other than cervical   . Stricture and stenosis of esophagus    . Symptomatic menopausal or female climacteric states   . Unspecified essential hypertension   . Unspecified essential hypertension   . Unspecified vitamin D deficiency    Past Surgical History:  Procedure Laterality Date  . BREAST BIOPSY Left 1990s X 2  . CARDIOVERSION N/A 04/26/2016   Procedure: CARDIOVERSION;  Surgeon: Pixie Casino, MD;  Location: [place];  Service: Cardiovascular;  Laterality: N/A;  . CATARACT EXTRACTION W/ INTRAOCULAR LENS IMPLANT Left 08/03/1999   DR EPES   . CATARACT EXTRACTION W/ INTRAOCULAR LENS IMPLANT Right 2000   DR EPES  . [place]  . COLONOSCOPY  1988   Normal   . DENTAL SURGERY Left 08/2016  . EP IMPLANTABLE DEVICE N/A 06/21/2016   Procedure: Pacemaker Implant;  Surgeon: Evans Lance, MD;  Location: [place] [facility];  Service: Cardiovascular;  Laterality: N/A;  . ESOPHAGOGASTRODUODENOSCOPY (  EGD) WITH ESOPHAGEAL DILATION  ~ 1982   Dr. Sharlett Iles  . EXCISION OF ACTINIC KERATOSIS     DR LUPTON   . EYE SURGERY    . INSERT / REPLACE / REMOVE PACEMAKER    . JOINT REPLACEMENT    . KNEE ARTHROSCOPY Left 2003  . KNEE ARTHROSCOPY Right 06-26-13  . KNEE CLOSED REDUCTION Right 10/23/2013   Procedure: CLOSED MANIPULATION RIGHT KNEE;  Surgeon: Mcarthur Rossetti, MD;  Location: [place];  Service: Orthopedics;  Laterality: Right;  . LASER FOR CLOUDY CAP LEFT EYE Left 03/2006   DR DIGBY  . LUMBAR LAMINECTOMY/DECOMPRESSION MICRODISCECTOMY N/A 06/12/2019   Procedure: Laminectomy and Foraminotomy - Lumbar four-Lumbar five;  Surgeon: Eustace Moore, MD;  Location: [place];  Service: Neurosurgery;  Laterality: N/A;  . [facility], Dr.Gray. Removed area on upper chest  . TOTAL KNEE ARTHROPLASTY Left 04/2004   DR RENDALL  . TOTAL KNEE ARTHROPLASTY Right 07/04/2013   Procedure: RIGHT TOTAL KNEE ARTHROPLASTY;  Surgeon: Mcarthur Rossetti, MD;  Location: [facility];  Service: Orthopedics;  Laterality: Right;   . TRIGGER FINGER RELEASE Right 09/2018  . VAGINAL HYSTERECTOMY  1979   Family History  Problem Relation Age of Onset  . Ovarian cancer Mother   . Uterine cancer Mother   . Cerebrovascular Accident Mother   . Heart disease Father   . Hypertension Brother   . Obesity Daughter    Social History   Socioeconomic History  . Marital status: Married    Spouse name: Not on file  . Number of children: Not on file  . Years of education: Not on file  . Highest education level: Not on file  Occupational History  . Not on file  Tobacco Use  . Smoking status: Never Smoker  . Smokeless tobacco: Never Used  Vaping Use  . Vaping Use: Never used  Substance and Sexual Activity  . Alcohol use: No  . Drug use: No  . Sexual activity: Yes  Other Topics Concern  . Not on file  Social History Narrative  . Not on file   Social Determinants of Health   Financial Resource Strain: Not on file  Food Insecurity: Not on file  Transportation Needs: Not on file  Physical Activity: Not on file  Stress: Not on file  Social Connections: Not on file    Tobacco Counseling Counseling given: Not Answered   Clinical Intake:  Pre-visit preparation completed: Yes  Pain : No/denies pain     BMI - recorded: 33 Nutritional Status: BMI > 30  Obese Nutritional Risks: None Diabetes: No  How often do you need to have someone help you when you read instructions, pamphlets, or other written materials from your doctor or pharmacy?: 1 - Never  Diabetic?no         Activities of Daily Living In your present state of health, do you have any difficulty performing the following activities: 01/24/2021  Hearing? N  Vision? N  Difficulty concentrating or making decisions? N  Walking or climbing stairs? N  Dressing or bathing? N  Doing errands, shopping? N  Preparing Food and eating ? N  Using the Toilet? N  In the past six months, have you accidently leaked urine? Y  Do you have problems with  loss of bowel control? N  Managing your Medications? N  Managing your Finances? N  Housekeeping or managing your Housekeeping? N  Some recent data might be hidden  Patient Care Team: Lauree Chandler, NP as PCP - General (Geriatric Medicine) [place], Peter M, MD as PCP - Cardiology (Cardiology) Mcarthur Rossetti, MD as Attending Physician (Orthopedic Surgery) Larey Dresser, MD as Attending Physician (Cardiology) Thornell Sartorius, MD as Consulting Physician (Otolaryngology) Druscilla Brownie, MD as Consulting Physician (Dermatology) Calvert Cantor, MD as Consulting Physician (Ophthalmology) Debara Pickett Nadean Corwin, MD as Consulting Physician (Cardiology) Eustace Moore, MD as Consulting Physician (Neurosurgery)  Indicate any recent Medical Services you may have received from other than [facility] providers in the past year (date may be approximate).     Assessment:   This is a routine wellness examination for [place].  Hearing/Vision screen  Hearing Screening   125Hz  250Hz  500Hz  1000Hz  2000Hz  3000Hz  4000Hz  6000Hz  8000Hz   Right ear:           Left ear:           Comments: No hearing issues   Vision Screening Comments: Eye exam performed every 4 months by Dr.Digby  Dietary issues and exercise activities discussed: Current Exercise Habits: Home exercise routine, Type of exercise: walking, Time (Minutes): 20, Frequency (Times/Week): 5, Weekly Exercise (Minutes/Week): 100  Goals    .  DIET - INCREASE WATER INTAKE      Patient will increase water by 1-2 cups    .  Increase water intake      Starting 12/12/16, I will attempt to increase my water intake to 5 glasses per day.     .  Weight (lb) < 175 lb (79.4 kg) (pt-stated)      Depression Screen PHQ 2/9 Scores 01/24/2021 10/01/2020 01/12/2020 01/06/2019 01/02/2018 11/21/2017 12/12/2016  PHQ - 2 Score 0 0 0 0 0 0 0    Fall Risk Fall Risk  01/24/2021 12/28/2020 10/01/2020 04/21/2020 04/02/2020  Falls in the past year? 1 1 0 0 0  Number  falls in past yr: 0 0 0 0 0  Comment - - - - -  Injury with Fall? 0 1 0 0 0  Comment - Injured back of head - - -  Risk Factor Category  - - - - -  Risk for fall due to : History of fall(s) History of fall(s) - - -  Follow up - - - - -  Comment - - - - -    FALL RISK PREVENTION PERTAINING TO THE HOME:  Any stairs in or around the home? Yes  If so, are there any without handrails? No  Home free of loose throw rugs in walkways, pet beds, electrical cords, etc? Yes  Adequate lighting in your home to reduce risk of falls? Yes   ASSISTIVE DEVICES UTILIZED TO PREVENT FALLS:  Life alert? No  Use of a cane, walker or w/c? No  Grab bars in the bathroom? Yes  Shower chair or bench in shower? No  Elevated toilet seat or a handicapped toilet? Yes   TIMED UP AND GO:  Was the test performed? No .    Cognitive Function: MMSE - Mini Mental State Exam 01/06/2019 11/21/2017 12/12/2016 11/30/2015  Orientation to time 5 5 5 5   Orientation to Place 5 5 5 5   Registration 3 3 3 3   Attention/ Calculation 5 5 5 5   Recall 3 3 3 3   Language- name 2 objects 2 2 2 2   Language- repeat 1 1 1 1   Language- follow 3 step command 3 3 3 3   Language- read & follow direction 1 1 1 1   Write  a sentence 1 1 1 1   Copy design 1 1 1 1   Total score 30 30 30 30      6CIT Screen 01/24/2021 01/12/2020  What Year? 0 points 0 points  What month? 0 points 0 points  What time? 0 points 0 points  Count back from 20 0 points 0 points  Months in reverse 0 points 0 points  Repeat phrase 0 points 0 points  Total Score 0 0    Immunizations Immunization History  Administered Date(s) Administered  . Fluad Quad(high Dose 65+) 09/29/2019, 10/01/2020  . Influenza, High Dose Seasonal PF 09/14/2017, 10/02/2018  . Influenza,inj,Quad PF,6+ Mos 09/23/2013, 09/25/2014, 10/11/2015, 08/30/2016  . PFIZER(Purple Top)SARS-COV-2 Vaccination 01/16/2020, 02/06/2020, 09/29/2020  . Pneumococcal Conjugate-13 02/17/2015  . Pneumococcal  Polysaccharide-23 12/12/2016  . Tdap 06/24/2017  . Zoster 01/29/2008  . Zoster Recombinat (Shingrix) 09/04/2017, 02/21/2018    TDAP status: Up to date  Flu Vaccine status: Up to date  Pneumococcal vaccine status: Up to date  Covid-19 vaccine status: Completed vaccines  Qualifies for Shingles Vaccine? Yes   Zostavax completed Yes   Shingrix Completed?: Yes  Screening Tests Health Maintenance  Topic Date Due  . MAMMOGRAM  10/05/2021  . TETANUS/TDAP  06/25/2027  . INFLUENZA VACCINE  Completed  . DEXA SCAN  Completed  . COVID-19 Vaccine  Completed  . PNA vac Low Risk Adult  Completed    Health Maintenance  There are no preventive care reminders to display for this patient.  Colorectal cancer screening: No longer required.     Bone Density status: Ordered today. Pt provided with contact info and advised to call to schedule appt.  Lung Cancer Screening: (Low Dose CT Chest recommended if Age 10-80 years, 30 pack-year currently smoking OR have quit w/in 15years.) does not qualify.     Additional Screening:  Hepatitis C Screening: does not qualify;  Vision Screening: Recommended annual ophthalmology exams for early detection of glaucoma and other disorders of the eye. Is the patient up to date with their annual eye exam?  No  Who is the provider or what is the name of the office in which the patient attends annual eye exams? [facility] If pt is not established with a provider, would they like to be referred to a provider to establish care? No .   Dental Screening: Recommended annual dental exams for proper oral hygiene  Community Resource Referral / Chronic Care Management: CRR required this visit?  No   CCM required this visit?  No      Plan:     I have personally reviewed and noted the following in the patient's chart:   . Medical and social history . Use of alcohol, tobacco or illicit drugs  . Current medications and supplements . Functional ability and  status . Nutritional status . Physical activity . Advanced directives . List of other physicians . Hospitalizations, surgeries, and ER visits in previous 12 months . Vitals . Screenings to include cognitive, depression, and falls . Referrals and appointments  In addition, I have reviewed and discussed with patient certain preventive protocols, quality metrics, and best practice recommendations. A written personalized care plan for preventive services as well as general preventive health recommendations were provided to patient.     Lauree Chandler, NP   01/24/2021   Virtual Visit via Telephone Note  I connected with@ on 01/24/21 at 10:00 AM EST by telephone and verified that I am speaking with the correct person using two identifiers.  Location:  Patient: home Provider: [facility]   I discussed the limitations, risks, security and privacy concerns of performing an evaluation and management service by telephone and the availability of in person appointments. I also discussed with the patient that there may be a patient responsible charge related to this service. The patient expressed understanding and agreed to proceed.   I discussed the assessment and treatment plan with the patient. The patient was provided an opportunity to ask questions and all were answered. The patient agreed with the plan and demonstrated an understanding of the instructions.   The patient was advised to call back or seek an in-person evaluation if the symptoms worsen or if the condition fails to improve as anticipated.  I provided 18 minutes of non-face-to-face time during this encounter.  Carlos American. Harle Battiest Avs printed and mailed

## 2021-01-25 ENCOUNTER — Other Ambulatory Visit: Payer: Self-pay | Admitting: Nurse Practitioner

## 2021-01-25 DIAGNOSIS — E2839 Other primary ovarian failure: Secondary | ICD-10-CM

## 2021-02-02 ENCOUNTER — Ambulatory Visit: Payer: Medicare Other | Admitting: Physician Assistant

## 2021-02-02 ENCOUNTER — Encounter: Payer: Self-pay | Admitting: Physician Assistant

## 2021-02-02 DIAGNOSIS — M5441 Lumbago with sciatica, right side: Secondary | ICD-10-CM | POA: Diagnosis not present

## 2021-02-02 MED ORDER — METHYLPREDNISOLONE 4 MG PO TABS: ORAL_TABLET | ORAL | 0 refills | Status: DC

## 2021-02-02 NOTE — Progress Notes (Signed)
Office Visit Note   Patient: [name]           Date of Birth: October 22, 1931           MRN: [id number] Visit Date: 02/02/2021              Requested by: Lauree Chandler, NP [place],  [place] [id number] PCP: Lauree Chandler, NP   Assessment & Plan: Visit Diagnoses:  1. Acute right-sided low back pain with right-sided sciatica     Plan: Due to the fact the patient is having radicular symptoms down the right leg again and has had prior surgery with Dr. Ronnald Ramp in 2020 recommend referral back to Dr. Ronnald Ramp for further work-up and treatment.  Questions were encouraged and answered.  We did place her on a Medrol Dosepak to hopefully give her some relief.  Encouraged her to use a cane in her left hand when outside the home due to the fact that she feels like she is unable to walk or move due to the pain in the right leg at times.  Follow-Up Instructions: Return if symptoms worsen or fail to improve.   Orders:  No orders of the defined types were placed in this encounter.  Meds ordered this encounter  Medications  . methylPREDNISolone (MEDROL) 4 MG tablet    Sig: Take as directed    Dispense:  21 tablet    Refill:  0      Procedures: No procedures performed   Clinical Data: No additional findings.   Subjective: Chief Complaint  Patient presents with  . Right Hip - Pain  . Lower Back - Pain    HPI [name] returns today due to right leg pain.  Is been ongoing for the past 3 to 4 months.  Pain mostly radiates from the knee down to the right ankle.  She notes some numbness.  She does have periodic stabbing-like pain lateral aspect of the right shoulder.  She states makes it such that she cannot walk due to the pain.  She has had no known injury.  Notes her pain is 9 out of 10 at times lateral aspect of the hip.  She states she can have numbness tingling down the leg when standing sitting or lying down.  No bowel bladder dysfunction.  No saddle anesthesia  like symptoms.  Positive for waking pain lateral right hip.  Prior history of decompression lumbar laminectomy medial facet foraminotomy at L4-5 by Dr. Ronnald Ramp June 2020.   Review of Systems See HPI otherwise negative  Objective: Vital Signs: There were no vitals taken for this visit.  Physical Exam General: Well-developed well-nourished pleasant female in no acute distress. Psych: Alert and oriented x3 Ortho Exam Hips good range of motion without pain.  Positive straight leg raise on the right.  Weakness 4 out of 5 strength right hip flexion.  Otherwise 5 out of 5 strength throughout the lower extremities.  Nontender over the right hip trochanteric region.  Specialty Comments:  No specialty comments available.  Imaging: No results found.   PMFS History: Patient Active Problem List   Diagnosis Date Noted  . Sinus node dysfunction ([place]) 10/29/2019  . S/P lumbar laminectomy 06/12/2019  . Osteoporosis 01/28/2019  . Trochanteric bursitis, right hip 05/16/2017  . Cardiac pacemaker in situ 06/21/2016  . Atypical atrial flutter ([place])   . Chest pain 04/10/2016  . Atrial fibrillation ([place]) 03/29/2016  . Hyperglycemia 02/17/2015  . Obese 02/17/2015  .  Insomnia 01/27/2014  . 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 Knee joint replacement by other means 10/01/2013  . Arthritis of knee, right 07/04/2013  . Osteoarthritis of right knee 07/01/2013  . Bradycardia 05/13/2013  . HYPERCHOLESTEROLEMIA 06/02/2009  . Essential hypertension 06/02/2009  . ESOPHAGEAL STRICTURE 06/02/2009  . GERD 06/02/2009  . HIATAL HERNIA 06/02/2009  . DIVERTICULOSIS, COLON 06/02/2009  . Dysphagia, pharyngoesophageal phase 06/02/2009  . Backache 05/01/2007  . LEG PAIN, RIGHT 05/01/2007   Past Medical History:  Diagnosis Date  . Allergic rhinitis due to pollen   . Anxiety state, unspecified    Sitautional- pain  . Arthritis    "right knee" (06/21/2016)  . Asymptomatic varicose veins   . Carpal tunnel  syndrome   . Chest pain, unspecified   . Chronic lower back pain    "on the left side" (06/21/2016)  . Constipation   . Cramp of limb   . Depressive disorder, not elsewhere classified   . Diaphragmatic hernia without mention of obstruction or gangrene   . Diaphragmatic hernia without mention of obstruction or gangrene   . Disturbance of skin sensation   . Diverticulosis of colon (without mention of hemorrhage)   . Dysrhythmia    Atrial Flutter  . Enthesopathy of hip region   . Esophageal reflux    , just occasional  . History of blood transfusion    "w/both knee replacements"  . History of duodenal ulcer   . History of kidney stones    passed  . Insomnia, unspecified   . Lumbago   . Migraine    "none since ~ 1990" (06/21/2016)  . Myalgia and myositis, unspecified   . Obesity, unspecified   . Other abnormal blood chemistry   . Other dysphagia   . Other malaise and fatigue   . Other nonspecific abnormal serum enzyme levels   . Other specified cardiac dysrhythmias(427.89)   . Pacemaker   . Pain in joint, ankle and foot   . Pain in joint, hand   . Pain in joint, lower leg   . Pain in joint, pelvic region and thigh   . Pain in limb   . Plantar fascial fibromatosis   . Presence of permanent cardiac pacemaker    -St Jude  . Reflux esophagitis   . Sciatica   . Spinal stenosis, unspecified region other than cervical   . Stricture and stenosis of esophagus   . Symptomatic menopausal or female climacteric states   . Unspecified essential hypertension   . Unspecified essential hypertension   . Unspecified vitamin D deficiency     Family History  Problem Relation Age of Onset  . Ovarian cancer Mother   . Uterine cancer Mother   . Cerebrovascular Accident Mother   . Heart disease Father   . Hypertension Brother   . Obesity Daughter     Past Surgical History:  Procedure Laterality Date  . BREAST BIOPSY Left 1990s X 2  . CARDIOVERSION N/A 04/26/2016   Procedure:  CARDIOVERSION;  Surgeon: Pixie Casino, MD;  Location: [facility];  Service: Cardiovascular;  Laterality: N/A;  . CATARACT EXTRACTION W/ INTRAOCULAR LENS IMPLANT Left 08/03/1999   DR EPES   . CATARACT EXTRACTION W/ INTRAOCULAR LENS IMPLANT Right 2000   DR EPES  . [place]  . COLONOSCOPY  1988   Normal   . DENTAL SURGERY Left 08/2016  . EP IMPLANTABLE DEVICE N/A 06/21/2016   Procedure: Pacemaker Implant;  Surgeon:  Evans Lance, MD;  Location: [facility];  Service: Cardiovascular;  Laterality: N/A;  . ESOPHAGOGASTRODUODENOSCOPY (EGD) WITH ESOPHAGEAL DILATION  ~ 1982   Dr. Sharlett Iles  . EXCISION OF ACTINIC KERATOSIS     DR LUPTON   . EYE SURGERY    . INSERT / REPLACE / REMOVE PACEMAKER    . JOINT REPLACEMENT    . KNEE ARTHROSCOPY Left 2003  . KNEE ARTHROSCOPY Right 06-26-13  . KNEE CLOSED REDUCTION Right 10/23/2013   Procedure: CLOSED MANIPULATION RIGHT KNEE;  Surgeon: Mcarthur Rossetti, MD;  Location: [facility];  Service: Orthopedics;  Laterality: Right;  . LASER FOR CLOUDY CAP LEFT EYE Left 03/2006   DR DIGBY  . LUMBAR LAMINECTOMY/DECOMPRESSION MICRODISCECTOMY N/A 06/12/2019   Procedure: Laminectomy and Foraminotomy - Lumbar four-Lumbar five;  Surgeon: Eustace Moore, MD;  Location: [facility];  Service: Neurosurgery;  Laterality: N/A;  . [facility], Dr.Gray. Removed area on upper chest  . TOTAL KNEE ARTHROPLASTY Left 04/2004   DR RENDALL  . TOTAL KNEE ARTHROPLASTY Right 07/04/2013   Procedure: RIGHT TOTAL KNEE ARTHROPLASTY;  Surgeon: Mcarthur Rossetti, MD;  Location: [facility];  Service: Orthopedics;  Laterality: Right;  . TRIGGER FINGER RELEASE Right 09/2018  . VAGINAL HYSTERECTOMY  1979   Social History   Occupational History  . Not on file  Tobacco Use  . Smoking status: Never Smoker  . Smokeless tobacco: Never Used  Vaping Use  . Vaping Use: Never used  Substance and Sexual Activity  . Alcohol use: No  .  Drug use: No  . Sexual activity: Yes

## 2021-02-02 NOTE — Addendum Note (Signed)
Addended by: Robyne Peers on: 02/02/2021 03:04 PM   Modules accepted: Orders

## 2021-02-25 ENCOUNTER — Other Ambulatory Visit: Payer: Self-pay | Admitting: Cardiology

## 2021-03-22 ENCOUNTER — Ambulatory Visit (INDEPENDENT_AMBULATORY_CARE_PROVIDER_SITE_OTHER): Payer: Medicare Other

## 2021-03-22 DIAGNOSIS — I480 Paroxysmal atrial fibrillation: Secondary | ICD-10-CM

## 2021-03-22 LAB — CUP PACEART REMOTE DEVICE CHECK
Battery Remaining Longevity: 119 mo
Battery Remaining Percentage: 95.5 %
Battery Voltage: 2.98 V
Brady Statistic AP VP Percent: 17 %
Brady Statistic AP VS Percent: 82 %
Brady Statistic AS VP Percent: 1 %
Brady Statistic AS VS Percent: 1 %
Brady Statistic RA Percent Paced: 99 %
Brady Statistic RV Percent Paced: 17 %
Date Time Interrogation Session: 20220329020012
Implantable Lead Implant Date: 20170628
Implantable Lead Implant Date: 20170628
Implantable Lead Location: 753859
Implantable Lead Location: 753860
Implantable Pulse Generator Implant Date: 20170628
Lead Channel Impedance Value: 490 Ohm
Lead Channel Impedance Value: 600 Ohm
Lead Channel Pacing Threshold Amplitude: 0.75 V
Lead Channel Pacing Threshold Amplitude: 1.25 V
Lead Channel Pacing Threshold Pulse Width: 0.4 ms
Lead Channel Pacing Threshold Pulse Width: 0.4 ms
Lead Channel Sensing Intrinsic Amplitude: 1.1 mV
Lead Channel Sensing Intrinsic Amplitude: 7.7 mV
Lead Channel Setting Pacing Amplitude: 2 V
Lead Channel Setting Pacing Amplitude: 2.5 V
Lead Channel Setting Pacing Pulse Width: 0.4 ms
Lead Channel Setting Sensing Sensitivity: 2 mV
Pulse Gen Model: 2272
Pulse Gen Serial Number: 7910313

## 2021-04-02 ENCOUNTER — Other Ambulatory Visit: Payer: Self-pay | Admitting: Cardiology

## 2021-04-02 DIAGNOSIS — I4819 Other persistent atrial fibrillation: Secondary | ICD-10-CM

## 2021-04-04 ENCOUNTER — Encounter: Payer: Self-pay | Admitting: Nurse Practitioner

## 2021-04-04 ENCOUNTER — Other Ambulatory Visit: Payer: Self-pay

## 2021-04-04 ENCOUNTER — Ambulatory Visit: Payer: Medicare Other | Admitting: Nurse Practitioner

## 2021-04-04 VITALS — BP 124/78 | HR 64 | Temp 97.3°F | Ht 63.0 in | Wt 186.0 lb

## 2021-04-04 DIAGNOSIS — I10 Essential (primary) hypertension: Secondary | ICD-10-CM | POA: Diagnosis not present

## 2021-04-04 DIAGNOSIS — M48062 Spinal stenosis, lumbar region with neurogenic claudication: Secondary | ICD-10-CM

## 2021-04-04 DIAGNOSIS — I480 Paroxysmal atrial fibrillation: Secondary | ICD-10-CM | POA: Diagnosis not present

## 2021-04-04 DIAGNOSIS — F411 Generalized anxiety disorder: Secondary | ICD-10-CM

## 2021-04-04 DIAGNOSIS — H6121 Impacted cerumen, right ear: Secondary | ICD-10-CM

## 2021-04-04 DIAGNOSIS — K219 Gastro-esophageal reflux disease without esophagitis: Secondary | ICD-10-CM

## 2021-04-04 DIAGNOSIS — E785 Hyperlipidemia, unspecified: Secondary | ICD-10-CM

## 2021-04-04 MED ORDER — ESOMEPRAZOLE MAGNESIUM 20 MG PO CPDR: 20.0000 mg | DELAYED_RELEASE_CAPSULE | ORAL | 1 refills | Status: DC | PRN

## 2021-04-04 NOTE — Patient Instructions (Signed)
Fasting lab work this week  Follow up in 6 months.

## 2021-04-04 NOTE — Progress Notes (Signed)
Careteam: Patient Care Team: Lauree Chandler, NP as PCP - General (Geriatric Medicine) Martinique, Peter M, MD as PCP - Cardiology (Cardiology) Mcarthur Rossetti, MD as Attending Physician (Orthopedic Surgery) Larey Dresser, MD as Attending Physician (Cardiology) Thornell Sartorius, MD as Consulting Physician (Otolaryngology) Druscilla Brownie, MD as Consulting Physician (Dermatology) Calvert Cantor, MD as Consulting Physician (Ophthalmology) Debara Pickett Nadean Corwin, MD as Consulting Physician (Cardiology) Eustace Moore, MD as Consulting Physician (Neurosurgery)  PLACE OF SERVICE:  [facility]  Advanced Directive information    Allergies  Allergen Reactions  . Advil [Ibuprofen]     GI upset  . Aleve [Naproxen Sodium]     GI upset  . Aspirin Nausea And Vomiting    Takes baby aspirin qod without problems  . Atorvastatin     Leg cramps  . Celebrex [Celecoxib]     GI upset  . Diclofenac     GI upset  . Doxycycline     Headache  . Fosamax [Alendronate Sodium]     GI discomfort   . Metoclopramide Hcl     REACTION: heart palps  . Nsaids Other (See Comments)    Tears my stomach up   . Other     Antihistamine - increase BP  . Pravastatin     Leg cramps  . Timolol     Dropped HR    Chief Complaint  Patient presents with  . Medical Management of Chronic Issues    6 month follow-up. Ongoing soreness from fall in January 2022     HPI: Patient is a 87 y.o. female for routine follow up.  Had a fall in January while putting up [employer]. She followed up with orthopedic and was put on prednisone dose pack.  She continues to be sore after the fall. Neck is stiff and right lower leg painful.   She was recommended to follow up with Dr Ronnald Ramp. Having more pain in her back and legs. Would like to go to physical therapy and feels like he will order this and likely do further testing due to the pain.  Sleeps in a recliner due to pain.   Hx of sinus node  dysfunction and a fib-s/p PPM insertion on propafenone and metoprolol. Also on eliquis for anticoagulation.   Osteoporosis- recommended prolia due to some worsening GERD with fosamax but now fosamax is not giving her any stomach issues so she will continue.    htn- continues on valsartan- hctz  Hyperlipidemia- on zetia- needs fasting lipids  Just got back from a trip- ate a lot of wonderful food.   GERD- controlled on nexium   Review of Systems:  Review of Systems  Constitutional: Negative for chills, fever and weight loss.  HENT: Negative for tinnitus.   Respiratory: Negative for cough, sputum production and shortness of breath.   Cardiovascular: Negative for chest pain, palpitations and leg swelling.  Gastrointestinal: Negative for abdominal pain, constipation, diarrhea and heartburn.  Genitourinary: Negative for dysuria, frequency and urgency.  Musculoskeletal: Positive for back pain and myalgias. Negative for falls and joint pain.  Skin: Negative.   Neurological: Negative for dizziness and headaches.  Psychiatric/Behavioral: Negative for depression and memory loss. The patient does not have insomnia.     Past Medical History:  Diagnosis Date  . Allergic rhinitis due to pollen   . Anxiety state, unspecified    Sitautional- pain  . Arthritis    "right knee" (06/21/2016)  . Asymptomatic varicose veins   .  Carpal tunnel syndrome   . Chest pain, unspecified   . Chronic lower back pain    "on the left side" (06/21/2016)  . Constipation   . Cramp of limb   . Depressive disorder, not elsewhere classified   . Diaphragmatic hernia without mention of obstruction or gangrene   . Diaphragmatic hernia without mention of obstruction or gangrene   . Disturbance of skin sensation   . Diverticulosis of colon (without mention of hemorrhage)   . Dysrhythmia    Atrial Flutter  . Enthesopathy of hip region   . Esophageal reflux    , just occasional  . History of blood transfusion     "w/both knee replacements"  . History of duodenal ulcer   . History of kidney stones    passed  . Insomnia, unspecified   . Lumbago   . Migraine    "none since ~ 1990" (06/21/2016)  . Myalgia and myositis, unspecified   . Obesity, unspecified   . Other abnormal blood chemistry   . Other dysphagia   . Other malaise and fatigue   . Other nonspecific abnormal serum enzyme levels   . Other specified cardiac dysrhythmias(427.89)   . Pacemaker   . Pain in joint, ankle and foot   . Pain in joint, hand   . Pain in joint, lower leg   . Pain in joint, pelvic region and thigh   . Pain in limb   . Plantar fascial fibromatosis   . Presence of permanent cardiac pacemaker    -St Jude  . Reflux esophagitis   . Sciatica   . Spinal stenosis, unspecified region other than cervical   . Stricture and stenosis of esophagus   . Symptomatic menopausal or female climacteric states   . Unspecified essential hypertension   . Unspecified essential hypertension   . Unspecified vitamin D deficiency    Past Surgical History:  Procedure Laterality Date  . BREAST BIOPSY Left 1990s X 2  . CARDIOVERSION N/A 04/26/2016   Procedure: CARDIOVERSION;  Surgeon: Pixie Casino, MD;  Location: [facility];  Service: Cardiovascular;  Laterality: N/A;  . CATARACT EXTRACTION W/ INTRAOCULAR LENS IMPLANT Left 08/03/1999   DR EPES   . CATARACT EXTRACTION W/ INTRAOCULAR LENS IMPLANT Right 2000   DR EPES  . [place]  . COLONOSCOPY  1988   Normal   . DENTAL SURGERY Left 08/2016  . EP IMPLANTABLE DEVICE N/A 06/21/2016   Procedure: Pacemaker Implant;  Surgeon: Evans Lance, MD;  Location: [facility];  Service: Cardiovascular;  Laterality: N/A;  . ESOPHAGOGASTRODUODENOSCOPY (EGD) WITH ESOPHAGEAL DILATION  ~ 1982   Dr. Sharlett Iles  . EXCISION OF ACTINIC KERATOSIS     DR LUPTON   . EYE SURGERY    . INSERT / REPLACE / REMOVE PACEMAKER    . JOINT REPLACEMENT    . KNEE ARTHROSCOPY  Left 2003  . KNEE ARTHROSCOPY Right 06-26-13  . KNEE CLOSED REDUCTION Right 10/23/2013   Procedure: CLOSED MANIPULATION RIGHT KNEE;  Surgeon: Mcarthur Rossetti, MD;  Location: [facility];  Service: Orthopedics;  Laterality: Right;  . LASER FOR CLOUDY CAP LEFT EYE Left 03/2006   DR DIGBY  . LUMBAR LAMINECTOMY/DECOMPRESSION MICRODISCECTOMY N/A 06/12/2019   Procedure: Laminectomy and Foraminotomy - Lumbar four-Lumbar five;  Surgeon: Eustace Moore, MD;  Location: [facility];  Service: Neurosurgery;  Laterality: N/A;  . [facility], Dr.Gray. Removed area on upper  chest  . TOTAL KNEE ARTHROPLASTY Left 04/2004   DR RENDALL  . TOTAL KNEE ARTHROPLASTY Right 07/04/2013   Procedure: RIGHT TOTAL KNEE ARTHROPLASTY;  Surgeon: Mcarthur Rossetti, MD;  [facility];  Service: Orthopedics;  Laterality: Right;  . TRIGGER FINGER RELEASE Right 09/2018  . VAGINAL HYSTERECTOMY  1979   Social History:   reports that she has never smoked. She has never used smokeless tobacco. She reports that she does not drink alcohol and does not use drugs.  Family History  Problem Relation Age of Onset  . Ovarian cancer Mother   . Uterine cancer Mother   . Cerebrovascular Accident Mother   . Heart disease Father   . Hypertension Brother   . Obesity Daughter     Medications: Patient's Medications  New Prescriptions   No medications on file  Previous Medications   ACETAMINOPHEN (TYLENOL) 500 MG TABLET    Take 500-2,000 mg by mouth daily.   ALENDRONATE (FOSAMAX) 70 MG TABLET    TAKE 1 TABLET BY MOUTH EVERY 7 (SEVEN) DAYS. TAKE WITH A FULL GLASS OF WATER ON AN EMPTY STOMACH.   ANTISEPTIC ORAL RINSE (BIOTENE) LIQD    15 mLs by Mouth Rinse route daily.   ARTIFICIAL SALIVA (ACT DRY MOUTH) LOZG    Use as directed 1 drop in the mouth or throat as needed (dry mouth).   BIMATOPROST (LUMIGAN) 0.01 % SOLN    Place 1 drop into both eyes at bedtime.    CALCIUM CARB-CHOLECALCIFEROL (CALTRATE 600+D3)  600-800 MG-UNIT TABS    1 tablet twice daily   CHOLECALCIFEROL (VITAMIN D) 2000 UNITS TABLET    Take 2,000 Units by mouth daily.   DORZOLAMIDE (TRUSOPT) 2 % OPHTHALMIC SOLUTION    Place 1 drop into both eyes 3 (three) times daily.   ELIQUIS 5 MG TABS TABLET    TAKE 1 TABLET BY MOUTH TWICE A DAY   ESOMEPRAZOLE (NEXIUM) 20 MG CAPSULE    Take 1 capsule (20 mg total) by mouth as needed.   EZETIMIBE (ZETIA) 10 MG TABLET    TAKE 1 TABLET ONCE DAILY.   FLUTICASONE (FLONASE) 50 MCG/ACT NASAL SPRAY    INHALE 1 SPRAY INTO BOTH NOSTRILS DAILY   METHYLCELLULOSE (ARTIFICIAL TEARS) 1 % OPHTHALMIC SOLUTION    Place 1 drop into both eyes daily as needed (for dry eyes).    METHYLPREDNISOLONE (MEDROL) 4 MG TABLET    Take as directed   METOPROLOL TARTRATE (LOPRESSOR) 25 MG TABLET    Take 1 tablet (25 mg total) by mouth 2 (two) times daily.   PROPAFENONE (RYTHMOL SR) 225 MG 12 HR CAPSULE    TAKE 1 CAPSULE BY MOUTH TWICE A DAY   TIZANIDINE (ZANAFLEX) 2 MG CAPSULE    TAKE 1 CAPSULE (2 MG TOTAL) BY MOUTH AT BEDTIME.   TRAZODONE (DESYREL) 50 MG TABLET    TAKE 1 TABLET BY MOUTH EVERYDAY AT BEDTIME   TROLAMINE SALICYLATE (ASPERCREME) 10 % CREAM    Apply 1 application topically as needed for muscle pain (for knee pain).   VALSARTAN-HYDROCHLOROTHIAZIDE (DIOVAN-HCT) 320-25 MG TABLET    TAKE 1 TABLET ONCE DAILY.  Modified Medications   No medications on file  Discontinued Medications   No medications on file    Physical Exam:  Vitals:   04/04/21 1307  BP: 124/78  Pulse: 64  Temp: (!) 97.3 F (36.3 C)  TempSrc: Temporal  SpO2: 95%  Weight: 186 lb (84.4 kg)  Height: 5\' 3"  (1.6 m)  Body mass index is 32.95 kg/m. Wt Readings from Last 3 Encounters:  04/04/21 186 lb (84.4 kg)  12/28/20 186 lb 9.6 oz (84.6 kg)  11/16/20 185 lb (83.9 kg)    Physical Exam Constitutional:      General: She is not in acute distress.    Appearance: She is well-developed. She is not diaphoretic.  HENT:     Head: Normocephalic  and atraumatic.     Mouth/Throat:     Pharynx: No oropharyngeal exudate.  Eyes:     Conjunctiva/sclera: Conjunctivae normal.     Pupils: Pupils are equal, round, and reactive to light.  Cardiovascular:     Rate and Rhythm: Normal rate and regular rhythm.     Heart sounds: Normal heart sounds.  Pulmonary:     Effort: Pulmonary effort is normal.     Breath sounds: Normal breath sounds.  Abdominal:     General: Bowel sounds are normal.     Palpations: Abdomen is soft.  Musculoskeletal:        General: Tenderness (low back) present.     Cervical back: Normal range of motion and neck supple.  Skin:    General: Skin is warm and dry.  Neurological:     Mental Status: She is alert and oriented to person, place, and time.     Labs reviewed: Basic Metabolic Panel: Recent Labs    09/17/20 0811 09/21/20 0816 01/17/21 0938  NA 140  --   --   K 4.0  --   --   CL 101  --   --   CO2 26  --   --   GLUCOSE 92  --   --   BUN 18  --   --   CREATININE 0.81  --   --   CALCIUM 9.5  --   --   TSH 6.200* 7.060* 3.10   Liver Function Tests: No results for input(s): AST, ALT, ALKPHOS, BILITOT, PROT, ALBUMIN in the last 8760 hours. No results for input(s): LIPASE, AMYLASE in the last 8760 hours. No results for input(s): AMMONIA in the last 8760 hours. CBC: Recent Labs    09/17/20 0811  WBC 5.0  NEUTROABS 2.2  HGB 13.1  HCT 38.5  MCV 93  PLT 180   Lipid Panel: No results for input(s): CHOL, HDL, LDLCALC, TRIG, CHOLHDL, LDLDIRECT in the last 8760 hours. TSH: Recent Labs    09/17/20 0811 09/21/20 0816 01/17/21 0938  TSH 6.200* 7.060* 3.10   A1C: Lab Results  Component Value Date   HGBA1C 5.2 08/06/2017     Assessment/Plan 1. Gastroesophageal reflux disease without esophagitis -stable at this time, without worsening symptoms. - esomeprazole (NEXIUM) 20 MG capsule; Take 1 capsule (20 mg total) by mouth as needed.  Dispense: 90 capsule; Refill: 1  2. Essential  hypertension Well controlled on current regimen. Continue dietary modifications. - COMPLETE METABOLIC PANEL WITH GFR; Future - CBC with Differential/Platelet; Future  3. Paroxysmal atrial fibrillation (HCC) -rate controlled, continue on eliquis for anticoaguation - CBC with Differential/Platelet; Future  4. Spinal stenosis of lumbar region with neurogenic claudication Worsening pain, plans to follow up with Dr Ronnald Ramp Neurosurgery fur further evaluation and recommendations.   5. Anxiety state -stable at this time.   6. Hyperlipidemia, unspecified hyperlipidemia type -diet modifications encouraged, continues on zetia.  - COMPLETE METABOLIC PANEL WITH GFR; Future - Lipid Panel; Future  7. Right ear impacted cerumen Unable to remove with lavage. To use debrox BID for 4 days and  return to office to have flushed.  Next appt: 6 months for routine follow up Fasting labs and ear lavage next week.  Carlos American. [place], [facility] [phone]

## 2021-04-04 NOTE — Telephone Encounter (Signed)
Prescription refill request for Eliquis received. Indication: atrial fib Last office visit:11/21 taylor Scr:0.81 Age: 87 Weight:84.6 kg  Prescription refilled

## 2021-04-05 NOTE — Progress Notes (Signed)
Remote pacemaker transmission.   

## 2021-04-11 ENCOUNTER — Other Ambulatory Visit: Payer: Medicare Other

## 2021-04-11 ENCOUNTER — Ambulatory Visit: Payer: Medicare Other | Admitting: Nurse Practitioner

## 2021-04-11 ENCOUNTER — Other Ambulatory Visit: Payer: Self-pay

## 2021-04-11 DIAGNOSIS — I10 Essential (primary) hypertension: Secondary | ICD-10-CM

## 2021-04-11 DIAGNOSIS — E785 Hyperlipidemia, unspecified: Secondary | ICD-10-CM

## 2021-04-11 DIAGNOSIS — I480 Paroxysmal atrial fibrillation: Secondary | ICD-10-CM

## 2021-04-11 LAB — CBC WITH DIFFERENTIAL/PLATELET
Absolute Monocytes: 391 cells/uL (ref 200–950)
Basophils Absolute: 51 cells/uL (ref 0–200)
Basophils Relative: 1.1 %
Eosinophils Absolute: 170 cells/uL (ref 15–500)
Eosinophils Relative: 3.7 %
HCT: 38.8 % (ref 35.0–45.0)
Hemoglobin: 12.6 g/dL (ref 11.7–15.5)
Lymphs Abs: 1964 cells/uL (ref 850–3900)
MCH: 30.2 pg (ref 27.0–33.0)
MCHC: 32.5 g/dL (ref 32.0–36.0)
MCV: 93 fL (ref 80.0–100.0)
MPV: 10.3 fL (ref 7.5–12.5)
Monocytes Relative: 8.5 %
Neutro Abs: 2024 cells/uL (ref 1500–7800)
Neutrophils Relative %: 44 %
Platelets: 197 10*3/uL (ref 140–400)
RBC: 4.17 10*6/uL (ref 3.80–5.10)
RDW: 12.5 % (ref 11.0–15.0)
Total Lymphocyte: 42.7 %
WBC: 4.6 10*3/uL (ref 3.8–10.8)

## 2021-04-11 LAB — COMPLETE METABOLIC PANEL WITH GFR
AG Ratio: 1.6 (calc) (ref 1.0–2.5)
ALT: 17 U/L (ref 6–29)
AST: 18 U/L (ref 10–35)
Albumin: 3.9 g/dL (ref 3.6–5.1)
Alkaline phosphatase (APISO): 29 U/L — ABNORMAL LOW (ref 37–153)
BUN: 14 mg/dL (ref 7–25)
CO2: 30 mmol/L (ref 20–32)
Calcium: 8.8 mg/dL (ref 8.6–10.4)
Chloride: 104 mmol/L (ref 98–110)
Creat: 0.65 mg/dL (ref 0.60–0.88)
GFR, Est African American: 93 mL/min/{1.73_m2} (ref >=60)
GFR, Est Non African American: 80 mL/min/{1.73_m2} (ref >=60)
Globulin: 2.5 g/dL (calc) (ref 1.9–3.7)
Glucose, Bld: 98 mg/dL (ref 65–99)
Potassium: 3.9 mmol/L (ref 3.5–5.3)
Sodium: 140 mmol/L (ref 135–146)
Total Bilirubin: 0.5 mg/dL (ref 0.2–1.2)
Total Protein: 6.4 g/dL (ref 6.1–8.1)

## 2021-04-11 LAB — LIPID PANEL
Cholesterol: 143 mg/dL (ref <200)
HDL: 37 mg/dL — ABNORMAL LOW (ref >=50)
LDL Cholesterol (Calc): 78 mg/dL (calc)
Non-HDL Cholesterol (Calc): 106 mg/dL (calc) (ref <130)
Total CHOL/HDL Ratio: 3.9 (calc) (ref <5.0)
Triglycerides: 179 mg/dL — ABNORMAL HIGH (ref <150)

## 2021-04-17 ENCOUNTER — Other Ambulatory Visit: Payer: Self-pay | Admitting: Nurse Practitioner

## 2021-04-18 NOTE — Telephone Encounter (Signed)
Pharmacy requested refill. Pended Rx and sent to Jessica for approval due to HIGH ALERT Warning.  

## 2021-04-21 NOTE — Progress Notes (Signed)
Cardiology Office Note   Date:  04/25/2021   ID:  DONYA HITCH, DOB 08-10-1931, MRN [id number]  PCP:  Lauree Chandler, NP  Cardiologist:   Tymesha Ditmore Martin[place], MD   Chief Complaint  Patient presents with  . Atrial Fibrillation      History of Present Illness: JAIONA SIMIEN is a 87 y.o. female is seen for follow up Afib and dyspnea. She has a long history of marked sinus bradycardia and atrial fibrillation/flutter. She was seen initially in 2014. Echo at that time showed mild LAE otherwise normal. Holter showed mean HR 59 with lowest HR 43 and peak HR 109.    On March 9,2017 she noted an increased HR by BP monitor up to 153. At this time she felt marked indigestion from her waist to her neck. She felt her heart fluttering.   She was seen by Dr. Nyoka Cowden on 03/29/16 and found to be in Afib with RVR. She was started on Eliquis and metoprolol. Myoview study and Echo were normal.   She later had attempt at DCCV. She was in an atypical atrial flutter at that time. DCCV resulted in very prolonged pauses > 6 seconds and return to flutter. She was seen by Dr. Curt Bears and placed on flecainide. This did convert her to NSR but made her feel very sick with nausea, dizziness, and extreme fatigue. Flecainide was discontinued.. She continued to have marked bradycardia and underwent PPM placement on 06/21/16. When seen in September 2018 by Dr. Lovena Le she had an Afib burden of 94%. She was started on propafenone for her Afib. Initially this medication caused her to have more nausea but this has improved.  On her pacemaker checks her Afib burden is less than 1% since December 2019. Last pacemaker check in June was normal.  In June 2020 she underwent L4-5 lumbar laminectomy by Dr Ronnald Ramp. She notes recently some recurrence of numbness in her right leg. Saw Dr Ronnald Ramp and currently getting PT.   She still notes some DOE. Unchanged and episodic. She did have a fall last January. Did hit her head but fortunately her CT  showed no bleed.     Past Medical History:  Diagnosis Date  . Allergic rhinitis due to pollen   . Anxiety state, unspecified    Sitautional- pain  . Arthritis    "right knee" (06/21/2016)  . Asymptomatic varicose veins   . Carpal tunnel syndrome   . Chest pain, unspecified   . Chronic lower back pain    "on the left side" (06/21/2016)  . Constipation   . Cramp of limb   . Depressive disorder, not elsewhere classified   . Diaphragmatic hernia without mention of obstruction or gangrene   . Diaphragmatic hernia without mention of obstruction or gangrene   . Disturbance of skin sensation   . Diverticulosis of colon (without mention of hemorrhage)   . Dysrhythmia    Atrial Flutter  . Enthesopathy of hip region   . Esophageal reflux    , just occasional  . History of blood transfusion    "w/both knee replacements"  . History of duodenal ulcer   . History of kidney stones    passed  . Insomnia, unspecified   . Lumbago   . Migraine    "none since ~ 1990" (06/21/2016)  . Myalgia and myositis, unspecified   . Obesity, unspecified   . Other abnormal blood chemistry   . Other dysphagia   . Other malaise and fatigue   .  Other nonspecific abnormal serum enzyme levels   . Other specified cardiac dysrhythmias(427.89)   . Pacemaker   . Pain in joint, ankle and foot   . Pain in joint, hand   . Pain in joint, lower leg   . Pain in joint, pelvic region and thigh   . Pain in limb   . Plantar fascial fibromatosis   . Presence of permanent cardiac pacemaker    -[employer]  . Reflux esophagitis   . Sciatica   . Spinal stenosis, unspecified region other than cervical   . Stricture and stenosis of esophagus   . Symptomatic menopausal or female climacteric states   . Unspecified essential hypertension   . Unspecified essential hypertension   . Unspecified vitamin D deficiency     Past Surgical History:  Procedure Laterality Date  . BREAST BIOPSY Left 1990s X 2  . CARDIOVERSION N/A  04/26/2016   Procedure: CARDIOVERSION;  Surgeon: Pixie Casino, MD;  Location: [facility];  Service: Cardiovascular;  Laterality: N/A;  . CATARACT EXTRACTION W/ INTRAOCULAR LENS IMPLANT Left 08/03/1999   DR EPES   . CATARACT EXTRACTION W/ INTRAOCULAR LENS IMPLANT Right 2000   DR EPES  . [place]  . COLONOSCOPY  1988   Normal   . DENTAL SURGERY Left 08/2016  . EP IMPLANTABLE DEVICE N/A 06/21/2016   Procedure: Pacemaker Implant;  Surgeon: Evans Lance, MD;  Location: [facility];  Service: Cardiovascular;  Laterality: N/A;  . ESOPHAGOGASTRODUODENOSCOPY (EGD) WITH ESOPHAGEAL DILATION  ~ 1982   Dr. Sharlett Iles  . EXCISION OF ACTINIC KERATOSIS     DR LUPTON   . EYE SURGERY    . INSERT / REPLACE / REMOVE PACEMAKER    . JOINT REPLACEMENT    . KNEE ARTHROSCOPY Left 2003  . KNEE ARTHROSCOPY Right 06-26-13  . KNEE CLOSED REDUCTION Right 10/23/2013   Procedure: CLOSED MANIPULATION RIGHT KNEE;  Surgeon: Mcarthur Rossetti, MD;  Location: [facility];  Service: Orthopedics;  Laterality: Right;  . LASER FOR CLOUDY CAP LEFT EYE Left 03/2006   DR DIGBY  . LUMBAR LAMINECTOMY/DECOMPRESSION MICRODISCECTOMY N/A 06/12/2019   Procedure: Laminectomy and Foraminotomy - Lumbar four-Lumbar five;  Surgeon: Eustace Moore, MD;  Location: [facility];  Service: Neurosurgery;  Laterality: N/A;  . [facility], Dr.Gray. Removed area on upper chest  . TOTAL KNEE ARTHROPLASTY Left 04/2004   DR RENDALL  . TOTAL KNEE ARTHROPLASTY Right 07/04/2013   Procedure: RIGHT TOTAL KNEE ARTHROPLASTY;  Surgeon: Mcarthur Rossetti, MD;  Location: [facility];  Service: Orthopedics;  Laterality: Right;  . TRIGGER FINGER RELEASE Right 09/2018  . VAGINAL HYSTERECTOMY  1979     Current Outpatient Medications  Medication Sig Dispense Refill  . acetaminophen (TYLENOL) 500 MG tablet Take 500-2,000 mg by mouth daily.    Marland Kitchen alendronate (FOSAMAX) 70 MG tablet TAKE 1 TABLET BY MOUTH  EVERY 7 (SEVEN) DAYS. TAKE WITH A FULL GLASS OF WATER ON AN EMPTY STOMACH. 12 tablet 3  . antiseptic oral rinse (BIOTENE) LIQD 15 mLs by Mouth Rinse route daily.    . Artificial Saliva (ACT DRY MOUTH) LOZG Use as directed 1 drop in the mouth or throat as needed (dry mouth).    . bimatoprost (LUMIGAN) 0.01 % SOLN Place 1 drop into both eyes at bedtime.     . Calcium Carb-Cholecalciferol (CALTRATE 600+D3) 600-800 MG-UNIT TABS 1 tablet twice daily 60 tablet 0  .  Cholecalciferol (VITAMIN D) 2000 UNITS tablet Take 2,000 Units by mouth daily.    . dorzolamide (TRUSOPT) 2 % ophthalmic solution Place 1 drop into both eyes 3 (three) times daily.    Marland Kitchen ELIQUIS 5 MG TABS tablet TAKE 1 TABLET BY MOUTH TWICE A DAY 180 tablet 1  . esomeprazole (NEXIUM) 20 MG capsule Take 1 capsule (20 mg total) by mouth as needed. 90 capsule 1  . ezetimibe (ZETIA) 10 MG tablet TAKE 1 TABLET ONCE DAILY. 90 tablet 1  . fluticasone (FLONASE) 50 MCG/ACT nasal spray INHALE 1 SPRAY INTO BOTH NOSTRILS DAILY 16 mL 6  . methylcellulose (ARTIFICIAL TEARS) 1 % ophthalmic solution Place 1 drop into both eyes daily as needed (for dry eyes).     . metoprolol tartrate (LOPRESSOR) 25 MG tablet Take 12.5 mg by mouth 2 (two) times daily.    . propafenone (RYTHMOL SR) 225 MG 12 hr capsule TAKE 1 CAPSULE BY MOUTH TWICE A DAY 60 capsule 8  . traZODone (DESYREL) 50 MG tablet TAKE 1 TABLET BY MOUTH EVERYDAY AT BEDTIME 30 tablet 3  . trolamine salicylate (ASPERCREME) 10 % cream Apply 1 application topically as needed for muscle pain (for knee pain).    . valsartan-hydrochlorothiazide (DIOVAN-HCT) 320-25 MG tablet TAKE 1 TABLET ONCE DAILY. 90 tablet 1   No current facility-administered medications for this visit.    Allergies:   Advil [ibuprofen], Aleve [naproxen sodium], Aspirin, Atorvastatin, Celebrex [celecoxib], Diclofenac, Doxycycline, Fosamax [alendronate sodium], Metoclopramide hcl, Nsaids, Other, Pravastatin, and Timolol    Social History:   The patient  reports that she has never smoked. She has never used smokeless tobacco. She reports that she does not drink alcohol and does not use drugs.   Family History:  The patient's family history includes Cerebrovascular Accident in her mother; Heart disease in her father; Hypertension in her brother; Obesity in her daughter; Ovarian cancer in her mother; Uterine cancer in her mother.    ROS:  Please see the history of present illness.   Otherwise, review of systems are positive for none.   All other systems are reviewed and negative.    PHYSICAL EXAM: VS:  BP 118/80 (BP Location: Left Arm, Patient Position: Sitting, Cuff Size: Normal)   Pulse 82   Ht 5\' 3"  (1.6 m)   Wt 185 lb 9.6 oz (84.2 kg)   BMI 32.88 kg/m  , BMI Body mass index is 32.88 kg/m. GENERAL:  Well appearing, obese WF in NAD HEENT:  PERRL, EOMI, sclera are clear. Oropharynx is clear. NECK:  No jugular venous distention, carotid upstroke brisk and symmetric, no bruits, no thyromegaly or adenopathy LUNGS:  clear  CHEST:  Unremarkable HEART:  RRR,  PMI not displaced or sustained,S1 and S2 within normal limits, no S3, no S4: no clicks, no rubs, no murmurs ABD:  Soft, nontender. BS +, no masses or bruits. No hepatomegaly, no splenomegaly EXT:  2 + pulses throughout, no edema, no cyanosis no clubbing SKIN:  Warm and dry.  No rashes NEURO:  Alert and oriented x 3. Cranial nerves II through XII intact. PSYCH:  Cognitively intact    EKG:  EKG is not ordered today.    Recent Labs: 09/17/2020: BNP 59.0 01/17/2021: TSH 3.10 04/11/2021: ALT 17; BUN 14; Creat 0.65; Hemoglobin 12.6; Platelets 197; Potassium 3.9; Sodium 140    Lipid Panel    Component Value Date/Time   CHOL 143 04/11/2021 0958   CHOL 179 03/27/2016 0853   TRIG 179 (H) 04/11/2021 [id number]  HDL 37 (L) 04/11/2021 0958   HDL 42 03/27/2016 0853   CHOLHDL 3.9 04/11/2021 0958   VLDL 42 (H) 08/06/2017 0918   LDLCALC 78 04/11/2021 0958      Wt Readings from  Last 3 Encounters:  04/25/21 185 lb 9.6 oz (84.2 kg)  04/04/21 186 lb (84.4 kg)  12/28/20 186 lb 9.6 oz (84.6 kg)      Other studies Reviewed: Additional studies/ records that were reviewed today include:  Echo: 09/06/17: Study Conclusions  - Left ventricle: The cavity size was normal. Wall thickness was   increased in a pattern of mild LVH. Systolic function was normal.   The estimated ejection fraction was in the range of 55% to 60%.   Doppler parameters are consistent with both elevated ventricular   end-diastolic filling pressure and elevated left atrial filling   pressure. - Left atrium: The atrium was moderately dilated. - Atrial septum: There was increased thickness of the septum,   consistent with lipomatous hypertrophy. No defect or patent   foramen ovale was identified  Echo 10/7//21: IMPRESSIONS    1. Left ventricular ejection fraction, by estimation, is 55 to 60%. The  left ventricle has normal function. The left ventricle has no regional  wall motion abnormalities. There is mild left ventricular hypertrophy.  Left ventricular diastolic parameters  are consistent with Grade I diastolic dysfunction (impaired relaxation).  The E/e' is 32.  2. Right ventricular systolic function is normal. The right ventricular  size is normal.  3. Left atrial size was moderately dilated.  4. The mitral valve is grossly normal. No evidence of mitral valve  regurgitation.  5. The aortic valve is tricuspid. There is mild calcification of the  aortic valve. Aortic valve regurgitation is not visualized.  6. The inferior vena cava is normal in size with greater than 50%  respiratory variability, suggesting right atrial pressure of 3 mmHg.   Comparison(s): No significant change from prior study.   ASSESSMENT AND PLAN:  1.   Atrial fibrillation/flutter with RVR.  Intolerant of flecainide.  Now s/p PPM placement for marked post conversion pauses. On propafenone with marked  improvement in Afib burden. Followed by Dr Lovena Le. Pacer check March 29 was normal.  Will continue same. Continue Eliquis for anticoagulation.   2. HTN well controlled.  3. Mild hypercholesterolemia.   4. Shortness of breath. Etiology unclear.   Labs looked very good other than mildly elevated TSH. This will be followed up with primary care. BNP normal. CXR no acute findings. Echo essentially normal. Will continue to work on conditioning and weight control.    Signed, Jennelle Pinkstaff Martin[place], [place]  04/25/2021 10:19 AM    [facility] [address] [place], [place], [address] Phone [phone], Fax [phone]

## 2021-04-25 ENCOUNTER — Other Ambulatory Visit: Payer: Self-pay

## 2021-04-25 ENCOUNTER — Encounter: Payer: Self-pay | Admitting: Cardiology

## 2021-04-25 ENCOUNTER — Ambulatory Visit: Payer: Medicare Other | Admitting: Cardiology

## 2021-04-25 VITALS — BP 118/80 | HR 82 | Ht 63.0 in | Wt 185.6 lb

## 2021-04-25 DIAGNOSIS — I480 Paroxysmal atrial fibrillation: Secondary | ICD-10-CM

## 2021-04-25 DIAGNOSIS — I495 Sick sinus syndrome: Secondary | ICD-10-CM

## 2021-04-25 DIAGNOSIS — Z950 Presence of cardiac pacemaker: Secondary | ICD-10-CM

## 2021-04-25 DIAGNOSIS — I10 Essential (primary) hypertension: Secondary | ICD-10-CM

## 2021-04-25 DIAGNOSIS — R0602 Shortness of breath: Secondary | ICD-10-CM

## 2021-05-02 ENCOUNTER — Telehealth: Payer: Self-pay

## 2021-05-02 NOTE — Telephone Encounter (Signed)
Patient reports that around 10am this morning, she felt some buzzing near her pacemaker.  It occurred again lower in her heart, when she went to PT and has continued to occur every few minutes.  The sensation does not hurt.  She denies any chest pain, SOB or lightheadedness.    Assisted patient with sending a manual transmission.  Transmission reviewed, normal device function.  Presenting rate A and V paced  68bpm.  No logged arrythmias.  Nothing in report to explain sensation patient is having.    Patient will continue to monitor and if sensation continues to occur will contact Dr. Martinique (primary cardiology)

## 2021-05-05 NOTE — Telephone Encounter (Signed)
I really have no idea what this sensation is. If pacer is OK then the most likely thing would be some sort of muscle spasm. Doesn't sound cardiac. Unless she has other symptoms I wouldn't be concerned.  Ellia Knowlton Martinique MD, [facility]

## 2021-06-20 ENCOUNTER — Other Ambulatory Visit: Payer: Self-pay

## 2021-06-20 ENCOUNTER — Ambulatory Visit
Admission: RE | Admit: 2021-06-20 | Discharge: 2021-06-20 | Disposition: A | Discharge status: Home / Self Care | Payer: Medicare Other | Source: Ambulatory Visit | Attending: Nurse Practitioner | Admitting: Nurse Practitioner

## 2021-06-20 DIAGNOSIS — E2839 Other primary ovarian failure: Secondary | ICD-10-CM

## 2021-06-21 ENCOUNTER — Ambulatory Visit (INDEPENDENT_AMBULATORY_CARE_PROVIDER_SITE_OTHER): Payer: Medicare Other

## 2021-06-21 DIAGNOSIS — I480 Paroxysmal atrial fibrillation: Secondary | ICD-10-CM

## 2021-06-21 LAB — CUP PACEART REMOTE DEVICE CHECK
Battery Remaining Longevity: 62 mo
Battery Remaining Percentage: 54 %
Battery Voltage: 2.98 V
Brady Statistic AP VP Percent: 15 %
Brady Statistic AP VS Percent: 84 %
Brady Statistic AS VP Percent: 1.1 %
Brady Statistic AS VS Percent: 1 %
Brady Statistic RA Percent Paced: 99 %
Brady Statistic RV Percent Paced: 16 %
Date Time Interrogation Session: 20220628020012
Implantable Lead Implant Date: 20170628
Implantable Lead Implant Date: 20170628
Implantable Lead Location: 753859
Implantable Lead Location: 753860
Implantable Pulse Generator Implant Date: 20170628
Lead Channel Impedance Value: 490 Ohm
Lead Channel Impedance Value: 590 Ohm
Lead Channel Pacing Threshold Amplitude: 0.75 V
Lead Channel Pacing Threshold Amplitude: 1.25 V
Lead Channel Pacing Threshold Pulse Width: 0.4 ms
Lead Channel Pacing Threshold Pulse Width: 0.4 ms
Lead Channel Sensing Intrinsic Amplitude: 1.5 mV
Lead Channel Sensing Intrinsic Amplitude: 8.7 mV
Lead Channel Setting Pacing Amplitude: 2 V
Lead Channel Setting Pacing Amplitude: 2.5 V
Lead Channel Setting Pacing Pulse Width: 0.4 ms
Lead Channel Setting Sensing Sensitivity: 2 mV
Pulse Gen Model: 2272
Pulse Gen Serial Number: 7910313

## 2021-06-24 ENCOUNTER — Encounter: Payer: Self-pay | Admitting: Nurse Practitioner

## 2021-07-08 ENCOUNTER — Ambulatory Visit: Payer: Medicare Other | Admitting: *Deleted

## 2021-07-08 ENCOUNTER — Other Ambulatory Visit: Payer: Self-pay

## 2021-07-08 DIAGNOSIS — M810 Age-related osteoporosis without current pathological fracture: Secondary | ICD-10-CM | POA: Diagnosis not present

## 2021-07-08 MED ORDER — DENOSUMAB 60 MG/ML ~~LOC~~ SOSY
60.0000 mg | PREFILLED_SYRINGE | Freq: Once | SUBCUTANEOUS | Status: AC
  Administered 2021-07-08: 60 mg via SUBCUTANEOUS

## 2021-07-11 NOTE — Progress Notes (Signed)
Remote pacemaker transmission.   

## 2021-07-12 ENCOUNTER — Encounter: Payer: Self-pay | Admitting: Family

## 2021-07-12 ENCOUNTER — Other Ambulatory Visit: Payer: Self-pay

## 2021-07-12 ENCOUNTER — Telehealth (INDEPENDENT_AMBULATORY_CARE_PROVIDER_SITE_OTHER): Payer: Medicare Other | Admitting: Family

## 2021-07-12 DIAGNOSIS — Z20822 Contact with and (suspected) exposure to covid-19: Secondary | ICD-10-CM

## 2021-07-12 DIAGNOSIS — U071 COVID-19: Secondary | ICD-10-CM

## 2021-07-12 MED ORDER — ZINC GLUCONATE 50 MG PO TABS: 50.0000 mg | ORAL_TABLET | Freq: Every day | ORAL | 0 refills | Status: AC

## 2021-07-12 MED ORDER — VITAMIN C 500 MG PO TABS: 250.0000 mg | ORAL_TABLET | Freq: Two times a day (BID) | ORAL | 0 refills | Status: AC

## 2021-07-12 NOTE — Progress Notes (Signed)
This service is provided via telemedicine  No vital signs collected/recorded due to the encounter was a telemedicine visit.   Location of patient (ex: home, work):  Home  Patient consents to a telephone visit:  Yes  Location of the provider (ex: office, home):  [employer].   Name of any referring provider:  Lauree Chandler, NP   Names of all persons participating in the telemedicine service and their role in the encounter:  Patient, [name], [place], [place], Webb Silversmith, NP.    Time spent on call:  8 minutes spent on the phone with Medical Assistant.     Provider: Marieliz Strang FNP-C  Lauree Chandler, NP  Patient Care Team: Lauree Chandler, NP as PCP - General (Geriatric Medicine) Martinique, Peter M, MD as PCP - Cardiology (Cardiology) Mcarthur Rossetti, MD as Attending Physician (Orthopedic Surgery) Larey Dresser, MD as Attending Physician (Cardiology) Thornell Sartorius, MD as Consulting Physician (Otolaryngology) Druscilla Brownie, MD as Consulting Physician (Dermatology) Calvert Cantor, MD as Consulting Physician (Ophthalmology) Debara Pickett Nadean Corwin, MD as Consulting Physician (Cardiology) Eustace Moore, MD as Consulting Physician (Neurosurgery)  Extended Emergency Contact Information Primary Emergency Contact: [name],Veronica Address: [address]          [place], [place] [address] [name] of [place] Phone: [phone] Mobile Phone: [phone] Relation: Spouse Secondary Emergency Contact: [place] of [place] Mobile Phone: [phone] Relation: Friend  Code Status:  DNR Goals of care: Advanced Directive information Advanced Directives 07/12/2021  Does Patient Have a Medical Advance Directive? Yes  Type of Paramedic of [place];Living will;Out of facility DNR (pink MOST or yellow form)  Does patient want to make changes to medical advance directive? No - Patient declined  Copy of  [place] in Chart? Yes - validated most recent copy scanned in chart (See row information)  Pre-existing out of facility DNR order (yellow form or pink MOST form) -     Chief Complaint  Patient presents with   Acute Visit    Patient complains of positive covid home test.    HPI:  Pt is a 87 y.o. female seen today for an acute visit for evaluation of COVID-19 test positive yesterday morning 07/11/2021.Stays home most of the times but went to church on Sunday.No facial mask. Has pounding headache.Arms and shoulders are achy.Had nausea last night but did not vomited.sore throat with a little cough.Had pressure at the center of the chest yesterday but none today No appetite but drinks Ginger ale. Has had no fever, chills ,shortness of breath,wheezing ,chest pain,palpitation    Past Medical History:  Diagnosis Date   Allergic rhinitis due to pollen    Anxiety state, unspecified    Sitautional- pain   Arthritis    "right knee" (06/21/2016)   Asymptomatic varicose veins    Carpal tunnel syndrome    Chest pain, unspecified    Chronic lower back pain    "on the left side" (06/21/2016)   Constipation    Cramp of limb    Depressive disorder, not elsewhere classified    Diaphragmatic hernia without mention of obstruction or gangrene    Diaphragmatic hernia without mention of obstruction or gangrene    Disturbance of skin sensation    Diverticulosis of colon (without mention of hemorrhage)    Dysrhythmia    Atrial Flutter   Enthesopathy of hip region    Esophageal reflux    , just occasional  History of blood transfusion    "w/both knee replacements"   History of duodenal ulcer    History of kidney stones    passed   Insomnia, unspecified    Lumbago    Migraine    "none since ~ 1990" (06/21/2016)   Myalgia and myositis, unspecified    Obesity, unspecified    Other abnormal blood chemistry    Other dysphagia    Other malaise and fatigue    Other nonspecific  abnormal serum enzyme levels    Other specified cardiac dysrhythmias(427.89)    Pacemaker    Pain in joint, ankle and foot    Pain in joint, hand    Pain in joint, lower leg    Pain in joint, pelvic region and thigh    Pain in limb    Plantar fascial fibromatosis    Presence of permanent cardiac pacemaker    -St Jude   Reflux esophagitis    Sciatica    Spinal stenosis, unspecified region other than cervical    Stricture and stenosis of esophagus    Symptomatic menopausal or female climacteric states    Unspecified essential hypertension    Unspecified essential hypertension    Unspecified vitamin D deficiency    Past Surgical History:  Procedure Laterality Date   BREAST BIOPSY Left 1990s X 2   CARDIOVERSION N/A 04/26/2016   Procedure: CARDIOVERSION;  Surgeon: Pixie Casino, MD;  Location: [facility];  Service: Cardiovascular;  Laterality: N/A;   CATARACT EXTRACTION W/ INTRAOCULAR LENS IMPLANT Left 08/03/1999   DR EPES    CATARACT EXTRACTION W/ INTRAOCULAR LENS IMPLANT Right 2000   DR EPES   CHOLECYSTECTOMY OPEN  1989   DR BOWMAN   COLONOSCOPY  1988   Normal    DENTAL SURGERY Left 08/2016   EP IMPLANTABLE DEVICE N/A 06/21/2016   Procedure: Pacemaker Implant;  Surgeon: Evans Lance, MD;  Location: [facility];  Service: Cardiovascular;  Laterality: N/A;   ESOPHAGOGASTRODUODENOSCOPY (EGD) WITH ESOPHAGEAL DILATION  ~ 1982   Dr. Sharlett Iles   EXCISION OF ACTINIC KERATOSIS     DR LUPTON    EYE SURGERY     INSERT / REPLACE / REMOVE PACEMAKER     JOINT REPLACEMENT     KNEE ARTHROSCOPY Left 2003   KNEE ARTHROSCOPY Right 06-26-13   KNEE CLOSED REDUCTION Right 10/23/2013   Procedure: CLOSED MANIPULATION RIGHT KNEE;  Surgeon: Mcarthur Rossetti, MD;  Location: [facility];  Service: Orthopedics;  Laterality: Right;   LASER FOR CLOUDY CAP LEFT EYE Left 03/2006   DR DIGBY   LUMBAR LAMINECTOMY/DECOMPRESSION MICRODISCECTOMY N/A 06/12/2019   Procedure: Laminectomy and Foraminotomy  - Lumbar four-Lumbar five;  Surgeon: Eustace Moore, MD;  Location: [facility];  Service: Neurosurgery;  Laterality: N/A;   [facility], Dr.Gray. Removed area on upper chest   TOTAL KNEE ARTHROPLASTY Left 04/2004   DR RENDALL   TOTAL KNEE ARTHROPLASTY Right 07/04/2013   Procedure: RIGHT TOTAL KNEE ARTHROPLASTY;  Surgeon: Mcarthur Rossetti, MD;  Location: [facility];  Service: Orthopedics;  Laterality: Right;   TRIGGER FINGER RELEASE Right 09/2018   VAGINAL HYSTERECTOMY  1979    Allergies  Allergen Reactions   Advil [Ibuprofen]     GI upset   Aleve [Naproxen Sodium]     GI upset   Aspirin Nausea And Vomiting    Takes baby aspirin qod without problems   Atorvastatin     Leg cramps  Celebrex [Celecoxib]     GI upset   Diclofenac     GI upset   Doxycycline     Headache   Fosamax [Alendronate Sodium]     GI discomfort    Metoclopramide Hcl     REACTION: heart palps   Nsaids Other (See Comments)    Tears my stomach up    Other     Antihistamine - increase BP   Pravastatin     Leg cramps   Timolol     Dropped HR    Outpatient Encounter Medications as of 07/12/2021  Medication Sig   acetaminophen (TYLENOL) 500 MG tablet Take 500-2,000 mg by mouth daily.   alendronate (FOSAMAX) 70 MG tablet TAKE 1 TABLET BY MOUTH EVERY 7 (SEVEN) DAYS. TAKE WITH A FULL GLASS OF WATER ON AN EMPTY STOMACH.   antiseptic oral rinse (BIOTENE) LIQD 15 mLs by Mouth Rinse route daily.   Artificial Saliva (ACT DRY MOUTH) LOZG Use as directed 1 drop in the mouth or throat as needed (dry mouth).   bimatoprost (LUMIGAN) 0.01 % SOLN Place 1 drop into both eyes at bedtime.    Calcium Carb-Cholecalciferol (CALTRATE 600+D3) 600-800 MG-UNIT TABS 1 tablet twice daily   Cholecalciferol (VITAMIN D) 2000 UNITS tablet Take 2,000 Units by mouth daily.   dorzolamide (TRUSOPT) 2 % ophthalmic solution Place 1 drop into both eyes 3 (three) times daily.   ELIQUIS 5 MG TABS tablet TAKE 1 TABLET BY  MOUTH TWICE A DAY   esomeprazole (NEXIUM) 20 MG capsule Take 1 capsule (20 mg total) by mouth as needed.   ezetimibe (ZETIA) 10 MG tablet TAKE 1 TABLET ONCE DAILY.   fluticasone (FLONASE) 50 MCG/ACT nasal spray INHALE 1 SPRAY INTO BOTH NOSTRILS DAILY   methylcellulose (ARTIFICIAL TEARS) 1 % ophthalmic solution Place 1 drop into both eyes daily as needed (for dry eyes).    metoprolol tartrate (LOPRESSOR) 25 MG tablet Take 12.5 mg by mouth 2 (two) times daily.   propafenone (RYTHMOL SR) 225 MG 12 hr capsule TAKE 1 CAPSULE BY MOUTH TWICE A DAY   traZODone (DESYREL) 50 MG tablet TAKE 1 TABLET BY MOUTH EVERYDAY AT BEDTIME   trolamine salicylate (ASPERCREME) 10 % cream Apply 1 application topically as needed for muscle pain (for knee pain).   valsartan-hydrochlorothiazide (DIOVAN-HCT) 320-25 MG tablet TAKE 1 TABLET ONCE DAILY.   No facility-administered encounter medications on file as of 07/12/2021.    Review of Systems  Constitutional:  Positive for appetite change. Negative for chills, fatigue, fever and unexpected weight change.       Body aches   HENT:  Positive for sore throat. Negative for congestion, dental problem, ear discharge, ear pain, facial swelling, hearing loss, nosebleeds, postnasal drip, rhinorrhea, sinus pressure, sinus pain, sneezing, tinnitus and trouble swallowing.   Eyes:  Negative for pain, discharge, redness, itching and visual disturbance.  Respiratory:  Positive for cough. Negative for chest tightness, shortness of breath and wheezing.   Cardiovascular:  Negative for chest pain, palpitations and leg swelling.  Gastrointestinal:  Negative for abdominal distention, abdominal pain, blood in stool, constipation, diarrhea, nausea and vomiting.  Endocrine: Negative for cold intolerance, heat intolerance, polydipsia, polyphagia and polyuria.  Genitourinary:  Negative for difficulty urinating, dysuria, flank pain, frequency and urgency.  Musculoskeletal:  Negative for  arthralgias, back pain, gait problem, joint swelling, myalgias, neck pain and neck stiffness.  Skin:  Negative for color change, pallor, rash and wound.  Neurological:  Positive for headaches. Negative for dizziness,  syncope, speech difficulty, weakness, light-headedness and numbness.  Hematological:  Does not bruise/bleed easily.  Psychiatric/Behavioral:  Negative for agitation, behavioral problems, confusion, hallucinations, self-injury, sleep disturbance and suicidal ideas. The patient is not nervous/anxious.    Immunization History  Administered Date(s) Administered   Fluad Quad(high Dose 65+) 09/29/2019, 10/01/2020   Influenza, High Dose Seasonal PF 09/14/2017, 10/02/2018   Influenza,inj,Quad PF,6+ Mos 09/23/2013, 09/25/2014, 10/11/2015, 08/30/2016   PFIZER(Purple Top)SARS-COV-2 Vaccination 01/16/2020, 02/06/2020, 09/29/2020   Pneumococcal Conjugate-13 02/17/2015   Pneumococcal Polysaccharide-23 12/12/2016   Tdap 06/24/2017   Zoster Recombinat (Shingrix) 09/04/2017, 02/21/2018   Zoster, Live 01/29/2008   Pertinent  Health Maintenance Due  Topic Date Due   INFLUENZA VACCINE  07/25/2021   MAMMOGRAM  10/05/2021   DEXA SCAN  Completed   PNA vac Low Risk Adult  Completed   Fall Risk  07/12/2021 04/04/2021 01/24/2021 12/28/2020 10/01/2020  Falls in the past year? 0 1 1 1  0  Number falls in past yr: 0 0 0 0 0  Comment - - - - -  Injury with Fall? 0 1 0 1 0  Comment - - - Injured back of head -  Risk Factor Category  - - - - -  Risk for fall due to : No Fall Risks History of fall(s) History of fall(s) History of fall(s) -  Follow up Falls evaluation completed - - - -  Comment - - - - -   Functional Status Survey:    There were no vitals filed for this visit. There is no height or weight on file to calculate BMI. Physical Exam Vitals reviewed.  Constitutional:      General: She is not in acute distress.    Appearance: She is not ill-appearing.  HENT:     Head: Normocephalic.   Pulmonary:     Effort: Pulmonary effort is normal. No respiratory distress.  Neurological:     Mental Status: She is alert and oriented to person, place, and time.  Psychiatric:        Mood and Affect: Mood normal.        Behavior: Behavior normal.        Thought Content: Thought content normal.        Judgment: Judgment normal.    Labs reviewed: Recent Labs    09/17/20 0811 04/11/21 0958  NA 140 140  K 4.0 3.9  CL 101 104  CO2 26 30  GLUCOSE 92 98  BUN 18 14  CREATININE 0.81 0.65  CALCIUM 9.5 8.8   Recent Labs    04/11/21 0958  AST 18  ALT 17  BILITOT 0.5  PROT 6.4   Recent Labs    09/17/20 0811 04/11/21 0958  WBC 5.0 4.6  NEUTROABS 2.2 2,024  HGB 13.1 12.6  HCT 38.5 38.8  MCV 93 93.0  PLT 180 197   Lab Results  Component Value Date   TSH 3.10 01/17/2021   Lab Results  Component Value Date   HGBA1C 5.2 08/06/2017   Lab Results  Component Value Date   CHOL 143 04/11/2021   HDL 37 (L) 04/11/2021   LDLCALC 78 04/11/2021   TRIG 179 (H) 04/11/2021   CHOLHDL 3.9 04/11/2021    Significant Diagnostic Results in last 30 days:  DG Bone Density  Result Date: 06/20/2021 EXAM: DUAL X-RAY ABSORPTIOMETRY (DXA) FOR BONE MINERAL DENSITY IMPRESSION: Referring Physician:  Lauree Chandler Your patient completed a bone mineral density test using GE Lunar iDXA system (analysis version: 16). Technologist:  SEC PATIENT: Name: [name], [name] Patient ID: [id number] Birth Date: [birth year] Height: 61.0 in. Sex: Female Measured: 06/20/2021 Weight: 183.8 lbs. Indications: Advanced Age, Caucasian, Estrogen Deficient, Family Hist. (Parent hip fracture), Height Loss (781.91), Hysterectomy, Postmenopausal Fractures: Foot Treatments: Calcium (E943.0), Fosamax, Vitamin D (E933.5) ASSESSMENT: The BMD measured at Forearm Radius 33% is 0.631 g/cm2 with a T-score of -2.9. This patient is considered osteoporotic according to [facility]) criteria. The quality of the exam  is good. The lumbar spine was excluded due to degenerative changes, and as it was excluded from the prior DXA scan. Site Region Measured Date Measured Age YA BMD Significant CHANGE T-score Left Forearm Radius 33% 06/20/2021 88.1 -2.11 0.631 g/cm2 Left Forearm Radius 33% 01/03/2019 85.6 -2.7 0.650 g/cm2 DualFemur Total Left 06/20/2021 88.1 -2.7 0.669 g/cm2 * DualFemur Total Left 01/03/2019 85.6 -2.3 0.713 g/cm2 DualFemur Total Mean 06/20/2021 88.1 -2.3 0.717 g/cm2 DualFemur Total Mean 01/03/2019 85.6 -2.1 0.738 g/cm2 World Health [facility]) criteria for post-menopausal, Caucasian Women: Normal       T-score at or above -1 SD Osteopenia   T-score between -1 and -2.5 SD Osteoporosis T-score at or below -2.5 SD RECOMMENDATION: 1. All patients should optimize calcium and vitamin D intake. 2. Consider FDA-approved medical therapies in postmenopausal women and men aged 8 years and older, based on the following: a. A hip or vertebral (clinical or morphometric) fracture. b. T-score = -2.5 at the femoral neck or spine after appropriate evaluation to exclude secondary causes. c. Low bone mass (T-score between -1.0 and -2.5 at the femoral neck or spine) and a 10-year probability of a hip fracture = 3% or a 10-year probability of a major osteoporosis-related fracture = 20% based on the [place]-adapted WHO algorithm. d. Clinician judgment and/or patient preferences may indicate treatment for people with 10-year fracture probabilities above or below these levels. FOLLOW-UP: Patients with diagnosis of osteoporosis or at high risk for fracture should have regular bone mineral density tests.? Patients eligible for Medicare are allowed routine testing every 2 years.? The testing frequency can be increased to one year for patients who have rapidly progressing disease, are receiving or discontinuing medical therapy to restore bone mass, or have additional risk factors. I have reviewed this study and agree with the findings.  [facility] Electronically Signed   By: Lowella Grip III M.D.   On: 06/20/2021 16:11   CUP PACEART REMOTE DEVICE CHECK  Result Date: 06/21/2021 Scheduled remote reviewed. Normal device function.  Next remote 91 days- JBox, RN/CVRS   Assessment/Plan  Close exposure to COVID-19 virus Home COVID-19 Test positive.  - Continue on Vitamin D 2000 units daily  - start on vitamin C 500 mg tablet one by mouth twice daily x 14 days  - Zinc 50 mg tablet one by mouth daily x 14 days  - Tylenol as needed for fever or body aches  - over the counter Mucinex as needed for cough  - increase your fruits intake in your diet  - Stay hydrated  - Self Quarantine for 5 days,social distance,wear facial mask,hand hygiene and cleaning counter surface per CDC guidelines. - vitamin C (ASCORBIC ACID) 500 MG tablet; Take 0.5 tablets (250 mg total) by mouth 2 (two) times daily for 14 days.  Dispense: 14 tablet; Refill: 0 - zinc gluconate 50 MG tablet; Take 1 tablet (50 mg total) by mouth daily for 14 days.  Dispense: 14 tablet; Refill: 0 - advised to Notify provider if symptoms worsen or fail  to improve  - Will order Paxlovid GFR 80 if needed.currently afebrile.   Family/ staff Communication: Reviewed plan of care with patient verbalized understanding   Labs/tests ordered: None   Next Appointment: As needed if symptoms worsen or fail to improve  I connected with  [name] on 07/12/21 by a video enabled telemedicine application and verified that I am speaking with the correct person using two identifiers.   I discussed the limitations of evaluation and management by telemedicine. The patient expressed understanding and agreed to proceed.  Spent 15 minutes of face to face on MyChart video with patient    Sandrea Hughs, NP

## 2021-07-12 NOTE — Patient Instructions (Signed)
-   Continue on Vitamin D 2000 units daily  - start on vitamin C 500 mg tablet one by mouth twice daily x 14 days  - Zinc 50 mg tablet one by mouth daily x 14 days  - Tylenol as needed for fever or body aches  - over the counter Mucinex as needed for cough  - increase your fruits intake in your diet  - Stay hydrated  - Self Quarantine for 5 days,social distance,wear facial mask,hand hygiene and cleaning counter surface per CDC guidelines. - Notify provider if symptoms worsen or fail to improve

## 2021-07-13 ENCOUNTER — Other Ambulatory Visit: Payer: Self-pay | Admitting: Family

## 2021-07-13 DIAGNOSIS — Z20822 Contact with and (suspected) exposure to covid-19: Secondary | ICD-10-CM

## 2021-07-13 DIAGNOSIS — U071 COVID-19: Secondary | ICD-10-CM

## 2021-07-13 DIAGNOSIS — A0839 Other viral enteritis: Secondary | ICD-10-CM

## 2021-07-13 MED ORDER — LOPERAMIDE HCL 2 MG PO TABS: 2.0000 mg | ORAL_TABLET | Freq: Four times a day (QID) | ORAL | 0 refills | Status: DC | PRN

## 2021-07-14 ENCOUNTER — Telehealth: Payer: Self-pay | Admitting: Family

## 2021-07-14 NOTE — Telephone Encounter (Signed)
Late Entry 07/13/2021: Patient's Husband send message via his Mychart stating patient was not feeling well,fatigue,chills,fever,and diarrhea from COVID-19 infection.requested antiviral medication.Patient's chart reviewed GFR 80 but current on Eliquis 5 mg tablet twice daily which can be reduced in half but also on propafenone 225 mg 12 hr capsule which interacts with antiviral.spoke with Dr.Gupta who verified with Pharmacist states antiviral not recommended due to her Afib medication.Monoclonal antibodies infusion recommended.Advised patient to go to ED for infusion stated was feeling better this afternoon symptoms were bad in the morning would like to wait until the following day.

## 2021-09-15 ENCOUNTER — Encounter: Payer: Self-pay | Admitting: Physician Assistant

## 2021-09-15 ENCOUNTER — Ambulatory Visit: Payer: Medicare Other | Admitting: Physician Assistant

## 2021-09-15 ENCOUNTER — Ambulatory Visit: Payer: Self-pay

## 2021-09-15 DIAGNOSIS — M25561 Pain in right knee: Secondary | ICD-10-CM

## 2021-09-15 NOTE — Progress Notes (Signed)
Office Visit Note   Patient: [name]           Date of Birth: 1931-11-28           MRN: [id number] Visit Date: 09/15/2021              Requested by: Lauree Chandler, NP [place],  [place] [id number] PCP: Lauree Chandler, NP   Assessment & Plan: Visit Diagnoses:  1. Acute pain of right knee     Plan: We will have her work on Forensic scientist.  Reassurance was given that clinically and radiographically the right total knee arthroplasty components in overall knee stable and without acute injury.  She is given a prescription for physical therapy to work on strengthening the right knee at [facility] physical therapy.  Follow-Up Instructions: Return if symptoms worsen or fail to improve.   Orders:  Orders Placed This Encounter  Procedures   XR Knee 1-2 Views Right   No orders of the defined types were placed in this encounter.     Procedures: No procedures performed   Clinical Data: No additional findings.   Subjective: Chief Complaint  Patient presents with   Right Knee - Pain    HPI Mrs. Tavella comes in today with right knee pain for 3 weeks.  She got up in the melanite to get the bathroom and caught her left foot on the Lazy-Boy in her room. She landed on the anterior medial aspect of her right knee.  She did also injure her right elbow which she has been applying Neosporin to. Review of Systems See HPI otherwise negative  Objective: Vital Signs: There were no vitals taken for this visit.  Physical Exam General: Well-developed well-nourished female no acute distress mood affect appropriate. Psych: Alert and oriented x3 Ortho Exam Right knee good range of motion.  No instability valgus varus stressing.  No abnormal warmth erythema or effusion. Right elbow full range of motion without pain.  She has a large abrasion over the olecranon region of the elbow but no signs of gross infection. Specialty Comments:  No specialty comments  available.  Imaging: XR Knee 1-2 Views Right  Result Date: 09/15/2021 Right knee 2 views: Right total knee arthroplasty components well-seated no signs of loosening.  No acute fractures no bony abnormalities.  Knee is well located.    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/23/2013   Knee joint replacement by other means 10/01/2013   Arthritis of knee, right 07/04/2013   Osteoarthritis of right knee 07/01/2013   Bradycardia 05/13/2013   HYPERCHOLESTEROLEMIA 06/02/2009   Essential hypertension 06/02/2009   ESOPHAGEAL STRICTURE 06/02/2009   GERD 06/02/2009   HIATAL HERNIA 06/02/2009   DIVERTICULOSIS, COLON 06/02/2009   Dysphagia, pharyngoesophageal phase 06/02/2009   Backache 05/01/2007   LEG PAIN, RIGHT 05/01/2007   Past Medical History:  Diagnosis Date   Allergic rhinitis due to pollen    Anxiety state, unspecified    Sitautional- pain   Arthritis    "right knee" (06/21/2016)   Asymptomatic varicose veins  Carpal tunnel syndrome    Chest pain, unspecified    Chronic lower back pain    "on the left side" (06/21/2016)   Constipation    Cramp of limb    Depressive disorder, not elsewhere classified    Diaphragmatic hernia without mention of obstruction or gangrene    Diaphragmatic hernia without mention of obstruction or gangrene    Disturbance of skin sensation    Diverticulosis of colon (without mention of hemorrhage)    Dysrhythmia    Atrial Flutter   Enthesopathy of hip region    Esophageal reflux    , just occasional   History of blood transfusion     "w/both knee replacements"   History of duodenal ulcer    History of kidney stones    passed   Insomnia, unspecified    Lumbago    Migraine    "none since ~ 1990" (06/21/2016)   Myalgia and myositis, unspecified    Obesity, unspecified    Other abnormal blood chemistry    Other dysphagia    Other malaise and fatigue    Other nonspecific abnormal serum enzyme levels    Other specified cardiac dysrhythmias(427.89)    Pacemaker    Pain in joint, ankle and foot    Pain in joint, hand    Pain in joint, lower leg    Pain in joint, pelvic region and thigh    Pain in limb    Plantar fascial fibromatosis    Presence of permanent cardiac pacemaker    -St Jude   Reflux esophagitis    Sciatica    Spinal stenosis, unspecified region other than cervical    Stricture and stenosis of esophagus    Symptomatic menopausal or female climacteric states    Unspecified essential hypertension    Unspecified essential hypertension    Unspecified vitamin D deficiency     Family History  Problem Relation Age of Onset   Ovarian cancer Mother    Uterine cancer Mother    Cerebrovascular Accident Mother    Heart disease Father    Hypertension Brother    Obesity Daughter     Past Surgical History:  Procedure Laterality Date   BREAST BIOPSY Left 1990s X 2   CARDIOVERSION N/A 04/26/2016   Procedure: CARDIOVERSION;  Surgeon: Pixie Casino, MD;  Location: [facility] ENDOSCOPY;  Service: Cardiovascular;  Laterality: N/A;   CATARACT EXTRACTION W/ INTRAOCULAR LENS IMPLANT Left 08/03/1999   DR EPES    CATARACT EXTRACTION W/ INTRAOCULAR LENS IMPLANT Right 2000   DR EPES   CHOLECYSTECTOMY OPEN  1989   DR BOWMAN   COLONOSCOPY  1988   Normal    DENTAL SURGERY Left 08/2016   EP IMPLANTABLE DEVICE N/A 06/21/2016   Procedure: Pacemaker Implant;  Surgeon: Evans Lance, MD;  Location: [place] CV LAB;  Service: Cardiovascular;  Laterality: N/A;   ESOPHAGOGASTRODUODENOSCOPY (EGD) WITH ESOPHAGEAL DILATION  ~ 1982    Dr. Sharlett Iles   EXCISION OF ACTINIC KERATOSIS     DR LUPTON    EYE SURGERY     INSERT / REPLACE / REMOVE PACEMAKER     JOINT REPLACEMENT     KNEE ARTHROSCOPY Left 2003   KNEE ARTHROSCOPY Right 06-26-13   KNEE CLOSED REDUCTION Right 10/23/2013   Procedure: CLOSED MANIPULATION RIGHT KNEE;  Surgeon: Mcarthur Rossetti, MD;  Location: [facility];  Service: Orthopedics;  Laterality: Right;   LASER FOR CLOUDY CAP LEFT EYE Left 03/2006   DR DIGBY  LUMBAR LAMINECTOMY/DECOMPRESSION MICRODISCECTOMY N/A 06/12/2019   Procedure: Laminectomy and Foraminotomy - Lumbar four-Lumbar five;  Surgeon: Eustace Moore, MD;  Location: [facility];  Service: Neurosurgery;  Laterality: N/A;   [facility], Dr.Gray. Removed area on upper chest   TOTAL KNEE ARTHROPLASTY Left 04/2004   DR RENDALL   TOTAL KNEE ARTHROPLASTY Right 07/04/2013   Procedure: RIGHT TOTAL KNEE ARTHROPLASTY;  Surgeon: Mcarthur Rossetti, MD;  Location: [facility];  Service: Orthopedics;  Laterality: Right;   TRIGGER FINGER RELEASE Right 09/2018   VAGINAL HYSTERECTOMY  1979   Social History   Occupational History   Not on file  Tobacco Use   Smoking status: Never   Smokeless tobacco: Never  Vaping Use   Vaping Use: Never used  Substance and Sexual Activity   Alcohol use: No   Drug use: No   Sexual activity: Yes

## 2021-09-20 ENCOUNTER — Ambulatory Visit (INDEPENDENT_AMBULATORY_CARE_PROVIDER_SITE_OTHER): Payer: Medicare Other

## 2021-09-20 DIAGNOSIS — I495 Sick sinus syndrome: Secondary | ICD-10-CM | POA: Diagnosis not present

## 2021-09-20 LAB — CUP PACEART REMOTE DEVICE CHECK
Battery Remaining Longevity: 59 mo
Battery Remaining Percentage: 51 %
Battery Voltage: 2.98 V
Brady Statistic AP VP Percent: 15 %
Brady Statistic AP VS Percent: 84 %
Brady Statistic AS VP Percent: 1.1 %
Brady Statistic AS VS Percent: 1 %
Brady Statistic RA Percent Paced: 99 %
Brady Statistic RV Percent Paced: 16 %
Date Time Interrogation Session: 20220927020013
Implantable Lead Implant Date: 20170628
Implantable Lead Implant Date: 20170628
Implantable Lead Location: 753859
Implantable Lead Location: 753860
Implantable Pulse Generator Implant Date: 20170628
Lead Channel Impedance Value: 490 Ohm
Lead Channel Impedance Value: 580 Ohm
Lead Channel Pacing Threshold Amplitude: 0.75 V
Lead Channel Pacing Threshold Amplitude: 1.25 V
Lead Channel Pacing Threshold Pulse Width: 0.4 ms
Lead Channel Pacing Threshold Pulse Width: 0.4 ms
Lead Channel Sensing Intrinsic Amplitude: 1.5 mV
Lead Channel Sensing Intrinsic Amplitude: 7.8 mV
Lead Channel Setting Pacing Amplitude: 2 V
Lead Channel Setting Pacing Amplitude: 2.5 V
Lead Channel Setting Pacing Pulse Width: 0.4 ms
Lead Channel Setting Sensing Sensitivity: 2 mV
Pulse Gen Model: 2272
Pulse Gen Serial Number: 7910313

## 2021-09-25 ENCOUNTER — Other Ambulatory Visit: Payer: Self-pay | Admitting: Nurse Practitioner

## 2021-09-25 DIAGNOSIS — G4700 Insomnia, unspecified: Secondary | ICD-10-CM

## 2021-09-27 NOTE — Progress Notes (Signed)
Remote pacemaker transmission.   

## 2021-09-28 ENCOUNTER — Other Ambulatory Visit: Payer: Self-pay | Admitting: Nurse Practitioner

## 2021-09-28 DIAGNOSIS — K219 Gastro-esophageal reflux disease without esophagitis: Secondary | ICD-10-CM

## 2021-09-29 ENCOUNTER — Other Ambulatory Visit: Payer: Self-pay | Admitting: Cardiology

## 2021-10-04 ENCOUNTER — Telehealth: Payer: Self-pay

## 2021-10-04 NOTE — Telephone Encounter (Signed)
The patient has a couple of medication questions. She would like for Dr. Lovena Le nurse to give her a call back.

## 2021-10-05 ENCOUNTER — Ambulatory Visit: Payer: Medicare Other | Admitting: Nurse Practitioner

## 2021-10-05 NOTE — Telephone Encounter (Signed)
Answered all questions about medications. Verbalized understanding.

## 2021-10-05 NOTE — Telephone Encounter (Signed)
Pt called to report that she is doing a med review with her insurance company and she just needed to know who her original prescriber of, of her metoprolol.... I advised her that back in 2017 it was Sharlett Iles but since then between [name] NP her Pcp and Dr. Lovena Le and Dr. Martinique all have been managing her dosing... but she will note it as her general Cardiologist Dr. Martinique. She will keep her appt with Dr. Lovena Le for her Pacemaker check 11/22/21.

## 2021-10-07 ENCOUNTER — Other Ambulatory Visit: Payer: Self-pay

## 2021-10-07 ENCOUNTER — Ambulatory Visit: Payer: Medicare Other | Admitting: Nurse Practitioner

## 2021-10-07 ENCOUNTER — Encounter: Payer: Self-pay | Admitting: Nurse Practitioner

## 2021-10-07 VITALS — BP 152/94 | HR 67 | Temp 97.9°F | Ht 63.0 in | Wt 186.4 lb

## 2021-10-07 DIAGNOSIS — I480 Paroxysmal atrial fibrillation: Secondary | ICD-10-CM

## 2021-10-07 DIAGNOSIS — M810 Age-related osteoporosis without current pathological fracture: Secondary | ICD-10-CM

## 2021-10-07 DIAGNOSIS — S50319A Abrasion of unspecified elbow, initial encounter: Secondary | ICD-10-CM

## 2021-10-07 DIAGNOSIS — M48062 Spinal stenosis, lumbar region with neurogenic claudication: Secondary | ICD-10-CM

## 2021-10-07 DIAGNOSIS — F5101 Primary insomnia: Secondary | ICD-10-CM

## 2021-10-07 DIAGNOSIS — E785 Hyperlipidemia, unspecified: Secondary | ICD-10-CM

## 2021-10-07 DIAGNOSIS — Z23 Encounter for immunization: Secondary | ICD-10-CM

## 2021-10-07 DIAGNOSIS — K219 Gastro-esophageal reflux disease without esophagitis: Secondary | ICD-10-CM

## 2021-10-07 DIAGNOSIS — I10 Essential (primary) hypertension: Secondary | ICD-10-CM

## 2021-10-07 NOTE — Progress Notes (Signed)
Careteam: Patient Care Team: Lauree Chandler, NP as PCP - General (Geriatric Medicine) Martinique, Peter M, MD as PCP - Cardiology (Cardiology) Mcarthur Rossetti, MD as Attending Physician (Orthopedic Surgery) Larey Dresser, MD as Attending Physician (Cardiology) Thornell Sartorius, MD as Consulting Physician (Otolaryngology) Druscilla Brownie, MD as Consulting Physician (Dermatology) Calvert Cantor, MD as Consulting Physician (Ophthalmology) Debara Pickett Nadean Corwin, MD as Consulting Physician (Cardiology) Eustace Moore, MD as Consulting Physician (Neurosurgery)  PLACE OF SERVICE:  [facility]  Advanced Directive information Does Patient Have a Medical Advance Directive?: Yes, Type of Advance Directive: [place];Out of facility DNR (pink MOST or yellow form), Pre-existing out of facility DNR order (yellow form or pink MOST form): Yellow form placed in chart (order not valid for inpatient use);Pink MOST form placed in chart (order not valid for inpatient use), Does patient want to make changes to medical advance directive?: No - Patient declined  Allergies  Allergen Reactions   Advil [Ibuprofen]     GI upset   Aleve [Naproxen Sodium]     GI upset   Aspirin Nausea And Vomiting    Takes baby aspirin qod without problems   Atorvastatin     Leg cramps   Celebrex [Celecoxib]     GI upset   Diclofenac     GI upset   Doxycycline     Headache   Fosamax [Alendronate Sodium]     GI discomfort    Metoclopramide Hcl     REACTION: heart palps   Nsaids Other (See Comments)    Tears my stomach up    Other     Antihistamine - increase BP   Pravastatin     Leg cramps   Timolol     Dropped HR    Chief Complaint  Patient presents with   Medical Management of Chronic Issues    6 month follow-up. Care gaps reviewed, discuss need for covid, and mammogram or postpone/exclude if patient refuses. Flu vaccine today       HPI: Patient is a 87 y.o. female for  routine follow up,  She had COVID, lost weight but gained it back.   She also had a fall and then her knee started clicking so wanted to follow up with orthopedic s/p knee replacement. She has started PT.  When she fell she fell on the knee and elbow on right side.  Elbow bruised and bleed. Orthopedics gave her   Osteoporosis- getting prolia injection every 6 months.   Htn- blood pressure elevated today, reports she is eating more salt. She is cutting back.    Afib- monitored by cardiology- continues on metoprolol and profaenone  on eliquis for anticoagulation. Rate controlled and has been in SR.   GERD- on nexium but does not use often  Hyperlipidemia- on zetia.    Review of Systems:  Review of Systems  Constitutional:  Negative for chills, fever and weight loss.  HENT:  Negative for tinnitus.   Respiratory:  Negative for cough, sputum production and shortness of breath.   Cardiovascular:  Negative for chest pain, palpitations and leg swelling.  Gastrointestinal:  Negative for abdominal pain, constipation, diarrhea and heartburn.  Genitourinary:  Negative for dysuria, frequency and urgency.  Musculoskeletal:  Negative for back pain, falls, joint pain and myalgias.  Skin: Negative.   Neurological:  Negative for dizziness and headaches.  Psychiatric/Behavioral:  Negative for depression and memory loss. The patient does not have insomnia.    Past  Medical History:  Diagnosis Date   Allergic rhinitis due to pollen    Anxiety state, unspecified    Sitautional- pain   Arthritis    "right knee" (06/21/2016)   Asymptomatic varicose veins    Carpal tunnel syndrome    Chest pain, unspecified    Chronic lower back pain    "on the left side" (06/21/2016)   Constipation    Cramp of limb    Depressive disorder, not elsewhere classified    Diaphragmatic hernia without mention of obstruction or gangrene    Diaphragmatic hernia without mention of obstruction or gangrene    Disturbance of  skin sensation    Diverticulosis of colon (without mention of hemorrhage)    Dysrhythmia    Atrial Flutter   Enthesopathy of hip region    Esophageal reflux    , just occasional   History of blood transfusion    "w/both knee replacements"   History of duodenal ulcer    History of kidney stones    passed   Insomnia, unspecified    Lumbago    Migraine    "none since ~ 1990" (06/21/2016)   Myalgia and myositis, unspecified    Obesity, unspecified    Other abnormal blood chemistry    Other dysphagia    Other malaise and fatigue    Other nonspecific abnormal serum enzyme levels    Other specified cardiac dysrhythmias(427.89)    Pacemaker    Pain in joint, ankle and foot    Pain in joint, hand    Pain in joint, lower leg    Pain in joint, pelvic region and thigh    Pain in limb    Plantar fascial fibromatosis    Presence of permanent cardiac pacemaker    -St Jude   Reflux esophagitis    Sciatica    Spinal stenosis, unspecified region other than cervical    Stricture and stenosis of esophagus    Symptomatic menopausal or female climacteric states    Unspecified essential hypertension    Unspecified essential hypertension    Unspecified vitamin D deficiency    Past Surgical History:  Procedure Laterality Date   BREAST BIOPSY Left 1990s X 2   CARDIOVERSION N/A 04/26/2016   Procedure: CARDIOVERSION;  Surgeon: Pixie Casino, MD;  Location: [place] [facility];  Service: Cardiovascular;  Laterality: N/A;   CATARACT EXTRACTION W/ INTRAOCULAR LENS IMPLANT Left 08/03/1999   DR EPES    CATARACT EXTRACTION W/ INTRAOCULAR LENS IMPLANT Right 2000   DR EPES   CHOLECYSTECTOMY OPEN  1989   DR BOWMAN   COLONOSCOPY  1988   Normal    DENTAL SURGERY Left 08/2016   EP IMPLANTABLE DEVICE N/A 06/21/2016   Procedure: Pacemaker Implant;  Surgeon: Evans Lance, MD;  Location: [place] [facility];  Service: Cardiovascular;  Laterality: N/A;   ESOPHAGOGASTRODUODENOSCOPY (EGD) WITH ESOPHAGEAL DILATION   ~ 1982   Dr. Sharlett Iles   EXCISION OF ACTINIC KERATOSIS     DR LUPTON    EYE SURGERY     INSERT / REPLACE / REMOVE PACEMAKER     JOINT REPLACEMENT     KNEE ARTHROSCOPY Left 2003   KNEE ARTHROSCOPY Right 06-26-13   KNEE CLOSED REDUCTION Right 10/23/2013   Procedure: CLOSED MANIPULATION RIGHT KNEE;  Surgeon: Mcarthur Rossetti, MD;  Location: [place] [facility];  Service: Orthopedics;  Laterality: Right;   LASER FOR CLOUDY CAP LEFT EYE Left 03/2006   DR DIGBY   LUMBAR LAMINECTOMY/DECOMPRESSION MICRODISCECTOMY N/A 06/12/2019  Procedure: Laminectomy and Foraminotomy - Lumbar four-Lumbar five;  Surgeon: Eustace Moore, MD;  Location: [place];  Service: Neurosurgery;  Laterality: N/A;   [facility], Dr.Gray. Removed area on upper chest   TOTAL KNEE ARTHROPLASTY Left 04/2004   DR RENDALL   TOTAL KNEE ARTHROPLASTY Right 07/04/2013   Procedure: RIGHT TOTAL KNEE ARTHROPLASTY;  Surgeon: Mcarthur Rossetti, MD;  Location: [facility];  Service: Orthopedics;  Laterality: Right;   TRIGGER FINGER RELEASE Right 09/2018   VAGINAL HYSTERECTOMY  1979   Social History:   reports that she has never smoked. She has never used smokeless tobacco. She reports that she does not drink alcohol and does not use drugs.  Family History  Problem Relation Age of Onset   Ovarian cancer Mother    Uterine cancer Mother    Cerebrovascular Accident Mother    Heart disease Father    Hypertension Brother    Obesity Daughter     Medications: Patient's Medications  New Prescriptions   No medications on file  Previous Medications   ACETAMINOPHEN (TYLENOL) 500 MG TABLET    Take 500-2,000 mg by mouth daily.   ANTISEPTIC ORAL RINSE (BIOTENE) LIQD    15 mLs by Mouth Rinse route daily.   ARTIFICIAL SALIVA (ACT DRY MOUTH) LOZG    Use as directed 1 drop in the mouth or throat as needed (dry mouth).   BIMATOPROST (LUMIGAN) 0.01 % SOLN    Place 1 drop into both eyes at bedtime.    CALCIUM CARB-CHOLECALCIFEROL  (CALTRATE 600+D3) 600-800 MG-UNIT TABS    1 tablet twice daily   CHOLECALCIFEROL (VITAMIN D) 2000 UNITS TABLET    Take 2,000 Units by mouth daily.   DENOSUMAB (PROLIA) 60 MG/ML SOSY INJECTION    Inject 60 mg into the skin every 6 (six) months.   DORZOLAMIDE (TRUSOPT) 2 % OPHTHALMIC SOLUTION    Place 1 drop into both eyes 3 (three) times daily.   ELIQUIS 5 MG TABS TABLET    TAKE 1 TABLET BY MOUTH TWICE A DAY   ESOMEPRAZOLE (NEXIUM) 20 MG CAPSULE    TAKE 1 CAPSULE (20 MG TOTAL) BY MOUTH AS NEEDED.   EZETIMIBE (ZETIA) 10 MG TABLET    TAKE 1 TABLET ONCE DAILY.   METHYLCELLULOSE (ARTIFICIAL TEARS) 1 % OPHTHALMIC SOLUTION    Place 1 drop into both eyes daily as needed (for dry eyes).    METOPROLOL TARTRATE (LOPRESSOR) 25 MG TABLET    Take 12.5 mg by mouth 2 (two) times daily.   PROPAFENONE (RYTHMOL SR) 225 MG 12 HR CAPSULE    TAKE 1 CAPSULE BY MOUTH TWICE A DAY   TRAZODONE (DESYREL) 50 MG TABLET    TAKE 1 TABLET BY MOUTH EVERYDAY AT BEDTIME   TROLAMINE SALICYLATE (ASPERCREME) 10 % CREAM    Apply 1 application topically as needed for muscle pain (for knee pain).   VALSARTAN-HYDROCHLOROTHIAZIDE (DIOVAN-HCT) 320-25 MG TABLET    TAKE 1 TABLET ONCE DAILY.  Modified Medications   No medications on file  Discontinued Medications   ALENDRONATE (FOSAMAX) 70 MG TABLET    TAKE 1 TABLET BY MOUTH EVERY 7 (SEVEN) DAYS. TAKE WITH A FULL GLASS OF WATER ON AN EMPTY STOMACH.   FLUTICASONE (FLONASE) 50 MCG/ACT NASAL SPRAY    INHALE 1 SPRAY INTO BOTH NOSTRILS DAILY   LOPERAMIDE (IMODIUM A-D) 2 MG TABLET    Take 1 tablet (2 mg total) by mouth 4 (four) times daily as needed for  diarrhea or loose stools.    Physical Exam:  Vitals:   10/07/21 1322  BP: (!) 152/94  Pulse: 67  Temp: 97.9 F (36.6 C)  SpO2: 97%  Weight: 186 lb 6.4 oz (84.6 kg)  Height: 5' 3"  (1.6 m)   Body mass index is 33.02 kg/m. Wt Readings from Last 3 Encounters:  10/07/21 186 lb 6.4 oz (84.6 kg)  04/25/21 185 lb 9.6 oz (84.2 kg)  04/04/21  186 lb (84.4 kg)    Physical Exam Constitutional:      General: She is not in acute distress.    Appearance: She is well-developed. She is not diaphoretic.  HENT:     Head: Normocephalic and atraumatic.     Mouth/Throat:     Pharynx: No oropharyngeal exudate.  Eyes:     Conjunctiva/sclera: Conjunctivae normal.     Pupils: Pupils are equal, round, and reactive to light.  Cardiovascular:     Rate and Rhythm: Normal rate and regular rhythm.     Heart sounds: Normal heart sounds.  Pulmonary:     Effort: Pulmonary effort is normal.     Breath sounds: Normal breath sounds.  Abdominal:     General: Bowel sounds are normal.     Palpations: Abdomen is soft.  Musculoskeletal:     Cervical back: Normal range of motion and neck supple.     Right lower leg: No edema.     Left lower leg: No edema.  Skin:    General: Skin is warm and dry.     Comments: Abrasion noted to right elbow, red center, no warmth noted.   Neurological:     Mental Status: She is alert and oriented to person, place, and time. Mental status is at baseline.  Psychiatric:        Mood and Affect: Mood normal.    Labs reviewed: Basic Metabolic Panel: Recent Labs    01/17/21 0938 04/11/21 0958  NA  --  140  K  --  3.9  CL  --  104  CO2  --  30  GLUCOSE  --  98  BUN  --  14  CREATININE  --  0.65  CALCIUM  --  8.8  TSH 3.10  --    Liver Function Tests: Recent Labs    04/11/21 0958  AST 18  ALT 17  BILITOT 0.5  PROT 6.4   No results for input(s): LIPASE, AMYLASE in the last 8760 hours. No results for input(s): AMMONIA in the last 8760 hours. CBC: Recent Labs    04/11/21 0958  WBC 4.6  NEUTROABS 2,024  HGB 12.6  HCT 38.8  MCV 93.0  PLT 197   Lipid Panel: Recent Labs    04/11/21 0958  CHOL 143  HDL 37*  LDLCALC 78  TRIG 179*  CHOLHDL 3.9   TSH: Recent Labs    01/17/21 0938  TSH 3.10   A1C: Lab Results  Component Value Date   HGBA1C 5.2 08/06/2017     Assessment/Plan 1.  Need for influenza vaccination - Flu Vaccine QUAD High Dose(Fluad)  2. Osteoporosis, unspecified osteoporosis type, unspecified pathological fracture presence -continues on cal and vit d with prolia injection every 6 months.  - CMP with eGFR([facility])  3. Essential hypertension -Blood pressure elevated today, but typically well controlled- she has been eating more sodium plans to cut back on that -No changes to medications today, will have her monitor at home and bring to cardiology follow up  -bp goal <  312/81 -follow metabolic panel - CMP with eGFR([facility]) - CBC with Differential/Platelet  4. Paroxysmal atrial fibrillation (HCC) Rate controlled, continues on eliquis for anticoaguation.  - CBC with Differential/Platelet  5. Spinal stenosis of lumbar region with neurogenic claudication Stable at this time. Doing well with PT.   6. Abrasion, elbow w/o infection Covered with mepilex borders, can leave up to 7 days or change sooner if adhesive starts to wear out. Continue to change until heal. To monitor for signs of infection and notify of if area stops healing.   7. Gastroesophageal reflux disease without esophagitis Controlled, using nexium PRN.  8. Primary insomnia Stable, uses trazodone as needed.    Next appt: 6 months, labs prior  Verle Brillhart K. [place], [facility] [phone]

## 2021-10-08 LAB — CBC WITH DIFFERENTIAL/PLATELET
Absolute Monocytes: 421 cells/uL (ref 200–950)
Basophils Absolute: 49 cells/uL (ref 0–200)
Basophils Relative: 1 %
Eosinophils Absolute: 118 cells/uL (ref 15–500)
Eosinophils Relative: 2.4 %
HCT: 37.4 % (ref 35.0–45.0)
Hemoglobin: 12.3 g/dL (ref 11.7–15.5)
Lymphs Abs: 1936 cells/uL (ref 850–3900)
MCH: 30.5 pg (ref 27.0–33.0)
MCHC: 32.9 g/dL (ref 32.0–36.0)
MCV: 92.8 fL (ref 80.0–100.0)
MPV: 10.1 fL (ref 7.5–12.5)
Monocytes Relative: 8.6 %
Neutro Abs: 2377 cells/uL (ref 1500–7800)
Neutrophils Relative %: 48.5 %
Platelets: 160 10*3/uL (ref 140–400)
RBC: 4.03 10*6/uL (ref 3.80–5.10)
RDW: 12.8 % (ref 11.0–15.0)
Total Lymphocyte: 39.5 %
WBC: 4.9 10*3/uL (ref 3.8–10.8)

## 2021-10-08 LAB — COMPLETE METABOLIC PANEL WITH GFR
AG Ratio: 1.4 (calc) (ref 1.0–2.5)
ALT: 19 U/L (ref 6–29)
AST: 21 U/L (ref 10–35)
Albumin: 3.9 g/dL (ref 3.6–5.1)
Alkaline phosphatase (APISO): 29 U/L — ABNORMAL LOW (ref 37–153)
BUN: 20 mg/dL (ref 7–25)
CO2: 28 mmol/L (ref 20–32)
Calcium: 9.3 mg/dL (ref 8.6–10.4)
Chloride: 105 mmol/L (ref 98–110)
Creat: 0.76 mg/dL (ref 0.60–0.95)
Globulin: 2.7 g/dL (calc) (ref 1.9–3.7)
Glucose, Bld: 90 mg/dL (ref 65–139)
Potassium: 3.8 mmol/L (ref 3.5–5.3)
Sodium: 138 mmol/L (ref 135–146)
Total Bilirubin: 0.5 mg/dL (ref 0.2–1.2)
Total Protein: 6.6 g/dL (ref 6.1–8.1)
eGFR: 75 mL/min/{1.73_m2} (ref >=60)

## 2021-10-24 ENCOUNTER — Other Ambulatory Visit: Payer: Self-pay

## 2021-10-24 MED ORDER — VALSARTAN-HYDROCHLOROTHIAZIDE 320-25 MG PO TABS: 1.0000 | ORAL_TABLET | Freq: Every day | ORAL | 1 refills | Status: DC

## 2021-10-24 NOTE — Telephone Encounter (Signed)
Refill request received from pharmacy. High alert when trying to send medication. Medication pended and sent to [name], NP for approval

## 2021-10-25 ENCOUNTER — Other Ambulatory Visit: Payer: Self-pay | Admitting: Nurse Practitioner

## 2021-10-28 ENCOUNTER — Other Ambulatory Visit: Payer: Self-pay | Admitting: Cardiology

## 2021-10-28 DIAGNOSIS — I4819 Other persistent atrial fibrillation: Secondary | ICD-10-CM

## 2021-10-28 NOTE — Telephone Encounter (Signed)
Prescription refill request for Eliquis received. Indication:Afib Last office visit:5/22 Scr:0.7 Age: 87 Weight:84.6 kg  Prescription refilled

## 2021-11-16 NOTE — Progress Notes (Signed)
Cardiology Office Note   Date:  11/23/2021   ID:  [name], DOB Jun 22, 1931, MRN [id number]  PCP:  Lauree Chandler, NP  Cardiologist:   Teah Votaw Martinique, MD   Chief Complaint  Patient presents with   Atrial Flutter       History of Present Illness: [name] is a 87 y.o. female is seen for follow up Afib and dyspnea. She has a long history of marked sinus bradycardia and atrial fibrillation/flutter. She was seen initially in 2014. Echo at that time showed mild LAE otherwise normal. Holter showed mean HR 59 with lowest HR 43 and peak HR 109.    On March 9,2017 she noted an increased HR by BP monitor up to 153. At this time she felt marked indigestion from her waist to her neck. She felt her heart fluttering.   She was seen by Dr. Nyoka Cowden on 03/29/16 and found to be in Afib with RVR. She was started on Eliquis and metoprolol. Myoview study and Echo were normal.   She later had attempt at DCCV. She was in an atypical atrial flutter at that time. DCCV resulted in very prolonged pauses > 6 seconds and return to flutter. She was seen by Dr. Curt Bears and placed on flecainide. This did convert her to NSR but made her feel very sick with nausea, dizziness, and extreme fatigue. Flecainide was discontinued.. She continued to have marked bradycardia and underwent PPM placement on 06/21/16. When seen in September 2018 by Dr. Lovena Le she had an Afib burden of 94%. She was started on propafenone for her Afib. Initially this medication caused her to have more nausea but this has improved.  On her pacemaker checks her Afib burden is less than 1% since December 2019. Last pacemaker check in June was normal.  In June 2020 she underwent L4-5 lumbar laminectomy by Dr Ronnald Ramp. She notes recently some recurrence of numbness in her right leg. Saw Dr Ronnald Ramp and currently getting PT.   She still notes some DOE. This is fairly rare now.   She saw Dr Lovena Le yesterday and pacer checked out OK. No Afib in the last  year.      Past Medical History:  Diagnosis Date   Allergic rhinitis due to pollen    Anxiety state, unspecified    Sitautional- pain   Arthritis    "right knee" (06/21/2016)   Asymptomatic varicose veins    Carpal tunnel syndrome    Chest pain, unspecified    Chronic lower back pain    "on the left side" (06/21/2016)   Constipation    Cramp of limb    Depressive disorder, not elsewhere classified    Diaphragmatic hernia without mention of obstruction or gangrene    Diaphragmatic hernia without mention of obstruction or gangrene    Disturbance of skin sensation    Diverticulosis of colon (without mention of hemorrhage)    Dysrhythmia    Atrial Flutter   Enthesopathy of hip region    Esophageal reflux    , just occasional   History of blood transfusion    "w/both knee replacements"   History of duodenal ulcer    History of kidney stones    passed   Insomnia, unspecified    Lumbago    Migraine    "none since ~ 1990" (06/21/2016)   Myalgia and myositis, unspecified    Obesity, unspecified    Other abnormal blood chemistry    Other dysphagia    Other  malaise and fatigue    Other nonspecific abnormal serum enzyme levels    Other specified cardiac dysrhythmias(427.89)    Pacemaker    Pain in joint, ankle and foot    Pain in joint, hand    Pain in joint, lower leg    Pain in joint, pelvic region and thigh    Pain in limb    Plantar fascial fibromatosis    Presence of permanent cardiac pacemaker    -St Jude   Reflux esophagitis    Sciatica    Spinal stenosis, unspecified region other than cervical    Stricture and stenosis of esophagus    Symptomatic menopausal or female climacteric states    Unspecified essential hypertension    Unspecified essential hypertension    Unspecified vitamin D deficiency     Past Surgical History:  Procedure Laterality Date   BREAST BIOPSY Left 1990s X 2   CARDIOVERSION N/A 04/26/2016   Procedure: CARDIOVERSION;  Surgeon: Pixie Casino, MD;  Location: [facility];  Service: Cardiovascular;  Laterality: N/A;   CATARACT EXTRACTION W/ INTRAOCULAR LENS IMPLANT Left 08/03/1999   DR EPES    CATARACT EXTRACTION W/ INTRAOCULAR LENS IMPLANT Right 2000   DR EPES   CHOLECYSTECTOMY OPEN  1989   DR BOWMAN   COLONOSCOPY  1988   Normal    DENTAL SURGERY Left 08/2016   EP IMPLANTABLE DEVICE N/A 06/21/2016   Procedure: Pacemaker Implant;  Surgeon: Evans Lance, MD;  Location: [facility];  Service: Cardiovascular;  Laterality: N/A;   ESOPHAGOGASTRODUODENOSCOPY (EGD) WITH ESOPHAGEAL DILATION  ~ 1982   Dr. Sharlett Iles   EXCISION OF ACTINIC KERATOSIS     DR LUPTON    EYE SURGERY     INSERT / REPLACE / REMOVE PACEMAKER     JOINT REPLACEMENT     KNEE ARTHROSCOPY Left 2003   KNEE ARTHROSCOPY Right 06-26-13   KNEE CLOSED REDUCTION Right 10/23/2013   Procedure: CLOSED MANIPULATION RIGHT KNEE;  Surgeon: Mcarthur Rossetti, MD;  Location: [facility];  Service: Orthopedics;  Laterality: Right;   LASER FOR CLOUDY CAP LEFT EYE Left 03/2006   DR DIGBY   LUMBAR LAMINECTOMY/DECOMPRESSION MICRODISCECTOMY N/A 06/12/2019   Procedure: Laminectomy and Foraminotomy - Lumbar four-Lumbar five;  Surgeon: Eustace Moore, MD;  Location: [facility];  Service: Neurosurgery;  Laterality: N/A;   [facility], Dr.Gray. Removed area on upper chest   TOTAL KNEE ARTHROPLASTY Left 04/2004   DR RENDALL   TOTAL KNEE ARTHROPLASTY Right 07/04/2013   Procedure: RIGHT TOTAL KNEE ARTHROPLASTY;  Surgeon: Mcarthur Rossetti, MD;  Location: [facility];  Service: Orthopedics;  Laterality: Right;   TRIGGER FINGER RELEASE Right 09/2018   VAGINAL HYSTERECTOMY  1979     Current Outpatient Medications  Medication Sig Dispense Refill   acetaminophen (TYLENOL) 500 MG tablet Take 500-2,000 mg by mouth daily.     antiseptic oral rinse (BIOTENE) LIQD 15 mLs by Mouth Rinse route daily.     Artificial Saliva (ACT DRY MOUTH) LOZG Use as directed 1 drop in  the mouth or throat as needed (dry mouth).     bimatoprost (LUMIGAN) 0.01 % SOLN Place 1 drop into both eyes at bedtime.      Calcium Carb-Cholecalciferol (CALTRATE 600+D3) 600-800 MG-UNIT TABS 1 tablet twice daily 60 tablet 0   Cholecalciferol (VITAMIN D) 2000 UNITS tablet Take 2,000 Units by mouth daily.     denosumab (PROLIA) 60 MG/ML SOSY injection Inject  60 mg into the skin every 6 (six) months.     dorzolamide (TRUSOPT) 2 % ophthalmic solution Place 1 drop into both eyes 3 (three) times daily.     ELIQUIS 5 MG TABS tablet TAKE 1 TABLET BY MOUTH TWICE A DAY 180 tablet 1   esomeprazole (NEXIUM) 20 MG capsule TAKE 1 CAPSULE (20 MG TOTAL) BY MOUTH AS NEEDED. 90 capsule 1   ezetimibe (ZETIA) 10 MG tablet TAKE ONE TABLET ONCE DAILY. 90 tablet 1   methylcellulose (ARTIFICIAL TEARS) 1 % ophthalmic solution Place 1 drop into both eyes daily as needed (for dry eyes).      metoprolol tartrate (LOPRESSOR) 25 MG tablet Take 12.5 mg by mouth 2 (two) times daily.     propafenone (RYTHMOL SR) 225 MG 12 hr capsule TAKE 1 CAPSULE BY MOUTH TWICE A DAY 180 capsule 2   traZODone (DESYREL) 50 MG tablet TAKE 1 TABLET BY MOUTH EVERYDAY AT BEDTIME 90 tablet 1   trolamine salicylate (ASPERCREME) 10 % cream Apply 1 application topically as needed for muscle pain (for knee pain).     valsartan-hydrochlorothiazide (DIOVAN-HCT) 320-25 MG tablet Take 1 tablet by mouth daily. 90 tablet 1   No current facility-administered medications for this visit.    Allergies:   Advil [ibuprofen], Aleve [naproxen sodium], Aspirin, Atorvastatin, Celebrex [celecoxib], Diclofenac, Doxycycline, Fosamax [alendronate sodium], Metoclopramide hcl, Nsaids, Other, Pravastatin, and Timolol    Social History:  The patient  reports that she has never smoked. She has never used smokeless tobacco. She reports that she does not drink alcohol and does not use drugs.   Family History:  The patient's family history includes Cerebrovascular Accident  in her mother; Heart disease in her father; Hypertension in her brother; Obesity in her daughter; Ovarian cancer in her mother; Uterine cancer in her mother.    ROS:  Please see the history of present illness.   Otherwise, review of systems are positive for none.   All other systems are reviewed and negative.    PHYSICAL EXAM: VS:  BP 138/82   Pulse 73   Ht 5\' 3"  (1.6 m)   Wt 184 lb 6.4 oz (83.6 kg)   SpO2 98%   BMI 32.66 kg/m  , BMI Body mass index is 32.66 kg/m. GENERAL:  Well appearing, obese WF in NAD HEENT:  PERRL, EOMI, sclera are clear. Oropharynx is clear. NECK:  No jugular venous distention, carotid upstroke brisk and symmetric, no bruits, no thyromegaly or adenopathy LUNGS:  clear  CHEST:  Unremarkable HEART:  RRR,  PMI not displaced or sustained,S1 and S2 within normal limits, no S3, no S4: no clicks, no rubs, no murmurs ABD:  Soft, nontender. BS +, no masses or bruits. No hepatomegaly, no splenomegaly EXT:  2 + pulses throughout, no edema, no cyanosis no clubbing SKIN:  Warm and dry.  No rashes NEURO:  Alert and oriented x 3. Cranial nerves II through XII intact. PSYCH:  Cognitively intact    EKG:  EKG is not ordered today.    Recent Labs: 01/17/2021: TSH 3.10 10/07/2021: ALT 19; BUN 20; Creat 0.76; Hemoglobin 12.3; Platelets 160; Potassium 3.8; Sodium 138    Lipid Panel    Component Value Date/Time   CHOL 143 04/11/2021 0958   CHOL 179 03/27/2016 0853   TRIG 179 (H) 04/11/2021 0958   HDL 37 (L) 04/11/2021 0958   HDL 42 03/27/2016 0853   CHOLHDL 3.9 04/11/2021 0958   VLDL 42 (H) 08/06/2017 0918   LDLCALC  78 04/11/2021 0958      Wt Readings from Last 3 Encounters:  11/23/21 184 lb 6.4 oz (83.6 kg)  11/22/21 183 lb (83 kg)  10/07/21 186 lb 6.4 oz (84.6 kg)      Other studies Reviewed: Additional studies/ records that were reviewed today include:  Echo: 09/06/17: Study Conclusions   - Left ventricle: The cavity size was normal. Wall thickness was    increased in a pattern of mild LVH. Systolic function was normal.   The estimated ejection fraction was in the range of 55% to 60%.   Doppler parameters are consistent with both elevated ventricular   end-diastolic filling pressure and elevated left atrial filling   pressure. - Left atrium: The atrium was moderately dilated. - Atrial septum: There was increased thickness of the septum,   consistent with lipomatous hypertrophy. No defect or patent   foramen ovale was identified  Echo 10/7//21: IMPRESSIONS     1. Left ventricular ejection fraction, by estimation, is 55 to 60%. The  left ventricle has normal function. The left ventricle has no regional  wall motion abnormalities. There is mild left ventricular hypertrophy.  Left ventricular diastolic parameters  are consistent with Grade I diastolic dysfunction (impaired relaxation).  The E/e' is 62.   2. Right ventricular systolic function is normal. The right ventricular  size is normal.   3. Left atrial size was moderately dilated.   4. The mitral valve is grossly normal. No evidence of mitral valve  regurgitation.   5. The aortic valve is tricuspid. There is mild calcification of the  aortic valve. Aortic valve regurgitation is not visualized.   6. The inferior vena cava is normal in size with greater than 50%  respiratory variability, suggesting right atrial pressure of 3 mmHg.   Comparison(s): No significant change from prior study.   ASSESSMENT AND PLAN:  1.   Atrial fibrillation/flutter with RVR.  Intolerant of flecainide.  Now s/p PPM placement for marked post conversion pauses. On propafenone with marked improvement in Afib burden. Followed by Dr Lovena Le. Pacer check yesterday was normal.  Will continue same. Continue Eliquis for anticoagulation.   2. HTN well controlled.  3. Mild hypercholesterolemia.     Signed, [place] Cradle Martinique, [place]  11/23/2021 2:06 PM    [facility] [address],  [place], [place], [address] Phone [phone], Fax [phone]

## 2021-11-22 ENCOUNTER — Ambulatory Visit: Payer: Medicare Other | Admitting: Internal Medicine

## 2021-11-22 ENCOUNTER — Other Ambulatory Visit: Payer: Self-pay

## 2021-11-22 VITALS — BP 122/72 | HR 62 | Ht 63.0 in | Wt 183.0 lb

## 2021-11-22 DIAGNOSIS — I495 Sick sinus syndrome: Secondary | ICD-10-CM | POA: Diagnosis not present

## 2021-11-22 DIAGNOSIS — I10 Essential (primary) hypertension: Secondary | ICD-10-CM | POA: Diagnosis not present

## 2021-11-22 DIAGNOSIS — I480 Paroxysmal atrial fibrillation: Secondary | ICD-10-CM | POA: Diagnosis not present

## 2021-11-22 DIAGNOSIS — Z950 Presence of cardiac pacemaker: Secondary | ICD-10-CM

## 2021-11-22 NOTE — Patient Instructions (Addendum)
Medication Instructions:  Your physician recommends that you continue on your current medications as directed. Please refer to the Current Medication list given to you today.  Labwork: None ordered.  Testing/Procedures: None ordered.  Follow-Up: Your physician wants you to follow-up in: one year with Cristopher Peru, MD   Remote monitoring is used to monitor your Pacemaker from home. This monitoring reduces the number of office visits required to check your device to one time per year. It allows [place] to keep an eye on the functioning of your device to ensure it is working properly. You are scheduled for a device check from home on 12/20/2021. You may send your transmission at any time that day. If you have a wireless device, the transmission will be sent automatically. After your physician reviews your transmission, you will receive a postcard with your next transmission date.  Any Other Special Instructions Will Be Listed Below (If Applicable).  If you need a refill on your cardiac medications before your next appointment, please call your pharmacy.

## 2021-11-22 NOTE — Progress Notes (Signed)
HPI [name] returns today for followup. She is a pleasant 87 yo woman with a h/o sinus node dysfunction and atrial fib with a RVR,s /p PPM insertion. She has been treated with propafenone and a beta blocker. Since her last visit, she has done well with back surgery which helped improve the weakness in her legs. She denies palpitations. No syncope.  Allergies  Allergen Reactions   Advil [Ibuprofen]     GI upset   Aleve [Naproxen Sodium]     GI upset   Aspirin Nausea And Vomiting    Takes baby aspirin qod without problems   Atorvastatin     Leg cramps   Celebrex [Celecoxib]     GI upset   Diclofenac     GI upset   Doxycycline     Headache   Fosamax [Alendronate Sodium]     GI discomfort    Metoclopramide Hcl     REACTION: heart palps   Nsaids Other (See Comments)    Tears my stomach up    Other     Antihistamine - increase BP   Pravastatin     Leg cramps   Timolol     Dropped HR     Current Outpatient Medications  Medication Sig Dispense Refill   acetaminophen (TYLENOL) 500 MG tablet Take 500-2,000 mg by mouth daily.     antiseptic oral rinse (BIOTENE) LIQD 15 mLs by Mouth Rinse route daily.     Artificial Saliva (ACT DRY MOUTH) LOZG Use as directed 1 drop in the mouth or throat as needed (dry mouth).     bimatoprost (LUMIGAN) 0.01 % SOLN Place 1 drop into both eyes at bedtime.      Calcium Carb-Cholecalciferol (CALTRATE 600+D3) 600-800 MG-UNIT TABS 1 tablet twice daily 60 tablet 0   Cholecalciferol (VITAMIN D) 2000 UNITS tablet Take 2,000 Units by mouth daily.     denosumab (PROLIA) 60 MG/ML SOSY injection Inject 60 mg into the skin every 6 (six) months.     dorzolamide (TRUSOPT) 2 % ophthalmic solution Place 1 drop into both eyes 3 (three) times daily.     ELIQUIS 5 MG TABS tablet TAKE 1 TABLET BY MOUTH TWICE A DAY 180 tablet 1   esomeprazole (NEXIUM) 20 MG capsule TAKE 1 CAPSULE (20 MG TOTAL) BY MOUTH AS NEEDED. 90 capsule 1   ezetimibe (ZETIA) 10 MG  tablet TAKE ONE TABLET ONCE DAILY. 90 tablet 1   methylcellulose (ARTIFICIAL TEARS) 1 % ophthalmic solution Place 1 drop into both eyes daily as needed (for dry eyes).      metoprolol tartrate (LOPRESSOR) 25 MG tablet Take 12.5 mg by mouth 2 (two) times daily.     propafenone (RYTHMOL SR) 225 MG 12 hr capsule TAKE 1 CAPSULE BY MOUTH TWICE A DAY 180 capsule 2   traZODone (DESYREL) 50 MG tablet TAKE 1 TABLET BY MOUTH EVERYDAY AT BEDTIME 90 tablet 1   trolamine salicylate (ASPERCREME) 10 % cream Apply 1 application topically as needed for muscle pain (for knee pain).     valsartan-hydrochlorothiazide (DIOVAN-HCT) 320-25 MG tablet Take 1 tablet by mouth daily. 90 tablet 1   No current facility-administered medications for this visit.     Past Medical History:  Diagnosis Date   Allergic rhinitis due to pollen    Anxiety state, unspecified    Sitautional- pain   Arthritis    "right knee" (06/21/2016)   Asymptomatic varicose veins    Carpal tunnel syndrome  Chest pain, unspecified    Chronic lower back pain    "on the left side" (06/21/2016)   Constipation    Cramp of limb    Depressive disorder, not elsewhere classified    Diaphragmatic hernia without mention of obstruction or gangrene    Diaphragmatic hernia without mention of obstruction or gangrene    Disturbance of skin sensation    Diverticulosis of colon (without mention of hemorrhage)    Dysrhythmia    Atrial Flutter   Enthesopathy of hip region    Esophageal reflux    , just occasional   History of blood transfusion    "w/both knee replacements"   History of duodenal ulcer    History of kidney stones    passed   Insomnia, unspecified    Lumbago    Migraine    "none since ~ 1990" (06/21/2016)   Myalgia and myositis, unspecified    Obesity, unspecified    Other abnormal blood chemistry    Other dysphagia    Other malaise and fatigue    Other nonspecific abnormal serum enzyme levels    Other specified cardiac  dysrhythmias(427.89)    Pacemaker    Pain in joint, ankle and foot    Pain in joint, hand    Pain in joint, lower leg    Pain in joint, pelvic region and thigh    Pain in limb    Plantar fascial fibromatosis    Presence of permanent cardiac pacemaker    -St Jude   Reflux esophagitis    Sciatica    Spinal stenosis, unspecified region other than cervical    Stricture and stenosis of esophagus    Symptomatic menopausal or female climacteric states    Unspecified essential hypertension    Unspecified essential hypertension    Unspecified vitamin D deficiency     ROS:   All systems reviewed and negative except as noted in the HPI.   Past Surgical History:  Procedure Laterality Date   BREAST BIOPSY Left 1990s X 2   CARDIOVERSION N/A 04/26/2016   Procedure: CARDIOVERSION;  Surgeon: Pixie Casino, MD;  Location: [facility];  Service: Cardiovascular;  Laterality: N/A;   CATARACT EXTRACTION W/ INTRAOCULAR LENS IMPLANT Left 08/03/1999   DR EPES    CATARACT EXTRACTION W/ INTRAOCULAR LENS IMPLANT Right 2000   DR EPES   CHOLECYSTECTOMY OPEN  1989   DR BOWMAN   COLONOSCOPY  1988   Normal    DENTAL SURGERY Left 08/2016   EP IMPLANTABLE DEVICE N/A 06/21/2016   Procedure: Pacemaker Implant;  Surgeon: Evans Lance, MD;  Location: [facility];  Service: Cardiovascular;  Laterality: N/A;   ESOPHAGOGASTRODUODENOSCOPY (EGD) WITH ESOPHAGEAL DILATION  ~ 1982   Dr. Sharlett Iles   EXCISION OF ACTINIC KERATOSIS     DR LUPTON    EYE SURGERY     INSERT / REPLACE / REMOVE PACEMAKER     JOINT REPLACEMENT     KNEE ARTHROSCOPY Left 2003   KNEE ARTHROSCOPY Right 06-26-13   KNEE CLOSED REDUCTION Right 10/23/2013   Procedure: CLOSED MANIPULATION RIGHT KNEE;  Surgeon: Mcarthur Rossetti, MD;  Location: [facility];  Service: Orthopedics;  Laterality: Right;   LASER FOR CLOUDY CAP LEFT EYE Left 03/2006   DR DIGBY   LUMBAR LAMINECTOMY/DECOMPRESSION MICRODISCECTOMY N/A 06/12/2019   Procedure:  Laminectomy and Foraminotomy - Lumbar four-Lumbar five;  Surgeon: Eustace Moore, MD;  Location: [facility];  Service: Neurosurgery;  Laterality: N/A;   SKIN SURGERY  [facility], Dr.Gray. Removed area on upper chest   TOTAL KNEE ARTHROPLASTY Left 04/2004   DR RENDALL   TOTAL KNEE ARTHROPLASTY Right 07/04/2013   Procedure: RIGHT TOTAL KNEE ARTHROPLASTY;  Surgeon: Mcarthur Rossetti, MD;  Location[facility];  Service: Orthopedics;  Laterality: Right;   TRIGGER FINGER RELEASE Right 09/2018   VAGINAL HYSTERECTOMY  1979     Family History  Problem Relation Age of Onset   Ovarian cancer Mother    Uterine cancer Mother    Cerebrovascular Accident Mother    Heart disease Father    Hypertension Brother    Obesity Daughter      Social History   Socioeconomic History   Marital status: Married    Spouse name: Not on file   Number of children: Not on file   Years of education: Not on file   Highest education level: Not on file  Occupational History   Not on file  Tobacco Use   Smoking status: Never   Smokeless tobacco: Never  Vaping Use   Vaping Use: Never used  Substance and Sexual Activity   Alcohol use: No   Drug use: No   Sexual activity: Yes  Other Topics Concern   Not on file  Social History Narrative   Not on file   Social Determinants of Health   Financial Resource Strain: Not on file  Food Insecurity: Not on file  Transportation Needs: Not on file  Physical Activity: Not on file  Stress: Not on file  Social Connections: Not on file  Intimate Partner Violence: Not on file     BP 122/72   Pulse 62   Ht 5\' 3"  (1.6 m)   Wt 183 lb (83 kg)   SpO2 96%   BMI 32.42 kg/m   Physical Exam:  Well appearing NAD HEENT: Unremarkable Neck:  No JVD, no thyromegally Lymphatics:  No adenopathy Back:  No CVA tenderness Lungs:  Clear with no wheezes HEART:  Regular rate rhythm, no murmurs, no rubs, no clicks Abd:  soft, positive bowel sounds, no organomegally,  no rebound, no guarding Ext:  2 plus pulses, no edema, no cyanosis, no clubbing Skin:  No rashes no nodules Neuro:  CN II through XII intact, motor grossly intact  EKG - nsr with atrial pacing  DEVICE  Normal device function.  See PaceArt for details.   Assess/Plan:  1. PAF - she is maintaining NSR very nicely on low dose propafenone. She will continue. 2. HTN - her bp is ok. I suspect that her fatigue is related to her metoprolol and have asked her to continue 25mg  bid. 3. Sinus node dysfunction - she is pacing in the atrium over 99% of the time. 4. PPM - her St. Jude DDD PM is working normally. Her rates are improved.    [name] Veronica Farias,MD

## 2021-11-23 ENCOUNTER — Ambulatory Visit: Payer: Medicare Other | Admitting: Cardiology

## 2021-11-23 ENCOUNTER — Encounter: Payer: Self-pay | Admitting: Cardiology

## 2021-11-23 VITALS — BP 138/82 | HR 73 | Ht 63.0 in | Wt 184.4 lb

## 2021-11-23 DIAGNOSIS — I495 Sick sinus syndrome: Secondary | ICD-10-CM

## 2021-11-23 DIAGNOSIS — I480 Paroxysmal atrial fibrillation: Secondary | ICD-10-CM

## 2021-11-23 DIAGNOSIS — Z950 Presence of cardiac pacemaker: Secondary | ICD-10-CM

## 2021-11-23 DIAGNOSIS — I10 Essential (primary) hypertension: Secondary | ICD-10-CM | POA: Diagnosis not present

## 2021-12-08 ENCOUNTER — Other Ambulatory Visit: Payer: Self-pay | Admitting: Internal Medicine

## 2021-12-08 NOTE — Telephone Encounter (Signed)
The patient has questions about her medication. She would like for Dr. Lovena Le nurse. Her phone number is [phone].

## 2021-12-09 ENCOUNTER — Telehealth: Payer: Self-pay

## 2021-12-09 MED ORDER — METOPROLOL TARTRATE 25 MG PO TABS: 25.0000 mg | ORAL_TABLET | Freq: Two times a day (BID) | ORAL | 3 refills | Status: DC

## 2021-12-09 NOTE — Telephone Encounter (Signed)
Refilled under alternate phone message.

## 2021-12-09 NOTE — Telephone Encounter (Signed)
The patient has questions about her medication. She would like for Dr. Lovena Le nurse. Her phone number is [phone].

## 2021-12-09 NOTE — Telephone Encounter (Signed)
Returned call to Pt.  Corrected Pt's medications to reflect metoprolol tartrate 25 mg PO BID.  Sent refill to pharmacy.

## 2021-12-20 ENCOUNTER — Ambulatory Visit (INDEPENDENT_AMBULATORY_CARE_PROVIDER_SITE_OTHER): Payer: Medicare Other

## 2021-12-20 DIAGNOSIS — I495 Sick sinus syndrome: Secondary | ICD-10-CM

## 2021-12-20 LAB — CUP PACEART REMOTE DEVICE CHECK
Battery Remaining Longevity: 57 mo
Battery Remaining Percentage: 49 %
Battery Voltage: 2.98 V
Brady Statistic AP VP Percent: 3.8 %
Brady Statistic AP VS Percent: 96 %
Brady Statistic AS VP Percent: 1 %
Brady Statistic AS VS Percent: 1 %
Brady Statistic RA Percent Paced: 99 %
Brady Statistic RV Percent Paced: 3.9 %
Date Time Interrogation Session: 20221227020018
Implantable Lead Implant Date: 20170628
Implantable Lead Implant Date: 20170628
Implantable Lead Location: 753859
Implantable Lead Location: 753860
Implantable Pulse Generator Implant Date: 20170628
Lead Channel Impedance Value: 460 Ohm
Lead Channel Impedance Value: 550 Ohm
Lead Channel Pacing Threshold Amplitude: 0.75 V
Lead Channel Pacing Threshold Amplitude: 1.25 V
Lead Channel Pacing Threshold Pulse Width: 0.4 ms
Lead Channel Pacing Threshold Pulse Width: 0.4 ms
Lead Channel Sensing Intrinsic Amplitude: 1.3 mV
Lead Channel Sensing Intrinsic Amplitude: 7.7 mV
Lead Channel Setting Pacing Amplitude: 2 V
Lead Channel Setting Pacing Amplitude: 2.5 V
Lead Channel Setting Pacing Pulse Width: 0.4 ms
Lead Channel Setting Sensing Sensitivity: 2 mV
Pulse Gen Model: 2272
Pulse Gen Serial Number: 7910313

## 2021-12-28 NOTE — Progress Notes (Signed)
Remote pacemaker transmission.   

## 2022-01-10 ENCOUNTER — Other Ambulatory Visit: Payer: Self-pay

## 2022-01-10 ENCOUNTER — Ambulatory Visit: Payer: Medicare HMO

## 2022-01-10 DIAGNOSIS — M810 Age-related osteoporosis without current pathological fracture: Secondary | ICD-10-CM

## 2022-01-10 MED ORDER — DENOSUMAB 60 MG/ML ~~LOC~~ SOSY
60.0000 mg | PREFILLED_SYRINGE | Freq: Once | SUBCUTANEOUS | Status: AC
  Administered 2022-01-10: 60 mg via SUBCUTANEOUS

## 2022-01-25 DIAGNOSIS — H353131 Nonexudative age-related macular degeneration, bilateral, early dry stage: Secondary | ICD-10-CM | POA: Diagnosis not present

## 2022-01-25 DIAGNOSIS — H04123 Dry eye syndrome of bilateral lacrimal glands: Secondary | ICD-10-CM | POA: Diagnosis not present

## 2022-01-25 DIAGNOSIS — H401131 Primary open-angle glaucoma, bilateral, mild stage: Secondary | ICD-10-CM | POA: Diagnosis not present

## 2022-01-31 ENCOUNTER — Telehealth: Payer: Self-pay

## 2022-01-31 ENCOUNTER — Other Ambulatory Visit: Payer: Self-pay

## 2022-01-31 ENCOUNTER — Ambulatory Visit (INDEPENDENT_AMBULATORY_CARE_PROVIDER_SITE_OTHER): Payer: Medicare HMO | Admitting: Nurse Practitioner

## 2022-01-31 ENCOUNTER — Encounter: Payer: Self-pay | Admitting: Nurse Practitioner

## 2022-01-31 DIAGNOSIS — Z0000 Encounter for general adult medical examination without abnormal findings: Secondary | ICD-10-CM | POA: Diagnosis not present

## 2022-01-31 NOTE — Telephone Encounter (Signed)
Ms. daleiza, bacchi are scheduled for a virtual visit with your provider today.    Just as we do with appointments in the office, we must obtain your consent to participate.  Your consent will be active for this visit and any virtual visit you may have with one of our providers in the next 365 days.    If you have a MyChart account, I can also send a copy of this consent to you electronically.  All virtual visits are billed to your insurance company just like a traditional visit in the office.  As this is a virtual visit, video technology does not allow for your provider to perform a traditional examination.  This may limit your provider's ability to fully assess your condition.  If your provider identifies any concerns that need to be evaluated in person or the need to arrange testing such as labs, EKG, etc, we will make arrangements to do so.    Although advances in technology are sophisticated, we cannot ensure that it will always work on either your end or our end.  If the connection with a video visit is poor, we may have to switch to a telephone visit.  With either a video or telephone visit, we are not always able to ensure that we have a secure connection.   I need to obtain your verbal consent now.   Are you willing to proceed with your visit today?   COREE RIESTER has provided verbal consent on 01/31/2022 for a virtual visit (video or telephone).   Carroll Kinds, CMA 01/31/2022  10:53 AM

## 2022-01-31 NOTE — Patient Instructions (Signed)
[name] , Thank you for taking time to come for your Medicare Wellness Visit. I appreciate your ongoing commitment to your health goals. Please review the following plan we discussed and let me know if I can assist you in the future.   Screening recommendations/referrals: Colonoscopy aged out Mammogram aged out Bone Density up to date Recommended yearly ophthalmology/optometry visit for glaucoma screening and checkup Recommended yearly dental visit for hygiene and checkup  Vaccinations: Influenza vaccine up to date Pneumococcal vaccine up to date Tdap vaccine up to date Shingles vaccine up to date    Advanced directives: on file.  Conditions/risks identified: advance age  Next appointment: yearly for AWV   Preventive Care 41 Years and Older, Female Preventive care refers to lifestyle choices and visits with your health care provider that can promote health and wellness. What does preventive care include? A yearly physical exam. This is also called an annual well check. Dental exams once or twice a year. Routine eye exams. Ask your health care provider how often you should have your eyes checked. Personal lifestyle choices, including: Daily care of your teeth and gums. Regular physical activity. Eating a healthy diet. Avoiding tobacco and drug use. Limiting alcohol use. Practicing safe sex. Taking low-dose aspirin every day. Taking vitamin and mineral supplements as recommended by your health care provider. What happens during an annual well check? The services and screenings done by your health care provider during your annual well check will depend on your age, overall health, lifestyle risk factors, and family history of disease. Counseling  Your health care provider may ask you questions about your: Alcohol use. Tobacco use. Drug use. Emotional well-being. Home and relationship well-being. Sexual activity. Eating habits. History of falls. Memory and ability to  understand (cognition). Work and work Statistician. Reproductive health. Screening  You may have the following tests or measurements: Height, weight, and BMI. Blood pressure. Lipid and cholesterol levels. These may be checked every 5 years, or more frequently if you are over 86 years old. Skin check. Lung cancer screening. You may have this screening every year starting at age 37 if you have a 30-pack-year history of smoking and currently smoke or have quit within the past 15 years. Fecal occult blood test (FOBT) of the stool. You may have this test every year starting at age 12. Flexible sigmoidoscopy or colonoscopy. You may have a sigmoidoscopy every 5 years or a colonoscopy every 10 years starting at age 73. Hepatitis C blood test. Hepatitis B blood test. Sexually transmitted disease (STD) testing. Diabetes screening. This is done by checking your blood sugar (glucose) after you have not eaten for a while (fasting). You may have this done every 1-3 years. Bone density scan. This is done to screen for osteoporosis. You may have this done starting at age 43. Mammogram. This may be done every 1-2 years. Talk to your health care provider about how often you should have regular mammograms. Talk with your health care provider about your test results, treatment options, and if necessary, the need for more tests. Vaccines  Your health care provider may recommend certain vaccines, such as: Influenza vaccine. This is recommended every year. Tetanus, diphtheria, and acellular pertussis (Tdap, Td) vaccine. You may need a Td booster every 10 years. Zoster vaccine. You may need this after age 86. Pneumococcal 13-valent conjugate (PCV13) vaccine. One dose is recommended after age 81. Pneumococcal polysaccharide (PPSV23) vaccine. One dose is recommended after age 75. Talk to your health care provider about  which screenings and vaccines you need and how often you need them. This information is not  intended to replace advice given to you by your health care provider. Make sure you discuss any questions you have with your health care provider. Document Released: 01/07/2016 Document Revised: 08/30/2016 Document Reviewed: 10/12/2015 Elsevier Interactive Patient Education  2017 [employer] Prevention in the Home Falls can cause injuries. They can happen to people of all ages. There are many things you can do to make your home safe and to help prevent falls. What can I do on the outside of my home? Regularly fix the edges of walkways and driveways and fix any cracks. Remove anything that might make you trip as you walk through a door, such as a raised step or threshold. Trim any bushes or trees on the path to your home. Use bright outdoor lighting. Clear any walking paths of anything that might make someone trip, such as rocks or tools. Regularly check to see if handrails are loose or broken. Make sure that both sides of any steps have handrails. Any raised decks and porches should have guardrails on the edges. Have any leaves, snow, or ice cleared regularly. Use sand or salt on walking paths during winter. Clean up any spills in your garage right away. This includes oil or grease spills. What can I do in the bathroom? Use night lights. Install grab bars by the toilet and in the tub and shower. Do not use towel bars as grab bars. Use non-skid mats or decals in the tub or shower. If you need to sit down in the shower, use a plastic, non-slip stool. Keep the floor dry. Clean up any water that spills on the floor as soon as it happens. Remove soap buildup in the tub or shower regularly. Attach bath mats securely with double-sided non-slip rug tape. Do not have throw rugs and other things on the floor that can make you trip. What can I do in the bedroom? Use night lights. Make sure that you have a light by your bed that is easy to reach. Do not use any sheets or blankets that are  too big for your bed. They should not hang down onto the floor. Have a firm chair that has side arms. You can use this for support while you get dressed. Do not have throw rugs and other things on the floor that can make you trip. What can I do in the kitchen? Clean up any spills right away. Avoid walking on wet floors. Keep items that you use a lot in easy-to-reach places. If you need to reach something above you, use a strong step stool that has a grab bar. Keep electrical cords out of the way. Do not use floor polish or wax that makes floors slippery. If you must use wax, use non-skid floor wax. Do not have throw rugs and other things on the floor that can make you trip. What can I do with my stairs? Do not leave any items on the stairs. Make sure that there are handrails on both sides of the stairs and use them. Fix handrails that are broken or loose. Make sure that handrails are as long as the stairways. Check any carpeting to make sure that it is firmly attached to the stairs. Fix any carpet that is loose or worn. Avoid having throw rugs at the top or bottom of the stairs. If you do have throw rugs, attach them to the floor with  carpet tape. Make sure that you have a light switch at the top of the stairs and the bottom of the stairs. If you do not have them, ask someone to add them for you. What else can I do to help prevent falls? Wear shoes that: Do not have high heels. Have rubber bottoms. Are comfortable and fit you well. Are closed at the toe. Do not wear sandals. If you use a stepladder: Make sure that it is fully opened. Do not climb a closed stepladder. Make sure that both sides of the stepladder are locked into place. Ask someone to hold it for you, if possible. Clearly mark and make sure that you can see: Any grab bars or handrails. First and last steps. Where the edge of each step is. Use tools that help you move around (mobility aids) if they are needed. These  include: Canes. Walkers. Scooters. Crutches. Turn on the lights when you go into a dark area. Replace any light bulbs as soon as they burn out. Set up your furniture so you have a clear path. Avoid moving your furniture around. If any of your floors are uneven, fix them. If there are any pets around you, be aware of where they are. Review your medicines with your doctor. Some medicines can make you feel dizzy. This can increase your chance of falling. Ask your doctor what other things that you can do to help prevent falls. This information is not intended to replace advice given to you by your health care provider. Make sure you discuss any questions you have with your health care provider. Document Released: 10/07/2009 Document Revised: 05/18/2016 Document Reviewed: 01/15/2015 Elsevier Interactive Patient Education  2017 [employer].

## 2022-01-31 NOTE — Progress Notes (Signed)
Subjective:   [name] is a 88 y.o. female who presents for Medicare Annual (Subsequent) preventive examination.  Review of Systems     Cardiac Risk Factors include: advanced age (>80men, >44 women);family history of premature cardiovascular disease;obesity (BMI >30kg/m2)     Objective:    There were no vitals filed for this visit. There is no height or weight on file to calculate BMI.  Advanced Directives 01/31/2022 10/07/2021 07/12/2021 01/24/2021 12/28/2020 10/01/2020 04/21/2020  Does Patient Have a Medical Advance Directive? Yes Yes Yes Yes Yes Yes Yes  Type of Paramedic of [place];Out of facility DNR (pink MOST or yellow form) [place];Out of facility DNR (pink MOST or yellow form) [place];Living will;Out of facility DNR (pink MOST or yellow form) [place];Out of facility DNR (pink MOST or yellow form) [place];Out of facility DNR (pink MOST or yellow form) [place];Out of facility DNR (pink MOST or yellow form) Healthcare Power of Attorney  Does patient want to make changes to medical advance directive? No - Patient declined No - Patient declined No - Patient declined No - Patient declined No - Patient declined No - Patient declined No - Patient declined  Copy of [place] in Chart? Yes - validated most recent copy scanned in chart (See row information) Yes - validated most recent copy scanned in chart (See row information) Yes - validated most recent copy scanned in chart (See row information) Yes - validated most recent copy scanned in chart (See row information) Yes - validated most recent copy scanned in chart (See row information) Yes - validated most recent copy scanned in chart (See row information) Yes - validated most recent copy scanned in chart (See row information)  Pre-existing out of facility DNR order (yellow form or pink MOST form)  Yellow form placed in chart (order not valid for inpatient use);Pink MOST form placed in chart (order not valid for inpatient use) Yellow form placed in chart (order not valid for inpatient use);Pink MOST form placed in chart (order not valid for inpatient use) - - Yellow form placed in chart (order not valid for inpatient use);Pink MOST form placed in chart (order not valid for inpatient use) Yellow form placed in chart (order not valid for inpatient use);Pink MOST form placed in chart (order not valid for inpatient use) -    Current Medications (verified) Outpatient Encounter Medications as of 01/31/2022  Medication Sig   acetaminophen (TYLENOL) 500 MG tablet Take 500-2,000 mg by mouth daily.   antiseptic oral rinse (BIOTENE) LIQD 15 mLs by Mouth Rinse route daily.   Artificial Saliva (ACT DRY MOUTH) LOZG Use as directed 1 drop in the mouth or throat as needed (dry mouth).   bimatoprost (LUMIGAN) 0.01 % SOLN Place 1 drop into both eyes at bedtime.    Calcium Carb-Cholecalciferol (CALTRATE 600+D3) 600-800 MG-UNIT TABS 1 tablet twice daily   Cholecalciferol (VITAMIN D) 2000 UNITS tablet Take 2,000 Units by mouth daily.   denosumab (PROLIA) 60 MG/ML SOSY injection Inject 60 mg into the skin every 6 (six) months.   dorzolamide (TRUSOPT) 2 % ophthalmic solution Place 1 drop into both eyes 3 (three) times daily.   ELIQUIS 5 MG TABS tablet TAKE 1 TABLET BY MOUTH TWICE A DAY   esomeprazole (NEXIUM) 20 MG capsule TAKE 1 CAPSULE (20 MG TOTAL) BY MOUTH AS NEEDED.   ezetimibe (ZETIA) 10 MG tablet TAKE ONE TABLET  ONCE DAILY.   methylcellulose (ARTIFICIAL TEARS) 1 % ophthalmic solution Place 1 drop into both eyes daily as needed (for dry eyes).    metoprolol tartrate (LOPRESSOR) 25 MG tablet Take 1 tablet (25 mg total) by mouth 2 (two) times daily.   propafenone (RYTHMOL SR) 225 MG 12 hr capsule TAKE 1 CAPSULE BY MOUTH TWICE A DAY   traZODone (DESYREL) 50 MG tablet TAKE 1 TABLET BY MOUTH EVERYDAY AT BEDTIME  (Patient taking differently: at bedtime as needed.)   trolamine salicylate (ASPERCREME) 10 % cream Apply 1 application topically as needed for muscle pain (for knee pain).   valsartan-hydrochlorothiazide (DIOVAN-HCT) 320-25 MG tablet Take 1 tablet by mouth daily.   No facility-administered encounter medications on file as of 01/31/2022.    Allergies (verified) Advil [ibuprofen], Aleve [naproxen sodium], Aspirin, Atorvastatin, Celebrex [celecoxib], Diclofenac, Doxycycline, Fosamax [alendronate sodium], Metoclopramide hcl, Nsaids, Other, Pravastatin, and Timolol   History: Past Medical History:  Diagnosis Date   Allergic rhinitis due to pollen    Anxiety state, unspecified    Sitautional- pain   Arthritis    "right knee" (06/21/2016)   Asymptomatic varicose veins    Carpal tunnel syndrome    Chest pain, unspecified    Chronic lower back pain    "on the left side" (06/21/2016)   Constipation    Cramp of limb    Depressive disorder, not elsewhere classified    Diaphragmatic hernia without mention of obstruction or gangrene    Diaphragmatic hernia without mention of obstruction or gangrene    Disturbance of skin sensation    Diverticulosis of colon (without mention of hemorrhage)    Dysrhythmia    Atrial Flutter   Enthesopathy of hip region    Esophageal reflux    , just occasional   History of blood transfusion    "w/both knee replacements"   History of duodenal ulcer    History of kidney stones    passed   Insomnia, unspecified    Lumbago    Migraine    "none since ~ 1990" (06/21/2016)   Myalgia and myositis, unspecified    Obesity, unspecified    Other abnormal blood chemistry    Other dysphagia    Other malaise and fatigue    Other nonspecific abnormal serum enzyme levels    Other specified cardiac dysrhythmias(427.89)    Pacemaker    Pain in joint, ankle and foot    Pain in joint, hand    Pain in joint, lower leg    Pain in joint, pelvic region and thigh    Pain in  limb    Plantar fascial fibromatosis    Presence of permanent cardiac pacemaker    -St Jude   Reflux esophagitis    Sciatica    Spinal stenosis, unspecified region other than cervical    Stricture and stenosis of esophagus    Symptomatic menopausal or female climacteric states    Unspecified essential hypertension    Unspecified essential hypertension    Unspecified vitamin D deficiency    Past Surgical History:  Procedure Laterality Date   BREAST BIOPSY Left 1990s X 2   CARDIOVERSION N/A 04/26/2016   Procedure: CARDIOVERSION;  Surgeon: Pixie Casino, MD;  Location: [facility];  Service: Cardiovascular;  Laterality: N/A;   CATARACT EXTRACTION W/ INTRAOCULAR LENS IMPLANT Left 08/03/1999   DR EPES    CATARACT EXTRACTION W/ INTRAOCULAR LENS IMPLANT Right 2000   DR EPES   CHOLECYSTECTOMY OPEN  1989   DR Deon Pilling  COLONOSCOPY  1988   Normal    DENTAL SURGERY Left 08/2016   EP IMPLANTABLE DEVICE N/A 06/21/2016   Procedure: Pacemaker Implant;  Surgeon: Evans Lance, MD;  Location: [facility];  Service: Cardiovascular;  Laterality: N/A;   ESOPHAGOGASTRODUODENOSCOPY (EGD) WITH ESOPHAGEAL DILATION  ~ 1982   Dr. Sharlett Iles   EXCISION OF ACTINIC KERATOSIS     DR LUPTON    EYE SURGERY     INSERT / REPLACE / REMOVE PACEMAKER     JOINT REPLACEMENT     KNEE ARTHROSCOPY Left 2003   KNEE ARTHROSCOPY Right 06-26-13   KNEE CLOSED REDUCTION Right 10/23/2013   Procedure: CLOSED MANIPULATION RIGHT KNEE;  Surgeon: Mcarthur Rossetti, MD;  Location: [facility];  Service: Orthopedics;  Laterality: Right;   LASER FOR CLOUDY CAP LEFT EYE Left 03/2006   DR DIGBY   LUMBAR LAMINECTOMY/DECOMPRESSION MICRODISCECTOMY N/A 06/12/2019   Procedure: Laminectomy and Foraminotomy - Lumbar four-Lumbar five;  Surgeon: Eustace Moore, MD;  Location: [facility];  Service: Neurosurgery;  Laterality: N/A;   [facility], Dr.Gray. Removed area on upper chest   TOTAL KNEE ARTHROPLASTY Left  04/2004   DR RENDALL   TOTAL KNEE ARTHROPLASTY Right 07/04/2013   Procedure: RIGHT TOTAL KNEE ARTHROPLASTY;  Surgeon: Mcarthur Rossetti, MD;  Location: [facility];  Service: Orthopedics;  Laterality: Right;   TRIGGER FINGER RELEASE Right 09/2018   VAGINAL HYSTERECTOMY  1979   Family History  Problem Relation Age of Onset   Ovarian cancer Mother    Uterine cancer Mother    Cerebrovascular Accident Mother    Heart disease Father    Hypertension Brother    Obesity Daughter    Social History   Socioeconomic History   Marital status: Married    Spouse name: Not on file   Number of children: Not on file   Years of education: Not on file   Highest education level: Not on file  Occupational History   Not on file  Tobacco Use   Smoking status: Never   Smokeless tobacco: Never  Vaping Use   Vaping Use: Never used  Substance and Sexual Activity   Alcohol use: No   Drug use: No   Sexual activity: Yes  Other Topics Concern   Not on file  Social History Narrative   Not on file   Social Determinants of Health   Financial Resource Strain: Not on file  Food Insecurity: Not on file  Transportation Needs: Not on file  Physical Activity: Not on file  Stress: Not on file  Social Connections: Not on file    Tobacco Counseling Counseling given: Not Answered   Clinical Intake:  Pre-visit preparation completed: Yes  Pain : 0-10 Pain Type: Chronic pain Pain Location: Leg Pain Descriptors / Indicators: Burning Pain Relieving Factors: exercise  Pain Relieving Factors: exercise  BMI - recorded: 32 Nutritional Status: BMI > 30  Obese Diabetes: No  How often do you need to have someone help you when you read instructions, pamphlets, or other written materials from your doctor or pharmacy?: 1 - Never  Diabetic?no         Activities of Daily Living In your present state of health, do you have any difficulty performing the following activities: 01/31/2022  Hearing? N   Vision? N  Difficulty concentrating or making decisions? N  Walking or climbing stairs? N  Dressing or bathing? N  Doing errands, shopping? N  Preparing Food  and eating ? N  Using the Toilet? N  In the past six months, have you accidently leaked urine? Y  Do you have problems with loss of bowel control? N  Managing your Medications? N  Managing your Finances? N  Housekeeping or managing your Housekeeping? N  Some recent data might be hidden    Patient Care Team: Lauree Chandler, NP as PCP - General (Geriatric Medicine) [place], Peter M, MD as PCP - Cardiology (Cardiology) Mcarthur Rossetti, MD as Attending Physician (Orthopedic Surgery) Larey Dresser, MD as Attending Physician (Cardiology) Thornell Sartorius, MD as Consulting Physician (Otolaryngology) Druscilla Brownie, MD as Consulting Physician (Dermatology) Calvert Cantor, MD as Consulting Physician (Ophthalmology) Debara Pickett Nadean Corwin, MD as Consulting Physician (Cardiology) Eustace Moore, MD as Consulting Physician (Neurosurgery)  Indicate any recent Medical Services you may have received from other than [facility] providers in the past year (date may be approximate).     Assessment:   This is a routine wellness examination for [place].  Hearing/Vision screen Hearing Screening - Comments:: Patient has no hearing problems. Vision Screening - Comments:: Patient goes to [facility]. Patient just had eye exam last week.  Dietary issues and exercise activities discussed: Current Exercise Habits: Structured exercise class;Home exercise routine, Type of exercise: calisthenics;walking;strength training/weights;stretching, Time (Minutes): 30, Frequency (Times/Week): 3, Weekly Exercise (Minutes/Week): 90   Goals Addressed   None    Depression Screen PHQ 2/9 Scores 01/31/2022 01/24/2021 10/01/2020 01/12/2020 01/06/2019 01/02/2018 11/21/2017  PHQ - 2 Score 0 0 0 0 0 0 0    Fall Risk Fall Risk  01/31/2022 10/07/2021 07/12/2021  04/04/2021 01/24/2021  Falls in the past year? 1 1 0 1 1  Number falls in past yr: 1 0 0 0 0  Comment - - - - -  Injury with Fall? 1 1 0 1 0  Comment - - - - -  Risk Factor Category  - - - - -  Risk for fall due to : History of fall(s) - No Fall Risks History of fall(s) History of fall(s)  Follow up Falls evaluation completed Falls evaluation completed Falls evaluation completed - -  Comment - - - - -    FALL RISK PREVENTION PERTAINING TO THE HOME:  Any stairs in or around the home? Yes  If so, are there any without handrails? No  Home free of loose throw rugs in walkways, pet beds, electrical cords, etc? Yes  Adequate lighting in your home to reduce risk of falls? Yes   ASSISTIVE DEVICES UTILIZED TO PREVENT FALLS:  Life alert? No  Use of a cane, walker or w/c? No  Grab bars in the bathroom? Yes  Shower chair or bench in shower? No  Elevated toilet seat or a handicapped toilet? Yes   TIMED UP AND GO:  Was the test performed? No .    Cognitive Function: MMSE - Mini Mental State Exam 01/06/2019 11/21/2017 12/12/2016 11/30/2015  Orientation to time 5 5 5 5   Orientation to Place 5 5 5 5   Registration 3 3 3 3   Attention/ Calculation 5 5 5 5   Recall 3 3 3 3   Language- name 2 objects 2 2 2 2   Language- repeat 1 1 1 1   Language- follow 3 step command 3 3 3 3   Language- read & follow direction 1 1 1 1   Write a sentence 1 1 1 1   Copy design 1 1 1 1   Total score 30 30 30  30  6CIT Screen 01/31/2022 01/24/2021 01/12/2020  What Year? 0 points 0 points 0 points  What month? 0 points 0 points 0 points  What time? 0 points 0 points 0 points  Count back from 20 0 points 0 points 0 points  Months in reverse 0 points 0 points 0 points  Repeat phrase 0 points 0 points 0 points  Total Score 0 0 0    Immunizations Immunization History  Administered Date(s) Administered   Fluad Quad(high Dose 65+) 09/29/2019, 10/01/2020, 10/07/2021   Influenza, High Dose Seasonal PF 09/14/2017,  10/02/2018   Influenza,inj,Quad PF,6+ Mos 09/23/2013, 09/25/2014, 10/11/2015, 08/30/2016   PFIZER(Purple Top)SARS-COV-2 Vaccination 01/16/2020, 02/06/2020, 09/29/2020   Pneumococcal Conjugate-13 02/17/2015   Pneumococcal Polysaccharide-23 12/12/2016   Tdap 06/24/2017   Zoster Recombinat (Shingrix) 09/04/2017, 02/21/2018   Zoster, Live 01/29/2008    TDAP status: Up to date  Flu Vaccine status: Up to date  Pneumococcal vaccine status: Up to date  Covid-19 vaccine status: Information provided on how to obtain vaccines.   Qualifies for Shingles Vaccine? Yes   Zostavax completed No   Shingrix Completed?: Yes  Screening Tests Health Maintenance  Topic Date Due   MAMMOGRAM  10/05/2021   COVID-19 Vaccine (4 - Booster for Pfizer series) 02/16/2022 (Originally 11/24/2020)   TETANUS/TDAP  06/25/2027   Pneumonia Vaccine 104+ Years old  Completed   INFLUENZA VACCINE  Completed   DEXA SCAN  Completed   Zoster Vaccines- Shingrix  Completed   HPV VACCINES  Aged Out    Health Maintenance  Health Maintenance Due  Topic Date Due   MAMMOGRAM  10/05/2021    Colorectal cancer screening: No longer required.   Mammogram status: No longer required due to age.  Bone Density status: Completed 05/2721. Results reflect: Bone density results: OSTEOPOROSIS. Repeat every 2 years.  Lung Cancer Screening: (Low Dose CT Chest recommended if Age 48-80 years, 30 pack-year currently smoking OR have quit w/in 15years.) does not qualify.   Lung Cancer Screening Referral: na  Additional Screening:  Hepatitis C Screening: does not qualify; Completed   Vision Screening: Recommended annual ophthalmology exams for early detection of glaucoma and other disorders of the eye. Is the patient up to date with their annual eye exam?  Yes  Who is the provider or what is the name of the office in which the patient attends annual eye exams? [facility] If pt is not established with a provider, would they like to be  referred to a provider to establish care? No .   Dental Screening: Recommended annual dental exams for proper oral hygiene  Community Resource Referral / Chronic Care Management: CRR required this visit?  No   CCM required this visit?  No      Plan:     I have personally reviewed and noted the following in the patients chart:   Medical and social history Use of alcohol, tobacco or illicit drugs  Current medications and supplements including opioid prescriptions.  Functional ability and status Nutritional status Physical activity Advanced directives List of other physicians Hospitalizations, surgeries, and ER visits in previous 12 months Vitals Screenings to include cognitive, depression, and falls Referrals and appointments  In addition, I have reviewed and discussed with patient certain preventive protocols, quality metrics, and best practice recommendations. A written personalized care plan for preventive services as well as general preventive health recommendations were provided to patient.     Lauree Chandler, NP   01/31/2022    Virtual Visit via Telephone Note  I connected with patient 01/31/22 at 10:00 AM EST by telephone and verified that I am speaking with the correct person using two identifiers.  Location: Patient: home Provider: [facility]   I discussed the limitations, risks, security and privacy concerns of performing an evaluation and management service by telephone and the availability of in person appointments. I also discussed with the patient that there may be a patient responsible charge related to this service. The patient expressed understanding and agreed to proceed.   I discussed the assessment and treatment plan with the patient. The patient was provided an opportunity to ask questions and all were answered. The patient agreed with the plan and demonstrated an understanding of the instructions.   The patient was advised to call back or seek an  in-person evaluation if the symptoms worsen or if the condition fails to improve as anticipated.  I provided 15 minutes of non-face-to-face time during this encounter.  Carlos American. Harle Battiest Avs printed and mailed

## 2022-01-31 NOTE — Progress Notes (Signed)
This service is provided via telemedicine  No vital signs collected/recorded due to the encounter was a telemedicine visit.   Location of patient (ex: home, work):  Home  Patient consents to a telephone visit:  Yes, see encounter 01/31/2022  Location of the provider (ex: office, home):  [place]  Name of any referring provider:  N/A  Names of all persons participating in the telemedicine service and their role in the encounter:  [name], Nurse Practitioner, Carroll Kinds, CMA, and patient.   Time spent on call:  11 minutes with medical assistant

## 2022-03-07 ENCOUNTER — Other Ambulatory Visit: Payer: Self-pay | Admitting: Cardiology

## 2022-03-07 DIAGNOSIS — I4819 Other persistent atrial fibrillation: Secondary | ICD-10-CM

## 2022-03-07 NOTE — Telephone Encounter (Signed)
Prescription refill request for Eliquis received. ?Indication:Afib  ?Last office visit:11/23/21 ([place]) ?Scr:0.76 (10/07/21) ?Age: 88 ?Weight: 83.6kg ? ?Appropriate dose and refill sent to requested pharmacy.  ?

## 2022-03-14 DIAGNOSIS — M79674 Pain in right toe(s): Secondary | ICD-10-CM | POA: Diagnosis not present

## 2022-03-14 DIAGNOSIS — M79675 Pain in left toe(s): Secondary | ICD-10-CM | POA: Diagnosis not present

## 2022-03-14 DIAGNOSIS — L84 Corns and callosities: Secondary | ICD-10-CM | POA: Diagnosis not present

## 2022-03-14 DIAGNOSIS — B351 Tinea unguium: Secondary | ICD-10-CM | POA: Diagnosis not present

## 2022-03-14 DIAGNOSIS — L603 Nail dystrophy: Secondary | ICD-10-CM | POA: Diagnosis not present

## 2022-03-14 DIAGNOSIS — E1151 Type 2 diabetes mellitus with diabetic peripheral angiopathy without gangrene: Secondary | ICD-10-CM | POA: Diagnosis not present

## 2022-03-21 ENCOUNTER — Ambulatory Visit (INDEPENDENT_AMBULATORY_CARE_PROVIDER_SITE_OTHER): Payer: Medicare HMO

## 2022-03-21 DIAGNOSIS — I495 Sick sinus syndrome: Secondary | ICD-10-CM

## 2022-03-21 LAB — CUP PACEART REMOTE DEVICE CHECK
Battery Remaining Longevity: 54 mo
Battery Remaining Percentage: 47 %
Battery Voltage: 2.98 V
Brady Statistic AP VP Percent: 6.6 %
Brady Statistic AP VS Percent: 93 %
Brady Statistic AS VP Percent: 1 %
Brady Statistic AS VS Percent: 1 %
Brady Statistic RA Percent Paced: 99 %
Brady Statistic RV Percent Paced: 6.6 %
Date Time Interrogation Session: 20230328025325
Implantable Lead Implant Date: 20170628
Implantable Lead Implant Date: 20170628
Implantable Lead Location: 753859
Implantable Lead Location: 753860
Implantable Pulse Generator Implant Date: 20170628
Lead Channel Impedance Value: 530 Ohm
Lead Channel Impedance Value: 560 Ohm
Lead Channel Pacing Threshold Amplitude: 0.75 V
Lead Channel Pacing Threshold Amplitude: 1.25 V
Lead Channel Pacing Threshold Pulse Width: 0.4 ms
Lead Channel Pacing Threshold Pulse Width: 0.4 ms
Lead Channel Sensing Intrinsic Amplitude: 1.3 mV
Lead Channel Sensing Intrinsic Amplitude: 7.6 mV
Lead Channel Setting Pacing Amplitude: 2 V
Lead Channel Setting Pacing Amplitude: 2.5 V
Lead Channel Setting Pacing Pulse Width: 0.4 ms
Lead Channel Setting Sensing Sensitivity: 2 mV
Pulse Gen Model: 2272
Pulse Gen Serial Number: 7910313

## 2022-03-28 ENCOUNTER — Other Ambulatory Visit: Payer: Self-pay | Admitting: Nurse Practitioner

## 2022-03-28 DIAGNOSIS — G4700 Insomnia, unspecified: Secondary | ICD-10-CM

## 2022-03-31 NOTE — Progress Notes (Signed)
Remote pacemaker transmission.   

## 2022-04-07 DIAGNOSIS — L570 Actinic keratosis: Secondary | ICD-10-CM | POA: Diagnosis not present

## 2022-04-07 DIAGNOSIS — C44519 Basal cell carcinoma of skin of other part of trunk: Secondary | ICD-10-CM | POA: Diagnosis not present

## 2022-04-07 DIAGNOSIS — L814 Other melanin hyperpigmentation: Secondary | ICD-10-CM | POA: Diagnosis not present

## 2022-04-07 DIAGNOSIS — D485 Neoplasm of uncertain behavior of skin: Secondary | ICD-10-CM | POA: Diagnosis not present

## 2022-04-23 ENCOUNTER — Other Ambulatory Visit: Payer: Self-pay | Admitting: Nurse Practitioner

## 2022-04-24 HISTORY — PX: SKIN SURGERY: SHX2413

## 2022-04-26 DIAGNOSIS — D225 Melanocytic nevi of trunk: Secondary | ICD-10-CM | POA: Diagnosis not present

## 2022-04-26 DIAGNOSIS — Z85828 Personal history of other malignant neoplasm of skin: Secondary | ICD-10-CM | POA: Diagnosis not present

## 2022-04-26 DIAGNOSIS — Z08 Encounter for follow-up examination after completed treatment for malignant neoplasm: Secondary | ICD-10-CM | POA: Diagnosis not present

## 2022-04-26 DIAGNOSIS — L814 Other melanin hyperpigmentation: Secondary | ICD-10-CM | POA: Diagnosis not present

## 2022-04-26 DIAGNOSIS — L821 Other seborrheic keratosis: Secondary | ICD-10-CM | POA: Diagnosis not present

## 2022-04-27 ENCOUNTER — Other Ambulatory Visit: Payer: Self-pay | Admitting: Nurse Practitioner

## 2022-04-27 NOTE — Telephone Encounter (Signed)
High risk warning populated when attempting to approve refill request. Will send to Lauree Chandler, NP to review.  ? ?

## 2022-05-10 NOTE — Progress Notes (Signed)
Cardiology Office Note   Date:  05/15/2022   ID:  [name], [name] [birth year], MRN [id number]  PCP:  Lauree Chandler, NP  Cardiologist:   Johne Buckle [place], MD   Chief Complaint  Patient presents with   Follow-up    6 months.   Shortness of Breath   Edema       History of Present Illness: [name] is a 88 y.o. female is seen for follow up Afib and dyspnea. She has a long history of marked sinus bradycardia and atrial fibrillation/flutter. She was seen initially in 2014. Echo at that time showed mild LAE otherwise normal. Holter showed mean HR 59 with lowest HR 43 and peak HR 109.    On March 9,2017 she noted an increased HR by BP monitor up to 153. At this time she felt marked indigestion from her waist to her neck. She felt her heart fluttering.   She was seen by Dr. Nyoka Cowden on 03/29/16 and found to be in Afib with RVR. She was started on Eliquis and metoprolol. Myoview study and Echo were normal.   She later had attempt at DCCV. She was in an atypical atrial flutter at that time. DCCV resulted in very prolonged pauses > 6 seconds and return to flutter. She was seen by Dr. Curt Bears and placed on flecainide. This did convert her to NSR but made her feel very sick with nausea, dizziness, and extreme fatigue. Flecainide was discontinued.. She continued to have marked bradycardia and underwent PPM placement on 06/21/16. When seen in September 2018 by Dr. Lovena Le she had an Afib burden of 94%. She was started on propafenone for her Afib. Initially this medication caused her to have more nausea but this has improved.  On her ubsequent  checks her Afib burden is less than 1% since December 2019.   Pacer check on 02/23/22 was normal.  She feels very well today. No chest pain or SOB. No edema.       Past Medical History:  Diagnosis Date   Allergic rhinitis due to pollen    Anxiety state, unspecified    Sitautional- pain   Arthritis    "right knee" (06/21/2016)   Asymptomatic  varicose veins    Carpal tunnel syndrome    Chest pain, unspecified    Chronic lower back pain    "on the left side" (06/21/2016)   Constipation    Cramp of limb    Depressive disorder, not elsewhere classified    Diaphragmatic hernia without mention of obstruction or gangrene    Diaphragmatic hernia without mention of obstruction or gangrene    Disturbance of skin sensation    Diverticulosis of colon (without mention of hemorrhage)    Dysrhythmia    Atrial Flutter   Enthesopathy of hip region    Esophageal reflux    , just occasional   History of blood transfusion    "w/both knee replacements"   History of duodenal ulcer    History of kidney stones    passed   Insomnia, unspecified    Lumbago    Migraine    "none since ~ 1990" (06/21/2016)   Myalgia and myositis, unspecified    Obesity, unspecified    Other abnormal blood chemistry    Other dysphagia    Other malaise and fatigue    Other nonspecific abnormal serum enzyme levels    Other specified cardiac dysrhythmias(427.89)    Pacemaker    Pain in joint, ankle and foot  Pain in joint, hand    Pain in joint, lower leg    Pain in joint, pelvic region and thigh    Pain in limb    Plantar fascial fibromatosis    Presence of permanent cardiac pacemaker    -St Jude   Reflux esophagitis    Sciatica    Spinal stenosis, unspecified region other than cervical    Stricture and stenosis of esophagus    Symptomatic menopausal or female climacteric states    Unspecified essential hypertension    Unspecified essential hypertension    Unspecified vitamin D deficiency     Past Surgical History:  Procedure Laterality Date   BREAST BIOPSY Left 1990s X 2   CARDIOVERSION N/A 04/26/2016   Procedure: CARDIOVERSION;  Surgeon: Pixie Casino, MD;  Location: [facility] ENDOSCOPY;  Service: Cardiovascular;  Laterality: N/A;   CATARACT EXTRACTION W/ INTRAOCULAR LENS IMPLANT Left 08/03/1999   DR EPES    CATARACT EXTRACTION W/ INTRAOCULAR LENS  IMPLANT Right 2000   DR EPES   CHOLECYSTECTOMY OPEN  1989   DR BOWMAN   COLONOSCOPY  1988   Normal    DENTAL SURGERY Left 08/2016   EP IMPLANTABLE DEVICE N/A 06/21/2016   Procedure: Pacemaker Implant;  Surgeon: Evans Lance, MD;  Location: [place] CV LAB;  Service: Cardiovascular;  Laterality: N/A;   ESOPHAGOGASTRODUODENOSCOPY (EGD) WITH ESOPHAGEAL DILATION  ~ 1982   Dr. Sharlett Iles   EXCISION OF ACTINIC KERATOSIS     DR LUPTON    EYE SURGERY     INSERT / REPLACE / REMOVE PACEMAKER     JOINT REPLACEMENT     KNEE ARTHROSCOPY Left 2003   KNEE ARTHROSCOPY Right 06-26-13   KNEE CLOSED REDUCTION Right 10/23/2013   Procedure: CLOSED MANIPULATION RIGHT KNEE;  Surgeon: Mcarthur Rossetti, MD;  Location: [place];  Service: Orthopedics;  Laterality: Right;   LASER FOR CLOUDY CAP LEFT EYE Left 03/2006   DR DIGBY   LUMBAR LAMINECTOMY/DECOMPRESSION MICRODISCECTOMY N/A 06/12/2019   Procedure: Laminectomy and Foraminotomy - Lumbar four-Lumbar five;  Surgeon: Eustace Moore, MD;  Location: [place];  Service: Neurosurgery;  Laterality: N/A;   [facility], Dr.Gray. Removed area on upper chest   TOTAL KNEE ARTHROPLASTY Left 04/2004   DR RENDALL   TOTAL KNEE ARTHROPLASTY Right 07/04/2013   Procedure: RIGHT TOTAL KNEE ARTHROPLASTY;  Surgeon: Mcarthur Rossetti, MD;  Location: [facility];  Service: Orthopedics;  Laterality: Right;   TRIGGER FINGER RELEASE Right 09/2018   VAGINAL HYSTERECTOMY  1979     Current Outpatient Medications  Medication Sig Dispense Refill   acetaminophen (TYLENOL) 500 MG tablet Take 500-2,000 mg by mouth daily.     antiseptic oral rinse (BIOTENE) LIQD 15 mLs by Mouth Rinse route daily.     Artificial Saliva (ACT DRY MOUTH) LOZG Use as directed 1 drop in the mouth or throat as needed (dry mouth).     bimatoprost (LUMIGAN) 0.01 % SOLN Place 1 drop into both eyes at bedtime.      Calcium Carb-Cholecalciferol (CALTRATE 600+D3) 600-800 MG-UNIT TABS 1  tablet twice daily 60 tablet 0   Cholecalciferol (VITAMIN D) 2000 UNITS tablet Take 2,000 Units by mouth daily.     denosumab (PROLIA) 60 MG/ML SOSY injection Inject 60 mg into the skin every 6 (six) months.     dorzolamide (TRUSOPT) 2 % ophthalmic solution Place 1 drop into both eyes 3 (three) times daily.     ELIQUIS 5  MG TABS tablet TAKE 1 TABLET BY MOUTH TWICE A DAY 180 tablet 1   esomeprazole (NEXIUM) 20 MG capsule TAKE 1 CAPSULE (20 MG TOTAL) BY MOUTH AS NEEDED. 90 capsule 1   ezetimibe (ZETIA) 10 MG tablet TAKE ONE TABLET BY MOUTH ONCE DAILY 90 tablet 1   methylcellulose (ARTIFICIAL TEARS) 1 % ophthalmic solution Place 1 drop into both eyes daily as needed (for dry eyes).      propafenone (RYTHMOL SR) 225 MG 12 hr capsule TAKE 1 CAPSULE BY MOUTH TWICE A DAY 180 capsule 2   traZODone (DESYREL) 50 MG tablet TAKE 1 TABLET BY MOUTH EVERYDAY AT BEDTIME 90 tablet 1   trolamine salicylate (ASPERCREME) 10 % cream Apply 1 application topically as needed for muscle pain (for knee pain).     valsartan-hydrochlorothiazide (DIOVAN-HCT) 320-25 MG tablet TAKE ONE TABLET BY MOUTH DAILY 90 tablet 1   metoprolol tartrate (LOPRESSOR) 25 MG tablet Take 1 tablet (25 mg total) by mouth 2 (two) times daily. 180 tablet 3   No current facility-administered medications for this visit.    Allergies:   Advil [ibuprofen], Aleve [naproxen sodium], Aspirin, Atorvastatin, Celebrex [celecoxib], Diclofenac, Doxycycline, Fosamax [alendronate sodium], Metoclopramide hcl, Nsaids, Other, Pravastatin, and Timolol    Social History:  The patient  reports that she has never smoked. She has never used smokeless tobacco. She reports that she does not drink alcohol and does not use drugs.   Family History:  The patient's family history includes Cerebrovascular Accident in her mother; Heart disease in her father; Hypertension in her brother; Obesity in her daughter; Ovarian cancer in her mother; Uterine cancer in her mother.     ROS:  Please see the history of present illness.   Otherwise, review of systems are positive for none.   All other systems are reviewed and negative.    PHYSICAL EXAM: VS:  BP 126/72 (BP Location: Left Arm, Patient Position: Sitting, Cuff Size: Normal)   Pulse 68   Ht '5\' 2"'$  (1.575 m)   Wt 185 lb (83.9 kg)   BMI 33.84 kg/m  , BMI Body mass index is 33.84 kg/m. GENERAL:  Well appearing, obese WF in NAD HEENT:  PERRL, EOMI, sclera are clear. Oropharynx is clear. NECK:  No jugular venous distention, carotid upstroke brisk and symmetric, no bruits, no thyromegaly or adenopathy LUNGS:  clear  CHEST:  Unremarkable HEART:  RRR,  PMI not displaced or sustained,S1 and S2 within normal limits, no S3, no S4: no clicks, no rubs, no murmurs ABD:  Soft, nontender. BS +, no masses or bruits. No hepatomegaly, no splenomegaly EXT:  2 + pulses throughout, no edema, no cyanosis no clubbing SKIN:  Warm and dry.  No rashes NEURO:  Alert and oriented x 3. Cranial nerves II through XII intact. PSYCH:  Cognitively intact    EKG:  EKG is not ordered today.    Recent Labs: 10/07/2021: ALT 19; BUN 20; Creat 0.76; Hemoglobin 12.3; Platelets 160; Potassium 3.8; Sodium 138    Lipid Panel    Component Value Date/Time   CHOL 143 04/11/2021 0958   CHOL 179 03/27/2016 0853   TRIG 179 (H) 04/11/2021 0958   HDL 37 (L) 04/11/2021 0958   HDL 42 03/27/2016 0853   CHOLHDL 3.9 04/11/2021 0958   VLDL 42 (H) 08/06/2017 0918   LDLCALC 78 04/11/2021 0958      Wt Readings from Last 3 Encounters:  05/15/22 185 lb (83.9 kg)  11/23/21 184 lb 6.4 oz (83.6 kg)  11/22/21 183 lb (83 kg)      Other studies Reviewed: Additional studies/ records that were reviewed today include:  Echo: 09/06/17: Study Conclusions   - Left ventricle: The cavity size was normal. Wall thickness was   increased in a pattern of mild LVH. Systolic function was normal.   The estimated ejection fraction was in the range of 55% to  60%.   Doppler parameters are consistent with both elevated ventricular   end-diastolic filling pressure and elevated left atrial filling   pressure. - Left atrium: The atrium was moderately dilated. - Atrial septum: There was increased thickness of the septum,   consistent with lipomatous hypertrophy. No defect or patent   foramen ovale was identified  Echo 10/7//21: IMPRESSIONS     1. Left ventricular ejection fraction, by estimation, is 55 to 60%. The  left ventricle has normal function. The left ventricle has no regional  wall motion abnormalities. There is mild left ventricular hypertrophy.  Left ventricular diastolic parameters  are consistent with Grade I diastolic dysfunction (impaired relaxation).  The E/e' is 46.   2. Right ventricular systolic function is normal. The right ventricular  size is normal.   3. Left atrial size was moderately dilated.   4. The mitral valve is grossly normal. No evidence of mitral valve  regurgitation.   5. The aortic valve is tricuspid. There is mild calcification of the  aortic valve. Aortic valve regurgitation is not visualized.   6. The inferior vena cava is normal in size with greater than 50%  respiratory variability, suggesting right atrial pressure of 3 mmHg.   Comparison(s): No significant change from prior study.   ASSESSMENT AND PLAN:  1.   Atrial fibrillation/flutter with RVR.  Intolerant of flecainide.  Now s/p PPM placement for marked post conversion pauses. On propafenone with marked improvement in Afib burden. Followed by Dr Lovena Le. Pacer check was normal.  Will continue same. Continue Eliquis for anticoagulation.   2. HTN well controlled.  3. Mild hypercholesterolemia.   Follow up in 6 months  Signed, Tayvion Lauder [place], [place]  05/15/2022 1:38 PM    [facility] [address], [place], [place], [address] Phone [phone], Fax [phone]

## 2022-05-15 ENCOUNTER — Ambulatory Visit: Payer: Medicare HMO | Admitting: Cardiology

## 2022-05-15 ENCOUNTER — Encounter: Payer: Self-pay | Admitting: Cardiology

## 2022-05-15 VITALS — BP 126/72 | HR 68 | Ht 62.0 in | Wt 185.0 lb

## 2022-05-15 DIAGNOSIS — Z950 Presence of cardiac pacemaker: Secondary | ICD-10-CM

## 2022-05-15 DIAGNOSIS — I495 Sick sinus syndrome: Secondary | ICD-10-CM | POA: Diagnosis not present

## 2022-05-15 DIAGNOSIS — I10 Essential (primary) hypertension: Secondary | ICD-10-CM | POA: Diagnosis not present

## 2022-05-15 DIAGNOSIS — I480 Paroxysmal atrial fibrillation: Secondary | ICD-10-CM

## 2022-05-23 DIAGNOSIS — M79674 Pain in right toe(s): Secondary | ICD-10-CM | POA: Diagnosis not present

## 2022-05-23 DIAGNOSIS — L84 Corns and callosities: Secondary | ICD-10-CM | POA: Diagnosis not present

## 2022-05-23 DIAGNOSIS — E1151 Type 2 diabetes mellitus with diabetic peripheral angiopathy without gangrene: Secondary | ICD-10-CM | POA: Diagnosis not present

## 2022-05-23 DIAGNOSIS — M79675 Pain in left toe(s): Secondary | ICD-10-CM | POA: Diagnosis not present

## 2022-05-23 DIAGNOSIS — B351 Tinea unguium: Secondary | ICD-10-CM | POA: Diagnosis not present

## 2022-05-29 DIAGNOSIS — H401131 Primary open-angle glaucoma, bilateral, mild stage: Secondary | ICD-10-CM | POA: Diagnosis not present

## 2022-05-29 DIAGNOSIS — H0102A Squamous blepharitis right eye, upper and lower eyelids: Secondary | ICD-10-CM | POA: Diagnosis not present

## 2022-05-29 DIAGNOSIS — H16223 Keratoconjunctivitis sicca, not specified as Sjogren's, bilateral: Secondary | ICD-10-CM | POA: Diagnosis not present

## 2022-05-29 DIAGNOSIS — H353131 Nonexudative age-related macular degeneration, bilateral, early dry stage: Secondary | ICD-10-CM | POA: Diagnosis not present

## 2022-06-05 DIAGNOSIS — L570 Actinic keratosis: Secondary | ICD-10-CM | POA: Diagnosis not present

## 2022-06-05 DIAGNOSIS — C44519 Basal cell carcinoma of skin of other part of trunk: Secondary | ICD-10-CM | POA: Diagnosis not present

## 2022-06-05 DIAGNOSIS — L989 Disorder of the skin and subcutaneous tissue, unspecified: Secondary | ICD-10-CM | POA: Diagnosis not present

## 2022-06-09 ENCOUNTER — Telehealth: Payer: Self-pay | Admitting: *Deleted

## 2022-06-09 NOTE — Telephone Encounter (Signed)
Called [employer] [phone] to obtain prior authorization for patient's Prolia.   Spoke with [employer] APPROVED 01/06/22-01/06/23. Authorization already on file.   Auth #: [id number]

## 2022-06-11 ENCOUNTER — Other Ambulatory Visit: Payer: Self-pay | Admitting: Cardiology

## 2022-06-20 ENCOUNTER — Ambulatory Visit (INDEPENDENT_AMBULATORY_CARE_PROVIDER_SITE_OTHER): Payer: Medicare HMO

## 2022-06-20 DIAGNOSIS — I495 Sick sinus syndrome: Secondary | ICD-10-CM

## 2022-06-20 LAB — CUP PACEART REMOTE DEVICE CHECK
Battery Remaining Longevity: 50 mo
Battery Remaining Percentage: 44 %
Battery Voltage: 2.96 V
Brady Statistic AP VP Percent: 6.3 %
Brady Statistic AP VS Percent: 93 %
Brady Statistic AS VP Percent: 1 %
Brady Statistic AS VS Percent: 1 %
Brady Statistic RA Percent Paced: 99 %
Brady Statistic RV Percent Paced: 6.4 %
Date Time Interrogation Session: 20230627020015
Implantable Lead Implant Date: 20170628
Implantable Lead Implant Date: 20170628
Implantable Lead Location: 753859
Implantable Lead Location: 753860
Implantable Pulse Generator Implant Date: 20170628
Lead Channel Impedance Value: 460 Ohm
Lead Channel Impedance Value: 550 Ohm
Lead Channel Pacing Threshold Amplitude: 0.75 V
Lead Channel Pacing Threshold Amplitude: 1.25 V
Lead Channel Pacing Threshold Pulse Width: 0.4 ms
Lead Channel Pacing Threshold Pulse Width: 0.4 ms
Lead Channel Sensing Intrinsic Amplitude: 0.9 mV
Lead Channel Sensing Intrinsic Amplitude: 8.4 mV
Lead Channel Setting Pacing Amplitude: 2 V
Lead Channel Setting Pacing Amplitude: 2.5 V
Lead Channel Setting Pacing Pulse Width: 0.4 ms
Lead Channel Setting Sensing Sensitivity: 2 mV
Pulse Gen Model: 2272
Pulse Gen Serial Number: 7910313

## 2022-06-26 ENCOUNTER — Telehealth: Payer: Self-pay | Admitting: Nurse Practitioner

## 2022-06-26 NOTE — Telephone Encounter (Signed)
Noted thank you

## 2022-06-26 NOTE — Telephone Encounter (Signed)
Pt canceled 7/18 Prolia appt due to cost greater than $250, due to donut hole.  Pt scheduled her 6 mo fu with provider for 7/24 and will discuss when she can afford Prolia again at that visit.

## 2022-06-29 ENCOUNTER — Ambulatory Visit: Payer: Medicare HMO | Admitting: Nurse Practitioner

## 2022-07-11 ENCOUNTER — Ambulatory Visit: Payer: Medicare HMO

## 2022-07-11 NOTE — Progress Notes (Signed)
Remote pacemaker transmission.   

## 2022-07-17 ENCOUNTER — Ambulatory Visit (INDEPENDENT_AMBULATORY_CARE_PROVIDER_SITE_OTHER): Payer: Medicare HMO | Admitting: Nurse Practitioner

## 2022-07-17 ENCOUNTER — Encounter: Payer: Self-pay | Admitting: Nurse Practitioner

## 2022-07-17 VITALS — BP 124/80 | HR 64 | Temp 97.1°F | Ht 62.0 in | Wt 190.0 lb

## 2022-07-17 DIAGNOSIS — I10 Essential (primary) hypertension: Secondary | ICD-10-CM

## 2022-07-17 DIAGNOSIS — M48062 Spinal stenosis, lumbar region with neurogenic claudication: Secondary | ICD-10-CM | POA: Diagnosis not present

## 2022-07-17 DIAGNOSIS — I480 Paroxysmal atrial fibrillation: Secondary | ICD-10-CM

## 2022-07-17 DIAGNOSIS — J302 Other seasonal allergic rhinitis: Secondary | ICD-10-CM | POA: Diagnosis not present

## 2022-07-17 DIAGNOSIS — M810 Age-related osteoporosis without current pathological fracture: Secondary | ICD-10-CM

## 2022-07-17 DIAGNOSIS — E785 Hyperlipidemia, unspecified: Secondary | ICD-10-CM | POA: Diagnosis not present

## 2022-07-17 MED ORDER — ALENDRONATE SODIUM 70 MG PO TABS: 70.0000 mg | ORAL_TABLET | ORAL | 11 refills | Status: DC

## 2022-07-17 NOTE — Progress Notes (Signed)
Careteam: Patient Care Team: Lauree Chandler, NP as PCP - General (Geriatric Medicine) [place], Peter M, MD as PCP - Cardiology (Cardiology) Mcarthur Rossetti, MD as Attending Physician (Orthopedic Surgery) Larey Dresser, MD as Attending Physician (Cardiology) Thornell Sartorius, MD as Consulting Physician (Otolaryngology) Druscilla Brownie, MD as Consulting Physician (Dermatology) Calvert Cantor, MD as Consulting Physician (Ophthalmology) Debara Pickett Nadean Corwin, MD as Consulting Physician (Cardiology) Eustace Moore, MD as Consulting Physician (Neurosurgery)  PLACE OF SERVICE:  [place]  Advanced Directive information Does Patient Have a Medical Advance Directive?: Yes, Type of Advance Directive: [place];Out of facility DNR (pink MOST or yellow form), Pre-existing out of facility DNR order (yellow form or pink MOST form): Pink MOST form placed in chart (order not valid for inpatient use);Yellow form placed in chart (order not valid for inpatient use), Does patient want to make changes to medical advance directive?: No - Patient declined  Allergies  Allergen Reactions  . Advil [Ibuprofen]     GI upset  . Aleve [Naproxen Sodium]     GI upset  . Aspirin Nausea And Vomiting    Takes baby aspirin qod without problems  . Atorvastatin     Leg cramps  . Celebrex [Celecoxib]     GI upset  . Diclofenac     GI upset  . Doxycycline     Headache  . Fosamax [Alendronate Sodium]     GI discomfort   . Metoclopramide Hcl     REACTION: heart palps  . Nsaids Other (See Comments)    Tears my stomach up   . Other     Antihistamine - increase BP  . Pravastatin     Leg cramps  . Timolol     Dropped HR    Chief Complaint  Patient presents with  . Medical Management of Chronic Issues    6 month follow-up. Discuss need for additional covid boosters or post pone if patient refuses. NCIR verified, Patient denies receiving any vaccines since last visit. Discuss  prolia and the doughnut hole. Moderate fall risk.      HPI: Patient is a 88 y.o. female for routine follow up.   Osteoporosis- has been taking prolia but this put her into the doughnut hole and all of her other medication was very expensive.   2 weeks ago her right ear has been popping. Feels like there is water in her ear.   Weight is up and she feels good but very fatigue. States she can do everything she did when she was 42 but slower.    Review of Systems:  ROS***  Past Medical History:  Diagnosis Date  . Allergic rhinitis due to pollen   . Anxiety state, unspecified    Sitautional- pain  . Arthritis    "right knee" (06/21/2016)  . Asymptomatic varicose veins   . Carpal tunnel syndrome   . Chest pain, unspecified   . Chronic lower back pain    "on the left side" (06/21/2016)  . Constipation   . Cramp of limb   . Depressive disorder, not elsewhere classified   . Diaphragmatic hernia without mention of obstruction or gangrene   . Diaphragmatic hernia without mention of obstruction or gangrene   . Disturbance of skin sensation   . Diverticulosis of colon (without mention of hemorrhage)   . Dysrhythmia    Atrial Flutter  . Enthesopathy of hip region   . Esophageal reflux    , just occasional  .  History of blood transfusion    "w/both knee replacements"  . History of duodenal ulcer   . History of kidney stones    passed  . Insomnia, unspecified   . Lumbago   . Migraine    "none since ~ 1990" (06/21/2016)  . Myalgia and myositis, unspecified   . Obesity, unspecified   . Other abnormal blood chemistry   . Other dysphagia   . Other malaise and fatigue   . Other nonspecific abnormal serum enzyme levels   . Other specified cardiac dysrhythmias(427.89)   . Pacemaker   . Pain in joint, ankle and foot   . Pain in joint, hand   . Pain in joint, lower leg   . Pain in joint, pelvic region and thigh   . Pain in limb   . Plantar fascial fibromatosis   . Presence of  permanent cardiac pacemaker    -St Jude  . Reflux esophagitis   . Sciatica   . Spinal stenosis, unspecified region other than cervical   . Stricture and stenosis of esophagus   . Symptomatic menopausal or female climacteric states   . Unspecified essential hypertension   . Unspecified essential hypertension   . Unspecified vitamin D deficiency    Past Surgical History:  Procedure Laterality Date  . BREAST BIOPSY Left 1990s X 2  . CARDIOVERSION N/A 04/26/2016   Procedure: CARDIOVERSION;  Surgeon: Pixie Casino, MD;  Location: [place];  Service: Cardiovascular;  Laterality: N/A;  . CATARACT EXTRACTION W/ INTRAOCULAR LENS IMPLANT Left 08/03/1999   DR EPES   . CATARACT EXTRACTION W/ INTRAOCULAR LENS IMPLANT Right 2000   DR EPES  . [place]  . COLONOSCOPY  1988   Normal   . DENTAL SURGERY Left 08/2016  . EP IMPLANTABLE DEVICE N/A 06/21/2016   Procedure: Pacemaker Implant;  Surgeon: Evans Lance, MD;  Location: [place] [facility];  Service: Cardiovascular;  Laterality: N/A;  . ESOPHAGOGASTRODUODENOSCOPY (EGD) WITH ESOPHAGEAL DILATION  ~ 1982   Dr. Sharlett Iles  . EXCISION OF ACTINIC KERATOSIS     DR LUPTON   . EYE SURGERY    . INSERT / REPLACE / REMOVE PACEMAKER    . JOINT REPLACEMENT    . KNEE ARTHROSCOPY Left 2003  . KNEE ARTHROSCOPY Right 06/26/2013  . KNEE CLOSED REDUCTION Right 10/23/2013   Procedure: CLOSED MANIPULATION RIGHT KNEE;  Surgeon: Mcarthur Rossetti, MD;  Location: [place];  Service: Orthopedics;  Laterality: Right;  . LASER FOR CLOUDY CAP LEFT EYE Left 03/2006   DR DIGBY  . LUMBAR LAMINECTOMY/DECOMPRESSION MICRODISCECTOMY N/A 06/12/2019   Procedure: Laminectomy and Foraminotomy - Lumbar four-Lumbar five;  Surgeon: Eustace Moore, MD;  Location: [place];  Service: Neurosurgery;  Laterality: N/A;  . [facility], Dr.Gray. Removed area on upper chest  . SKIN SURGERY     May 2023, upper left chest area,  [facility]  . TOTAL KNEE ARTHROPLASTY Left 04/2004   DR RENDALL  . TOTAL KNEE ARTHROPLASTY Right 07/04/2013   Procedure: RIGHT TOTAL KNEE ARTHROPLASTY;  Surgeon: Mcarthur Rossetti, MD;  [facility];  Service: Orthopedics;  Laterality: Right;  . TRIGGER FINGER RELEASE Right 09/2018  . VAGINAL HYSTERECTOMY  1979   Social History:   reports that she has never smoked. She has never used smokeless tobacco. She reports that she does not drink alcohol and does not use drugs.  Family  History  Problem Relation Age of Onset  . Ovarian cancer Mother   . Uterine cancer Mother   . Cerebrovascular Accident Mother   . Heart disease Father   . Hypertension Brother   . Obesity Daughter     Medications: Patient's Medications  New Prescriptions   No medications on file  Previous Medications   ACETAMINOPHEN (TYLENOL) 500 MG TABLET    Take 500-2,000 mg by mouth daily.   ANTISEPTIC ORAL RINSE (BIOTENE) LIQD    15 mLs by Mouth Rinse route daily.   ARTIFICIAL SALIVA (ACT DRY MOUTH) LOZG    Use as directed 1 drop in the mouth or throat as needed (dry mouth).   BIMATOPROST (LUMIGAN) 0.01 % SOLN    Place 1 drop into both eyes at bedtime.    CHOLECALCIFEROL (VITAMIN D) 2000 UNITS TABLET    Take 2,000 Units by mouth daily.   DENOSUMAB (PROLIA) 60 MG/ML SOSY INJECTION    Inject 60 mg into the skin every 6 (six) months.   DORZOLAMIDE (TRUSOPT) 2 % OPHTHALMIC SOLUTION    Place 1 drop into both eyes 3 (three) times daily.   ELIQUIS 5 MG TABS TABLET    TAKE 1 TABLET BY MOUTH TWICE A DAY   ESOMEPRAZOLE (NEXIUM) 20 MG CAPSULE    TAKE 1 CAPSULE (20 MG TOTAL) BY MOUTH AS NEEDED.   EZETIMIBE (ZETIA) 10 MG TABLET    TAKE ONE TABLET BY MOUTH ONCE DAILY   METHYLCELLULOSE (ARTIFICIAL TEARS) 1 % OPHTHALMIC SOLUTION    Place 1 drop into both eyes daily as needed (for dry eyes).    METOPROLOL TARTRATE (LOPRESSOR) 25 MG TABLET    Take 1 tablet (25 mg total) by mouth 2 (two) times daily.    PROPAFENONE (RYTHMOL SR) 225 MG 12 HR CAPSULE    TAKE 1 CAPSULE BY MOUTH TWICE A DAY   TRAZODONE (DESYREL) 50 MG TABLET    TAKE 1 TABLET BY MOUTH EVERYDAY AT BEDTIME   TROLAMINE SALICYLATE (ASPERCREME) 10 % CREAM    Apply 1 application topically as needed for muscle pain (for knee pain).   VALSARTAN-HYDROCHLOROTHIAZIDE (DIOVAN-HCT) 320-25 MG TABLET    TAKE ONE TABLET BY MOUTH DAILY  Modified Medications   No medications on file  Discontinued Medications   CALCIUM CARB-CHOLECALCIFEROL (CALTRATE 600+D3) 600-800 MG-UNIT TABS    1 tablet twice daily    Physical Exam:  Vitals:   07/17/22 1116  Temp: (!) 97.1 F (36.2 C)  TempSrc: Temporal  Weight: 190 lb (86.2 kg)  Height: '5\' 2"'$  (1.575 m)   Body mass index is 34.75 kg/m. Wt Readings from Last 3 Encounters:  07/17/22 190 lb (86.2 kg)  05/15/22 185 lb (83.9 kg)  11/23/21 184 lb 6.4 oz (83.6 kg)    Physical Exam***  Labs reviewed: Basic Metabolic Panel: Recent Labs    10/07/21 1347  NA 138  K 3.8  CL 105  CO2 28  GLUCOSE 90  BUN 20  CREATININE 0.76  CALCIUM 9.3   Liver Function Tests: Recent Labs    10/07/21 1347  AST 21  ALT 19  BILITOT 0.5  PROT 6.6   No results for input(s): "LIPASE", "AMYLASE" in the last 8760 hours. No results for input(s): "AMMONIA" in the last 8760 hours. CBC: Recent Labs    10/07/21 1347  WBC 4.9  NEUTROABS 2,377  HGB 12.3  HCT 37.4  MCV 92.8  PLT 160   Lipid Panel: No results for input(s): "CHOL", "HDL", "LDLCALC", "TRIG", "CHOLHDL", "  LDLDIRECT" in the last 8760 hours. TSH: No results for input(s): "TSH" in the last 8760 hours. A1C: Lab Results  Component Value Date   HGBA1C 5.2 08/06/2017     Assessment/Plan There are no diagnoses linked to this encounter.  No follow-ups on file.: *** Parissa Chiao K. [place], [facility] [phone]

## 2022-07-17 NOTE — Patient Instructions (Addendum)
Will stop prolia and restart fosamax weekly Recommended to take calcium 600 mg twice daily with Vitamin D 2000 units daily and weight bearing activity 30 mins/5 days a week  Zyrtec 10 mg daily (generic brand) for allergies.   Increase protein in diet-  can use nutritional supplements (make sure low in sugar)  Can use b12 supplement 1000 mcg daily (take in the morning)

## 2022-07-18 LAB — COMPLETE METABOLIC PANEL WITH GFR
AG Ratio: 1.4 (calc) (ref 1.0–2.5)
ALT: 16 U/L (ref 6–29)
AST: 16 U/L (ref 10–35)
Albumin: 3.9 g/dL (ref 3.6–5.1)
Alkaline phosphatase (APISO): 33 U/L — ABNORMAL LOW (ref 37–153)
BUN: 17 mg/dL (ref 7–25)
CO2: 26 mmol/L (ref 20–32)
Calcium: 9.2 mg/dL (ref 8.6–10.4)
Chloride: 103 mmol/L (ref 98–110)
Creat: 0.68 mg/dL (ref 0.60–0.95)
Globulin: 2.7 g/dL (calc) (ref 1.9–3.7)
Glucose, Bld: 101 mg/dL (ref 65–139)
Potassium: 3.7 mmol/L (ref 3.5–5.3)
Sodium: 139 mmol/L (ref 135–146)
Total Bilirubin: 0.4 mg/dL (ref 0.2–1.2)
Total Protein: 6.6 g/dL (ref 6.1–8.1)
eGFR: 83 mL/min/{1.73_m2} (ref >=60)

## 2022-07-18 LAB — CBC WITH DIFFERENTIAL/PLATELET
Absolute Monocytes: 480 cells/uL (ref 200–950)
Basophils Absolute: 42 cells/uL (ref 0–200)
Basophils Relative: 0.7 %
Eosinophils Absolute: 132 cells/uL (ref 15–500)
Eosinophils Relative: 2.2 %
HCT: 38.7 % (ref 35.0–45.0)
Hemoglobin: 13 g/dL (ref 11.7–15.5)
Lymphs Abs: 2052 cells/uL (ref 850–3900)
MCH: 31 pg (ref 27.0–33.0)
MCHC: 33.6 g/dL (ref 32.0–36.0)
MCV: 92.1 fL (ref 80.0–100.0)
MPV: 10 fL (ref 7.5–12.5)
Monocytes Relative: 8 %
Neutro Abs: 3294 cells/uL (ref 1500–7800)
Neutrophils Relative %: 54.9 %
Platelets: 190 10*3/uL (ref 140–400)
RBC: 4.2 10*6/uL (ref 3.80–5.10)
RDW: 13 % (ref 11.0–15.0)
Total Lymphocyte: 34.2 %
WBC: 6 10*3/uL (ref 3.8–10.8)

## 2022-07-18 LAB — LIPID PANEL
Cholesterol: 188 mg/dL (ref <200)
HDL: 36 mg/dL — ABNORMAL LOW (ref >=50)
LDL Cholesterol (Calc): 106 mg/dL (calc) — ABNORMAL HIGH
Non-HDL Cholesterol (Calc): 152 mg/dL (calc) — ABNORMAL HIGH (ref <130)
Total CHOL/HDL Ratio: 5.2 (calc) — ABNORMAL HIGH (ref <5.0)
Triglycerides: 347 mg/dL — ABNORMAL HIGH (ref <150)

## 2022-07-18 LAB — TSH: TSH: 1.76 mIU/L (ref 0.40–4.50)

## 2022-07-20 DIAGNOSIS — M79675 Pain in left toe(s): Secondary | ICD-10-CM | POA: Diagnosis not present

## 2022-07-20 DIAGNOSIS — B351 Tinea unguium: Secondary | ICD-10-CM | POA: Diagnosis not present

## 2022-07-20 DIAGNOSIS — L84 Corns and callosities: Secondary | ICD-10-CM | POA: Diagnosis not present

## 2022-07-20 DIAGNOSIS — E1151 Type 2 diabetes mellitus with diabetic peripheral angiopathy without gangrene: Secondary | ICD-10-CM | POA: Diagnosis not present

## 2022-07-20 DIAGNOSIS — M79674 Pain in right toe(s): Secondary | ICD-10-CM | POA: Diagnosis not present

## 2022-08-24 DIAGNOSIS — E1151 Type 2 diabetes mellitus with diabetic peripheral angiopathy without gangrene: Secondary | ICD-10-CM | POA: Diagnosis not present

## 2022-08-24 DIAGNOSIS — S9032XA Contusion of left foot, initial encounter: Secondary | ICD-10-CM | POA: Diagnosis not present

## 2022-08-24 DIAGNOSIS — M79675 Pain in left toe(s): Secondary | ICD-10-CM | POA: Diagnosis not present

## 2022-08-24 DIAGNOSIS — L84 Corns and callosities: Secondary | ICD-10-CM | POA: Diagnosis not present

## 2022-08-24 DIAGNOSIS — M79674 Pain in right toe(s): Secondary | ICD-10-CM | POA: Diagnosis not present

## 2022-08-24 DIAGNOSIS — B351 Tinea unguium: Secondary | ICD-10-CM | POA: Diagnosis not present

## 2022-08-31 ENCOUNTER — Ambulatory Visit (INDEPENDENT_AMBULATORY_CARE_PROVIDER_SITE_OTHER): Payer: Medicare HMO | Admitting: Adult Health

## 2022-08-31 ENCOUNTER — Encounter: Payer: Self-pay | Admitting: Adult Health

## 2022-08-31 VITALS — BP 128/84 | HR 62 | Temp 97.1°F | Ht 62.0 in | Wt 189.4 lb

## 2022-08-31 DIAGNOSIS — I480 Paroxysmal atrial fibrillation: Secondary | ICD-10-CM | POA: Diagnosis not present

## 2022-08-31 DIAGNOSIS — R600 Localized edema: Secondary | ICD-10-CM | POA: Diagnosis not present

## 2022-08-31 DIAGNOSIS — R21 Rash and other nonspecific skin eruption: Secondary | ICD-10-CM

## 2022-08-31 DIAGNOSIS — I10 Essential (primary) hypertension: Secondary | ICD-10-CM | POA: Diagnosis not present

## 2022-08-31 MED ORDER — BETAMETHASONE DIPROPIONATE 0.05 % EX OINT: TOPICAL_OINTMENT | Freq: Two times a day (BID) | CUTANEOUS | 0 refills | Status: AC

## 2022-08-31 NOTE — Progress Notes (Signed)
[facility] clinic  Provider:   Durenda Age DNP  Code Status:  DNR  Goals of Care:     07/17/2022   11:13 AM  Advanced Directives  Does Patient Have a Medical Advance Directive? Yes  Type of Paramedic of Preemption;Out of facility DNR (pink MOST or yellow form)  Does patient want to make changes to medical advance directive? No - Patient declined  Copy of [place] in Chart? Yes - validated most recent copy scanned in chart (See row information)  Pre-existing out of facility DNR order (yellow form or pink MOST form) Pink MOST form placed in chart (order not valid for inpatient use);Yellow form placed in chart (order not valid for inpatient use)     Chief Complaint  Patient presents with   Acute Visit    Rash on Left leg with Swelling. Feels like bee's are stinging her.     HPI: Patient is a 88 y.o. female seen today for an acute visit for rash on left leg with swelling. She has PMH of allergic rhinitis, anxiety, esophageal reflux, duodenal ulcer and arthritis. She was accompanied today by her husband. Two weeks ago, after a morning walk, she noted a "spot" at the bottom of her left foot. She then noted bruise on the side of her left foot next day. Podiatry has ordered x-ray and was negative for fracture.Yesterday, she noticed she started having erythematous rashes on her left shin. She said that the left shin feels like "bee stings". Husband stated that she has swelling of left foot was already present before the rashes. No bruising noted on left foot today.  She takes Eliquis and Metoprolol tartrate for PAF  BP 128/84, takes Valsartan-HCTZ for hypertension.    Past Medical History:  Diagnosis Date   Allergic rhinitis due to pollen    Anxiety state, unspecified    Sitautional- pain   Arthritis    "right knee" (06/21/2016)   Asymptomatic varicose veins    Carpal tunnel syndrome    Chest pain, unspecified    Chronic lower back pain     "on the left side" (06/21/2016)   Constipation    Cramp of limb    Depressive disorder, not elsewhere classified    Diaphragmatic hernia without mention of obstruction or gangrene    Diaphragmatic hernia without mention of obstruction or gangrene    Disturbance of skin sensation    Diverticulosis of colon (without mention of hemorrhage)    Dysrhythmia    Atrial Flutter   Enthesopathy of hip region    Esophageal reflux    , just occasional   History of blood transfusion    "w/both knee replacements"   History of duodenal ulcer    History of kidney stones    passed   Insomnia, unspecified    Lumbago    Migraine    "none since ~ 1990" (06/21/2016)   Myalgia and myositis, unspecified    Obesity, unspecified    Other abnormal blood chemistry    Other dysphagia    Other malaise and fatigue    Other nonspecific abnormal serum enzyme levels    Other specified cardiac dysrhythmias(427.89)    Pacemaker    Pain in joint, ankle and foot    Pain in joint, hand    Pain in joint, lower leg    Pain in joint, pelvic region and thigh    Pain in limb    Plantar fascial fibromatosis    Presence  of permanent cardiac pacemaker    -St Jude   Reflux esophagitis    Sciatica    Spinal stenosis, unspecified region other than cervical    Stricture and stenosis of esophagus    Symptomatic menopausal or female climacteric states    Unspecified essential hypertension    Unspecified essential hypertension    Unspecified vitamin D deficiency     Past Surgical History:  Procedure Laterality Date   BREAST BIOPSY Left 1990s X 2   CARDIOVERSION N/A 04/26/2016   Procedure: CARDIOVERSION;  Surgeon: Pixie Casino, MD;  Location: [facility] ENDOSCOPY;  Service: Cardiovascular;  Laterality: N/A;   CATARACT EXTRACTION W/ INTRAOCULAR LENS IMPLANT Left 08/03/1999   DR EPES    CATARACT EXTRACTION W/ INTRAOCULAR LENS IMPLANT Right 2000   DR EPES   CHOLECYSTECTOMY OPEN  1989   DR BOWMAN   COLONOSCOPY  1988    Normal    DENTAL SURGERY Left 08/2016   EP IMPLANTABLE DEVICE N/A 06/21/2016   Procedure: Pacemaker Implant;  Surgeon: Evans Lance, MD;  Location: [place] [facility];  Service: Cardiovascular;  Laterality: N/A;   ESOPHAGOGASTRODUODENOSCOPY (EGD) WITH ESOPHAGEAL DILATION  ~ 1982   Dr. Sharlett Iles   EXCISION OF ACTINIC KERATOSIS     DR LUPTON    EYE SURGERY     INSERT / [facility] / REMOVE PACEMAKER     JOINT REPLACEMENT     KNEE ARTHROSCOPY Left 2003   KNEE ARTHROSCOPY Right 06/26/2013   KNEE CLOSED REDUCTION Right 10/23/2013   Procedure: CLOSED MANIPULATION RIGHT KNEE;  Surgeon: Mcarthur Rossetti, MD;  Location: [place];  Service: Orthopedics;  Laterality: Right;   LASER FOR CLOUDY CAP LEFT EYE Left 03/2006   DR DIGBY   LUMBAR LAMINECTOMY/DECOMPRESSION MICRODISCECTOMY N/A 06/12/2019   Procedure: Laminectomy and Foraminotomy - Lumbar four-Lumbar five;  Surgeon: Eustace Moore, MD;  Location: [place];  Service: Neurosurgery;  Laterality: N/A;   [facility], Dr.Gray. Removed area on upper chest   SKIN SURGERY     May 2023, upper left chest area, perfored by the skin surgery center   TOTAL KNEE ARTHROPLASTY Left 04/2004   DR RENDALL   TOTAL KNEE ARTHROPLASTY Right 07/04/2013   Procedure: RIGHT TOTAL KNEE ARTHROPLASTY;  Surgeon: Mcarthur Rossetti, MD;  Location: [facility];  Service: Orthopedics;  Laterality: Right;   TRIGGER FINGER RELEASE Right 09/2018   VAGINAL HYSTERECTOMY  1979    Allergies  Allergen Reactions   Advil [Ibuprofen]     GI upset   Aleve [Naproxen Sodium]     GI upset   Aspirin Nausea And Vomiting    Takes baby aspirin qod without problems   Atorvastatin     Leg cramps   Celebrex [Celecoxib]     GI upset   Diclofenac     GI upset   Doxycycline     Headache   Fosamax [Alendronate Sodium]     GI discomfort    Metoclopramide Hcl     REACTION: heart palps   Nsaids Other (See Comments)    Tears my stomach up    Other      Antihistamine - increase BP   Pravastatin     Leg cramps   Timolol     Dropped HR    Outpatient Encounter Medications as of 08/31/2022  Medication Sig   acetaminophen (TYLENOL) 500 MG tablet Take 500-2,000 mg by mouth daily.   alendronate (FOSAMAX) 70 MG tablet Take 1  tablet (70 mg total) by mouth every 7 (seven) days. Take with a full glass of water on an empty stomach.   antiseptic oral rinse (BIOTENE) LIQD 15 mLs by Mouth Rinse route daily.   Artificial Saliva (ACT DRY MOUTH) LOZG Use as directed 1 drop in the mouth or throat as needed (dry mouth).   betamethasone dipropionate (DIPROLENE) 0.05 % ointment Apply topically 2 (two) times daily for 14 days.   bimatoprost (LUMIGAN) 0.01 % SOLN Place 1 drop into both eyes at bedtime.    Cholecalciferol (VITAMIN D) 2000 UNITS tablet Take 2,000 Units by mouth daily.   dorzolamide (TRUSOPT) 2 % ophthalmic solution Place 1 drop into both eyes 3 (three) times daily.   ELIQUIS 5 MG TABS tablet TAKE 1 TABLET BY MOUTH TWICE A DAY   esomeprazole (NEXIUM) 20 MG capsule TAKE 1 CAPSULE (20 MG TOTAL) BY MOUTH AS NEEDED.   ezetimibe (ZETIA) 10 MG tablet TAKE ONE TABLET BY MOUTH ONCE DAILY   methylcellulose (ARTIFICIAL TEARS) 1 % ophthalmic solution Place 1 drop into both eyes daily as needed (for dry eyes).    metoprolol tartrate (LOPRESSOR) 25 MG tablet Take 1 tablet (25 mg total) by mouth 2 (two) times daily.   propafenone (RYTHMOL SR) 225 MG 12 hr capsule TAKE 1 CAPSULE BY MOUTH TWICE A DAY   traZODone (DESYREL) 50 MG tablet TAKE 1 TABLET BY MOUTH EVERYDAY AT BEDTIME   trolamine salicylate (ASPERCREME) 10 % cream Apply 1 application topically as needed for muscle pain (for knee pain).   valsartan-hydrochlorothiazide (DIOVAN-HCT) 320-25 MG tablet TAKE ONE TABLET BY MOUTH DAILY   No facility-administered encounter medications on file as of 08/31/2022.    Review of Systems:  Review of Systems  Constitutional:  Negative for appetite change, chills, fatigue  and fever.  HENT:  Negative for congestion, hearing loss, rhinorrhea and sore throat.   Eyes: Negative.   Respiratory:  Negative for cough, shortness of breath and wheezing.   Cardiovascular:  Positive for leg swelling. Negative for chest pain and palpitations.  Gastrointestinal:  Negative for abdominal pain, constipation, diarrhea, nausea and vomiting.  Genitourinary:  Negative for dysuria.  Musculoskeletal:  Negative for arthralgias, back pain and myalgias.  Skin:  Positive for rash. Negative for color change and wound.  Neurological:  Negative for dizziness, weakness and headaches.  Psychiatric/Behavioral:  Negative for behavioral problems. The patient is not nervous/anxious.     Health Maintenance  Topic Date Due   COVID-19 Vaccine (4 - Pfizer risk series) 11/24/2020   MAMMOGRAM  10/05/2021   INFLUENZA VACCINE  07/25/2022   TETANUS/TDAP  06/25/2027   Pneumonia Vaccine 9+ Years old  Completed   DEXA SCAN  Completed   Zoster Vaccines- Shingrix  Completed   HPV VACCINES  Aged Out    Physical Exam: Vitals:   08/31/22 1308  BP: 128/84  Pulse: 62  Temp: (!) 97.1 F (36.2 C)  TempSrc: Skin  SpO2: 97%  Weight: 189 lb 6.4 oz (85.9 kg)  Height: '5\' 2"'$  (1.575 m)   Body mass index is 34.64 kg/m. Physical Exam Constitutional:      General: She is not in acute distress.    Appearance: She is obese.  HENT:     Head: Normocephalic and atraumatic.     Nose: Nose normal.     Mouth/Throat:     Mouth: Mucous membranes are moist.  Eyes:     Conjunctiva/sclera: Conjunctivae normal.  Cardiovascular:     Rate and Rhythm: Normal  rate and regular rhythm.  Pulmonary:     Effort: Pulmonary effort is normal.     Breath sounds: Normal breath sounds.  Abdominal:     General: Bowel sounds are normal.     Palpations: Abdomen is soft.  Musculoskeletal:        General: Normal range of motion.     Cervical back: Normal range of motion.     Left lower leg: Edema present.     Comments:  Left foot has 1+ edema.  Skin:    General: Skin is warm and dry.     Comments: Erythematous rashes on left lower leg.  Neurological:     General: No focal deficit present.     Mental Status: She is alert. She is disoriented.     Comments: Alert to person and place, disoriented to time.  Psychiatric:        Mood and Affect: Mood normal.        Behavior: Behavior normal.        Thought Content: Thought content normal.        Judgment: Judgment normal.     Labs reviewed: Basic Metabolic Panel: Recent Labs    10/07/21 1347 07/17/22 1436  NA 138 139  K 3.8 3.7  CL 105 103  CO2 28 26  GLUCOSE 90 101  BUN 20 17  CREATININE 0.76 0.68  CALCIUM 9.3 9.2  TSH  --  1.76   Liver Function Tests: Recent Labs    10/07/21 1347 07/17/22 1436  AST 21 16  ALT 19 16  BILITOT 0.5 0.4  PROT 6.6 6.6   No results for input(s): "LIPASE", "AMYLASE" in the last 8760 hours. No results for input(s): "AMMONIA" in the last 8760 hours. CBC: Recent Labs    10/07/21 1347 07/17/22 1436  WBC 4.9 6.0  NEUTROABS 2,377 3,294  HGB 12.3 13.0  HCT 37.4 38.7  MCV 92.8 92.1  PLT 160 190   Lipid Panel: Recent Labs    07/17/22 1436  CHOL 188  HDL 36*  LDLCALC 106*  TRIG 347*  CHOLHDL 5.2*   Lab Results  Component Value Date   HGBA1C 5.2 08/06/2017    Procedures since last visit: No results found.   Assessment/Plan  1. Lower extremity edema -  she is currently taking Eliquis but will do the vascular ultrasound to rule out DVT - VAS [place] LOWER EXTREMITY VENOUS (DVT); Future  2. Rash and nonspecific skin eruption -  moisturize skin to prevent dryness - betamethasone dipropionate (DIPROLENE) 0.05 % ointment; Apply topically 2 (two) times daily for 14 days.  Dispense: 30 g; Refill: 0  3. Paroxysmal atrial fibrillation (HCC) -  rate-controlled, continue Eliquis and Metoprolol tartrate  4. Essential hypertension -  Blood pressure well controlled Continue current  medications   Labs/tests ordered:  VAS [place] LOWER EXTREMITY VENOUS (DVT)  Next appt:  12/11/2022

## 2022-08-31 NOTE — Patient Instructions (Signed)
Edema  Edema is an abnormal buildup of fluids in the body tissues and under the skin. Swelling of the legs, feet, and ankles is a common symptom that becomes more likely as you get older. Swelling is also common in looser tissues, such as around the eyes. Pressing on the area may make a temporary dent in your skin (pitting edema). This fluid may also accumulate in your lungs (pulmonary edema). There are many possible causes of edema. Eating too much salt (sodium) and being on your feet or sitting for a long time can cause edema in your legs, feet, and ankles. Common causes of edema include: Certain medical conditions, such as heart failure, liver or kidney disease, and cancer. Weak leg blood vessels. An injury. Pregnancy. Medicines. Being obese. Low protein levels in the blood. Hot weather may make edema worse. Edema is usually painless. Your skin may look swollen or shiny. Follow these instructions at home: Medicines Take over-the-counter and prescription medicines only as told by your health care provider. Your health care provider may prescribe a medicine to help your body get rid of extra water (diuretic). Take this medicine if you are told to take it. Eating and drinking Eat a low-salt (low-sodium) diet to reduce fluid as told by your health care provider. Sometimes, eating less salt may reduce swelling. Depending on the cause of your swelling, you may need to limit how much fluid you drink (fluid restriction). General instructions Raise (elevate) the injured area above the level of your heart while you are sitting or lying down. Do not sit still or stand for long periods of time. Do not wear tight clothing. Do not wear garters on your upper legs. Exercise your legs to get your circulation going. This helps to move the fluid back into your blood vessels, and it may help the swelling go down. Wear compression stockings as told by your health care provider. These stockings help to prevent  blood clots and reduce swelling in your legs. It is important that these are the correct size. These stockings should be prescribed by your health care provider to prevent possible injuries. If elastic bandages or wraps are recommended, use them as told by your health care provider. Contact a health care provider if: Your edema does not get better with treatment. You have heart, liver, or kidney disease and have symptoms of edema. You have sudden and unexplained weight gain. Get help right away if: You develop shortness of breath or chest pain. You cannot breathe when you lie down. You develop pain, redness, or warmth in the swollen areas. You have heart, liver, or kidney disease and suddenly get edema. You have a fever and your symptoms suddenly get worse. These symptoms may be an emergency. Get help right away. Call 911. Do not wait to see if the symptoms will go away. Do not drive yourself to the hospital. Summary Edema is an abnormal buildup of fluids in the body tissues and under the skin. Eating too much salt (sodium)and being on your feet or sitting for a long time can cause edema in your legs, feet, and ankles. Raise (elevate) the injured area above the level of your heart while you are sitting or lying down. Follow your health care provider's instructions about diet and how much fluid you can drink. This information is not intended to replace advice given to you by your health care provider. Make sure you discuss any questions you have with your health care provider. Document Revised: 08/15/2021 Document   Reviewed: 08/15/2021 Elsevier Patient Education  2023 Elsevier Inc.  

## 2022-09-04 ENCOUNTER — Ambulatory Visit (HOSPITAL_COMMUNITY)
Admission: RE | Admit: 2022-09-04 | Discharge: 2022-09-04 | Disposition: A | Discharge status: Home / Self Care | Payer: Medicare HMO | Source: Ambulatory Visit | Attending: Internal Medicine | Admitting: Internal Medicine

## 2022-09-04 DIAGNOSIS — R600 Localized edema: Secondary | ICD-10-CM | POA: Diagnosis not present

## 2022-09-04 NOTE — Progress Notes (Signed)
Ultrasound was negative for DVT.

## 2022-09-18 ENCOUNTER — Ambulatory Visit: Payer: Medicare HMO | Admitting: Physician Assistant

## 2022-09-18 ENCOUNTER — Ambulatory Visit (INDEPENDENT_AMBULATORY_CARE_PROVIDER_SITE_OTHER): Payer: Medicare HMO

## 2022-09-18 ENCOUNTER — Encounter: Payer: Self-pay | Admitting: Physician Assistant

## 2022-09-18 DIAGNOSIS — M25572 Pain in left ankle and joints of left foot: Secondary | ICD-10-CM

## 2022-09-18 DIAGNOSIS — M7061 Trochanteric bursitis, right hip: Secondary | ICD-10-CM

## 2022-09-18 MED ORDER — METHYLPREDNISOLONE ACETATE 40 MG/ML IJ SUSP
40.0000 mg | INTRAMUSCULAR | Status: AC | PRN
  Administered 2022-09-18: 40 mg via INTRA_ARTICULAR

## 2022-09-18 MED ORDER — LIDOCAINE HCL 1 % IJ SOLN
3.0000 mL | INTRAMUSCULAR | Status: AC | PRN
  Administered 2022-09-18: 3 mL

## 2022-09-18 NOTE — Progress Notes (Addendum)
Office Visit Note   Patient: [name]           Date of Birth: 1931/04/17           MRN: [id number] Visit Date: 09/18/2022              Requested by: Lauree Chandler, NP [place],  [place] [id number] PCP: Lauree Chandler, NP   Assessment & Plan: Visit Diagnoses:  1. Pain in left ankle and joints of left foot   2. Trochanteric bursitis, right hip     Plan: Recommend compression hose for the left leg to be worn during the day.  She is given a metatarsal pad to placed just proximal to the second metatarsal head.  Discussed shoe wear with her.  Discussed IT band stretching for the right hip trochanteric bursitis.  She will follow-up with [place] on an as-needed basis pain persist or comes worse.  Questions were encouraged and  Follow-Up Instructions: Return if symptoms worsen or fail to improve.   Orders:  Orders Placed This Encounter  Procedures   Large Joint Inj   XR Ankle Complete Left   XR Foot Complete Left   No orders of the defined types were placed in this encounter.     Procedures: Large Joint Inj: R greater trochanter on 09/18/2022 5:30 PM Indications: pain Details: 22 G 1.5 in needle, lateral approach  Arthrogram: No  Medications: 3 mL lidocaine 1 %; 40 mg methylPREDNISolone acetate 40 MG/ML Outcome: tolerated well, no immediate complications Procedure, treatment alternatives, risks and benefits explained, specific risks discussed. Consent was given by the patient. Immediately prior to procedure a time out was called to verify the correct patient, procedure, equipment, support staff and site/side marked as required. Patient was prepped and draped in the usual sterile fashion.       Clinical Data: No additional findings.   Subjective: Chief Complaint  Patient presents with   Left Leg - Pain   Right Hip - Pain    HPI [name] comes in today due to left leg swelling pain.  She states that started a month ago after going on a walk.   She has had swelling down the left foot and ankle.  Now having more pain in the left ankle and foot.  She notes she had some bruising anterior proximal tib-fib that moved into the calf area underwent an ultrasound on 09/04/2022 and this is negative for DVT.  She states she had stinging at the proximal tibia and rash-like that she was given betamethasone to place on the area and this is helped.  She also notes bruising in her foot.  She is on long-term anticoagulants.  She notes that the left leg continues to be swollen.  She is also having pain in the right lateral hip.  No real radicular symptoms down the leg.  Review of Systems  Constitutional:  Negative for chills and fever.     Objective: Vital Signs: There were no vitals taken for this visit.  Physical Exam Constitutional:      Appearance: She is not ill-appearing or diaphoretic.  Pulmonary:     Effort: Pulmonary effort is normal.  Neurological:     Mental Status: She is alert and oriented to person, place, and time.  Psychiatric:        Mood and Affect: Mood normal.     Ortho Exam Left leg no gross deformity.  Areas of varicose veins.  No significant rashes  skin lesions ulcerations.  Slight pitting edema of the left leg compared to the right.  Calf is nontender.  Tenderness over the second metatarsal head region of the left foot plantar: Achilles is intact and nontender.  Remainder left foot ankle normal exam. Right hip trochanteric tenderness.  Good range of motion right hip otherwise without pain. Specialty Comments:  No specialty comments available.  Imaging: XR Ankle Complete Left  Result Date: 09/18/2022 Right ankle 3 views talus well located within the ankle mortise no diastases.  No acute fractures bony abnormalities.  XR Foot Complete Left  Result Date: 09/18/2022 Left foot 3 views: No acute fractures.  No subluxations dislocations.  Os trigonum and heel spur noted.  Otherwise no acute findings.    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   Knee joint replacement by other means 10/01/2013   Arthritis of knee, right 07/04/2013   Osteoarthritis of right knee 07/01/2013   Bradycardia 05/13/2013   HYPERCHOLESTEROLEMIA 06/02/2009   Essential hypertension 06/02/2009   ESOPHAGEAL STRICTURE 06/02/2009   GERD 06/02/2009   HIATAL HERNIA 06/02/2009   DIVERTICULOSIS, COLON 06/02/2009   Dysphagia, pharyngoesophageal phase 06/02/2009   Backache 05/01/2007   LEG PAIN, RIGHT 05/01/2007   Past Medical History:  Diagnosis Date   Allergic rhinitis due to pollen    Anxiety state, unspecified    Sitautional- pain   Arthritis    "right knee" (06/21/2016)   Asymptomatic varicose veins    Carpal tunnel syndrome    Chest pain, unspecified    Chronic lower back pain    "on the left side" (06/21/2016)   Constipation    Cramp of limb    Depressive disorder, not elsewhere classified    Diaphragmatic hernia without mention of obstruction or gangrene    Diaphragmatic hernia without mention of obstruction or gangrene    Disturbance of skin sensation    Diverticulosis of colon (without mention of hemorrhage)    Dysrhythmia    Atrial Flutter   Enthesopathy of hip region    Esophageal reflux    , just occasional   History of blood transfusion    "w/both knee replacements"   History of duodenal ulcer    History of kidney stones    passed   Insomnia, unspecified    Lumbago    Migraine    "none since ~ 1990" (06/21/2016)   Myalgia and myositis, unspecified    Obesity, unspecified     Other abnormal blood chemistry    Other dysphagia    Other malaise and fatigue    Other nonspecific abnormal serum enzyme levels    Other specified cardiac dysrhythmias(427.89)    Pacemaker    Pain in joint, ankle and foot    Pain in joint, hand    Pain in joint, lower leg    Pain in joint, pelvic region and thigh    Pain in limb    Plantar fascial fibromatosis    Presence of permanent cardiac pacemaker    -St Jude   Reflux esophagitis    Sciatica    Spinal  stenosis, unspecified region other than cervical    Stricture and stenosis of esophagus    Symptomatic menopausal or female climacteric states    Unspecified essential hypertension    Unspecified essential hypertension    Unspecified vitamin D deficiency     Family History  Problem Relation Age of Onset   Ovarian cancer Mother    Uterine cancer Mother    Cerebrovascular Accident Mother    Heart disease Father    Hypertension Brother    Obesity Daughter     Past Surgical History:  Procedure Laterality Date   BREAST BIOPSY Left 1990s X 2   CARDIOVERSION N/A 04/26/2016   Procedure: CARDIOVERSION;  Surgeon: Pixie Casino, MD;  Location: [place];  Service: Cardiovascular;  Laterality: N/A;   CATARACT EXTRACTION W/ INTRAOCULAR LENS IMPLANT Left 08/03/1999   DR EPES    CATARACT EXTRACTION W/ INTRAOCULAR LENS IMPLANT Right 2000   DR EPES   CHOLECYSTECTOMY OPEN  1989   DR BOWMAN   COLONOSCOPY  1988   Normal    DENTAL SURGERY Left 08/2016   EP IMPLANTABLE DEVICE N/A 06/21/2016   Procedure: Pacemaker Implant;  Surgeon: Evans Lance, MD;  Location: [place] [facility];  Service: Cardiovascular;  Laterality: N/A;   ESOPHAGOGASTRODUODENOSCOPY (EGD) WITH ESOPHAGEAL DILATION  ~ 1982   Dr. Sharlett Iles   EXCISION OF ACTINIC KERATOSIS     DR LUPTON    EYE SURGERY     INSERT / [place] / REMOVE PACEMAKER     JOINT REPLACEMENT     KNEE ARTHROSCOPY Left 2003   KNEE ARTHROSCOPY Right 06/26/2013   KNEE CLOSED REDUCTION Right  10/23/2013   Procedure: CLOSED MANIPULATION RIGHT KNEE;  Surgeon: Mcarthur Rossetti, MD;  Location: [place];  Service: Orthopedics;  Laterality: Right;   LASER FOR CLOUDY CAP LEFT EYE Left 03/2006   DR DIGBY   LUMBAR LAMINECTOMY/DECOMPRESSION MICRODISCECTOMY N/A 06/12/2019   Procedure: Laminectomy and Foraminotomy - Lumbar four-Lumbar five;  Surgeon: Eustace Moore, MD;  Location: [place];  Service: Neurosurgery;  Laterality: N/A;   [facility], Dr.Gray. Removed area on upper chest   SKIN SURGERY     May 2023, upper left chest area, perfored by the skin surgery center   TOTAL KNEE ARTHROPLASTY Left 04/2004   DR RENDALL   TOTAL KNEE ARTHROPLASTY Right 07/04/2013   Procedure: RIGHT TOTAL KNEE ARTHROPLASTY;  Surgeon: Mcarthur Rossetti, MD;  [facility];  Service: Orthopedics;  Laterality: Right;   TRIGGER FINGER RELEASE Right 09/2018   VAGINAL HYSTERECTOMY  1979   Social History   Occupational History   Not on file  Tobacco Use   Smoking status: Never   Smokeless tobacco: Never  Vaping Use   Vaping Use: Never used  Substance and Sexual Activity   Alcohol use: No   Drug use: No   Sexual activity: Yes

## 2022-09-19 ENCOUNTER — Ambulatory Visit (INDEPENDENT_AMBULATORY_CARE_PROVIDER_SITE_OTHER): Payer: Medicare HMO

## 2022-09-19 DIAGNOSIS — I495 Sick sinus syndrome: Secondary | ICD-10-CM | POA: Diagnosis not present

## 2022-09-20 LAB — CUP PACEART REMOTE DEVICE CHECK
Battery Remaining Longevity: 48 mo
Battery Remaining Percentage: 42 %
Battery Voltage: 2.96 V
Brady Statistic AP VP Percent: 8.8 %
Brady Statistic AP VS Percent: 91 %
Brady Statistic AS VP Percent: 1 %
Brady Statistic AS VS Percent: 1 %
Brady Statistic RA Percent Paced: 99 %
Brady Statistic RV Percent Paced: 8.9 %
Date Time Interrogation Session: 20230926020014
Implantable Lead Implant Date: 20170628
Implantable Lead Implant Date: 20170628
Implantable Lead Location: 753859
Implantable Lead Location: 753860
Implantable Pulse Generator Implant Date: 20170628
Lead Channel Impedance Value: 490 Ohm
Lead Channel Impedance Value: 560 Ohm
Lead Channel Pacing Threshold Amplitude: 0.75 V
Lead Channel Pacing Threshold Amplitude: 1.25 V
Lead Channel Pacing Threshold Pulse Width: 0.4 ms
Lead Channel Pacing Threshold Pulse Width: 0.4 ms
Lead Channel Sensing Intrinsic Amplitude: 1.1 mV
Lead Channel Sensing Intrinsic Amplitude: 8.7 mV
Lead Channel Setting Pacing Amplitude: 2 V
Lead Channel Setting Pacing Amplitude: 2.5 V
Lead Channel Setting Pacing Pulse Width: 0.4 ms
Lead Channel Setting Sensing Sensitivity: 2 mV
Pulse Gen Model: 2272
Pulse Gen Serial Number: 7910313

## 2022-09-24 ENCOUNTER — Other Ambulatory Visit: Payer: Self-pay | Admitting: Nurse Practitioner

## 2022-09-24 DIAGNOSIS — G4700 Insomnia, unspecified: Secondary | ICD-10-CM

## 2022-09-25 MED ORDER — METHYLPREDNISOLONE 4 MG PO TABS: ORAL_TABLET | ORAL | 0 refills | Status: DC

## 2022-09-26 DIAGNOSIS — M79675 Pain in left toe(s): Secondary | ICD-10-CM | POA: Diagnosis not present

## 2022-09-26 DIAGNOSIS — E1151 Type 2 diabetes mellitus with diabetic peripheral angiopathy without gangrene: Secondary | ICD-10-CM | POA: Diagnosis not present

## 2022-09-26 DIAGNOSIS — M79674 Pain in right toe(s): Secondary | ICD-10-CM | POA: Diagnosis not present

## 2022-09-26 DIAGNOSIS — B351 Tinea unguium: Secondary | ICD-10-CM | POA: Diagnosis not present

## 2022-09-26 DIAGNOSIS — L84 Corns and callosities: Secondary | ICD-10-CM | POA: Diagnosis not present

## 2022-09-28 NOTE — Progress Notes (Signed)
Remote pacemaker transmission.   

## 2022-10-10 DIAGNOSIS — M5416 Radiculopathy, lumbar region: Secondary | ICD-10-CM | POA: Diagnosis not present

## 2022-10-11 DIAGNOSIS — H401131 Primary open-angle glaucoma, bilateral, mild stage: Secondary | ICD-10-CM | POA: Diagnosis not present

## 2022-10-11 DIAGNOSIS — H16223 Keratoconjunctivitis sicca, not specified as Sjogren's, bilateral: Secondary | ICD-10-CM | POA: Diagnosis not present

## 2022-10-16 ENCOUNTER — Other Ambulatory Visit: Payer: Self-pay | Admitting: Student

## 2022-10-16 DIAGNOSIS — M5416 Radiculopathy, lumbar region: Secondary | ICD-10-CM

## 2022-10-17 ENCOUNTER — Other Ambulatory Visit: Payer: Self-pay | Admitting: Internal Medicine

## 2022-10-17 ENCOUNTER — Other Ambulatory Visit: Payer: Self-pay | Admitting: Nurse Practitioner

## 2022-10-17 DIAGNOSIS — Z1231 Encounter for screening mammogram for malignant neoplasm of breast: Secondary | ICD-10-CM | POA: Diagnosis not present

## 2022-10-17 LAB — HM MAMMOGRAPHY

## 2022-10-18 DIAGNOSIS — Z0100 Encounter for examination of eyes and vision without abnormal findings: Secondary | ICD-10-CM | POA: Diagnosis not present

## 2022-10-20 ENCOUNTER — Encounter: Payer: Self-pay | Admitting: Nurse Practitioner

## 2022-10-20 ENCOUNTER — Other Ambulatory Visit: Payer: Self-pay | Admitting: Nurse Practitioner

## 2022-10-23 ENCOUNTER — Ambulatory Visit
Admission: RE | Admit: 2022-10-23 | Discharge: 2022-10-23 | Disposition: A | Discharge status: Home / Self Care | Payer: Medicare HMO | Source: Ambulatory Visit | Attending: Student | Admitting: Student

## 2022-10-23 DIAGNOSIS — M5416 Radiculopathy, lumbar region: Secondary | ICD-10-CM | POA: Diagnosis not present

## 2022-10-23 DIAGNOSIS — M4316 Spondylolisthesis, lumbar region: Secondary | ICD-10-CM | POA: Diagnosis not present

## 2022-10-23 DIAGNOSIS — M5126 Other intervertebral disc displacement, lumbar region: Secondary | ICD-10-CM | POA: Diagnosis not present

## 2022-10-23 MED ORDER — ONDANSETRON HCL 4 MG/2ML IJ SOLN: 4.0000 mg | Freq: Once | INTRAMUSCULAR | Status: DC | PRN

## 2022-10-23 MED ORDER — IOPAMIDOL (ISOVUE-M 200) INJECTION 41%
20.0000 mL | Freq: Once | INTRAMUSCULAR | Status: AC
  Administered 2022-10-23: 20 mL via INTRATHECAL

## 2022-10-23 MED ORDER — MEPERIDINE HCL 50 MG/ML IJ SOLN: 50.0000 mg | Freq: Once | INTRAMUSCULAR | Status: DC | PRN

## 2022-10-23 MED ORDER — DIAZEPAM 5 MG PO TABS
5.0000 mg | ORAL_TABLET | Freq: Once | ORAL | Status: AC
  Administered 2022-10-23: 5 mg via ORAL

## 2022-10-23 NOTE — Discharge Instructions (Signed)

## 2022-10-26 DIAGNOSIS — M79674 Pain in right toe(s): Secondary | ICD-10-CM | POA: Diagnosis not present

## 2022-10-26 DIAGNOSIS — E1151 Type 2 diabetes mellitus with diabetic peripheral angiopathy without gangrene: Secondary | ICD-10-CM | POA: Diagnosis not present

## 2022-10-26 DIAGNOSIS — B351 Tinea unguium: Secondary | ICD-10-CM | POA: Diagnosis not present

## 2022-10-26 DIAGNOSIS — L84 Corns and callosities: Secondary | ICD-10-CM | POA: Diagnosis not present

## 2022-10-26 DIAGNOSIS — M79675 Pain in left toe(s): Secondary | ICD-10-CM | POA: Diagnosis not present

## 2022-10-31 DIAGNOSIS — Z6830 Body mass index (BMI) 30.0-30.9, adult: Secondary | ICD-10-CM | POA: Diagnosis not present

## 2022-10-31 DIAGNOSIS — M21371 Foot drop, right foot: Secondary | ICD-10-CM | POA: Diagnosis not present

## 2022-11-06 DIAGNOSIS — M48062 Spinal stenosis, lumbar region with neurogenic claudication: Secondary | ICD-10-CM | POA: Diagnosis not present

## 2022-11-08 ENCOUNTER — Ambulatory Visit (INDEPENDENT_AMBULATORY_CARE_PROVIDER_SITE_OTHER): Payer: Medicare HMO

## 2022-11-08 DIAGNOSIS — Z23 Encounter for immunization: Secondary | ICD-10-CM

## 2022-11-10 ENCOUNTER — Ambulatory Visit: Payer: Medicare HMO | Attending: Internal Medicine | Admitting: Internal Medicine

## 2022-11-10 ENCOUNTER — Encounter: Payer: Self-pay | Admitting: Internal Medicine

## 2022-11-10 VITALS — BP 108/60 | HR 84 | Ht 63.0 in | Wt 186.0 lb

## 2022-11-10 DIAGNOSIS — I10 Essential (primary) hypertension: Secondary | ICD-10-CM | POA: Diagnosis not present

## 2022-11-10 DIAGNOSIS — I495 Sick sinus syndrome: Secondary | ICD-10-CM

## 2022-11-10 DIAGNOSIS — Z950 Presence of cardiac pacemaker: Secondary | ICD-10-CM

## 2022-11-10 LAB — CUP PACEART INCLINIC DEVICE CHECK
Battery Remaining Longevity: 46 mo
Battery Voltage: 2.96 V
Brady Statistic RA Percent Paced: 98 %
Brady Statistic RV Percent Paced: 10 %
Date Time Interrogation Session: 20231117153651
Implantable Lead Connection Status: 753985
Implantable Lead Connection Status: 753985
Implantable Lead Implant Date: 20170628
Implantable Lead Implant Date: 20170628
Implantable Lead Location: 753859
Implantable Lead Location: 753860
Implantable Pulse Generator Implant Date: 20170628
Lead Channel Impedance Value: 450 Ohm
Lead Channel Impedance Value: 587.5 Ohm
Lead Channel Pacing Threshold Amplitude: 1.75 V
Lead Channel Pacing Threshold Amplitude: 1.75 V
Lead Channel Pacing Threshold Pulse Width: 0.4 ms
Lead Channel Pacing Threshold Pulse Width: 0.4 ms
Lead Channel Sensing Intrinsic Amplitude: 0.9 mV
Lead Channel Sensing Intrinsic Amplitude: 9 mV
Lead Channel Setting Pacing Amplitude: 2 V
Lead Channel Setting Pacing Amplitude: 2.5 V
Lead Channel Setting Pacing Pulse Width: 0.4 ms
Lead Channel Setting Sensing Sensitivity: 2 mV
Pulse Gen Model: 2272
Pulse Gen Serial Number: 7910313

## 2022-11-10 NOTE — Progress Notes (Signed)
HPI [name] returns today for followup. She is a pleasant 88 yo woman with a h/o sinus node dysfunction and atrial fib with a RVR,s /p PPM insertion. She has been treated with propafenone and a beta blocker. Since her last visit, she has done well with back surgery which helped improve the weakness in her legs. She denies palpitations. No syncope. She notes a little bit more fatigue since I saw her over the past few weeks which corresponds to her going into atrial fib. Her rates are well controlled.  Allergies  Allergen Reactions   Advil [Ibuprofen]     GI upset   Aleve [Naproxen Sodium]     GI upset   Aspirin Nausea And Vomiting    Takes baby aspirin qod without problems   Atorvastatin     Leg cramps   Celebrex [Celecoxib]     GI upset   Diclofenac     GI upset   Doxycycline     Headache   Fosamax [Alendronate Sodium]     GI discomfort    Metoclopramide Hcl     REACTION: heart palps   Nsaids Other (See Comments)    Tears my stomach up    Other     Antihistamine - increase BP   Pravastatin     Leg cramps   Timolol     Dropped HR     Current Outpatient Medications  Medication Sig Dispense Refill   acetaminophen (TYLENOL) 500 MG tablet Take 500-2,000 mg by mouth daily.     alendronate (FOSAMAX) 70 MG tablet Take 1 tablet (70 mg total) by mouth every 7 (seven) days. Take with a full glass of water on an empty stomach. 4 tablet 11   antiseptic oral rinse (BIOTENE) LIQD 15 mLs by Mouth Rinse route daily.     Artificial Saliva (ACT DRY MOUTH) LOZG Use as directed 1 drop in the mouth or throat as needed (dry mouth).     bimatoprost (LUMIGAN) 0.01 % SOLN Place 1 drop into both eyes at bedtime.      Cholecalciferol (VITAMIN D) 2000 UNITS tablet Take 2,000 Units by mouth daily.     dorzolamide (TRUSOPT) 2 % ophthalmic solution Place 1 drop into both eyes 3 (three) times daily.     ELIQUIS 5 MG TABS tablet TAKE 1 TABLET BY MOUTH TWICE A DAY 180 tablet 1   esomeprazole  (NEXIUM) 20 MG capsule TAKE 1 CAPSULE (20 MG TOTAL) BY MOUTH AS NEEDED. 90 capsule 1   ezetimibe (ZETIA) 10 MG tablet TAKE ONE TABLET BY MOUTH ONCE DAILY 90 tablet 3   methylcellulose (ARTIFICIAL TEARS) 1 % ophthalmic solution Place 1 drop into both eyes daily as needed (for dry eyes).      methylPREDNISolone (MEDROL) 4 MG tablet Take as directed 21 tablet 0   metoprolol tartrate (LOPRESSOR) 25 MG tablet TAKE 1 TABLET BY MOUTH TWICE A DAY 180 tablet 0   propafenone (RYTHMOL SR) 225 MG 12 hr capsule TAKE 1 CAPSULE BY MOUTH TWICE A DAY 180 capsule 2   traMADol (ULTRAM) 50 MG tablet Take 50 mg by mouth every 6 (six) hours as needed.     traZODone (DESYREL) 50 MG tablet TAKE 1 TABLET BY MOUTH EVERYDAY AT BEDTIME 90 tablet 1   trolamine salicylate (ASPERCREME) 10 % cream Apply 1 application topically as needed for muscle pain (for knee pain).     valsartan-hydrochlorothiazide (DIOVAN-HCT) 320-25 MG tablet TAKE ONE TABLET BY MOUTH DAILY 90  tablet 1   No current facility-administered medications for this visit.     Past Medical History:  Diagnosis Date   Allergic rhinitis due to pollen    Anxiety state, unspecified    Sitautional- pain   Arthritis    "right knee" (06/21/2016)   Asymptomatic varicose veins    Carpal tunnel syndrome    Chest pain, unspecified    Chronic lower back pain    "on the left side" (06/21/2016)   Constipation    Cramp of limb    Depressive disorder, not elsewhere classified    Diaphragmatic hernia without mention of obstruction or gangrene    Diaphragmatic hernia without mention of obstruction or gangrene    Disturbance of skin sensation    Diverticulosis of colon (without mention of hemorrhage)    Dysrhythmia    Atrial Flutter   Enthesopathy of hip region    Esophageal reflux    , just occasional   History of blood transfusion    "w/both knee replacements"   History of duodenal ulcer    History of kidney stones    passed   Insomnia, unspecified    Lumbago     Migraine    "none since ~ 1990" (06/21/2016)   Myalgia and myositis, unspecified    Obesity, unspecified    Other abnormal blood chemistry    Other dysphagia    Other malaise and fatigue    Other nonspecific abnormal serum enzyme levels    Other specified cardiac dysrhythmias(427.89)    Pacemaker    Pain in joint, ankle and foot    Pain in joint, hand    Pain in joint, lower leg    Pain in joint, pelvic region and thigh    Pain in limb    Plantar fascial fibromatosis    Presence of permanent cardiac pacemaker    -St Jude   Reflux esophagitis    Sciatica    Spinal stenosis, unspecified region other than cervical    Stricture and stenosis of esophagus    Symptomatic menopausal or female climacteric states    Unspecified essential hypertension    Unspecified essential hypertension    Unspecified vitamin D deficiency     ROS:   All systems reviewed and negative except as noted in the HPI.   Past Surgical History:  Procedure Laterality Date   BREAST BIOPSY Left 1990s X 2   CARDIOVERSION N/A 04/26/2016   Procedure: CARDIOVERSION;  Surgeon: Pixie Casino, MD;  Location: [facility];  Service: Cardiovascular;  Laterality: N/A;   CATARACT EXTRACTION W/ INTRAOCULAR LENS IMPLANT Left 08/03/1999   DR EPES    CATARACT EXTRACTION W/ INTRAOCULAR LENS IMPLANT Right 2000   DR EPES   CHOLECYSTECTOMY OPEN  1989   DR BOWMAN   COLONOSCOPY  1988   Normal    DENTAL SURGERY Left 08/2016   EP IMPLANTABLE DEVICE N/A 06/21/2016   Procedure: Pacemaker Implant;  Surgeon: Evans Lance, MD;  Location: [facility];  Service: Cardiovascular;  Laterality: N/A;   ESOPHAGOGASTRODUODENOSCOPY (EGD) WITH ESOPHAGEAL DILATION  ~ 1982   Dr. Sharlett Iles   EXCISION OF ACTINIC KERATOSIS     DR LUPTON    EYE SURGERY     INSERT / [place] / REMOVE PACEMAKER     JOINT REPLACEMENT     KNEE ARTHROSCOPY Left 2003   KNEE ARTHROSCOPY Right 06/26/2013   KNEE CLOSED REDUCTION Right 10/23/2013    Procedure: CLOSED MANIPULATION RIGHT KNEE;  Surgeon: Mcarthur Rossetti,  MD;  Location: [facility];  Service: Orthopedics;  Laterality: Right;   LASER FOR CLOUDY CAP LEFT EYE Left 03/2006   DR DIGBY   LUMBAR LAMINECTOMY/DECOMPRESSION MICRODISCECTOMY N/A 06/12/2019   Procedure: Laminectomy and Foraminotomy - Lumbar four-Lumbar five;  Surgeon: Eustace Moore, MD;  Location: [facility];  Service: Neurosurgery;  Laterality: N/A;   [facility], Dr.Gray. Removed area on upper chest   SKIN SURGERY     May 2023, upper left chest area, perfored by the skin surgery center   TOTAL KNEE ARTHROPLASTY Left 04/2004   DR RENDALL   TOTAL KNEE ARTHROPLASTY Right 07/04/2013   Procedure: RIGHT TOTAL KNEE ARTHROPLASTY;  Surgeon: Mcarthur Rossetti, MD;  Location: [facility];  Service: Orthopedics;  Laterality: Right;   TRIGGER FINGER RELEASE Right 09/2018   VAGINAL HYSTERECTOMY  1979     Family History  Problem Relation Age of Onset   Ovarian cancer Mother    Uterine cancer Mother    Cerebrovascular Accident Mother    Heart disease Father    Hypertension Brother    Obesity Daughter      Social History   Socioeconomic History   Marital status: Married    Spouse name: Not on file   Number of children: Not on file   Years of education: Not on file   Highest education level: Not on file  Occupational History   Not on file  Tobacco Use   Smoking status: Never   Smokeless tobacco: Never  Vaping Use   Vaping Use: Never used  Substance and Sexual Activity   Alcohol use: No   Drug use: No   Sexual activity: Yes  Other Topics Concern   Not on file  Social History Narrative   Not on file   Social Determinants of Health   Financial Resource Strain: Low Risk  (11/21/2017)   Overall Financial Resource Strain (CARDIA)    Difficulty of Paying Living Expenses: Not hard at all  Food Insecurity: No Food Insecurity (11/21/2017)   Hunger Vital Sign    Worried About Running Out of  Food in the Last Year: Never true    [place] in the Last Year: Never true  Transportation Needs: No Transportation Needs (11/21/2017)   PRAPARE - Hydrologist (Medical): No    Lack of Transportation (Non-Medical): No  Physical Activity: Sufficiently Active (11/21/2017)   Exercise Vital Sign    Days of Exercise per Week: 5 days    Minutes of Exercise per Session: 30 min  Stress: Stress Concern Present (11/21/2017)   [place]    Feeling of Stress : To some extent  Social Connections: Socially Integrated (11/21/2017)   Social Connection and Isolation Panel [NHANES]    Frequency of Communication with Friends and Family: Once a week    Frequency of Social Gatherings with Friends and Family: More than three times a week    Attends Religious Services: 1 to 4 times per year    Active Member of [employer] or Organizations: Yes    Attends Archivist Meetings: 1 to 4 times per year    Marital Status: Married  Human resources officer Violence: Not At Risk (11/21/2017)   Humiliation, Afraid, Rape, and Kick questionnaire    Fear of Current or Ex-Partner: No    Emotionally Abused: No    Physically Abused: No    Sexually Abused: No  BP 108/60   Pulse 84   Ht '5\' 3"'$  (1.6 m)   Wt 186 lb (84.4 kg)   SpO2 96%   BMI 32.95 kg/m   Physical Exam:  Well appearing elderly woman, NAD HEENT: Unremarkable Neck:  No JVD, no thyromegally Lymphatics:  No adenopathy Back:  No CVA tenderness Lungs:  Clear with no wheezes HEART:  IRegular rate rhythm, no murmurs, no rubs, no clicks Abd:  soft, positive bowel sounds, no organomegally, no rebound, no guarding Ext:  2 plus pulses, no edema, no cyanosis, no clubbing Skin:  No rashes no nodules Neuro:  CN II through XII intact, motor grossly intact  EKG - atrial fib  with occasional ventricular pacing  DEVICE  Normal device function.  See PaceArt  for details.   Assess/Plan: 1. PAF - she has reverted back to atrial fib. She is minimally symptomatic. We discussed the treatment options. If she worsens she will need to either start dofetilide or amio. I asked her to call us if she wishes to proceed. 2. HTN - her bp is ok. I suspect that her fatigue is related to her metoprolol and have asked her to continue '25mg'$  bid. 3. Sinus node dysfunction - she is pacing in the atrium over 99% of the time. 4. PPM - her St. Jude DDD PM is working normally. Her rates are improved.    [name] Nakyia Dau,MD

## 2022-11-10 NOTE — Patient Instructions (Addendum)
Medication Instructions:  Your physician recommends that you continue on your current medications as directed. Please refer to the Current Medication list given to you today.  *If you need a refill on your cardiac medications before your next appointment, please call your pharmacy*  Lab Work: None ordered.  If you have labs (blood work) drawn today and your tests are completely normal, you will receive your results only by: [place] (if you have MyChart) OR A paper copy in the mail If you have any lab test that is abnormal or we need to change your treatment, we will call you to review the results.  Testing/Procedures: None ordered.  Follow-Up: At [facility], you and your health needs are our priority.  As part of our continuing mission to provide you with exceptional heart care, we have created designated Provider Care Teams.  These Care Teams include your primary Cardiologist (physician) and Advanced Practice Providers (APPs -  Physician Assistants and Nurse Practitioners) who all work together to provide you with the care you need, when you need it.  We recommend signing up for the patient portal called "MyChart".  Sign up information is provided on this After Visit Summary.  MyChart is used to connect with patients for Virtual Visits (Telemedicine).  Patients are able to view lab/test results, encounter notes, upcoming appointments, etc.  Non-urgent messages can be sent to your provider as well.   To learn more about what you can do with MyChart, go to [email or web address].    Your next appointment:   Please schedule follow up appointment with Tommye Standard, PA-C in February;  Please schedule a follow appointment with Dr. Lovena Le in 6 months.    The format for your next appointment:   In Person  Provider:   Cristopher Peru, MD{or one of the following Advanced Practice Providers on your designated Care Team:   Tommye Standard, [place] Legrand Como "Jonni Sanger" Chalmers Cater, [place]  Remote  monitoring is used to monitor your Pacemaker from home. This monitoring reduces the number of office visits required to check your device to one time per year. It allows [place] to keep an eye on the functioning of your device to ensure it is working properly. You are scheduled for a device check from home on 12/19/22. You may send your transmission at any time that day. If you have a wireless device, the transmission will be sent automatically. After your physician reviews your transmission, you will receive a postcard with your next transmission date.

## 2022-11-13 DIAGNOSIS — M545 Low back pain, unspecified: Secondary | ICD-10-CM | POA: Diagnosis not present

## 2022-11-15 NOTE — Progress Notes (Signed)
Cardiology Office Note   Date:  11/20/2022   ID:  [name], DOB 1931-07-13, MRN [id number]  PCP:  Lauree Chandler, NP  Cardiologist:   Lia Vigilante Martinique, MD   Chief Complaint  Patient presents with   Atrial Fibrillation       History of Present Illness: [name] is a 88 y.o. female is seen for follow up Afib and dyspnea. She has a long history of marked sinus bradycardia and atrial fibrillation/flutter. She was seen initially in 2014. Echo at that time showed mild LAE otherwise normal. Holter showed mean HR 59 with lowest HR 43 and peak HR 109.    On March 9,2017 she noted an increased HR by BP monitor up to 153. At this time she felt marked indigestion from her waist to her neck. She felt her heart fluttering.   She was found to be in Afib with RVR. She was started on Eliquis and metoprolol. Myoview study and Echo were normal.   She later had attempt at DCCV. She was in an atypical atrial flutter at that time. DCCV resulted in very prolonged pauses > 6 seconds and return to flutter. She was seen by Dr. Curt Bears and placed on flecainide. This did convert her to NSR but made her feel very sick with nausea, dizziness, and extreme fatigue. Flecainide was discontinued.. She continued to have marked bradycardia and underwent PPM placement on 06/21/16. When seen in September 2018 by Dr. Lovena Le she had an Afib burden of 94%. She was started on propafenone for her Afib. Initially this medication caused her to have more nausea but this has improved.  On her subsequent  checks her Afib burden was less than 1% since December 2019.   More recently seen by Dr Lovena Le on 11/17. By pacer check she was in Afib continually. Rate controlled.   On follow up today she does report some increased SOB and fatigue over the past month or two. Really is not aware of palpitations. No edema. No chest pain.      Past Medical History:  Diagnosis Date   Allergic rhinitis due to pollen    Anxiety  state, unspecified    Sitautional- pain   Arthritis    "right knee" (06/21/2016)   Asymptomatic varicose veins    Carpal tunnel syndrome    Chest pain, unspecified    Chronic lower back pain    "on the left side" (06/21/2016)   Constipation    Cramp of limb    Depressive disorder, not elsewhere classified    Diaphragmatic hernia without mention of obstruction or gangrene    Diaphragmatic hernia without mention of obstruction or gangrene    Disturbance of skin sensation    Diverticulosis of colon (without mention of hemorrhage)    Dysrhythmia    Atrial Flutter   Enthesopathy of hip region    Esophageal reflux    , just occasional   History of blood transfusion    "w/both knee replacements"   History of duodenal ulcer    History of kidney stones    passed   Insomnia, unspecified    Lumbago    Migraine    "none since ~ 1990" (06/21/2016)   Myalgia and myositis, unspecified    Obesity, unspecified    Other abnormal blood chemistry    Other dysphagia    Other malaise and fatigue    Other nonspecific abnormal serum enzyme levels    Other specified cardiac dysrhythmias(427.89)  Pacemaker    Pain in joint, ankle and foot    Pain in joint, hand    Pain in joint, lower leg    Pain in joint, pelvic region and thigh    Pain in limb    Plantar fascial fibromatosis    Presence of permanent cardiac pacemaker    -[employer]   Reflux esophagitis    Sciatica    Spinal stenosis, unspecified region other than cervical    Stricture and stenosis of esophagus    Symptomatic menopausal or female climacteric states    Unspecified essential hypertension    Unspecified essential hypertension    Unspecified vitamin D deficiency     Past Surgical History:  Procedure Laterality Date   BREAST BIOPSY Left 1990s X 2   CARDIOVERSION N/A 04/26/2016   Procedure: CARDIOVERSION;  Surgeon: Pixie Casino, MD;  Location: [facility] ENDOSCOPY;  Service: Cardiovascular;  Laterality: N/A;   CATARACT  EXTRACTION W/ INTRAOCULAR LENS IMPLANT Left 08/03/1999   DR EPES    CATARACT EXTRACTION W/ INTRAOCULAR LENS IMPLANT Right 2000   DR EPES   CHOLECYSTECTOMY OPEN  1989   DR BOWMAN   COLONOSCOPY  1988   Normal    DENTAL SURGERY Left 08/2016   EP IMPLANTABLE DEVICE N/A 06/21/2016   Procedure: Pacemaker Implant;  Surgeon: Evans Lance, MD;  Location: [place] [facility];  Service: Cardiovascular;  Laterality: N/A;   ESOPHAGOGASTRODUODENOSCOPY (EGD) WITH ESOPHAGEAL DILATION  ~ 1982   Dr. Sharlett Iles   EXCISION OF ACTINIC KERATOSIS     DR LUPTON    EYE SURGERY     INSERT / [place] / REMOVE PACEMAKER     JOINT REPLACEMENT     KNEE ARTHROSCOPY Left 2003   KNEE ARTHROSCOPY Right 06/26/2013   KNEE CLOSED REDUCTION Right 10/23/2013   Procedure: CLOSED MANIPULATION RIGHT KNEE;  Surgeon: Mcarthur Rossetti, MD;  Location: [place];  Service: Orthopedics;  Laterality: Right;   LASER FOR CLOUDY CAP LEFT EYE Left 03/2006   DR DIGBY   LUMBAR LAMINECTOMY/DECOMPRESSION MICRODISCECTOMY N/A 06/12/2019   Procedure: Laminectomy and Foraminotomy - Lumbar four-Lumbar five;  Surgeon: Eustace Moore, MD;  Location: [place];  Service: Neurosurgery;  Laterality: N/A;   [facility], Dr.Gray. Removed area on upper chest   SKIN SURGERY     May 2023, upper left chest area, perfored by the skin surgery center   TOTAL KNEE ARTHROPLASTY Left 04/2004   DR RENDALL   TOTAL KNEE ARTHROPLASTY Right 07/04/2013   Procedure: RIGHT TOTAL KNEE ARTHROPLASTY;  Surgeon: Mcarthur Rossetti, MD;  Location: [facility];  Service: Orthopedics;  Laterality: Right;   TRIGGER FINGER RELEASE Right 09/2018   VAGINAL HYSTERECTOMY  1979     Current Outpatient Medications  Medication Sig Dispense Refill   acetaminophen (TYLENOL) 500 MG tablet Take 500-2,000 mg by mouth daily.     alendronate (FOSAMAX) 70 MG tablet Take 1 tablet (70 mg total) by mouth every 7 (seven) days. Take with a full glass of water on an empty  stomach. 4 tablet 11   antiseptic oral rinse (BIOTENE) LIQD 15 mLs by Mouth Rinse route daily.     Artificial Saliva (ACT DRY MOUTH) LOZG Use as directed 1 drop in the mouth or throat as needed (dry mouth).     bimatoprost (LUMIGAN) 0.01 % SOLN Place 1 drop into both eyes at bedtime.      Cholecalciferol (VITAMIN D) 2000 UNITS tablet Take 2,000  Units by mouth daily.     dorzolamide (TRUSOPT) 2 % ophthalmic solution Place 1 drop into both eyes 3 (three) times daily.     ELIQUIS 5 MG TABS tablet TAKE 1 TABLET BY MOUTH TWICE A DAY 180 tablet 1   esomeprazole (NEXIUM) 20 MG capsule TAKE 1 CAPSULE (20 MG TOTAL) BY MOUTH AS NEEDED. 90 capsule 1   ezetimibe (ZETIA) 10 MG tablet TAKE ONE TABLET BY MOUTH ONCE DAILY 90 tablet 3   methylcellulose (ARTIFICIAL TEARS) 1 % ophthalmic solution Place 1 drop into both eyes daily as needed (for dry eyes).      methylPREDNISolone (MEDROL) 4 MG tablet Take as directed 21 tablet 0   metoprolol tartrate (LOPRESSOR) 25 MG tablet TAKE 1 TABLET BY MOUTH TWICE A DAY 180 tablet 0   propafenone (RYTHMOL SR) 225 MG 12 hr capsule TAKE 1 CAPSULE BY MOUTH TWICE A DAY 180 capsule 2   traMADol (ULTRAM) 50 MG tablet Take 50 mg by mouth every 6 (six) hours as needed.     traZODone (DESYREL) 50 MG tablet TAKE 1 TABLET BY MOUTH EVERYDAY AT BEDTIME 90 tablet 1   trolamine salicylate (ASPERCREME) 10 % cream Apply 1 application topically as needed for muscle pain (for knee pain).     valsartan-hydrochlorothiazide (DIOVAN-HCT) 320-25 MG tablet TAKE ONE TABLET BY MOUTH DAILY 90 tablet 1   No current facility-administered medications for this visit.    Allergies:   Advil [ibuprofen], Aleve [naproxen sodium], Aspirin, Atorvastatin, Celebrex [celecoxib], Diclofenac, Doxycycline, Fosamax [alendronate sodium], Metoclopramide hcl, Nsaids, Other, Pravastatin, and Timolol    Social History:  The patient  reports that she has never smoked. She has never used smokeless tobacco. She reports that  she does not drink alcohol and does not use drugs.   Family History:  The patient's family history includes Cerebrovascular Accident in her mother; Heart disease in her father; Hypertension in her brother; Obesity in her daughter; Ovarian cancer in her mother; Uterine cancer in her mother.    ROS:  Please see the history of present illness.   Otherwise, review of systems are positive for none.   All other systems are reviewed and negative.    PHYSICAL EXAM: VS:  BP (!) 143/83   Pulse 94   Ht '5\' 2"'$  (1.575 m)   Wt 185 lb 8 oz (84.1 kg)   BMI 33.93 kg/m  , BMI Body mass index is 33.93 kg/m. GENERAL:  Well appearing, obese WF in NAD HEENT:  PERRL, EOMI, sclera are clear. Oropharynx is clear. NECK:  No jugular venous distention, carotid upstroke brisk and symmetric, no bruits, no thyromegaly or adenopathy LUNGS:  clear  CHEST:  Unremarkable HEART:  IRRR,  PMI not displaced or sustained,S1 and S2 within normal limits, no S3, no S4: no clicks, no rubs, no murmurs ABD:  Soft, nontender. BS +, no masses or bruits. No hepatomegaly, no splenomegaly EXT:  2 + pulses throughout, no edema, no cyanosis no clubbing SKIN:  Warm and dry.  No rashes NEURO:  Alert and oriented x 3. Cranial nerves II through XII intact. PSYCH:  Cognitively intact    EKG:  EKG is ordered today. Afib rate 94. Nonspecific ST-T abnormality. Normal QTc. I have personally reviewed and interpreted this study.   Recent Labs: 07/17/2022: ALT 16; BUN 17; Creat 0.68; Hemoglobin 13.0; Platelets 190; Potassium 3.7; Sodium 139; TSH 1.76    Lipid Panel    Component Value Date/Time   CHOL 188 07/17/2022 1436  CHOL 179 03/27/2016 0853   TRIG 347 (H) 07/17/2022 1436   HDL 36 (L) 07/17/2022 1436   HDL 42 03/27/2016 0853   CHOLHDL 5.2 (H) 07/17/2022 1436   VLDL 42 (H) 08/06/2017 0918   LDLCALC 106 (H) 07/17/2022 1436      Wt Readings from Last 3 Encounters:  11/20/22 185 lb 8 oz (84.1 kg)  11/10/22 186 lb (84.4 kg)   08/31/22 189 lb 6.4 oz (85.9 kg)      Other studies Reviewed: Additional studies/ records that were reviewed today include:  Echo: 09/06/17: Study Conclusions   - Left ventricle: The cavity size was normal. Wall thickness was   increased in a pattern of mild LVH. Systolic function was normal.   The estimated ejection fraction was in the range of 55% to 60%.   Doppler parameters are consistent with both elevated ventricular   end-diastolic filling pressure and elevated left atrial filling   pressure. - Left atrium: The atrium was moderately dilated. - Atrial septum: There was increased thickness of the septum,   consistent with lipomatous hypertrophy. No defect or patent   foramen ovale was identified  Echo 10/7//21: IMPRESSIONS     1. Left ventricular ejection fraction, by estimation, is 55 to 60%. The  left ventricle has normal function. The left ventricle has no regional  wall motion abnormalities. There is mild left ventricular hypertrophy.  Left ventricular diastolic parameters  are consistent with Grade I diastolic dysfunction (impaired relaxation).  The E/e' is 8.   2. Right ventricular systolic function is normal. The right ventricular  size is normal.   3. Left atrial size was moderately dilated.   4. The mitral valve is grossly normal. No evidence of mitral valve  regurgitation.   5. The aortic valve is tricuspid. There is mild calcification of the  aortic valve. Aortic valve regurgitation is not visualized.   6. The inferior vena cava is normal in size with greater than 50%  respiratory variability, suggesting right atrial pressure of 3 mmHg.   Comparison(s): No significant change from prior study.   ASSESSMENT AND PLAN:  1.   Atrial fibrillation/flutter with RVR.  Intolerant of flecainide.  s/p PPM placement for marked post conversion pauses and bradycardia.  Has been on  propafenone with very low Afib burden until this past month when she developed sustained  AFib.  Followed by Dr Lovena Le. She has been compliant with her medication. We discussed options for management including rate control only, switching to a different AAD such as amiodarone or Tikosyn. I would actually favor DCCV on propafenone. She has done extremely well on this for the past 6 years. If she cannot be converted or has early recurrence of afib then one of the other options will come into play. Will plan on DCCV on Dec 11. Procedure and risks discussed in detail and she is agreeable to proceed.   2. HTN well controlled.  3. Mild hypercholesterolemia.   Follow up post CV  Signed, Suman Trivedi Martinique, MD,FACC  11/20/2022 2:17 PM    [facility] [address] [place], [place], [address] Phone [phone], Fax [phone]

## 2022-11-15 NOTE — H&P (View-Only) (Signed)
Cardiology Office Note   Date:  11/20/2022   ID:  [name], DOB 01-20-1931, MRN [id number]  PCP:  Lauree Chandler, NP  Cardiologist:   Naftula Donahue Martinique, MD   Chief Complaint  Patient presents with   Atrial Fibrillation       History of Present Illness: [name] is a 88 y.o. female is seen for follow up Afib and dyspnea. She has a long history of marked sinus bradycardia and atrial fibrillation/flutter. She was seen initially in 2014. Echo at that time showed mild LAE otherwise normal. Holter showed mean HR 59 with lowest HR 43 and peak HR 109.    On March 9,2017 she noted an increased HR by BP monitor up to 153. At this time she felt marked indigestion from her waist to her neck. She felt her heart fluttering.   She was found to be in Afib with RVR. She was started on Eliquis and metoprolol. Myoview study and Echo were normal.   She later had attempt at DCCV. She was in an atypical atrial flutter at that time. DCCV resulted in very prolonged pauses > 6 seconds and return to flutter. She was seen by Dr. Curt Bears and placed on flecainide. This did convert her to NSR but made her feel very sick with nausea, dizziness, and extreme fatigue. Flecainide was discontinued.. She continued to have marked bradycardia and underwent PPM placement on 06/21/16. When seen in September 2018 by Dr. Lovena Le she had an Afib burden of 94%. She was started on propafenone for her Afib. Initially this medication caused her to have more nausea but this has improved.  On her subsequent  checks her Afib burden was less than 1% since December 2019.   More recently seen by Dr Lovena Le on 11/17. By pacer check she was in Afib continually. Rate controlled.   On follow up today she does report some increased SOB and fatigue over the past month or two. Really is not aware of palpitations. No edema. No chest pain.      Past Medical History:  Diagnosis Date   Allergic rhinitis due to pollen    Anxiety  state, unspecified    Sitautional- pain   Arthritis    "right knee" (06/21/2016)   Asymptomatic varicose veins    Carpal tunnel syndrome    Chest pain, unspecified    Chronic lower back pain    "on the left side" (06/21/2016)   Constipation    Cramp of limb    Depressive disorder, not elsewhere classified    Diaphragmatic hernia without mention of obstruction or gangrene    Diaphragmatic hernia without mention of obstruction or gangrene    Disturbance of skin sensation    Diverticulosis of colon (without mention of hemorrhage)    Dysrhythmia    Atrial Flutter   Enthesopathy of hip region    Esophageal reflux    , just occasional   History of blood transfusion    "w/both knee replacements"   History of duodenal ulcer    History of kidney stones    passed   Insomnia, unspecified    Lumbago    Migraine    "none since ~ 1990" (06/21/2016)   Myalgia and myositis, unspecified    Obesity, unspecified    Other abnormal blood chemistry    Other dysphagia    Other malaise and fatigue    Other nonspecific abnormal serum enzyme levels    Other specified cardiac dysrhythmias(427.89)  Pacemaker    Pain in joint, ankle and foot    Pain in joint, hand    Pain in joint, lower leg    Pain in joint, pelvic region and thigh    Pain in limb    Plantar fascial fibromatosis    Presence of permanent cardiac pacemaker    -[employer]   Reflux esophagitis    Sciatica    Spinal stenosis, unspecified region other than cervical    Stricture and stenosis of esophagus    Symptomatic menopausal or female climacteric states    Unspecified essential hypertension    Unspecified essential hypertension    Unspecified vitamin D deficiency     Past Surgical History:  Procedure Laterality Date   BREAST BIOPSY Left 1990s X 2   CARDIOVERSION N/A 04/26/2016   Procedure: CARDIOVERSION;  Surgeon: Pixie Casino, MD;  Location: [facility] ENDOSCOPY;  Service: Cardiovascular;  Laterality: N/A;   CATARACT  EXTRACTION W/ INTRAOCULAR LENS IMPLANT Left 08/03/1999   DR EPES    CATARACT EXTRACTION W/ INTRAOCULAR LENS IMPLANT Right 2000   DR EPES   CHOLECYSTECTOMY OPEN  1989   DR BOWMAN   COLONOSCOPY  1988   Normal    DENTAL SURGERY Left 08/2016   EP IMPLANTABLE DEVICE N/A 06/21/2016   Procedure: Pacemaker Implant;  Surgeon: Evans Lance, MD;  Location: [place] CV LAB;  Service: Cardiovascular;  Laterality: N/A;   ESOPHAGOGASTRODUODENOSCOPY (EGD) WITH ESOPHAGEAL DILATION  ~ 1982   Dr. Sharlett Iles   EXCISION OF ACTINIC KERATOSIS     DR LUPTON    EYE SURGERY     INSERT / [place] / REMOVE PACEMAKER     JOINT REPLACEMENT     KNEE ARTHROSCOPY Left 2003   KNEE ARTHROSCOPY Right 06/26/2013   KNEE CLOSED REDUCTION Right 10/23/2013   Procedure: CLOSED MANIPULATION RIGHT KNEE;  Surgeon: Mcarthur Rossetti, MD;  Location: [place];  Service: Orthopedics;  Laterality: Right;   LASER FOR CLOUDY CAP LEFT EYE Left 03/2006   DR DIGBY   LUMBAR LAMINECTOMY/DECOMPRESSION MICRODISCECTOMY N/A 06/12/2019   Procedure: Laminectomy and Foraminotomy - Lumbar four-Lumbar five;  Surgeon: Eustace Moore, MD;  Location: [place];  Service: Neurosurgery;  Laterality: N/A;   [facility], Dr.Gray. Removed area on upper chest   SKIN SURGERY     May 2023, upper left chest area, perfored by the skin surgery center   TOTAL KNEE ARTHROPLASTY Left 04/2004   DR RENDALL   TOTAL KNEE ARTHROPLASTY Right 07/04/2013   Procedure: RIGHT TOTAL KNEE ARTHROPLASTY;  Surgeon: Mcarthur Rossetti, MD;  Location: [facility];  Service: Orthopedics;  Laterality: Right;   TRIGGER FINGER RELEASE Right 09/2018   VAGINAL HYSTERECTOMY  1979     Current Outpatient Medications  Medication Sig Dispense Refill   acetaminophen (TYLENOL) 500 MG tablet Take 500-2,000 mg by mouth daily.     alendronate (FOSAMAX) 70 MG tablet Take 1 tablet (70 mg total) by mouth every 7 (seven) days. Take with a full glass of water on an empty  stomach. 4 tablet 11   antiseptic oral rinse (BIOTENE) LIQD 15 mLs by Mouth Rinse route daily.     Artificial Saliva (ACT DRY MOUTH) LOZG Use as directed 1 drop in the mouth or throat as needed (dry mouth).     bimatoprost (LUMIGAN) 0.01 % SOLN Place 1 drop into both eyes at bedtime.      Cholecalciferol (VITAMIN D) 2000 UNITS tablet Take 2,000  Units by mouth daily.     dorzolamide (TRUSOPT) 2 % ophthalmic solution Place 1 drop into both eyes 3 (three) times daily.     ELIQUIS 5 MG TABS tablet TAKE 1 TABLET BY MOUTH TWICE A DAY 180 tablet 1   esomeprazole (NEXIUM) 20 MG capsule TAKE 1 CAPSULE (20 MG TOTAL) BY MOUTH AS NEEDED. 90 capsule 1   ezetimibe (ZETIA) 10 MG tablet TAKE ONE TABLET BY MOUTH ONCE DAILY 90 tablet 3   methylcellulose (ARTIFICIAL TEARS) 1 % ophthalmic solution Place 1 drop into both eyes daily as needed (for dry eyes).      methylPREDNISolone (MEDROL) 4 MG tablet Take as directed 21 tablet 0   metoprolol tartrate (LOPRESSOR) 25 MG tablet TAKE 1 TABLET BY MOUTH TWICE A DAY 180 tablet 0   propafenone (RYTHMOL SR) 225 MG 12 hr capsule TAKE 1 CAPSULE BY MOUTH TWICE A DAY 180 capsule 2   traMADol (ULTRAM) 50 MG tablet Take 50 mg by mouth every 6 (six) hours as needed.     traZODone (DESYREL) 50 MG tablet TAKE 1 TABLET BY MOUTH EVERYDAY AT BEDTIME 90 tablet 1   trolamine salicylate (ASPERCREME) 10 % cream Apply 1 application topically as needed for muscle pain (for knee pain).     valsartan-hydrochlorothiazide (DIOVAN-HCT) 320-25 MG tablet TAKE ONE TABLET BY MOUTH DAILY 90 tablet 1   No current facility-administered medications for this visit.    Allergies:   Advil [ibuprofen], Aleve [naproxen sodium], Aspirin, Atorvastatin, Celebrex [celecoxib], Diclofenac, Doxycycline, Fosamax [alendronate sodium], Metoclopramide hcl, Nsaids, Other, Pravastatin, and Timolol    Social History:  The patient  reports that she has never smoked. She has never used smokeless tobacco. She reports that  she does not drink alcohol and does not use drugs.   Family History:  The patient's family history includes Cerebrovascular Accident in her mother; Heart disease in her father; Hypertension in her brother; Obesity in her daughter; Ovarian cancer in her mother; Uterine cancer in her mother.    ROS:  Please see the history of present illness.   Otherwise, review of systems are positive for none.   All other systems are reviewed and negative.    PHYSICAL EXAM: VS:  BP (!) 143/83   Pulse 94   Ht '5\' 2"'$  (1.575 m)   Wt 185 lb 8 oz (84.1 kg)   BMI 33.93 kg/m  , BMI Body mass index is 33.93 kg/m. GENERAL:  Well appearing, obese WF in NAD HEENT:  PERRL, EOMI, sclera are clear. Oropharynx is clear. NECK:  No jugular venous distention, carotid upstroke brisk and symmetric, no bruits, no thyromegaly or adenopathy LUNGS:  clear  CHEST:  Unremarkable HEART:  IRRR,  PMI not displaced or sustained,S1 and S2 within normal limits, no S3, no S4: no clicks, no rubs, no murmurs ABD:  Soft, nontender. BS +, no masses or bruits. No hepatomegaly, no splenomegaly EXT:  2 + pulses throughout, no edema, no cyanosis no clubbing SKIN:  Warm and dry.  No rashes NEURO:  Alert and oriented x 3. Cranial nerves II through XII intact. PSYCH:  Cognitively intact    EKG:  EKG is ordered today. Afib rate 94. Nonspecific ST-T abnormality. Normal QTc. I have personally reviewed and interpreted this study.   Recent Labs: 07/17/2022: ALT 16; BUN 17; Creat 0.68; Hemoglobin 13.0; Platelets 190; Potassium 3.7; Sodium 139; TSH 1.76    Lipid Panel    Component Value Date/Time   CHOL 188 07/17/2022 1436  CHOL 179 03/27/2016 0853   TRIG 347 (H) 07/17/2022 1436   HDL 36 (L) 07/17/2022 1436   HDL 42 03/27/2016 0853   CHOLHDL 5.2 (H) 07/17/2022 1436   VLDL 42 (H) 08/06/2017 0918   LDLCALC 106 (H) 07/17/2022 1436      Wt Readings from Last 3 Encounters:  11/20/22 185 lb 8 oz (84.1 kg)  11/10/22 186 lb (84.4 kg)   08/31/22 189 lb 6.4 oz (85.9 kg)      Other studies Reviewed: Additional studies/ records that were reviewed today include:  Echo: 09/06/17: Study Conclusions   - Left ventricle: The cavity size was normal. Wall thickness was   increased in a pattern of mild LVH. Systolic function was normal.   The estimated ejection fraction was in the range of 55% to 60%.   Doppler parameters are consistent with both elevated ventricular   end-diastolic filling pressure and elevated left atrial filling   pressure. - Left atrium: The atrium was moderately dilated. - Atrial septum: There was increased thickness of the septum,   consistent with lipomatous hypertrophy. No defect or patent   foramen ovale was identified  Echo 10/7//21: IMPRESSIONS     1. Left ventricular ejection fraction, by estimation, is 55 to 60%. The  left ventricle has normal function. The left ventricle has no regional  wall motion abnormalities. There is mild left ventricular hypertrophy.  Left ventricular diastolic parameters  are consistent with Grade I diastolic dysfunction (impaired relaxation).  The E/e' is 78.   2. Right ventricular systolic function is normal. The right ventricular  size is normal.   3. Left atrial size was moderately dilated.   4. The mitral valve is grossly normal. No evidence of mitral valve  regurgitation.   5. The aortic valve is tricuspid. There is mild calcification of the  aortic valve. Aortic valve regurgitation is not visualized.   6. The inferior vena cava is normal in size with greater than 50%  respiratory variability, suggesting right atrial pressure of 3 mmHg.   Comparison(s): No significant change from prior study.   ASSESSMENT AND PLAN:  1.   Atrial fibrillation/flutter with RVR.  Intolerant of flecainide.  s/p PPM placement for marked post conversion pauses and bradycardia.  Has been on  propafenone with very low Afib burden until this past month when she developed sustained  AFib.  Followed by Dr Lovena Le. She has been compliant with her medication. We discussed options for management including rate control only, switching to a different AAD such as amiodarone or Tikosyn. I would actually favor DCCV on propafenone. She has done extremely well on this for the past 6 years. If she cannot be converted or has early recurrence of afib then one of the other options will come into play. Will plan on DCCV on Dec 11. Procedure and risks discussed in detail and she is agreeable to proceed.   2. HTN well controlled.  3. Mild hypercholesterolemia.   Follow up post CV  Signed, Chez Bulnes Martinique, MD,FACC  11/20/2022 2:17 PM    [facility] [address], [place], [place], [address] Phone [phone], Fax [phone]

## 2022-11-20 ENCOUNTER — Encounter: Payer: Self-pay | Admitting: Cardiology

## 2022-11-20 ENCOUNTER — Ambulatory Visit: Payer: Medicare HMO | Attending: Cardiology | Admitting: Cardiology

## 2022-11-20 ENCOUNTER — Other Ambulatory Visit: Payer: Self-pay | Admitting: Cardiology

## 2022-11-20 VITALS — BP 143/83 | HR 94 | Ht 62.0 in | Wt 185.5 lb

## 2022-11-20 DIAGNOSIS — R0602 Shortness of breath: Secondary | ICD-10-CM | POA: Diagnosis not present

## 2022-11-20 DIAGNOSIS — I480 Paroxysmal atrial fibrillation: Secondary | ICD-10-CM | POA: Diagnosis not present

## 2022-11-20 DIAGNOSIS — I495 Sick sinus syndrome: Secondary | ICD-10-CM

## 2022-11-20 DIAGNOSIS — I10 Essential (primary) hypertension: Secondary | ICD-10-CM

## 2022-11-20 DIAGNOSIS — Z950 Presence of cardiac pacemaker: Secondary | ICD-10-CM | POA: Diagnosis not present

## 2022-11-20 DIAGNOSIS — I4819 Other persistent atrial fibrillation: Secondary | ICD-10-CM

## 2022-11-20 NOTE — Patient Instructions (Signed)
Medication Instructions:  Continue same medications *If you need a refill on your cardiac medications before your next appointment, please call your pharmacy*   Lab Work: Bmet,cbc today   Testing/Procedures: Cardioversion scheduled Monday 12/11 Arrive at [facility] at 8:30 am  Follow instructions below   Follow-Up: At [facility], you and your health needs are our priority.  As part of our continuing mission to provide you with exceptional heart care, we have created designated Provider Care Teams.  These Care Teams include your primary Cardiologist (physician) and Advanced Practice Providers (APPs -  Physician Assistants and Nurse Practitioners) who all work together to provide you with the care you need, when you need it.  We recommend signing up for the patient portal called "MyChart".  Sign up information is provided on this After Visit Summary.  MyChart is used to connect with patients for Virtual Visits (Telemedicine).  Patients are able to view lab/test results, encounter notes, upcoming appointments, etc.  Non-urgent messages can be sent to your provider as well.   To learn more about what you can do with MyChart, go to [email or web address].    Your next appointment:  After Cardioversion    The format for your next appointment: Office   Provider:  Dr.Jordan        You are scheduled for a Cardioversion on Monday, December 11 with Dr. Margaretann Loveless.  Please arrive at the [facility] (Main Entrance A) at [facility]: [address] [place], [place] [address] at 8:30 AM.   DIET:  Nothing to eat or drink after midnight except a sip of water with medications (see medication instructions below)  MEDICATION INSTRUCTIONS: Continue taking Eliquis do not miss any doses Continue taking your anticoagulant (blood thinner): Apixaban (Eliquis).  You will need to continue this after your procedure until you are told by your provider that it is safe to stop.    LABS:  Have bmet and cbc done Monday 12/4 at [place]  Lab order enclosed   FYI:  For your safety, and to allow [place] to monitor your vital signs accurately during the surgery/procedure we request: If you have artificial nails, gel coating, SNS etc, please have those removed prior to your surgery/procedure. Not having the nail coverings /polish removed may result in cancellation or delay of your surgery/procedure.  You must have a responsible person to drive you home and stay in the waiting area during your procedure. Failure to do so could result in cancellation.  Bring your insurance cards.  *Special Note: Every effort is made to have your procedure done on time. Occasionally there are emergencies that occur at the hospital that may cause delays. Please be patient if a delay does occur.     Important Information About Sugar

## 2022-11-22 NOTE — Addendum Note (Signed)
Addended by: Kathyrn Lass on: 11/22/2022 03:29 PM   Modules accepted: Orders

## 2022-11-24 ENCOUNTER — Encounter: Payer: Medicare HMO | Admitting: Internal Medicine

## 2022-11-27 DIAGNOSIS — I480 Paroxysmal atrial fibrillation: Secondary | ICD-10-CM | POA: Diagnosis not present

## 2022-11-27 DIAGNOSIS — R0602 Shortness of breath: Secondary | ICD-10-CM | POA: Diagnosis not present

## 2022-11-28 DIAGNOSIS — M21371 Foot drop, right foot: Secondary | ICD-10-CM | POA: Diagnosis not present

## 2022-11-28 DIAGNOSIS — Z6831 Body mass index (BMI) 31.0-31.9, adult: Secondary | ICD-10-CM | POA: Diagnosis not present

## 2022-11-28 LAB — CBC WITH DIFFERENTIAL/PLATELET
Basophils Absolute: 0 10*3/uL (ref 0.0–0.2)
Basos: 1 %
EOS (ABSOLUTE): 0.1 10*3/uL (ref 0.0–0.4)
Eos: 1 %
Hematocrit: 37 % (ref 34.0–46.6)
Hemoglobin: 12.1 g/dL (ref 11.1–15.9)
Immature Grans (Abs): 0 10*3/uL (ref 0.0–0.1)
Immature Granulocytes: 0 %
Lymphocytes Absolute: 1.6 10*3/uL (ref 0.7–3.1)
Lymphs: 33 %
MCH: 30.8 pg (ref 26.6–33.0)
MCHC: 32.7 g/dL (ref 31.5–35.7)
MCV: 94 fL (ref 79–97)
Monocytes Absolute: 0.4 10*3/uL (ref 0.1–0.9)
Monocytes: 7 %
Neutrophils Absolute: 2.9 10*3/uL (ref 1.4–7.0)
Neutrophils: 58 %
Platelets: 184 10*3/uL (ref 150–450)
RBC: 3.93 x10E6/uL (ref 3.77–5.28)
RDW: 13.1 % (ref 11.7–15.4)
WBC: 5 10*3/uL (ref 3.4–10.8)

## 2022-11-28 LAB — BASIC METABOLIC PANEL
BUN/Creatinine Ratio: 24 (ref 12–28)
BUN: 19 mg/dL (ref 8–27)
CO2: 23 mmol/L (ref 20–29)
Calcium: 9.3 mg/dL (ref 8.7–10.3)
Chloride: 104 mmol/L (ref 96–106)
Creatinine, Ser: 0.8 mg/dL (ref 0.57–1.00)
Glucose: 97 mg/dL (ref 70–99)
Potassium: 4.1 mmol/L (ref 3.5–5.2)
Sodium: 141 mmol/L (ref 134–144)
eGFR: 70 mL/min/{1.73_m2} (ref >59)

## 2022-12-04 ENCOUNTER — Other Ambulatory Visit: Payer: Self-pay

## 2022-12-04 ENCOUNTER — Encounter (HOSPITAL_COMMUNITY)
Admission: RE | Disposition: A | Discharge status: Home / Self Care | Payer: Self-pay | Source: Home / Self Care | Attending: Internal Medicine

## 2022-12-04 ENCOUNTER — Encounter (HOSPITAL_COMMUNITY): Payer: Self-pay | Admitting: Internal Medicine

## 2022-12-04 ENCOUNTER — Ambulatory Visit (HOSPITAL_COMMUNITY)
Admission: RE | Admit: 2022-12-04 | Discharge: 2022-12-04 | Disposition: A | Discharge status: Home / Self Care | Payer: Medicare HMO | Attending: Internal Medicine | Admitting: Internal Medicine

## 2022-12-04 ENCOUNTER — Ambulatory Visit (HOSPITAL_BASED_OUTPATIENT_CLINIC_OR_DEPARTMENT_OTHER): Payer: Medicare HMO | Admitting: Certified Registered Nurse Anesthetist

## 2022-12-04 ENCOUNTER — Ambulatory Visit (HOSPITAL_COMMUNITY): Payer: Medicare HMO | Admitting: Certified Registered Nurse Anesthetist

## 2022-12-04 DIAGNOSIS — Z950 Presence of cardiac pacemaker: Secondary | ICD-10-CM | POA: Insufficient documentation

## 2022-12-04 DIAGNOSIS — K219 Gastro-esophageal reflux disease without esophagitis: Secondary | ICD-10-CM | POA: Diagnosis not present

## 2022-12-04 DIAGNOSIS — I4819 Other persistent atrial fibrillation: Secondary | ICD-10-CM | POA: Diagnosis not present

## 2022-12-04 DIAGNOSIS — K30 Functional dyspepsia: Secondary | ICD-10-CM | POA: Diagnosis not present

## 2022-12-04 DIAGNOSIS — F418 Other specified anxiety disorders: Secondary | ICD-10-CM

## 2022-12-04 DIAGNOSIS — I4891 Unspecified atrial fibrillation: Secondary | ICD-10-CM | POA: Diagnosis not present

## 2022-12-04 DIAGNOSIS — Z79899 Other long term (current) drug therapy: Secondary | ICD-10-CM | POA: Diagnosis not present

## 2022-12-04 DIAGNOSIS — I10 Essential (primary) hypertension: Secondary | ICD-10-CM | POA: Diagnosis not present

## 2022-12-04 DIAGNOSIS — E7800 Pure hypercholesterolemia, unspecified: Secondary | ICD-10-CM | POA: Diagnosis not present

## 2022-12-04 DIAGNOSIS — E669 Obesity, unspecified: Secondary | ICD-10-CM | POA: Diagnosis not present

## 2022-12-04 DIAGNOSIS — R69 Illness, unspecified: Secondary | ICD-10-CM | POA: Diagnosis not present

## 2022-12-04 DIAGNOSIS — Z7901 Long term (current) use of anticoagulants: Secondary | ICD-10-CM | POA: Insufficient documentation

## 2022-12-04 DIAGNOSIS — I484 Atypical atrial flutter: Secondary | ICD-10-CM | POA: Insufficient documentation

## 2022-12-04 DIAGNOSIS — Z6833 Body mass index (BMI) 33.0-33.9, adult: Secondary | ICD-10-CM | POA: Diagnosis not present

## 2022-12-04 DIAGNOSIS — F32A Depression, unspecified: Secondary | ICD-10-CM | POA: Insufficient documentation

## 2022-12-04 DIAGNOSIS — M1990 Unspecified osteoarthritis, unspecified site: Secondary | ICD-10-CM

## 2022-12-04 HISTORY — PX: CARDIOVERSION: SHX1299

## 2022-12-04 SURGERY — CARDIOVERSION
Anesthesia: General

## 2022-12-04 MED ORDER — LIDOCAINE 2% (20 MG/ML) 5 ML SYRINGE
INTRAMUSCULAR | Status: DC | PRN
  Administered 2022-12-04: 60 mg via INTRAVENOUS

## 2022-12-04 MED ORDER — PROPOFOL 10 MG/ML IV BOLUS
INTRAVENOUS | Status: DC | PRN
  Administered 2022-12-04: 40 mg via INTRAVENOUS

## 2022-12-04 MED ORDER — SODIUM CHLORIDE 0.9 % IV SOLN: INTRAVENOUS | Status: DC

## 2022-12-04 NOTE — Interval H&P Note (Signed)
History and Physical Interval Note:  12/04/2022 9:11 AM  [name]  has presented today for surgery, with the diagnosis of AFIB.  The various methods of treatment have been discussed with the patient and family. After consideration of risks, benefits and other options for treatment, the patient has consented to  Procedure(s): CARDIOVERSION (N/A) as a surgical intervention.  The patient's history has been reviewed, patient examined, no change in status, stable for surgery.  I have reviewed the patient's chart and labs.  Questions were answered to the patient's satisfaction.     [name]

## 2022-12-04 NOTE — Transfer of Care (Signed)
Immediate Anesthesia Transfer of Care Note  Patient: [name]  Procedure(s) Performed: CARDIOVERSION  Patient Location: Endoscopy Unit  Anesthesia Type:General  Level of Consciousness: drowsy  Airway & Oxygen Therapy: Patient Spontanous Breathing  Post-op Assessment: Report given to RN and Post -op Vital signs reviewed and stable  Post vital signs: Reviewed and stable  Last Vitals:  Vitals Value Taken Time  BP    Temp    Pulse    Resp    SpO2      Last Pain:  Vitals:   12/04/22 0838  TempSrc: Temporal  PainSc: 0-No pain         Complications: No notable events documented.

## 2022-12-04 NOTE — Discharge Instructions (Signed)

## 2022-12-04 NOTE — Anesthesia Preprocedure Evaluation (Addendum)
Anesthesia Evaluation  Patient identified by MRN, date of birth, ID band Patient awake    Reviewed: Allergy & Precautions, NPO status , Patient's Chart, lab work & pertinent test results  Airway Mallampati: II  TM Distance: >3 FB Neck ROM: Full    Dental no notable dental hx. (+) Teeth Intact, Caps, Dental Advisory Given   Pulmonary neg pulmonary ROS   Pulmonary exam normal breath sounds clear to auscultation       Cardiovascular hypertension, Normal cardiovascular exam+ dysrhythmias Atrial Fibrillation + pacemaker  Rhythm:Irregular Rate:Normal     Neuro/Psych  Headaches PSYCHIATRIC DISORDERS Anxiety Depression     negative psych ROS   GI/Hepatic negative GI ROS, Neg liver ROS,GERD  ,,  Endo/Other  negative endocrine ROS    Renal/GU negative Renal ROS  negative genitourinary   Musculoskeletal  (+) Arthritis ,    Abdominal   Peds  Hematology negative hematology ROS (+)   Anesthesia Other Findings   Reproductive/Obstetrics                             Anesthesia Physical Anesthesia Plan  ASA: 3  Anesthesia Plan: General   Post-op Pain Management:    Induction: Intravenous  PONV Risk Score and Plan: 3 and Propofol infusion and Treatment may vary due to age or medical condition  Airway Management Planned: Natural Airway  Additional Equipment:   Intra-op Plan:   Post-operative Plan:   Informed Consent: I have reviewed the patients History and Physical, chart, labs and discussed the procedure including the risks, benefits and alternatives for the proposed anesthesia with the patient or authorized representative who has indicated his/her understanding and acceptance.     Dental advisory given  Plan Discussed with: CRNA  Anesthesia Plan Comments:        Anesthesia Quick Evaluation

## 2022-12-04 NOTE — CV Procedure (Signed)
Procedure: Electrical Cardioversion Indications:  Atrial Fibrillation  Procedure Details:  Consent: Risks of procedure as well as the alternatives and risks of each were explained to the (patient/caregiver).  Consent for procedure obtained.  Time Out: Verified patient identification, verified procedure, site/side was marked, verified correct patient position, special equipment/implants available, medications/allergies/relevent history reviewed, required imaging and test results available. PERFORMED.  Patient placed on cardiac monitor, pulse oximetry, supplemental oxygen as necessary.  Sedation given:  propofol per anesthesia Pacer pads placed anterior and posterior chest.  Cardioverted 1 time(s).  Cardioversion with synchronized biphasic 120J shock.  Evaluation: Findings: Post procedure EKG shows:  APVP Complications: None Patient did tolerate procedure well.  Time Spent Directly with the Patient:  30 minutes   [name] 12/04/2022, 9:38 AM

## 2022-12-06 NOTE — Anesthesia Postprocedure Evaluation (Signed)
Anesthesia Post Note  Patient: [name]  Procedure(s) Performed: CARDIOVERSION     Patient location during evaluation: Endoscopy Anesthesia Type: General Level of consciousness: awake and alert Pain management: pain level controlled Vital Signs Assessment: post-procedure vital signs reviewed and stable Respiratory status: spontaneous breathing, nonlabored ventilation, respiratory function stable and patient connected to nasal cannula oxygen Cardiovascular status: blood pressure returned to baseline and stable Postop Assessment: no apparent nausea or vomiting Anesthetic complications: no  No notable events documented.  Last Vitals:  Vitals:   12/04/22 0950 12/04/22 1000  BP: 115/65 115/73  Pulse: 61 62  Resp: 14 16  Temp:    SpO2: 94% 95%    Last Pain:  Vitals:   12/05/22 1534  TempSrc:   PainSc: 0-No pain                 [name] [name]

## 2022-12-07 ENCOUNTER — Encounter (HOSPITAL_COMMUNITY): Payer: Self-pay | Admitting: Internal Medicine

## 2022-12-11 ENCOUNTER — Ambulatory Visit (INDEPENDENT_AMBULATORY_CARE_PROVIDER_SITE_OTHER): Payer: Medicare HMO | Admitting: Nurse Practitioner

## 2022-12-11 ENCOUNTER — Encounter: Payer: Self-pay | Admitting: Nurse Practitioner

## 2022-12-11 VITALS — BP 118/76 | HR 79 | Temp 98.4°F | Ht 62.0 in | Wt 185.8 lb

## 2022-12-11 DIAGNOSIS — M48062 Spinal stenosis, lumbar region with neurogenic claudication: Secondary | ICD-10-CM

## 2022-12-11 DIAGNOSIS — M810 Age-related osteoporosis without current pathological fracture: Secondary | ICD-10-CM | POA: Diagnosis not present

## 2022-12-11 DIAGNOSIS — I480 Paroxysmal atrial fibrillation: Secondary | ICD-10-CM

## 2022-12-11 DIAGNOSIS — E785 Hyperlipidemia, unspecified: Secondary | ICD-10-CM

## 2022-12-11 DIAGNOSIS — R69 Illness, unspecified: Secondary | ICD-10-CM | POA: Diagnosis not present

## 2022-12-11 DIAGNOSIS — I10 Essential (primary) hypertension: Secondary | ICD-10-CM

## 2022-12-11 DIAGNOSIS — F5101 Primary insomnia: Secondary | ICD-10-CM

## 2022-12-11 NOTE — Progress Notes (Unsigned)
Careteam: Patient Care Team: Lauree Chandler, NP as PCP - General (Geriatric Medicine) Martinique, Peter M, MD as PCP - Cardiology (Cardiology) Mcarthur Rossetti, MD as Attending Physician (Orthopedic Surgery) Larey Dresser, MD as Attending Physician (Cardiology) Thornell Sartorius, MD as Consulting Physician (Otolaryngology) Druscilla Brownie, MD as Consulting Physician (Dermatology) Calvert Cantor, MD as Consulting Physician (Ophthalmology) Debara Pickett Nadean Corwin, MD as Consulting Physician (Cardiology) Eustace Moore, MD as Consulting Physician (Neurosurgery)  PLACE OF SERVICE:  [facility]  Advanced Directive information Does Patient Have a Medical Advance Directive?: Yes, Type of Advance Directive: [place];Out of facility DNR (pink MOST or yellow form), Pre-existing out of facility DNR order (yellow form or pink MOST form): Yellow form placed in chart (order not valid for inpatient use);Pink MOST form placed in chart (order not valid for inpatient use)  Allergies  Allergen Reactions   Advil [Ibuprofen]     GI upset   Aleve [Naproxen Sodium]     GI upset   Aspirin Nausea And Vomiting    Takes baby aspirin qod without problems   Atorvastatin     Leg cramps   Celebrex [Celecoxib]     GI upset   Diclofenac     GI upset   Doxycycline     Headache   Nsaids Other (See Comments)    Tears my stomach up    Other     Antihistamine - increase BP   Pravastatin     Leg cramps   Timolol     Dropped HR   Metoclopramide Hcl Palpitations    Chief Complaint  Patient presents with   Medical Management of Chronic Issues    6 month follow-up. Discuss need for additional covid boosters and AWV (pending for 02/06/22). Moderate fall risk. Discuss trazodone, patient questions relation of a-fib side effects      HPI: Patient is a 88 y.o. female for routine follow up.   In October she started having worsening shortness of breath, fatigue- saw Dr Lovena Le who told  her she was back in a fib and sent her to [facility] for a cardioversion to put her back in SR.   She is on trazodone and takes occasionally- will only use 1/4 of a tablet if she can not sleep.   She still has some shortness of breath but still able to do the things she is able to do. She was walking but now has cut back  She wants to restart her walking and feels up to it.    Review of Systems:  Review of Systems  Constitutional:  Negative for chills, fever and weight loss.  HENT:  Negative for tinnitus.   Respiratory:  Negative for cough, sputum production and shortness of breath.   Cardiovascular:  Negative for chest pain, palpitations and leg swelling.  Gastrointestinal:  Negative for abdominal pain, constipation, diarrhea and heartburn.  Genitourinary:  Negative for dysuria, frequency and urgency.  Musculoskeletal:  Negative for back pain, falls, joint pain and myalgias.  Skin: Negative.   Neurological:  Negative for dizziness and headaches.  Psychiatric/Behavioral:  Negative for depression and memory loss. The patient does not have insomnia.     Past Medical History:  Diagnosis Date   Allergic rhinitis due to pollen    Anxiety state, unspecified    Sitautional- pain   Arthritis    "right knee" (06/21/2016)   Asymptomatic varicose veins    Carpal tunnel syndrome    Chest pain, unspecified  Chronic lower back pain    "on the left side" (06/21/2016)   Constipation    Cramp of limb    Depressive disorder, not elsewhere classified    Diaphragmatic hernia without mention of obstruction or gangrene    Diaphragmatic hernia without mention of obstruction or gangrene    Disturbance of skin sensation    Diverticulosis of colon (without mention of hemorrhage)    Dysrhythmia    Atrial Flutter   Enthesopathy of hip region    Esophageal reflux    , just occasional   History of blood transfusion    "w/both knee replacements"   History of duodenal ulcer    History of kidney stones     passed   Insomnia, unspecified    Lumbago    Migraine    "none since ~ 1990" (06/21/2016)   Myalgia and myositis, unspecified    Obesity, unspecified    Other abnormal blood chemistry    Other dysphagia    Other malaise and fatigue    Other nonspecific abnormal serum enzyme levels    Other specified cardiac dysrhythmias(427.89)    Pacemaker    Pain in joint, ankle and foot    Pain in joint, hand    Pain in joint, lower leg    Pain in joint, pelvic region and thigh    Pain in limb    Plantar fascial fibromatosis    Presence of permanent cardiac pacemaker    -[employer]   Reflux esophagitis    Sciatica    Spinal stenosis, unspecified region other than cervical    Stricture and stenosis of esophagus    Symptomatic menopausal or female climacteric states    Unspecified essential hypertension    Unspecified essential hypertension    Unspecified vitamin D deficiency    Past Surgical History:  Procedure Laterality Date   BREAST BIOPSY Left 1990s X 2   CARDIOVERSION N/A 04/26/2016   Procedure: CARDIOVERSION;  Surgeon: Pixie Casino, MD;  Location: [facility] ENDOSCOPY;  Service: Cardiovascular;  Laterality: N/A;   CARDIOVERSION N/A 12/04/2022   Procedure: CARDIOVERSION;  Surgeon: [name], MD;  Location: [facility];  Service: Cardiovascular;  Laterality: N/A;   CATARACT EXTRACTION W/ INTRAOCULAR LENS IMPLANT Left 08/03/1999   DR EPES    CATARACT EXTRACTION W/ INTRAOCULAR LENS IMPLANT Right 2000   DR EPES   CHOLECYSTECTOMY OPEN  1989   DR BOWMAN   COLONOSCOPY  1988   Normal    DENTAL SURGERY Left 08/2016   EP IMPLANTABLE DEVICE N/A 06/21/2016   Procedure: Pacemaker Implant;  Surgeon: Evans Lance, MD;  Location: [facility] CV LAB;  Service: Cardiovascular;  Laterality: N/A;   ESOPHAGOGASTRODUODENOSCOPY (EGD) WITH ESOPHAGEAL DILATION  ~ 1982   Dr. Sharlett Iles   EXCISION OF ACTINIC KERATOSIS     DR LUPTON    EYE SURGERY     INSERT / [place] / REMOVE PACEMAKER     JOINT  REPLACEMENT     KNEE ARTHROSCOPY Left 2003   KNEE ARTHROSCOPY Right 06/26/2013   KNEE CLOSED REDUCTION Right 10/23/2013   Procedure: CLOSED MANIPULATION RIGHT KNEE;  Surgeon: Mcarthur Rossetti, MD;  Location: [facility];  Service: Orthopedics;  Laterality: Right;   LASER FOR CLOUDY CAP LEFT EYE Left 03/2006   DR DIGBY   LUMBAR LAMINECTOMY/DECOMPRESSION MICRODISCECTOMY N/A 06/12/2019   Procedure: Laminectomy and Foraminotomy - Lumbar four-Lumbar five;  Surgeon: Eustace Moore, MD;  Location: [facility];  Service: Neurosurgery;  Laterality: N/A;  [facility], Dr.Gray. Removed area on upper chest   SKIN SURGERY     May 2023, upper left chest area, perfored by the skin surgery center   TOTAL KNEE ARTHROPLASTY Left 04/2004   DR RENDALL   TOTAL KNEE ARTHROPLASTY Right 07/04/2013   Procedure: RIGHT TOTAL KNEE ARTHROPLASTY;  Surgeon: Mcarthur Rossetti, MD;  Location: [facility]S;  Service: Orthopedics;  Laterality: Right;   TRIGGER FINGER RELEASE Right 09/2018   VAGINAL HYSTERECTOMY  1979   Social History:   reports that she has never smoked. She has never used smokeless tobacco. She reports that she does not drink alcohol and does not use drugs.  Family History  Problem Relation Age of Onset   Ovarian cancer Mother    Uterine cancer Mother    Cerebrovascular Accident Mother    Heart disease Father    Hypertension Brother    Obesity Daughter     Medications: Patient's Medications  New Prescriptions   No medications on file  Previous Medications   ACETAMINOPHEN (TYLENOL) 500 MG TABLET    Take 500 mg by mouth every 6 (six) hours as needed for moderate pain.   ALENDRONATE (FOSAMAX) 70 MG TABLET    Take 1 tablet (70 mg total) by mouth every 7 (seven) days. Take with a full glass of water on an empty stomach.   ANTISEPTIC ORAL RINSE (BIOTENE) LIQD    15 mLs by Mouth Rinse route 2 (two) times daily.   ARTIFICIAL SALIVA (ACT DRY MOUTH) LOZG    Use as directed 1 drop in  the mouth or throat as needed (dry mouth).   BIMATOPROST (LUMIGAN) 0.01 % SOLN    Place 1 drop into both eyes at bedtime.    CALCIUM CITRATE-VITAMIN D (CALCIUM + D PO)    Take 1 tablet by mouth daily.   CHOLECALCIFEROL (VITAMIN D) 125 MCG (5000 UT) CAPS    Take 5,000 Units by mouth daily.   DORZOLAMIDE (TRUSOPT) 2 % OPHTHALMIC SOLUTION    Place 1 drop into both eyes 3 (three) times daily.   ELIQUIS 5 MG TABS TABLET    TAKE 1 TABLET BY MOUTH TWICE A DAY   ESOMEPRAZOLE (NEXIUM) 20 MG CAPSULE    TAKE 1 CAPSULE (20 MG TOTAL) BY MOUTH AS NEEDED.   EZETIMIBE (ZETIA) 10 MG TABLET    TAKE ONE TABLET BY MOUTH ONCE DAILY   METHYLCELLULOSE (ARTIFICIAL TEARS) 1 % OPHTHALMIC SOLUTION    Place 1 drop into both eyes daily as needed (for dry eyes).    METOPROLOL TARTRATE (LOPRESSOR) 25 MG TABLET    TAKE 1 TABLET BY MOUTH TWICE A DAY   OVER THE COUNTER MEDICATION    Take 1 capsule by mouth 2 (two) times daily. [facility] supplement   PROPAFENONE (RYTHMOL SR) 225 MG 12 HR CAPSULE    TAKE 1 CAPSULE BY MOUTH TWICE A DAY   TRAZODONE (DESYREL) 50 MG TABLET    Take 50 mg by mouth at bedtime as needed for sleep.   TROLAMINE SALICYLATE (ASPERCREME) 10 % CREAM    Apply 1 application topically as needed for muscle pain (for knee pain).   VALSARTAN-HYDROCHLOROTHIAZIDE (DIOVAN-HCT) 320-25 MG TABLET    TAKE ONE TABLET BY MOUTH DAILY  Modified Medications   No medications on file  Discontinued Medications   METHYLPREDNISOLONE (MEDROL) 4 MG TABLET    Take as directed   TRAZODONE (DESYREL) 50 MG TABLET    TAKE 1 TABLET BY MOUTH  EVERYDAY AT BEDTIME    Physical Exam:  Vitals:   12/11/22 1125  BP: 118/76  Pulse: 79  Temp: 98.4 F (36.9 C)  TempSrc: Temporal  SpO2: 97%  Weight: 185 lb 12.8 oz (84.3 kg)  Height: '5\' 2"'$  (1.575 m)   Body mass index is 33.98 kg/m. Wt Readings from Last 3 Encounters:  12/11/22 185 lb 12.8 oz (84.3 kg)  12/04/22 185 lb 6.5 oz (84.1 kg)  11/20/22 185 lb 8 oz (84.1 kg)    Physical  Exam Constitutional:      General: She is not in acute distress.    Appearance: She is well-developed. She is not diaphoretic.  HENT:     Head: Normocephalic and atraumatic.     Mouth/Throat:     Pharynx: No oropharyngeal exudate.  Eyes:     Conjunctiva/sclera: Conjunctivae normal.     Pupils: Pupils are equal, round, and reactive to light.  Cardiovascular:     Rate and Rhythm: Normal rate and regular rhythm.     Heart sounds: Normal heart sounds.  Pulmonary:     Effort: Pulmonary effort is normal.     Breath sounds: Normal breath sounds.  Abdominal:     General: Bowel sounds are normal.     Palpations: Abdomen is soft.  Musculoskeletal:     Cervical back: Normal range of motion and neck supple.     Right lower leg: No edema.     Left lower leg: No edema.  Skin:    General: Skin is warm and dry.  Neurological:     Mental Status: She is alert.  Psychiatric:        Mood and Affect: Mood normal.     Labs reviewed: Basic Metabolic Panel: Recent Labs    07/17/22 1436 11/27/22 1200  NA 139 141  K 3.7 4.1  CL 103 104  CO2 26 23  GLUCOSE 101 97  BUN 17 19  CREATININE 0.68 0.80  CALCIUM 9.2 9.3  TSH 1.76  --    Liver Function Tests: Recent Labs    07/17/22 1436  AST 16  ALT 16  BILITOT 0.4  PROT 6.6   No results for input(s): "LIPASE", "AMYLASE" in the last 8760 hours. No results for input(s): "AMMONIA" in the last 8760 hours. CBC: Recent Labs    07/17/22 1436 11/27/22 1200  WBC 6.0 5.0  NEUTROABS 3,294 2.9  HGB 13.0 12.1  HCT 38.7 37.0  MCV 92.1 94  PLT 190 184   Lipid Panel: Recent Labs    07/17/22 1436  CHOL 188  HDL 36*  LDLCALC 106*  TRIG 347*  CHOLHDL 5.2*   TSH: Recent Labs    07/17/22 1436  TSH 1.76   A1C: Lab Results  Component Value Date   HGBA1C 5.2 08/06/2017     Assessment/Plan 1. Paroxysmal atrial fibrillation (HCC) -rate controlled, continues on propafenone, on eliquis for anticoagulation  2. Essential  hypertension -Blood pressure well controlled, goal bp <140/90 Continue current medications and dietary modifications follow metabolic panel  3. Spinal stenosis of lumbar region with neurogenic claudication -stable, without increase in pain, continues on tylenol PRN  4. Hyperlipidemia, unspecified hyperlipidemia type -continues on zetia daily   5. Osteoporosis, unspecified osteoporosis type, unspecified pathological fracture presence -Recommended to take calcium 600 mg twice daily with Vitamin D 2000 units daily and weight bearing activity 30 mins/5 days a week with fosamax weekly  6. Primary insomnia -stable, uses trazodone if needed    Return in  about 6 months (around 06/12/2023) for routine follow up . Carlos American. [place], [facility] [phone]

## 2022-12-19 ENCOUNTER — Ambulatory Visit (INDEPENDENT_AMBULATORY_CARE_PROVIDER_SITE_OTHER): Payer: Medicare HMO

## 2022-12-19 DIAGNOSIS — I495 Sick sinus syndrome: Secondary | ICD-10-CM

## 2022-12-19 LAB — CUP PACEART REMOTE DEVICE CHECK
Battery Remaining Longevity: 37 mo
Battery Remaining Percentage: 39 %
Battery Voltage: 2.96 V
Brady Statistic AP VP Percent: 8.2 %
Brady Statistic AP VS Percent: 36 %
Brady Statistic AS VP Percent: 24 %
Brady Statistic AS VS Percent: 31 %
Brady Statistic RA Percent Paced: 46 %
Brady Statistic RV Percent Paced: 32 %
Date Time Interrogation Session: 20231226020021
Implantable Lead Connection Status: 753985
Implantable Lead Connection Status: 753985
Implantable Lead Implant Date: 20170628
Implantable Lead Implant Date: 20170628
Implantable Lead Location: 753859
Implantable Lead Location: 753860
Implantable Pulse Generator Implant Date: 20170628
Lead Channel Impedance Value: 450 Ohm
Lead Channel Impedance Value: 550 Ohm
Lead Channel Pacing Threshold Amplitude: 0.5 V
Lead Channel Pacing Threshold Amplitude: 1.75 V
Lead Channel Pacing Threshold Pulse Width: 0.5 ms
Lead Channel Pacing Threshold Pulse Width: 0.5 ms
Lead Channel Sensing Intrinsic Amplitude: 1.5 mV
Lead Channel Sensing Intrinsic Amplitude: 7.7 mV
Lead Channel Setting Pacing Amplitude: 2 V
Lead Channel Setting Pacing Amplitude: 3 V
Lead Channel Setting Pacing Pulse Width: 0.5 ms
Lead Channel Setting Sensing Sensitivity: 2 mV
Pulse Gen Model: 2272
Pulse Gen Serial Number: 7910313

## 2022-12-24 NOTE — Progress Notes (Unsigned)
Cardiology Office Note   Date:  12/24/2022   ID:  Tane, Biegler 07-20-1931, MRN [id number]  PCP:  Lauree Chandler, NP  Cardiologist:   Annabeth Tortora [place], MD   No chief complaint on file.      History of Present Illness: [name] is a 88 y.o. female is seen for follow up Afib and dyspnea. She has a long history of marked sinus bradycardia and atrial fibrillation/flutter. She was seen initially in 2014. Echo at that time showed mild LAE otherwise normal. Holter showed mean HR 59 with lowest HR 43 and peak HR 109.    On March 9,2017 she noted an increased HR by BP monitor up to 153. At this time she felt marked indigestion from her waist to her neck. She felt her heart fluttering.   She was found to be in Afib with RVR. She was started on Eliquis and metoprolol. Myoview study and Echo were normal.   She later had attempt at DCCV. She was in an atypical atrial flutter at that time. DCCV resulted in very prolonged pauses > 6 seconds and return to flutter. She was seen by Dr. Curt Bears and placed on flecainide. This did convert her to NSR but made her feel very sick with nausea, dizziness, and extreme fatigue. Flecainide was discontinued.. She continued to have marked bradycardia and underwent PPM placement on 06/21/16. When seen in September 2018 by Dr. Lovena Le she had an Afib burden of 94%. She was started on propafenone for her Afib. Initially this medication caused her to have more nausea but this has improved.  On her subsequent  checks her Afib burden was less than 1% since December 2019.   More recently seen by Dr Lovena Le on 11/17. By pacer check she was in Afib continually. Rate controlled.   On follow up she noted some increased SOB and fatigue over the past month or two. Really is not aware of palpitations. No edema. No chest pain. She did undergo DCCV on 12/04/22. On follow up pacer check on Dec 28 she was maintaining NSR.       Past Medical History:  Diagnosis Date    Allergic rhinitis due to pollen    Anxiety state, unspecified    Sitautional- pain   Arthritis    "right knee" (06/21/2016)   Asymptomatic varicose veins    Carpal tunnel syndrome    Chest pain, unspecified    Chronic lower back pain    "on the left side" (06/21/2016)   Constipation    Cramp of limb    Depressive disorder, not elsewhere classified    Diaphragmatic hernia without mention of obstruction or gangrene    Diaphragmatic hernia without mention of obstruction or gangrene    Disturbance of skin sensation    Diverticulosis of colon (without mention of hemorrhage)    Dysrhythmia    Atrial Flutter   Enthesopathy of hip region    Esophageal reflux    , just occasional   History of blood transfusion    "w/both knee replacements"   History of duodenal ulcer    History of kidney stones    passed   Insomnia, unspecified    Lumbago    Migraine    "none since ~ 1990" (06/21/2016)   Myalgia and myositis, unspecified    Obesity, unspecified    Other abnormal blood chemistry    Other dysphagia    Other malaise and fatigue    Other nonspecific abnormal serum enzyme  levels    Other specified cardiac dysrhythmias(427.89)    Pacemaker    Pain in joint, ankle and foot    Pain in joint, hand    Pain in joint, lower leg    Pain in joint, pelvic region and thigh    Pain in limb    Plantar fascial fibromatosis    Presence of permanent cardiac pacemaker    -[employer]   Reflux esophagitis    Sciatica    Spinal stenosis, unspecified region other than cervical    Stricture and stenosis of esophagus    Symptomatic menopausal or female climacteric states    Unspecified essential hypertension    Unspecified essential hypertension    Unspecified vitamin D deficiency     Past Surgical History:  Procedure Laterality Date   BREAST BIOPSY Left 1990s X 2   CARDIOVERSION N/A 04/26/2016   Procedure: CARDIOVERSION;  Surgeon: Pixie Casino, MD;  Location: [facility] END[facility]COPY;  Service:  Cardiovascular;  Laterality: N/A;   CARDIOVERSION N/A 12/04/2022   Procedure: CARDIOVERSION;  Surgeon: [name], MD;  Location: [facility] ENDOSCOPY;  Service: Cardiovascular;  Laterality: N/A;   CATARACT EXTRACTION W/ INTRAOCULAR LENS IMPLANT Left 08/03/1999   DR EPES    CATARACT EXTRACTION W/ INTRAOCULAR LENS IMPLANT Right 2000   DR EPES   CHOLECYSTECTOMY OPEN  1989   DR BOWMAN   COLONOSCOPY  1988   Normal    DENTAL SURGERY Left 08/2016   EP IMPLANTABLE DEVICE N/A 06/21/2016   Procedure: Pacemaker Implant;  Surgeon: Evans Lance, MD;  Location: [place] [facility];  Service: Cardiovascular;  Laterality: N/A;   ESOPHAGOGASTRODUODENOSCOPY (EGD) WITH ESOPHAGEAL DILATION  ~ 1982   Dr. Sharlett Iles   EXCISION OF ACTINIC KERATOSIS     DR LUPTON    EYE SURGERY     INSERT / [place] / REMOVE PACEMAKER     JOINT REPLACEMENT     KNEE ARTHROSCOPY Left 2003   KNEE ARTHROSCOPY Right 06/26/2013   KNEE CLOSED REDUCTION Right 10/23/2013   Procedure: CLOSED MANIPULATION RIGHT KNEE;  Surgeon: Mcarthur Rossetti, MD;  Location: [place];  Service: Orthopedics;  Laterality: Right;   LASER FOR CLOUDY CAP LEFT EYE Left 03/2006   DR DIGBY   LUMBAR LAMINECTOMY/DECOMPRESSION MICRODISCECTOMY N/A 06/12/2019   Procedure: Laminectomy and Foraminotomy - Lumbar four-Lumbar five;  Surgeon: Eustace Moore, MD;  Location: [facility];  Service: Neurosurgery;  Laterality: N/A;   [facility], Dr.Gray. Removed area on upper chest   SKIN SURGERY     May 2023, upper left chest area, perfored by the skin surgery center   TOTAL KNEE ARTHROPLASTY Left 04/2004   DR RENDALL   TOTAL KNEE ARTHROPLASTY Right 07/04/2013   Procedure: RIGHT TOTAL KNEE ARTHROPLASTY;  Surgeon: Mcarthur Rossetti, MD;  Location: [facility];  Service: Orthopedics;  Laterality: Right;   TRIGGER FINGER RELEASE Right 09/2018   VAGINAL HYSTERECTOMY  1979     Current Outpatient Medications  Medication Sig Dispense Refill    acetaminophen (TYLENOL) 500 MG tablet Take 500 mg by mouth every 6 (six) hours as needed for moderate pain.     alendronate (FOSAMAX) 70 MG tablet Take 1 tablet (70 mg total) by mouth every 7 (seven) days. Take with a full glass of water on an empty stomach. 4 tablet 11   antiseptic oral rinse (BIOTENE) LIQD 15 mLs by Mouth Rinse route 2 (two) times daily.     Artificial Saliva (ACT  DRY MOUTH) LOZG Use as directed 1 drop in the mouth or throat as needed (dry mouth).     bimatoprost (LUMIGAN) 0.01 % SOLN Place 1 drop into both eyes at bedtime.      Calcium Citrate-Vitamin D (CALCIUM + D PO) Take 1 tablet by mouth daily.     Cholecalciferol (VITAMIN D) 125 MCG (5000 UT) CAPS Take 5,000 Units by mouth daily.     dorzolamide (TRUSOPT) 2 % ophthalmic solution Place 1 drop into both eyes 3 (three) times daily.     ELIQUIS 5 MG TABS tablet TAKE 1 TABLET BY MOUTH TWICE A DAY 180 tablet 1   esomeprazole (NEXIUM) 20 MG capsule TAKE 1 CAPSULE (20 MG TOTAL) BY MOUTH AS NEEDED. 90 capsule 1   ezetimibe (ZETIA) 10 MG tablet TAKE ONE TABLET BY MOUTH ONCE DAILY 90 tablet 3   methylcellulose (ARTIFICIAL TEARS) 1 % ophthalmic solution Place 1 drop into both eyes daily as needed (for dry eyes).      metoprolol tartrate (LOPRESSOR) 25 MG tablet TAKE 1 TABLET BY MOUTH TWICE A DAY 180 tablet 0   OVER THE COUNTER MEDICATION Take 1 capsule by mouth 2 (two) times daily. HydroEye supplement     propafenone (RYTHMOL SR) 225 MG 12 hr capsule TAKE 1 CAPSULE BY MOUTH TWICE A DAY 180 capsule 2   traZODone (DESYREL) 50 MG tablet Take 50 mg by mouth at bedtime as needed for sleep.     trolamine salicylate (ASPERCREME) 10 % cream Apply 1 application topically as needed for muscle pain (for knee pain).     valsartan-hydrochlorothiazide (DIOVAN-HCT) 320-25 MG tablet TAKE ONE TABLET BY MOUTH DAILY 90 tablet 1   No current facility-administered medications for this visit.    Allergies:   Advil [ibuprofen], Aleve [naproxen sodium],  Aspirin, Atorvastatin, Celebrex [celecoxib], Diclofenac, Doxycycline, Nsaids, Other, Pravastatin, Timolol, and Metoclopramide hcl    Social History:  The patient  reports that she has never smoked. She has never used smokeless tobacco. She reports that she does not drink alcohol and does not use drugs.   Family History:  The patient's family history includes Cerebrovascular Accident in her mother; Heart disease in her father; Hypertension in her brother; Obesity in her daughter; Ovarian cancer in her mother; Uterine cancer in her mother.    ROS:  Please see the history of present illness.   Otherwise, review of systems are positive for none.   All other systems are reviewed and negative.    PHYSICAL EXAM: VS:  There were no vitals taken for this visit. , BMI There is no height or weight on file to calculate BMI. GENERAL:  Well appearing, obese WF in NAD HEENT:  PERRL, EOMI, sclera are clear. Oropharynx is clear. NECK:  No jugular venous distention, carotid upstroke brisk and symmetric, no bruits, no thyromegaly or adenopathy LUNGS:  clear  CHEST:  Unremarkable HEART:  IRRR,  PMI not displaced or sustained,S1 and S2 within normal limits, no S3, no S4: no clicks, no rubs, no murmurs ABD:  Soft, nontender. BS +, no masses or bruits. No hepatomegaly, no splenomegaly EXT:  2 + pulses throughout, no edema, no cyanosis no clubbing SKIN:  Warm and dry.  No rashes NEURO:  Alert and oriented x 3. Cranial nerves II through XII intact. PSYCH:  Cognitively intact    EKG:  EKG is ordered today. Afib rate 94. Nonspecific ST-T abnormality. Normal QTc. I have personally reviewed and interpreted this study.   Recent Labs: 07/17/2022:  ALT 16; TSH 1.76 11/27/2022: BUN 19; Creatinine, Ser 0.80; Hemoglobin 12.1; Platelets 184; Potassium 4.1; Sodium 141    Lipid Panel    Component Value Date/Time   CHOL 188 07/17/2022 1436   CHOL 179 03/27/2016 0853   TRIG 347 (H) 07/17/2022 1436   HDL 36 (L)  07/17/2022 1436   HDL 42 03/27/2016 0853   CHOLHDL 5.2 (H) 07/17/2022 1436   VLDL 42 (H) 08/06/2017 0918   LDLCALC 106 (H) 07/17/2022 1436      Wt Readings from Last 3 Encounters:  12/11/22 185 lb 12.8 oz (84.3 kg)  12/04/22 185 lb 6.5 oz (84.1 kg)  11/20/22 185 lb 8 oz (84.1 kg)      Other studies Reviewed: Additional studies/ records that were reviewed today include:  Echo: 09/06/17: Study Conclusions   - Left ventricle: The cavity size was normal. Wall thickness was   increased in a pattern of mild LVH. Systolic function was normal.   The estimated ejection fraction was in the range of 55% to 60%.   Doppler parameters are consistent with both elevated ventricular   end-diastolic filling pressure and elevated left atrial filling   pressure. - Left atrium: The atrium was moderately dilated. - Atrial septum: There was increased thickness of the septum,   consistent with lipomatous hypertrophy. No defect or patent   foramen ovale was identified  Echo 10/7//21: IMPRESSIONS     1. Left ventricular ejection fraction, by estimation, is 55 to 60%. The  left ventricle has normal function. The left ventricle has no regional  wall motion abnormalities. There is mild left ventricular hypertrophy.  Left ventricular diastolic parameters  are consistent with Grade I diastolic dysfunction (impaired relaxation).  The E/e' is 59.   2. Right ventricular systolic function is normal. The right ventricular  size is normal.   3. Left atrial size was moderately dilated.   4. The mitral valve is grossly normal. No evidence of mitral valve  regurgitation.   5. The aortic valve is tricuspid. There is mild calcification of the  aortic valve. Aortic valve regurgitation is not visualized.   6. The inferior vena cava is normal in size with greater than 50%  respiratory variability, suggesting right atrial pressure of 3 mmHg.   Comparison(s): No significant change from prior study.   ASSESSMENT  AND PLAN:  1.   Atrial fibrillation/flutter with RVR.  Intolerant of flecainide.  s/p PPM placement for marked post conversion pauses and bradycardia.  Has been on  propafenone with very low Afib burden until  November when she developed sustained AFib.  Followed by Dr Lovena Le. She has been compliant with her medication. We discussed options for management including rate control only, switching to a different AAD such as amiodarone or Tikosyn. I would actually favor DCCV on propafenone. She has done extremely well on this for the past 6 years. If she cannot be converted or has early recurrence of afib then one of the other options will come into play. Will plan on DCCV on Dec 11. Procedure and risks discussed in detail and she is agreeable to proceed.   2. HTN well controlled.  3. Mild hypercholesterolemia.   Follow up post CV  Signed, Rangel Echeverri [place], [place]  12/24/2022 8:03 AM    [facility] [address] [place], [place], [address] Phone [phone], Fax [phone]

## 2022-12-25 ENCOUNTER — Other Ambulatory Visit: Payer: Self-pay | Admitting: Cardiology

## 2022-12-25 DIAGNOSIS — I4819 Other persistent atrial fibrillation: Secondary | ICD-10-CM

## 2022-12-28 ENCOUNTER — Ambulatory Visit: Payer: Medicare HMO | Attending: Cardiology | Admitting: Cardiology

## 2022-12-28 ENCOUNTER — Encounter: Payer: Self-pay | Admitting: Cardiology

## 2022-12-28 VITALS — BP 159/79 | HR 64 | Ht 62.0 in | Wt 186.6 lb

## 2022-12-28 DIAGNOSIS — Z950 Presence of cardiac pacemaker: Secondary | ICD-10-CM

## 2022-12-28 DIAGNOSIS — I495 Sick sinus syndrome: Secondary | ICD-10-CM | POA: Diagnosis not present

## 2022-12-28 DIAGNOSIS — I4819 Other persistent atrial fibrillation: Secondary | ICD-10-CM | POA: Diagnosis not present

## 2023-01-01 DIAGNOSIS — M79675 Pain in left toe(s): Secondary | ICD-10-CM | POA: Diagnosis not present

## 2023-01-01 DIAGNOSIS — M79674 Pain in right toe(s): Secondary | ICD-10-CM | POA: Diagnosis not present

## 2023-01-01 DIAGNOSIS — L84 Corns and callosities: Secondary | ICD-10-CM | POA: Diagnosis not present

## 2023-01-01 DIAGNOSIS — B351 Tinea unguium: Secondary | ICD-10-CM | POA: Diagnosis not present

## 2023-01-01 DIAGNOSIS — E1151 Type 2 diabetes mellitus with diabetic peripheral angiopathy without gangrene: Secondary | ICD-10-CM | POA: Diagnosis not present

## 2023-01-10 NOTE — Progress Notes (Signed)
Remote pacemaker transmission.   

## 2023-01-22 ENCOUNTER — Other Ambulatory Visit: Payer: Self-pay | Admitting: Cardiology

## 2023-01-22 ENCOUNTER — Other Ambulatory Visit: Payer: Self-pay | Admitting: Nurse Practitioner

## 2023-01-22 DIAGNOSIS — G4700 Insomnia, unspecified: Secondary | ICD-10-CM

## 2023-02-06 ENCOUNTER — Encounter: Payer: Self-pay | Admitting: Nurse Practitioner

## 2023-02-06 ENCOUNTER — Telehealth: Payer: Self-pay

## 2023-02-06 ENCOUNTER — Telehealth (INDEPENDENT_AMBULATORY_CARE_PROVIDER_SITE_OTHER): Payer: Medicare HMO | Admitting: Nurse Practitioner

## 2023-02-06 ENCOUNTER — Encounter: Payer: Self-pay | Admitting: Physician Assistant

## 2023-02-06 ENCOUNTER — Ambulatory Visit: Payer: Medicare HMO | Admitting: Physician Assistant

## 2023-02-06 DIAGNOSIS — E2839 Other primary ovarian failure: Secondary | ICD-10-CM | POA: Diagnosis not present

## 2023-02-06 DIAGNOSIS — M76899 Other specified enthesopathies of unspecified lower limb, excluding foot: Secondary | ICD-10-CM

## 2023-02-06 DIAGNOSIS — Z0000 Encounter for general adult medical examination without abnormal findings: Secondary | ICD-10-CM | POA: Diagnosis not present

## 2023-02-06 MED ORDER — METHYLPREDNISOLONE 4 MG PO TBPK: ORAL_TABLET | ORAL | 0 refills | Status: DC

## 2023-02-06 NOTE — Telephone Encounter (Signed)
Ms. gabryel, dimock are scheduled for a virtual visit with your provider today.    Just as we do with appointments in the office, we must obtain your consent to participate.  Your consent will be active for this visit and any virtual visit you may have with one of our providers in the next 365 days.    If you have a MyChart account, I can also send a copy of this consent to you electronically.  All virtual visits are billed to your insurance company just like a traditional visit in the office.  As this is a virtual visit, video technology does not allow for your provider to perform a traditional examination.  This may limit your provider's ability to fully assess your condition.  If your provider identifies any concerns that need to be evaluated in person or the need to arrange testing such as labs, EKG, etc, we will make arrangements to do so.    Although advances in technology are sophisticated, we cannot ensure that it will always work on either your end or our end.  If the connection with a video visit is poor, we may have to switch to a telephone visit.  With either a video or telephone visit, we are not always able to ensure that we have a secure connection.   I need to obtain your verbal consent now.   Are you willing to proceed with your visit today?   KATHERLINE DAVIE has provided verbal consent on 02/06/2023 for a virtual visit (video or telephone).   Leigh Aurora [place], [place] 02/06/2023  10:59 AM

## 2023-02-06 NOTE — Patient Instructions (Signed)
[name] , Thank you for taking time to come for your Medicare Wellness Visit. I appreciate your ongoing commitment to your health goals. Please review the following plan we discussed and let me know if I can assist you in the future.   Screening recommendations/referrals: Colonoscopy aged out Mammogram up to date Bone Density ordered today Recommended yearly ophthalmology/optometry visit for glaucoma screening and checkup Recommended yearly dental visit for hygiene and checkup  Vaccinations: Influenza vaccine- due annually in September/October Pneumococcal vaccine up to date Tdap vaccine up to date Shingles vaccine up to date    Advanced directives: on file.   Conditions/risks identified: advanced age   Next appointment: yearly    Preventive Care 44 Years and Older, Female Preventive care refers to lifestyle choices and visits with your health care provider that can promote health and wellness. What does preventive care include? A yearly physical exam. This is also called an annual well check. Dental exams once or twice a year. Routine eye exams. Ask your health care provider how often you should have your eyes checked. Personal lifestyle choices, including: Daily care of your teeth and gums. Regular physical activity. Eating a healthy diet. Avoiding tobacco and drug use. Limiting alcohol use. Practicing safe sex. Taking low-dose aspirin every day. Taking vitamin and mineral supplements as recommended by your health care provider. What happens during an annual well check? The services and screenings done by your health care provider during your annual well check will depend on your age, overall health, lifestyle risk factors, and family history of disease. Counseling  Your health care provider may ask you questions about your: Alcohol use. Tobacco use. Drug use. Emotional well-being. Home and relationship well-being. Sexual activity. Eating habits. History of  falls. Memory and ability to understand (cognition). Work and work Statistician. Reproductive health. Screening  You may have the following tests or measurements: Height, weight, and BMI. Blood pressure. Lipid and cholesterol levels. These may be checked every 5 years, or more frequently if you are over 43 years old. Skin check. Lung cancer screening. You may have this screening every year starting at age 16 if you have a 30-pack-year history of smoking and currently smoke or have quit within the past 15 years. Fecal occult blood test (FOBT) of the stool. You may have this test every year starting at age 88. Flexible sigmoidoscopy or colonoscopy. You may have a sigmoidoscopy every 5 years or a colonoscopy every 10 years starting at age 64. Hepatitis C blood test. Hepatitis B blood test. Sexually transmitted disease (STD) testing. Diabetes screening. This is done by checking your blood sugar (glucose) after you have not eaten for a while (fasting). You may have this done every 1-3 years. Bone density scan. This is done to screen for osteoporosis. You may have this done starting at age 80. Mammogram. This may be done every 1-2 years. Talk to your health care provider about how often you should have regular mammograms. Talk with your health care provider about your test results, treatment options, and if necessary, the need for more tests. Vaccines  Your health care provider may recommend certain vaccines, such as: Influenza vaccine. This is recommended every year. Tetanus, diphtheria, and acellular pertussis (Tdap, Td) vaccine. You may need a Td booster every 10 years. Zoster vaccine. You may need this after age 87. Pneumococcal 13-valent conjugate (PCV13) vaccine. One dose is recommended after age 47. Pneumococcal polysaccharide (PPSV23) vaccine. One dose is recommended after age 80. Talk to your health care  provider about which screenings and vaccines you need and how often you need  them. This information is not intended to replace advice given to you by your health care provider. Make sure you discuss any questions you have with your health care provider. Document Released: 01/07/2016 Document Revised: 08/30/2016 Document Reviewed: 10/12/2015 Elsevier Interactive Patient Education  2017 [employer] Prevention in the Home Falls can cause injuries. They can happen to people of all ages. There are many things you can do to make your home safe and to help prevent falls. What can I do on the outside of my home? Regularly fix the edges of walkways and driveways and fix any cracks. Remove anything that might make you trip as you walk through a door, such as a raised step or threshold. Trim any bushes or trees on the path to your home. Use bright outdoor lighting. Clear any walking paths of anything that might make someone trip, such as rocks or tools. Regularly check to see if handrails are loose or broken. Make sure that both sides of any steps have handrails. Any raised decks and porches should have guardrails on the edges. Have any leaves, snow, or ice cleared regularly. Use sand or salt on walking paths during winter. Clean up any spills in your garage right away. This includes oil or grease spills. What can I do in the bathroom? Use night lights. Install grab bars by the toilet and in the tub and shower. Do not use towel bars as grab bars. Use non-skid mats or decals in the tub or shower. If you need to sit down in the shower, use a plastic, non-slip stool. Keep the floor dry. Clean up any water that spills on the floor as soon as it happens. Remove soap buildup in the tub or shower regularly. Attach bath mats securely with double-sided non-slip rug tape. Do not have throw rugs and other things on the floor that can make you trip. What can I do in the bedroom? Use night lights. Make sure that you have a light by your bed that is easy to reach. Do not use  any sheets or blankets that are too big for your bed. They should not hang down onto the floor. Have a firm chair that has side arms. You can use this for support while you get dressed. Do not have throw rugs and other things on the floor that can make you trip. What can I do in the kitchen? Clean up any spills right away. Avoid walking on wet floors. Keep items that you use a lot in easy-to-reach places. If you need to reach something above you, use a strong step stool that has a grab bar. Keep electrical cords out of the way. Do not use floor polish or wax that makes floors slippery. If you must use wax, use non-skid floor wax. Do not have throw rugs and other things on the floor that can make you trip. What can I do with my stairs? Do not leave any items on the stairs. Make sure that there are handrails on both sides of the stairs and use them. Fix handrails that are broken or loose. Make sure that handrails are as long as the stairways. Check any carpeting to make sure that it is firmly attached to the stairs. Fix any carpet that is loose or worn. Avoid having throw rugs at the top or bottom of the stairs. If you do have throw rugs, attach them to the  floor with carpet tape. Make sure that you have a light switch at the top of the stairs and the bottom of the stairs. If you do not have them, ask someone to add them for you. What else can I do to help prevent falls? Wear shoes that: Do not have high heels. Have rubber bottoms. Are comfortable and fit you well. Are closed at the toe. Do not wear sandals. If you use a stepladder: Make sure that it is fully opened. Do not climb a closed stepladder. Make sure that both sides of the stepladder are locked into place. Ask someone to hold it for you, if possible. Clearly mark and make sure that you can see: Any grab bars or handrails. First and last steps. Where the edge of each step is. Use tools that help you move around (mobility aids)  if they are needed. These include: Canes. Walkers. Scooters. Crutches. Turn on the lights when you go into a dark area. Replace any light bulbs as soon as they burn out. Set up your furniture so you have a clear path. Avoid moving your furniture around. If any of your floors are uneven, fix them. If there are any pets around you, be aware of where they are. Review your medicines with your doctor. Some medicines can make you feel dizzy. This can increase your chance of falling. Ask your doctor what other things that you can do to help prevent falls. This information is not intended to replace advice given to you by your health care provider. Make sure you discuss any questions you have with your health care provider. Document Released: 10/07/2009 Document Revised: 05/18/2016 Document Reviewed: 01/15/2015 Elsevier Interactive Patient Education  2017 [employer].

## 2023-02-06 NOTE — Progress Notes (Signed)
   This service is provided via telemedicine  No vital signs collected/recorded due to the encounter was a telemedicine visit.   Location of patient (ex: home, work):  Home  Patient consents to a telephone visit: Yes, see telephone visit dated 02/06/23  Location of the provider (ex: office, home):  [place], [place]   Name of any referring provider:  N/A  Names of all persons participating in the telemedicine service and their role in the encounter:  S.Chrae B/CMA, [name], NP, and Patient   Time spent on call:  11 min with medical assistant

## 2023-02-06 NOTE — Progress Notes (Signed)
Subjective:   [name] is a 89 y.o. female who presents for Medicare Annual (Subsequent) preventive examination.  Review of Systems     Cardiac Risk Factors include: advanced age (>13mn, >>70women);family history of premature cardiovascular disease;hypertension;obesity (BMI >30kg/m2)     Objective:    There were no vitals filed for this visit. There is no height or weight on file to calculate BMI.     02/06/2023    9:28 AM 12/11/2022   11:25 AM 12/04/2022    8:39 AM 07/17/2022   11:13 AM 01/31/2022   10:39 AM 10/07/2021    9:14 AM 07/12/2021    9:04 AM  Advanced Directives  Does Patient Have a Medical Advance Directive? Yes Yes Yes Yes Yes Yes Yes  Type of Advance Directive Out of facility DNR (pink MOST or yellow form);Healthcare Power of [place] of [place] D[place] (pink MOST or yellow form) Living will HBelleplainOut of facility DNR (pink MOST or yellow form) H[place] of facility DNR (pink MOST or yellow form) HLancasterOut of facility DNR (pink MOST or yellow form) HElizabethLiving will;Out of facility DNR (pink MOST or yellow form)  Does patient want to make changes to medical advance directive? No - Patient declined   No - Patient declined No - Patient declined No - Patient declined No - Patient declined  Copy of [place] Chart? Yes - validated most recent copy scanned in chart (See row information) Yes - validated most recent copy scanned in chart (See row information)  Yes - validated most recent copy scanned in chart (See row information) Yes - validated most recent copy scanned in chart (See row information) Yes - validated most recent copy scanned in chart (See row information) Yes - validated most recent copy scanned in chart (See row information)  Pre-existing out of facility DNR order (yellow form or pink MOST form) Yellow form placed in  chart (order not valid for inpatient use);Pink MOST form placed in chart (order not valid for inpatient use) Yellow form placed in chart (order not valid for inpatient use);Pink MOST form placed in chart (order not valid for inpatient use)  Pink MOST form placed in chart (order not valid for inpatient use);Yellow form placed in chart (order not valid for inpatient use) Yellow form placed in chart (order not valid for inpatient use);Pink MOST form placed in chart (order not valid for inpatient use) Yellow form placed in chart (order not valid for inpatient use);Pink MOST form placed in chart (order not valid for inpatient use)     Current Medications (verified) Outpatient Encounter Medications as of 02/06/2023  Medication Sig   acetaminophen (TYLENOL) 500 MG tablet Take 500 mg by mouth every 6 (six) hours as needed for moderate pain.   antiseptic oral rinse (BIOTENE) LIQD 15 mLs by Mouth Rinse route 2 (two) times daily.   Artificial Saliva (ACT DRY MOUTH) LOZG Use as directed 1 drop in the mouth or throat as needed (dry mouth).   bimatoprost (LUMIGAN) 0.01 % SOLN Place 1 drop into both eyes at bedtime.    Calcium Citrate-Vitamin D (CALCIUM + D PO) Take 1 tablet by mouth daily.   Cholecalciferol (VITAMIN D) 125 MCG (5000 UT) CAPS Take 5,000 Units by mouth daily.   Docusate Calcium (STOOL SOFTENER PO) Take 1 capsule by mouth as needed.   dorzolamide (TRUSOPT) 2 % ophthalmic solution Place 1 drop  into both eyes 3 (three) times daily.   ELIQUIS 5 MG TABS tablet TAKE 1 TABLET BY MOUTH TWICE A DAY   esomeprazole (NEXIUM) 20 MG capsule TAKE 1 CAPSULE (20 MG TOTAL) BY MOUTH AS NEEDED.   ezetimibe (ZETIA) 10 MG tablet TAKE ONE TABLET BY MOUTH ONCE DAILY   Glycerin, Laxative, (ADULT SUPPOSITORY RE) Place 1 suppository rectally as needed.   methylcellulose (ARTIFICIAL TEARS) 1 % ophthalmic solution Place 1 drop into both eyes daily as needed (for dry eyes).    metoprolol tartrate (LOPRESSOR) 25 MG tablet TAKE  1 TABLET BY MOUTH TWICE A DAY   OVER THE COUNTER MEDICATION Take 1 capsule by mouth 2 (two) times daily. HydroEye supplement   propafenone (RYTHMOL SR) 225 MG 12 hr capsule TAKE 1 CAPSULE BY MOUTH TWICE A DAY   traZODone (DESYREL) 50 MG tablet TAKE 1 TABLET BY MOUTH EVERYDAY AT BEDTIME   trolamine salicylate (ASPERCREME) 10 % cream Apply 1 application topically as needed for muscle pain (for knee pain).   valsartan-hydrochlorothiazide (DIOVAN-HCT) 320-25 MG tablet TAKE ONE TABLET BY MOUTH DAILY   [DISCONTINUED] alendronate (FOSAMAX) 70 MG tablet Take 1 tablet (70 mg total) by mouth every 7 (seven) days. Take with a full glass of water on an empty stomach.   No facility-administered encounter medications on file as of 02/06/2023.    Allergies (verified) Advil [ibuprofen], Aleve [naproxen sodium], Aspirin, Atorvastatin, Celebrex [celecoxib], Diclofenac, Doxycycline, Nsaids, Other, Pravastatin, Timolol, and Metoclopramide hcl   History: Past Medical History:  Diagnosis Date   Allergic rhinitis due to pollen    Anxiety state, unspecified    Sitautional- pain   Arthritis    "right knee" (06/21/2016)   Asymptomatic varicose veins    Carpal tunnel syndrome    Chest pain, unspecified    Chronic lower back pain    "on the left side" (06/21/2016)   Constipation    Cramp of limb    Depressive disorder, not elsewhere classified    Diaphragmatic hernia without mention of obstruction or gangrene    Diaphragmatic hernia without mention of obstruction or gangrene    Disturbance of skin sensation    Diverticulosis of colon (without mention of hemorrhage)    Dysrhythmia    Atrial Flutter   Enthesopathy of hip region    Esophageal reflux    , just occasional   History of blood transfusion    "w/both knee replacements"   History of duodenal ulcer    History of kidney stones    passed   Insomnia, unspecified    Lumbago    Migraine    "none since ~ 1990" (06/21/2016)   Myalgia and myositis,  unspecified    Obesity, unspecified    Other abnormal blood chemistry    Other dysphagia    Other malaise and fatigue    Other nonspecific abnormal serum enzyme levels    Other specified cardiac dysrhythmias(427.89)    Pacemaker    Pain in joint, ankle and foot    Pain in joint, hand    Pain in joint, lower leg    Pain in joint, pelvic region and thigh    Pain in limb    Plantar fascial fibromatosis    Presence of permanent cardiac pacemaker    -St Jude   Reflux esophagitis    Sciatica    Spinal stenosis, unspecified region other than cervical    Stricture and stenosis of esophagus    Symptomatic menopausal or female climacteric states  Unspecified essential hypertension    Unspecified essential hypertension    Unspecified vitamin D deficiency    Past Surgical History:  Procedure Laterality Date   BREAST BIOPSY Left 1990s X 2   CARDIOVERSION N/A 04/26/2016   Procedure: CARDIOVERSION;  Surgeon: Pixie Casino, MD;  Location: [facility] ENDOSCOPY;  Service: Cardiovascular;  Laterality: N/A;   CARDIOVERSION N/A 12/04/2022   Procedure: CARDIOVERSION;  Surgeon: [name], MD;  Location: [place];  Service: Cardiovascular;  Laterality: N/A;   CATARACT EXTRACTION W/ INTRAOCULAR LENS IMPLANT Left 08/03/1999   DR EPES    CATARACT EXTRACTION W/ INTRAOCULAR LENS IMPLANT Right 2000   DR EPES   CHOLECYSTECTOMY OPEN  1989   DR BOWMAN   COLONOSCOPY  1988   Normal    DENTAL SURGERY Left 08/2016   EP IMPLANTABLE DEVICE N/A 06/21/2016   Procedure: Pacemaker Implant;  Surgeon: Evans Lance, MD;  Location: [place] CV LAB;  Service: Cardiovascular;  Laterality: N/A;   ESOPHAGOGASTRODUODENOSCOPY (EGD) WITH ESOPHAGEAL DILATION  ~ 1982   Dr. Sharlett Iles   EXCISION OF ACTINIC KERATOSIS     DR LUPTON    EYE SURGERY     INSERT / [place] / REMOVE PACEMAKER     JOINT REPLACEMENT     KNEE ARTHROSCOPY Left 2003   KNEE ARTHROSCOPY Right 06/26/2013   KNEE CLOSED REDUCTION Right  10/23/2013   Procedure: CLOSED MANIPULATION RIGHT KNEE;  Surgeon: Mcarthur Rossetti, MD;  Location: [place];  Service: Orthopedics;  Laterality: Right;   LASER FOR CLOUDY CAP LEFT EYE Left 03/2006   DR DIGBY   LUMBAR LAMINECTOMY/DECOMPRESSION MICRODISCECTOMY N/A 06/12/2019   Procedure: Laminectomy and Foraminotomy - Lumbar four-Lumbar five;  Surgeon: Eustace Moore, MD;  Location: [place];  Service: Neurosurgery;  Laterality: N/A;   [facility], Dr.Gray. Removed area on upper chest   SKIN SURGERY     May 2023, upper left chest area, perfored by the skin surgery center   TOTAL KNEE ARTHROPLASTY Left 04/2004   DR RENDALL   TOTAL KNEE ARTHROPLASTY Right 07/04/2013   Procedure: RIGHT TOTAL KNEE ARTHROPLASTY;  Surgeon: Mcarthur Rossetti, MD;  Location: [facility];  Service: Orthopedics;  Laterality: Right;   TRIGGER FINGER RELEASE Right 09/2018   VAGINAL HYSTERECTOMY  1979   Family History  Problem Relation Age of Onset   Ovarian cancer Mother    Uterine cancer Mother    Cerebrovascular Accident Mother    Heart disease Father    Hypertension Brother    Obesity Daughter    Social History   Socioeconomic History   Marital status: Married    Spouse name: Not on file   Number of children: Not on file   Years of education: Not on file   Highest education level: Not on file  Occupational History   Not on file  Tobacco Use   Smoking status: Never   Smokeless tobacco: Never  Vaping Use   Vaping Use: Never used  Substance and Sexual Activity   Alcohol use: No   Drug use: No   Sexual activity: Yes  Other Topics Concern   Not on file  Social History Narrative   Not on file   Social Determinants of Health   Financial Resource Strain: Low Risk  (11/21/2017)   Overall Financial Resource Strain (CARDIA)    Difficulty of Paying Living Expenses: Not hard at all  Food Insecurity: No Food Insecurity (11/21/2017)   Hunger Vital Sign  Worried About Paediatric nurse in the Last Year: Never true    [place] in the Last Year: Never true  Transportation Needs: No Transportation Needs (11/21/2017)   PRAPARE - Hydrologist (Medical): No    Lack of Transportation (Non-Medical): No  Physical Activity: Sufficiently Active (11/21/2017)   Exercise Vital Sign    Days of Exercise per Week: 5 days    Minutes of Exercise per Session: 30 min  Stress: Stress Concern Present (11/21/2017)   [place]    Feeling of Stress : To some extent  Social Connections: Socially Integrated (11/21/2017)   Social Connection and Isolation Panel [NHANES]    Frequency of Communication with Friends and Family: Once a week    Frequency of Social Gatherings with Friends and Family: More than three times a week    Attends Religious Services: 1 to 4 times per year    Active Member of [employer] or Organizations: Yes    Attends Archivist Meetings: 1 to 4 times per year    Marital Status: Married    Tobacco Counseling Counseling given: Not Answered   Clinical Intake:  Pre-visit preparation completed: Yes  Pain : 0-10 Pain Type: Acute pain Pain Location: Hip Pain Orientation: Right     BMI - recorded: 34 Nutritional Status: BMI > 30  Obese Diabetes: No  How often do you need to have someone help you when you read instructions, pamphlets, or other written materials from your doctor or pharmacy?: 1 - Never  Diabetic?no         Activities of Daily Living    02/06/2023   11:27 AM  In your present state of health, do you have any difficulty performing the following activities:  Hearing? 0  Vision? 0  Difficulty concentrating or making decisions? 0  Walking or climbing stairs? 0  Dressing or bathing? 0  Doing errands, shopping? 0  Preparing Food and eating ? N  Using the Toilet? N  In the past six months, have you accidently leaked urine? Y  Do  you have problems with loss of bowel control? N  Managing your Medications? N  Managing your Finances? N  Housekeeping or managing your Housekeeping? N    Patient Care Team: Lauree Chandler, NP as PCP - General (Geriatric Medicine) Martinique, Peter M, MD as PCP - Cardiology (Cardiology) Mcarthur Rossetti, MD as Attending Physician (Orthopedic Surgery) Larey Dresser, MD as Attending Physician (Cardiology) Thornell Sartorius, MD as Consulting Physician (Otolaryngology) Druscilla Brownie, MD as Consulting Physician (Dermatology) Debara Pickett Nadean Corwin, MD as Consulting Physician (Cardiology) Eustace Moore, MD as Consulting Physician (Neurosurgery) Lonia Skinner, MD as Consulting Physician (Ophthalmology)  Indicate any recent Medical Services you may have received from other than [facility] providers in the past year (date may be approximate).     Assessment:   This is a routine wellness examination for [place].  Hearing/Vision screen Hearing Screening - Comments:: No hearing issues Vision Screening - Comments:: Last eye exam less than 12 months ago with Dr. Eulas Post   Dietary issues and exercise activities discussed: Current Exercise Habits: Home exercise routine, Type of exercise: walking, Time (Minutes): 15, Frequency (Times/Week): 5, Weekly Exercise (Minutes/Week): 75   Goals Addressed   None    Depression Screen    02/06/2023   10:32 AM 12/11/2022    1:48 PM 01/31/2022   10:35 AM 01/24/2021  10:01 AM 10/01/2020    2:20 PM 01/12/2020    9:18 AM 01/06/2019    2:19 PM  PHQ 2/9 Scores  PHQ - 2 Score 0 0 0 0 0 0 0    Fall Risk    02/06/2023   10:32 AM 12/11/2022    1:47 PM 07/17/2022    1:55 PM 01/31/2022   10:36 AM 10/07/2021   11:33 AM  Fall Risk   Falls in the past year? 0 1 1 1 1  $ Number falls in past yr: 0 1 1 1 $ 0  Injury with Fall? 0 0 0 1 1  Risk for fall due to : No Fall Risks History of fall(s) History of fall(s) History of fall(s)   Follow up Falls evaluation  completed Falls evaluation completed Falls evaluation completed Falls evaluation completed Falls evaluation completed    [place]:  Any stairs in or around the home? Yes  If so, are there any without handrails? No  Home free of loose throw rugs in walkways, pet beds, electrical cords, etc? Yes  Adequate lighting in your home to reduce risk of falls? Yes   ASSISTIVE DEVICES UTILIZED TO PREVENT FALLS:  Life alert? No  Use of a cane, walker or w/c? No  Grab bars in the bathroom? Yes  Shower chair or bench in shower? No  Elevated toilet seat or a handicapped toilet? Yes   TIMED UP AND GO:  Was the test performed? No .    Cognitive Function:    01/06/2019    2:23 PM 11/21/2017   10:13 AM 12/12/2016    2:24 PM 11/30/2015    3:06 PM  MMSE - Mini Mental State Exam  Orientation to time 5 5 5 5  $ Orientation to Place 5 5 5 5  $ Registration 3 3 3 3  $ Attention/ Calculation 5 5 5 5  $ Recall 3 3 3 3  $ Language- name 2 objects 2 2 2 2  $ Language- repeat 1 1 1 1  $ Language- follow 3 step command 3 3 3 3  $ Language- read & follow direction 1 1 1 1  $ Write a sentence 1 1 1 1  $ Copy design 1 1 1 1  $ Total score 30 30 30 30        $ 02/06/2023   10:35 AM 01/31/2022   10:39 AM 01/24/2021   10:03 AM 01/12/2020    9:18 AM  6CIT Screen  What Year? 0 points 0 points 0 points 0 points  What month? 0 points 0 points 0 points 0 points  What time? 0 points 0 points 0 points 0 points  Count back from 20 0 points 0 points 0 points 0 points  Months in reverse 0 points 0 points 0 points 0 points  Repeat phrase 0 points 0 points 0 points 0 points  Total Score 0 points 0 points 0 points 0 points    Immunizations Immunization History  Administered Date(s) Administered   Fluad Quad(high Dose 65+) 09/29/2019, 10/01/2020, 10/07/2021, 11/08/2022   Influenza, High Dose Seasonal PF 09/14/2017, 10/02/2018   Influenza,inj,Quad PF,6+ Mos 09/23/2013, 09/25/2014, 10/11/2015,  08/30/2016   PFIZER(Purple Top)SARS-COV-2 Vaccination 01/16/2020, 02/06/2020, 09/29/2020   Pneumococcal Conjugate-13 02/17/2015   Pneumococcal Polysaccharide-23 12/12/2016   Tdap 06/24/2017   Zoster Recombinat (Shingrix) 09/04/2017, 02/21/2018   Zoster, Live 01/29/2008    TDAP status: Up to date  Flu Vaccine status: Up to date  Pneumococcal vaccine status: Up to date  Covid-19 vaccine status: Information  provided on how to obtain vaccines.   Qualifies for Shingles Vaccine? Yes   Zostavax completed No   Shingrix Completed?: Yes  Screening Tests Health Maintenance  Topic Date Due   COVID-19 Vaccine (4 - 2023-24 season) 02/22/2023 (Originally 08/25/2022)   MAMMOGRAM  10/18/2023   Medicare Annual Wellness (AWV)  02/07/2024   DTaP/Tdap/Td (2 - Td or Tdap) 06/25/2027   Pneumonia Vaccine 75+ Years old  Completed   INFLUENZA VACCINE  Completed   DEXA SCAN  Completed   Zoster Vaccines- Shingrix  Completed   HPV VACCINES  Aged Out    Health Maintenance  There are no preventive care reminders to display for this patient.   Colorectal cancer screening: No longer required.   Mammogram status: No longer required due to AGE.  Bone Density status: Ordered 06/20/21. Pt provided with contact info and advised to call to schedule appt.  Lung Cancer Screening: (Low Dose CT Chest recommended if Age 46-80 years, 30 pack-year currently smoking OR have quit w/in 15years.) does not qualify.   Lung Cancer Screening Referral: na  Additional Screening:  Hepatitis C Screening: does not qualify; Completed   Vision Screening: Recommended annual ophthalmology exams for early detection of glaucoma and other disorders of the eye. Is the patient up to date with their annual eye exam?  Yes  Who is the provider or what is the name of the office in which the patient attends annual eye exams? [facility] If pt is not established with a provider, would they like to be referred to a provider to establish care?  No .   Dental Screening: Recommended annual dental exams for proper oral hygiene  Community Resource Referral / Chronic Care Management: CRR required this visit?  No   CCM required this visit?  No      Plan:     I have personally reviewed and noted the following in the patient's chart:   Medical and social history Use of alcohol, tobacco or illicit drugs  Current medications and supplements including opioid prescriptions. Patient is not currently taking opioid prescriptions. Functional ability and status Nutritional status Physical activity Advanced directives List of other physicians Hospitalizations, surgeries, and ER visits in previous 12 months Vitals Screenings to include cognitive, depression, and falls Referrals and appointments  In addition, I have reviewed and discussed with patient certain preventive protocols, quality metrics, and best practice recommendations. A written personalized care plan for preventive services as well as general preventive health recommendations were provided to patient.     Lauree Chandler, NP   02/06/2023   Virtual Visit via Video Note  I connected with Larence Penning on 02/06/23 at 10:40 AM EST by a video enabled telemedicine application and verified that I am speaking with the correct person using two identifiers.  Location: Patient: home Provider: [place]   I discussed the limitations of evaluation and management by telemedicine and the availability of in person appointments. The patient expressed understanding and agreed to proceed.    I discussed the assessment and treatment plan with the patient. The patient was provided an opportunity to ask questions and all were answered. The patient agreed with the plan and demonstrated an understanding of the instructions.   The patient was advised to call back or seek an in-person evaluation if the symptoms worsen or if the condition fails to improve as anticipated.  I provided 15  minutes of non-face-to-face time during this encounter.  Carlos American. Dewaine Oats, AGNP Avs printed and mailed.

## 2023-02-06 NOTE — Progress Notes (Signed)
Office Visit Note   Patient: [name]           Date of Birth: 06-27-1931           MRN: [id number] Visit Date: 02/06/2023              Requested by: Veronica Chandler, NP [place],  [place] [address] PCP: Veronica Chandler, NP  Chief Complaint  Patient presents with   Right Hip - Pain      HPI: [name] is a pleasant 89 year old woman who comes in today for right posterior buttock pain.  She has a history of trochanteric bursitis on the right.  She denies any injury it just started bothering her especially with trying to bring her leg up.  She has used some over-the-counter medication and Tylenol and this helped a little bit.  Assessment & Plan: Visit Diagnoses: Hamstring tendinitis  Plan: Most of her pain pain is to be in the insertional area of the hamstring below the buttock.  Does not really have much pain over the trochanteric bursa.  We will try a Medrol Dosepak she has taken this and tolerated it fine in the past.  She understands to take it with food if she has any concerns she may contact me  Follow-Up Instructions: Return if symptoms worsen or fail to improve.   Ortho Exam  Patient is alert, oriented, no adenopathy, well-dressed, normal affect, normal respiratory effort. Examination she has no tenderness with manipulation of her hip.  With flexion and extension of her leg she has pain in the proximal insertion of the hamstring.  No tenderness over the trochanteric bursa.  She is neurovascularly intact good strength  Imaging: No results found. No images are attached to the encounter.  Labs: Lab Results  Component Value Date   HGBA1C 5.2 08/06/2017   HGBA1C 5.6 03/27/2016   HGBA1C 5.8 (H) 11/26/2015     Lab Results  Component Value Date   ALBUMIN 3.6 08/06/2017   ALBUMIN 3.6 03/27/2016   ALBUMIN 3.7 11/26/2015    No results found for: "MG" No results found for: "VD25OH"  No results found for: "PREALBUMIN"    Latest Ref Rng &  Units 11/27/2022   12:00 PM 07/17/2022    2:36 PM 10/07/2021    1:47 PM  CBC EXTENDED  WBC 3.4 - 10.8 x10E3/uL 5.0  6.0  4.9   RBC 3.77 - 5.28 x10E6/uL 3.93  4.20  4.03   Hemoglobin 11.1 - 15.9 g/dL 12.1  13.0  12.3   HCT 34.0 - 46.6 % 37.0  38.7  37.4   Platelets 150 - 450 x10E3/uL 184  190  160   NEUT# 1.4 - 7.0 x10E3/uL 2.9  3,294  2,377   Lymph# 0.7 - 3.1 x10E3/uL 1.6  2,052  1,936      There is no height or weight on file to calculate BMI.  Orders:  No orders of the defined types were placed in this encounter.  Meds ordered this encounter  Medications   methylPREDNISolone (MEDROL DOSEPAK) 4 MG TBPK tablet    Sig: Take as directed with food    Dispense:  21 tablet    Refill:  0     Procedures: No procedures performed  Clinical Data: No additional findings.  ROS:  All other systems negative, except as noted in the HPI. Review of Systems  Objective: Vital Signs: There were no vitals taken for this visit.  Specialty Comments:  No specialty comments available.  PMFS History: Patient Active Problem List   Diagnosis Date Noted   Sinus node dysfunction ([facility]) 10/29/2019   S/P lumbar laminectomy 06/12/2019   Osteoporosis 01/28/2019   Trochanteric bursitis, right hip 05/16/2017   Cardiac pacemaker in situ 06/21/2016   Atypical atrial flutter ([facility])    Chest pain 04/10/2016   Atrial fibrillation ([facility]) 03/29/2016   Hyperglycemia 02/17/2015   Obese 02/17/2015   Insomnia 01/27/2014   Knee ankylosis, right knee 10/23/2013   Knee joint replacement by other means 10/01/2013   Arthritis of knee, right 07/04/2013   Osteoarthritis of right knee 07/01/2013   Bradycardia 05/13/2013   HYPERCHOLESTEROLEMIA 06/02/2009   Essential hypertension 06/02/2009   ESOPHAGEAL STRICTURE 06/02/2009   GERD 06/02/2009   HIATAL HERNIA 06/02/2009   DIVERTICULOSIS, COLON 06/02/2009   Dysphagia, pharyngoesophageal phase 06/02/2009   Backache 05/01/2007   LEG PAIN, RIGHT 05/01/2007    Past Medical History:  Diagnosis Date   Allergic rhinitis due to pollen    Anxiety state, unspecified    Sitautional- pain   Arthritis    "right knee" (06/21/2016)   Asymptomatic varicose veins    Carpal tunnel syndrome    Chest pain, unspecified    Chronic lower back pain    "on the left side" (06/21/2016)   Constipation    Cramp of limb    Depressive disorder, not elsewhere classified    Diaphragmatic hernia without mention of obstruction or gangrene    Diaphragmatic hernia without mention of obstruction or gangrene    Disturbance of skin sensation    Diverticulosis of colon (without mention of hemorrhage)    Dysrhythmia    Atrial Flutter   Enthesopathy of hip region    Esophageal reflux    , just occasional   History of blood transfusion    "w/both knee replacements"   History of duodenal ulcer    History of kidney stones    passed   Insomnia, unspecified    Lumbago    Migraine    "none since ~ 1990" (06/21/2016)   Myalgia and myositis, unspecified    Obesity, unspecified    Other abnormal blood chemistry    Other dysphagia    Other malaise and fatigue    Other nonspecific abnormal serum enzyme levels    Other specified cardiac dysrhythmias(427.89)    Pacemaker    Pain in joint, ankle and foot    Pain in joint, hand    Pain in joint, lower leg    Pain in joint, pelvic region and thigh    Pain in limb    Plantar fascial fibromatosis    Presence of permanent cardiac pacemaker    -St Jude   Reflux esophagitis    Sciatica    Spinal stenosis, unspecified region other than cervical    Stricture and stenosis of esophagus    Symptomatic menopausal or female climacteric states    Unspecified essential hypertension    Unspecified essential hypertension    Unspecified vitamin D deficiency     Family History  Problem Relation Age of Onset   Ovarian cancer Mother    Uterine cancer Mother    Cerebrovascular Accident Mother    Heart disease Father    Hypertension  Brother    Obesity Daughter     Past Surgical History:  Procedure Laterality Date   BREAST BIOPSY Left 1990s X 2   CARDIOVERSION N/A 04/26/2016   Procedure: CARDIOVERSION;  Surgeon: Pixie Casino, MD;  Location: [facility]  ENDOSCOPY;  Service: Cardiovascular;  Laterality: N/A;   CARDIOVERSION N/A 12/04/2022   Procedure: CARDIOVERSION;  Surgeon: [name], MD;  Location: [facility];  Service: Cardiovascular;  Laterality: N/A;   CATARACT EXTRACTION W/ INTRAOCULAR LENS IMPLANT Left 08/03/1999   DR EPES    CATARACT EXTRACTION W/ INTRAOCULAR LENS IMPLANT Right 2000   DR EPES   CHOLECYSTECTOMY OPEN  1989   DR BOWMAN   COLONOSCOPY  1988   Normal    DENTAL SURGERY Left 08/2016   EP IMPLANTABLE DEVICE N/A 06/21/2016   Procedure: Pacemaker Implant;  Surgeon: Evans Lance, MD;  Location: [facility];  Service: Cardiovascular;  Laterality: N/A;   ESOPHAGOGASTRODUODENOSCOPY (EGD) WITH ESOPHAGEAL DILATION  ~ 1982   Dr. Sharlett Iles   EXCISION OF ACTINIC KERATOSIS     DR LUPTON    EYE SURGERY     INSERT / [place] / REMOVE PACEMAKER     JOINT REPLACEMENT     KNEE ARTHROSCOPY Left 2003   KNEE ARTHROSCOPY Right 06/26/2013   KNEE CLOSED REDUCTION Right 10/23/2013   Procedure: CLOSED MANIPULATION RIGHT KNEE;  Surgeon: Mcarthur Rossetti, MD;  Location: [facility];  Service: Orthopedics;  Laterality: Right;   LASER FOR CLOUDY CAP LEFT EYE Left 03/2006   DR DIGBY   LUMBAR LAMINECTOMY/DECOMPRESSION MICRODISCECTOMY N/A 06/12/2019   Procedure: Laminectomy and Foraminotomy - Lumbar four-Lumbar five;  Surgeon: Eustace Moore, MD;  Location: [facility];  Service: Neurosurgery;  Laterality: N/A;   [facility], Dr.Gray. Removed area on upper chest   SKIN SURGERY     May 2023, upper left chest area, perfored by the skin surgery center   TOTAL KNEE ARTHROPLASTY Left 04/2004   DR RENDALL   TOTAL KNEE ARTHROPLASTY Right 07/04/2013   Procedure: RIGHT TOTAL KNEE ARTHROPLASTY;   Surgeon: Mcarthur Rossetti, MD;  Location: [facility];  Service: Orthopedics;  Laterality: Right;   TRIGGER FINGER RELEASE Right 09/2018   VAGINAL HYSTERECTOMY  1979   Social History   Occupational History   Not on file  Tobacco Use   Smoking status: Never   Smokeless tobacco: Never  Vaping Use   Vaping Use: Never used  Substance and Sexual Activity   Alcohol use: No   Drug use: No   Sexual activity: Yes

## 2023-02-08 DIAGNOSIS — Z08 Encounter for follow-up examination after completed treatment for malignant neoplasm: Secondary | ICD-10-CM | POA: Diagnosis not present

## 2023-02-08 DIAGNOSIS — L578 Other skin changes due to chronic exposure to nonionizing radiation: Secondary | ICD-10-CM | POA: Diagnosis not present

## 2023-02-08 DIAGNOSIS — Z85828 Personal history of other malignant neoplasm of skin: Secondary | ICD-10-CM | POA: Diagnosis not present

## 2023-02-16 ENCOUNTER — Telehealth: Payer: Self-pay

## 2023-02-16 MED ORDER — PREDNISONE 10 MG PO TABS: 10.0000 mg | ORAL_TABLET | Freq: Every day | ORAL | 0 refills | Status: DC

## 2023-02-16 NOTE — Telephone Encounter (Signed)
I called pt and advised that 10 mg Prednisone had been called to pharmacy. This pt is is laminectomy and foraminotomy L4 -L5 06/12/2019 and had a CT scan 10/23/22 of the L spine  (Erin had suggested MRI but the pt has a pacemaker) I told her that I would forward this message to you to see if you thought a referral to [facility] ( who she has seen before in the past) could be helpful per Erin's suggestion and that someone would call her upon your return once you have reviewed the message from today. She states that she has radiating pain down BLE with walking but sitting is ok.

## 2023-02-16 NOTE — Addendum Note (Signed)
Addended by: Suzan Slick on: 02/16/2023 11:33 AM   Modules accepted: Orders

## 2023-02-16 NOTE — Telephone Encounter (Signed)
Pt's husband called and states that for the past 2 days the pt has been in terrible pain. She was in the office 02/06/2023 hamstring pain and was given a medrol dose pak and states that this worked well for her. She went to a treat and did a lot of walking and states  this has flared up and she is out of the  steroid and wonders if she could possibly take again or perhaps to take a daily prednisone with a lower lose dose. Cb [phone] [facility] [address]

## 2023-02-16 NOTE — Telephone Encounter (Signed)
Should consider mri lumbar v referaal to [facility]  Pred sent

## 2023-02-18 ENCOUNTER — Other Ambulatory Visit: Payer: Self-pay | Admitting: Internal Medicine

## 2023-02-18 NOTE — Progress Notes (Unsigned)
Cardiology Office Note Date:  02/20/2023  Patient ID:  [name], [name] 04/11/1931, MRN [id number] PCP:  Lauree Chandler, NP  Cardiologist:  Dr. Martinique Electrophysiologist: Dr. Lovena Le    Chief Complaint: 3 mo  History of Present Illness: [name] is a 89 y.o. female with history of SND w/PPM, AFib/atypical AFlutter, spinal stenosis/sciatica  She saw Dr. Lovena Le 11/10/22, she had recent back surgery with improvement in the weakness in her legs, recent increase in fatigue that corresponded to onset of AFib in the recent weeks. Discussed management strategies for rhythm control, felt to be minimally symptomatic, planned watchful waiting with consideration of dofetilide or amiodarone as next AAD options. AP 99% Advised her to call should she want to purse change.  She did eventually reports some increased SOB to Dr. Martinique and underwent DCCV 12/04/22. She saw Dr. Martinique again 12/28/22 maintaining SR and feeling markedly improved. No changes were made  TODAY She is accompanied by her husband.  She is mostly bothered by her RLE pain/sciatica with back trouble.  She is pending next wee an injection for her back, she is currently on prednisone for it. She thinks she may have had some Afib, connects this to a sense of upset stomach, perhaps a sense that she has to take a deep breath.  She has some palpitations, these are usually momentary/brief. NO near syncope or suncope. No bleeding or sogns of bleeding No CP   Device information Abbott dual chamber PPM implanted 06/21/2016  AFib/AAD hx AFib diagnosed 2017 AAD propafenone started 2017  Past Medical History:  Diagnosis Date   Allergic rhinitis due to pollen    Anxiety state, unspecified    Sitautional- pain   Arthritis    "right knee" (06/21/2016)   Asymptomatic varicose veins    Carpal tunnel syndrome    Chest pain, unspecified    Chronic lower back pain    "on the left side" (06/21/2016)   Constipation    Cramp  of limb    Depressive disorder, not elsewhere classified    Diaphragmatic hernia without mention of obstruction or gangrene    Diaphragmatic hernia without mention of obstruction or gangrene    Disturbance of skin sensation    Diverticulosis of colon (without mention of hemorrhage)    Dysrhythmia    Atrial Flutter   Enthesopathy of hip region    Esophageal reflux    , just occasional   History of blood transfusion    "w/both knee replacements"   History of duodenal ulcer    History of kidney stones    passed   Insomnia, unspecified    Lumbago    Migraine    "none since ~ 1990" (06/21/2016)   Myalgia and myositis, unspecified    Obesity, unspecified    Other abnormal blood chemistry    Other dysphagia    Other malaise and fatigue    Other nonspecific abnormal serum enzyme levels    Other specified cardiac dysrhythmias(427.89)    Pacemaker    Pain in joint, ankle and foot    Pain in joint, hand    Pain in joint, lower leg    Pain in joint, pelvic region and thigh    Pain in limb    Plantar fascial fibromatosis    Presence of permanent cardiac pacemaker    -St Jude   Reflux esophagitis    Sciatica    Spinal stenosis, unspecified region other than cervical    Stricture and  stenosis of esophagus    Symptomatic menopausal or female climacteric states    Unspecified essential hypertension    Unspecified essential hypertension    Unspecified vitamin D deficiency     Past Surgical History:  Procedure Laterality Date   BREAST BIOPSY Left 1990s X 2   CARDIOVERSION N/A 04/26/2016   Procedure: CARDIOVERSION;  Surgeon: Pixie Casino, MD;  Location: [facility] ENDOSCOPY;  Service: Cardiovascular;  Laterality: N/A;   CARDIOVERSION N/A 12/04/2022   Procedure: CARDIOVERSION;  Surgeon: [name], MD;  Location: [facility] ENDOSCOPY;  Service: Cardiovascular;  Laterality: N/A;   CATARACT EXTRACTION W/ INTRAOCULAR LENS IMPLANT Left 08/03/1999   DR EPES    CATARACT EXTRACTION W/  INTRAOCULAR LENS IMPLANT Right 2000   DR EPES   CHOLECYSTECTOMY OPEN  1989   DR BOWMAN   COLONOSCOPY  1988   Normal    DENTAL SURGERY Left 08/2016   EP IMPLANTABLE DEVICE N/A 06/21/2016   Procedure: Pacemaker Implant;  Surgeon: Evans Lance, MD;  Location: [facility] CV [facility];  Service: Cardiovascular;  Laterality: N/A;   ESOPHAGOGASTRODUODENOSCOPY (EGD) WITH ESOPHAGEAL DILATION  ~ 1982   Dr. Sharlett Iles   EXCISION OF ACTINIC KERATOSIS     DR LUPTON    EYE SURGERY     INSERT / [place] / REMOVE PACEMAKER     JOINT REPLACEMENT     KNEE ARTHROSCOPY Left 2003   KNEE ARTHROSCOPY Right 06/26/2013   KNEE CLOSED REDUCTION Right 10/23/2013   Procedure: CLOSED MANIPULATION RIGHT KNEE;  Surgeon: Mcarthur Rossetti, MD;  Location: [facility];  Service: Orthopedics;  Laterality: Right;   LASER FOR CLOUDY CAP LEFT EYE Left 03/2006   DR DIGBY   LUMBAR LAMINECTOMY/DECOMPRESSION MICRODISCECTOMY N/A 06/12/2019   Procedure: Laminectomy and Foraminotomy - Lumbar four-Lumbar five;  Surgeon: Eustace Moore, MD;  Location: [facility];  Service: Neurosurgery;  Laterality: N/A;   [facility], Dr.Gray. Removed area on upper chest   SKIN SURGERY     May 2023, upper left chest area, perfored by the skin surgery center   TOTAL KNEE ARTHROPLASTY Left 04/2004   DR RENDALL   TOTAL KNEE ARTHROPLASTY Right 07/04/2013   Procedure: RIGHT TOTAL KNEE ARTHROPLASTY;  Surgeon: Mcarthur Rossetti, MD;  [facility];  Service: Orthopedics;  Laterality: Right;   TRIGGER FINGER RELEASE Right 09/2018   VAGINAL HYSTERECTOMY  1979    Current Outpatient Medications  Medication Sig Dispense Refill   acetaminophen (TYLENOL) 500 MG tablet Take 500 mg by mouth every 6 (six) hours as needed for moderate pain.     antiseptic oral rinse (BIOTENE) LIQD 15 mLs by Mouth Rinse route 2 (two) times daily.     Artificial Saliva (ACT DRY MOUTH) LOZG Use as directed 1 drop in the mouth or throat as needed (dry  mouth).     bimatoprost (LUMIGAN) 0.01 % SOLN Place 1 drop into both eyes at bedtime.      Calcium Citrate-Vitamin D (CALCIUM + D PO) Take 1 tablet by mouth daily.     Cholecalciferol (VITAMIN D) 125 MCG (5000 UT) CAPS Take 5,000 Units by mouth daily.     Docusate Calcium (STOOL SOFTENER PO) Take 1 capsule by mouth as needed.     dorzolamide (TRUSOPT) 2 % ophthalmic solution Place 1 drop into both eyes 3 (three) times daily.     ELIQUIS 5 MG TABS tablet TAKE 1 TABLET BY MOUTH TWICE A DAY 180 tablet 1  esomeprazole (NEXIUM) 20 MG capsule TAKE 1 CAPSULE (20 MG TOTAL) BY MOUTH AS NEEDED. 90 capsule 1   ezetimibe (ZETIA) 10 MG tablet TAKE ONE TABLET BY MOUTH ONCE DAILY 90 tablet 3   Glycerin, Laxative, (ADULT SUPPOSITORY RE) Place 1 suppository rectally as needed.     methylcellulose (ARTIFICIAL TEARS) 1 % ophthalmic solution Place 1 drop into both eyes daily as needed (for dry eyes).      methylPREDNISolone (MEDROL DOSEPAK) 4 MG TBPK tablet Take as directed with food 21 tablet 0   metoprolol tartrate (LOPRESSOR) 25 MG tablet TAKE 1 TABLET BY MOUTH TWICE A DAY 180 tablet 3   OVER THE COUNTER MEDICATION Take 1 capsule by mouth 2 (two) times daily. HydroEye supplement     predniSONE (DELTASONE) 10 MG tablet Take 1 tablet (10 mg total) by mouth daily with breakfast. 30 tablet 0   propafenone (RYTHMOL SR) 225 MG 12 hr capsule TAKE 1 CAPSULE BY MOUTH TWICE A DAY 180 capsule 2   traZODone (DESYREL) 50 MG tablet TAKE 1 TABLET BY MOUTH EVERYDAY AT BEDTIME 90 tablet 1   trolamine salicylate (ASPERCREME) 10 % cream Apply 1 application topically as needed for muscle pain (for knee pain).     valsartan-hydrochlorothiazide (DIOVAN-HCT) 320-25 MG tablet TAKE ONE TABLET BY MOUTH DAILY 90 tablet 1   No current facility-administered medications for this visit.    Allergies:   Advil [ibuprofen], Aleve [naproxen sodium], Aspirin, Atorvastatin, Celebrex [celecoxib], Diclofenac, Doxycycline, Nsaids, Other,  Pravastatin, Timolol, and Metoclopramide hcl   Social History:  The patient  reports that she has never smoked. She has never used smokeless tobacco. She reports that she does not drink alcohol and does not use drugs.   Family History:  The patient's family history includes Cerebrovascular Accident in her mother; Heart disease in her father; Hypertension in her brother; Obesity in her daughter; Ovarian cancer in her mother; Uterine cancer in her mother.  ROS:  Please see the history of present illness.    All other systems are reviewed and otherwise negative.   PHYSICAL EXAM:  VS:  BP (!) 146/82   Pulse 87   Ht '5\' 2"'$  (1.575 m)   Wt 180 lb 3.2 oz (81.7 kg)   SpO2 97%   BMI 32.96 kg/m  BMI: Body mass index is 32.96 kg/m. Well nourished, well developed, in no acute distress HEENT: normocephalic, atraumatic Neck: no JVD, carotid bruits or masses Cardiac:   RRR; no significant murmurs, no rubs, or gallops Lungs:   CTA b/l, no wheezing, rhonchi or rales Abd: soft, nontender MS: no deformity or atrophy Ext: trace edema Skin: warm and dry, no rash Neuro:  No gross deficits appreciated Psych: euthymic mood, full affect   PPM site is stable, no tethering or discomfort   EKG:  done today and reviwed by myself AP/VS 63bpm  Device interrogation done today and reviewed by myself:  Battery and lead measurements are good/stable Only 2 very short AF episodes since her DCCCV 10  and 14 seconds No HVR episodes   09/30/20: TTE 1. Left ventricular ejection fraction, by estimation, is 55 to 60%. The  left ventricle has normal function. The left ventricle has no regional  wall motion abnormalities. There is mild left ventricular hypertrophy.  Left ventricular diastolic parameters  are consistent with Grade I diastolic dysfunction (impaired relaxation).  The E/e' is 11.   2. Right ventricular systolic function is normal. The right ventricular  size is normal.   3. Left  atrial size was  moderately dilated.   4. The mitral valve is grossly normal. No evidence of mitral valve  regurgitation.   5. The aortic valve is tricuspid. There is mild calcification of the  aortic valve. Aortic valve regurgitation is not visualized.   6. The inferior vena cava is normal in size with greater than 50%  respiratory variability, suggesting right atrial pressure of 3 mmHg.   Comparison(s): No significant change from prior study.   Recent Labs: 07/17/2022: ALT 16; TSH 1.76 11/27/2022: BUN 19; Creatinine, Ser 0.80; Hemoglobin 12.1; Platelets 184; Potassium 4.1; Sodium 141  07/17/2022: Cholesterol 188; HDL 36; LDL Cholesterol (Calc) 106; Total CHOL/HDL Ratio 5.2; Triglycerides 347   CrCl cannot be calculated (Patient's most recent lab result is older than the maximum 21 days allowed.).   Wt Readings from Last 3 Encounters:  02/20/23 180 lb 3.2 oz (81.7 kg)  12/28/22 186 lb 9.6 oz (84.6 kg)  12/11/22 185 lb 12.8 oz (84.3 kg)     Other studies reviewed: Additional studies/records reviewed today include: summarized above  ASSESSMENT AND PLAN:  PPM Intact function No programming changes made  Persistent AFib Low since her DCCV as discussed abov Stable intervals on propafenone She had done very well this was her 1st development of AFib since started on propafenone that she can recall We will continue Discussed dose adjustment or alternative AAD if her burden increases.   Secondary hypercoagulable state i have advised that she reach out to her neurologist/neurosurgeon to remind them of her Eliquis, we would not object to a brief hold, 3 days typically   Disposition: F/u with Dr. Lovena Le as scheduled, sooner if needed  Current medicines are reviewed at length with the patient today.  The patient did not have any concerns regarding medicines.  Venetia Night, PA-C 02/20/2023 1:50 PM     [address] [place] [address] [phone]  (office)  [phone] (fax)

## 2023-02-19 ENCOUNTER — Other Ambulatory Visit: Payer: Self-pay | Admitting: Physician Assistant

## 2023-02-19 DIAGNOSIS — M5440 Lumbago with sciatica, unspecified side: Secondary | ICD-10-CM

## 2023-02-19 NOTE — Telephone Encounter (Signed)
Notified patient. She is scheduled to see her heart doctor tomorrow due to A-fib. She is hoping to get in with [facility] as soon as possible. She is having a very difficult time ambulating.

## 2023-02-20 ENCOUNTER — Telehealth: Payer: Self-pay | Admitting: Physical Medicine and Rehabilitation

## 2023-02-20 ENCOUNTER — Ambulatory Visit: Payer: Medicare HMO | Attending: Physician Assistant | Admitting: Physician Assistant

## 2023-02-20 ENCOUNTER — Encounter: Payer: Self-pay | Admitting: Physician Assistant

## 2023-02-20 VITALS — BP 146/82 | HR 87 | Ht 62.0 in | Wt 180.2 lb

## 2023-02-20 DIAGNOSIS — D6869 Other thrombophilia: Secondary | ICD-10-CM | POA: Diagnosis not present

## 2023-02-20 DIAGNOSIS — Z950 Presence of cardiac pacemaker: Secondary | ICD-10-CM | POA: Diagnosis not present

## 2023-02-20 DIAGNOSIS — I4819 Other persistent atrial fibrillation: Secondary | ICD-10-CM | POA: Diagnosis not present

## 2023-02-20 NOTE — Telephone Encounter (Signed)
Patient wants to sch appointment [id number]

## 2023-02-20 NOTE — Patient Instructions (Signed)
Medication Instructions:   Your physician recommends that you continue on your current medications as directed. Please refer to the Current Medication list given to you today.  *If you need a refill on your cardiac medications before your next appointment, please call your pharmacy*   Lab Work:  [place]    If you have labs (blood work) drawn today and your tests are completely normal, you will receive your results only by: [place] (if you have MyChart) OR A paper copy in the mail If you have any lab test that is abnormal or we need to change your treatment, we will call you to review the results.   Testing/Procedures:  NONE ORDERED  TODAY   Follow-Up: At [facility], you and your health needs are our priority.  As part of our continuing mission to provide you with exceptional heart care, we have created designated Provider Care Teams.  These Care Teams include your primary Cardiologist (physician) and Advanced Practice Providers (APPs -  Physician Assistants and Nurse Practitioners) who all work together to provide you with the care you need, when you need it.  We recommend signing up for the patient portal called "MyChart".  Sign up information is provided on this After Visit Summary.  MyChart is used to connect with patients for Virtual Visits (Telemedicine).  Patients are able to view lab/test results, encounter notes, upcoming appointments, etc.  Non-urgent messages can be sent to your provider as well.   To learn more about what you can do with MyChart, go to [email or web address].    Your next appointment:   AS SCHEDULED   Provider:   Cristopher Peru, MD   Other Instructions

## 2023-02-20 NOTE — Addendum Note (Signed)
Addended by: Rafael Bihari A on: 02/20/2023 12:24 PM   Modules accepted: Orders

## 2023-02-21 LAB — CUP PACEART INCLINIC DEVICE CHECK
Battery Remaining Longevity: 38 mo
Battery Voltage: 2.95 V
Brady Statistic RA Percent Paced: 79 %
Brady Statistic RV Percent Paced: 16 %
Date Time Interrogation Session: 20240227140900
Implantable Lead Connection Status: 753985
Implantable Lead Connection Status: 753985
Implantable Lead Implant Date: 20170628
Implantable Lead Implant Date: 20170628
Implantable Lead Location: 753859
Implantable Lead Location: 753860
Implantable Pulse Generator Implant Date: 20170628
Lead Channel Impedance Value: 487.5 Ohm
Lead Channel Impedance Value: 562.5 Ohm
Lead Channel Pacing Threshold Amplitude: 0.75 V
Lead Channel Pacing Threshold Amplitude: 0.75 V
Lead Channel Pacing Threshold Amplitude: 1.5 V
Lead Channel Pacing Threshold Amplitude: 1.5 V
Lead Channel Pacing Threshold Pulse Width: 0.5 ms
Lead Channel Pacing Threshold Pulse Width: 0.5 ms
Lead Channel Pacing Threshold Pulse Width: 0.5 ms
Lead Channel Pacing Threshold Pulse Width: 0.5 ms
Lead Channel Sensing Intrinsic Amplitude: 1.5 mV
Lead Channel Sensing Intrinsic Amplitude: 8.5 mV
Lead Channel Setting Pacing Amplitude: 2 V
Lead Channel Setting Pacing Amplitude: 3 V
Lead Channel Setting Pacing Pulse Width: 0.5 ms
Lead Channel Setting Sensing Sensitivity: 2 mV
Pulse Gen Model: 2272
Pulse Gen Serial Number: 7910313

## 2023-02-21 NOTE — Telephone Encounter (Signed)
Spoke with patient and scheduled OV for 02/22/23

## 2023-02-22 ENCOUNTER — Ambulatory Visit: Payer: Medicare HMO | Admitting: Physical Medicine and Rehabilitation

## 2023-02-22 DIAGNOSIS — M961 Postlaminectomy syndrome, not elsewhere classified: Secondary | ICD-10-CM

## 2023-02-22 DIAGNOSIS — M48062 Spinal stenosis, lumbar region with neurogenic claudication: Secondary | ICD-10-CM | POA: Diagnosis not present

## 2023-02-22 DIAGNOSIS — R269 Unspecified abnormalities of gait and mobility: Secondary | ICD-10-CM | POA: Diagnosis not present

## 2023-02-22 DIAGNOSIS — M5416 Radiculopathy, lumbar region: Secondary | ICD-10-CM

## 2023-02-22 NOTE — Progress Notes (Unsigned)
[name] - 89 y.o. female MRN [id number]  Date of birth: June 15, 1931  Office Visit Note: Visit Date: 02/22/2023 PCP: Veronica Chandler, NP Referred by: Veronica Chandler, NP  Subjective: Chief Complaint  Patient presents with   Lower Back - Pain   HPI: [name] is a 89 y.o. female who comes in today per the request of Veronica Palmer Persons, PA for evaluation of chronic, worsening and severe right sided buttock pain radiating around to right lateral hip and down lateral leg. [name] is a pleasant lady, her husband accompanying her during our visit today. Pain ongoing for several years, worsened after prolonged standing/walking several weeks ago. Patient concerned she injured her right hip while walking. She describes pain as a sharp sensation, also reports numbness to right thigh and knee, currently rates as 8 out of 10. States her pain feels identical to previous issues many years ago. Some relief of pain with rest and use of medications. Currently taking regimen of oral steroids. CT myelogram from October of 2023 exhibits prior decompression at the level of L5-S1 with resolved spinal canal stenosis. Mild to moderate spinal canal stenosis at L4-L5. New superiorly migrating right subarticular and foraminal disc protrusion at L4-L5 with impingement of the right L4 nerve root. No high grade spinal canal stenosis noted. History of multiple lumbar epidural steroid injections in our office, last injection in 2020. Injections did help greatly in the beginning, however over time did yield diminishing returns. Patient underwent L4-L5 laminectomy foraminotomy with [name] in 2020. She reports good relief of pain post surgery until recently. Patient wanted to be evaluated today to discuss treatment options, she is unsure if she wants to proceed with injection or discuss surgical intervention with [name]. She is currently ambulating with cane. Patient denies focal weakness. No recent trauma  or falls.     Review of Systems  Musculoskeletal:  Positive for back pain.  Neurological:  Negative for tingling, sensory change, focal weakness and weakness.  All other systems reviewed and are negative.  Otherwise per HPI.  Assessment & Plan: Visit Diagnoses:    ICD-10-CM   1. Lumbar radiculopathy  M54.16     2. Spinal stenosis of lumbar region with neurogenic claudication  M48.062     3. Post laminectomy syndrome  M96.1     4. Gait abnormality  R26.9        Plan: Findings:  chronic, worsening and severe right sided buttock pain radiating around to right lateral hip and down lateral leg. Patient continues to have severe pain despite good conservative therapy such as rest and use of medications.  Patient's clinical presentation and exam are consistent with L5 nerve pattern.  There is mild to moderate spinal canal stenosis at the level of L4-5 and a new right sided disc protrusion at L4-L5 with impingement of the right L4 nerve root. We did discuss treatment plan in detail today. Patient is unsure she wants to proceed with injection at this time. She would like to consult with Veronica Peers, NP and [name] to discuss possible surgical options before moving forward with injection. We did discuss possible right L5 transforaminal epidural steroid injection. I did explain that we would not need to take her off Eliquis for this injection. She will let us know how she would like to proceed. We are happy to get back in for injection. No red flag symptoms noted upon exam today.     Meds & Orders:  No orders of the defined types were placed in this encounter.  No orders of the defined types were placed in this encounter.   Follow-up: Return if symptoms worsen or fail to improve.   Procedures: No procedures performed      Clinical History: CT Myelogram IMPRESSION: 1. 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 Correlation with radiographs is recommended  prior to any operative intervention. 2. Progressive multilevel lumbar spondylosis as described above. New superiorly migrating right subarticular and foraminal disc protrusion at L4-L5 with impingement of the right L4 nerve root. Progressive mild-to-moderate spinal canal stenosis at this level. 3. Progressive moderate right lateral recess stenosis at L3-L4. 4. Interval posterior decompression at L5-S1 with resolved spinal canal stenosis. 5.  Aortic Atherosclerosis (ICD10-I70.0).     Electronically Signed   By: [name] M.D.   On: 10/23/2022 15:25   She reports that she has never smoked. She has never used smokeless tobacco. No results for input(s): "HGBA1C", "LABURIC" in the last 8760 hours.  Objective:  VS:  HT:    WT:   BMI:     BP:   HR: bpm  TEMP: ( )  RESP:  Physical Exam Vitals and nursing note reviewed.  HENT:     Head: Normocephalic and atraumatic.     Right Ear: External ear normal.     Left Ear: External ear normal.     Nose: Nose normal.     Mouth/Throat:     Mouth: Mucous membranes are moist.  Eyes:     Extraocular Movements: Extraocular movements intact.  Cardiovascular:     Rate and Rhythm: Normal rate.     Pulses: Normal pulses.  Pulmonary:     Effort: Pulmonary effort is normal.  Abdominal:     General: Abdomen is flat. There is no distension.  Musculoskeletal:        General: Tenderness present.     Cervical back: Normal range of motion.     Comments: Patient is slow to rise from seated position to standing. Good lumbar range of motion. No pain noted with facet loading. 5/5 strength noted with bilateral hip flexion, knee flexion/extension, ankle dorsiflexion/plantarflexion and EHL. No clonus noted bilaterally. No pain upon palpation of greater trochanters. No pain with internal/external rotation of bilateral hips. Negative slump test bilaterally. Dysesthesias noted to right L5 dermatome. Ambulates with cane, gait unsteady.  Skin:    General:  Skin is warm and dry.     Capillary Refill: Capillary refill takes less than 2 seconds.  Neurological:     General: No focal deficit present.     Mental Status: She is alert and oriented to person, place, and time.  Psychiatric:        Mood and Affect: Mood normal.        Behavior: Behavior normal.     Ortho Exam  Imaging: No results found.  Past Medical/Family/Surgical/Social History: Medications & Allergies reviewed per EMR, new medications updated. Patient Active Problem List   Diagnosis Date Noted   Sinus node dysfunction ([place]) 10/29/2019   S/P lumbar laminectomy 06/12/2019   Osteoporosis 01/28/2019   Trochanteric bursitis, right hip 05/16/2017   Cardiac pacemaker in situ 06/21/2016   Atypical atrial flutter ([place])    Chest pain 04/10/2016   Atrial fibrillation ([place]) 03/29/2016   Hyperglycemia 02/17/2015   Obese 02/17/2015   Insomnia 01/27/2014   Knee ankylosis, right knee 10/23/2013   Knee joint replacement by other means 10/01/2013   Arthritis of knee,  right 07/04/2013   Osteoarthritis of right knee 07/01/2013   Bradycardia 05/13/2013   HYPERCHOLESTEROLEMIA 06/02/2009   Essential hypertension 06/02/2009   ESOPHAGEAL STRICTURE 06/02/2009   GERD 06/02/2009   HIATAL HERNIA 06/02/2009   DIVERTICULOSIS, COLON 06/02/2009   Dysphagia, pharyngoesophageal phase 06/02/2009   Backache 05/01/2007   LEG PAIN, RIGHT 05/01/2007   Past Medical History:  Diagnosis Date   Allergic rhinitis due to pollen    Anxiety state, unspecified    Sitautional- pain   Arthritis    "right knee" (06/21/2016)   Asymptomatic varicose veins    Carpal tunnel syndrome    Chest pain, unspecified    Chronic lower back pain    "on the left side" (06/21/2016)   Constipation    Cramp of limb    Depressive disorder, not elsewhere classified    Diaphragmatic hernia without mention of obstruction or gangrene    Diaphragmatic hernia without mention of obstruction or gangrene    Disturbance of skin  sensation    Diverticulosis of colon (without mention of hemorrhage)    Dysrhythmia    Atrial Flutter   Enthesopathy of hip region    Esophageal reflux    , just occasional   History of blood transfusion    "w/both knee replacements"   History of duodenal ulcer    History of kidney stones    passed   Insomnia, unspecified    Lumbago    Migraine    "none since ~ 1990" (06/21/2016)   Myalgia and myositis, unspecified    Obesity, unspecified    Other abnormal blood chemistry    Other dysphagia    Other malaise and fatigue    Other nonspecific abnormal serum enzyme levels    Other specified cardiac dysrhythmias(427.89)    Pacemaker    Pain in joint, ankle and foot    Pain in joint, hand    Pain in joint, lower leg    Pain in joint, pelvic region and thigh    Pain in limb    Plantar fascial fibromatosis    Presence of permanent cardiac pacemaker    -St Jude   Reflux esophagitis    Sciatica    Spinal stenosis, unspecified region other than cervical    Stricture and stenosis of esophagus    Symptomatic menopausal or female climacteric states    Unspecified essential hypertension    Unspecified essential hypertension    Unspecified vitamin D deficiency    Family History  Problem Relation Age of Onset   Ovarian cancer Mother    Uterine cancer Mother    Cerebrovascular Accident Mother    Heart disease Father    Hypertension Brother    Obesity Daughter    Past Surgical History:  Procedure Laterality Date   BREAST BIOPSY Left 1990s X 2   CARDIOVERSION N/A 04/26/2016   Procedure: CARDIOVERSION;  Surgeon: Pixie Casino, MD;  Location: [facility];  Service: Cardiovascular;  Laterality: N/A;   CARDIOVERSION N/A 12/04/2022   Procedure: CARDIOVERSION;  Surgeon: [name], MD;  Location: [place];  Service: Cardiovascular;  Laterality: N/A;   CATARACT EXTRACTION W/ INTRAOCULAR LENS IMPLANT Left 08/03/1999   DR EPES    CATARACT EXTRACTION W/ INTRAOCULAR LENS  IMPLANT Right 2000   DR EPES   CHOLECYSTECTOMY OPEN  1989   DR BOWMAN   COLONOSCOPY  1988   Normal    DENTAL SURGERY Left 08/2016   EP IMPLANTABLE DEVICE N/A 06/21/2016   Procedure: Pacemaker Implant;  Surgeon:  Evans Lance, MD;  Location: [place] CV LAB;  Service: Cardiovascular;  Laterality: N/A;   ESOPHAGOGASTRODUODENOSCOPY (EGD) WITH ESOPHAGEAL DILATION  ~ 1982   Dr. Sharlett Iles   EXCISION OF ACTINIC KERATOSIS     DR LUPTON    EYE SURGERY     INSERT / [place] / REMOVE PACEMAKER     JOINT REPLACEMENT     KNEE ARTHROSCOPY Left 2003   KNEE ARTHROSCOPY Right 06/26/2013   KNEE CLOSED REDUCTION Right 10/23/2013   Procedure: CLOSED MANIPULATION RIGHT KNEE;  Surgeon: Mcarthur Rossetti, MD;  Location: [place];  Service: Orthopedics;  Laterality: Right;   LASER FOR CLOUDY CAP LEFT EYE Left 03/2006   DR DIGBY   LUMBAR LAMINECTOMY/DECOMPRESSION MICRODISCECTOMY N/A 06/12/2019   Procedure: Laminectomy and Foraminotomy - Lumbar four-Lumbar five;  Surgeon: Eustace Moore, MD;  Location: [place];  Service: Neurosurgery;  Laterality: N/A;   [facility], [facility]. Removed area on upper chest   SKIN SURGERY     May 2023, upper left chest area, perfored by the skin surgery center   TOTAL KNEE ARTHROPLASTY Left 04/2004   DR RENDALL   TOTAL KNEE ARTHROPLASTY Right 07/04/2013   Procedure: RIGHT TOTAL KNEE ARTHROPLASTY;  Surgeon: Mcarthur Rossetti, MD;  Location: [facility];  Service: Orthopedics;  Laterality: Right;   TRIGGER FINGER RELEASE Right 09/2018   VAGINAL HYSTERECTOMY  1979   Social History   Occupational History   Not on file  Tobacco Use   Smoking status: Never   Smokeless tobacco: Never  Vaping Use   Vaping Use: Never used  Substance and Sexual Activity   Alcohol use: No   Drug use: No   Sexual activity: Yes

## 2023-02-22 NOTE — Progress Notes (Signed)
Functional Pain Scale - descriptive words and definitions  Intense (8)    Cannot complete any ADLs without much assistance/cannot concentrate/conversation is difficult/unable to sleep and unable to use distraction. Severe range order  Average Pain  varies  Right hip pain that radiates down the right leg, started about 10-11 days ago. Numbness in right leg from thigh to foot

## 2023-02-23 ENCOUNTER — Encounter: Payer: Self-pay | Admitting: Physical Medicine and Rehabilitation

## 2023-02-26 ENCOUNTER — Ambulatory Visit: Payer: Medicare HMO | Admitting: Physical Medicine and Rehabilitation

## 2023-02-28 NOTE — Addendum Note (Signed)
Addended by: Michelle Nasuti on: 02/28/2023 10:28 AM   Modules accepted: Orders

## 2023-03-01 ENCOUNTER — Telehealth: Payer: Self-pay | Admitting: Physical Medicine and Rehabilitation

## 2023-03-01 ENCOUNTER — Other Ambulatory Visit: Payer: Self-pay | Admitting: Physical Medicine and Rehabilitation

## 2023-03-01 ENCOUNTER — Encounter: Payer: Self-pay | Admitting: Radiology

## 2023-03-01 DIAGNOSIS — M5416 Radiculopathy, lumbar region: Secondary | ICD-10-CM

## 2023-03-01 DIAGNOSIS — M961 Postlaminectomy syndrome, not elsewhere classified: Secondary | ICD-10-CM

## 2023-03-01 DIAGNOSIS — M48062 Spinal stenosis, lumbar region with neurogenic claudication: Secondary | ICD-10-CM

## 2023-03-01 DIAGNOSIS — Z6830 Body mass index (BMI) 30.0-30.9, adult: Secondary | ICD-10-CM | POA: Diagnosis not present

## 2023-03-01 NOTE — Telephone Encounter (Signed)
Pt called stating her Dr Ronnald Ramp sent referral for pt to have back injection. Please call pt at [phone].

## 2023-03-01 NOTE — Telephone Encounter (Signed)
Spoke with patient and she stated Dr. Ronnald Ramp and Joelene Millin told her the injection was the best way to go because they do not want to do surgery. They are sending a referral over for the injection. Please advise

## 2023-03-08 ENCOUNTER — Telehealth: Payer: Self-pay | Admitting: Physical Medicine and Rehabilitation

## 2023-03-08 DIAGNOSIS — F411 Generalized anxiety disorder: Secondary | ICD-10-CM

## 2023-03-08 NOTE — Telephone Encounter (Signed)
Asking if you received information from [facility] about her injection approval and wanting to schedule an appointment

## 2023-03-09 MED ORDER — DIAZEPAM 5 MG PO TABS: ORAL_TABLET | ORAL | 0 refills | Status: DC

## 2023-03-09 NOTE — Telephone Encounter (Signed)
Spoke with patient and scheduled injection for 03/13/23. Patient aware driver needed. Patient is needing pre-procedure Valium

## 2023-03-09 NOTE — Telephone Encounter (Signed)
Pre-procedure diazepam ordered for pre-operative anxiety.  

## 2023-03-09 NOTE — Addendum Note (Signed)
Addended by: Raymondo Band on: 03/09/2023 10:23 AM   Modules accepted: Orders

## 2023-03-13 ENCOUNTER — Encounter: Payer: Medicare HMO | Admitting: Physical Medicine and Rehabilitation

## 2023-03-15 ENCOUNTER — Other Ambulatory Visit: Payer: Self-pay | Admitting: Family

## 2023-03-16 ENCOUNTER — Ambulatory Visit: Payer: Medicare HMO | Admitting: Physical Medicine and Rehabilitation

## 2023-03-16 ENCOUNTER — Other Ambulatory Visit: Payer: Self-pay

## 2023-03-16 VITALS — BP 178/84 | HR 71

## 2023-03-16 DIAGNOSIS — M5416 Radiculopathy, lumbar region: Secondary | ICD-10-CM | POA: Diagnosis not present

## 2023-03-16 MED ORDER — METHYLPREDNISOLONE ACETATE 80 MG/ML IJ SUSP
80.0000 mg | Freq: Once | INTRAMUSCULAR | Status: AC
  Administered 2023-03-16: 80 mg

## 2023-03-16 NOTE — Patient Instructions (Signed)

## 2023-03-16 NOTE — Progress Notes (Unsigned)
Functional Pain Scale - descriptive words and definitions  Distressing (6)    Pain is present/unable to complete most ADLs limited by pain/sleep is difficult and active distraction is only marginal. Moderate range order  Average Pain 4   +Driver, -BT, -Dye Allergies.  Lower back pain on right side that radiates into the right leg. Walking makes pain better

## 2023-03-19 DIAGNOSIS — H401131 Primary open-angle glaucoma, bilateral, mild stage: Secondary | ICD-10-CM | POA: Diagnosis not present

## 2023-03-19 DIAGNOSIS — H16223 Keratoconjunctivitis sicca, not specified as Sjogren's, bilateral: Secondary | ICD-10-CM | POA: Diagnosis not present

## 2023-03-20 ENCOUNTER — Other Ambulatory Visit: Payer: Self-pay | Admitting: Cardiology

## 2023-03-20 ENCOUNTER — Ambulatory Visit (INDEPENDENT_AMBULATORY_CARE_PROVIDER_SITE_OTHER): Payer: Medicare HMO

## 2023-03-20 DIAGNOSIS — I4819 Other persistent atrial fibrillation: Secondary | ICD-10-CM

## 2023-03-20 DIAGNOSIS — I495 Sick sinus syndrome: Secondary | ICD-10-CM | POA: Diagnosis not present

## 2023-03-20 LAB — CUP PACEART REMOTE DEVICE CHECK
Battery Remaining Longevity: 35 mo
Battery Remaining Percentage: 37 %
Battery Voltage: 2.95 V
Brady Statistic AP VP Percent: 2.1 %
Brady Statistic AP VS Percent: 83 %
Brady Statistic AS VP Percent: 6.5 %
Brady Statistic AS VS Percent: 8.6 %
Brady Statistic RA Percent Paced: 85 %
Brady Statistic RV Percent Paced: 8.6 %
Date Time Interrogation Session: 20240326020013
Implantable Lead Connection Status: 753985
Implantable Lead Connection Status: 753985
Implantable Lead Implant Date: 20170628
Implantable Lead Implant Date: 20170628
Implantable Lead Location: 753859
Implantable Lead Location: 753860
Implantable Pulse Generator Implant Date: 20170628
Lead Channel Impedance Value: 480 Ohm
Lead Channel Impedance Value: 580 Ohm
Lead Channel Pacing Threshold Amplitude: 0.75 V
Lead Channel Pacing Threshold Amplitude: 1.5 V
Lead Channel Pacing Threshold Pulse Width: 0.5 ms
Lead Channel Pacing Threshold Pulse Width: 0.5 ms
Lead Channel Sensing Intrinsic Amplitude: 2 mV
Lead Channel Sensing Intrinsic Amplitude: 9.4 mV
Lead Channel Setting Pacing Amplitude: 2 V
Lead Channel Setting Pacing Amplitude: 3 V
Lead Channel Setting Pacing Pulse Width: 0.5 ms
Lead Channel Setting Sensing Sensitivity: 2 mV
Pulse Gen Model: 2272
Pulse Gen Serial Number: 7910313

## 2023-03-20 NOTE — Telephone Encounter (Signed)
Pt last saw [name], [place] on 02/20/23, last labs 11/27/22 Creat 0.80, age 89, weight 81.7kg, based on specified criteria pt is on appropriate dosage of Elqiuis 5mg  BID for afib.  Will refill rx.

## 2023-03-21 NOTE — Progress Notes (Signed)
[name] - 89 y.o. female MRN [id number]  Date of birth: [birth year]  Office Visit Note: Visit Date: 03/16/2023 PCP: Lauree Chandler, NP Referred by: Eustace Moore, MD  Subjective: Chief Complaint  Patient presents with   Lower Back - Pain   HPI:  [name] is a 89 y.o. female who comes in today at the request of Dr. Sherley Bounds for planned Right L5-S1 Lumbar Transforaminal epidural steroid injection with fluoroscopic guidance.  The patient has failed conservative care including home exercise, medications, time and activity modification.  This injection will be diagnostic and hopefully therapeutic.  Please see requesting physician notes for further details and justification.   ROS Otherwise per HPI.  Assessment & Plan: Visit Diagnoses:    ICD-10-CM   1. Lumbar radiculopathy  M54.16 XR C-ARM NO REPORT    Epidural Steroid injection    methylPREDNISolone acetate (DEPO-MEDROL) injection 80 mg      Plan: No additional findings.   Meds & Orders:  Meds ordered this encounter  Medications   methylPREDNISolone acetate (DEPO-MEDROL) injection 80 mg    Orders Placed This Encounter  Procedures   XR C-ARM NO REPORT   Epidural Steroid injection    Follow-up: Return for visit to requesting provider as needed.   Procedures: No procedures performed  Lumbosacral Transforaminal Epidural Steroid Injection - Sub-Pedicular Approach with Fluoroscopic Guidance  Patient: [name]      Date of Birth: 08-06-1931 MRN: [id number] PCP: Lauree Chandler, NP      Visit Date: 03/16/2023   Universal Protocol:    Date/Time: 03/16/2023  Consent Given By: the patient  Position: PRONE  Additional Comments: Vital signs were monitored before and after the procedure. Patient was prepped and draped in the usual sterile fashion. The correct patient, procedure, and site was verified.   Injection Procedure Details:   Procedure diagnoses: Lumbar radiculopathy [M54.16]     Meds Administered:  Meds ordered this encounter  Medications   methylPREDNISolone acetate (DEPO-MEDROL) injection 80 mg    Laterality: Right  Location/Site: L5  Needle:5.0 in., 22 ga.  Short bevel or Quincke spinal needle  Needle Placement: Transforaminal  Findings:    -Comments: Excellent flow of contrast along the nerve, nerve root and into the epidural space.  Procedure Details: After squaring off the end-plates to get a true AP view, the C-arm was positioned so that an oblique view of the foramen as noted above was visualized. The target area is just inferior to the "nose of the scotty dog" or sub pedicular. The soft tissues overlying this structure were infiltrated with 2-3 ml. of 1% Lidocaine without Epinephrine.  The spinal needle was inserted toward the target using a "trajectory" view along the fluoroscope beam.  Under AP and lateral visualization, the needle was advanced so it did not puncture dura and was located close the 6 O'Clock position of the pedical in AP tracterory. Biplanar projections were used to confirm position. Aspiration was confirmed to be negative for CSF and/or blood. A 1-2 ml. volume of Isovue-250 was injected and flow of contrast was noted at each level. Radiographs were obtained for documentation purposes.   After attaining the desired flow of contrast documented above, a 0.5 to 1.0 ml test dose of 0.25% Marcaine was injected into each respective transforaminal space.  The patient was observed for 90 seconds post injection.  After no sensory deficits were reported, and normal lower extremity motor function was noted,   the above injectate  was administered so that equal amounts of the injectate were placed at each foramen (level) into the transforaminal epidural space.   Additional Comments:  No complications occurred Dressing: 2 x 2 sterile gauze and Band-Aid    Post-procedure details: Patient was observed during the procedure. Post-procedure  instructions were reviewed.  Patient left the clinic in stable condition.    Clinical History: CT Myelogram IMPRESSION: 1. 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 Correlation with radiographs is recommended prior to any operative intervention. 2. Progressive multilevel lumbar spondylosis as described above. New superiorly migrating right subarticular and foraminal disc protrusion at L4-L5 with impingement of the right L4 nerve root. Progressive mild-to-moderate spinal canal stenosis at this level. 3. Progressive moderate right lateral recess stenosis at L3-L4. 4. Interval posterior decompression at L5-S1 with resolved spinal canal stenosis. 5.  Aortic Atherosclerosis (ICD10-I70.0).     Electronically Signed   By: Titus Dubin M.D.   On: 10/23/2022 15:25     Objective:  VS:  HT:    WT:   BMI:     BP:(!) 178/84  HR:71bpm  TEMP: ( )  RESP:  Physical Exam Vitals and nursing note reviewed.  Constitutional:      General: She is not in acute distress.    Appearance: Normal appearance. She is not ill-appearing.  HENT:     Head: Normocephalic and atraumatic.     Right Ear: External ear normal.     Left Ear: External ear normal.  Eyes:     Extraocular Movements: Extraocular movements intact.  Cardiovascular:     Rate and Rhythm: Normal rate.     Pulses: Normal pulses.  Pulmonary:     Effort: Pulmonary effort is normal. No respiratory distress.  Abdominal:     General: There is no distension.     Palpations: Abdomen is soft.  Musculoskeletal:        General: Tenderness present.     Cervical back: Neck supple.     Right lower leg: No edema.     Left lower leg: No edema.     Comments: Patient has good distal strength with no pain over the greater trochanters.  No clonus or focal weakness.  Skin:    Findings: No erythema, lesion or rash.  Neurological:     General: No focal deficit present.     Mental Status: She is alert and  oriented to person, place, and time.     Sensory: No sensory deficit.     Motor: No weakness or abnormal muscle tone.     Coordination: Coordination normal.  Psychiatric:        Mood and Affect: Mood normal.        Behavior: Behavior normal.      Imaging: No results found.

## 2023-03-21 NOTE — Procedures (Signed)
Lumbosacral Transforaminal Epidural Steroid Injection - Sub-Pedicular Approach with Fluoroscopic Guidance  Patient: [name]      Date of Birth: [birth year] MRN: [id number] PCP: Lauree Chandler, NP      Visit Date: 03/16/2023   Universal Protocol:    Date/Time: 03/16/2023  Consent Given By: the patient  Position: PRONE  Additional Comments: Vital signs were monitored before and after the procedure. Patient was prepped and draped in the usual sterile fashion. The correct patient, procedure, and site was verified.   Injection Procedure Details:   Procedure diagnoses: Lumbar radiculopathy [M54.16]    Meds Administered:  Meds ordered this encounter  Medications   methylPREDNISolone acetate (DEPO-MEDROL) injection 80 mg    Laterality: Right  Location/Site: L5  Needle:5.0 in., 22 ga.  Short bevel or Quincke spinal needle  Needle Placement: Transforaminal  Findings:    -Comments: Excellent flow of contrast along the nerve, nerve root and into the epidural space.  Procedure Details: After squaring off the end-plates to get a true AP view, the C-arm was positioned so that an oblique view of the foramen as noted above was visualized. The target area is just inferior to the "nose of the scotty dog" or sub pedicular. The soft tissues overlying this structure were infiltrated with 2-3 ml. of 1% Lidocaine without Epinephrine.  The spinal needle was inserted toward the target using a "trajectory" view along the fluoroscope beam.  Under AP and lateral visualization, the needle was advanced so it did not puncture dura and was located close the 6 O'Clock position of the pedical in AP tracterory. Biplanar projections were used to confirm position. Aspiration was confirmed to be negative for CSF and/or blood. A 1-2 ml. volume of Isovue-250 was injected and flow of contrast was noted at each level. Radiographs were obtained for documentation purposes.   After attaining the desired  flow of contrast documented above, a 0.5 to 1.0 ml test dose of 0.25% Marcaine was injected into each respective transforaminal space.  The patient was observed for 90 seconds post injection.  After no sensory deficits were reported, and normal lower extremity motor function was noted,   the above injectate was administered so that equal amounts of the injectate were placed at each foramen (level) into the transforaminal epidural space.   Additional Comments:  No complications occurred Dressing: 2 x 2 sterile gauze and Band-Aid    Post-procedure details: Patient was observed during the procedure. Post-procedure instructions were reviewed.  Patient left the clinic in stable condition.

## 2023-03-31 NOTE — Progress Notes (Unsigned)
Cardiology Office Note   Date:  12/28/2022   ID:  [name], DOB [birth year], MRN [id number]  PCP:  Sharon Seller, NP  Cardiologist:   Tyrez Berrios [place], MD   Chief Complaint  Patient presents with   Atrial Fibrillation       History of Present Illness: [name] is a 89 y.o. female is seen for follow up Afib and dyspnea. She has a long history of marked sinus bradycardia and atrial fibrillation/flutter. She was seen initially in 2014. Echo at that time showed mild LAE otherwise normal. Holter showed mean HR 59 with lowest HR 43 and peak HR 109.    On March 9,2017 she noted an increased HR by BP monitor up to 153. At this time she felt marked indigestion from her waist to her neck. She felt her heart fluttering.   She was found to be in Afib with RVR. She was started on Eliquis and metoprolol. Myoview study and Echo were normal.   She later had attempt at DCCV. She was in an atypical atrial flutter at that time. DCCV resulted in very prolonged pauses > 6 seconds and return to flutter. She was seen by Dr. Elberta Fortis and placed on flecainide. This did convert her to NSR but made her feel very sick with nausea, dizziness, and extreme fatigue. Flecainide was discontinued.. She continued to have marked bradycardia and underwent PPM placement on 06/21/16. When seen in September 2018 by Dr. Ladona Ridgel she had an Afib burden of 94%. She was started on propafenone for her Afib. Initially this medication caused her to have more nausea but this has improved.  On her subsequent  checks her Afib burden was less than 1% since December 2019.   She was seen by Dr Ladona Ridgel on 11/17. By pacer check she was in Afib continually. Rate controlled. On follow up she noted some increased SOB and fatigue over the past month or two. Really is not aware of palpitations. No edema. No chest pain. She did undergo DCCV on 12/04/22. On follow up pacer check on Dec 28 she was maintaining NSR. Was seen in Feb by EP and still  doing well.   She notes that she feels like a new woman. Her breathing and fatigue are much better. Not as dizzy.    Past Medical History:  Diagnosis Date   Allergic rhinitis due to pollen    Anxiety state, unspecified    Sitautional- pain   Arthritis    "right knee" (06/21/2016)   Asymptomatic varicose veins    Carpal tunnel syndrome    Chest pain, unspecified    Chronic lower back pain    "on the left side" (06/21/2016)   Constipation    Cramp of limb    Depressive disorder, not elsewhere classified    Diaphragmatic hernia without mention of obstruction or gangrene    Diaphragmatic hernia without mention of obstruction or gangrene    Disturbance of skin sensation    Diverticulosis of colon (without mention of hemorrhage)    Dysrhythmia    Atrial Flutter   Enthesopathy of hip region    Esophageal reflux    , just occasional   History of blood transfusion    "w/both knee replacements"   History of duodenal ulcer    History of kidney stones    passed   Insomnia, unspecified    Lumbago    Migraine    "none since ~ 1990" (06/21/2016)   Myalgia and myositis, unspecified  Obesity, unspecified    Other abnormal blood chemistry    Other dysphagia    Other malaise and fatigue    Other nonspecific abnormal serum enzyme levels    Other specified cardiac dysrhythmias(427.89)    Pacemaker    Pain in joint, ankle and foot    Pain in joint, hand    Pain in joint, lower leg    Pain in joint, pelvic region and thigh    Pain in limb    Plantar fascial fibromatosis    Presence of permanent cardiac pacemaker    -St Jude   Reflux esophagitis    Sciatica    Spinal stenosis, unspecified region other than cervical    Stricture and stenosis of esophagus    Symptomatic menopausal or female climacteric states    Unspecified essential hypertension    Unspecified essential hypertension    Unspecified vitamin D deficiency     Past Surgical History:  Procedure Laterality Date    BREAST BIOPSY Left 1990s X 2   CARDIOVERSION N/A 04/26/2016   Procedure: CARDIOVERSION;  Surgeon: Chrystie NoseKenneth C Hilty, MD;  [facility];  Service: Cardiovascular;  Laterality: N/A;   CARDIOVERSION N/A 12/04/2022   Procedure: CARDIOVERSION;  Surgeon: Parke PoissonAcharya, Gayatri A, MD;  [facility];  Service: Cardiovascular;  Laterality: N/A;   CATARACT EXTRACTION W/ INTRAOCULAR LENS IMPLANT Left 08/03/1999   DR EPES    CATARACT EXTRACTION W/ INTRAOCULAR LENS IMPLANT Right 2000   DR EPES   CHOLECYSTECTOMY OPEN  1989   DR BOWMAN   COLONOSCOPY  1988   Normal    DENTAL SURGERY Left 08/2016   EP IMPLANTABLE DEVICE N/A 06/21/2016   Procedure: Pacemaker Implant;  Surgeon: Marinus MawGregg W Taylor, MD;  Location: [facility] INVASIVE CV LAB;  Service: Cardiovascular;  Laterality: N/A;   ESOPHAGOGASTRODUODENOSCOPY (EGD) WITH ESOPHAGEAL DILATION  ~ 1982   Dr. Jarold MottoPatterson   EXCISION OF ACTINIC KERATOSIS     DR LUPTON    EYE SURGERY     INSERT / REPLACE / REMOVE PACEMAKER     JOINT REPLACEMENT     KNEE ARTHROSCOPY Left 2003   KNEE ARTHROSCOPY Right 06/26/2013   KNEE CLOSED REDUCTION Right 10/23/2013   Procedure: CLOSED MANIPULATION RIGHT KNEE;  Surgeon: Kathryne Hitchhristopher Y Blackman, MD;  Location: [facility] OR;  Service: Orthopedics;  Laterality: Right;   LASER FOR CLOUDY CAP LEFT EYE Left 03/2006   DR DIGBY   LUMBAR LAMINECTOMY/DECOMPRESSION MICRODISCECTOMY N/A 06/12/2019   Procedure: Laminectomy and Foraminotomy - Lumbar four-Lumbar five;  Surgeon: Tia AlertJones, David S, MD;  [facility];  Service: Neurosurgery;  Laterality: N/A;   SKIN SURGERY     [facility], Dr.Gray. Removed area on upper chest   SKIN SURGERY     May 2023, upper left chest area, perfored by the skin surgery center   TOTAL KNEE ARTHROPLASTY Left 04/2004   DR RENDALL   TOTAL KNEE ARTHROPLASTY Right 07/04/2013   Procedure: RIGHT TOTAL KNEE ARTHROPLASTY;  Surgeon: Kathryne Hitchhristopher Y Blackman, MD;  Location: [facility] ORS;  Service: Orthopedics;  Laterality:  Right;   TRIGGER FINGER RELEASE Right 09/2018   VAGINAL HYSTERECTOMY  1979     Current Outpatient Medications  Medication Sig Dispense Refill   acetaminophen (TYLENOL) 500 MG tablet Take 500 mg by mouth every 6 (six) hours as needed for moderate pain.     alendronate (FOSAMAX) 70 MG tablet Take 1 tablet (70 mg total) by mouth every 7 (seven) days. Take with a full glass of water on an  empty stomach. 4 tablet 11   antiseptic oral rinse (BIOTENE) LIQD 15 mLs by Mouth Rinse route 2 (two) times daily.     Artificial Saliva (ACT DRY MOUTH) LOZG Use as directed 1 drop in the mouth or throat as needed (dry mouth).     bimatoprost (LUMIGAN) 0.01 % SOLN Place 1 drop into both eyes at bedtime.      Calcium Citrate-Vitamin D (CALCIUM + D PO) Take 1 tablet by mouth daily.     Cholecalciferol (VITAMIN D) 125 MCG (5000 UT) CAPS Take 5,000 Units by mouth daily.     dorzolamide (TRUSOPT) 2 % ophthalmic solution Place 1 drop into both eyes 3 (three) times daily.     ELIQUIS 5 MG TABS tablet TAKE 1 TABLET BY MOUTH TWICE A DAY 180 tablet 1   esomeprazole (NEXIUM) 20 MG capsule TAKE 1 CAPSULE (20 MG TOTAL) BY MOUTH AS NEEDED. 90 capsule 1   ezetimibe (ZETIA) 10 MG tablet TAKE ONE TABLET BY MOUTH ONCE DAILY 90 tablet 3   methylcellulose (ARTIFICIAL TEARS) 1 % ophthalmic solution Place 1 drop into both eyes daily as needed (for dry eyes).      metoprolol tartrate (LOPRESSOR) 25 MG tablet TAKE 1 TABLET BY MOUTH TWICE A DAY 180 tablet 0   OVER THE COUNTER MEDICATION Take 1 capsule by mouth 2 (two) times daily. HydroEye supplement     propafenone (RYTHMOL SR) 225 MG 12 hr capsule TAKE 1 CAPSULE BY MOUTH TWICE A DAY 180 capsule 2   traZODone (DESYREL) 50 MG tablet Take 50 mg by mouth at bedtime as needed for sleep.     trolamine salicylate (ASPERCREME) 10 % cream Apply 1 application topically as needed for muscle pain (for knee pain).     valsartan-hydrochlorothiazide (DIOVAN-HCT) 320-25 MG tablet TAKE ONE TABLET BY  MOUTH DAILY 90 tablet 1   No current facility-administered medications for this visit.    Allergies:   Advil [ibuprofen], Aleve [naproxen sodium], Aspirin, Atorvastatin, Celebrex [celecoxib], Diclofenac, Doxycycline, Nsaids, Other, Pravastatin, Timolol, and Metoclopramide hcl    Social History:  The patient  reports that she has never smoked. She has never used smokeless tobacco. She reports that she does not drink alcohol and does not use drugs.   Family History:  The patient's family history includes Cerebrovascular Accident in her mother; Heart disease in her father; Hypertension in her brother; Obesity in her daughter; Ovarian cancer in her mother; Uterine cancer in her mother.    ROS:  Please see the history of present illness.   Otherwise, review of systems are positive for none.   All other systems are reviewed and negative.    PHYSICAL EXAM: VS:  BP (!) 159/79   Pulse 64   Ht 5\' 2"  (1.575 m)   Wt 186 lb 9.6 oz (84.6 kg)   SpO2 93%   BMI 34.13 kg/m  , BMI Body mass index is 34.13 kg/m. GENERAL:  Well appearing, obese WF in NAD HEENT:  PERRL, EOMI, sclera are clear. Oropharynx is clear. NECK:  No jugular venous distention, carotid upstroke brisk and symmetric, no bruits, no thyromegaly or adenopathy LUNGS:  clear  CHEST:  Unremarkable HEART:  IRRR,  PMI not displaced or sustained,S1 and S2 within normal limits, no S3, no S4: no clicks, no rubs, no murmurs ABD:  Soft, nontender. BS +, no masses or bruits. No hepatomegaly, no splenomegaly EXT:  2 + pulses throughout, no edema, no cyanosis no clubbing SKIN:  Warm and dry.  No rashes NEURO:  Alert and oriented x 3. Cranial nerves II through XII intact. PSYCH:  Cognitively intact   EKG:  EKG is ordered today. Atrially paced rate 64. LAD. Nonspecific ST-T abnormality. Normal QTc. I have personally reviewed and interpreted this study.   Recent Labs: 07/17/2022: ALT 16; TSH 1.76 11/27/2022: BUN 19; Creatinine, Ser 0.80;  Hemoglobin 12.1; Platelets 184; Potassium 4.1; Sodium 141    Lipid Panel    Component Value Date/Time   CHOL 188 07/17/2022 1436   CHOL 179 03/27/2016 0853   TRIG 347 (H) 07/17/2022 1436   HDL 36 (L) 07/17/2022 1436   HDL 42 03/27/2016 0853   CHOLHDL 5.2 (H) 07/17/2022 1436   VLDL 42 (H) 08/06/2017 0918   LDLCALC 106 (H) 07/17/2022 1436      Wt Readings from Last 3 Encounters:  12/28/22 186 lb 9.6 oz (84.6 kg)  12/11/22 185 lb 12.8 oz (84.3 kg)  12/04/22 185 lb 6.5 oz (84.1 kg)      Other studies Reviewed: Additional studies/ records that were reviewed today include:  Echo: 09/06/17: Study Conclusions   - Left ventricle: The cavity size was normal. Wall thickness was   increased in a pattern of mild LVH. Systolic function was normal.   The estimated ejection fraction was in the range of 55% to 60%.   Doppler parameters are consistent with both elevated ventricular   end-diastolic filling pressure and elevated left atrial filling   pressure. - Left atrium: The atrium was moderately dilated. - Atrial septum: There was increased thickness of the septum,   consistent with lipomatous hypertrophy. No defect or patent   foramen ovale was identified  Echo 10/7//21: IMPRESSIONS     1. Left ventricular ejection fraction, by estimation, is 55 to 60%. The  left ventricle has normal function. The left ventricle has no regional  wall motion abnormalities. There is mild left ventricular hypertrophy.  Left ventricular diastolic parameters  are consistent with Grade I diastolic dysfunction (impaired relaxation).  The E/e' is 11.   2. Right ventricular systolic function is normal. The right ventricular  size is normal.   3. Left atrial size was moderately dilated.   4. The mitral valve is grossly normal. No evidence of mitral valve  regurgitation.   5. The aortic valve is tricuspid. There is mild calcification of the  aortic valve. Aortic valve regurgitation is not visualized.    6. The inferior vena cava is normal in size with greater than 50%  respiratory variability, suggesting right atrial pressure of 3 mmHg.   Comparison(s): No significant change from prior study.   ASSESSMENT AND PLAN:  1.   Atrial fibrillation/flutter with RVR.  Intolerant of flecainide.  s/p PPM placement for marked post conversion pauses and bradycardia.  Has been on  propafenone with very low Afib  burden for 6 years until  November when she developed sustained AFib.  Followed by Dr Ladona Ridgelaylor. She has been compliant with her medication. She was symptomatic. She is now s/p  DCCV on Dec 11. Symptoms much better. Continue propafenone and anticoagulation. If Afib recurs may need to consider alternative AAD therapy. Keep follow with EP in February. I will follow up in 3 months.  2. HTN well controlled.  3. Mild hypercholesterolemia.     Signed, Shamikia Linskey Swazil[place]Jordan, MD,FACC  12/28/2022 2:48 PM    [facility] [address], [place], [place], [id number] Phone [phone], Fax [phone]

## 2023-04-03 DIAGNOSIS — M5416 Radiculopathy, lumbar region: Secondary | ICD-10-CM | POA: Diagnosis not present

## 2023-04-04 ENCOUNTER — Telehealth: Payer: Self-pay | Admitting: *Deleted

## 2023-04-04 ENCOUNTER — Encounter: Payer: Self-pay | Admitting: Cardiology

## 2023-04-04 ENCOUNTER — Ambulatory Visit: Payer: Medicare HMO | Attending: Cardiology | Admitting: Cardiology

## 2023-04-04 VITALS — BP 132/74 | HR 69 | Ht 63.0 in | Wt 185.2 lb

## 2023-04-04 DIAGNOSIS — D6869 Other thrombophilia: Secondary | ICD-10-CM

## 2023-04-04 DIAGNOSIS — Z950 Presence of cardiac pacemaker: Secondary | ICD-10-CM

## 2023-04-04 DIAGNOSIS — I4819 Other persistent atrial fibrillation: Secondary | ICD-10-CM

## 2023-04-04 DIAGNOSIS — I495 Sick sinus syndrome: Secondary | ICD-10-CM

## 2023-04-04 NOTE — Telephone Encounter (Signed)
   Pre-operative Risk Assessment    Patient Name: [name]  DOB: 11/17/1931 MRN: [id number]      Request for Surgical Clearance    Procedure:   L3-4, L4-5 LUMBARLAMINECTOMY  Date of Surgery:  Clearance TBD                                 Surgeon:  Tia Alert, MD Surgeon's Group or Practice Name:  [facility] Phone number:  [phone] Fax number:  [phone]   Type of Clearance Requested:   - Medical  - Pharmacy:  Hold Apixaban (Eliquis) NOT INDICATED HOW LONG   Type of Anesthesia:  General    Additional requests/questions:    [name]   04/04/2023, 6:51 AM

## 2023-04-04 NOTE — Patient Instructions (Signed)
Medication Instructions:  The current medical regimen is effective;  continue present plan and medications.  *If you need a refill on your cardiac medications before your next appointment, please call your pharmacy*   Follow-Up: At [facility], you and your health needs are our priority.  As part of our continuing mission to provide you with exceptional heart care, we have created designated Provider Care Teams.  These Care Teams include your primary Cardiologist (physician) and Advanced Practice Providers (APPs -  Physician Assistants and Nurse Practitioners) who all work together to provide you with the care you need, when you need it.  We recommend signing up for the patient portal called "MyChart".  Sign up information is provided on this After Visit Summary.  MyChart is used to connect with patients for Virtual Visits (Telemedicine).  Patients are able to view lab/test results, encounter notes, upcoming appointments, etc.  Non-urgent messages can be sent to your provider as well.   To learn more about what you can do with MyChart, go to [email or web address].    Your next appointment:   3 month(s)  Provider:   Peter Sw[place], MD

## 2023-04-04 NOTE — Telephone Encounter (Signed)
Patient with diagnosis of atrial fibrillation on Eliquis for anticoagulation.    Procedure: L3-4, L4-5  lumbar laminectomy Date of procedure: TBD   CHA2DS2-VASc Score = 4   This indicates a 4.8% annual risk of stroke. The patient's score is based upon: CHF History: 0 HTN History: 1 Diabetes History: 0 Stroke History: 0 Vascular Disease History: 0 Age Score: 2 Gender Score: 1    CrCl 61 Platelet count 184  Per office protocol, patient can hold Eliquis for 3 days prior to procedure.   Patient will not need bridging with Lovenox (enoxaparin) around procedure.  **This guidance is not considered finalized until pre-operative APP has relayed final recommendations.**

## 2023-04-04 NOTE — Telephone Encounter (Signed)
   Name: [name]  DOB: 01/23/1931  MRN: [id number]   Primary Cardiologist: Peter Swaz[place], MD  Chart reviewed as part of pre-operative protocol coverage. [name] was last seen on 04/04/2023 by Dr. Swaziland.  Per Dr. Elvis Coil note: "She is cleared to proceed with planned lumbar laminectomy. May hold Eliquis for 3 days prior. Will forward note to Dr Yetta Barre."  Therefore, based on ACC/AHA guidelines, the patient would be at acceptable risk for the planned procedure without further cardiovascular testing.   Per pharm D: Patient with diagnosis of atrial fibrillation on Eliquis for anticoagulation.     Procedure: L3-4, L4-5  lumbar laminectomy Date of procedure: TBD     CHA2DS2-VASc Score = 4   This indicates a 4.8% annual risk of stroke. The patient's score is based upon: CHF History: 0 HTN History: 1 Diabetes History: 0 Stroke History: 0 Vascular Disease History: 0 Age Score: 2 Gender Score: 1     CrCl 61 Platelet count 184   Per office protocol, patient can hold Eliquis for 3 days prior to procedure.   Patient will not need bridging with Lovenox (enoxaparin) around procedure.  I will route this recommendation to the requesting party via Epic fax function and remove from pre-op pool. Please call with questions.  Carlos Levering, NP 04/04/2023, 3:09 PM

## 2023-04-06 ENCOUNTER — Other Ambulatory Visit: Payer: Self-pay | Admitting: Neurological Surgery

## 2023-04-17 ENCOUNTER — Other Ambulatory Visit: Payer: Self-pay | Admitting: Nurse Practitioner

## 2023-04-20 ENCOUNTER — Other Ambulatory Visit: Payer: Self-pay | Admitting: Neurological Surgery

## 2023-04-20 ENCOUNTER — Encounter: Payer: Self-pay | Admitting: Internal Medicine

## 2023-04-20 ENCOUNTER — Other Ambulatory Visit: Payer: Self-pay | Admitting: Family

## 2023-04-20 NOTE — Pre-Procedure Instructions (Signed)
Surgical Instructions    Your procedure is scheduled on Monday, May 6th.  Report to [facility] Main Entrance "A" at 05:30 A.M., then check in with the Admitting office.  Call this number if you have problems the morning of surgery:  [phone]  If you have any questions prior to your surgery date call [phone]: Open Monday-Friday 8am-4pm If you experience any cold or flu symptoms such as cough, fever, chills, shortness of breath, etc. between now and your scheduled surgery, please notify us at the above number.     Remember:  Do not eat or drink after midnight the night before your surgery     Take these medicines the morning of surgery with A SIP OF WATER  dorzolamide (TRUSOPT) eye drops ezetimibe (ZETIA)  metoprolol tartrate (LOPRESSOR)  predniSONE (DELTASONE)  propafenone (RYTHMOL SR)   If needed: acetaminophen (TYLENOL)  diazepam (VALIUM)  esomeprazole (NEXIUM)  loratadine (CLARITIN)  methylcellulose (ARTIFICIAL TEARS)  sodium chloride (OCEAN) nasal spray traMADol (ULTRAM)    Hold Eliquis 3 days prior to surgery.  As of today, STOP taking any Aspirin (unless otherwise instructed by your surgeon) Aleve, Naproxen, Ibuprofen, Motrin, Advil, Goody's, BC's, all herbal medications, fish oil, and all vitamins.                     Do NOT Smoke (Tobacco/Vaping) for 24 hours prior to your procedure.  If you use a CPAP at night, you may bring your mask/headgear for your overnight stay.   Contacts, glasses, piercing's, hearing aid's, dentures or partials may not be worn into surgery, please bring cases for these belongings.    For patients admitted to the hospital, discharge time will be determined by your treatment team.   Patients discharged the day of surgery will not be allowed to drive home, and someone needs to stay with them for 24 hours.  SURGICAL WAITING ROOM VISITATION Patients having surgery or a procedure may have no more than 2 support people in the waiting  area - these visitors may rotate.   Children under the age of 45 must have an adult with them who is not the patient. If the patient needs to stay at the hospital during part of their recovery, the visitor guidelines for inpatient rooms apply. Pre-op nurse will coordinate an appropriate time for 1 support person to accompany patient in pre-op.  This support person may not rotate.   Please refer to the [facility] website for the visitor guidelines for Inpatients (after your surgery is over and you are in a regular room).      Day of Surgery: Take a shower with CHG soap. Do not wear jewelry or makeup Do not wear lotions, powders, perfumes, or deodorant. Do not shave 48 hours prior to surgery.   Do not bring valuables to the hospital. [facility] is not responsible for any belongings or valuables. Do not wear nail polish, gel polish, artificial nails, or any other type of covering on natural nails (fingers and toes) If you have artificial nails or gel coating that need to be removed by a nail salon, please have this removed prior to surgery. Artificial nails or gel coating may interfere with anesthesia's ability to adequately monitor your vital signs. Wear Clean/Comfortable clothing the morning of surgery Remember to brush your teeth WITH YOUR REGULAR TOOTHPASTE.   Please read over the following fact sheets that you were given.    If you received a COVID test during your pre-op visit  it  is requested that you wear a mask when out in public, stay away from anyone that may not be feeling well and notify your surgeon if you develop symptoms. If you have been in contact with anyone that has tested positive in the last 10 days please notify you surgeon.

## 2023-04-20 NOTE — Progress Notes (Signed)
PERIOPERATIVE PRESCRIPTION FOR IMPLANTED CARDIAC DEVICE PROGRAMMING  Patient Information: Name:  [name]  DOB:  June 17, 1931  MRN:  [id number]  Planned Procedure:  Bilateral L3-L4 decompression/laminectomy  Surgeon:  Dr. Marikay Alar  Date of Procedure:  04/30/23  Cautery will be used.  Position during surgery:  prone   Device Information:  Clinic EP Physician:  Lewayne Bunting, MD   Device Type:  Pacemaker Manufacturer and Phone #:  St. Jude/[employer]ott: [phone] Pacemaker Dependent?:  Yes.   Date of Last Device Check:  03/20/2023 Normal Device Function?:  Yes.    Electrophysiologist's Recommendations:  Have magnet available. Provide continuous ECG monitoring when magnet is used or reprogramming is to be performed.  Procedure will likely interfere with device function.  Device should be programmed:  Asynchronous pacing during procedure and returned to normal programming after procedure  Due to Pt being placed in prone position and because she is dependent on her pacemaker would recommend above.  Per Device Clinic Standing Orders, Wiliam Ke, RN  4:31 PM 04/20/2023

## 2023-04-20 NOTE — Progress Notes (Signed)
Spoke with Erie Noe at Dr. Yetta Barre' office about OR posting and consent not specifying procedure. She said that she will fix that

## 2023-04-23 ENCOUNTER — Encounter (HOSPITAL_COMMUNITY)
Admission: RE | Admit: 2023-04-23 | Discharge: 2023-04-23 | Disposition: A | Discharge status: Home / Self Care | Payer: Medicare HMO | Source: Ambulatory Visit | Attending: Neurological Surgery | Admitting: Neurological Surgery

## 2023-04-23 ENCOUNTER — Encounter (HOSPITAL_COMMUNITY): Payer: Self-pay

## 2023-04-23 ENCOUNTER — Other Ambulatory Visit: Payer: Self-pay

## 2023-04-23 VITALS — BP 157/74 | HR 69 | Temp 98.0°F | Resp 16 | Ht 62.0 in | Wt 184.6 lb

## 2023-04-23 DIAGNOSIS — I10 Essential (primary) hypertension: Secondary | ICD-10-CM | POA: Diagnosis not present

## 2023-04-23 DIAGNOSIS — Z01812 Encounter for preprocedural laboratory examination: Secondary | ICD-10-CM | POA: Diagnosis present

## 2023-04-23 DIAGNOSIS — M48061 Spinal stenosis, lumbar region without neurogenic claudication: Secondary | ICD-10-CM | POA: Insufficient documentation

## 2023-04-23 DIAGNOSIS — I4891 Unspecified atrial fibrillation: Secondary | ICD-10-CM | POA: Diagnosis not present

## 2023-04-23 DIAGNOSIS — Z950 Presence of cardiac pacemaker: Secondary | ICD-10-CM | POA: Diagnosis not present

## 2023-04-23 DIAGNOSIS — Z01818 Encounter for other preprocedural examination: Secondary | ICD-10-CM

## 2023-04-23 HISTORY — DX: Unspecified atrial fibrillation: I48.91

## 2023-04-23 LAB — BASIC METABOLIC PANEL
Anion gap: 11 (ref 5–15)
BUN: 17 mg/dL (ref 8–23)
CO2: 29 mmol/L (ref 22–32)
Calcium: 9.3 mg/dL (ref 8.9–10.3)
Chloride: 100 mmol/L (ref 98–111)
Creatinine, Ser: 0.8 mg/dL (ref 0.44–1.00)
GFR, Estimated: >60 mL/min (ref >60)
Glucose, Bld: 99 mg/dL (ref 70–99)
Potassium: 3.8 mmol/L (ref 3.5–5.1)
Sodium: 140 mmol/L (ref 135–145)

## 2023-04-23 LAB — CBC
HCT: 38.2 % (ref 36.0–46.0)
Hemoglobin: 12.3 g/dL (ref 12.0–15.0)
MCH: 30.9 pg (ref 26.0–34.0)
MCHC: 32.2 g/dL (ref 30.0–36.0)
MCV: 96 fL (ref 80.0–100.0)
Platelets: 170 10*3/uL (ref 150–400)
RBC: 3.98 MIL/uL (ref 3.87–5.11)
RDW: 13.3 % (ref 11.5–15.5)
WBC: 7 10*3/uL (ref 4.0–10.5)
nRBC: 0 % (ref 0.0–0.2)

## 2023-04-23 LAB — SURGICAL PCR SCREEN
MRSA, PCR: NEGATIVE
Staphylococcus aureus: NEGATIVE

## 2023-04-23 NOTE — Progress Notes (Addendum)
PCP - Abbey Chatters, NP Cardiologist - Dr. Peter Swaziland EP: Dr. Ladona Ridgel  PPM/ICD - St. Jude pacemaker Device Orders - received Rep Notified -  notified and confirmed that a rep will be present on day of surgery  Chest x-ray - 09/19/20 EKG - 02/20/23 Stress Test - 10/27/16 ECHO - 09/30/20 Cardiac Cath - denies  Sleep Study - denies   DM- denies  Blood Thinner Instructions: Hold Eliquis 3 days. Last dose 04/20/23 Aspirin Instructions: n/a  ERAS Protcol - no, NPO   COVID TEST- n/a   Anesthesia review: yes, cardiac hx. Pt has St Jude Pacemaker  Patient denies shortness of breath, fever, cough and chest pain at PAT appointment   All instructions explained to the patient, with a verbal understanding of the material. Patient agrees to go over the instructions while at home for a better understanding.  The opportunity to ask questions was provided.

## 2023-04-24 ENCOUNTER — Encounter (HOSPITAL_COMMUNITY): Payer: Self-pay | Admitting: Vascular Surgery

## 2023-04-24 NOTE — Anesthesia Preprocedure Evaluation (Signed)
Anesthesia Evaluation    Airway        Dental   Pulmonary           Cardiovascular hypertension,      Neuro/Psych    GI/Hepatic   Endo/Other    Renal/GU      Musculoskeletal   Abdominal   Peds  Hematology   Anesthesia Other Findings   Reproductive/Obstetrics                             Anesthesia Physical Anesthesia Plan  ASA:   Anesthesia Plan:    Post-op Pain Management:    Induction:   PONV Risk Score and Plan:   Airway Management Planned:   Additional Equipment:   Intra-op Plan:   Post-operative Plan:   Informed Consent:   Plan Discussed with:   Anesthesia Plan Comments: (PAT note written 04/24/2023 by Bryanah Sidell, PA-C.  )       Anesthesia Quick Evaluation  

## 2023-04-24 NOTE — Progress Notes (Signed)
Anesthesia Chart Review:   Case: [id number] Date/Time: 04/30/23 0716   Procedure: Bilateral - - L3-L4 - L4-L5 Decompressive laminectomy (Bilateral: Back) - 3C   Anesthesia type: General   Pre-op diagnosis: Stenosis   [facility] / [facility]   Surgeons: Tia Alert, MD       DISCUSSION: Patient is a 89 year old female scheduled for the above procedure.  History includes never smoker, HTN, afib/flutter (s/p unsuccessful aflutter DCCV 04/26/16; afib DCCV 12/04/22), PPM (St. Jude 06/21/16), reflux esophagitis, diaphragmatic hernia, osteoarthritis (left TKA 05/16/04; right TKA 07/04/13), spinal surgery (L4-5 laminectomy 06/12/19).  Last cardiology visit with Dr. Swaziland was on 04/04/23 for follow-up SB and afib/flutter. By last PPM interrogation, "Afib burden of 17% with good rate control."  She has been symptomatic in the past with persistent afib. If burden increases may consider alternative AAD therapy. In regards to surgery he wrote, "[name] was last seen on 04/04/2023 by Dr. Swaziland.  Per Dr. Elvis Coil note: "She is cleared to proceed with planned lumbar laminectomy. May hold Eliquis for 3 days prior." She reported last Eliquis reported as 04/20/23.   EP perioperative PPM recommendations: Device Information: Clinic EP Physician:  Lewayne Bunting, MD  Device Type:  Pacemaker Manufacturer and Phone #:  St. Jude/Abbott: [phone] Pacemaker Dependent?:  Yes.   Date of Last Device Check:  03/20/2023       Normal Device Function?:  Yes.     Electrophysiologist's Recommendations:  Have magnet available. Provide continuous ECG monitoring when magnet is used or reprogramming is to be performed.  Procedure will likely interfere with device function.  Device should be programmed:  Asynchronous pacing during procedure and returned to normal programming after procedure   Due to Pt being placed in prone position and because she is dependent on her pacemaker would recommend above.    Anesthesia team to evaluate on the day of surgery.   VS: BP (!) 157/74   Pulse 69   Temp 36.7 C   Resp 16   Ht 5\' 2"  (1.575 m)   Wt 83.7 kg   SpO2 99%   BMI 33.76 kg/m   PROVIDERS: Sharon Seller, NP is PCP  Swaziland, Peter, MD is cardiologist Lewayne Bunting, MD is EP cardiologist. Last visit 02/20/23 Darlen Round, Luster Landsberg, PA-C.   LABS: Labs reviewed: Acceptable for surgery. (all labs ordered are listed, but only abnormal results are displayed)  Labs Reviewed  SURGICAL PCR SCREEN  BASIC METABOLIC PANEL  CBC     IMAGES: CT L-spine 10/23/22: IMPRESSION: 1. 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 Correlation with radiographs is recommended prior to any operative intervention. 2. Progressive multilevel lumbar spondylosis as described above. New superiorly migrating right subarticular and foraminal disc protrusion at L4-L5 with impingement of the right L4 nerve root. Progressive mild-to-moderate spinal canal stenosis at this level. 3. Progressive moderate right lateral recess stenosis at L3-L4. 4. Interval posterior decompression at L5-S1 with resolved spinal canal stenosis. 5.  Aortic Atherosclerosis (ICD10-I70.0).   EKG: 02/20/23: Electronic atrial pacemaker. LAD. Non-specific ST/T wave abnormality.   CV: ETT 10/27/16: Blood pressure demonstrated a normal response to exercise. There was no ST segment deviation noted during stress. Poor exercise tolerance, 1 minutes and 37 seconds-exercise stopped probably because of atrial fibrillation with rapid ventricular response. Baseline ECG appears to be sinus rhythm with first-degree AV block however there are various pre-exercise EKGs which show potentially underlying atrial tachycardia. Exercise induced atrial fibrillation. No  electrocardiographic evidence of ischemia.   Nuclear stress test 04/21/16: The left ventricular ejection fraction is mildly decreased (45-54%). Nuclear stress EF:  51%. There was no ST segment deviation noted during stress. The study is normal. This is a low risk study. Low risk stress nuclear study with normal perfusion and borderline left ventricular global systolic function.  Consider alternative imaging for confirmation of LVEF, which may be underestimated on current study,   Echo 09/30/20: IMPRESSIONS   1. Left ventricular ejection fraction, by estimation, is 55 to 60%. The  left ventricle has normal function. The left ventricle has no regional  wall motion abnormalities. There is mild left ventricular hypertrophy.  Left ventricular diastolic parameters  are consistent with Grade I diastolic dysfunction (impaired relaxation).  The E/e' is 11.   2. Right ventricular systolic function is normal. The right ventricular  size is normal.   3. Left atrial size was moderately dilated.   4. The mitral valve is grossly normal. No evidence of mitral valve  regurgitation.   5. The aortic valve is tricuspid. There is mild calcification of the  aortic valve. Aortic valve regurgitation is not visualized.   6. The inferior vena cava is normal in size with greater than 50%  respiratory variability, suggesting right atrial pressure of 3 mmHg.  - Comparison(s): No significant change from prior study.    Past Medical History:  Diagnosis Date   A-fib [facility])    Allergic rhinitis due to pollen    Anxiety state, unspecified    Sitautional- pain   Arthritis    "right knee" (06/21/2016)   Asymptomatic varicose veins    Carpal tunnel syndrome    Chest pain, unspecified    Chronic lower back pain    "on the left side" (06/21/2016)   Constipation    Cramp of limb    Depressive disorder, not elsewhere classified    pt denies   Diaphragmatic hernia without mention of obstruction or gangrene    Diaphragmatic hernia without mention of obstruction or gangrene    Disturbance of skin sensation    Diverticulosis of colon (without mention of hemorrhage)     Dysrhythmia    Atrial Flutter   Enthesopathy of hip region    Esophageal reflux    , just occasional   History of blood transfusion    "w/both knee replacements"   History of duodenal ulcer    History of kidney stones    passed   Insomnia, unspecified    Lumbago    Migraine    "none since ~ 1990" (06/21/2016)   Myalgia and myositis, unspecified    Obesity, unspecified    Other abnormal blood chemistry    Other dysphagia    Other malaise and fatigue    Other nonspecific abnormal serum enzyme levels    Other specified cardiac dysrhythmias(427.89)    Pacemaker    Pain in joint, ankle and foot    Pain in joint, hand    Pain in joint, lower leg    Pain in joint, pelvic region and thigh    Pain in limb    Plantar fascial fibromatosis    Presence of permanent cardiac pacemaker    -St Jude   Reflux esophagitis    Sciatica    Spinal stenosis, unspecified region other than cervical    Stricture and stenosis of esophagus    Symptomatic menopausal or female climacteric states    Unspecified essential hypertension    Unspecified essential hypertension  Unspecified vitamin D deficiency     Past Surgical History:  Procedure Laterality Date   BREAST BIOPSY Left 1990s X 2   CARDIOVERSION N/A 04/26/2016   Procedure: CARDIOVERSION;  Surgeon: Chrystie Nose, MD;  Location: [facility] ENDOSCOPY;  Service: Cardiovascular;  Laterality: N/A;   CARDIOVERSION N/A 12/04/2022   Procedure: CARDIOVERSION;  Surgeon: Parke Poisson, MD;  Location: [facility] ENDOSCOPY;  Service: Cardiovascular;  Laterality: N/A;   CATARACT EXTRACTION W/ INTRAOCULAR LENS IMPLANT Left 08/03/1999   DR EPES    CATARACT EXTRACTION W/ INTRAOCULAR LENS IMPLANT Right 2000   DR EPES   CHOLECYSTECTOMY OPEN  1989   DR BOWMAN   COLONOSCOPY  1988   Normal    DENTAL SURGERY Left 08/2016   EP IMPLANTABLE DEVICE N/A 06/21/2016   Procedure: Pacemaker Implant;  Surgeon: Marinus Maw, MD;  Location: [facility] INVASIVE CV LAB;  Service:  Cardiovascular;  Laterality: N/A;   ESOPHAGOGASTRODUODENOSCOPY (EGD) WITH ESOPHAGEAL DILATION  ~ 1982   Dr. Jarold Motto   EXCISION OF ACTINIC KERATOSIS     DR LUPTON    INSERT / REPLACE / REMOVE PACEMAKER  06/21/2016   St Jude   KNEE ARTHROSCOPY Left 2003   KNEE ARTHROSCOPY Right 06/26/2013   KNEE CLOSED REDUCTION Right 10/23/2013   Procedure: CLOSED MANIPULATION RIGHT KNEE;  Surgeon: Kathryne Hitch, MD;  Location: [facility] OR;  Service: Orthopedics;  Laterality: Right;   LASER FOR CLOUDY CAP LEFT EYE Left 03/2006   DR DIGBY   LUMBAR LAMINECTOMY/DECOMPRESSION MICRODISCECTOMY N/A 06/12/2019   Procedure: Laminectomy and Foraminotomy - Lumbar four-Lumbar five;  Surgeon: Tia Alert, MD;  Location: [facility] OR;  Service: Neurosurgery;  Laterality: N/A;   SKIN SURGERY  04/2022   [facility] Dermatology, Dr.Gray. Removed area on upper chest   TOTAL KNEE ARTHROPLASTY Left 04/2004   DR RENDALL   TOTAL KNEE ARTHROPLASTY Right 07/04/2013   Procedure: RIGHT TOTAL KNEE ARTHROPLASTY;  Surgeon: Kathryne Hitch, MD;  Location: [facility] ORS;  Service: Orthopedics;  Laterality: Right;   TRIGGER FINGER RELEASE Right 09/2018   VAGINAL HYSTERECTOMY  1979    MEDICATIONS:  acetaminophen (TYLENOL) 500 MG tablet   antiseptic oral rinse (BIOTENE) LIQD   Artificial Saliva (ACT DRY MOUTH) LOZG   bimatoprost (LUMIGAN) 0.01 % SOLN   Calcium Citrate-Vitamin D (CALCIUM + D PO)   Capsaicin (ASPERCREME PAIN RELIEF PATCH EX)   cyanocobalamin (VITAMIN B12) 1000 MCG tablet   Dentifrices (BIOTENE DRY MOUTH GENTLE) PSTE   diazepam (VALIUM) 5 MG tablet   Docusate Calcium (STOOL SOFTENER PO)   dorzolamide (TRUSOPT) 2 % ophthalmic solution   ELIQUIS 5 MG TABS tablet   esomeprazole (NEXIUM) 20 MG capsule   ezetimibe (ZETIA) 10 MG tablet   Glycerin, Laxative, (ADULT SUPPOSITORY RE)   loratadine (CLARITIN) 10 MG tablet   methylcellulose (ARTIFICIAL TEARS) 1 % ophthalmic solution   metoprolol tartrate (LOPRESSOR) 25 MG  tablet   OVER THE COUNTER MEDICATION   predniSONE (DELTASONE) 10 MG tablet   propafenone (RYTHMOL SR) 225 MG 12 hr capsule   sodium chloride (OCEAN) 0.65 % SOLN nasal spray   traMADol (ULTRAM) 50 MG tablet   traZODone (DESYREL) 50 MG tablet   valsartan-hydrochlorothiazide (DIOVAN-HCT) 320-25 MG tablet   No current facility-administered medications for this encounter.  Prednisone 10 mg daily prescribed by Doylene Bode, NP on 04/20/23.    Shonna Chock, PA-C Surgical Short Stay/Anesthesiology [facility] Phone [phone] [facility] Phone [phone] 04/24/2023 2:51 PM

## 2023-04-30 ENCOUNTER — Ambulatory Visit (HOSPITAL_COMMUNITY): Admission: RE | Admit: 2023-04-30 | Payer: Medicare HMO | Source: Home / Self Care | Admitting: Neurological Surgery

## 2023-04-30 SURGERY — LUMBAR LAMINECTOMY/DECOMPRESSION MICRODISCECTOMY 2 LEVELS
Anesthesia: General | Site: Back | Laterality: Bilateral

## 2023-05-01 DIAGNOSIS — M545 Low back pain, unspecified: Secondary | ICD-10-CM | POA: Diagnosis not present

## 2023-05-01 DIAGNOSIS — M5416 Radiculopathy, lumbar region: Secondary | ICD-10-CM | POA: Diagnosis not present

## 2023-05-01 NOTE — Progress Notes (Signed)
Remote pacemaker transmission.   

## 2023-05-03 DIAGNOSIS — M545 Low back pain, unspecified: Secondary | ICD-10-CM | POA: Diagnosis not present

## 2023-05-03 DIAGNOSIS — M5416 Radiculopathy, lumbar region: Secondary | ICD-10-CM | POA: Diagnosis not present

## 2023-05-07 DIAGNOSIS — M5416 Radiculopathy, lumbar region: Secondary | ICD-10-CM | POA: Diagnosis not present

## 2023-05-07 DIAGNOSIS — M545 Low back pain, unspecified: Secondary | ICD-10-CM | POA: Diagnosis not present

## 2023-05-09 DIAGNOSIS — M5416 Radiculopathy, lumbar region: Secondary | ICD-10-CM | POA: Diagnosis not present

## 2023-05-09 DIAGNOSIS — M545 Low back pain, unspecified: Secondary | ICD-10-CM | POA: Diagnosis not present

## 2023-05-15 DIAGNOSIS — M5416 Radiculopathy, lumbar region: Secondary | ICD-10-CM | POA: Diagnosis not present

## 2023-05-15 DIAGNOSIS — M545 Low back pain, unspecified: Secondary | ICD-10-CM | POA: Diagnosis not present

## 2023-05-17 DIAGNOSIS — M5416 Radiculopathy, lumbar region: Secondary | ICD-10-CM | POA: Diagnosis not present

## 2023-05-17 DIAGNOSIS — M545 Low back pain, unspecified: Secondary | ICD-10-CM | POA: Diagnosis not present

## 2023-05-24 ENCOUNTER — Ambulatory Visit: Payer: Medicare HMO | Attending: Internal Medicine | Admitting: Internal Medicine

## 2023-05-24 ENCOUNTER — Encounter: Payer: Self-pay | Admitting: Internal Medicine

## 2023-05-24 VITALS — BP 142/80 | HR 66 | Ht 62.0 in | Wt 184.0 lb

## 2023-05-24 DIAGNOSIS — I495 Sick sinus syndrome: Secondary | ICD-10-CM | POA: Diagnosis not present

## 2023-05-24 DIAGNOSIS — I4891 Unspecified atrial fibrillation: Secondary | ICD-10-CM | POA: Diagnosis not present

## 2023-05-24 DIAGNOSIS — Z950 Presence of cardiac pacemaker: Secondary | ICD-10-CM | POA: Diagnosis not present

## 2023-05-24 NOTE — Progress Notes (Signed)
HPI [name] returns today for followup. She is a pleasant 89 yo woman with a h/o sinus node dysfunction and atrial fib with a RVR,s /p PPM insertion. She has been treated with propafenone and a beta blocker. Since her last visit, she has done well except she is in need for more back surgery. She had developed atrial fib and I expected she would need a different AA drug. However she has done well in the interim. She is pending more surgery. She feels well.  Allergies  Allergen Reactions   Advil [Ibuprofen] Other (See Comments)    GI upset   Aleve [Naproxen Sodium] Other (See Comments)    GI upset   Aspirin Nausea And Vomiting    Takes baby aspirin qod without problems   Atorvastatin Other (See Comments)    Leg cramps   Celebrex [Celecoxib] Other (See Comments)    GI upset   Diclofenac     GI upset   Doxycycline Other (See Comments)    Headache   Nsaids Other (See Comments)    Tears my stomach up    Other     Antihistamine - increase BP   Pravastatin Other (See Comments)    Leg cramps   Timolol Other (See Comments)    Dropped HR   Metoclopramide Hcl Palpitations     Current Outpatient Medications  Medication Sig Dispense Refill   acetaminophen (TYLENOL) 500 MG tablet Take 500-1,000 mg by mouth every 6 (six) hours as needed for moderate pain.     antiseptic oral rinse (BIOTENE) LIQD 15 mLs by Mouth Rinse route 2 (two) times daily.     Artificial Saliva (ACT DRY MOUTH) LOZG Use as directed 1 drop in the mouth or throat as needed (dry mouth).     bimatoprost (LUMIGAN) 0.01 % SOLN Place 1 drop into both eyes at bedtime.      Calcium Citrate-Vitamin D (CALCIUM + D PO) Take 1 tablet by mouth in the morning.     Capsaicin (ASPERCREME PAIN RELIEF PATCH EX) Place 1 patch onto the skin daily as needed (pain.).     cyanocobalamin (VITAMIN B12) 1000 MCG tablet Take 1,000 mcg by mouth in the morning.     Dentifrices (BIOTENE DRY MOUTH GENTLE) PSTE Place 1 Application onto teeth  in the morning and at bedtime.     diazepam (VALIUM) 5 MG tablet Take 1 by mouth 1 hour  pre-procedure with very light food. May bring 2nd tablet to appointment. 2 tablet 0   Docusate Calcium (STOOL SOFTENER PO) Take 100 mg by mouth 3 (three) times a week. At night     dorzolamide (TRUSOPT) 2 % ophthalmic solution Place 1 drop into both eyes 3 (three) times daily.     ELIQUIS 5 MG TABS tablet TAKE 1 TABLET BY MOUTH TWICE A DAY 180 tablet 1   esomeprazole (NEXIUM) 20 MG capsule TAKE 1 CAPSULE (20 MG TOTAL) BY MOUTH AS NEEDED. (Patient taking differently: Take 20 mg by mouth daily as needed (acid reflux/indigestion.).) 90 capsule 1   ezetimibe (ZETIA) 10 MG tablet TAKE ONE TABLET BY MOUTH ONCE DAILY 90 tablet 3   Glycerin, Laxative, (ADULT SUPPOSITORY RE) Place 1 suppository rectally daily as needed (constipation.).     loratadine (CLARITIN) 10 MG tablet Take 10 mg by mouth daily as needed for allergies.     methylcellulose (ARTIFICIAL TEARS) 1 % ophthalmic solution Place 1 drop into both eyes 3 (three) times daily as needed (for  dry eyes).     metoprolol tartrate (LOPRESSOR) 25 MG tablet TAKE 1 TABLET BY MOUTH TWICE A DAY 180 tablet 3   OVER THE COUNTER MEDICATION Take 1 capsule by mouth 2 (two) times daily. HydroEye Softgels Dry Eye Relief     predniSONE (DELTASONE) 10 MG tablet TAKE 1 TABLET (10 MG TOTAL) BY MOUTH DAILY WITH BREAKFAST. 30 tablet 0   propafenone (RYTHMOL SR) 225 MG 12 hr capsule TAKE 1 CAPSULE BY MOUTH TWICE A DAY 180 capsule 2   sodium chloride (OCEAN) 0.65 % SOLN nasal spray Place 1 spray into both nostrils 2 (two) times daily as needed (dry nasal passages). [employer] Saline Nasal Moisturizing Spray     traMADol (ULTRAM) 50 MG tablet Take 50 mg by mouth every 6 (six) hours as needed (pain.).     traZODone (DESYREL) 50 MG tablet TAKE 1 TABLET BY MOUTH EVERYDAY AT BEDTIME (Patient taking differently: Take 25 mg by mouth at bedtime as needed for sleep.) 90 tablet 1    valsartan-hydrochlorothiazide (DIOVAN-HCT) 320-25 MG tablet TAKE ONE TABLET BY MOUTH DAILY 90 tablet 1   No current facility-administered medications for this visit.     Past Medical History:  Diagnosis Date   A-fib [id number])    Allergic rhinitis due to pollen    Anxiety state, unspecified    Sitautional- pain   Arthritis    "right knee" (06/21/2016)   Asymptomatic varicose veins    Carpal tunnel syndrome    Chest pain, unspecified    Chronic lower back pain    "on the left side" (06/21/2016)   Constipation    Cramp of limb    Depressive disorder, not elsewhere classified    pt denies   Diaphragmatic hernia without mention of obstruction or gangrene    Diaphragmatic hernia without mention of obstruction or gangrene    Disturbance of skin sensation    Diverticulosis of colon (without mention of hemorrhage)    Dysrhythmia    Atrial Flutter   Enthesopathy of hip region    Esophageal reflux    , just occasional   History of blood transfusion    "w/both knee replacements"   History of duodenal ulcer    History of kidney stones    passed   Insomnia, unspecified    Lumbago    Migraine    "none since ~ 1990" (06/21/2016)   Myalgia and myositis, unspecified    Obesity, unspecified    Other abnormal blood chemistry    Other dysphagia    Other malaise and fatigue    Other nonspecific abnormal serum enzyme levels    Other specified cardiac dysrhythmias(427.89)    Pacemaker    Pain in joint, ankle and foot    Pain in joint, hand    Pain in joint, lower leg    Pain in joint, pelvic region and thigh    Pain in limb    Plantar fascial fibromatosis    Presence of permanent cardiac pacemaker    -St Jude   Reflux esophagitis    Sciatica    Spinal stenosis, unspecified region other than cervical    Stricture and stenosis of esophagus    Symptomatic menopausal or female climacteric states    Unspecified essential hypertension    Unspecified essential hypertension    Unspecified  vitamin D deficiency     ROS:   All systems reviewed and negative except as noted in the HPI.   Past Surgical History:  Procedure Laterality Date  BREAST BIOPSY Left 1990s X 2   CARDIOVERSION N/A 04/26/2016   Procedure: CARDIOVERSION;  Surgeon: Chrystie Nose, MD;  Location: [facility] ENDOSCOPY;  Service: Cardiovascular;  Laterality: N/A;   CARDIOVERSION N/A 12/04/2022   Procedure: CARDIOVERSION;  Surgeon: Parke Poisson, MD;  Location: [facility] ENDOSCOPY;  Service: Cardiovascular;  Laterality: N/A;   CATARACT EXTRACTION W/ INTRAOCULAR LENS IMPLANT Left 08/03/1999   DR EPES    CATARACT EXTRACTION W/ INTRAOCULAR LENS IMPLANT Right 2000   DR EPES   CHOLECYSTECTOMY OPEN  1989   DR BOWMAN   COLONOSCOPY  1988   Normal    DENTAL SURGERY Left 08/2016   EP IMPLANTABLE DEVICE N/A 06/21/2016   Procedure: Pacemaker Implant;  Surgeon: Marinus Maw, MD;  Location: [facility] INVASIVE CV LAB;  Service: Cardiovascular;  Laterality: N/A;   ESOPHAGOGASTRODUODENOSCOPY (EGD) WITH ESOPHAGEAL DILATION  ~ 1982   Dr. Jarold Motto   EXCISION OF ACTINIC KERATOSIS     DR LUPTON    INSERT / REPLACE / REMOVE PACEMAKER  06/21/2016   St Jude   KNEE ARTHROSCOPY Left 2003   KNEE ARTHROSCOPY Right 06/26/2013   KNEE CLOSED REDUCTION Right 10/23/2013   Procedure: CLOSED MANIPULATION RIGHT KNEE;  Surgeon: Kathryne Hitch, MD;  Location: [facility] OR;  Service: Orthopedics;  Laterality: Right;   LASER FOR CLOUDY CAP LEFT EYE Left 03/2006   DR DIGBY   LUMBAR LAMINECTOMY/DECOMPRESSION MICRODISCECTOMY N/A 06/12/2019   Procedure: Laminectomy and Foraminotomy - Lumbar four-Lumbar five;  Surgeon: Tia Alert, MD;  Location: [facility] OR;  Service: Neurosurgery;  Laterality: N/A;   SKIN SURGERY  04/2022   [facility] Dermatology, Dr.Gray. Removed area on upper chest   TOTAL KNEE ARTHROPLASTY Left 04/2004   DR RENDALL   TOTAL KNEE ARTHROPLASTY Right 07/04/2013   Procedure: RIGHT TOTAL KNEE ARTHROPLASTY;  Surgeon: Kathryne Hitch, MD;   Location: [facility] ORS;  Service: Orthopedics;  Laterality: Right;   TRIGGER FINGER RELEASE Right 09/2018   VAGINAL HYSTERECTOMY  1979     Family History  Problem Relation Age of Onset   Ovarian cancer Mother    Uterine cancer Mother    Cerebrovascular Accident Mother    Heart disease Father    Hypertension Brother    Obesity Daughter      Social History   Socioeconomic History   Marital status: Married    Spouse name: Not on file   Number of children: 1   Years of education: Not on file   Highest education level: Not on file  Occupational History   Not on file  Tobacco Use   Smoking status: Never   Smokeless tobacco: Never  Vaping Use   Vaping Use: Never used  Substance and Sexual Activity   Alcohol use: No   Drug use: No   Sexual activity: Yes  Other Topics Concern   Not on file  Social History Narrative   Not on file   Social Determinants of Health   Financial Resource Strain: Low Risk  (11/21/2017)   Overall Financial Resource Strain (CARDIA)    Difficulty of Paying Living Expenses: Not hard at all  Food Insecurity: No Food Insecurity (11/21/2017)   Hunger Vital Sign    Worried About Running Out of Food in the Last Year: Never true    Ran Out of Food in the Last Year: Never true  Transportation Needs: No Transportation Needs (11/21/2017)   PRAPARE - Administrator, Civil Service (Medical): No    Lack of Transportation (  Non-Medical): No  Physical Activity: Sufficiently Active (11/21/2017)   Exercise Vital Sign    Days of Exercise per Week: 5 days    Minutes of Exercise per Session: 30 min  Stress: Stress Concern Present (11/21/2017)   [employer] of Occupational Health - Occupational Stress Questionnaire    Feeling of Stress : To some extent  Social Connections: Socially Integrated (11/21/2017)   Social Connection and Isolation Panel [NHANES]    Frequency of Communication with Friends and Family: Once a week    Frequency of Social  Gatherings with Friends and Family: More than three times a week    Attends Religious Services: 1 to 4 times per year    Active Member of [employer] or Organizations: Yes    Attends Banker Meetings: 1 to 4 times per year    Marital Status: Married  Catering manager Violence: Not At Risk (11/21/2017)   Humiliation, Afraid, Rape, and Kick questionnaire    Fear of Current or Ex-Partner: No    Emotionally Abused: No    Physically Abused: No    Sexually Abused: No     BP (!) 142/80   Pulse 66   Ht 5\' 2"  (1.575 m)   Wt 184 lb (83.5 kg)   SpO2 96%   BMI 33.65 kg/m   Physical Exam:  Well appearing NAD HEENT: Unremarkable Neck:  No JVD, no thyromegally Lymphatics:  No adenopathy Back:  No CVA tenderness Lungs:  Clear HEART:  Regular rate rhythm, no murmurs, no rubs, no clicks Abd:  soft, positive bowel sounds, no organomegally, no rebound, no guarding Ext:  2 plus pulses, no edema, no cyanosis, no clubbing Skin:  No rashes no nodules Neuro:  CN II through XII intact, motor grossly intact  EKG NSR with atrial pacing  DEVICE  Normal device function.  See PaceArt for details.   Assess/Plan: 1. PAF - she has reverted back to atrial fib. She is minimally symptomatic. We discussed the treatment options. If she worsens she will need to either start dofetilide or amio. I asked her to call us if this occurs. 2. HTN - her bp is ok. I suspect that her fatigue is related to her metoprolol and have asked her to continue 25mg  bid. 3. Sinus node dysfunction - she is pacing in the atrium over 99% of the time. 4. PPM - her St. Jude DDD PM is working normally. Her rates are improved.  5. Preop eval - she is low risk for cardiac complications from pending back surgery. She may proceed. She can stop her blood thinner 3 days prior to surgery. She can restart when the bleeding risk is acceptable.   Sharlot Gowda Barry Faircloth,MD

## 2023-05-24 NOTE — Patient Instructions (Signed)
Medication Instructions:  Your physician recommends that you continue on your current medications as directed. Please refer to the Current Medication list given to you today.  *If you need a refill on your cardiac medications before your next appointment, please call your pharmacy*  Lab Work: None ordered.  If you have labs (blood work) drawn today and your tests are completely normal, you will receive your results only by: MyChart Message (if you have MyChart) OR A paper copy in the mail If you have any lab test that is abnormal or we need to change your treatment, we will call you to review the results.  Testing/Procedures: None ordered.  Follow-Up: At [facility], you and your health needs are our priority.  As part of our continuing mission to provide you with exceptional heart care, we have created designated Provider Care Teams.  These Care Teams include your primary Cardiologist (physician) and Advanced Practice Providers (APPs -  Physician Assistants and Nurse Practitioners) who all work together to provide you with the care you need, when you need it.  We recommend signing up for the patient portal called "MyChart".  Sign up information is provided on this After Visit Summary.  MyChart is used to connect with patients for Virtual Visits (Telemedicine).  Patients are able to view lab/test results, encounter notes, upcoming appointments, etc.  Non-urgent messages can be sent to your provider as well.   To learn more about what you can do with MyChart, go to [email or web address].    Your next appointment:   1 year(s)  The format for your next appointment:   In Person  Provider:   Lewayne Bunting, MD{or one of the following Advanced Practice Providers on your designated Care Team:   Francis Dowse, [place] Casimiro Needle "Mardelle Matte" Lanna Poche, [place]  Remote monitoring is used to monitor your Pacemaker from home. This monitoring reduces the number of office visits required to check your device to  one time per year. It allows [place] to keep an eye on the functioning of your device to ensure it is working properly. You are scheduled for a device check from home on 6/25. You may send your transmission at any time that day. If you have a wireless device, the transmission will be sent automatically. After your physician reviews your transmission, you will receive a postcard with your next transmission date.

## 2023-05-28 DIAGNOSIS — M545 Low back pain, unspecified: Secondary | ICD-10-CM | POA: Diagnosis not present

## 2023-05-28 DIAGNOSIS — M5416 Radiculopathy, lumbar region: Secondary | ICD-10-CM | POA: Diagnosis not present

## 2023-05-30 DIAGNOSIS — M5416 Radiculopathy, lumbar region: Secondary | ICD-10-CM | POA: Diagnosis not present

## 2023-05-30 DIAGNOSIS — M545 Low back pain, unspecified: Secondary | ICD-10-CM | POA: Diagnosis not present

## 2023-06-07 DIAGNOSIS — M5416 Radiculopathy, lumbar region: Secondary | ICD-10-CM | POA: Diagnosis not present

## 2023-06-07 DIAGNOSIS — Z6829 Body mass index (BMI) 29.0-29.9, adult: Secondary | ICD-10-CM | POA: Diagnosis not present

## 2023-06-15 ENCOUNTER — Ambulatory Visit (INDEPENDENT_AMBULATORY_CARE_PROVIDER_SITE_OTHER): Payer: Medicare HMO | Admitting: Nurse Practitioner

## 2023-06-15 ENCOUNTER — Encounter: Payer: Self-pay | Admitting: Nurse Practitioner

## 2023-06-15 VITALS — BP 124/72 | HR 64 | Temp 97.1°F | Ht 62.0 in | Wt 184.0 lb

## 2023-06-15 DIAGNOSIS — E785 Hyperlipidemia, unspecified: Secondary | ICD-10-CM | POA: Diagnosis not present

## 2023-06-15 DIAGNOSIS — I480 Paroxysmal atrial fibrillation: Secondary | ICD-10-CM

## 2023-06-15 DIAGNOSIS — Z66 Do not resuscitate: Secondary | ICD-10-CM

## 2023-06-15 DIAGNOSIS — L814 Other melanin hyperpigmentation: Secondary | ICD-10-CM

## 2023-06-15 DIAGNOSIS — M810 Age-related osteoporosis without current pathological fracture: Secondary | ICD-10-CM | POA: Diagnosis not present

## 2023-06-15 DIAGNOSIS — I10 Essential (primary) hypertension: Secondary | ICD-10-CM | POA: Diagnosis not present

## 2023-06-15 DIAGNOSIS — J302 Other seasonal allergic rhinitis: Secondary | ICD-10-CM

## 2023-06-15 DIAGNOSIS — F5101 Primary insomnia: Secondary | ICD-10-CM | POA: Diagnosis not present

## 2023-06-15 DIAGNOSIS — M48062 Spinal stenosis, lumbar region with neurogenic claudication: Secondary | ICD-10-CM

## 2023-06-15 MED ORDER — HYDROQUINONE 4 % EX CREA
TOPICAL_CREAM | Freq: Every day | CUTANEOUS | 0 refills | Status: DC
Start: 2023-06-15 — End: 2024-07-04

## 2023-06-15 NOTE — Progress Notes (Signed)
Careteam: Patient Care Team: Sharon Seller, NP as PCP - General (Geriatric Medicine) [place], Peter M, MD as PCP - Cardiology (Cardiology) Kathryne Hitch, MD as Attending Physician (Orthopedic Surgery) Laurey Morale, MD as Attending Physician (Cardiology) Keturah Barre, MD as Consulting Physician (Otolaryngology) Cherlyn Roberts, MD as Consulting Physician (Dermatology) Rennis Golden Lisette Abu, MD as Consulting Physician (Cardiology) Arman Bogus, MD as Consulting Physician (Neurosurgery) Shon Millet, MD as Consulting Physician (Ophthalmology)  PLACE OF SERVICE:  [facility]  Advanced Directive information Does Patient Have a Medical Advance Directive?: Yes, Type of Advance Directive: Healthcare Power of Hazel;Out of facility DNR (pink MOST or yellow form), Pre-existing out of facility DNR order (yellow form or pink MOST form): Yellow form placed in chart (order not valid for inpatient use);Pink MOST form placed in chart (order not valid for inpatient use), Does patient want to make changes to medical advance directive?: No - Patient declined  Allergies  Allergen Reactions   Advil [Ibuprofen] Other (See Comments)    GI upset   Aleve [Naproxen Sodium] Other (See Comments)    GI upset   Aspirin Nausea And Vomiting    Takes baby aspirin qod without problems   Atorvastatin Other (See Comments)    Leg cramps   Celebrex [Celecoxib] Other (See Comments)    GI upset   Diclofenac     GI upset   Doxycycline Other (See Comments)    Headache   Nsaids Other (See Comments)    Tears my stomach up    Other     Antihistamine - increase BP   Pravastatin Other (See Comments)    Leg cramps   Timolol Other (See Comments)    Dropped HR   Metoclopramide Hcl Palpitations    Chief Complaint  Patient presents with   Medical Management of Chronic Issues    6 month follow-up. Discuss need for additional covid boosters. Anticipating spinal surgery. Patient c/o  right leg pain/weakness and its affecting the left leg, which causes patient to stagger at times.      HPI: Patient is a 89 y.o. female  for routine follow up She was supposed to have surgery in may but insurance denied procedure.  She has gone to 7 weeks of PT but got worse. She has not been approved.   She has gained weight in her mid section which has made her more uncomfortable. Ready to have procedure so she can get to walking again.  Waiting to hear for when she can schedule.   Taking tramadol and tylenol. Pain goes down right leg.  She feels unstable at times.    A fib- rate controlled, on eliquis.   For her birthday she went and had hot dog and some oreos   Only uses trazodone as needed for sleep. Does not like to take daily   She does not wish to go back to dermatologist, wants to do a peel but she does not want to do it.    Review of Systems:  Review of Systems  Constitutional:  Negative for chills, fever and weight loss.  HENT:  Negative for tinnitus.   Respiratory:  Negative for cough, sputum production and shortness of breath.   Cardiovascular:  Negative for chest pain, palpitations and leg swelling.  Gastrointestinal:  Negative for abdominal pain, constipation, diarrhea and heartburn.  Genitourinary:  Negative for dysuria, frequency and urgency.  Musculoskeletal:  Positive for back pain. Negative for falls, joint pain and myalgias.  Skin: Negative.   Neurological:  Negative for dizziness and headaches.  Psychiatric/Behavioral:  Negative for depression and memory loss. The patient does not have insomnia.     Past Medical History:  Diagnosis Date   A-fib [facility])    Allergic rhinitis due to pollen    Anxiety state, unspecified    Sitautional- pain   Arthritis    "right knee" (06/21/2016)   Asymptomatic varicose veins    Carpal tunnel syndrome    Chest pain, unspecified    Chronic lower back pain    "on the left side" (06/21/2016)   Constipation    Cramp of  limb    Depressive disorder, not elsewhere classified    pt denies   Diaphragmatic hernia without mention of obstruction or gangrene    Diaphragmatic hernia without mention of obstruction or gangrene    Disturbance of skin sensation    Diverticulosis of colon (without mention of hemorrhage)    Dysrhythmia    Atrial Flutter   Enthesopathy of hip region    Esophageal reflux    , just occasional   History of blood transfusion    "w/both knee replacements"   History of duodenal ulcer    History of kidney stones    passed   Insomnia, unspecified    Lumbago    Migraine    "none since ~ 1990" (06/21/2016)   Myalgia and myositis, unspecified    Obesity, unspecified    Other abnormal blood chemistry    Other dysphagia    Other malaise and fatigue    Other nonspecific abnormal serum enzyme levels    Other specified cardiac dysrhythmias(427.89)    Pacemaker    Pain in joint, ankle and foot    Pain in joint, hand    Pain in joint, lower leg    Pain in joint, pelvic region and thigh    Pain in limb    Plantar fascial fibromatosis    Presence of permanent cardiac pacemaker    -St Jude   Reflux esophagitis    Sciatica    Spinal stenosis, unspecified region other than cervical    Stricture and stenosis of esophagus    Symptomatic menopausal or female climacteric states    Unspecified essential hypertension    Unspecified essential hypertension    Unspecified vitamin D deficiency    Past Surgical History:  Procedure Laterality Date   BREAST BIOPSY Left 1990s X 2   CARDIOVERSION N/A 04/26/2016   Procedure: CARDIOVERSION;  Surgeon: Chrystie Nose, MD;  Location: [facility] ENDOSCOPY;  Service: Cardiovascular;  Laterality: N/A;   CARDIOVERSION N/A 12/04/2022   Procedure: CARDIOVERSION;  Surgeon: Parke Poisson, MD;  Location: [facility] ENDOSCOPY;  Service: Cardiovascular;  Laterality: N/A;   CATARACT EXTRACTION W/ INTRAOCULAR LENS IMPLANT Left 08/03/1999   DR EPES    CATARACT EXTRACTION W/  INTRAOCULAR LENS IMPLANT Right 2000   DR EPES   CHOLECYSTECTOMY OPEN  1989   DR BOWMAN   COLONOSCOPY  1988   Normal    DENTAL SURGERY Left 08/2016   EP IMPLANTABLE DEVICE N/A 06/21/2016   Procedure: Pacemaker Implant;  Surgeon: Marinus Maw, MD;  Location: [facility] INVASIVE CV LAB;  Service: Cardiovascular;  Laterality: N/A;   ESOPHAGOGASTRODUODENOSCOPY (EGD) WITH ESOPHAGEAL DILATION  ~ 1982   Dr. Jarold Motto   EXCISION OF ACTINIC KERATOSIS     DR LUPTON    INSERT / REPLACE / REMOVE PACEMAKER  06/21/2016   St Jude   KNEE ARTHROSCOPY Left 2003  KNEE ARTHROSCOPY Right 06/26/2013   KNEE CLOSED REDUCTION Right 10/23/2013   Procedure: CLOSED MANIPULATION RIGHT KNEE;  Surgeon: Kathryne Hitch, MD;  Location: [facility] OR;  Service: Orthopedics;  Laterality: Right;   LASER FOR CLOUDY CAP LEFT EYE Left 03/2006   DR DIGBY   LUMBAR LAMINECTOMY/DECOMPRESSION MICRODISCECTOMY N/A 06/12/2019   Procedure: Laminectomy and Foraminotomy - Lumbar four-Lumbar five;  Surgeon: Tia Alert, MD;  Location: [facility] OR;  Service: Neurosurgery;  Laterality: N/A;   SKIN SURGERY  04/2022   [facility], Dr.Gray. Removed area on upper chest   TOTAL KNEE ARTHROPLASTY Left 04/2004   DR RENDALL   TOTAL KNEE ARTHROPLASTY Right 07/04/2013   Procedure: RIGHT TOTAL KNEE ARTHROPLASTY;  Surgeon: Kathryne Hitch, MD;  Location: [facility] ORS;  Service: Orthopedics;  Laterality: Right;   TRIGGER FINGER RELEASE Right 09/2018   VAGINAL HYSTERECTOMY  1979   Social History:   reports that she has never smoked. She has never used smokeless tobacco. She reports that she does not drink alcohol and does not use drugs.  Family History  Problem Relation Age of Onset   Ovarian cancer Mother    Uterine cancer Mother    Cerebrovascular Accident Mother    Heart disease Father    Hypertension Brother    Obesity Daughter     Medications: Patient's Medications  New Prescriptions   No medications on file  Previous Medications    ACETAMINOPHEN (TYLENOL) 500 MG TABLET    Take 500-1,000 mg by mouth every 6 (six) hours as needed for moderate pain.   ANTISEPTIC ORAL RINSE (BIOTENE) LIQD    15 mLs by Mouth Rinse route 2 (two) times daily.   ARTIFICIAL SALIVA (ACT DRY MOUTH) LOZG    Use as directed 1 drop in the mouth or throat as needed (dry mouth).   BIMATOPROST (LUMIGAN) 0.01 % SOLN    Place 1 drop into both eyes at bedtime.    CALCIUM CITRATE-VITAMIN D (CALCIUM + D PO)    Take 1 tablet by mouth in the morning.   CAPSAICIN (ASPERCREME PAIN RELIEF PATCH EX)    Place 1 patch onto the skin daily as needed (pain.).   CYANOCOBALAMIN (VITAMIN B12) 1000 MCG TABLET    Take 1,000 mcg by mouth in the morning.   DENTIFRICES (BIOTENE DRY MOUTH GENTLE) PSTE    Place 1 Application onto teeth in the morning and at bedtime.   DOCUSATE CALCIUM (STOOL SOFTENER PO)    Take 100 mg by mouth 3 (three) times a week. At night   DORZOLAMIDE (TRUSOPT) 2 % OPHTHALMIC SOLUTION    Place 1 drop into both eyes 3 (three) times daily.   ELIQUIS 5 MG TABS TABLET    TAKE 1 TABLET BY MOUTH TWICE A DAY   ESOMEPRAZOLE (NEXIUM) 20 MG CAPSULE    TAKE 1 CAPSULE (20 MG TOTAL) BY MOUTH AS NEEDED.   EZETIMIBE (ZETIA) 10 MG TABLET    TAKE ONE TABLET BY MOUTH ONCE DAILY   GLYCERIN, LAXATIVE, (ADULT SUPPOSITORY RE)    Place 1 suppository rectally daily as needed (constipation.).   LORATADINE (CLARITIN) 10 MG TABLET    Take 10 mg by mouth daily as needed for allergies.   METHYLCELLULOSE (ARTIFICIAL TEARS) 1 % OPHTHALMIC SOLUTION    Place 1 drop into both eyes 3 (three) times daily as needed (for dry eyes).   METOPROLOL TARTRATE (LOPRESSOR) 25 MG TABLET    TAKE 1 TABLET BY MOUTH TWICE A DAY   OVER THE  COUNTER MEDICATION    Take 1 capsule by mouth 2 (two) times daily. HydroEye Softgels Dry Eye Relief   PROPAFENONE (RYTHMOL SR) 225 MG 12 HR CAPSULE    TAKE 1 CAPSULE BY MOUTH TWICE A DAY   SODIUM CHLORIDE (OCEAN) 0.65 % SOLN NASAL SPRAY    Place 1 spray into both nostrils 2  (two) times daily as needed (dry nasal passages). [employer] Saline Nasal Moisturizing Spray   TRAMADOL (ULTRAM) 50 MG TABLET    Take 50 mg by mouth every 6 (six) hours as needed (pain.).   TRAZODONE (DESYREL) 50 MG TABLET    TAKE 1 TABLET BY MOUTH EVERYDAY AT BEDTIME   VALSARTAN-HYDROCHLOROTHIAZIDE (DIOVAN-HCT) 320-25 MG TABLET    TAKE ONE TABLET BY MOUTH DAILY  Modified Medications   No medications on file  Discontinued Medications   DIAZEPAM (VALIUM) 5 MG TABLET    Take 1 by mouth 1 hour  pre-procedure with very light food. May bring 2nd tablet to appointment.   PREDNISONE (DELTASONE) 10 MG TABLET    TAKE 1 TABLET (10 MG TOTAL) BY MOUTH DAILY WITH BREAKFAST.    Physical Exam:  Vitals:   06/15/23 1141  BP: 124/72  Pulse: 64  Temp: (!) 97.1 F (36.2 C)  TempSrc: Temporal  SpO2: 94%  Weight: 184 lb (83.5 kg)  Height: 5\' 2"  (1.575 m)   Body mass index is 33.65 kg/m. Wt Readings from Last 3 Encounters:  06/15/23 184 lb (83.5 kg)  05/24/23 184 lb (83.5 kg)  04/23/23 184 lb 9.6 oz (83.7 kg)    Physical Exam Constitutional:      General: She is not in acute distress.    Appearance: She is well-developed. She is not diaphoretic.  HENT:     Head: Normocephalic and atraumatic.     Mouth/Throat:     Pharynx: No oropharyngeal exudate.  Eyes:     Conjunctiva/sclera: Conjunctivae normal.     Pupils: Pupils are equal, round, and reactive to light.  Cardiovascular:     Rate and Rhythm: Normal rate and regular rhythm.     Heart sounds: Normal heart sounds.  Pulmonary:     Effort: Pulmonary effort is normal.     Breath sounds: Normal breath sounds.  Abdominal:     General: Bowel sounds are normal.     Palpations: Abdomen is soft.  Musculoskeletal:     Cervical back: Normal range of motion and neck supple.     Right lower leg: No edema.     Left lower leg: No edema.  Skin:    General: Skin is warm and dry.  Neurological:     Mental Status: She is alert.  Psychiatric:        Mood  and Affect: Mood normal.     Labs reviewed: Basic Metabolic Panel: Recent Labs    07/17/22 1436 11/27/22 1200 04/23/23 1340  NA 139 141 140  K 3.7 4.1 3.8  CL 103 104 100  CO2 26 23 29   GLUCOSE 101 97 99  BUN 17 19 17   CREATININE 0.68 0.80 0.80  CALCIUM 9.2 9.3 9.3  TSH 1.76  --   --    Liver Function Tests: Recent Labs    07/17/22 1436  AST 16  ALT 16  BILITOT 0.4  PROT 6.6   No results for input(s): "LIPASE", "AMYLASE" in the last 8760 hours. No results for input(s): "AMMONIA" in the last 8760 hours. CBC: Recent Labs    07/17/22 1436 11/27/22 1200 04/23/23  1340  WBC 6.0 5.0 7.0  NEUTROABS 3,294 2.9  --   HGB 13.0 12.1 12.3  HCT 38.7 37.0 38.2  MCV 92.1 94 96.0  PLT 190 184 170   Lipid Panel: Recent Labs    07/17/22 1436  CHOL 188  HDL 36*  LDLCALC 106*  TRIG 347*  CHOLHDL 5.2*   TSH: Recent Labs    07/17/22 1436  TSH 1.76   A1C: Lab Results  Component Value Date   HGBA1C 5.2 08/06/2017     Assessment/Plan 1. Paroxysmal atrial fibrillation (HCC) Rate controlled, no palpitations at this time, continues on metoprolol  2. Essential hypertension -Blood pressure well controlled, goal bp <140/90 Continue current medications and dietary modifications follow metabolic panel  3. Spinal stenosis of lumbar region with neurogenic claudication -worsening symptoms, followed by Dr Yetta Barre. Plans to undergo decompressive laminectomy when able to schedule.   4. Hyperlipidemia, unspecified hyperlipidemia type -will follow up lipids with next follow up.   5. Osteoporosis, unspecified osteoporosis type, unspecified pathological fracture presence -Recommended to take calcium 600 mg twice daily with Vitamin D 2000 units daily and weight bearing activity 30 mins/5 days a week  6. Do not resuscitation - Do not attempt resuscitation (DNR)  7. Primary insomnia Stable, uses trazodone PRN  8. Seasonal allergies -ongoing, does not like to use medication  routinely. Will use Claritin rarely.   9. Senile lentigines Previously prescribed by derm but no longer wishes to be seen there.  - hydroquinone 4 % cream; Apply topically daily.  Dispense: 28.35 g; Refill: 0   Return in about 4 months (around 10/15/2023) for routine follow up, labs with visit. .:   Janene Harvey. Biagio Borg [facility] [phone]

## 2023-06-19 ENCOUNTER — Ambulatory Visit (INDEPENDENT_AMBULATORY_CARE_PROVIDER_SITE_OTHER): Payer: Medicare HMO

## 2023-06-19 DIAGNOSIS — I495 Sick sinus syndrome: Secondary | ICD-10-CM | POA: Diagnosis not present

## 2023-06-19 LAB — CUP PACEART REMOTE DEVICE CHECK
Battery Remaining Longevity: 33 mo
Battery Remaining Percentage: 34 %
Battery Voltage: 2.93 V
Brady Statistic AP VP Percent: 6.8 %
Brady Statistic AP VS Percent: 89 %
Brady Statistic AS VP Percent: 1.6 %
Brady Statistic AS VS Percent: 2.3 %
Brady Statistic RA Percent Paced: 96 %
Brady Statistic RV Percent Paced: 8.4 %
Date Time Interrogation Session: 20240625020015
Implantable Lead Connection Status: 753985
Implantable Lead Connection Status: 753985
Implantable Lead Implant Date: 20170628
Implantable Lead Implant Date: 20170628
Implantable Lead Location: 753859
Implantable Lead Location: 753860
Implantable Pulse Generator Implant Date: 20170628
Lead Channel Impedance Value: 460 Ohm
Lead Channel Impedance Value: 560 Ohm
Lead Channel Pacing Threshold Amplitude: 0.75 V
Lead Channel Pacing Threshold Amplitude: 1.5 V
Lead Channel Pacing Threshold Pulse Width: 0.5 ms
Lead Channel Pacing Threshold Pulse Width: 0.5 ms
Lead Channel Sensing Intrinsic Amplitude: 1.1 mV
Lead Channel Sensing Intrinsic Amplitude: 9.2 mV
Lead Channel Setting Pacing Amplitude: 2 V
Lead Channel Setting Pacing Amplitude: 3 V
Lead Channel Setting Pacing Pulse Width: 0.5 ms
Lead Channel Setting Sensing Sensitivity: 2 mV
Pulse Gen Model: 2272
Pulse Gen Serial Number: 7910313

## 2023-06-25 ENCOUNTER — Other Ambulatory Visit: Payer: Self-pay | Admitting: Neurological Surgery

## 2023-06-26 ENCOUNTER — Telehealth: Payer: Self-pay

## 2023-06-26 NOTE — Telephone Encounter (Signed)
Spoke to patient she stated she is scheduled for back surgery 7/22.Stated she has appointment with Dr.Jordan 7/30.Stated she would like to see Dr.Jordan before surgery.Appointment with Dr.Jordan rescheduled to 7/11 at 10:00 am.Advised to have surgeon fax a surgical clearance.

## 2023-06-29 NOTE — Progress Notes (Unsigned)
Cardiology Office Note   Date:  12/28/2022   ID:  [name], DOB Aug 10, 1931, MRN [id number]  PCP:  Sharon Seller, NP  Cardiologist:   Paddy Walthall [place], MD   Chief Complaint  Patient presents with   Atrial Fibrillation     History of Present Illness: [name] is a 89 y.o. female is seen for follow up Afib and dyspnea. She also needs clearance for lumbar laminectomy with Dr Marikay Alar. She has a long history of marked sinus bradycardia and atrial fibrillation/flutter. She was seen initially in 2014. Echo at that time showed mild LAE otherwise normal. Holter showed mean HR 59 with lowest HR 43 and peak HR 109.    On March 9,2017 she noted an increased HR by BP monitor up to 153. At this time she felt marked indigestion from her waist to her neck. She felt her heart fluttering.   She was found to be in Afib with RVR. She was started on Eliquis and metoprolol. Myoview study and Echo were normal.   She later had attempt at DCCV. She was in an atypical atrial flutter at that time. DCCV resulted in very prolonged pauses > 6 seconds and return to flutter. She was seen by Dr. Elberta Fortis and placed on flecainide. This did convert her to NSR but made her feel very sick with nausea, dizziness, and extreme fatigue. Flecainide was discontinued.. She continued to have marked bradycardia and underwent PPM placement on 06/21/16. When seen in September 2018 by Dr. Ladona Ridgel she had an Afib burden of 94%. She was started on propafenone for her Afib. Initially this medication caused her to have more nausea but this has improved.  On her subsequent  checks her Afib burden was less than 1% since December 2019.   She was seen by Dr Ladona Ridgel on 11/17. By pacer check she was in Afib continually. Rate controlled. On follow up she noted some increased SOB and fatigue over the past month or two. Really is not aware of palpitations. No edema. No chest pain. She did undergo DCCV on 12/04/22. On follow up pacer check on  Dec 28 she was maintaining NSR. Was seen in Feb by EP and still doing well.   On her last pacer check she had Afib burden of 3.9% with good rate control.  No chest pain. She denies significant palpitations. No bleeding issues.    Past Medical History:  Diagnosis Date   Allergic rhinitis due to pollen    Anxiety state, unspecified    Sitautional- pain   Arthritis    "right knee" (06/21/2016)   Asymptomatic varicose veins    Carpal tunnel syndrome    Chest pain, unspecified    Chronic lower back pain    "on the left side" (06/21/2016)   Constipation    Cramp of limb    Depressive disorder, not elsewhere classified    Diaphragmatic hernia without mention of obstruction or gangrene    Diaphragmatic hernia without mention of obstruction or gangrene    Disturbance of skin sensation    Diverticulosis of colon (without mention of hemorrhage)    Dysrhythmia    Atrial Flutter   Enthesopathy of hip region    Esophageal reflux    , just occasional   History of blood transfusion    "w/both knee replacements"   History of duodenal ulcer    History of kidney stones    passed   Insomnia, unspecified    Lumbago  Migraine    "none since ~ 1990" (06/21/2016)   Myalgia and myositis, unspecified    Obesity, unspecified    Other abnormal blood chemistry    Other dysphagia    Other malaise and fatigue    Other nonspecific abnormal serum enzyme levels    Other specified cardiac dysrhythmias(427.89)    Pacemaker    Pain in joint, ankle and foot    Pain in joint, hand    Pain in joint, lower leg    Pain in joint, pelvic region and thigh    Pain in limb    Plantar fascial fibromatosis    Presence of permanent cardiac pacemaker    -[employer] Jude   Reflux esophagitis    Sciatica    Spinal stenosis, unspecified region other than cervical    Stricture and stenosis of esophagus    Symptomatic menopausal or female climacteric states    Unspecified essential hypertension    Unspecified essential  hypertension    Unspecified vitamin D deficiency     Past Surgical History:  Procedure Laterality Date   BREAST BIOPSY Left 1990s X 2   CARDIOVERSION N/A 04/26/2016   Procedure: CARDIOVERSION;  Surgeon: Chrystie Nose, MD;  Location: [facility] ENDOSCOPY;  Service: Cardiovascular;  Laterality: N/A;   CARDIOVERSION N/A 12/04/2022   Procedure: CARDIOVERSION;  Surgeon: Parke Poisson, MD;  Location: [facility] ENDOSCOPY;  Service: Cardiovascular;  Laterality: N/A;   CATARACT EXTRACTION W/ INTRAOCULAR LENS IMPLANT Left 08/03/1999   DR EPES    CATARACT EXTRACTION W/ INTRAOCULAR LENS IMPLANT Right 2000   DR EPES   CHOLECYSTECTOMY OPEN  1989   DR BOWMAN   COLONOSCOPY  1988   Normal    DENTAL SURGERY Left 08/2016   EP IMPLANTABLE DEVICE N/A 06/21/2016   Procedure: Pacemaker Implant;  Surgeon: Marinus Maw, MD;  Location: [facility] INVASIVE CV LAB;  Service: Cardiovascular;  Laterality: N/A;   ESOPHAGOGASTRODUODENOSCOPY (EGD) WITH ESOPHAGEAL DILATION  ~ 1982   Dr. Jarold Motto   EXCISION OF ACTINIC KERATOSIS     DR LUPTON    EYE SURGERY     INSERT / REPLACE / REMOVE PACEMAKER     JOINT REPLACEMENT     KNEE ARTHROSCOPY Left 2003   KNEE ARTHROSCOPY Right 06/26/2013   KNEE CLOSED REDUCTION Right 10/23/2013   Procedure: CLOSED MANIPULATION RIGHT KNEE;  Surgeon: Kathryne Hitch, MD;  Location: [facility] OR;  Service: Orthopedics;  Laterality: Right;   LASER FOR CLOUDY CAP LEFT EYE Left 03/2006   DR DIGBY   LUMBAR LAMINECTOMY/DECOMPRESSION MICRODISCECTOMY N/A 06/12/2019   Procedure: Laminectomy and Foraminotomy - Lumbar four-Lumbar five;  Surgeon: Tia Alert, MD;  Location: [facility] OR;  Service: Neurosurgery;  Laterality: N/A;   SKIN SURGERY     [facility], Dr.Gray. Removed area on upper chest   SKIN SURGERY     May 2023, upper left chest area, perfored by the skin surgery center   TOTAL KNEE ARTHROPLASTY Left 04/2004   DR RENDALL   TOTAL KNEE ARTHROPLASTY Right 07/04/2013   Procedure: RIGHT TOTAL  KNEE ARTHROPLASTY;  Surgeon: Kathryne Hitch, MD;  Location: [facility] ORS;  Service: Orthopedics;  Laterality: Right;   TRIGGER FINGER RELEASE Right 09/2018   VAGINAL HYSTERECTOMY  1979     Current Outpatient Medications  Medication Sig Dispense Refill   acetaminophen (TYLENOL) 500 MG tablet Take 500 mg by mouth every 6 (six) hours as needed for moderate pain.     alendronate (FOSAMAX) 70 MG tablet Take 1 tablet (  70 mg total) by mouth every 7 (seven) days. Take with a full glass of water on an empty stomach. 4 tablet 11   antiseptic oral rinse (BIOTENE) LIQD 15 mLs by Mouth Rinse route 2 (two) times daily.     Artificial Saliva (ACT DRY MOUTH) LOZG Use as directed 1 drop in the mouth or throat as needed (dry mouth).     bimatoprost (LUMIGAN) 0.01 % SOLN Place 1 drop into both eyes at bedtime.      Calcium Citrate-Vitamin D (CALCIUM + D PO) Take 1 tablet by mouth daily.     Cholecalciferol (VITAMIN D) 125 MCG (5000 UT) CAPS Take 5,000 Units by mouth daily.     dorzolamide (TRUSOPT) 2 % ophthalmic solution Place 1 drop into both eyes 3 (three) times daily.     ELIQUIS 5 MG TABS tablet TAKE 1 TABLET BY MOUTH TWICE A DAY 180 tablet 1   esomeprazole (NEXIUM) 20 MG capsule TAKE 1 CAPSULE (20 MG TOTAL) BY MOUTH AS NEEDED. 90 capsule 1   ezetimibe (ZETIA) 10 MG tablet TAKE ONE TABLET BY MOUTH ONCE DAILY 90 tablet 3   methylcellulose (ARTIFICIAL TEARS) 1 % ophthalmic solution Place 1 drop into both eyes daily as needed (for dry eyes).      metoprolol tartrate (LOPRESSOR) 25 MG tablet TAKE 1 TABLET BY MOUTH TWICE A DAY 180 tablet 0   OVER THE COUNTER MEDICATION Take 1 capsule by mouth 2 (two) times daily. HydroEye supplement     propafenone (RYTHMOL SR) 225 MG 12 hr capsule TAKE 1 CAPSULE BY MOUTH TWICE A DAY 180 capsule 2   traZODone (DESYREL) 50 MG tablet Take 50 mg by mouth at bedtime as needed for sleep.     trolamine salicylate (ASPERCREME) 10 % cream Apply 1 application topically as needed for  muscle pain (for knee pain).     valsartan-hydrochlorothiazide (DIOVAN-HCT) 320-25 MG tablet TAKE ONE TABLET BY MOUTH DAILY 90 tablet 1   No current facility-administered medications for this visit.    Allergies:   Advil [ibuprofen], Aleve [naproxen sodium], Aspirin, Atorvastatin, Celebrex [celecoxib], Diclofenac, Doxycycline, Nsaids, Other, Pravastatin, Timolol, and Metoclopramide hcl    Social History:  The patient  reports that she has never smoked. She has never used smokeless tobacco. She reports that she does not drink alcohol and does not use drugs.   Family History:  The patient's family history includes Cerebrovascular Accident in her mother; Heart disease in her father; Hypertension in her brother; Obesity in her daughter; Ovarian cancer in her mother; Uterine cancer in her mother.    ROS:  Please see the history of present illness.   Otherwise, review of systems are positive for none.   All other systems are reviewed and negative.    PHYSICAL EXAM: VS:  BP (!) 159/79   Pulse 64   Ht 5\' 2"  (1.575 m)   Wt 186 lb 9.6 oz (84.6 kg)   SpO2 93%   BMI 34.13 kg/m  , BMI Body mass index is 34.13 kg/m. GENERAL:  Well appearing, obese WF in NAD HEENT:  PERRL, EOMI, sclera are clear. Oropharynx is clear. NECK:  No jugular venous distention, carotid upstroke brisk and symmetric, no bruits, no thyromegaly or adenopathy LUNGS:  clear  CHEST:  Unremarkable HEART:  IRRR,  PMI not displaced or sustained,S1 and S2 within normal limits, no S3, no S4: no clicks, no rubs, no murmurs ABD:  Soft, nontender. BS +, no masses or bruits. No hepatomegaly, no splenomegaly  EXT:  2 + pulses throughout, no edema, no cyanosis no clubbing SKIN:  Warm and dry.  No rashes NEURO:  Alert and oriented x 3. Cranial nerves II through XII intact. PSYCH:  Cognitively intact  EKG Interpretation Date/Time:  Thursday July 05 2023 10:02:25 EDT Ventricular Rate:  66 PR Interval:    QRS Duration:  106 QT  Interval:  392 QTC Calculation: 410 R Axis:   -35  Text Interpretation: Atrial-paced rhythm Left axis deviation ST & T wave abnormality, consider lateral ischemia When compared with ECG of 04-Dec-2022 09:56, QRS axis Shifted left T wave inversion no longer evident in Inferior leads T wave inversion less evident in Lateral leads Confirmed by [place], Chinmayi Rumer [phone]) on 07/05/2023 10:13:46 AM     Recent Labs: 07/17/2022: ALT 16; TSH 1.76 11/27/2022: BUN 19; Creatinine, Ser 0.80; Hemoglobin 12.1; Platelets 184; Potassium 4.1; Sodium 141    Lipid Panel    Component Value Date/Time   CHOL 188 07/17/2022 1436   CHOL 179 03/27/2016 0853   TRIG 347 (H) 07/17/2022 1436   HDL 36 (L) 07/17/2022 1436   HDL 42 03/27/2016 0853   CHOLHDL 5.2 (H) 07/17/2022 1436   VLDL 42 (H) 08/06/2017 0918   LDLCALC 106 (H) 07/17/2022 1436      Wt Readings from Last 3 Encounters:  12/28/22 186 lb 9.6 oz (84.6 kg)  12/11/22 185 lb 12.8 oz (84.3 kg)  12/04/22 185 lb 6.5 oz (84.1 kg)      Other studies Reviewed: Additional studies/ records that were reviewed today include:  Echo: 09/06/17: Study Conclusions   - Left ventricle: The cavity size was normal. Wall thickness was   increased in a pattern of mild LVH. Systolic function was normal.   The estimated ejection fraction was in the range of 55% to 60%.   Doppler parameters are consistent with both elevated ventricular   end-diastolic filling pressure and elevated left atrial filling   pressure. - Left atrium: The atrium was moderately dilated. - Atrial septum: There was increased thickness of the septum,   consistent with lipomatous hypertrophy. No defect or patent   foramen ovale was identified  Echo 10/7//21: IMPRESSIONS     1. Left ventricular ejection fraction, by estimation, is 55 to 60%. The  left ventricle has normal function. The left ventricle has no regional  wall motion abnormalities. There is mild left ventricular hypertrophy.  Left  ventricular diastolic parameters  are consistent with Grade I diastolic dysfunction (impaired relaxation).  The E/e' is 11.   2. Right ventricular systolic function is normal. The right ventricular  size is normal.   3. Left atrial size was moderately dilated.   4. The mitral valve is grossly normal. No evidence of mitral valve  regurgitation.   5. The aortic valve is tricuspid. There is mild calcification of the  aortic valve. Aortic valve regurgitation is not visualized.   6. The inferior vena cava is normal in size with greater than 50%  respiratory variability, suggesting right atrial pressure of 3 mmHg.   Comparison(s): No significant change from prior study.   ASSESSMENT AND PLAN:  1.   Atrial fibrillation/flutter with RVR.  Intolerant of flecainide.  s/p PPM placement for marked post conversion pauses and bradycardia.  Has been on  propafenone with very low Afib  burden for 6 years until  November 2023 when she developed sustained AFib.  Followed by Dr Ladona Ridgel. She has been compliant with her medication. She was symptomatic. She is now  s/p  DCCV on Dec 11. Continue propafenone and anticoagulation. Afib  burden is still quite low. If Afib burden increases or is more sustained  may need to consider alternative AAD therapy. For now will hold the course. She is cleared to proceed with planned lumbar laminectomy. May hold Eliquis for 3 days prior. Will forward note to Dr Yetta Barre.  2. HTN well controlled.  3. Mild hypercholesterolemia.     Signed, Katy Brickell [place], MD,FACC  12/28/2022 2:48 PM    [facility] [address] [place], [place], [address] Phone [phone], Fax [phone]

## 2023-07-05 ENCOUNTER — Ambulatory Visit: Payer: Medicare HMO | Attending: Cardiology | Admitting: Cardiology

## 2023-07-05 ENCOUNTER — Encounter: Payer: Self-pay | Admitting: Cardiology

## 2023-07-05 VITALS — BP 142/84 | HR 65 | Ht 62.0 in | Wt 184.4 lb

## 2023-07-05 DIAGNOSIS — I10 Essential (primary) hypertension: Secondary | ICD-10-CM | POA: Diagnosis not present

## 2023-07-05 DIAGNOSIS — I480 Paroxysmal atrial fibrillation: Secondary | ICD-10-CM

## 2023-07-05 NOTE — Patient Instructions (Signed)
Medication Instructions:  Continue same medications *If you need a refill on your cardiac medications before your next appointment, please call your pharmacy*   Lab Work: None ordered   Testing/Procedures: None ordered   Follow-Up: At [facility], you and your health needs are our priority.  As part of our continuing mission to provide you with exceptional heart care, we have created designated Provider Care Teams.  These Care Teams include your primary Cardiologist (physician) and Advanced Practice Providers (APPs -  Physician Assistants and Nurse Practitioners) who all work together to provide you with the care you need, when you need it.  We recommend signing up for the patient portal called "MyChart".  Sign up information is provided on this After Visit Summary.  MyChart is used to connect with patients for Virtual Visits (Telemedicine).  Patients are able to view lab/test results, encounter notes, upcoming appointments, etc.  Non-urgent messages can be sent to your provider as well.   To learn more about what you can do with MyChart, go to [email or web address].    Your next appointment:  6 months    Call in Sept to schedule Jan appointment     Provider:  Dr.Jordan   

## 2023-07-06 ENCOUNTER — Encounter (HOSPITAL_COMMUNITY)
Admission: RE | Admit: 2023-07-06 | Discharge: 2023-07-06 | Disposition: A | Discharge status: Home / Self Care | Payer: Medicare HMO | Source: Ambulatory Visit | Attending: Neurological Surgery | Admitting: Neurological Surgery

## 2023-07-06 ENCOUNTER — Encounter (HOSPITAL_COMMUNITY): Payer: Self-pay

## 2023-07-06 ENCOUNTER — Encounter: Payer: Self-pay | Admitting: Internal Medicine

## 2023-07-06 ENCOUNTER — Other Ambulatory Visit: Payer: Self-pay

## 2023-07-06 VITALS — BP 151/68 | HR 62 | Temp 98.0°F | Ht 62.0 in | Wt 184.0 lb

## 2023-07-06 DIAGNOSIS — Z01818 Encounter for other preprocedural examination: Secondary | ICD-10-CM

## 2023-07-06 DIAGNOSIS — Z01812 Encounter for preprocedural laboratory examination: Secondary | ICD-10-CM | POA: Diagnosis not present

## 2023-07-06 DIAGNOSIS — I700 Atherosclerosis of aorta: Secondary | ICD-10-CM | POA: Diagnosis not present

## 2023-07-06 DIAGNOSIS — K222 Esophageal obstruction: Secondary | ICD-10-CM | POA: Diagnosis not present

## 2023-07-06 DIAGNOSIS — M5116 Intervertebral disc disorders with radiculopathy, lumbar region: Secondary | ICD-10-CM | POA: Insufficient documentation

## 2023-07-06 DIAGNOSIS — M48061 Spinal stenosis, lumbar region without neurogenic claudication: Secondary | ICD-10-CM | POA: Diagnosis not present

## 2023-07-06 DIAGNOSIS — M4726 Other spondylosis with radiculopathy, lumbar region: Secondary | ICD-10-CM | POA: Diagnosis not present

## 2023-07-06 DIAGNOSIS — Z950 Presence of cardiac pacemaker: Secondary | ICD-10-CM | POA: Insufficient documentation

## 2023-07-06 DIAGNOSIS — I4891 Unspecified atrial fibrillation: Secondary | ICD-10-CM | POA: Insufficient documentation

## 2023-07-06 DIAGNOSIS — I10 Essential (primary) hypertension: Secondary | ICD-10-CM | POA: Diagnosis not present

## 2023-07-06 LAB — BASIC METABOLIC PANEL
Anion gap: 8 (ref 5–15)
BUN: 15 mg/dL (ref 8–23)
CO2: 30 mmol/L (ref 22–32)
Calcium: 9.1 mg/dL (ref 8.9–10.3)
Chloride: 101 mmol/L (ref 98–111)
Creatinine, Ser: 0.76 mg/dL (ref 0.44–1.00)
GFR, Estimated: >60 mL/min (ref >60)
Glucose, Bld: 103 mg/dL — ABNORMAL HIGH (ref 70–99)
Potassium: 3.8 mmol/L (ref 3.5–5.1)
Sodium: 139 mmol/L (ref 135–145)

## 2023-07-06 LAB — CBC
HCT: 39.9 % (ref 36.0–46.0)
Hemoglobin: 13 g/dL (ref 12.0–15.0)
MCH: 31.1 pg (ref 26.0–34.0)
MCHC: 32.6 g/dL (ref 30.0–36.0)
MCV: 95.5 fL (ref 80.0–100.0)
Platelets: 166 10*3/uL (ref 150–400)
RBC: 4.18 MIL/uL (ref 3.87–5.11)
RDW: 12.8 % (ref 11.5–15.5)
WBC: 6.3 10*3/uL (ref 4.0–10.5)
nRBC: 0 % (ref 0.0–0.2)

## 2023-07-06 LAB — SURGICAL PCR SCREEN
MRSA, PCR: NEGATIVE
Staphylococcus aureus: NEGATIVE

## 2023-07-06 LAB — PROTIME-INR
INR: 1.2 (ref 0.8–1.2)
Prothrombin Time: 15.3 seconds — ABNORMAL HIGH (ref 11.4–15.2)

## 2023-07-06 NOTE — Progress Notes (Signed)
Device orders received. Kerry Fort, [employer] rep, notified of date and time of procedure

## 2023-07-06 NOTE — Pre-Procedure Instructions (Addendum)
Surgical Instructions   Your procedure is scheduled on Monday, July 22nd. Report to [facility] Main Entrance "A" at 09:20 A.M., then check in with the Admitting office. Any questions or running late day of surgery: call [phone]  Questions prior to your surgery date: call [phone], Monday-Friday, 8am-4pm. If you experience any cold or flu symptoms such as cough, fever, chills, shortness of breath, etc. between now and your scheduled surgery, please notify us at the above number.     Remember:  Do not eat or drink after midnight the night before your surgery      Take these medicines the morning of surgery with A SIP OF WATER  dorzolamide (TRUSOPT) eye drops ezetimibe (ZETIA)  metoprolol tartrate (LOPRESSOR)  propafenone (RYTHMOL SR)     May take these medicines IF NEEDED: acetaminophen (TYLENOL)  esomeprazole (NEXIUM)  methylcellulose (ARTIFICIAL TEARS)  sodium chloride (OCEAN) nasal spray traMADol (ULTRAM)    Hold Eliquis 3 days prior to surgery. Last dose 7/18.  One week prior to surgery, STOP taking any Aspirin (unless otherwise instructed by your surgeon) Aleve, Naproxen, Ibuprofen, Motrin, Advil, Goody's, BC's, all herbal medications, fish oil, and non-prescription vitamins.                     Do NOT Smoke (Tobacco/Vaping) for 24 hours prior to your procedure.  If you use a CPAP at night, you may bring your mask/headgear for your overnight stay.   You will be asked to remove any contacts, glasses, piercing's, hearing aid's, dentures/partials prior to surgery. Please bring cases for these items if needed.    Patients discharged the day of surgery will not be allowed to drive home, and someone needs to stay with them for 24 hours.  SURGICAL WAITING ROOM VISITATION Patients may have no more than 2 support people in the waiting area - these visitors may rotate.   Pre-op nurse will coordinate an appropriate time for 1 ADULT support person, who may not rotate, to  accompany patient in pre-op.  Children under the age of 62 must have an adult with them who is not the patient and must remain in the main waiting area with an adult.  If the patient needs to stay at the hospital during part of their recovery, the visitor guidelines for inpatient rooms apply.  Please refer to the [facility] website for the visitor guidelines for any additional information.   If you received a COVID test during your pre-op visit  it is requested that you wear a mask when out in public, stay away from anyone that may not be feeling well and notify your surgeon if you develop symptoms. If you have been in contact with anyone that has tested positive in the last 10 days please notify you surgeon.      Pre-operative 5 CHG Bathing Instructions   You can play a key role in reducing the risk of infection after surgery. Your skin needs to be as free of germs as possible. You can reduce the number of germs on your skin by washing with CHG (chlorhexidine gluconate) soap before surgery. CHG is an antiseptic soap that kills germs and continues to kill germs even after washing.   DO NOT use if you have an allergy to chlorhexidine/CHG or antibacterial soaps. If your skin becomes reddened or irritated, stop using the CHG and notify one of our RNs at [phone].   Please shower with the CHG soap starting 4 days before surgery using the  following schedule:     Please keep in mind the following:  DO NOT shave, including legs and underarms, starting the day of your first shower.   You may shave your face at any point before/day of surgery.  Place clean sheets on your bed the day you start using CHG soap. Use a clean washcloth (not used since being washed) for each shower. DO NOT sleep with pets once you start using the CHG.   CHG Shower Instructions:  If you choose to wash your hair and private area, wash first with your normal shampoo/soap.  After you use shampoo/soap, rinse your  hair and body thoroughly to remove shampoo/soap residue.  Turn the water OFF and apply about 3 tablespoons (45 ml) of CHG soap to a CLEAN washcloth.  Apply CHG soap ONLY FROM YOUR NECK DOWN TO YOUR TOES (washing for 3-5 minutes)  DO NOT use CHG soap on face, private areas, open wounds, or sores.  Pay special attention to the area where your surgery is being performed.  If you are having back surgery, having someone wash your back for you may be helpful. Wait 2 minutes after CHG soap is applied, then you may rinse off the CHG soap.  Pat dry with a clean towel  Put on clean clothes/pajamas   If you choose to wear lotion, please use ONLY the CHG-compatible lotions on the back of this paper.   Additional instructions for the day of surgery: DO NOT APPLY any lotions, deodorants, cologne, or perfumes.   Do not bring valuables to the hospital. [facility] is not responsible for any belongings/valuables. Do not wear nail polish, gel polish, artificial nails, or any other type of covering on natural nails (fingers and toes) Do not wear jewelry or makeup Put on clean/comfortable clothes.  Please brush your teeth.  Ask your nurse before applying any prescription medications to the skin.     CHG Compatible Lotions   Aveeno Moisturizing lotion  Cetaphil Moisturizing Cream  Cetaphil Moisturizing Lotion  Clairol Herbal Essence Moisturizing Lotion, Dry Skin  Clairol Herbal Essence Moisturizing Lotion, Extra Dry Skin  Clairol Herbal Essence Moisturizing Lotion, Normal Skin  Curel Age Defying Therapeutic Moisturizing Lotion with Alpha Hydroxy  Curel Extreme Care Body Lotion  Curel Soothing Hands Moisturizing Hand Lotion  Curel Therapeutic Moisturizing Cream, Fragrance-Free  Curel Therapeutic Moisturizing Lotion, Fragrance-Free  Curel Therapeutic Moisturizing Lotion, Original Formula  Eucerin Daily Replenishing Lotion  Eucerin Dry Skin Therapy Plus Alpha Hydroxy Crme  Eucerin Dry Skin Therapy  Plus Alpha Hydroxy Lotion  Eucerin Original Crme  Eucerin Original Lotion  Eucerin Plus Crme Eucerin Plus Lotion  Eucerin TriLipid Replenishing Lotion  Keri Anti-Bacterial Hand Lotion  Keri Deep Conditioning Original Lotion Dry Skin Formula Softly Scented  Keri Deep Conditioning Original Lotion, Fragrance Free Sensitive Skin Formula  Keri Lotion Fast Absorbing Fragrance Free Sensitive Skin Formula  Keri Lotion Fast Absorbing Softly Scented Dry Skin Formula  Keri Original Lotion  Keri Skin Renewal Lotion Keri Silky Smooth Lotion  Keri Silky Smooth Sensitive Skin Lotion  Nivea Body Creamy Conditioning Oil  Nivea Body Extra Enriched Lotion  Nivea Body Original Lotion  Nivea Body Sheer Moisturizing Lotion Nivea Crme  Nivea Skin Firming Lotion  NutraDerm 30 Skin Lotion  NutraDerm Skin Lotion  NutraDerm Therapeutic Skin Cream  NutraDerm Therapeutic Skin Lotion  ProShield Protective Hand Cream  Provon moisturizing lotion  Please read over the following fact sheets that you were given.

## 2023-07-06 NOTE — Progress Notes (Signed)
PERIOPERATIVE PRESCRIPTION FOR IMPLANTED CARDIAC DEVICE PROGRAMMING  Patient Information: Name:  [name]  DOB:  09/04/1931  MRN:  [id number]    Planned Procedure:  Bilateral - - L3-L4 - L4-L5 Decompressive laminectomy  Surgeon:  Dr. Marikay Alar  Date of Procedure:  07/16/23  Cautery will be used.  Position during surgery:  prone   Please send documentation back to:  [facility] (Fax # [phone])  Device Information:  Clinic EP Physician:  Lewayne Bunting, MD   Device Type:  Pacemaker Manufacturer and Phone #:  [employer]: [phone] Pacemaker Dependent?:  Yes.   Date of Last Device Check:  06/19/2023 Normal Device Function?:  Yes.    Electrophysiologist's Recommendations:  Have magnet available. Provide continuous ECG monitoring when magnet is used or reprogramming is to be performed.  Procedure will likely interfere with device function.  Device should be programmed:  Asynchronous pacing during procedure and returned to normal programming after procedure  Per Device Clinic Standing Orders, [name], RN  11:44 AM 07/06/2023

## 2023-07-06 NOTE — Progress Notes (Signed)
PCP - Abbey Chatters Cardiologist - Dr. Peter [place]    EP - Dr.Taylor for pacemaker   PPM/ICD - yes Pacemaker Device Orders - yes Rep Notified - yes Arlys John notified  Chest x-ray - denies EKG - 07-05-2023 Stress Test - 11-06-16 ECHO - 09-30-2020 Cardiac Cath - denies  Sleep Study - Denies  Fasting Blood Sugar - Denies  Last dose of GLP1 agonist-  Denies  Blood Thinner Instructions: Eliquis patient instructed to hold 3 days prior to surgery per cardiology  ERAS Protcol - No NPO  COVID TEST- N/a  Anesthesia review: Yes cardiac history and previously cancelled surgery do to insurance.  Patient denies shortness of breath, fever, cough and chest pain at PAT appointment   All instructions explained to the patient, with a verbal understanding of the material. Patient agrees to go over the instructions while at home for a better understanding. Patient also instructed to self quarantine after being tested for COVID-19. The opportunity to ask questions was provided.

## 2023-07-09 NOTE — Progress Notes (Signed)
Anesthesia Chart Review:  Case: [id number] Date/Time: 07/16/23 1108   Procedure: Bilateral - - L3-L4 - L4-L5 Decompressive laminectomy (Bilateral: Back) - 3C   Anesthesia type: General   Pre-op diagnosis: Stenosis   [facility] / [facility]   Surgeons: Arman Bogus, MD       DISCUSSION: Patient is a 89 year old female scheduled for the above procedure. Surgery was delayed from 04/23/23 to 07/16/23 while awaiting insurance approval.    History includes never smoker, HTN, afib/flutter (s/p unsuccessful aflutter DCCV 04/26/16; afib DCCV 12/04/22), PPM (St. Jude 2272 Assurity MRI 06/21/16), reflux esophagitis, diaphragmatic hernia, osteoarthritis (left TKA 05/16/04; right TKA 07/04/13), spinal surgery (L4-5 laminectomy 06/12/19).   Last cardiology visit with Dr. Swazil[place] was on 07/05/23 for follow-up SB and afib/flutter. Intolerant to flecainide. Has been managed with propafenone, but required DCCV in 11/2022 for symptomatic sustained afib. She is also on Lopressor 25 mg BID. By his note, her afib burden was "3.9% with good rate control" at last interrogation. If Afib burden increases or is more sustained may need to consider alternative AAD therapy. No chest pain. He cleared her for lumbar laminectomy with permission to hold Eliquis for 3 days prior. She also saw Dr. Ladona Ridgel on 05/24/23. Her PPM was working normally with improved rates. She was pacing in there atrium over 99% of the time. He felt she was "low risk for cardiac complications from pending back surgery. She may proceed. She can stop her blood thinner 3 days prior to surgery. She can restart when the bleeding risk is acceptable."    EP perioperative PPM recommendations: Device Information: Clinic EP Physician:  Lewayne Bunting, MD  Device Type:  Pacemaker Manufacturer and Phone #:  St. Jude/[employer]ott: [phone] Pacemaker Dependent?:  Yes.   Date of Last Device Check:  06/19/2023     Normal Device Function?:  Yes.      Electrophysiologist's Recommendations: Have magnet available. Provide continuous ECG monitoring when magnet is used or reprogramming is to be performed.  Procedure will likely interfere with device function.  Device should be programmed:  Asynchronous pacing during procedure and returned to normal programming after procedure  Anesthesia team to evaluate on the day of surgery.    VS: BP (!) 151/68   Pulse 62   Temp 36.7 C   Ht 5\' 2"  (1.575 m)   Wt 83.5 kg   SpO2 100%   BMI 33.65 kg/m   PROVIDERS:  Sharon Seller, NP is PCP  [place], Peter, MD is cardiologist Lewayne Bunting, MD is EP cardiologist   LABS: Labs reviewed: Acceptable for surgery. INR 1.2. (all labs ordered are listed, but only abnormal results are displayed)  Labs Reviewed  PROTIME-INR - Abnormal; Notable for the following components:      Result Value   Prothrombin Time 15.3 (*)    All other components within normal limits  BASIC METABOLIC PANEL - Abnormal; Notable for the following components:   Glucose, Bld 103 (*)    All other components within normal limits  SURGICAL PCR SCREEN  CBC     IMAGES: CT L-spine 10/23/22: IMPRESSION: 1. 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 Correlation with radiographs is recommended prior to any operative intervention. 2. Progressive multilevel lumbar spondylosis as described above. New superiorly migrating right subarticular and foraminal disc protrusion at L4-L5 with impingement of the right L4 nerve root. Progressive mild-to-moderate spinal canal stenosis at this level. 3. Progressive moderate right lateral recess stenosis at  L3-L4. 4. Interval posterior decompression at L5-S1 with resolved spinal canal stenosis. 5.  Aortic Atherosclerosis (ICD10-I70.0).     EKG:  EKG 07/05/23: Atrial-paced rhythm Left axis deviation ST & T wave abnormality, consider lateral ischemia When compared with ECG of 04-Dec-2022 09:56, QRS axis  Shifted left T wave inversion no longer evident in Inferior leads T wave inversion less evident in Lateral leads Confirmed by [place], Peter [phone]) on 07/05/2023 10:13:46 AM  EKG 05/24/23:  A paced rhythm with prolonged AV conduction Left anterior fascicular block Cannot rule out anterior infarct, age undetermined       CV: Echo 09/30/20: IMPRESSIONS   1. Left ventricular ejection fraction, by estimation, is 55 to 60%. The  left ventricle has normal function. The left ventricle has no regional  wall motion abnormalities. There is mild left ventricular hypertrophy.  Left ventricular diastolic parameters  are consistent with Grade I diastolic dysfunction (impaired relaxation).  The E/e' is 11.   2. Right ventricular systolic function is normal. The right ventricular  size is normal.   3. Left atrial size was moderately dilated.   4. The mitral valve is grossly normal. No evidence of mitral valve  regurgitation.   5. The aortic valve is tricuspid. There is mild calcification of the  aortic valve. Aortic valve regurgitation is not visualized.   6. The inferior vena cava is normal in size with greater than 50%  respiratory variability, suggesting right atrial pressure of 3 mmHg.  - Comparison(s): No significant change from prior study.     ETT 10/27/16: Blood pressure demonstrated a normal response to exercise. There was no ST segment deviation noted during stress. Poor exercise tolerance, 1 minutes and 37 seconds-exercise stopped probably because of atrial fibrillation with rapid ventricular response. Baseline ECG appears to be sinus rhythm with first-degree AV block however there are various pre-exercise EKGs which show potentially underlying atrial tachycardia. Exercise induced atrial fibrillation. No electrocardiographic evidence of ischemia.     Nuclear stress test 04/21/16: The left ventricular ejection fraction is mildly decreased (45-54%). Nuclear stress EF: 51%. There was  no ST segment deviation noted during stress. The study is normal. This is a low risk study. Low risk stress nuclear study with normal perfusion and borderline left ventricular global systolic function.  Consider alternative imaging for confirmation of LVEF, which may be underestimated on current study,     Past Medical History:  Diagnosis Date   A-fib (HCC)    Allergic rhinitis due to pollen    Anxiety state, unspecified    Sitautional- pain   Arthritis    "right knee" (06/21/2016)   Asymptomatic varicose veins    Carpal tunnel syndrome    Chest pain, unspecified    Chronic lower back pain    "on the left side" (06/21/2016)   Constipation    Cramp of limb    Depressive disorder, not elsewhere classified    pt denies   Diaphragmatic hernia without mention of obstruction or gangrene    Diaphragmatic hernia without mention of obstruction or gangrene    Disturbance of skin sensation    Diverticulosis of colon (without mention of hemorrhage)    Dysrhythmia    Atrial Flutter   Enthesopathy of hip region    Esophageal reflux    , just occasional   History of blood transfusion    "w/both knee replacements"   History of duodenal ulcer    History of kidney stones    passed   Insomnia, unspecified  Lumbago    Migraine    "none since ~ 1990" (06/21/2016)   Myalgia and myositis, unspecified    Obesity, unspecified    Other abnormal blood chemistry    Other dysphagia    Other malaise and fatigue    Other nonspecific abnormal serum enzyme levels    Other specified cardiac dysrhythmias(427.89)    Pacemaker    Pain in joint, ankle and foot    Pain in joint, hand    Pain in joint, lower leg    Pain in joint, pelvic region and thigh    Pain in limb    Plantar fascial fibromatosis    Presence of permanent cardiac pacemaker    -St Jude   Reflux esophagitis    Sciatica    Spinal stenosis, unspecified region other than cervical    Stricture and stenosis of esophagus     Symptomatic menopausal or female climacteric states    Unspecified essential hypertension    Unspecified essential hypertension    Unspecified vitamin D deficiency     Past Surgical History:  Procedure Laterality Date   BREAST BIOPSY Left 1990s X 2   CARDIOVERSION N/A 04/26/2016   Procedure: CARDIOVERSION;  Surgeon: Chrystie Nose, MD;  Location: [facility] ENDOSCOPY;  Service: Cardiovascular;  Laterality: N/A;   CARDIOVERSION N/A 12/04/2022   Procedure: CARDIOVERSION;  Surgeon: Parke Poisson, MD;  Location: [facility] ENDOSCOPY;  Service: Cardiovascular;  Laterality: N/A;   CATARACT EXTRACTION W/ INTRAOCULAR LENS IMPLANT Left 08/03/1999   DR EPES    CATARACT EXTRACTION W/ INTRAOCULAR LENS IMPLANT Right 2000   DR EPES   CHOLECYSTECTOMY OPEN  1989   DR BOWMAN   COLONOSCOPY  1988   Normal    DENTAL SURGERY Left 08/2016   EP IMPLANTABLE DEVICE N/A 06/21/2016   Procedure: Pacemaker Implant;  Surgeon: Marinus Maw, MD;  Location: [facility] INVASIVE CV LAB;  Service: Cardiovascular;  Laterality: N/A;   ESOPHAGOGASTRODUODENOSCOPY (EGD) WITH ESOPHAGEAL DILATION  ~ 1982   Dr. Jarold Motto   EXCISION OF ACTINIC KERATOSIS     DR LUPTON    INSERT / REPLACE / REMOVE PACEMAKER  06/21/2016   St Jude   KNEE ARTHROSCOPY Left 2003   KNEE ARTHROSCOPY Right 06/26/2013   KNEE CLOSED REDUCTION Right 10/23/2013   Procedure: CLOSED MANIPULATION RIGHT KNEE;  Surgeon: Kathryne Hitch, MD;  Location: [facility] OR;  Service: Orthopedics;  Laterality: Right;   LASER FOR CLOUDY CAP LEFT EYE Left 03/2006   DR DIGBY   LUMBAR LAMINECTOMY/DECOMPRESSION MICRODISCECTOMY N/A 06/12/2019   Procedure: Laminectomy and Foraminotomy - Lumbar four-Lumbar five;  Surgeon: Tia Alert, MD;  Location: [facility] OR;  Service: Neurosurgery;  Laterality: N/A;   SKIN SURGERY  04/2022   [facility], Dr.Gray. Removed area on upper chest   TOTAL KNEE ARTHROPLASTY Left 04/2004   DR RENDALL   TOTAL KNEE ARTHROPLASTY Right 07/04/2013    Procedure: RIGHT TOTAL KNEE ARTHROPLASTY;  Surgeon: Kathryne Hitch, MD;  Location: [facility] ORS;  Service: Orthopedics;  Laterality: Right;   TRIGGER FINGER RELEASE Right 09/2018   VAGINAL HYSTERECTOMY  1979    MEDICATIONS:  acetaminophen (TYLENOL) 500 MG tablet   antiseptic oral rinse (BIOTENE) LIQD   Artificial Saliva (ACT DRY MOUTH) LOZG   bimatoprost (LUMIGAN) 0.01 % SOLN   Calcium Citrate-Vitamin D (CALCIUM + D PO)   Capsaicin (ASPERCREME PAIN RELIEF PATCH EX)   Cholecalciferol (VITAMIN D3 SUPER STRENGTH) 50 MCG (2000 UT) TABS   cyanocobalamin (VITAMIN B12) 1000 MCG tablet  Dentifrices (BIOTENE DRY MOUTH GENTLE) PSTE   Docusate Calcium (STOOL SOFTENER PO)   dorzolamide (TRUSOPT) 2 % ophthalmic solution   ELIQUIS 5 MG TABS tablet   esomeprazole (NEXIUM) 20 MG capsule   ezetimibe (ZETIA) 10 MG tablet   Glycerin, Laxative, (ADULT SUPPOSITORY RE)   hydroquinone 4 % cream   Magnesium Oxide (MAGNESIUM EXTRA STRENGTH PO)   methylcellulose (ARTIFICIAL TEARS) 1 % ophthalmic solution   metoprolol tartrate (LOPRESSOR) 25 MG tablet   OVER THE COUNTER MEDICATION   propafenone (RYTHMOL SR) 225 MG 12 hr capsule   sodium chloride (OCEAN) 0.65 % SOLN nasal spray   traMADol (ULTRAM) 50 MG tablet   traZODone (DESYREL) 50 MG tablet   valsartan-hydrochlorothiazide (DIOVAN-HCT) 320-25 MG tablet   No current facility-administered medications for this encounter.    Shonna Chock, PA-C Surgical Short Stay/Anesthesiology [facility] Phone [phone] [facility] Phone [phone] 07/09/2023 2:57 PM

## 2023-07-09 NOTE — Anesthesia Preprocedure Evaluation (Addendum)
Anesthesia Evaluation  Patient identified by MRN, date of birth, ID band Patient awake    Reviewed: Allergy & Precautions, NPO status , Patient's Chart, lab work & pertinent test results, reviewed documented beta blocker date and time   History of Anesthesia Complications Negative for: history of anesthetic complications  Airway Mallampati: II  TM Distance: >3 FB Neck ROM: Full    Dental  (+) Dental Advisory Given   Pulmonary neg pulmonary ROS   breath sounds clear to auscultation       Cardiovascular hypertension, Pt. on medications and Pt. on home beta blockers (-) angina + dysrhythmias Atrial Fibrillation + pacemaker  Rhythm:Regular Rate:Normal  '21 ECHO: EF 55-60%.  1. The LV has normal function, no regional wall motion abnormalities. There is mild LVH. Grade I diastolic dysfunction (impaired relaxation).   2. RVF is normal. The right ventricular size is normal.   3. Left atrial size was moderately dilated.   4. The mitral valve is grossly normal. No evidence of mitral valve regurgitation.   5. The aortic valve is tricuspid. There is mild calcification of the aortic valve. Aortic valve regurgitation is not visualized.     Neuro/Psych  Headaches  Anxiety Depression    Chronic back pain     GI/Hepatic Neg liver ROS, hiatal hernia,GERD  Medicated and Controlled,,  Endo/Other  negative endocrine ROS  BMI 33  Renal/GU negative Renal ROS     Musculoskeletal  (+) Arthritis ,    Abdominal   Peds  Hematology eliquis   Anesthesia Other Findings   Reproductive/Obstetrics                             Anesthesia Physical Anesthesia Plan  ASA: 3  Anesthesia Plan: General   Post-op Pain Management: Tylenol PO (pre-op)*   Induction: Intravenous  PONV Risk Score and Plan: 3 and Ondansetron, Dexamethasone and Treatment may vary due to age or medical condition  Airway Management Planned: Oral  ETT  Additional Equipment: None  Intra-op Plan:   Post-operative Plan: Extubation in OR  Informed Consent: I have reviewed the patients History and Physical, chart, labs and discussed the procedure including the risks, benefits and alternatives for the proposed anesthesia with the patient or authorized representative who has indicated his/her understanding and acceptance.     Dental advisory given  Plan Discussed with: CRNA and Surgeon  Anesthesia Plan Comments: (PAT note written 07/09/2023 by Shonna Chock, PA-C.  )       Anesthesia Quick Evaluation

## 2023-07-11 NOTE — Progress Notes (Signed)
 Remote pacemaker transmission.   

## 2023-07-11 NOTE — Progress Notes (Signed)
Pt called to confirm what time to arrive for her surgery on Monday 7/22. Pt is to arrive at 0920, for an 1120 start.

## 2023-07-12 DIAGNOSIS — H0102A Squamous blepharitis right eye, upper and lower eyelids: Secondary | ICD-10-CM | POA: Diagnosis not present

## 2023-07-12 DIAGNOSIS — H16223 Keratoconjunctivitis sicca, not specified as Sjogren's, bilateral: Secondary | ICD-10-CM | POA: Diagnosis not present

## 2023-07-12 DIAGNOSIS — H353131 Nonexudative age-related macular degeneration, bilateral, early dry stage: Secondary | ICD-10-CM | POA: Diagnosis not present

## 2023-07-12 DIAGNOSIS — H401131 Primary open-angle glaucoma, bilateral, mild stage: Secondary | ICD-10-CM | POA: Diagnosis not present

## 2023-07-16 ENCOUNTER — Ambulatory Visit (HOSPITAL_COMMUNITY): Payer: Medicare HMO

## 2023-07-16 ENCOUNTER — Ambulatory Visit (HOSPITAL_BASED_OUTPATIENT_CLINIC_OR_DEPARTMENT_OTHER): Payer: Medicare HMO

## 2023-07-16 ENCOUNTER — Other Ambulatory Visit: Payer: Self-pay

## 2023-07-16 ENCOUNTER — Observation Stay (HOSPITAL_COMMUNITY)
Admission: RE | Admit: 2023-07-16 | Discharge: 2023-07-17 | Disposition: A | Discharge status: Home / Self Care | Payer: Medicare HMO | Attending: Neurological Surgery | Admitting: Neurological Surgery

## 2023-07-16 ENCOUNTER — Ambulatory Visit (HOSPITAL_COMMUNITY): Payer: Medicare HMO | Admitting: Vascular Surgery

## 2023-07-16 ENCOUNTER — Encounter (HOSPITAL_COMMUNITY): Payer: Self-pay | Admitting: Neurological Surgery

## 2023-07-16 ENCOUNTER — Encounter (HOSPITAL_COMMUNITY)
Admission: RE | Disposition: A | Discharge status: Home / Self Care | Payer: Self-pay | Source: Home / Self Care | Attending: Neurological Surgery

## 2023-07-16 DIAGNOSIS — I10 Essential (primary) hypertension: Secondary | ICD-10-CM | POA: Insufficient documentation

## 2023-07-16 DIAGNOSIS — M48061 Spinal stenosis, lumbar region without neurogenic claudication: Principal | ICD-10-CM | POA: Insufficient documentation

## 2023-07-16 DIAGNOSIS — Z7901 Long term (current) use of anticoagulants: Secondary | ICD-10-CM | POA: Diagnosis not present

## 2023-07-16 DIAGNOSIS — Z950 Presence of cardiac pacemaker: Secondary | ICD-10-CM | POA: Diagnosis not present

## 2023-07-16 DIAGNOSIS — I4891 Unspecified atrial fibrillation: Secondary | ICD-10-CM | POA: Diagnosis not present

## 2023-07-16 DIAGNOSIS — Z9889 Other specified postprocedural states: Principal | ICD-10-CM

## 2023-07-16 DIAGNOSIS — F418 Other specified anxiety disorders: Secondary | ICD-10-CM | POA: Diagnosis not present

## 2023-07-16 DIAGNOSIS — I484 Atypical atrial flutter: Secondary | ICD-10-CM

## 2023-07-16 DIAGNOSIS — Z96653 Presence of artificial knee joint, bilateral: Secondary | ICD-10-CM | POA: Insufficient documentation

## 2023-07-16 DIAGNOSIS — E7800 Pure hypercholesterolemia, unspecified: Secondary | ICD-10-CM

## 2023-07-16 DIAGNOSIS — Z79899 Other long term (current) drug therapy: Secondary | ICD-10-CM | POA: Diagnosis not present

## 2023-07-16 DIAGNOSIS — Z981 Arthrodesis status: Secondary | ICD-10-CM | POA: Diagnosis not present

## 2023-07-16 HISTORY — PX: LUMBAR LAMINECTOMY/DECOMPRESSION MICRODISCECTOMY: SHX5026

## 2023-07-16 SURGERY — LUMBAR LAMINECTOMY/DECOMPRESSION MICRODISCECTOMY 2 LEVELS
Anesthesia: General | Site: Back | Laterality: Bilateral

## 2023-07-16 MED ORDER — LIDOCAINE 2% (20 MG/ML) 5 ML SYRINGE
INTRAMUSCULAR | Status: AC
  Filled 2023-07-16: qty 5

## 2023-07-16 MED ORDER — SODIUM CHLORIDE 0.9% FLUSH: 3.0000 mL | INTRAVENOUS | Status: DC | PRN

## 2023-07-16 MED ORDER — FENTANYL CITRATE (PF) 250 MCG/5ML IJ SOLN
INTRAMUSCULAR | Status: AC
  Filled 2023-07-16: qty 5

## 2023-07-16 MED ORDER — FENTANYL CITRATE (PF) 100 MCG/2ML IJ SOLN
INTRAMUSCULAR | Status: AC
  Filled 2023-07-16: qty 2

## 2023-07-16 MED ORDER — HEMOSTATIC AGENTS (NO CHARGE) OPTIME
TOPICAL | Status: DC | PRN
  Administered 2023-07-16 (×2): 1 via TOPICAL

## 2023-07-16 MED ORDER — BUPIVACAINE HCL (PF) 0.25 % IJ SOLN
INTRAMUSCULAR | Status: DC | PRN
  Administered 2023-07-16: 5 mL

## 2023-07-16 MED ORDER — ORAL CARE MOUTH RINSE: 15.0000 mL | Freq: Once | OROMUCOSAL | Status: AC

## 2023-07-16 MED ORDER — DORZOLAMIDE HCL 2 % OP SOLN
1.0000 [drp] | Freq: Three times a day (TID) | OPHTHALMIC | Status: DC
  Administered 2023-07-16 – 2023-07-17 (×2): 1 [drp] via OPHTHALMIC
  Filled 2023-07-16: qty 10

## 2023-07-16 MED ORDER — SENNA 8.6 MG PO TABS
1.0000 | ORAL_TABLET | Freq: Two times a day (BID) | ORAL | Status: DC
  Administered 2023-07-16 – 2023-07-17 (×2): 8.6 mg via ORAL
  Filled 2023-07-16 (×2): qty 1

## 2023-07-16 MED ORDER — PROPOFOL 10 MG/ML IV BOLUS
INTRAVENOUS | Status: DC | PRN
  Administered 2023-07-16: 100 mg via INTRAVENOUS

## 2023-07-16 MED ORDER — ROCURONIUM BROMIDE 10 MG/ML (PF) SYRINGE
PREFILLED_SYRINGE | INTRAVENOUS | Status: AC
  Filled 2023-07-16: qty 10

## 2023-07-16 MED ORDER — CEFAZOLIN SODIUM-DEXTROSE 2-4 GM/100ML-% IV SOLN
2.0000 g | Freq: Three times a day (TID) | INTRAVENOUS | Status: AC
  Administered 2023-07-16 – 2023-07-17 (×2): 2 g via INTRAVENOUS
  Filled 2023-07-16 (×2): qty 100

## 2023-07-16 MED ORDER — MENTHOL 3 MG MT LOZG: 1.0000 | LOZENGE | OROMUCOSAL | Status: DC | PRN

## 2023-07-16 MED ORDER — PROPAFENONE HCL ER 225 MG PO CP12
225.0000 mg | ORAL_CAPSULE | Freq: Two times a day (BID) | ORAL | Status: DC
  Administered 2023-07-16 – 2023-07-17 (×2): 225 mg via ORAL
  Filled 2023-07-16 (×3): qty 1

## 2023-07-16 MED ORDER — ONDANSETRON HCL 4 MG/2ML IJ SOLN
INTRAMUSCULAR | Status: AC
  Filled 2023-07-16: qty 2

## 2023-07-16 MED ORDER — CEFAZOLIN SODIUM-DEXTROSE 2-4 GM/100ML-% IV SOLN
2.0000 g | INTRAVENOUS | Status: AC
  Administered 2023-07-16: 2 g via INTRAVENOUS
  Filled 2023-07-16: qty 100

## 2023-07-16 MED ORDER — THROMBIN 20000 UNITS EX SOLR
CUTANEOUS | Status: DC | PRN
  Administered 2023-07-16: 10000 [IU] via TOPICAL

## 2023-07-16 MED ORDER — DEXAMETHASONE 4 MG PO TABS
4.0000 mg | ORAL_TABLET | Freq: Four times a day (QID) | ORAL | Status: DC
  Administered 2023-07-16 – 2023-07-17 (×4): 4 mg via ORAL
  Filled 2023-07-16 (×4): qty 1

## 2023-07-16 MED ORDER — ACETAMINOPHEN 500 MG PO TABS
1000.0000 mg | ORAL_TABLET | ORAL | Status: DC
  Filled 2023-07-16: qty 2

## 2023-07-16 MED ORDER — PHENOL 1.4 % MT LIQD: 1.0000 | OROMUCOSAL | Status: DC | PRN

## 2023-07-16 MED ORDER — THROMBIN 5000 UNITS EX SOLR
CUTANEOUS | Status: AC
  Filled 2023-07-16: qty 5000

## 2023-07-16 MED ORDER — ONDANSETRON HCL 4 MG PO TABS: 4.0000 mg | ORAL_TABLET | Freq: Four times a day (QID) | ORAL | Status: DC | PRN

## 2023-07-16 MED ORDER — LACTATED RINGERS IV SOLN: INTRAVENOUS | Status: DC

## 2023-07-16 MED ORDER — SODIUM CHLORIDE 0.9% FLUSH: 3.0000 mL | Freq: Two times a day (BID) | INTRAVENOUS | Status: DC

## 2023-07-16 MED ORDER — ONDANSETRON HCL 4 MG/2ML IJ SOLN
INTRAMUSCULAR | Status: DC | PRN
  Administered 2023-07-16: 4 mg via INTRAVENOUS

## 2023-07-16 MED ORDER — DEXAMETHASONE SODIUM PHOSPHATE 4 MG/ML IJ SOLN: 4.0000 mg | Freq: Four times a day (QID) | INTRAMUSCULAR | Status: DC

## 2023-07-16 MED ORDER — HYDROCHLOROTHIAZIDE 25 MG PO TABS
25.0000 mg | ORAL_TABLET | Freq: Every day | ORAL | Status: DC
  Administered 2023-07-17: 25 mg via ORAL
  Filled 2023-07-16: qty 1

## 2023-07-16 MED ORDER — ACETAMINOPHEN 500 MG PO TABS
1000.0000 mg | ORAL_TABLET | Freq: Four times a day (QID) | ORAL | Status: AC
  Administered 2023-07-16 – 2023-07-17 (×4): 1000 mg via ORAL
  Filled 2023-07-16 (×4): qty 2

## 2023-07-16 MED ORDER — PANTOPRAZOLE SODIUM 40 MG PO TBEC
40.0000 mg | DELAYED_RELEASE_TABLET | Freq: Every day | ORAL | Status: DC
  Administered 2023-07-16 – 2023-07-17 (×2): 40 mg via ORAL
  Filled 2023-07-16 (×2): qty 1

## 2023-07-16 MED ORDER — MORPHINE SULFATE (PF) 2 MG/ML IV SOLN: 2.0000 mg | INTRAVENOUS | Status: DC | PRN

## 2023-07-16 MED ORDER — CHLORHEXIDINE GLUCONATE CLOTH 2 % EX PADS: 6.0000 | MEDICATED_PAD | Freq: Once | CUTANEOUS | Status: DC

## 2023-07-16 MED ORDER — PROMETHAZINE HCL 25 MG/ML IJ SOLN
INTRAMUSCULAR | Status: AC
  Filled 2023-07-16: qty 1

## 2023-07-16 MED ORDER — LATANOPROST 0.005 % OP SOLN
1.0000 [drp] | Freq: Every day | OPHTHALMIC | Status: DC
  Administered 2023-07-16: 1 [drp] via OPHTHALMIC
  Filled 2023-07-16: qty 2.5

## 2023-07-16 MED ORDER — VALSARTAN-HYDROCHLOROTHIAZIDE 320-25 MG PO TABS: 1.0000 | ORAL_TABLET | Freq: Every day | ORAL | Status: DC

## 2023-07-16 MED ORDER — LABETALOL HCL 5 MG/ML IV SOLN
INTRAVENOUS | Status: DC | PRN
  Administered 2023-07-16: 5 mg via INTRAVENOUS

## 2023-07-16 MED ORDER — SODIUM CHLORIDE 0.9 % IV SOLN: 250.0000 mL | INTRAVENOUS | Status: DC

## 2023-07-16 MED ORDER — EZETIMIBE 10 MG PO TABS
10.0000 mg | ORAL_TABLET | Freq: Every day | ORAL | Status: DC
  Administered 2023-07-17: 10 mg via ORAL
  Filled 2023-07-16: qty 1

## 2023-07-16 MED ORDER — ROCURONIUM BROMIDE 10 MG/ML (PF) SYRINGE
PREFILLED_SYRINGE | INTRAVENOUS | Status: DC | PRN
  Administered 2023-07-16: 10 mg via INTRAVENOUS
  Administered 2023-07-16: 50 mg via INTRAVENOUS
  Administered 2023-07-16 (×2): 10 mg via INTRAVENOUS

## 2023-07-16 MED ORDER — THROMBIN 5000 UNITS EX SOLR
OROMUCOSAL | Status: DC | PRN
  Administered 2023-07-16: 5 mL via TOPICAL

## 2023-07-16 MED ORDER — GABAPENTIN 300 MG PO CAPS
300.0000 mg | ORAL_CAPSULE | ORAL | Status: AC
  Administered 2023-07-16: 300 mg via ORAL
  Filled 2023-07-16: qty 1

## 2023-07-16 MED ORDER — FENTANYL CITRATE (PF) 250 MCG/5ML IJ SOLN
INTRAMUSCULAR | Status: DC | PRN
  Administered 2023-07-16: 50 ug via INTRAVENOUS
  Administered 2023-07-16: 100 ug via INTRAVENOUS
  Administered 2023-07-16 (×2): 50 ug via INTRAVENOUS

## 2023-07-16 MED ORDER — BUPIVACAINE HCL (PF) 0.25 % IJ SOLN
INTRAMUSCULAR | Status: AC
  Filled 2023-07-16: qty 30

## 2023-07-16 MED ORDER — TRAMADOL HCL 50 MG PO TABS
50.0000 mg | ORAL_TABLET | Freq: Four times a day (QID) | ORAL | Status: DC | PRN
  Administered 2023-07-16 – 2023-07-17 (×3): 50 mg via ORAL
  Filled 2023-07-16 (×3): qty 1

## 2023-07-16 MED ORDER — PHENYLEPHRINE HCL-NACL 20-0.9 MG/250ML-% IV SOLN
INTRAVENOUS | Status: DC | PRN
  Administered 2023-07-16: 10 ug/min via INTRAVENOUS

## 2023-07-16 MED ORDER — THROMBIN 20000 UNITS EX SOLR
CUTANEOUS | Status: AC
  Filled 2023-07-16: qty 20000

## 2023-07-16 MED ORDER — PROPOFOL 10 MG/ML IV BOLUS
INTRAVENOUS | Status: AC
  Filled 2023-07-16: qty 20

## 2023-07-16 MED ORDER — METOPROLOL TARTRATE 25 MG PO TABS
25.0000 mg | ORAL_TABLET | Freq: Two times a day (BID) | ORAL | Status: DC
  Administered 2023-07-16 – 2023-07-17 (×2): 25 mg via ORAL
  Filled 2023-07-16 (×2): qty 1

## 2023-07-16 MED ORDER — ONDANSETRON HCL 4 MG/2ML IJ SOLN: 4.0000 mg | Freq: Four times a day (QID) | INTRAMUSCULAR | Status: DC | PRN

## 2023-07-16 MED ORDER — DEXAMETHASONE SODIUM PHOSPHATE 10 MG/ML IJ SOLN
INTRAMUSCULAR | Status: DC | PRN
  Administered 2023-07-16: 5 mg via INTRAVENOUS

## 2023-07-16 MED ORDER — DEXAMETHASONE SODIUM PHOSPHATE 10 MG/ML IJ SOLN
INTRAMUSCULAR | Status: AC
  Filled 2023-07-16: qty 1

## 2023-07-16 MED ORDER — MIDAZOLAM HCL 2 MG/2ML IJ SOLN: 0.5000 mg | Freq: Once | INTRAMUSCULAR | Status: DC | PRN

## 2023-07-16 MED ORDER — LIDOCAINE 2% (20 MG/ML) 5 ML SYRINGE
INTRAMUSCULAR | Status: DC | PRN
  Administered 2023-07-16: 20 mg via INTRAVENOUS

## 2023-07-16 MED ORDER — POTASSIUM CHLORIDE IN NACL 20-0.9 MEQ/L-% IV SOLN: INTRAVENOUS | Status: DC

## 2023-07-16 MED ORDER — 0.9 % SODIUM CHLORIDE (POUR BTL) OPTIME
TOPICAL | Status: DC | PRN
  Administered 2023-07-16: 1000 mL

## 2023-07-16 MED ORDER — OXYCODONE HCL 5 MG/5ML PO SOLN: 5.0000 mg | Freq: Once | ORAL | Status: DC | PRN

## 2023-07-16 MED ORDER — CHLORHEXIDINE GLUCONATE 0.12 % MT SOLN
15.0000 mL | Freq: Once | OROMUCOSAL | Status: AC
  Administered 2023-07-16: 15 mL via OROMUCOSAL
  Filled 2023-07-16: qty 15

## 2023-07-16 MED ORDER — PHENYLEPHRINE HCL-NACL 20-0.9 MG/250ML-% IV SOLN
INTRAVENOUS | Status: AC
  Filled 2023-07-16: qty 250

## 2023-07-16 MED ORDER — PROMETHAZINE HCL 25 MG/ML IJ SOLN
6.2500 mg | INTRAMUSCULAR | Status: AC | PRN
  Administered 2023-07-16 (×2): 6.25 mg via INTRAVENOUS

## 2023-07-16 MED ORDER — METHOCARBAMOL 1000 MG/10ML IJ SOLN: 500.0000 mg | Freq: Four times a day (QID) | INTRAVENOUS | Status: DC | PRN

## 2023-07-16 MED ORDER — FENTANYL CITRATE (PF) 100 MCG/2ML IJ SOLN
25.0000 ug | INTRAMUSCULAR | Status: DC | PRN
  Administered 2023-07-16 (×2): 50 ug via INTRAVENOUS

## 2023-07-16 MED ORDER — IRBESARTAN 150 MG PO TABS
300.0000 mg | ORAL_TABLET | Freq: Every day | ORAL | Status: DC
  Administered 2023-07-17: 300 mg via ORAL
  Filled 2023-07-16: qty 2

## 2023-07-16 MED ORDER — METHOCARBAMOL 500 MG PO TABS
500.0000 mg | ORAL_TABLET | Freq: Four times a day (QID) | ORAL | Status: DC | PRN
  Administered 2023-07-16 – 2023-07-17 (×3): 500 mg via ORAL
  Filled 2023-07-16 (×3): qty 1

## 2023-07-16 MED ORDER — OXYCODONE HCL 5 MG PO TABS: 5.0000 mg | ORAL_TABLET | Freq: Once | ORAL | Status: DC | PRN

## 2023-07-16 SURGICAL SUPPLY — 48 items
ADH SKN CLS APL DERMABOND .7 (GAUZE/BANDAGES/DRESSINGS) ×1
APL SKNCLS STERI-STRIP NONHPOA (GAUZE/BANDAGES/DRESSINGS) ×1
BAG COUNTER SPONGE SURGICOUNT (BAG) ×2 IMPLANT
BAG SPNG CNTER NS LX DISP (BAG) ×2
BENZOIN TINCTURE PRP APPL 2/3 (GAUZE/BANDAGES/DRESSINGS) ×2 IMPLANT
BUR CARBIDE MATCH 3.0 (BURR) ×2 IMPLANT
CANISTER SUCT 3000ML PPV (MISCELLANEOUS) ×2 IMPLANT
DERMABOND ADVANCED .7 DNX12 (GAUZE/BANDAGES/DRESSINGS) IMPLANT
DRAPE LAPAROTOMY 100X72X124 (DRAPES) ×2 IMPLANT
DRAPE MICROSCOPE SLANT 54X150 (MISCELLANEOUS) ×2 IMPLANT
DRAPE SURG 17X23 STRL (DRAPES) ×2 IMPLANT
DRSG OPSITE POSTOP 4X6 (GAUZE/BANDAGES/DRESSINGS) IMPLANT
DURAPREP 26ML APPLICATOR (WOUND CARE) ×2 IMPLANT
ELECT REM PT RETURN 9FT ADLT (ELECTROSURGICAL) ×1
ELECTRODE REM PT RTRN 9FT ADLT (ELECTROSURGICAL) ×2 IMPLANT
GAUZE 4X4 16PLY ~~LOC~~+RFID DBL (SPONGE) IMPLANT
GLOVE BIO SURGEON STRL SZ7 (GLOVE) IMPLANT
GLOVE BIO SURGEON STRL SZ8 (GLOVE) ×2 IMPLANT
GLOVE BIOGEL PI IND STRL 7.0 (GLOVE) IMPLANT
GLOVE BIOGEL PI IND STRL 7.5 (GLOVE) IMPLANT
GLOVE BIOGEL PI IND STRL 8.5 (GLOVE) IMPLANT
GLOVE ECLIPSE 8.5 STRL (GLOVE) IMPLANT
GLOVE SURG SS PI 6.5 STRL IVOR (GLOVE) IMPLANT
GOWN STRL REUS W/ TWL LRG LVL3 (GOWN DISPOSABLE) IMPLANT
GOWN STRL REUS W/ TWL XL LVL3 (GOWN DISPOSABLE) ×2 IMPLANT
GOWN STRL REUS W/TWL 2XL LVL3 (GOWN DISPOSABLE) IMPLANT
GOWN STRL REUS W/TWL LRG LVL3 (GOWN DISPOSABLE) ×1
GOWN STRL REUS W/TWL XL LVL3 (GOWN DISPOSABLE) ×3
HEMOSTAT POWDER KIT SURGIFOAM (HEMOSTASIS) ×2 IMPLANT
KIT BASIN OR (CUSTOM PROCEDURE TRAY) ×2 IMPLANT
KIT TURNOVER KIT B (KITS) ×2 IMPLANT
NDL HYPO 25X1 1.5 SAFETY (NEEDLE) ×2 IMPLANT
NDL SPNL 20GX3.5 QUINCKE YW (NEEDLE) IMPLANT
NEEDLE HYPO 25X1 1.5 SAFETY (NEEDLE) ×1 IMPLANT
NEEDLE SPNL 20GX3.5 QUINCKE YW (NEEDLE) IMPLANT
NS IRRIG 1000ML POUR BTL (IV SOLUTION) ×2 IMPLANT
PACK LAMINECTOMY NEURO (CUSTOM PROCEDURE TRAY) ×2 IMPLANT
PAD ARMBOARD 7.5X6 YLW CONV (MISCELLANEOUS) ×6 IMPLANT
SEALANT ADHERUS EXTEND TIP (MISCELLANEOUS) IMPLANT
SPONGE SURGIFOAM ABS GEL SZ50 (HEMOSTASIS) ×2 IMPLANT
STRIP CLOSURE SKIN 1/2X4 (GAUZE/BANDAGES/DRESSINGS) ×2 IMPLANT
SUT VIC AB 0 CT1 18XCR BRD8 (SUTURE) ×2 IMPLANT
SUT VIC AB 0 CT1 8-18 (SUTURE) ×1
SUT VIC AB 2-0 CP2 18 (SUTURE) ×2 IMPLANT
SUT VIC AB 3-0 SH 8-18 (SUTURE) ×2 IMPLANT
TOWEL GREEN STERILE (TOWEL DISPOSABLE) ×2 IMPLANT
TOWEL GREEN STERILE FF (TOWEL DISPOSABLE) ×2 IMPLANT
WATER STERILE IRR 1000ML POUR (IV SOLUTION) ×2 IMPLANT

## 2023-07-16 NOTE — Transfer of Care (Signed)
Immediate Anesthesia Transfer of Care Note  Patient: [name]  Procedure(s) Performed: Right- - Lumbsr three-Lumbar four - Lumbar four-Lumbar five Decompressive laminectomy (Bilateral: Back)  Patient Location: PACU  Anesthesia Type:General  Level of Consciousness: awake, oriented, and patient cooperative  Airway & Oxygen Therapy: Patient Spontanous Breathing and Patient connected to face mask oxygen  Post-op Assessment: Report given to RN and Post -op Vital signs reviewed and stable  Post vital signs: Reviewed and stable  Last Vitals:  Vitals Value Taken Time  BP 153/74 07/16/23 1445  Temp 37.4 C 07/16/23 1445  Pulse 58 07/16/23 1448  Resp 15 07/16/23 1448  SpO2 98 % 07/16/23 1448  Vitals shown include unfiled device data.  Last Pain:  Vitals:   07/16/23 0931  TempSrc: Oral         Complications: There were no known notable events for this encounter.

## 2023-07-16 NOTE — Anesthesia Postprocedure Evaluation (Signed)
Anesthesia Post Note  Patient: [name]  Procedure(s) Performed: Right- - Lumbar three-Lumbar four - Lumbar four-Lumbar five Decompressive laminectomy (Bilateral: Back)     Patient location during evaluation: PACU Anesthesia Type: General Level of consciousness: sedated, patient cooperative and oriented Pain management: pain level controlled Vital Signs Assessment: post-procedure vital signs reviewed and stable Respiratory status: spontaneous breathing, nonlabored ventilation, respiratory function stable and patient connected to nasal cannula oxygen Cardiovascular status: blood pressure returned to baseline and stable Postop Assessment: no apparent nausea or vomiting (nausea relieved) Anesthetic complications: no   There were no known notable events for this encounter.  Last Vitals:  Vitals:   07/16/23 1600 07/16/23 1615  BP: (!) 153/70 (!) 156/68  Pulse: 61 61  Resp: 15 13  Temp:  37.1 C  SpO2: 95% 97%    Last Pain:  Vitals:   07/16/23 1615  TempSrc:   PainSc: Asleep                 Mackey Varricchio,E. Jae Bruck

## 2023-07-16 NOTE — Anesthesia Procedure Notes (Signed)
Procedure Name: Intubation Date/Time: 07/16/2023 12:34 PM  Performed by: Owens Loffler, RNPre-anesthesia Checklist: Patient identified, Emergency Drugs available, Patient being monitored, Timeout performed and Suction available Patient Re-evaluated:Patient Re-evaluated prior to induction Oxygen Delivery Method: Circle system utilized Preoxygenation: Pre-oxygenation with 100% oxygen Induction Type: IV induction Ventilation: Oral airway inserted - appropriate to patient size Laryngoscope Size: Mac and 3 Grade View: Grade II Tube type: Oral Tube size: 7.0 mm Number of attempts: 1 Airway Equipment and Method: Stylet Placement Confirmation: ETT inserted through vocal cords under direct vision, positive ETCO2, CO2 detector and breath sounds checked- equal and bilateral Secured at: 21 cm Tube secured with: Tape Dental Injury: Teeth and Oropharynx as per pre-operative assessment  Comments: Grade IIa view

## 2023-07-16 NOTE — H&P (Signed)
Subjective: Patient is a 89 y.o. female admitted for R leg pain with numbness. Onset of symptoms was several months ago, gradually worsening since that time.  The pain is rated severe, and is located at the across the lower back and radiates to RLE. The pain is described as aching and occurs intermittently. The symptoms have been progressive. Symptoms are exacerbated by exercise, standing, and walking for more than a few minutes. MRI or CT showed stenosis at L3-4 L4-5  Past Medical History:  Diagnosis Date   A-fib [facility])    Allergic rhinitis due to pollen    Anxiety state, unspecified    Sitautional- pain   Arthritis    "right knee" (06/21/2016)   Asymptomatic varicose veins    Carpal tunnel syndrome    Chest pain, unspecified    Chronic lower back pain    "on the left side" (06/21/2016)   Constipation    Cramp of limb    Depressive disorder, not elsewhere classified    pt denies   Diaphragmatic hernia without mention of obstruction or gangrene    Diaphragmatic hernia without mention of obstruction or gangrene    Disturbance of skin sensation    Diverticulosis of colon (without mention of hemorrhage)    Dysrhythmia    Atrial Flutter   Enthesopathy of hip region    Esophageal reflux    , just occasional   History of blood transfusion    "w/both knee replacements"   History of duodenal ulcer    History of kidney stones    passed   Insomnia, unspecified    Lumbago    Migraine    "none since ~ 1990" (06/21/2016)   Myalgia and myositis, unspecified    Obesity, unspecified    Other abnormal blood chemistry    Other dysphagia    Other malaise and fatigue    Other nonspecific abnormal serum enzyme levels    Other specified cardiac dysrhythmias(427.89)    Pacemaker    Pain in joint, ankle and foot    Pain in joint, hand    Pain in joint, lower leg    Pain in joint, pelvic region and thigh    Pain in limb    Plantar fascial fibromatosis    Presence of permanent cardiac  pacemaker    -St Jude   Reflux esophagitis    Sciatica    Spinal stenosis, unspecified region other than cervical    Stricture and stenosis of esophagus    Symptomatic menopausal or female climacteric states    Unspecified essential hypertension    Unspecified essential hypertension    Unspecified vitamin D deficiency     Past Surgical History:  Procedure Laterality Date   BREAST BIOPSY Left 1990s X 2   CARDIOVERSION N/A 04/26/2016   Procedure: CARDIOVERSION;  Surgeon: Chrystie Nose, MD;  Location: [facility] ENDOSCOPY;  Service: Cardiovascular;  Laterality: N/A;   CARDIOVERSION N/A 12/04/2022   Procedure: CARDIOVERSION;  Surgeon: Parke Poisson, MD;  Location: [facility] ENDOSCOPY;  Service: Cardiovascular;  Laterality: N/A;   CATARACT EXTRACTION W/ INTRAOCULAR LENS IMPLANT Left 08/03/1999   DR EPES    CATARACT EXTRACTION W/ INTRAOCULAR LENS IMPLANT Right 2000   DR EPES   CHOLECYSTECTOMY OPEN  1989   DR BOWMAN   COLONOSCOPY  1988   Normal    DENTAL SURGERY Left 08/2016   EP IMPLANTABLE DEVICE N/A 06/21/2016   Procedure: Pacemaker Implant;  Surgeon: Marinus Maw, MD;  Location: [facility] INVASIVE CV LAB;  Service:  Cardiovascular;  Laterality: N/A;   ESOPHAGOGASTRODUODENOSCOPY (EGD) WITH ESOPHAGEAL DILATION  ~ 1982   Dr. Jarold Motto   EXCISION OF ACTINIC KERATOSIS     DR LUPTON    INSERT / REPLACE / REMOVE PACEMAKER  06/21/2016   St Jude   KNEE ARTHROSCOPY Left 2003   KNEE ARTHROSCOPY Right 06/26/2013   KNEE CLOSED REDUCTION Right 10/23/2013   Procedure: CLOSED MANIPULATION RIGHT KNEE;  Surgeon: Kathryne Hitch, MD;  Location: [facility] OR;  Service: Orthopedics;  Laterality: Right;   LASER FOR CLOUDY CAP LEFT EYE Left 03/2006   DR DIGBY   LUMBAR LAMINECTOMY/DECOMPRESSION MICRODISCECTOMY N/A 06/12/2019   Procedure: Laminectomy and Foraminotomy - Lumbar four-Lumbar five;  Surgeon: Tia Alert, MD;  Location: [facility] OR;  Service: Neurosurgery;  Laterality: N/A;   SKIN SURGERY  04/2022   [facility]  Dermatology, Dr.Gray. Removed area on upper chest   TOTAL KNEE ARTHROPLASTY Left 04/2004   DR RENDALL   TOTAL KNEE ARTHROPLASTY Right 07/04/2013   Procedure: RIGHT TOTAL KNEE ARTHROPLASTY;  Surgeon: Kathryne Hitch, MD;  Location: [facility] ORS;  Service: Orthopedics;  Laterality: Right;   TRIGGER FINGER RELEASE Right 09/2018   VAGINAL HYSTERECTOMY  1979    Prior to Admission medications   Medication Sig Start Date End Date Taking? Authorizing Provider  acetaminophen (TYLENOL) 500 MG tablet Take 500-1,000 mg by mouth every 6 (six) hours as needed for moderate pain.   Yes [provider]  antiseptic oral rinse (BIOTENE) LIQD 15 mLs by Mouth Rinse route 2 (two) times daily.   Yes [provider]  Artificial Saliva (ACT DRY MOUTH) LOZG Use as directed 1 drop in the mouth or throat as needed (dry mouth).   Yes [provider]  bimatoprost (LUMIGAN) 0.01 % SOLN Place 1 drop into both eyes at bedtime.    Yes [provider]  Calcium Citrate-Vitamin D (CALCIUM + D PO) Take 1 tablet by mouth in the morning.   Yes [provider]  Capsaicin (ASPERCREME PAIN RELIEF PATCH EX) Place 1 patch onto the skin daily as needed (pain.).   Yes [provider]  Cholecalciferol (VITAMIN D3 SUPER STRENGTH) 50 MCG (2000 UT) TABS Take 2,000 Units by mouth daily.   Yes [provider]  cyanocobalamin (VITAMIN B12) 1000 MCG tablet Take 1,000 mcg by mouth in the morning.   Yes [provider]  Dentifrices (BIOTENE DRY MOUTH GENTLE) PSTE Place 1 Application onto teeth in the morning and at bedtime.   Yes [provider]  Docusate Calcium (STOOL SOFTENER PO) Take 100 mg by mouth 3 (three) times a week. At night   Yes [provider]  dorzolamide (TRUSOPT) 2 % ophthalmic solution Place 1 drop into both eyes 3 (three) times daily.   Yes [provider]  ELIQUIS 5 MG TABS tablet TAKE 1 TABLET BY MOUTH TWICE A DAY 03/20/23  Yes  [place], Peter M, MD  esomeprazole (NEXIUM) 20 MG capsule TAKE 1 CAPSULE (20 MG TOTAL) BY MOUTH AS NEEDED. 09/28/21  Yes Sharon Seller, NP  ezetimibe (ZETIA) 10 MG tablet TAKE ONE TABLET BY MOUTH ONCE DAILY 10/17/22  Yes Sharon Seller, NP  Glycerin, Laxative, (ADULT SUPPOSITORY RE) Place 1 suppository rectally daily as needed (constipation.).   Yes [provider]  hydroquinone 4 % cream Apply topically daily. Patient taking differently: Apply 1 Application topically every other day. 06/15/23  Yes Sharon Seller, NP  Magnesium Oxide (MAGNESIUM EXTRA STRENGTH PO) Apply 1 Application topically  as needed (knee pain.). MAGNESIUM OIL   Yes [provider]  methylcellulose (ARTIFICIAL TEARS) 1 % ophthalmic solution Place 1 drop into both eyes 3 (three) times daily as needed (for dry eyes).   Yes [provider]  metoprolol tartrate (LOPRESSOR) 25 MG tablet TAKE 1 TABLET BY MOUTH TWICE A DAY 02/19/23  Yes Marinus Maw, MD  OVER THE COUNTER MEDICATION Take 1 capsule by mouth 2 (two) times daily. HydroEye Softgels Dry Eye Relief   Yes [provider]  propafenone (RYTHMOL SR) 225 MG 12 hr capsule TAKE 1 CAPSULE BY MOUTH TWICE A DAY 01/23/23  Yes [place], Peter M, MD  sodium chloride (OCEAN) 0.65 % SOLN nasal spray Place 1 spray into both nostrils 2 (two) times daily as needed (dry nasal passages). [employer] Saline Nasal Moisturizing Spray   Yes [provider]  traMADol (ULTRAM) 50 MG tablet Take 50 mg by mouth every 6 (six) hours as needed (pain.). 03/01/23  Yes [provider]  traZODone (DESYREL) 50 MG tablet TAKE 1 TABLET BY MOUTH EVERYDAY AT BEDTIME Patient taking differently: Take 25-50 mg by mouth at bedtime as needed (pain.). 01/23/23  Yes Sharon Seller, NP  valsartan-hydrochlorothiazide (DIOVAN-HCT) 320-25 MG tablet TAKE ONE TABLET BY MOUTH DAILY 04/17/23  Yes Sharon Seller, NP   Allergies  Allergen Reactions   Advil [Ibuprofen]  Other (See Comments)    GI upset   Aleve [Naproxen Sodium] Other (See Comments)    GI upset   Aspirin Other (See Comments)    Heartburn/indigestion.   Atorvastatin Other (See Comments)    Leg cramps   Celebrex [Celecoxib] Other (See Comments)    GI upset   Diclofenac     GI upset   Doxycycline Other (See Comments)    Headache   Nsaids Other (See Comments)    Tears my stomach up    Other     Antihistamine - increase BP   Pravastatin Other (See Comments)    Leg cramps   Timolol Other (See Comments)    Dropped HR   Metoclopramide Hcl Palpitations    Social History   Tobacco Use   Smoking status: Never   Smokeless tobacco: Never  Substance Use Topics   Alcohol use: No    Family History  Problem Relation Age of Onset   Ovarian cancer Mother    Uterine cancer Mother    Cerebrovascular Accident Mother    Heart disease Father    Hypertension Brother    Obesity Daughter      Review of Systems  Positive ROS: neg  All other systems have been reviewed and were otherwise negative with the exception of those mentioned in the HPI and as above.  Objective: Vital signs in last 24 hours: Temp:  [97.8 F (36.6 C)] 97.8 F (36.6 C) (07/22 0931) Pulse Rate:  [66] 66 (07/22 0931) Resp:  [20] 20 (07/22 0931) BP: (197)/(89) 197/89 (07/22 0931) SpO2:  [99 %] 99 % (07/22 0931) Weight:  [82.1 kg] 82.1 kg (07/22 0931)  General Appearance: Alert, cooperative, no distress, appears stated age Head: Normocephalic, without obvious abnormality, atraumatic Eyes: PERRL, conjunctiva/corneas clear, EOM's intact    Neck: Supple, symmetrical, trachea midline Back: Symmetric, no curvature, ROM normal, no CVA tenderness Lungs:  respirations unlabored Heart: Regular rate and rhythm Abdomen: Soft, non-tender Extremities: Extremities normal, atraumatic, no cyanosis or edema Pulses: 2+ and symmetric all extremities Skin: Skin color, texture, turgor normal, no rashes or lesions  NEUROLOGIC:  Mental status: Alert and oriented x4,  no aphasia, good attention span, fund of knowledge, and memory Motor Exam - grossly normal Sensory Exam - grossly normal Reflexes: 1+ Coordination - grossly normal Gait - grossly normal Balance - grossly normal Cranial Nerves: I: smell Not tested  II: visual acuity  OS: nl    OD: nl  II: visual fields Full to confrontation  II: pupils Equal, round, reactive to light  III,VII: ptosis None  III,IV,VI: extraocular muscles  Full ROM  V: mastication Normal  V: facial light touch sensation  Normal  V,VII: corneal reflex  Present  VII: facial muscle function - upper  Normal  VII: facial muscle function - lower Normal  VIII: hearing Not tested  IX: soft palate elevation  Normal  IX,X: gag reflex Present  XI: trapezius strength  5/5  XI: sternocleidomastoid strength 5/5  XI: neck flexion strength  5/5  XII: tongue strength  Normal    Data Review Lab Results  Component Value Date   WBC 6.3 07/06/2023   HGB 13.0 07/06/2023   HCT 39.9 07/06/2023   MCV 95.5 07/06/2023   PLT 166 07/06/2023   Lab Results  Component Value Date   NA 139 07/06/2023   K 3.8 07/06/2023   CL 101 07/06/2023   CO2 30 07/06/2023   BUN 15 07/06/2023   CREATININE 0.76 07/06/2023   GLUCOSE 103 (H) 07/06/2023   Lab Results  Component Value Date   INR 1.2 07/06/2023    Assessment/Plan:  Estimated body mass index is 33.11 kg/m as calculated from the following:   Height as of this encounter: 5\' 2"  (1.575 m).   Weight as of this encounter: 82.1 kg. Patient admitted for L3-4 L4-5 laminectomy for stenosis. Patient has failed a reasonable attempt at conservative therapy.  I explained the condition and procedure to the patient and answered any questions.  Patient wishes to proceed with procedure as planned. Understands risks/ benefits and typical outcomes of procedure.   Tia Alert 07/16/2023 11:34 AM

## 2023-07-16 NOTE — Op Note (Signed)
07/16/2023  2:25 PM  PATIENT:  [name]  89 y.o. female  PRE-OPERATIVE DIAGNOSIS: Lumbar spinal stenosis L3-4 L4-5 with right leg pain with numbness and weakness  POST-OPERATIVE DIAGNOSIS:  same  PROCEDURE: Decompressive lumbar hemilaminectomy medial facetectomy and foraminotomies L3-4 and L4-5 on the right  SURGEON:  Marikay Alar, MD  ASSISTANTS: Dr. Barnett Abu  ANESTHESIA:   General  EBL: 40 ml  Total I/O In: -  Out: 40 [Blood:40]  BLOOD ADMINISTERED: none  DRAINS: None  SPECIMEN:  none  INDICATION FOR PROCEDURE: This patient presented with right leg pain with numbness and weakness. Imaging showed final stenosis L3-4 and L4-5 on the right. The patient tried conservative measures without relief. Pain was debilitating. Recommended right L3-4 and L4-5 hemilaminectomy. Patient understood the risks, benefits, and alternatives and potential outcomes and wished to proceed.  PROCEDURE DETAILS: The patient was taken to the operating room and after induction of adequate generalized endotracheal anesthesia, the patient was rolled into the prone position on the Wilson frame and all pressure points were padded. The lumbar region was cleaned and then prepped with DuraPrep and draped in the usual sterile fashion. 5 cc of local anesthesia was injected and then a dorsal midline incision was made and carried down to the lumbo sacral fascia. The fascia was opened and the paraspinous musculature was taken down in a subperiosteal fashion to expose L3-4 and L4-5 on the right. Intraoperative x-ray confirmed my level, and then I used a combination of the high-speed drill and the Kerrison punches to perform a hemilaminectomy, medial facetectomy, and foraminotomy at L3 4 and L4-5 on the right. The underlying yellow ligament was opened and removed in a piecemeal fashion to expose the underlying dura and exiting nerve root. I undercut the lateral recess and dissected down until I was medial to and distal  to the pedicle. The nerve root was well decompressed.  I then palpated with a coronary dilator along the nerve root and into the foramen to assure adequate decompression. I felt no more compression of the nerve root.  At the superior lateral part of the L4-5 decompression there is a small unintended durotomy laterally.  This was about 1 to 2 mm and we felt it would be difficult to repair.  It was up under the bone.  I irrigated with saline solution containing bacitracin. Achieved hemostasis with bipolar cautery, lined the dura with Gelfoam at L3-4 and used a small piece of muscle and Tisseel fibrin glue over the dura at L4-5 on the right, and then closed the fascia with 0 Vicryl. I closed the subcutaneous tissues with 2-0 Vicryl and the subcuticular tissues with 3-0 Vicryl. The skin was then closed with benzoin and Steri-Strips. The drapes were removed, a sterile dressing was applied.  My nurse practitioner was involved in the exposure, safe retraction of the neural elements, t and the closure. the patient was awakened from general anesthesia and transferred to the recovery room in stable condition. At the end of the procedure all sponge, needle and instrument counts were correct.    PLAN OF CARE: Admit for overnight observation  PATIENT DISPOSITION:  PACU - hemodynamically stable.   Delay start of Pharmacological VTE agent (>24hrs) due to surgical blood loss or risk of bleeding:  yes

## 2023-07-17 ENCOUNTER — Encounter (HOSPITAL_COMMUNITY): Payer: Self-pay | Admitting: Neurological Surgery

## 2023-07-17 DIAGNOSIS — Z79899 Other long term (current) drug therapy: Secondary | ICD-10-CM | POA: Diagnosis not present

## 2023-07-17 DIAGNOSIS — Z950 Presence of cardiac pacemaker: Secondary | ICD-10-CM | POA: Diagnosis not present

## 2023-07-17 DIAGNOSIS — Z96653 Presence of artificial knee joint, bilateral: Secondary | ICD-10-CM | POA: Diagnosis not present

## 2023-07-17 DIAGNOSIS — Z7901 Long term (current) use of anticoagulants: Secondary | ICD-10-CM | POA: Diagnosis not present

## 2023-07-17 DIAGNOSIS — I10 Essential (primary) hypertension: Secondary | ICD-10-CM | POA: Diagnosis not present

## 2023-07-17 DIAGNOSIS — I4891 Unspecified atrial fibrillation: Secondary | ICD-10-CM | POA: Diagnosis not present

## 2023-07-17 DIAGNOSIS — M48061 Spinal stenosis, lumbar region without neurogenic claudication: Secondary | ICD-10-CM | POA: Diagnosis not present

## 2023-07-17 MED ORDER — CYCLOBENZAPRINE HCL 10 MG PO TABS: 10.0000 mg | ORAL_TABLET | Freq: Three times a day (TID) | ORAL | 0 refills | Status: DC | PRN

## 2023-07-17 MED ORDER — HYDROCODONE-ACETAMINOPHEN 5-325 MG PO TABS: 1.0000 | ORAL_TABLET | ORAL | 0 refills | Status: AC | PRN

## 2023-07-17 NOTE — TOC Transition Note (Signed)
Transition of Care [facility]) - CM/SW Discharge Note   Patient Details  Name: [name] MRN: [id number] Date of Birth: 1931/10/17  Transition of Care [facility]) CM/SW Contact:  Tom-Johnson, Hershal Coria, RN Phone Number: 07/17/2023, 1:02 PM   Clinical Narrative:     Patient is scheduled for discharge today.  Hospital f/u and discharge instructions on AVS. No TOC needs or recommendations noted. No PT/OT f/u noted.  Family to transport at discharge.  No further TOC needs noted.       Final next level of care: Home/Self Care Barriers to Discharge: Barriers Resolved   Patient Goals and CMS Choice CMS Medicare.gov Compare Post Acute Care list provided to:: Patient Choice offered to / list presented to : NA  Discharge Placement                  Patient to be transferred to facility by: Spouse Name of family member notified: [facility]    Discharge Plan and Services Additional resources added to the After Visit Summary for                  DME Arranged: N/A DME Agency: NA       HH Arranged: NA HH Agency: NA        Social Determinants of Health (SDOH) Interventions SDOH Screenings   Food Insecurity: No Food Insecurity (11/21/2017)  Transportation Needs: No Transportation Needs (11/21/2017)  Alcohol Screen: Low Risk  (01/06/2019)  Depression (PHQ2-9): Low Risk  (06/15/2023)  Financial Resource Strain: Low Risk  (11/21/2017)  Physical Activity: Sufficiently Active (11/21/2017)  Social Connections: Socially Integrated (11/21/2017)  Stress: Stress Concern Present (11/21/2017)  Tobacco Use: Low Risk  (07/16/2023)     Readmission Risk Interventions     No data to display

## 2023-07-17 NOTE — Care Management Obs Status (Signed)
MEDICARE OBSERVATION STATUS NOTIFICATION   Patient Details  Name: [name] MRN: [id number] Date of Birth: [birth year]   Medicare Observation Status Notification Given:  Yes    Tom-Johnson, Hershal Coria, RN 07/17/2023, 9:34 AM

## 2023-07-17 NOTE — Progress Notes (Signed)
Patient alert and oriented, mae's well, voiding adequate amount of urine, swallowing without difficulty, no c/o pain at time of discharge. Patient discharged home with family. Script and discharged instructions given to patient. Patient and family stated understanding of instructions given. Patient has an appointment with Dr. Jones in 2 weeks ?

## 2023-07-17 NOTE — Evaluation (Signed)
Occupational Therapy Evaluation Patient Details Name: [name] MRN: [id number] DOB: 23-Feb-1931 Today's Date: 07/17/2023   History of Present Illness 89 y.o. female presents to [facility] hospital on 07/16/2023 for L3-5 decompression. PMH includes Afib, anxiety, OA, GERD, PPM, HTN.   Clinical Impression   Patient evaluated by Occupational Therapy with no further acute OT needs identified. All education has been completed and the patient has no further questions. See below for any follow-up Occupational Therapy or equipment needs. OT to sign off. Thank you for referral.        Recommendations for follow up therapy are one component of a multi-disciplinary discharge planning process, led by the attending physician.  Recommendations may be updated based on patient status, additional functional criteria and insurance authorization.   Assistance Recommended at Discharge PRN  Patient can return home with the following A little help with bathing/dressing/bathroom;Assist for transportation    Functional Status Assessment  Patient has had a recent decline in their functional status and demonstrates the ability to make significant improvements in function in a reasonable and predictable amount of time.  Equipment Recommendations  None recommended by OT    Recommendations for Other Services       Precautions / Restrictions Precautions Precautions: Back;Fall Precaution Booklet Issued: Yes (comment) Precaution Comments: handout provided and reviewed for adls. Required Braces or Orthoses:  (no brace needed) Restrictions Weight Bearing Restrictions: No      Mobility Bed Mobility               General bed mobility comments: oob on arrival standing at bed with suitcase open    Transfers Overall transfer level: Needs assistance Equipment used: Rolling walker (2 wheels) Transfers: Sit to/from Stand Sit to Stand: Supervision           General transfer comment: increasedtime and use  of bil UE      Balance Overall balance assessment: Needs assistance Sitting-balance support: No upper extremity supported, Feet supported Sitting balance-Leahy Scale: Good     Standing balance support: Single extremity supported, Reliant on assistive device for balance Standing balance-Leahy Scale: Poor                             ADL either performed or assessed with clinical judgement   ADL Overall ADL's : Needs assistance/impaired Eating/Feeding: Independent   Grooming: Independent           Upper Body Dressing : Supervision/safety   Lower Body Dressing: Supervision/safety Lower Body Dressing Details (indicate cue type and reason): using reacher at times at home as needed. pt has reacher but she would prefer to have two of them because she likes to use the reacher for laundry instead. Toilet Transfer: Supervision/safety           Functional mobility during ADLs: Supervision/safety General ADL Comments: pt needs cues for RW placment and safety. pt reports feeling dizzy x3 during session but reports "its much better than this morning"  Back handout provided and reviewed adls in detail. Pt educated on:  set an alarm at night for medication, avoid sitting for long periods of time, correct bed positioning for sleeping, correct sequence for bed mobility, avoiding lifting more than 5 pounds and never wash directly over incision. All education is complete and patient indicates understanding.    Vision Baseline Vision/History: 1 Wears glasses Ability to See in Adequate Light: 0 Adequate Patient Visual Report: No change from baseline Vision Assessment?: No  apparent visual deficits     Perception     Praxis      Pertinent Vitals/Pain Pain Assessment Pain Assessment: 0-10 Pain Score: 5  Pain Location: back Pain Descriptors / Indicators: Sore Pain Intervention(s): Monitored during session, Premedicated before session, Repositioned     Hand Dominance  Right   Extremity/Trunk Assessment Upper Extremity Assessment Upper Extremity Assessment: Overall WFL for tasks assessed   Lower Extremity Assessment Lower Extremity Assessment: Defer to PT evaluation RLE Deficits / Details: pt reports decreased sensation to lateral RLE, improvement compared to pre-surgery per patient RLE Sensation: decreased light touch   Cervical / Trunk Assessment Cervical / Trunk Assessment: Back Surgery   Communication Communication Communication: No difficulties   Cognition Arousal/Alertness: Awake/alert Behavior During Therapy: WFL for tasks assessed/performed Overall Cognitive Status: Within Functional Limits for tasks assessed                                       General Comments  VSS on RA, Rn in room at end of session and notified of continued dizziness    Exercises     Shoulder Instructions      Home Living Family/patient expects to be discharged to:: Private residence Living Arrangements: Spouse/significant other Available Help at Discharge: Family;Available 24 hours/day Type of Home: House Home Access: Stairs to enter [employer] of Steps: 4 Entrance Stairs-Rails: Can reach both Home Layout: Two level Alternate Level Stairs-Number of Steps: chair lift followed by 4 steps Alternate Level Stairs-Rails: Can reach both           Home Equipment: Rolling Walker (2 wheels);Cane - single point   Additional Comments: no animals      Prior Functioning/Environment Prior Level of Function : Independent/Modified Independent             Mobility Comments: recently ambulating with SPC vs RW due to RLE numbness          OT Problem List:        OT Treatment/Interventions:      OT Goals(Current goals can be found in the care plan section) Acute Rehab OT Goals Patient Stated Goal: to go home today Potential to Achieve Goals: Good  OT Frequency:      Co-evaluation              AM-PAC OT "6 Clicks"  Daily Activity     Outcome Measure Help from another person eating meals?: None Help from another person taking care of personal grooming?: None Help from another person toileting, which includes using toliet, bedpan, or urinal?: None Help from another person bathing (including washing, rinsing, drying)?: A Little Help from another person to put on and taking off regular upper body clothing?: None Help from another person to put on and taking off regular lower body clothing?: A Little 6 Click Score: 22   End of Session Nurse Communication: Mobility status  Activity Tolerance: Patient tolerated treatment well Patient left: in chair;with nursing/sitter in room;with family/visitor present  OT Visit Diagnosis: Unsteadiness on feet (R26.81)                Time: 6606-3016 OT Time Calculation (min): 16 min Charges:  OT General Charges $OT Visit: 1 Visit OT Evaluation $OT Eval Moderate Complexity: 1 Mod   Brynn, OTR/L  Acute Rehabilitation Services Office: [phone]. Mateo Flow 07/17/2023, 11:26 AM

## 2023-07-17 NOTE — Discharge Summary (Signed)
Physician Discharge Summary  Patient ID: [name] MRN: [id number] DOB/AGE: Jun 04, 1931 89 y.o.  Admit date: 07/16/2023 Discharge date: 07/17/2023  Admission Diagnoses: Lumbar spinal stenosis L3-4 L4-5 with right leg pain with numbness and weakness     Discharge Diagnoses: same   Discharged Condition: good  Hospital Course: The patient was admitted on 07/16/2023 and taken to the operating room where the patient underwent lumbar laminectomy L3-4, L4-5. The patient tolerated the procedure well and was taken to the recovery room and then to the floor in stable condition. The hospital course was routine. There were no complications. The wound remained clean dry and intact. Pt had appropriate back soreness. No complaints of leg pain or new N/T/W. The patient remained afebrile with stable vital signs, and tolerated a regular diet. The patient continued to increase activities, and pain was well controlled with oral pain medications.   Consults: None  Significant Diagnostic Studies:  Results for orders placed or performed during the hospital encounter of 07/06/23  Surgical pcr screen   Specimen: Nasal Mucosa; Nasal Swab  Result Value Ref Range   MRSA, PCR NEGATIVE NEGATIVE   Staphylococcus aureus NEGATIVE NEGATIVE  Protime-INR  Result Value Ref Range   Prothrombin Time 15.3 (H) 11.4 - 15.2 seconds   INR 1.2 0.8 - 1.2  Basic metabolic panel per protocol  Result Value Ref Range   Sodium 139 135 - 145 mmol/L   Potassium 3.8 3.5 - 5.1 mmol/L   Chloride 101 98 - 111 mmol/L   CO2 30 22 - 32 mmol/L   Glucose, Bld 103 (H) 70 - 99 mg/dL   BUN 15 8 - 23 mg/dL   Creatinine, Ser 0.27 0.44 - 1.00 mg/dL   Calcium 9.1 8.9 - 25.3 mg/dL   GFR, Estimated >66 >44 mL/min   Anion gap 8 5 - 15  CBC per protocol  Result Value Ref Range   WBC 6.3 4.0 - 10.5 K/uL   RBC 4.18 3.87 - 5.11 MIL/uL   Hemoglobin 13.0 12.0 - 15.0 g/dL   HCT 03.4 74.2 - 59.5 %   MCV 95.5 80.0 - 100.0 fL   MCH 31.1 26.0 -  34.0 pg   MCHC 32.6 30.0 - 36.0 g/dL   RDW 63.8 75.6 - 43.3 %   Platelets 166 150 - 400 K/uL   nRBC 0.0 0.0 - 0.2 %    DG Lumbar Spine 2-3 Views  Result Date: 07/16/2023 CLINICAL DATA:  Elective surgery. EXAM: LUMBAR SPINE - 2-3 VIEW COMPARISON:  Preoperative imaging demonstrated transitional lumbosacral anatomy. FINDINGS: Three lateral fluoroscopic spot views of the lumbar spine obtained in the operating room. The lower most disc space will be assumed to be S1-S2 as on prior imaging. Image 1 demonstrates surgical instruments posterior to the L4 level. Image 2 demonstrates surgical instruments posterior to the L5 and S1 level. Image 3 demonstrates surgical instruments posterior to the L4 and L5 level. IMPRESSION: Intraoperative spot views during lumbar surgery. Electronically Signed   By: Narda Rutherford M.D.   On: 07/16/2023 15:37   CUP PACEART REMOTE DEVICE CHECK  Result Date: 06/19/2023 Scheduled remote reviewed. Normal device function.  Next remote 91 days. - C. Kavin Leech, RN, CVRS   Antibiotics:  Anti-infectives (From admission, onward)    Start     Dose/Rate Route Frequency Ordered Stop   07/16/23 2000  ceFAZolin (ANCEF) IVPB 2g/100 mL premix        2 g 200 mL/hr over 30 Minutes Intravenous Every 8 hours  07/16/23 1714 07/17/23 0437   07/16/23 0930  ceFAZolin (ANCEF) IVPB 2g/100 mL premix        2 g 200 mL/hr over 30 Minutes Intravenous On call to O.R. 07/16/23 0925 07/16/23 1239       Discharge Exam: Blood pressure (!) 130/57, pulse 65, temperature 98.7 F (37.1 C), temperature source Oral, resp. rate 16, height 5\' 2"  (1.575 m), weight 82.1 kg, SpO2 96%. Neurologic: Grossly normal Ambulating and voiding well incision cdi   Discharge Medications:   Allergies as of 07/17/2023       Reactions   Advil [ibuprofen] Other (See Comments)   GI upset   Aleve [naproxen Sodium] Other (See Comments)   GI upset   Aspirin Other (See Comments)   Heartburn/indigestion.   Atorvastatin  Other (See Comments)   Leg cramps   Celebrex [celecoxib] Other (See Comments)   GI upset   Diclofenac    GI upset   Doxycycline Other (See Comments)   Headache   Nsaids Other (See Comments)   Tears my stomach up   Other    Antihistamine - increase BP   Pravastatin Other (See Comments)   Leg cramps   Timolol Other (See Comments)   Dropped HR   Metoclopramide Hcl Palpitations        Medication List     STOP taking these medications    Eliquis 5 MG Tabs tablet Generic drug: apixaban   traMADol 50 MG tablet Commonly known as: ULTRAM       TAKE these medications    acetaminophen 500 MG tablet Commonly known as: TYLENOL Take 500-1,000 mg by mouth every 6 (six) hours as needed for moderate pain.   ACT Dry Mouth Lozg Use as directed 1 drop in the mouth or throat as needed (dry mouth).   ADULT SUPPOSITORY RE Place 1 suppository rectally daily as needed (constipation.).   antiseptic oral rinse Liqd 15 mLs by Mouth Rinse route 2 (two) times daily.   ASPERCREME PAIN RELIEF PATCH EX Place 1 patch onto the skin daily as needed (pain.).   bimatoprost 0.01 % Soln Commonly known as: LUMIGAN Place 1 drop into both eyes at bedtime.   Biotene Dry Mouth Gentle Pste Place 1 Application onto teeth in the morning and at bedtime.   CALCIUM + D PO Take 1 tablet by mouth in the morning.   cyanocobalamin 1000 MCG tablet Commonly known as: VITAMIN B12 Take 1,000 mcg by mouth in the morning.   cyclobenzaprine 10 MG tablet Commonly known as: FLEXERIL Take 1 tablet (10 mg total) by mouth 3 (three) times daily as needed for muscle spasms.   dorzolamide 2 % ophthalmic solution Commonly known as: TRUSOPT Place 1 drop into both eyes 3 (three) times daily.   esomeprazole 20 MG capsule Commonly known as: NEXIUM TAKE 1 CAPSULE (20 MG TOTAL) BY MOUTH AS NEEDED.   ezetimibe 10 MG tablet Commonly known as: ZETIA TAKE ONE TABLET BY MOUTH ONCE DAILY   HYDROcodone-acetaminophen  5-325 MG tablet Commonly known as: NORCO/VICODIN Take 1 tablet by mouth every 4 (four) hours as needed for moderate pain.   hydroquinone 4 % cream Apply topically daily. What changed:  how much to take when to take this   MAGNESIUM EXTRA STRENGTH PO Apply 1 Application topically as needed (knee pain.). MAGNESIUM OIL   methylcellulose 1 % ophthalmic solution Commonly known as: ARTIFICIAL TEARS Place 1 drop into both eyes 3 (three) times daily as needed (for dry eyes).   metoprolol  tartrate 25 MG tablet Commonly known as: LOPRESSOR TAKE 1 TABLET BY MOUTH TWICE A DAY   OVER THE COUNTER MEDICATION Take 1 capsule by mouth 2 (two) times daily. HydroEye Softgels Dry Eye Relief   propafenone 225 MG 12 hr capsule Commonly known as: RYTHMOL SR TAKE 1 CAPSULE BY MOUTH TWICE A DAY   sodium chloride 0.65 % Soln nasal spray Commonly known as: OCEAN Place 1 spray into both nostrils 2 (two) times daily as needed (dry nasal passages). [employer] Saline Nasal Moisturizing Spray   STOOL SOFTENER PO Take 100 mg by mouth 3 (three) times a week. At night   traZODone 50 MG tablet Commonly known as: DESYREL TAKE 1 TABLET BY MOUTH EVERYDAY AT BEDTIME What changed: See the new instructions.   valsartan-hydrochlorothiazide 320-25 MG tablet Commonly known as: DIOVAN-HCT TAKE ONE TABLET BY MOUTH DAILY   Vitamin D3 Super Strength 50 MCG (2000 UT) tablet Generic drug: Cholecalciferol Take 2,000 Units by mouth daily.        Disposition: home   Final Dx: Lumbar Laminectomy L3-4, L4-5  Discharge Instructions      Remove dressing in 72 hours   Complete by: As directed    Call MD for:  difficulty breathing, headache or visual disturbances   Complete by: As directed    Call MD for:  hives   Complete by: As directed    Call MD for:  persistant dizziness or light-headedness   Complete by: As directed    Call MD for:  persistant nausea and vomiting   Complete by: As directed    Call MD for:   redness, tenderness, or signs of infection (pain, swelling, redness, odor or green/yellow discharge around incision site)   Complete by: As directed    Call MD for:  severe uncontrolled pain   Complete by: As directed    Call MD for:  temperature >100.4   Complete by: As directed    Diet - low sodium heart healthy   Complete by: As directed    Driving Restrictions   Complete by: As directed    No driving for 2 weeks, no riding in the car for 1 week   Increase activity slowly   Complete by: As directed    Lifting restrictions   Complete by: As directed    No lifting more than 8 lbs          Signed: Tiana Loft Latresa Gasser 07/17/2023, 12:43 PM

## 2023-07-17 NOTE — Progress Notes (Signed)
Patient ID: [name], female   DOB: 1931-11-10, 89 y.o.   MRN: [id number] Has been flat. Some headache relieved by tylenol. Back and legs aching from lying in bed. Moves legs well.  D/c foley, mobilize today. If does well may go home later today

## 2023-07-17 NOTE — Evaluation (Signed)
Physical Therapy Evaluation Patient Details Name: [name] MRN: [id number] DOB: 14-Jul-1931 Today's Date: 07/17/2023  History of Present Illness  89 y.o. female presents to [facility] hospital on 07/16/2023 for L3-5 decompression. PMH includes Afib, anxiety, OA, GERD, PPM, HTN.  Clinical Impression  Pt presents to PT with deficits in strength, power, endurance, gait. Pt is able to ambulate for household distances and negotiates stairs with verbal cues for technique. Pt appears to ambulate with improved quality with increased distances, reporting less significant soreness. Pt will benefit from continued frequent mobilization in an effort to restore independence. PT recommends discharge home when medically appropriate.        Assistance Recommended at Discharge Intermittent Supervision/Assistance  If plan is discharge home, recommend the following:  Can travel by private vehicle  A little help with walking and/or transfers;A little help with bathing/dressing/bathroom;Assistance with cooking/housework;Assist for transportation;Help with stairs or ramp for entrance        Equipment Recommendations None recommended by PT  Recommendations for Other Services       Functional Status Assessment Patient has had a recent decline in their functional status and demonstrates the ability to make significant improvements in function in a reasonable and predictable amount of time.     Precautions / Restrictions Precautions Precautions: Back;Fall Precaution Booklet Issued: Yes (comment) Required Braces or Orthoses:  (no brace needed) Restrictions Weight Bearing Restrictions: No      Mobility  Bed Mobility Overal bed mobility: Needs Assistance Bed Mobility: Rolling, Sidelying to Sit Rolling: Min guard Sidelying to sit: Min guard       General bed mobility comments: increased time, use of rails    Transfers Overall transfer level: Needs assistance Equipment used: Rolling walker (2  wheels) Transfers: Sit to/from Stand Sit to Stand: Min guard, Supervision           General transfer comment: increased time, vebal cues for hand placement    Ambulation/Gait Ambulation/Gait assistance: Supervision Gait Distance (Feet): 200 Feet Assistive device: Rolling walker (2 wheels) Gait Pattern/deviations: Step-through pattern Gait velocity: reduced Gait velocity interpretation: <1.8 ft/sec, indicate of risk for recurrent falls   General Gait Details: slowed step-through gait, reduced stride length  Stairs Stairs: Yes Stairs assistance: Min guard Stair Management: Two rails, Step to pattern Number of Stairs: 3 General stair comments: cues to ascend with LLE and descend with RLE  Wheelchair Mobility     Tilt Bed    Modified Rankin (Stroke Patients Only)       Balance Overall balance assessment: Needs assistance Sitting-balance support: No upper extremity supported, Feet supported Sitting balance-Leahy Scale: Good     Standing balance support: Single extremity supported, Reliant on assistive device for balance Standing balance-Leahy Scale: Poor                               Pertinent Vitals/Pain Pain Assessment Pain Assessment: 0-10 Pain Score: 5  Pain Location: back Pain Descriptors / Indicators: Sore Pain Intervention(s): Monitored during session    Home Living Family/patient expects to be discharged to:: Private residence Living Arrangements: Spouse/significant other Available Help at Discharge: Family;Available 24 hours/day Type of Home: House Home Access: Stairs to enter Entrance Stairs-Rails: Can reach both Entrance Stairs-Number of Steps: 4 Alternate Level Stairs-Number of Steps: chair lift followed by 4 steps Home Layout: Two level Home Equipment: Agricultural consultant (2 wheels);Cane - single point      Prior Function Prior Level of Function :  Independent/Modified Independent             Mobility Comments: recently  ambulating with SPC vs RW due to RLE numbness       Hand Dominance        Extremity/Trunk Assessment   Upper Extremity Assessment Upper Extremity Assessment: Overall WFL for tasks assessed    Lower Extremity Assessment Lower Extremity Assessment: Generalized weakness;RLE deficits/detail RLE Deficits / Details: pt reports decreased sensation to lateral RLE, improvement compared to pre-surgery per patient RLE Sensation: decreased light touch    Cervical / Trunk Assessment Cervical / Trunk Assessment: Back Surgery  Communication   Communication: No difficulties  Cognition Arousal/Alertness: Awake/alert Behavior During Therapy: WFL for tasks assessed/performed Overall Cognitive Status: Within Functional Limits for tasks assessed                                          General Comments General comments (skin integrity, edema, etc.): VSS on RA    Exercises     Assessment/Plan    PT Assessment Patient needs continued PT services  PT Problem List Decreased activity tolerance;Decreased balance;Decreased strength;Decreased mobility;Decreased knowledge of precautions       PT Treatment Interventions DME instruction;Gait training;Functional mobility training;Therapeutic activities;Therapeutic exercise;Balance training;Neuromuscular re-education;Patient/family education;Stair training    PT Goals (Current goals can be found in the Care Plan section)  Acute Rehab PT Goals Patient Stated Goal: to go home PT Goal Formulation: With patient Time For Goal Achievement: 07/22/23 Potential to Achieve Goals: Good    Frequency Min 5X/week     Co-evaluation               AM-PAC PT "6 Clicks" Mobility  Outcome Measure Help needed turning from your back to your side while in a flat bed without using bedrails?: A Little Help needed moving from lying on your back to sitting on the side of a flat bed without using bedrails?: A Little Help needed moving to and  from a bed to a chair (including a wheelchair)?: A Little Help needed standing up from a chair using your arms (e.g., wheelchair or bedside chair)?: A Little Help needed to walk in hospital room?: A Little Help needed climbing 3-5 steps with a railing? : A Little 6 Click Score: 18    End of Session   Activity Tolerance: Patient tolerated treatment well Patient left: in bed;with call bell/phone within reach Nurse Communication: Mobility status PT Visit Diagnosis: Other abnormalities of gait and mobility (R26.89);Muscle weakness (generalized) (M62.81);Pain Pain - part of body:  (back)    Time: 2595-6387 PT Time Calculation (min) (ACUTE ONLY): 33 min   Charges:   PT Evaluation $PT Eval Low Complexity: 1 Low   PT General Charges $$ ACUTE PT VISIT: 1 Visit         Arlyss Gandy, PT, DPT Acute Rehabilitation Office [phone]   Arlyss Gandy 07/17/2023, 10:41 AM

## 2023-07-18 ENCOUNTER — Telehealth: Payer: Self-pay

## 2023-07-18 NOTE — Telephone Encounter (Signed)
Spoke to patient Dr.Jordan advised ok to resume Eliquis.

## 2023-07-18 NOTE — Telephone Encounter (Signed)
Received a call from patient.She had back surgery Mon 7/22.She was discharged last night 7/23.Stated she has been off Eliquis 6 days.Stated she has not had any bleeding.She was told to ask Dr.Jordan if ok to restart Eliquis.I will send message to Dr.Jordan for advice.

## 2023-07-24 ENCOUNTER — Ambulatory Visit: Payer: Medicare HMO | Admitting: Cardiology

## 2023-08-06 ENCOUNTER — Inpatient Hospital Stay: Admission: RE | Admit: 2023-08-06 | Payer: Medicare HMO | Source: Ambulatory Visit

## 2023-08-06 DIAGNOSIS — E349 Endocrine disorder, unspecified: Secondary | ICD-10-CM | POA: Diagnosis not present

## 2023-08-06 DIAGNOSIS — N950 Postmenopausal bleeding: Secondary | ICD-10-CM | POA: Diagnosis not present

## 2023-08-06 DIAGNOSIS — M8588 Other specified disorders of bone density and structure, other site: Secondary | ICD-10-CM | POA: Diagnosis not present

## 2023-08-06 DIAGNOSIS — E2839 Other primary ovarian failure: Secondary | ICD-10-CM

## 2023-09-10 DIAGNOSIS — M5416 Radiculopathy, lumbar region: Secondary | ICD-10-CM | POA: Diagnosis not present

## 2023-09-10 DIAGNOSIS — M545 Low back pain, unspecified: Secondary | ICD-10-CM | POA: Diagnosis not present

## 2023-09-17 DIAGNOSIS — M545 Low back pain, unspecified: Secondary | ICD-10-CM | POA: Diagnosis not present

## 2023-09-17 DIAGNOSIS — M5416 Radiculopathy, lumbar region: Secondary | ICD-10-CM | POA: Diagnosis not present

## 2023-09-18 ENCOUNTER — Ambulatory Visit (INDEPENDENT_AMBULATORY_CARE_PROVIDER_SITE_OTHER): Payer: Medicare HMO

## 2023-09-18 DIAGNOSIS — I495 Sick sinus syndrome: Secondary | ICD-10-CM

## 2023-09-18 DIAGNOSIS — I480 Paroxysmal atrial fibrillation: Secondary | ICD-10-CM

## 2023-09-18 LAB — CUP PACEART REMOTE DEVICE CHECK
Battery Remaining Longevity: 32 mo
Battery Remaining Percentage: 32 %
Battery Voltage: 2.93 V
Brady Statistic AP VP Percent: 5.4 %
Brady Statistic AP VS Percent: 94 %
Brady Statistic AS VP Percent: 1 %
Brady Statistic AS VS Percent: 1 %
Brady Statistic RA Percent Paced: 99 %
Brady Statistic RV Percent Paced: 5.8 %
Date Time Interrogation Session: 20240924020020
Implantable Lead Connection Status: 753985
Implantable Lead Connection Status: 753985
Implantable Lead Implant Date: 20170628
Implantable Lead Implant Date: 20170628
Implantable Lead Location: 753859
Implantable Lead Location: 753860
Implantable Pulse Generator Implant Date: 20170628
Lead Channel Impedance Value: 490 Ohm
Lead Channel Impedance Value: 550 Ohm
Lead Channel Pacing Threshold Amplitude: 0.75 V
Lead Channel Pacing Threshold Amplitude: 1.5 V
Lead Channel Pacing Threshold Pulse Width: 0.5 ms
Lead Channel Pacing Threshold Pulse Width: 0.5 ms
Lead Channel Sensing Intrinsic Amplitude: 1.4 mV
Lead Channel Sensing Intrinsic Amplitude: 9.1 mV
Lead Channel Setting Pacing Amplitude: 2 V
Lead Channel Setting Pacing Amplitude: 3 V
Lead Channel Setting Pacing Pulse Width: 0.5 ms
Lead Channel Setting Sensing Sensitivity: 2 mV
Pulse Gen Model: 2272
Pulse Gen Serial Number: 7910313

## 2023-09-20 DIAGNOSIS — M545 Low back pain, unspecified: Secondary | ICD-10-CM | POA: Diagnosis not present

## 2023-09-20 DIAGNOSIS — M5416 Radiculopathy, lumbar region: Secondary | ICD-10-CM | POA: Diagnosis not present

## 2023-09-24 DIAGNOSIS — M545 Low back pain, unspecified: Secondary | ICD-10-CM | POA: Diagnosis not present

## 2023-09-24 DIAGNOSIS — M5416 Radiculopathy, lumbar region: Secondary | ICD-10-CM | POA: Diagnosis not present

## 2023-09-27 DIAGNOSIS — M545 Low back pain, unspecified: Secondary | ICD-10-CM | POA: Diagnosis not present

## 2023-09-27 DIAGNOSIS — M5416 Radiculopathy, lumbar region: Secondary | ICD-10-CM | POA: Diagnosis not present

## 2023-10-01 DIAGNOSIS — M5416 Radiculopathy, lumbar region: Secondary | ICD-10-CM | POA: Diagnosis not present

## 2023-10-01 DIAGNOSIS — M545 Low back pain, unspecified: Secondary | ICD-10-CM | POA: Diagnosis not present

## 2023-10-04 DIAGNOSIS — M545 Low back pain, unspecified: Secondary | ICD-10-CM | POA: Diagnosis not present

## 2023-10-04 DIAGNOSIS — M5416 Radiculopathy, lumbar region: Secondary | ICD-10-CM | POA: Diagnosis not present

## 2023-10-04 NOTE — Progress Notes (Signed)
Remote pacemaker transmission.   

## 2023-10-08 DIAGNOSIS — M5416 Radiculopathy, lumbar region: Secondary | ICD-10-CM | POA: Diagnosis not present

## 2023-10-08 DIAGNOSIS — M545 Low back pain, unspecified: Secondary | ICD-10-CM | POA: Diagnosis not present

## 2023-10-09 ENCOUNTER — Other Ambulatory Visit: Payer: Self-pay | Admitting: Nurse Practitioner

## 2023-10-11 DIAGNOSIS — M545 Low back pain, unspecified: Secondary | ICD-10-CM | POA: Diagnosis not present

## 2023-10-11 DIAGNOSIS — M5416 Radiculopathy, lumbar region: Secondary | ICD-10-CM | POA: Diagnosis not present

## 2023-10-15 ENCOUNTER — Ambulatory Visit (INDEPENDENT_AMBULATORY_CARE_PROVIDER_SITE_OTHER): Payer: Medicare HMO | Admitting: Nurse Practitioner

## 2023-10-15 ENCOUNTER — Encounter: Payer: Self-pay | Admitting: Nurse Practitioner

## 2023-10-15 VITALS — BP 132/80 | HR 65 | Temp 95.1°F | Ht 62.0 in | Wt 183.4 lb

## 2023-10-15 DIAGNOSIS — E785 Hyperlipidemia, unspecified: Secondary | ICD-10-CM

## 2023-10-15 DIAGNOSIS — M79603 Pain in arm, unspecified: Secondary | ICD-10-CM | POA: Diagnosis not present

## 2023-10-15 DIAGNOSIS — I480 Paroxysmal atrial fibrillation: Secondary | ICD-10-CM

## 2023-10-15 DIAGNOSIS — I10 Essential (primary) hypertension: Secondary | ICD-10-CM

## 2023-10-15 DIAGNOSIS — M79606 Pain in leg, unspecified: Secondary | ICD-10-CM

## 2023-10-15 DIAGNOSIS — F5101 Primary insomnia: Secondary | ICD-10-CM | POA: Diagnosis not present

## 2023-10-15 DIAGNOSIS — Z23 Encounter for immunization: Secondary | ICD-10-CM

## 2023-10-15 DIAGNOSIS — M48062 Spinal stenosis, lumbar region with neurogenic claudication: Secondary | ICD-10-CM | POA: Diagnosis not present

## 2023-10-15 DIAGNOSIS — M810 Age-related osteoporosis without current pathological fracture: Secondary | ICD-10-CM

## 2023-10-15 DIAGNOSIS — K219 Gastro-esophageal reflux disease without esophagitis: Secondary | ICD-10-CM | POA: Diagnosis not present

## 2023-10-15 MED ORDER — ESOMEPRAZOLE MAGNESIUM 20 MG PO CPDR: 20.0000 mg | DELAYED_RELEASE_CAPSULE | ORAL | 0 refills | Status: DC | PRN

## 2023-10-15 NOTE — Progress Notes (Unsigned)
Careteam: Patient Care Team: Sharon Seller, NP as PCP - General (Geriatric Medicine) [place], Peter M, MD as PCP - Cardiology (Cardiology) Kathryne Hitch, MD as Attending Physician (Orthopedic Surgery) Laurey Morale, MD as Attending Physician (Cardiology) Keturah Barre, MD as Consulting Physician (Otolaryngology) Cherlyn Roberts, MD as Consulting Physician (Dermatology) Rennis Golden Lisette Abu, MD as Consulting Physician (Cardiology) Arman Bogus, MD as Consulting Physician (Neurosurgery) Shon Millet, MD as Consulting Physician (Ophthalmology) Yetta Barre Thomes Dinning, MD as Consulting Physician (Neurosurgery)  PLACE OF SERVICE:  [facility]  Advanced Directive information    Allergies  Allergen Reactions   Advil [Ibuprofen] Other (See Comments)    GI upset   Aleve [Naproxen Sodium] Other (See Comments)    GI upset   Aspirin Other (See Comments)    Heartburn/indigestion.   Atorvastatin Other (See Comments)    Leg cramps   Celebrex [Celecoxib] Other (See Comments)    GI upset   Diclofenac     GI upset   Doxycycline Other (See Comments)    Headache   Nsaids Other (See Comments)    Tears my stomach up    Other     Antihistamine - increase BP   Pravastatin Other (See Comments)    Leg cramps   Timolol Other (See Comments)    Dropped HR   Metoclopramide Hcl Palpitations    Chief Complaint  Patient presents with   Medical Management of Chronic Issues    Patient presents today for 4 month follow-up   Quality Metric Gaps    COVID#4,Flu     HPI: Patient is a 89 y.o. female presents for 4 month follow-up.  Had back/spine surgery in July by Dr. Yetta Barre. Doing PT 2x/week. She is slowing making progress and recovering from surgery   Has been feeling dizzy, accompanied with shortness of breath and palpitations like a "flutter". Has pacemaker, had pacemaker check in September.   Had a fall on Saturday 10/19, while getting out of the shower, states her  right leg was tired after driving. She is still having right shoulder/leg soreness, she hit herself on the shower doors. Took PRN Tylenol, tried heat as well, has helped some.   Having more headaches for about 6 weeks. Reports having seasonal allergies and saline nasal spray for congestion.   Having some indigestion/acid reflux, requesting refill on Nexium.  Taking trazodone, 1/2 tab 5 nights/week  Review of Systems:  Review of Systems  Constitutional:  Negative for chills, fever and malaise/fatigue.  Eyes:  Negative for blurred vision.  Respiratory:  Positive for cough and shortness of breath.   Cardiovascular:  Positive for palpitations. Negative for chest pain and leg swelling.  Gastrointestinal:  Positive for constipation (uses OTC stool softeners 3x/week). Negative for diarrhea, nausea and vomiting.  Genitourinary:  Negative for dysuria, frequency and urgency.       Reports urine leakage  Musculoskeletal:  Positive for back pain and falls (latest 10/19). Negative for myalgias.  Neurological:  Positive for dizziness, weakness (back, from surgery) and headaches.       Some issues with balance; not needing to use cane/walker  Psychiatric/Behavioral:  Negative for depression. The patient has insomnia. The patient is not nervous/anxious.     Past Medical History:  Diagnosis Date   A-fib [facility])    Allergic rhinitis due to pollen    Anxiety state, unspecified    Sitautional- pain   Arthritis    "right knee" (06/21/2016)   Asymptomatic varicose veins  Carpal tunnel syndrome    Chest pain, unspecified    Chronic lower back pain    "on the left side" (06/21/2016)   Constipation    Cramp of limb    Depressive disorder, not elsewhere classified    pt denies   Diaphragmatic hernia without mention of obstruction or gangrene    Diaphragmatic hernia without mention of obstruction or gangrene    Disturbance of skin sensation    Diverticulosis of colon (without mention of hemorrhage)     Dysrhythmia    Atrial Flutter   Enthesopathy of hip region    Esophageal reflux    , just occasional   History of blood transfusion    "w/both knee replacements"   History of duodenal ulcer    History of kidney stones    passed   Insomnia, unspecified    Lumbago    Migraine    "none since ~ 1990" (06/21/2016)   Myalgia and myositis, unspecified    Obesity, unspecified    Other abnormal blood chemistry    Other dysphagia    Other malaise and fatigue    Other nonspecific abnormal serum enzyme levels    Other specified cardiac dysrhythmias(427.89)    Pacemaker    Pain in joint, ankle and foot    Pain in joint, hand    Pain in joint, lower leg    Pain in joint, pelvic region and thigh    Pain in limb    Plantar fascial fibromatosis    Presence of permanent cardiac pacemaker    -St Jude   Reflux esophagitis    Sciatica    Spinal stenosis, unspecified region other than cervical    Stricture and stenosis of esophagus    Symptomatic menopausal or female climacteric states    Unspecified essential hypertension    Unspecified essential hypertension    Unspecified vitamin D deficiency    Past Surgical History:  Procedure Laterality Date   BREAST BIOPSY Left 1990s X 2   CARDIOVERSION N/A 04/26/2016   Procedure: CARDIOVERSION;  Surgeon: Chrystie Nose, MD;  Location: [facility] ENDOSCOPY;  Service: Cardiovascular;  Laterality: N/A;   CARDIOVERSION N/A 12/04/2022   Procedure: CARDIOVERSION;  Surgeon: Parke Poisson, MD;  Location: [facility] ENDOSCOPY;  Service: Cardiovascular;  Laterality: N/A;   CATARACT EXTRACTION W/ INTRAOCULAR LENS IMPLANT Left 08/03/1999   DR EPES    CATARACT EXTRACTION W/ INTRAOCULAR LENS IMPLANT Right 2000   DR EPES   CHOLECYSTECTOMY OPEN  1989   DR BOWMAN   COLONOSCOPY  1988   Normal    DENTAL SURGERY Left 08/2016   EP IMPLANTABLE DEVICE N/A 06/21/2016   Procedure: Pacemaker Implant;  Surgeon: Marinus Maw, MD;  Location: [facility] INVASIVE CV LAB;  Service:  Cardiovascular;  Laterality: N/A;   ESOPHAGOGASTRODUODENOSCOPY (EGD) WITH ESOPHAGEAL DILATION  ~ 1982   Dr. Jarold Motto   EXCISION OF ACTINIC KERATOSIS     DR LUPTON    INSERT / REPLACE / REMOVE PACEMAKER  06/21/2016   St Jude   KNEE ARTHROSCOPY Left 2003   KNEE ARTHROSCOPY Right 06/26/2013   KNEE CLOSED REDUCTION Right 10/23/2013   Procedure: CLOSED MANIPULATION RIGHT KNEE;  Surgeon: Kathryne Hitch, MD;  Location: [facility] OR;  Service: Orthopedics;  Laterality: Right;   LASER FOR CLOUDY CAP LEFT EYE Left 03/2006   DR DIGBY   LUMBAR LAMINECTOMY/DECOMPRESSION MICRODISCECTOMY N/A 06/12/2019   Procedure: Laminectomy and Foraminotomy - Lumbar four-Lumbar five;  Surgeon: Tia Alert, MD;  Location: [facility] OR;  Service: Neurosurgery;  Laterality: N/A;   LUMBAR LAMINECTOMY/DECOMPRESSION MICRODISCECTOMY Bilateral 07/16/2023   Procedure: Right- - Lumbar three-Lumbar four - Lumbar four-Lumbar five Decompressive laminectomy;  Surgeon: Arman Bogus, MD;  Location: [facility] OR;  Service: Neurosurgery;  Laterality: Bilateral;   SKIN SURGERY  04/2022   [facility], Dr.Gray. Removed area on upper chest   TOTAL KNEE ARTHROPLASTY Left 04/2004   DR RENDALL   TOTAL KNEE ARTHROPLASTY Right 07/04/2013   Procedure: RIGHT TOTAL KNEE ARTHROPLASTY;  Surgeon: Kathryne Hitch, MD;  Location: [facility] ORS;  Service: Orthopedics;  Laterality: Right;   TRIGGER FINGER RELEASE Right 09/2018   VAGINAL HYSTERECTOMY  1979   Social History:   reports that she has never smoked. She has never used smokeless tobacco. She reports that she does not drink alcohol and does not use drugs.  Family History  Problem Relation Age of Onset   Ovarian cancer Mother    Uterine cancer Mother    Cerebrovascular Accident Mother    Heart disease Father    Hypertension Brother    Obesity Daughter     Medications: Patient's Medications  New Prescriptions   No medications on file  Previous Medications   ACETAMINOPHEN  (TYLENOL) 500 MG TABLET    Take 500-1,000 mg by mouth every 6 (six) hours as needed for moderate pain.   ANTISEPTIC ORAL RINSE (BIOTENE) LIQD    15 mLs by Mouth Rinse route 2 (two) times daily.   ARTIFICIAL SALIVA (ACT DRY MOUTH) LOZG    Use as directed 1 drop in the mouth or throat as needed (dry mouth).   BIMATOPROST (LUMIGAN) 0.01 % SOLN    Place 1 drop into both eyes at bedtime.    CALCIUM CITRATE-VITAMIN D (CALCIUM + D PO)    Take 1 tablet by mouth in the morning.   CAPSAICIN (ASPERCREME PAIN RELIEF PATCH EX)    Place 1 patch onto the skin daily as needed (pain.).   CHOLECALCIFEROL (VITAMIN D3 SUPER STRENGTH) 50 MCG (2000 UT) TABS    Take 2,000 Units by mouth daily.   CYANOCOBALAMIN (VITAMIN B12) 1000 MCG TABLET    Take 1,000 mcg by mouth in the morning.   CYCLOBENZAPRINE (FLEXERIL) 10 MG TABLET    Take 1 tablet (10 mg total) by mouth 3 (three) times daily as needed for muscle spasms.   DENTIFRICES (BIOTENE DRY MOUTH GENTLE) PSTE    Place 1 Application onto teeth in the morning and at bedtime.   DOCUSATE CALCIUM (STOOL SOFTENER PO)    Take 100 mg by mouth 3 (three) times a week. At night   DORZOLAMIDE (TRUSOPT) 2 % OPHTHALMIC SOLUTION    Place 1 drop into both eyes 3 (three) times daily.   EZETIMIBE (ZETIA) 10 MG TABLET    TAKE ONE TABLET BY MOUTH ONCE DAILY   GLYCERIN, LAXATIVE, (ADULT SUPPOSITORY RE)    Place 1 suppository rectally daily as needed (constipation.).   HYDROCODONE-ACETAMINOPHEN (NORCO/VICODIN) 5-325 MG TABLET    Take 1 tablet by mouth every 4 (four) hours as needed for moderate pain.   HYDROQUINONE 4 % CREAM    Apply topically daily.   MAGNESIUM OXIDE (MAGNESIUM EXTRA STRENGTH PO)    Apply 1 Application topically as needed (knee pain.). MAGNESIUM OIL   METHYLCELLULOSE (ARTIFICIAL TEARS) 1 % OPHTHALMIC SOLUTION    Place 1 drop into both eyes 3 (three) times daily as needed (for dry eyes).   METOPROLOL TARTRATE (LOPRESSOR) 25 MG TABLET    TAKE 1 TABLET BY  MOUTH TWICE A DAY    OVER THE COUNTER MEDICATION    Take 1 capsule by mouth 2 (two) times daily. HydroEye Softgels Dry Eye Relief   PROPAFENONE (RYTHMOL SR) 225 MG 12 HR CAPSULE    TAKE 1 CAPSULE BY MOUTH TWICE A DAY   SODIUM CHLORIDE (OCEAN) 0.65 % SOLN NASAL SPRAY    Place 1 spray into both nostrils 2 (two) times daily as needed (dry nasal passages). [employer] Saline Nasal Moisturizing Spray   TRAZODONE (DESYREL) 50 MG TABLET    TAKE 1 TABLET BY MOUTH EVERYDAY AT BEDTIME   VALSARTAN-HYDROCHLOROTHIAZIDE (DIOVAN-HCT) 320-25 MG TABLET    TAKE ONE TABLET BY MOUTH DAILY  Modified Medications   Modified Medication Previous Medication   ESOMEPRAZOLE (NEXIUM) 20 MG CAPSULE esomeprazole (NEXIUM) 20 MG capsule      Take 1 capsule (20 mg total) by mouth as needed.    TAKE 1 CAPSULE (20 MG TOTAL) BY MOUTH AS NEEDED.  Discontinued Medications   No medications on file    Physical Exam:  Vitals:   10/15/23 1403  BP: 132/80  Pulse: 65  Temp: (!) 95.1 F (35.1 C)  SpO2: 97%  Weight: 83.2 kg  Height: 5\' 2"  (1.575 m)   Body mass index is 33.54 kg/m. Wt Readings from Last 3 Encounters:  10/15/23 83.2 kg  07/16/23 82.1 kg  07/06/23 83.5 kg    Physical Exam Constitutional:      Appearance: Normal appearance.  Cardiovascular:     Rate and Rhythm: Normal rate and regular rhythm.     Pulses: Normal pulses.     Heart sounds: Normal heart sounds.  Pulmonary:     Effort: Pulmonary effort is normal.  Abdominal:     General: Bowel sounds are normal.     Palpations: Abdomen is soft.  Musculoskeletal:     Right lower leg: No edema.     Left lower leg: No edema.  Skin:    General: Skin is warm and dry.     Findings: Bruising (right lower leg) present.  Neurological:     General: No focal deficit present.     Mental Status: She is alert and oriented to person, place, and time. Mental status is at baseline.     Motor: Weakness present.  Psychiatric:        Mood and Affect: Mood normal.        Behavior: Behavior normal.         Thought Content: Thought content normal.     Labs reviewed: Basic Metabolic Panel: Recent Labs    11/27/22 1200 04/23/23 1340 07/06/23 1400  NA 141 140 139  K 4.1 3.8 3.8  CL 104 100 101  CO2 23 29 30   GLUCOSE 97 99 103*  BUN 19 17 15   CREATININE 0.80 0.80 0.76  CALCIUM 9.3 9.3 9.1   Liver Function Tests: No results for input(s): "AST", "ALT", "ALKPHOS", "BILITOT", "PROT", "ALBUMIN" in the last 8760 hours. No results for input(s): "LIPASE", "AMYLASE" in the last 8760 hours. No results for input(s): "AMMONIA" in the last 8760 hours. CBC: Recent Labs    11/27/22 1200 04/23/23 1340 07/06/23 1400  WBC 5.0 7.0 6.3  NEUTROABS 2.9  --   --   HGB 12.1 12.3 13.0  HCT 37.0 38.2 39.9  MCV 94 96.0 95.5  PLT 184 170 166   Lipid Panel: No results for input(s): "CHOL", "HDL", "LDLCALC", "TRIG", "CHOLHDL", "LDLDIRECT" in the last 8760 hours. TSH: No results for input(s): "TSH"  in the last 8760 hours. A1C: Lab Results  Component Value Date   HGBA1C 5.2 08/06/2017     Assessment/Plan 1. Essential hypertension -Controlled, at goal, <140/90 -Continue current medication regimen -Encouraged dietary modifications and physical activity as tolerated - TSH - Complete Metabolic Panel with eGFR - CBC with Differential/Platelet  2. Paroxysmal atrial fibrillation (HCC) -Continue lopressor, rate controlled and regular today.  -Follow-up with cardiology for episodes of palpitations/dizziness - Complete Metabolic Panel with eGFR - CBC with Differential/Platelet  3. Spinal stenosis of lumbar region with neurogenic claudication -s/p lumbar laminectomy  on 07/16/23 pain has slowly improved.  Continue with thearpy -PRN capsaicin, flexeril, Norco/Vicodin  4. Hyperlipidemia, unspecified hyperlipidemia type -Continue ezetimibe -Encouraged dietary modifications and physical activity as tolerated - Lipid Panel  5. Osteoporosis, unspecified osteoporosis type, unspecified  pathological fracture presence -Encouraged weight bearing activity -Continue Vit. D/Calcium supplementation  6. Gastroesophageal reflux disease without esophagitis -Encouraged dietary modifications and physical activity as tolerated - esomeprazole (NEXIUM) 20 MG capsule; Take 1 capsule (20 mg total) by mouth as needed.  Dispense: 90 capsule; Refill: 0  7. Primary insomnia -PRN trazodone  8. Arm and leg pain -tenderness after fall, no abnormal brusing or decrease in ROM Continue with PRN tylenol  Fall precautions   9. Need for influenza vaccination - Flu Vaccine Trivalent High Dose (Fluad)   Return in about 4 months (around 02/15/2024) for routine follow up .  Rollen Sox, FNP-MSN Student -I personally was present during the history, physical exam and medical decision-making activities of this service and have verified that the service and findings are accurately documented in the student's note Abbey Chatters, NP

## 2023-10-15 NOTE — Patient Instructions (Signed)
To take Loratadine or cetrizine (generic for Claritin or zyrtec) 10 mg by mouth daily for allergies.  Get plain

## 2023-10-17 ENCOUNTER — Other Ambulatory Visit: Payer: Self-pay | Admitting: Nurse Practitioner

## 2023-10-17 NOTE — Telephone Encounter (Signed)
High risk or very high risk warning populated when attempting to refill medication. RX request sent to PCP for review and approval if warranted.

## 2023-10-18 DIAGNOSIS — M5416 Radiculopathy, lumbar region: Secondary | ICD-10-CM | POA: Diagnosis not present

## 2023-10-18 DIAGNOSIS — M545 Low back pain, unspecified: Secondary | ICD-10-CM | POA: Diagnosis not present

## 2023-10-18 NOTE — Plan of Care (Signed)
[facility] Tonsillectomy/Adenoidectomy, Postoperative PEDS care plan entered in error.

## 2023-10-22 DIAGNOSIS — M5416 Radiculopathy, lumbar region: Secondary | ICD-10-CM | POA: Diagnosis not present

## 2023-10-22 DIAGNOSIS — Z1231 Encounter for screening mammogram for malignant neoplasm of breast: Secondary | ICD-10-CM | POA: Diagnosis not present

## 2023-10-22 DIAGNOSIS — M545 Low back pain, unspecified: Secondary | ICD-10-CM | POA: Diagnosis not present

## 2023-10-22 LAB — HM MAMMOGRAPHY

## 2023-10-25 DIAGNOSIS — M545 Low back pain, unspecified: Secondary | ICD-10-CM | POA: Diagnosis not present

## 2023-10-25 DIAGNOSIS — M5416 Radiculopathy, lumbar region: Secondary | ICD-10-CM | POA: Diagnosis not present

## 2023-10-29 DIAGNOSIS — M545 Low back pain, unspecified: Secondary | ICD-10-CM | POA: Diagnosis not present

## 2023-10-29 DIAGNOSIS — M5416 Radiculopathy, lumbar region: Secondary | ICD-10-CM | POA: Diagnosis not present

## 2023-11-12 DIAGNOSIS — H16223 Keratoconjunctivitis sicca, not specified as Sjogren's, bilateral: Secondary | ICD-10-CM | POA: Diagnosis not present

## 2023-11-12 DIAGNOSIS — H401131 Primary open-angle glaucoma, bilateral, mild stage: Secondary | ICD-10-CM | POA: Diagnosis not present

## 2023-11-13 DIAGNOSIS — M5416 Radiculopathy, lumbar region: Secondary | ICD-10-CM | POA: Diagnosis not present

## 2023-11-13 DIAGNOSIS — Z6831 Body mass index (BMI) 31.0-31.9, adult: Secondary | ICD-10-CM | POA: Diagnosis not present

## 2023-11-28 ENCOUNTER — Other Ambulatory Visit: Payer: Self-pay | Admitting: Cardiology

## 2023-11-30 ENCOUNTER — Telehealth: Payer: Self-pay

## 2023-11-30 NOTE — Telephone Encounter (Signed)
The pt need the nurse to call the pharmacy about a medication. [facility] on [address]. I told her I will send a phone note.

## 2023-12-03 NOTE — Telephone Encounter (Signed)
Left message with pharmacy to call back if they needed to speak with [place] about this pt.

## 2023-12-18 ENCOUNTER — Ambulatory Visit (INDEPENDENT_AMBULATORY_CARE_PROVIDER_SITE_OTHER): Payer: Medicare Other

## 2023-12-18 DIAGNOSIS — I495 Sick sinus syndrome: Secondary | ICD-10-CM

## 2023-12-18 LAB — CUP PACEART REMOTE DEVICE CHECK
Battery Remaining Longevity: 29 mo
Battery Remaining Percentage: 29 %
Battery Voltage: 2.92 V
Brady Statistic AP VP Percent: 5.5 %
Brady Statistic AP VS Percent: 93 %
Brady Statistic AS VP Percent: 1 %
Brady Statistic AS VS Percent: 1 %
Brady Statistic RA Percent Paced: 99 %
Brady Statistic RV Percent Paced: 6.3 %
Date Time Interrogation Session: 20241224020015
Implantable Lead Connection Status: 753985
Implantable Lead Connection Status: 753985
Implantable Lead Implant Date: 20170628
Implantable Lead Implant Date: 20170628
Implantable Lead Location: 753859
Implantable Lead Location: 753860
Implantable Pulse Generator Implant Date: 20170628
Lead Channel Impedance Value: 490 Ohm
Lead Channel Impedance Value: 580 Ohm
Lead Channel Pacing Threshold Amplitude: 0.75 V
Lead Channel Pacing Threshold Amplitude: 1.5 V
Lead Channel Pacing Threshold Pulse Width: 0.5 ms
Lead Channel Pacing Threshold Pulse Width: 0.5 ms
Lead Channel Sensing Intrinsic Amplitude: 11.7 mV
Lead Channel Sensing Intrinsic Amplitude: 2 mV
Lead Channel Setting Pacing Amplitude: 2 V
Lead Channel Setting Pacing Amplitude: 3 V
Lead Channel Setting Pacing Pulse Width: 0.5 ms
Lead Channel Setting Sensing Sensitivity: 2 mV
Pulse Gen Model: 2272
Pulse Gen Serial Number: 7910313

## 2023-12-26 NOTE — Progress Notes (Signed)
 Cardiology Office Note   Date:  01/03/2024   ID:  [name], DOB 1931/07/22, MRN [id number]  PCP:  [name], NP  Cardiologist:   Pharrell Ledford, MD   Chief Complaint  Patient presents with   Atrial Fibrillation     History of Present Illness: [name] is a 89 y.o. female is seen for follow up Afib and dyspnea. She also needs clearance for lumbar laminectomy with Dr Alm Molt. She has a long history of marked sinus bradycardia and atrial fibrillation/flutter. She was seen initially in 2014. Echo at that time showed mild LAE otherwise normal. Holter showed mean HR 59 with lowest HR 43 and peak HR 109.    On March 9,2017 she noted an increased HR by BP monitor up to 153. At this time she felt marked indigestion from her waist to her neck. She felt her heart fluttering.   She was found to be in Afib with RVR. She was started on Eliquis  and metoprolol . Myoview study and Echo were normal.   She later had attempt at DCCV. She was in an atypical atrial flutter at that time. DCCV resulted in very prolonged pauses > 6 seconds and return to flutter. She was seen by Dr. Inocencio and placed on flecainide . This did convert her to NSR but made her feel very sick with nausea, dizziness, and extreme fatigue. Flecainide  was discontinued.. She continued to have marked bradycardia and underwent PPM placement on 06/21/16. When seen in September 2018 by Dr. Waddell she had an Afib burden of 94%. She was started on propafenone  for her Afib. Initially this medication caused her to have more nausea but this has improved.  On her subsequent  checks her Afib burden was less than 1% since December 2019.   She was seen by Dr Waddell on 11/17. By pacer check she was in Afib continually. Rate controlled. On follow up she noted some increased SOB and fatigue over the past month or two. Really is not aware of palpitations. No edema. No chest pain. She did undergo DCCV on 12/04/22. On follow up pacer check on  Dec 28 she was maintaining NSR. Was seen in Feb by EP and still doing well.   She did undergo lumbar surgery in July. Noted significant improvement initially but now with recurrent problems in her right leg. Now doing PT, surgical options limited.  On last pacer check in Dec Afib burden was <1%.    Past Medical History:  Diagnosis Date   A-fib [facility])    Allergic rhinitis due to pollen    Anxiety state, unspecified    Sitautional- pain   Arthritis    right knee (06/21/2016)   Asymptomatic varicose veins    Carpal tunnel syndrome    Chest pain, unspecified    Chronic lower back pain    on the left side (06/21/2016)   Constipation    Cramp of limb    Depressive disorder, not elsewhere classified    pt denies   Diaphragmatic hernia without mention of obstruction or gangrene    Diaphragmatic hernia without mention of obstruction or gangrene    Disturbance of skin sensation    Diverticulosis of colon (without mention of hemorrhage)    Dysrhythmia    Atrial Flutter   Enthesopathy of hip region    Esophageal reflux    , just occasional   History of blood transfusion    w/both knee replacements   History of duodenal ulcer  History of kidney stones    passed   Insomnia, unspecified    Lumbago    Migraine    none since ~ 1990 (06/21/2016)   Myalgia and myositis, unspecified    Obesity, unspecified    Other abnormal blood chemistry    Other dysphagia    Other malaise and fatigue    Other nonspecific abnormal serum enzyme levels    Other specified cardiac dysrhythmias(427.89)    Pacemaker    Pain in joint, ankle and foot    Pain in joint, hand    Pain in joint, lower leg    Pain in joint, pelvic region and thigh    Pain in limb    Plantar fascial fibromatosis    Presence of permanent cardiac pacemaker    -St Jude   Reflux esophagitis    Sciatica    Spinal stenosis, unspecified region other than cervical    Stricture and stenosis of esophagus    Symptomatic  menopausal or female climacteric states    Unspecified essential hypertension    Unspecified essential hypertension    Unspecified vitamin D  deficiency     Past Surgical History:  Procedure Laterality Date   BREAST BIOPSY Left 1990s X 2   CARDIOVERSION N/A 04/26/2016   Procedure: CARDIOVERSION;  Surgeon: Vinie JAYSON Maxcy, MD;  Location: [facility] ENDOSCOPY;  Service: Cardiovascular;  Laterality: N/A;   CARDIOVERSION N/A 12/04/2022   Procedure: CARDIOVERSION;  Surgeon: Loni Soyla LABOR, MD;  Location: [facility] ENDOSCOPY;  Service: Cardiovascular;  Laterality: N/A;   CATARACT EXTRACTION W/ INTRAOCULAR LENS IMPLANT Left 08/03/1999   DR EPES    CATARACT EXTRACTION W/ INTRAOCULAR LENS IMPLANT Right 2000   DR EPES   CHOLECYSTECTOMY OPEN  1989   DR BOWMAN   COLONOSCOPY  1988   Normal    DENTAL SURGERY Left 08/2016   EP IMPLANTABLE DEVICE N/A 06/21/2016   Procedure: Pacemaker Implant;  Surgeon: Danelle LELON Birmingham, MD;  Location: [facility] INVASIVE CV LAB;  Service: Cardiovascular;  Laterality: N/A;   ESOPHAGOGASTRODUODENOSCOPY (EGD) WITH ESOPHAGEAL DILATION  ~ 1982   Dr. Jakie   EXCISION OF ACTINIC KERATOSIS     DR LUPTON    INSERT / REPLACE / REMOVE PACEMAKER  06/21/2016   [employer] Jude   KNEE ARTHROSCOPY Left 2003   KNEE ARTHROSCOPY Right 06/26/2013   KNEE CLOSED REDUCTION Right 10/23/2013   Procedure: CLOSED MANIPULATION RIGHT KNEE;  Surgeon: Lonni CINDERELLA Poli, MD;  Location: [facility] OR;  Service: Orthopedics;  Laterality: Right;   LASER FOR CLOUDY CAP LEFT EYE Left 03/2006   DR DIGBY   LUMBAR LAMINECTOMY/DECOMPRESSION MICRODISCECTOMY N/A 06/12/2019   Procedure: Laminectomy and Foraminotomy - Lumbar four-Lumbar five;  Surgeon: Joshua Alm RAMAN, MD;  Location: [facility] OR;  Service: Neurosurgery;  Laterality: N/A;   LUMBAR LAMINECTOMY/DECOMPRESSION MICRODISCECTOMY Bilateral 07/16/2023   Procedure: Right- - Lumbar three-Lumbar four - Lumbar four-Lumbar five Decompressive laminectomy;  Surgeon: Joshua Alm Hamilton, MD;   Location: [facility] OR;  Service: Neurosurgery;  Laterality: Bilateral;   SKIN SURGERY  04/2022   [facility], Dr.Gray. Removed area on upper chest   TOTAL KNEE ARTHROPLASTY Left 04/2004   DR RENDALL   TOTAL KNEE ARTHROPLASTY Right 07/04/2013   Procedure: RIGHT TOTAL KNEE ARTHROPLASTY;  Surgeon: Lonni CINDERELLA Poli, MD;  Location: [facility] ORS;  Service: Orthopedics;  Laterality: Right;   TRIGGER FINGER RELEASE Right 09/2018   VAGINAL HYSTERECTOMY  1979     Current Outpatient Medications  Medication Sig Dispense Refill   acetaminophen  (TYLENOL ) 500  MG tablet Take 500-1,000 mg by mouth every 6 (six) hours as needed for moderate pain.     antiseptic oral rinse (BIOTENE) LIQD 15 mLs by Mouth Rinse route 2 (two) times daily.     Artificial Saliva (ACT DRY MOUTH) LOZG Use as directed 1 drop in the mouth or throat as needed (dry mouth).     bimatoprost  (LUMIGAN ) 0.01 % SOLN Place 1 drop into both eyes at bedtime.      Calcium  Citrate-Vitamin D  (CALCIUM  + D PO) Take 1 tablet by mouth in the morning.     Cholecalciferol  (VITAMIN D3 SUPER STRENGTH) 50 MCG (2000 UT) TABS Take 2,000 Units by mouth daily.     cyanocobalamin (VITAMIN B12) 1000 MCG tablet Take 1,000 mcg by mouth in the morning.     Dentifrices (BIOTENE DRY MOUTH GENTLE) PSTE Place 1 Application onto teeth in the morning and at bedtime.     Docusate Calcium  (STOOL SOFTENER PO) Take 100 mg by mouth 3 (three) times a week. At night     dorzolamide  (TRUSOPT ) 2 % ophthalmic solution Place 1 drop into both eyes 3 (three) times daily.     esomeprazole  (NEXIUM ) 20 MG capsule Take 1 capsule (20 mg total) by mouth as needed. 90 capsule 0   ezetimibe  (ZETIA ) 10 MG tablet TAKE ONE TABLET BY MOUTH ONCE DAILY 90 tablet 3   Glycerin, Laxative, (ADULT SUPPOSITORY RE) Place 1 suppository rectally daily as needed (constipation.).     HYDROcodone -acetaminophen  (NORCO/VICODIN) 5-325 MG tablet Take 1 tablet by mouth every 4 (four) hours as needed for moderate  pain. 20 tablet 0   hydroquinone  4 % cream Apply topically daily. (Patient taking differently: Apply 1 Application topically every other day.) 28.35 g 0   Magnesium  Oxide (MAGNESIUM  EXTRA STRENGTH PO) Apply 1 Application topically as needed (knee pain.). MAGNESIUM  OIL     methylcellulose (ARTIFICIAL TEARS) 1 % ophthalmic solution Place 1 drop into both eyes 3 (three) times daily as needed (for dry eyes).     metoprolol  tartrate (LOPRESSOR ) 25 MG tablet TAKE 1 TABLET BY MOUTH TWICE A DAY 180 tablet 3   OVER THE COUNTER MEDICATION Take 1 capsule by mouth 2 (two) times daily. HydroEye Softgels Dry Eye Relief     propafenone  (RYTHMOL  SR) 225 MG 12 hr capsule TAKE 1 CAPSULE BY MOUTH TWICE A DAY 180 capsule 2   sodium chloride  (OCEAN) 0.65 % SOLN nasal spray Place 1 spray into both nostrils 2 (two) times daily as needed (dry nasal passages). [employer] Saline Nasal Moisturizing Spray     traMADol  (ULTRAM ) 50 MG tablet Take by mouth.     traZODone  (DESYREL ) 50 MG tablet TAKE 1 TABLET BY MOUTH EVERYDAY AT BEDTIME (Patient taking differently: Take 25-50 mg by mouth at bedtime as needed (pain.).) 90 tablet 1   valsartan -hydrochlorothiazide  (DIOVAN -HCT) 320-25 MG tablet TAKE ONE TABLET BY MOUTH DAILY 90 tablet 3   Capsaicin (ASPERCREME PAIN RELIEF PATCH EX) Place 1 patch onto the skin daily as needed (pain.). (Patient not taking: Reported on 01/03/2024)     cyclobenzaprine  (FLEXERIL ) 10 MG tablet Take 1 tablet (10 mg total) by mouth 3 (three) times daily as needed for muscle spasms. (Patient not taking: Reported on 01/03/2024) 36 tablet 0   No current facility-administered medications for this visit.    Allergies:   Advil [ibuprofen], Aleve [naproxen sodium], Aspirin , Atorvastatin , Celebrex [celecoxib], Diclofenac, Doxycycline , Nsaids, Other, Pravastatin , Timolol, and Metoclopramide  hcl    Social History:  The patient  reports  that she has never smoked. She has never used smokeless tobacco. She reports that she does  not drink alcohol  and does not use drugs.   Family History:  The patient's family history includes Cerebrovascular Accident in her mother; Heart disease in her father; Hypertension in her brother; Obesity in her daughter; Ovarian cancer in her mother; Uterine cancer in her mother.    ROS:  Please see the history of present illness.   Otherwise, review of systems are positive for none.   All other systems are reviewed and negative.    PHYSICAL EXAM: VS:  BP 128/74 (BP Location: Left Arm, Patient Position: Sitting, Cuff Size: Normal)   Pulse 68   Ht 5' 2 (1.575 m)   Wt 181 lb (82.1 kg)   SpO2 97%   BMI 33.11 kg/m  , BMI Body mass index is 33.11 kg/m. GENERAL:  Well appearing, obese WF in NAD HEENT:  PERRL, EOMI, sclera are clear. Oropharynx is clear. NECK:  No jugular venous distention, carotid upstroke brisk and symmetric, no bruits, no thyromegaly or adenopathy LUNGS:  clear  CHEST:  Unremarkable HEART:  IRRR,  PMI not displaced or sustained,S1 and S2 within normal limits, no S3, no S4: no clicks, no rubs, no murmurs ABD:  Soft, nontender. BS +, no masses or bruits. No hepatomegaly, no splenomegaly EXT:  2 + pulses throughout, no edema, no cyanosis no clubbing SKIN:  Warm and dry.  No rashes NEURO:  Alert and oriented x 3. Cranial nerves II through XII intact. PSYCH:  Cognitively intact        Recent Labs: 07/06/2023: BUN 15; Creatinine, Ser 0.76; Hemoglobin 13.0; Platelets 166; Potassium 3.8; Sodium 139    Lipid Panel    Component Value Date/Time   CHOL 188 07/17/2022 1436   CHOL 179 03/27/2016 0853   TRIG 347 (H) 07/17/2022 1436   HDL 36 (L) 07/17/2022 1436   HDL 42 03/27/2016 0853   CHOLHDL 5.2 (H) 07/17/2022 1436   VLDL 42 (H) 08/06/2017 0918   LDLCALC 106 (H) 07/17/2022 1436      Wt Readings from Last 3 Encounters:  01/03/24 181 lb (82.1 kg)  10/15/23 183 lb 6.4 oz (83.2 kg)  07/16/23 181 lb (82.1 kg)      Other studies Reviewed: Additional studies/  records that were reviewed today include:  Echo: 09/06/17: Study Conclusions   - Left ventricle: The cavity size was normal. Wall thickness was   increased in a pattern of mild LVH. Systolic function was normal.   The estimated ejection fraction was in the range of 55% to 60%.   Doppler parameters are consistent with both elevated ventricular   end-diastolic filling pressure and elevated left atrial filling   pressure. - Left atrium: The atrium was moderately dilated. - Atrial septum: There was increased thickness of the septum,   consistent with lipomatous hypertrophy. No defect or patent   foramen ovale was identified  Echo 10/7//21: IMPRESSIONS     1. Left ventricular ejection fraction, by estimation, is 55 to 60%. The  left ventricle has normal function. The left ventricle has no regional  wall motion abnormalities. There is mild left ventricular hypertrophy.  Left ventricular diastolic parameters  are consistent with Grade I diastolic dysfunction (impaired relaxation).  The E/e' is 11.   2. Right ventricular systolic function is normal. The right ventricular  size is normal.   3. Left atrial size was moderately dilated.   4. The mitral valve is grossly normal. No  evidence of mitral valve  regurgitation.   5. The aortic valve is tricuspid. There is mild calcification of the  aortic valve. Aortic valve regurgitation is not visualized.   6. The inferior vena cava is normal in size with greater than 50%  respiratory variability, suggesting right atrial pressure of 3 mmHg.   Comparison(s): No significant change from prior study.   ASSESSMENT AND PLAN:  1.   Atrial fibrillation/flutter with RVR.  Intolerant of flecainide .  s/p PPM placement for marked post conversion pauses and bradycardia.  Has been on  propafenone  with very low Afib  burden for 6 years until  November 2023 when she developed sustained AFib.  Followed by Dr Waddell. She has been compliant with her medication. She  was symptomatic. She is now s/p  DCCV on Dec 11. Continue propafenone  and anticoagulation. Afib  burden is still quite low. If Afib burden increases or is more sustained  may need to consider alternative AAD therapy.   2. HTN well controlled.  3. Mild hypercholesterolemia.     Signed, Robinson Brinkley, MD,FACC  01/03/2024 2:52 PM    [facility] [address] [place], [place], [address] Phone [phone], Fax [phone]

## 2024-01-02 DIAGNOSIS — H401131 Primary open-angle glaucoma, bilateral, mild stage: Secondary | ICD-10-CM | POA: Diagnosis not present

## 2024-01-03 ENCOUNTER — Ambulatory Visit: Payer: Medicare HMO | Attending: Cardiology | Admitting: Cardiology

## 2024-01-03 ENCOUNTER — Encounter: Payer: Self-pay | Admitting: Cardiology

## 2024-01-03 VITALS — BP 128/74 | HR 68 | Ht 62.0 in | Wt 181.0 lb

## 2024-01-03 DIAGNOSIS — Z950 Presence of cardiac pacemaker: Secondary | ICD-10-CM

## 2024-01-03 DIAGNOSIS — I480 Paroxysmal atrial fibrillation: Secondary | ICD-10-CM

## 2024-01-03 DIAGNOSIS — I10 Essential (primary) hypertension: Secondary | ICD-10-CM

## 2024-01-03 MED ORDER — APIXABAN 5 MG PO TABS: 5.0000 mg | ORAL_TABLET | Freq: Two times a day (BID) | ORAL | 11 refills | Status: DC

## 2024-01-03 NOTE — Patient Instructions (Signed)
 Medication Instructions:  Continue same medications *If you need a refill on your cardiac medications before your next appointment, please call your pharmacy*   Lab Work: None ordered   Testing/Procedures: None ordered   Follow-Up: At [facility], you and your health needs are our priority.  As part of our continuing mission to provide you with exceptional heart care, we have created designated Provider Care Teams.  These Care Teams include your primary Cardiologist (physician) and Advanced Practice Providers (APPs -  Physician Assistants and Nurse Practitioners) who all work together to provide you with the care you need, when you need it.  We recommend signing up for the patient portal called MyChart.  Sign up information is provided on this After Visit Summary.  MyChart is used to connect with patients for Virtual Visits (Telemedicine).  Patients are able to view lab/test results, encounter notes, upcoming appointments, etc.  Non-urgent messages can be sent to your provider as well.   To learn more about what you can do with MyChart, go to [email or web address].    Your next appointment:  6 months   Call in March to schedule July appointment     Provider:  Dr.Jordan

## 2024-01-13 ENCOUNTER — Other Ambulatory Visit: Payer: Self-pay | Admitting: Nurse Practitioner

## 2024-01-13 DIAGNOSIS — K219 Gastro-esophageal reflux disease without esophagitis: Secondary | ICD-10-CM

## 2024-01-28 NOTE — Progress Notes (Signed)
 Remote pacemaker transmission.

## 2024-01-29 DIAGNOSIS — H401131 Primary open-angle glaucoma, bilateral, mild stage: Secondary | ICD-10-CM | POA: Diagnosis not present

## 2024-02-07 ENCOUNTER — Other Ambulatory Visit: Payer: Self-pay | Admitting: Internal Medicine

## 2024-02-12 ENCOUNTER — Encounter: Payer: Self-pay | Admitting: Nurse Practitioner

## 2024-02-12 ENCOUNTER — Ambulatory Visit (INDEPENDENT_AMBULATORY_CARE_PROVIDER_SITE_OTHER): Payer: Medicare HMO | Admitting: Nurse Practitioner

## 2024-02-12 DIAGNOSIS — Z0000 Encounter for general adult medical examination without abnormal findings: Secondary | ICD-10-CM

## 2024-02-12 NOTE — Progress Notes (Signed)
 This service is provided via telemedicine  No vital signs collected/recorded due to the encounter was a telemedicine visit.   Location of patient (ex: home, work):  Home  Patient consents to a telephone visit:  Yes  Location of the provider (ex: office, home):  Office [place].   Name of any referring provider:  na  Names of all persons participating in the telemedicine service and their role in the encounter:  Gae Gallop, Patient, Nelda Severe, CMA, Abbey Chatters, NP  Time spent on call:  7:10

## 2024-02-12 NOTE — Patient Instructions (Signed)
  Ms. Tonkinson , Thank you for taking time to come for your Medicare Wellness Visit. I appreciate your ongoing commitment to your health goals. Please review the following plan we discussed and let me know if I can assist you in the future.     This is a list of the screening recommended for you and due dates:  Health Maintenance  Topic Date Due   COVID-19 Vaccine (4 - 2024-25 season) 02/28/2024*   Mammogram  10/21/2024   Medicare Annual Wellness Visit  02/11/2025   DTaP/Tdap/Td vaccine (2 - Td or Tdap) 06/25/2027   Pneumonia Vaccine  Completed   Flu Shot  Completed   DEXA scan (bone density measurement)  Completed   Zoster (Shingles) Vaccine  Completed   HPV Vaccine  Aged Out  *Topic was postponed. The date shown is not the original due date.

## 2024-02-12 NOTE — Progress Notes (Signed)
 Subjective:   [name] is a 89 y.o. female who presents for Medicare Annual (Subsequent) preventive examination.  Visit Complete: Virtual I connected with  [name] on 02/12/24 by a video and audio enabled telemedicine application and verified that I am speaking with the correct person using two identifiers.  Patient Location: Home  Provider Location: Office/Clinic  I discussed the limitations of evaluation and management by telemedicine. The patient expressed understanding and agreed to proceed.  Vital Signs: Because this visit was a virtual/telehealth visit, some criteria may be missing or patient reported. Any vitals not documented were not able to be obtained and vitals that have been documented are patient reported.    Cardiac Risk Factors include: advanced age (>37men, >13 women);family history of premature cardiovascular disease;hypertension;obesity (BMI >30kg/m2)     Objective:    There were no vitals filed for this visit. There is no height or weight on file to calculate BMI.     02/12/2024   10:31 AM 07/06/2023    1:23 PM 06/15/2023   11:42 AM 04/23/2023    1:20 PM 02/06/2023    9:28 AM 12/11/2022   11:25 AM 12/04/2022    8:39 AM  Advanced Directives  Does Patient Have a Medical Advance Directive? Yes Yes Yes Yes Yes Yes Yes  Type of Estate agent of [place];Out of facility DNR (pink MOST or yellow form);Living will Healthcare Power of [place];Living will Healthcare Power of [place];Out of facility DNR (pink MOST or yellow form) Healthcare Power of [place];Living will Out of facility DNR (pink MOST or yellow form);Healthcare Power of [employer] of [place];Out of facility DNR (pink MOST or yellow form) Living will  Does patient want to make changes to medical advance directive? No - Patient declined No - Patient declined No - Patient declined No - Patient declined No - Patient declined    Copy of Healthcare Power of  Attorney in Chart? Yes - validated most recent copy scanned in chart (See row information) No - copy requested Yes - validated most recent copy scanned in chart (See row information) Yes - validated most recent copy scanned in chart (See row information) Yes - validated most recent copy scanned in chart (See row information) Yes - validated most recent copy scanned in chart (See row information)   Pre-existing out of facility DNR order (yellow form or pink MOST form)   Yellow form placed in chart (order not valid for inpatient use);Pink MOST form placed in chart (order not valid for inpatient use)  Yellow form placed in chart (order not valid for inpatient use);Pink MOST form placed in chart (order not valid for inpatient use) Yellow form placed in chart (order not valid for inpatient use);Pink MOST form placed in chart (order not valid for inpatient use)     Current Medications (verified) Outpatient Encounter Medications as of 02/12/2024  Medication Sig   acetaminophen (TYLENOL) 500 MG tablet Take 500-1,000 mg by mouth every 6 (six) hours as needed for moderate pain.   antiseptic oral rinse (BIOTENE) LIQD 15 mLs by Mouth Rinse route 2 (two) times daily.   apixaban (ELIQUIS) 5 MG TABS tablet Take 1 tablet (5 mg total) by mouth 2 (two) times daily.   Artificial Saliva (ACT DRY MOUTH) LOZG Use as directed 1 drop in the mouth or throat as needed (dry mouth).   bimatoprost (LUMIGAN) 0.01 % SOLN Place 1 drop into both eyes at bedtime.    Calcium Citrate-Vitamin D (CALCIUM +  D PO) Take 1 tablet by mouth in the morning.   Cholecalciferol (VITAMIN D3 SUPER STRENGTH) 50 MCG (2000 UT) TABS Take 2,000 Units by mouth daily.   cyanocobalamin (VITAMIN B12) 1000 MCG tablet Take 1,000 mcg by mouth in the morning.   Dentifrices (BIOTENE DRY MOUTH GENTLE) PSTE Place 1 Application onto teeth in the morning and at bedtime.   Docusate Calcium (STOOL SOFTENER PO) Take 100 mg by mouth 3 (three) times a week. At night    dorzolamide (TRUSOPT) 2 % ophthalmic solution Place 1 drop into both eyes 3 (three) times daily.   esomeprazole (NEXIUM) 20 MG capsule TAKE 1 CAPSULE (20 MG TOTAL) BY MOUTH AS NEEDED   ezetimibe (ZETIA) 10 MG tablet TAKE ONE TABLET BY MOUTH ONCE DAILY   Glycerin, Laxative, (ADULT SUPPOSITORY RE) Place 1 suppository rectally daily as needed (constipation.).   HYDROcodone-acetaminophen (NORCO/VICODIN) 5-325 MG tablet Take 1 tablet by mouth every 4 (four) hours as needed for moderate pain.   hydroquinone 4 % cream Apply topically daily. (Patient taking differently: Apply 1 Application topically every other day.)   Magnesium Oxide (MAGNESIUM EXTRA STRENGTH PO) Apply 1 Application topically as needed (knee pain.). MAGNESIUM OIL   methylcellulose (ARTIFICIAL TEARS) 1 % ophthalmic solution Place 1 drop into both eyes 3 (three) times daily as needed (for dry eyes).   metoprolol tartrate (LOPRESSOR) 25 MG tablet Take 1 tablet (25 mg total) by mouth 2 (two) times daily. Please call [phone] to schedule a May appointment with Dr. Lewayne Bunting for future refills. Thank you.   OVER THE COUNTER MEDICATION Take 1 capsule by mouth 2 (two) times daily. HydroEye Softgels Dry Eye Relief   propafenone (RYTHMOL SR) 225 MG 12 hr capsule TAKE 1 CAPSULE BY MOUTH TWICE A DAY   sodium chloride (OCEAN) 0.65 % SOLN nasal spray Place 1 spray into both nostrils 2 (two) times daily as needed (dry nasal passages). [employer] Saline Nasal Moisturizing Spray   traMADol (ULTRAM) 50 MG tablet Take by mouth.   traZODone (DESYREL) 50 MG tablet TAKE 1 TABLET BY MOUTH EVERYDAY AT BEDTIME (Patient taking differently: Take 25-50 mg by mouth at bedtime as needed (pain.).)   valsartan-hydrochlorothiazide (DIOVAN-HCT) 320-25 MG tablet TAKE ONE TABLET BY MOUTH DAILY   [DISCONTINUED] Capsaicin (ASPERCREME PAIN RELIEF PATCH EX) Place 1 patch onto the skin daily as needed (pain.). (Patient not taking: Reported on 01/03/2024)   [DISCONTINUED]  cyclobenzaprine (FLEXERIL) 10 MG tablet Take 1 tablet (10 mg total) by mouth 3 (three) times daily as needed for muscle spasms. (Patient not taking: Reported on 01/03/2024)   No facility-administered encounter medications on file as of 02/12/2024.    Allergies (verified) Advil [ibuprofen], Aleve [naproxen sodium], Aspirin, Atorvastatin, Celebrex [celecoxib], Diclofenac, Doxycycline, Nsaids, Other, Pravastatin, Timolol, and Metoclopramide hcl   History: Past Medical History:  Diagnosis Date   A-fib (HCC)    Allergic rhinitis due to pollen    Anxiety state, unspecified    Sitautional- pain   Arthritis    "right knee" (06/21/2016)   Asymptomatic varicose veins    Carpal tunnel syndrome    Chest pain, unspecified    Chronic lower back pain    "on the left side" (06/21/2016)   Constipation    Cramp of limb    Depressive disorder, not elsewhere classified    pt denies   Diaphragmatic hernia without mention of obstruction or gangrene    Diaphragmatic hernia without mention of obstruction or gangrene    Disturbance of skin sensation  Diverticulosis of colon (without mention of hemorrhage)    Dysrhythmia    Atrial Flutter   Enthesopathy of hip region    Esophageal reflux    , just occasional   History of blood transfusion    "w/both knee replacements"   History of duodenal ulcer    History of kidney stones    passed   Insomnia, unspecified    Lumbago    Migraine    "none since ~ 1990" (06/21/2016)   Myalgia and myositis, unspecified    Obesity, unspecified    Other abnormal blood chemistry    Other dysphagia    Other malaise and fatigue    Other nonspecific abnormal serum enzyme levels    Other specified cardiac dysrhythmias(427.89)    Pacemaker    Pain in joint, ankle and foot    Pain in joint, hand    Pain in joint, lower leg    Pain in joint, pelvic region and thigh    Pain in limb    Plantar fascial fibromatosis    Presence of permanent cardiac pacemaker    -St Jude    Reflux esophagitis    Sciatica    Spinal stenosis, unspecified region other than cervical    Stricture and stenosis of esophagus    Symptomatic menopausal or female climacteric states    Unspecified essential hypertension    Unspecified essential hypertension    Unspecified vitamin D deficiency    Past Surgical History:  Procedure Laterality Date   BREAST BIOPSY Left 1990s X 2   CARDIOVERSION N/A 04/26/2016   Procedure: CARDIOVERSION;  Surgeon: Chrystie Nose, MD;  Location: [facility] ENDOSCOPY;  Service: Cardiovascular;  Laterality: N/A;   CARDIOVERSION N/A 12/04/2022   Procedure: CARDIOVERSION;  Surgeon: Parke Poisson, MD;  Location[facility] ENDOSCOPY;  Service: Cardiovascular;  Laterality: N/A;   CATARACT EXTRACTION W/ INTRAOCULAR LENS IMPLANT Left 08/03/1999   DR EPES    CATARACT EXTRACTION W/ INTRAOCULAR LENS IMPLANT Right 2000   DR EPES   CHOLECYSTECTOMY OPEN  1989   DR BOWMAN   COLONOSCOPY  1988   Normal    DENTAL SURGERY Left 08/2016   EP IMPLANTABLE DEVICE N/A 06/21/2016   Procedure: Pacemaker Implant;  Surgeon: Marinus Maw, MD;  Location: [facility] INVASIVE CV LAB;  Service: Cardiovascular;  Laterality: N/A;   ESOPHAGOGASTRODUODENOSCOPY (EGD) WITH ESOPHAGEAL DILATION  ~ 1982   Dr. Jarold Motto   EXCISION OF ACTINIC KERATOSIS     DR LUPTON    INSERT / REPLACE / REMOVE PACEMAKER  06/21/2016   St Jude   KNEE ARTHROSCOPY Left 2003   KNEE ARTHROSCOPY Right 06/26/2013   KNEE CLOSED REDUCTION Right 10/23/2013   Procedure: CLOSED MANIPULATION RIGHT KNEE;  Surgeon: Kathryne Hitch, MD;  Location[facility] OR;  Service: Orthopedics;  Laterality: Right;   LASER FOR CLOUDY CAP LEFT EYE Left 03/2006   DR DIGBY   LUMBAR LAMINECTOMY/DECOMPRESSION MICRODISCECTOMY N/A 06/12/2019   Procedure: Laminectomy and Foraminotomy - Lumbar four-Lumbar five;  Surgeon: Tia Alert, MD;  Location: [facility] OR;  Service: Neurosurgery;  Laterality: N/A;   LUMBAR LAMINECTOMY/DECOMPRESSION MICRODISCECTOMY  Bilateral 07/16/2023   Procedure: Right- - Lumbar three-Lumbar four - Lumbar four-Lumbar five Decompressive laminectomy;  Surgeon: Arman Bogus, MD;  Location: [facility] OR;  Service: Neurosurgery;  Laterality: Bilateral;   SKIN SURGERY  04/2022   [facility], Dr.Gray. Removed area on upper chest   TOTAL KNEE ARTHROPLASTY Left 04/2004   DR RENDALL   TOTAL KNEE ARTHROPLASTY Right 07/04/2013  Procedure: RIGHT TOTAL KNEE ARTHROPLASTY;  Surgeon: Kathryne Hitch, MD;  Location: [facility] ORS;  Service: Orthopedics;  Laterality: Right;   TRIGGER FINGER RELEASE Right 09/2018   VAGINAL HYSTERECTOMY  1979   Family History  Problem Relation Age of Onset   Ovarian cancer Mother    Uterine cancer Mother    Cerebrovascular Accident Mother    Heart disease Father    Hypertension Brother    Obesity Daughter    Social History   Socioeconomic History   Marital status: Married    Spouse name: Not on file   Number of children: 1   Years of education: Not on file   Highest education level: Associate degree: occupational, Scientist, product/process development, or vocational program  Occupational History   Not on file  Tobacco Use   Smoking status: Never   Smokeless tobacco: Never  Vaping Use   Vaping status: Never Used  Substance and Sexual Activity   Alcohol use: No   Drug use: No   Sexual activity: Yes  Other Topics Concern   Not on file  Social History Narrative   Not on file   Social Drivers of Health   Financial Resource Strain: Low Risk  (02/11/2024)   Overall Financial Resource Strain (CARDIA)    Difficulty of Paying Living Expenses: Not hard at all  Food Insecurity: No Food Insecurity (02/12/2024)   Hunger Vital Sign    Worried About Running Out of Food in the Last Year: Never true    Ran Out of Food in the Last Year: Never true  Transportation Needs: No Transportation Needs (02/12/2024)   PRAPARE - Administrator, Civil Service (Medical): No    Lack of Transportation (Non-Medical): No   Physical Activity: Insufficiently Active (02/11/2024)   Exercise Vital Sign    Days of Exercise per Week: 1 day    Minutes of Exercise per Session: 10 min  Stress: No Stress Concern Present (02/11/2024)   [employer] of Occupational Health - Occupational Stress Questionnaire    Feeling of Stress : Only a little  Social Connections: Socially Integrated (02/12/2024)   Social Connection and Isolation Panel [NHANES]    Frequency of Communication with Friends and Family: More than three times a week    Frequency of Social Gatherings with Friends and Family: Twice a week    Attends Religious Services: More than 4 times per year    Active Member of [employer] or Organizations: Yes    Attends Banker Meetings: 1 to 4 times per year    Marital Status: Married    Tobacco Counseling Counseling given: Not Answered   Clinical Intake:  Pre-visit preparation completed: Yes        BMI - recorded: 33 Nutritional Status: BMI > 30  Obese  How often do you need to have someone help you when you read instructions, pamphlets, or other written materials from your doctor or pharmacy?: 1 - Never         Activities of Daily Living    02/12/2024   10:22 AM 02/11/2024    9:14 AM  In your present state of health, do you have any difficulty performing the following activities:  Hearing? 0 0  Vision? 0 0  Difficulty concentrating or making decisions? 0 0  Walking or climbing stairs? 0 1  Dressing or bathing? 0 0  Doing errands, shopping? 0 0  Preparing Food and eating ? N N  Using the Toilet? N N  In the past  six months, have you accidently leaked urine? Y Y  Do you have problems with loss of bowel control? N N  Managing your Medications? N N  Managing your Finances? N N  Housekeeping or managing your Housekeeping? N N    Patient Care Team: Sharon Seller, NP as PCP - General (Geriatric Medicine) [place], Peter M, MD as PCP - Cardiology (Cardiology) Kathryne Hitch, MD as Attending Physician (Orthopedic Surgery) Laurey Morale, MD as Attending Physician (Cardiology) Keturah Barre, MD as Consulting Physician (Otolaryngology) Cherlyn Roberts, MD as Consulting Physician (Dermatology) Rennis Golden Lisette Abu, MD as Consulting Physician (Cardiology) Arman Bogus, MD as Consulting Physician (Neurosurgery) Shon Millet, MD as Consulting Physician (Ophthalmology) Yetta Barre Thomes Dinning, MD as Consulting Physician (Neurosurgery)  Indicate any recent Medical Services you may have received from other than [facility] providers in the past year (date may be approximate).     Assessment:   This is a routine wellness examination for [place].  Hearing/Vision screen Vision Screening - Comments:: Dr. Sherrine Maples Next eye Exam 02/13/2024   Goals Addressed             This Visit's Progress    DIET - INCREASE WATER INTAKE   On track    Patient will increase water by 1-2 cups     Increase water intake   On track    Starting 12/12/16, I will attempt to increase my water intake to 5 glasses per day.        Depression Screen    02/12/2024   10:32 AM 06/15/2023    2:05 PM 02/06/2023   10:32 AM 12/11/2022    1:48 PM 01/31/2022   10:35 AM 01/24/2021   10:01 AM 10/01/2020    2:20 PM  PHQ 2/9 Scores  PHQ - 2 Score 0 0 0 0 0 0 0    Fall Risk    02/12/2024   10:32 AM 02/11/2024    9:14 AM 10/15/2023    2:04 PM 06/15/2023    2:05 PM 02/06/2023   10:32 AM  Fall Risk   Falls in the past year? 0 1 1 0 0  Number falls in past yr: 0 0 0 0 0  Injury with Fall? 0 0 0 0 0  Risk for fall due to : No Fall Risks  History of fall(s) No Fall Risks No Fall Risks  Follow up Falls evaluation completed  Falls evaluation completed Falls evaluation completed Falls evaluation completed    MEDICARE RISK AT HOME: Medicare Risk at Home Any stairs in or around the home?: Yes If so, are there any without handrails?: Yes Home free of loose throw rugs in walkways, pet beds, electrical  cords, etc?: Yes Adequate lighting in your home to reduce risk of falls?: Yes Life alert?: No Use of a cane, walker or w/c?: No Grab bars in the bathroom?: Yes Shower chair or bench in shower?: No Elevated toilet seat or a handicapped toilet?: Yes  TIMED UP AND GO:  Was the test performed?  No    Cognitive Function:    01/06/2019    2:23 PM 11/21/2017   10:13 AM 12/12/2016    2:24 PM 11/30/2015    3:06 PM  MMSE - Mini Mental State Exam  Orientation to time 5 5 5 5   Orientation to Place 5 5 5 5   Registration 3 3 3 3   Attention/ Calculation 5 5 5 5   Recall 3 3 3 3   Language- name 2 objects  2 2 2 2   Language- repeat 1 1 1 1   Language- follow 3 step command 3 3 3 3   Language- read & follow direction 1 1 1 1   Write a sentence 1 1 1 1   Copy design 1 1 1 1   Total score 30 30 30 30         02/12/2024   10:32 AM 02/06/2023   10:35 AM 01/31/2022   10:39 AM 01/24/2021   10:03 AM 01/12/2020    9:18 AM  6CIT Screen  What Year? 0 points 0 points 0 points 0 points 0 points  What month? 0 points 0 points 0 points 0 points 0 points  What time? 0 points 0 points 0 points 0 points 0 points  Count back from 20 0 points 0 points 0 points 0 points 0 points  Months in reverse 0 points 0 points 0 points 0 points 0 points  Repeat phrase 0 points 0 points 0 points 0 points 0 points  Total Score 0 points 0 points 0 points 0 points 0 points    Immunizations Immunization History  Administered Date(s) Administered   Fluad Quad(high Dose 65+) 09/29/2019, 10/01/2020, 10/07/2021, 11/08/2022   Fluad Trivalent(High Dose 65+) 10/15/2023   Influenza, High Dose Seasonal PF 09/14/2017, 10/02/2018   Influenza,inj,Quad PF,6+ Mos 09/23/2013, 09/25/2014, 10/11/2015, 08/30/2016   PFIZER(Purple Top)SARS-COV-2 Vaccination 01/16/2020, 02/06/2020, 09/29/2020   Pneumococcal Conjugate-13 02/17/2015   Pneumococcal Polysaccharide-23 12/12/2016   Tdap 06/24/2017   Zoster Recombinant(Shingrix) 09/04/2017, 02/21/2018    Zoster, Live 01/29/2008    TDAP status: Up to date  Flu Vaccine status: Up to date  Pneumococcal vaccine status: Up to date  Covid-19 vaccine status: Information provided on how to obtain vaccines.   Qualifies for Shingles Vaccine? Yes   Zostavax completed No   Shingrix Completed?: Yes  Screening Tests Health Maintenance  Topic Date Due   COVID-19 Vaccine (4 - 2024-25 season) 02/28/2024 (Originally 08/26/2023)   MAMMOGRAM  10/21/2024   Medicare Annual Wellness (AWV)  02/11/2025   DTaP/Tdap/Td (2 - Td or Tdap) 06/25/2027   Pneumonia Vaccine 79+ Years old  Completed   INFLUENZA VACCINE  Completed   DEXA SCAN  Completed   Zoster Vaccines- Shingrix  Completed   HPV VACCINES  Aged Out    Health Maintenance  There are no preventive care reminders to display for this patient.   Colorectal cancer screening: No longer required.   Mammogram status: Completed 10/21/2024. Repeat every year  Bone Density status: Completed 08/06/2023. Results reflect: Bone density results: OSTEOPOROSIS. Repeat every 2 years.  Lung Cancer Screening: (Low Dose CT Chest recommended if Age 74-80 years, 20 pack-year currently smoking OR have quit w/in 15years.) does not qualify.   Lung Cancer Screening Referral: na  Additional Screening:  Hepatitis C Screening: does not qualify; Completed   Vision Screening: Recommended annual ophthalmology exams for early detection of glaucoma and other disorders of the eye. Is the patient up to date with their annual eye exam?  Yes  Who is the provider or what is the name of the office in which the patient attends annual eye examDr Sherrine Maples If pt is not established with a provider, would they like to be referred to a provider to establish care? No .   Dental Screening: Recommended annual dental exams for proper oral hygiene  Community Resource Referral / Chronic Care Management: CRR required this visit?  No   CCM required this visit?  No     Plan:  I  have personally reviewed and noted the following in the patient's chart:   Medical and social history Use of alcohol, tobacco or illicit drugs  Current medications and supplements including opioid prescriptions. Patient is not currently taking opioid prescriptions. Functional ability and status Nutritional status Physical activity Advanced directives List of other physicians Hospitalizations, surgeries, and ER visits in previous 12 months Vitals Screenings to include cognitive, depression, and falls Referrals and appointments  In addition, I have reviewed and discussed with patient certain preventive protocols, quality metrics, and best practice recommendations. A written personalized care plan for preventive services as well as general preventive health recommendations were provided to patient.     Sharon Seller, NP   02/12/2024

## 2024-02-13 DIAGNOSIS — H401131 Primary open-angle glaucoma, bilateral, mild stage: Secondary | ICD-10-CM | POA: Diagnosis not present

## 2024-02-18 ENCOUNTER — Ambulatory Visit (INDEPENDENT_AMBULATORY_CARE_PROVIDER_SITE_OTHER): Payer: Medicare HMO | Admitting: Nurse Practitioner

## 2024-02-18 ENCOUNTER — Encounter: Payer: Self-pay | Admitting: Nurse Practitioner

## 2024-02-18 VITALS — BP 130/78 | HR 76 | Temp 97.3°F | Ht 62.0 in | Wt 182.0 lb

## 2024-02-18 DIAGNOSIS — K219 Gastro-esophageal reflux disease without esophagitis: Secondary | ICD-10-CM

## 2024-02-18 DIAGNOSIS — F5101 Primary insomnia: Secondary | ICD-10-CM

## 2024-02-18 DIAGNOSIS — I480 Paroxysmal atrial fibrillation: Secondary | ICD-10-CM | POA: Diagnosis not present

## 2024-02-18 DIAGNOSIS — K5900 Constipation, unspecified: Secondary | ICD-10-CM | POA: Diagnosis not present

## 2024-02-18 DIAGNOSIS — I10 Essential (primary) hypertension: Secondary | ICD-10-CM

## 2024-02-18 DIAGNOSIS — E785 Hyperlipidemia, unspecified: Secondary | ICD-10-CM | POA: Diagnosis not present

## 2024-02-18 DIAGNOSIS — G4700 Insomnia, unspecified: Secondary | ICD-10-CM | POA: Diagnosis not present

## 2024-02-18 DIAGNOSIS — M48062 Spinal stenosis, lumbar region with neurogenic claudication: Secondary | ICD-10-CM

## 2024-02-18 DIAGNOSIS — M810 Age-related osteoporosis without current pathological fracture: Secondary | ICD-10-CM | POA: Diagnosis not present

## 2024-02-18 NOTE — Progress Notes (Unsigned)
 Careteam: Patient Care Team: Sharon Seller, NP as PCP - General (Geriatric Medicine) [place], Peter M, MD as PCP - Cardiology (Cardiology) Kathryne Hitch, MD as Attending Physician (Orthopedic Surgery) Laurey Morale, MD as Attending Physician (Cardiology) Keturah Barre, MD as Consulting Physician (Otolaryngology) Cherlyn Roberts, MD as Consulting Physician (Dermatology) Rennis Golden Lisette Abu, MD as Consulting Physician (Cardiology) Arman Bogus, MD as Consulting Physician (Neurosurgery) Shon Millet, MD as Consulting Physician (Ophthalmology) Yetta Barre Thomes Dinning, MD as Consulting Physician (Neurosurgery)   PLACE OF SERVICE: [facility] CLINIC  Advanced Directive information     Allergies  Allergen Reactions   Advil [Ibuprofen] Other (See Comments)    GI upset   Aleve [Naproxen Sodium] Other (See Comments)    GI upset   Aspirin Other (See Comments)    Heartburn/indigestion.   Atorvastatin Other (See Comments)    Leg cramps   Brimonidine     Irritated left eye, headaches, burning, itching and foreign object sensation    Celebrex [Celecoxib] Other (See Comments)    GI upset   Diclofenac     GI upset   Doxycycline Other (See Comments)    Headache   Nsaids Other (See Comments)    Tears my stomach up    Other     Antihistamine - increase BP   Pravastatin Other (See Comments)    Leg cramps   Timolol Other (See Comments)    Dropped HR   Metoclopramide Hcl Palpitations     Chief Complaint  Patient presents with   Medical Management of Chronic Issues    4 month follow up      HPI: Patient is a 89 y.o. female presents for 4 month follow-up.   Saw cardiologist in January, states she has not been consistently in A.Fib. Denies chest pain, dizziness, palpitations.   Had back and spine surgery about 7 months ago, doing good overall. Right leg pain that radiates down to ankle. Takes tylenol as needed. Plans to go back to PT.  Needs refill on trazodone. Takes  1/2 tablet 2-3 times per week. Reports sleeping better when taking trazodone but still struggles with insomnia.  GERD- takes Nexium 1-2 times per months.  Constipation- stool softeners 2-3 times per week.  Started taking Vitamin B12 due to feeling tired often, thinks it has helped. Has been taking for about 2 weeks.  No concerns at this time.  Review of Systems:  Review of Systems  Constitutional:  Negative for chills, fever and weight loss.  Respiratory:  Negative for cough and shortness of breath.   Cardiovascular:  Negative for chest pain and palpitations.  Gastrointestinal:  Positive for constipation. Negative for abdominal pain, blood in stool, diarrhea, nausea and vomiting.  Genitourinary:  Positive for frequency and urgency. Negative for dysuria and hematuria.  Musculoskeletal:  Positive for back pain. Negative for falls.  Neurological:  Positive for weakness (right leg). Negative for dizziness and headaches.  Psychiatric/Behavioral:  Negative for depression and memory loss. The patient has insomnia. The patient is not nervous/anxious.     Past Medical History:  Diagnosis Date   A-fib [facility])    Allergic rhinitis due to pollen    Anxiety state, unspecified    Sitautional- pain   Arthritis    "right knee" (06/21/2016)   Asymptomatic varicose veins    Carpal tunnel syndrome    Chest pain, unspecified    Chronic lower back pain    "on the left side" (06/21/2016)   Constipation  Cramp of limb    Depressive disorder, not elsewhere classified    pt denies   Diaphragmatic hernia without mention of obstruction or gangrene    Diaphragmatic hernia without mention of obstruction or gangrene    Disturbance of skin sensation    Diverticulosis of colon (without mention of hemorrhage)    Dysrhythmia    Atrial Flutter   Enthesopathy of hip region    Esophageal reflux    , just occasional   History of blood transfusion    "w/both knee replacements"   History of duodenal ulcer     History of kidney stones    passed   Insomnia, unspecified    Lumbago    Migraine    "none since ~ 1990" (06/21/2016)   Myalgia and myositis, unspecified    Obesity, unspecified    Other abnormal blood chemistry    Other dysphagia    Other malaise and fatigue    Other nonspecific abnormal serum enzyme levels    Other specified cardiac dysrhythmias(427.89)    Pacemaker    Pain in joint, ankle and foot    Pain in joint, hand    Pain in joint, lower leg    Pain in joint, pelvic region and thigh    Pain in limb    Plantar fascial fibromatosis    Presence of permanent cardiac pacemaker    -St Jude   Reflux esophagitis    Sciatica    Spinal stenosis, unspecified region other than cervical    Stricture and stenosis of esophagus    Symptomatic menopausal or female climacteric states    Unspecified essential hypertension    Unspecified essential hypertension    Unspecified vitamin D deficiency     Past Surgical History:  Procedure Laterality Date   BREAST BIOPSY Left 1990s X 2   CARDIOVERSION N/A 04/26/2016   Procedure: CARDIOVERSION;  Surgeon: Chrystie Nose, MD;  Location: [facility] ENDOSCOPY;  Service: Cardiovascular;  Laterality: N/A;   CARDIOVERSION N/A 12/04/2022   Procedure: CARDIOVERSION;  Surgeon: Parke Poisson, MD;  Location: [facility] ENDOSCOPY;  Service: Cardiovascular;  Laterality: N/A;   CATARACT EXTRACTION W/ INTRAOCULAR LENS IMPLANT Left 08/03/1999   DR EPES    CATARACT EXTRACTION W/ INTRAOCULAR LENS IMPLANT Right 2000   DR EPES   CHOLECYSTECTOMY OPEN  1989   DR BOWMAN   COLONOSCOPY  1988   Normal    DENTAL SURGERY Left 08/2016   EP IMPLANTABLE DEVICE N/A 06/21/2016   Procedure: Pacemaker Implant;  Surgeon: Marinus Maw, MD;  Location: [facility] INVASIVE CV LAB;  Service: Cardiovascular;  Laterality: N/A;   ESOPHAGOGASTRODUODENOSCOPY (EGD) WITH ESOPHAGEAL DILATION  ~ 1982   Dr. Jarold Motto   EXCISION OF ACTINIC KERATOSIS     DR LUPTON    INSERT / REPLACE / REMOVE PACEMAKER   06/21/2016   St Jude   KNEE ARTHROSCOPY Left 2003   KNEE ARTHROSCOPY Right 06/26/2013   KNEE CLOSED REDUCTION Right 10/23/2013   Procedure: CLOSED MANIPULATION RIGHT KNEE;  Surgeon: Kathryne Hitch, MD;  Location: [facility] OR;  Service: Orthopedics;  Laterality: Right;   LASER FOR CLOUDY CAP LEFT EYE Left 03/2006   DR DIGBY   LUMBAR LAMINECTOMY/DECOMPRESSION MICRODISCECTOMY N/A 06/12/2019   Procedure: Laminectomy and Foraminotomy - Lumbar four-Lumbar five;  Surgeon: Tia Alert, MD;  Location: [facility] OR;  Service: Neurosurgery;  Laterality: N/A;   LUMBAR LAMINECTOMY/DECOMPRESSION MICRODISCECTOMY Bilateral 07/16/2023   Procedure: Right- - Lumbar three-Lumbar four - Lumbar four-Lumbar five Decompressive laminectomy;  Surgeon:  Arman Bogus, MD;  Location: [facility] OR;  Service: Neurosurgery;  Laterality: Bilateral;   SKIN SURGERY  04/2022   [facility], Dr.Gray. Removed area on upper chest   TOTAL KNEE ARTHROPLASTY Left 04/2004   DR RENDALL   TOTAL KNEE ARTHROPLASTY Right 07/04/2013   Procedure: RIGHT TOTAL KNEE ARTHROPLASTY;  Surgeon: Kathryne Hitch, MD;  Location: [facility] ORS;  Service: Orthopedics;  Laterality: Right;   TRIGGER FINGER RELEASE Right 09/2018   VAGINAL HYSTERECTOMY  1979     Social History:   reports that she has never smoked. She has never used smokeless tobacco. She reports that she does not drink alcohol and does not use drugs.  Family History  Problem Relation Age of Onset   Ovarian cancer Mother    Uterine cancer Mother    Cerebrovascular Accident Mother    Heart disease Father    Hypertension Brother    Obesity Daughter      Medications:  Patient's Medications  New Prescriptions   No medications on file  Previous Medications   ACETAMINOPHEN (TYLENOL) 500 MG TABLET    Take 500-1,000 mg by mouth every 6 (six) hours as needed for moderate pain.   ANTISEPTIC ORAL RINSE (BIOTENE) LIQD    15 mLs by Mouth Rinse route 2 (two) times daily.   APIXABAN  (ELIQUIS) 5 MG TABS TABLET    Take 1 tablet (5 mg total) by mouth 2 (two) times daily.   ARTIFICIAL SALIVA (ACT DRY MOUTH) LOZG    Use as directed 1 drop in the mouth or throat as needed (dry mouth).   BIMATOPROST (LUMIGAN) 0.01 % SOLN    Place 1 drop into both eyes at bedtime.    CALCIUM CITRATE-VITAMIN D (CALCIUM + D PO)    Take 1 tablet by mouth in the morning.   CHOLECALCIFEROL (VITAMIN D3 SUPER STRENGTH) 50 MCG (2000 UT) TABS    Take 2,000 Units by mouth daily.   CYANOCOBALAMIN (VITAMIN B12) 1000 MCG TABLET    Take 1,000 mcg by mouth in the morning.   DENTIFRICES (BIOTENE DRY MOUTH GENTLE) PSTE    Place 1 Application onto teeth in the morning and at bedtime.   DOCUSATE CALCIUM (STOOL SOFTENER PO)    Take 100 mg by mouth 3 (three) times a week. At night   DORZOLAMIDE (TRUSOPT) 2 % OPHTHALMIC SOLUTION    Place 1 drop into both eyes 3 (three) times daily.   ESOMEPRAZOLE (NEXIUM) 20 MG CAPSULE    TAKE 1 CAPSULE (20 MG TOTAL) BY MOUTH AS NEEDED   EZETIMIBE (ZETIA) 10 MG TABLET    TAKE ONE TABLET BY MOUTH ONCE DAILY   GLYCERIN, LAXATIVE, (ADULT SUPPOSITORY RE)    Place 1 suppository rectally daily as needed (constipation.).   HYDROCODONE-ACETAMINOPHEN (NORCO/VICODIN) 5-325 MG TABLET    Take 1 tablet by mouth every 4 (four) hours as needed for moderate pain.   HYDROQUINONE 4 % CREAM    Apply topically daily.   MAGNESIUM OXIDE (MAGNESIUM EXTRA STRENGTH PO)    Apply 1 Application topically as needed (knee pain.). MAGNESIUM OIL   METHYLCELLULOSE (ARTIFICIAL TEARS) 1 % OPHTHALMIC SOLUTION    Place 1 drop into both eyes 3 (three) times daily as needed (for dry eyes).   METOPROLOL TARTRATE (LOPRESSOR) 25 MG TABLET    Take 1 tablet (25 mg total) by mouth 2 (two) times daily. Please call [phone] to schedule a May appointment with Dr. Lewayne Bunting for future refills. Thank you.   OVER THE  COUNTER MEDICATION    Take 1 capsule by mouth 2 (two) times daily. HydroEye Softgels Dry Eye Relief   PROPAFENONE  (RYTHMOL SR) 225 MG 12 HR CAPSULE    TAKE 1 CAPSULE BY MOUTH TWICE A DAY   SODIUM CHLORIDE (OCEAN) 0.65 % SOLN NASAL SPRAY    Place 1 spray into both nostrils 2 (two) times daily as needed (dry nasal passages). [employer] Saline Nasal Moisturizing Spray   TRAMADOL (ULTRAM) 50 MG TABLET    Take by mouth.   TRAZODONE (DESYREL) 50 MG TABLET    TAKE 1 TABLET BY MOUTH EVERYDAY AT BEDTIME   VALSARTAN-HYDROCHLOROTHIAZIDE (DIOVAN-HCT) 320-25 MG TABLET    TAKE ONE TABLET BY MOUTH DAILY  Modified Medications   No medications on file  Discontinued Medications   No medications on file     Physical Exam:  Vitals:   02/18/24 0855  BP: 130/78  Pulse: 76  Temp: (!) 97.3 F (36.3 C)  TempSrc: Temporal  SpO2: 96%  Weight: 182 lb (82.6 kg)  Height: 5\' 2"  (1.575 m)   Body mass index is 33.29 kg/m.  Wt Readings from Last 3 Encounters:  02/18/24 182 lb (82.6 kg)  01/03/24 181 lb (82.1 kg)  10/15/23 183 lb 6.4 oz (83.2 kg)     Physical Exam Constitutional:      Appearance: Normal appearance.  Cardiovascular:     Rate and Rhythm: Normal rate and regular rhythm.     Pulses: Normal pulses.     Heart sounds: Normal heart sounds.  Pulmonary:     Effort: Pulmonary effort is normal.     Breath sounds: Normal breath sounds.  Abdominal:     General: Bowel sounds are normal.     Palpations: Abdomen is soft.  Skin:    General: Skin is warm and dry.  Neurological:     General: No focal deficit present.     Mental Status: She is alert and oriented to person, place, and time.  Psychiatric:        Mood and Affect: Mood normal.        Behavior: Behavior normal.     Labs reviewed: Basic Metabolic Panel:  Recent Labs    04/23/23 1340 07/06/23 1400  NA 140 139  K 3.8 3.8  CL 100 101  CO2 29 30  GLUCOSE 99 103*  BUN 17 15  CREATININE 0.80 0.76  CALCIUM 9.3 9.1   Liver Function Tests: No results for input(s): "AST", "ALT", "ALKPHOS", "BILITOT", "PROT", "ALBUMIN" in the last 8760 hours. No results for  input(s): "LIPASE", "AMYLASE" in the last 8760 hours. No results for input(s): "AMMONIA" in the last 8760 hours. CBC:  Recent Labs    04/23/23 1340 07/06/23 1400  WBC 7.0 6.3  HGB 12.3 13.0  HCT 38.2 39.9  MCV 96.0 95.5  PLT 170 166   Lipid Panel: No results for input(s): "CHOL", "HDL", "LDLCALC", "TRIG", "CHOLHDL", "LDLDIRECT" in the last 8760 hours. TSH: No results for input(s): "TSH" in the last 8760 hours. A1C:  Lab Results  Component Value Date   HGBA1C 5.2 08/06/2017     Assessment/Plan   1. Essential hypertension (Primary) -Controlled, BP at goal, <140/90 -Continue current medications -Encouraged dietary modifications and physical activity as tolerated -CBC, CMP, TSH  2. Paroxysmal atrial fibrillation (HCC) -Rate/rhythm controlled today -S/p PPM -Had cardioversion in December 2023 -Followed by cardiology, continue current medication recommendations  3. Spinal stenosis of lumbar region with neurogenic claudication -S/p surgery, stopped PT briefly, plans to  restart soon -Taking PRN tylenol for ongoing right leg pain  4. Hyperlipidemia, unspecified hyperlipidemia type -Continue ezetimibe -Encouraged dietary modifications and physical activity as tolerated -Lipid panel  5. Osteoporosis, unspecified osteoporosis type, unspecified pathological fracture presence -Continue Vitamin D/Calcium -Encourage weight bearing exercises as tolerated  6. Gastroesophageal reflux disease without esophagitis -Symptoms controlled on Nexium, using infrequently  7. Primary insomnia -Refill trazodone today  -Encouraged proper sleep hygiene   8. Constipation, unspecified constipation type -Managed with stool softeners -Encouraged proper nutrition and hydration  Return in about 6 months (around 08/17/2024) for routine follow up, labs at time of visit.  Rollen Sox, Haroldine Laws MSN-FNP Student -I personally was present during the history, physical exam and medical  decision-making activities of this service and have verified that the service and findings are accurately documented in the student's note  Summers Buendia K. Biagio Borg [facility] [phone]

## 2024-02-19 ENCOUNTER — Encounter: Payer: Self-pay | Admitting: Nurse Practitioner

## 2024-02-19 LAB — CBC WITH DIFFERENTIAL/PLATELET
Absolute Lymphocytes: 2607 {cells}/uL (ref 850–3900)
Absolute Monocytes: 488 {cells}/uL (ref 200–950)
Basophils Absolute: 39 {cells}/uL (ref 0–200)
Basophils Relative: 0.6 %
Eosinophils Absolute: 163 {cells}/uL (ref 15–500)
Eosinophils Relative: 2.5 %
HCT: 38.6 % (ref 35.0–45.0)
Hemoglobin: 12.8 g/dL (ref 11.7–15.5)
MCH: 30.3 pg (ref 27.0–33.0)
MCHC: 33.2 g/dL (ref 32.0–36.0)
MCV: 91.5 fL (ref 80.0–100.0)
MPV: 10.3 fL (ref 7.5–12.5)
Monocytes Relative: 7.5 %
Neutro Abs: 3205 {cells}/uL (ref 1500–7800)
Neutrophils Relative %: 49.3 %
Platelets: 190 10*3/uL (ref 140–400)
RBC: 4.22 10*6/uL (ref 3.80–5.10)
RDW: 12.9 % (ref 11.0–15.0)
Total Lymphocyte: 40.1 %
WBC: 6.5 10*3/uL (ref 3.8–10.8)

## 2024-02-19 LAB — COMPLETE METABOLIC PANEL WITH GFR
AG Ratio: 1.4 (calc) (ref 1.0–2.5)
ALT: 17 U/L (ref 6–29)
AST: 20 U/L (ref 10–35)
Albumin: 4 g/dL (ref 3.6–5.1)
Alkaline phosphatase (APISO): 47 U/L (ref 37–153)
BUN/Creatinine Ratio: 22 (calc) (ref 6–22)
BUN: 21 mg/dL (ref 7–25)
CO2: 28 mmol/L (ref 20–32)
Calcium: 9.2 mg/dL (ref 8.6–10.4)
Chloride: 104 mmol/L (ref 98–110)
Creat: 0.96 mg/dL — ABNORMAL HIGH (ref 0.60–0.95)
Globulin: 2.9 g/dL (ref 1.9–3.7)
Glucose, Bld: 97 mg/dL (ref 65–139)
Potassium: 3.6 mmol/L (ref 3.5–5.3)
Sodium: 141 mmol/L (ref 135–146)
Total Bilirubin: 0.4 mg/dL (ref 0.2–1.2)
Total Protein: 6.9 g/dL (ref 6.1–8.1)
eGFR: 56 mL/min/{1.73_m2} — ABNORMAL LOW (ref >=60)

## 2024-02-19 LAB — LIPID PANEL
Cholesterol: 183 mg/dL (ref <200)
HDL: 36 mg/dL — ABNORMAL LOW (ref >=50)
LDL Cholesterol (Calc): 105 mg/dL — ABNORMAL HIGH
Non-HDL Cholesterol (Calc): 147 mg/dL — ABNORMAL HIGH (ref <130)
Total CHOL/HDL Ratio: 5.1 (calc) — ABNORMAL HIGH (ref <5.0)
Triglycerides: 291 mg/dL — ABNORMAL HIGH (ref <150)

## 2024-02-19 LAB — TSH: TSH: 1.93 m[IU]/L (ref 0.40–4.50)

## 2024-02-19 MED ORDER — TRAZODONE HCL 50 MG PO TABS: 25.0000 mg | ORAL_TABLET | Freq: Every evening | ORAL | 1 refills | Status: DC | PRN

## 2024-02-20 ENCOUNTER — Other Ambulatory Visit: Payer: Self-pay | Admitting: Internal Medicine

## 2024-03-13 DIAGNOSIS — H401131 Primary open-angle glaucoma, bilateral, mild stage: Secondary | ICD-10-CM | POA: Diagnosis not present

## 2024-03-13 DIAGNOSIS — Z961 Presence of intraocular lens: Secondary | ICD-10-CM | POA: Diagnosis not present

## 2024-03-13 DIAGNOSIS — H0102B Squamous blepharitis left eye, upper and lower eyelids: Secondary | ICD-10-CM | POA: Diagnosis not present

## 2024-03-13 DIAGNOSIS — H16223 Keratoconjunctivitis sicca, not specified as Sjogren's, bilateral: Secondary | ICD-10-CM | POA: Diagnosis not present

## 2024-03-13 DIAGNOSIS — H0102A Squamous blepharitis right eye, upper and lower eyelids: Secondary | ICD-10-CM | POA: Diagnosis not present

## 2024-03-18 ENCOUNTER — Encounter: Payer: Self-pay | Admitting: Internal Medicine

## 2024-03-18 ENCOUNTER — Ambulatory Visit (INDEPENDENT_AMBULATORY_CARE_PROVIDER_SITE_OTHER): Payer: Medicare Other

## 2024-03-18 DIAGNOSIS — I495 Sick sinus syndrome: Secondary | ICD-10-CM | POA: Diagnosis not present

## 2024-03-18 LAB — CUP PACEART REMOTE DEVICE CHECK
Battery Remaining Longevity: 26 mo
Battery Remaining Percentage: 27 %
Battery Voltage: 2.9 V
Brady Statistic AP VP Percent: 5.9 %
Brady Statistic AP VS Percent: 93 %
Brady Statistic AS VP Percent: 1 %
Brady Statistic AS VS Percent: 1 %
Brady Statistic RA Percent Paced: 99 %
Brady Statistic RV Percent Paced: 6.4 %
Date Time Interrogation Session: 20250325020012
Implantable Lead Connection Status: 753985
Implantable Lead Connection Status: 753985
Implantable Lead Implant Date: 20170628
Implantable Lead Implant Date: 20170628
Implantable Lead Location: 753859
Implantable Lead Location: 753860
Implantable Pulse Generator Implant Date: 20170628
Lead Channel Impedance Value: 480 Ohm
Lead Channel Impedance Value: 550 Ohm
Lead Channel Pacing Threshold Amplitude: 0.75 V
Lead Channel Pacing Threshold Amplitude: 1.5 V
Lead Channel Pacing Threshold Pulse Width: 0.5 ms
Lead Channel Pacing Threshold Pulse Width: 0.5 ms
Lead Channel Sensing Intrinsic Amplitude: 1.2 mV
Lead Channel Sensing Intrinsic Amplitude: 9.9 mV
Lead Channel Setting Pacing Amplitude: 2 V
Lead Channel Setting Pacing Amplitude: 3 V
Lead Channel Setting Pacing Pulse Width: 0.5 ms
Lead Channel Setting Sensing Sensitivity: 2 mV
Pulse Gen Model: 2272
Pulse Gen Serial Number: 7910313

## 2024-03-24 ENCOUNTER — Other Ambulatory Visit: Payer: Self-pay | Admitting: Internal Medicine

## 2024-03-31 ENCOUNTER — Ambulatory Visit: Admitting: Physician Assistant

## 2024-03-31 DIAGNOSIS — M65332 Trigger finger, left middle finger: Secondary | ICD-10-CM | POA: Diagnosis not present

## 2024-03-31 MED ORDER — LIDOCAINE HCL 1 % IJ SOLN
0.5000 mL | INTRAMUSCULAR | Status: AC | PRN
Start: 2024-03-31 — End: 2024-03-31
  Administered 2024-03-31: .5 mL

## 2024-03-31 MED ORDER — METHYLPREDNISOLONE ACETATE 40 MG/ML IJ SUSP
20.0000 mg | INTRAMUSCULAR | Status: AC | PRN
  Administered 2024-03-31: 20 mg

## 2024-03-31 NOTE — Progress Notes (Signed)
   Procedure Note  Patient: [name]             Date of Birth: Jul 18, 1931           MRN: [id number]             Visit Date: 03/31/2024 HPI: [name] comes in today due to triggering left third finger.  Began about a month ago.  No injury.  She has tried some stretching.  She is nondiabetic.  Denies any fevers chills.  Physical exam: General well-developed well-nourished female no acute distress. Left finger active triggering.  Tenderness with a palpable nodule at the A1 pulley of the middle finger.  Plan: I offered patient injection for the trigger finger which she is agreeable with.  She will also apply Voltaren gel 2 g 4 times daily for the next week.  Follow-up if pain persist at times worse.  Questions were encouraged and answered. Procedures: Visit Diagnoses:  1. Trigger middle finger of left hand     Hand/UE Inj: L long A1 for trigger finger on 03/31/2024 11:42 AM Details: 25 G needle Medications: 0.5 mL lidocaine 1 %; 20 mg methylPREDNISolone acetate 40 MG/ML Consent was given by the patient. Immediately prior to procedure a time out was called to verify the correct patient, procedure, equipment, support staff and site/side marked as required. Patient was prepped and draped in the usual sterile fashion.

## 2024-04-01 ENCOUNTER — Telehealth: Payer: Self-pay | Admitting: Internal Medicine

## 2024-04-01 MED ORDER — METOPROLOL TARTRATE 25 MG PO TABS: 25.0000 mg | ORAL_TABLET | Freq: Two times a day (BID) | ORAL | 2 refills | Status: DC

## 2024-04-01 NOTE — Telephone Encounter (Signed)
 Pt's medication was sent to pt's pharmacy as requested. Confirmation received.

## 2024-04-01 NOTE — Telephone Encounter (Signed)
 *  STAT* If patient is at the pharmacy, call can be transferred to refill team.   1. Which medications need to be refilled? (please list name of each medication and dose if known)   metoprolol tartrate (LOPRESSOR) 25 MG tablet   2. Which pharmacy/location (including street and city if local pharmacy) is medication to be sent to?  [id number] - [place], [place] - [address]    3. Do they need a 30 day or 90 day supply? 30  Patient will need enough refills to cover her until her appointment with Francis Dowse on 06/04/24

## 2024-04-02 DIAGNOSIS — H16223 Keratoconjunctivitis sicca, not specified as Sjogren's, bilateral: Secondary | ICD-10-CM | POA: Diagnosis not present

## 2024-04-02 DIAGNOSIS — H0102A Squamous blepharitis right eye, upper and lower eyelids: Secondary | ICD-10-CM | POA: Diagnosis not present

## 2024-04-02 DIAGNOSIS — H401131 Primary open-angle glaucoma, bilateral, mild stage: Secondary | ICD-10-CM | POA: Diagnosis not present

## 2024-04-02 DIAGNOSIS — H0102B Squamous blepharitis left eye, upper and lower eyelids: Secondary | ICD-10-CM | POA: Diagnosis not present

## 2024-04-12 ENCOUNTER — Other Ambulatory Visit: Payer: Self-pay | Admitting: Internal Medicine

## 2024-04-25 ENCOUNTER — Other Ambulatory Visit: Payer: Self-pay | Admitting: Internal Medicine

## 2024-05-01 NOTE — Progress Notes (Signed)
 Remote pacemaker transmission.

## 2024-06-01 NOTE — Progress Notes (Unsigned)
 Cardiology Office Note Date:  06/01/2024  Patient ID:  [name], [name] 08/21/1931, MRN [id number] PCP:  Verma Gobble, NP  Cardiologist:  Dr. Swaziland Electrophysiologist: Dr. Carolynne Citron    Chief Complaint: *** annual visit  History of Present Illness: [name] is a 89 y.o. female with history of  SND w/PPM,  AFib/atypical AFlutter,  spinal stenosis/sciatica  She saw Dr. Carolynne Citron 11/10/22, she had recent back surgery with improvement in the weakness in her legs, recent increase in fatigue that corresponded to onset of AFib in the recent weeks. Discussed management strategies for rhythm control, felt to be minimally symptomatic, planned watchful waiting with consideration of dofetilide or amiodarone as next AAD options. AP 99% Advised her to call should she want to purse change.  She did eventually reports some increased SOB to Dr. Swaziland and underwent DCCV 12/04/22. She saw Dr. Swaziland again 12/28/22 maintaining SR and feeling markedly improved. No changes were made  I saw her Feb 2024 She is accompanied by her husband.  She is mostly bothered by her RLE pain/sciatica with back trouble.  She is pending next wee an injection for her back, she is currently on prednisone  for it. She thinks she may have had some Afib, connects this to a sense of upset stomach, perhaps a sense that she has to take a deep breath.  She has some palpitations, these are usually momentary/brief. NO near syncope or suncope. No bleeding or sogns of bleeding No CP Stable intervals No changes made  She saw Dr. Carolynne Citron 05/24/23, struggling with back trouble, pending more surgery, she was back in AFib 99% APaced If AFib were to become persistent > change AAD Felt reasonable to proceed with her surgery  Seeing Dr. Swaziland a couple times since then, most recently 01/03/24, Dec remote with <1% AF burden. Back initially had done better but having recurrent LE symptoms unfortunately  TODAY  *** propafenone ,  EKG, nodal blocker  *** eliquis , dose, bleeding, labs *** more recent visits given AAD   Device information Abbott dual chamber PPM implanted 06/21/2016  AFib/AAD hx AFib diagnosed 2017 AAD propafenone  started 2017  Past Medical History:  Diagnosis Date   A-fib [facility])    Allergic rhinitis due to pollen    Anxiety state, unspecified    Sitautional- pain   Arthritis    "right knee" (06/21/2016)   Asymptomatic varicose veins    Carpal tunnel syndrome    Chest pain, unspecified    Chronic lower back pain    "on the left side" (06/21/2016)   Constipation    Cramp of limb    Depressive disorder, not elsewhere classified    pt denies   Diaphragmatic hernia without mention of obstruction or gangrene    Diaphragmatic hernia without mention of obstruction or gangrene    Disturbance of skin sensation    Diverticulosis of colon (without mention of hemorrhage)    Dysrhythmia    Atrial Flutter   Enthesopathy of hip region    Esophageal reflux    , just occasional   History of blood transfusion    "w/both knee replacements"   History of duodenal ulcer    History of kidney stones    passed   Insomnia, unspecified    Lumbago    Migraine    "none since ~ 1990" (06/21/2016)   Myalgia and myositis, unspecified    Obesity, unspecified    Other abnormal blood chemistry    Other dysphagia  Other malaise and fatigue    Other nonspecific abnormal serum enzyme levels    Other specified cardiac dysrhythmias(427.89)    Pacemaker    Pain in joint, ankle and foot    Pain in joint, hand    Pain in joint, lower leg    Pain in joint, pelvic region and thigh    Pain in limb    Plantar fascial fibromatosis    Presence of permanent cardiac pacemaker    -St Jude   Reflux esophagitis    Sciatica    Spinal stenosis, unspecified region other than cervical    Stricture and stenosis of esophagus    Symptomatic menopausal or female climacteric states    Unspecified essential hypertension     Unspecified essential hypertension    Unspecified vitamin D  deficiency     Past Surgical History:  Procedure Laterality Date   BREAST BIOPSY Left 1990s X 2   CARDIOVERSION N/A 04/26/2016   Procedure: CARDIOVERSION;  Surgeon: Hazle Lites, MD;  Location: [facility] ENDOSCOPY;  Service: Cardiovascular;  Laterality: N/A;   CARDIOVERSION N/A 12/04/2022   Procedure: CARDIOVERSION;  Surgeon: Euell Herrlich, MD;  Location: [facility] ENDOSCOPY;  Service: Cardiovascular;  Laterality: N/A;   CATARACT EXTRACTION W/ INTRAOCULAR LENS IMPLANT Left 08/03/1999   DR EPES    CATARACT EXTRACTION W/ INTRAOCULAR LENS IMPLANT Right 2000   DR EPES   CHOLECYSTECTOMY OPEN  1989   DR BOWMAN   COLONOSCOPY  1988   Normal    DENTAL SURGERY Left 08/2016   EP IMPLANTABLE DEVICE N/A 06/21/2016   Procedure: Pacemaker Implant;  Surgeon: Tammie Fall, MD;  Location: [facility] INVASIVE CV LAB;  Service: Cardiovascular;  Laterality: N/A;   ESOPHAGOGASTRODUODENOSCOPY (EGD) WITH ESOPHAGEAL DILATION  ~ 1982   Dr. Adan Holms   EXCISION OF ACTINIC KERATOSIS     DR LUPTON    INSERT / REPLACE / REMOVE PACEMAKER  06/21/2016   St Jude   KNEE ARTHROSCOPY Left 2003   KNEE ARTHROSCOPY Right 06/26/2013   KNEE CLOSED REDUCTION Right 10/23/2013   Procedure: CLOSED MANIPULATION RIGHT KNEE;  Surgeon: Arnie Lao, MD;  Location: [facility] OR;  Service: Orthopedics;  Laterality: Right;   LASER FOR CLOUDY CAP LEFT EYE Left 03/2006   DR DIGBY   LUMBAR LAMINECTOMY/DECOMPRESSION MICRODISCECTOMY N/A 06/12/2019   Procedure: Laminectomy and Foraminotomy - Lumbar four-Lumbar five;  Surgeon: Isadora Mar, MD;  Location: [facility] OR;  Service: Neurosurgery;  Laterality: N/A;   LUMBAR LAMINECTOMY/DECOMPRESSION MICRODISCECTOMY Bilateral 07/16/2023   Procedure: Right- - Lumbar three-Lumbar four - Lumbar four-Lumbar five Decompressive laminectomy;  Surgeon: Joaquin Mulberry, MD;  Location: [facility] OR;  Service: Neurosurgery;  Laterality: Bilateral;   SKIN SURGERY   04/2022   [facility], Dr.Gray. Removed area on upper chest   TOTAL KNEE ARTHROPLASTY Left 04/2004   DR RENDALL   TOTAL KNEE ARTHROPLASTY Right 07/04/2013   Procedure: RIGHT TOTAL KNEE ARTHROPLASTY;  Surgeon: Arnie Lao, MD;  Location: [facility] ORS;  Service: Orthopedics;  Laterality: Right;   TRIGGER FINGER RELEASE Right 09/2018   VAGINAL HYSTERECTOMY  1979    Current Outpatient Medications  Medication Sig Dispense Refill   acetaminophen  (TYLENOL ) 500 MG tablet Take 500-1,000 mg by mouth every 6 (six) hours as needed for moderate pain.     antiseptic oral rinse (BIOTENE) LIQD 15 mLs by Mouth Rinse route 2 (two) times daily.     apixaban  (ELIQUIS ) 5 MG TABS tablet Take 1 tablet (5 mg total) by mouth 2 (two)  times daily. 60 tablet 11   Artificial Saliva (ACT DRY MOUTH) LOZG Use as directed 1 drop in the mouth or throat as needed (dry mouth).     bimatoprost  (LUMIGAN ) 0.01 % SOLN Place 1 drop into both eyes at bedtime.      Calcium  Citrate-Vitamin D  (CALCIUM  + D PO) Take 1 tablet by mouth in the morning.     Cholecalciferol  (VITAMIN D3 SUPER STRENGTH) 50 MCG (2000 UT) TABS Take 2,000 Units by mouth daily.     cyanocobalamin (VITAMIN B12) 1000 MCG tablet Take 1,000 mcg by mouth in the morning.     Dentifrices (BIOTENE DRY MOUTH GENTLE) PSTE Place 1 Application onto teeth in the morning and at bedtime.     Docusate Calcium  (STOOL SOFTENER PO) Take 100 mg by mouth 3 (three) times a week. At night     dorzolamide  (TRUSOPT ) 2 % ophthalmic solution Place 1 drop into both eyes 3 (three) times daily.     esomeprazole  (NEXIUM ) 20 MG capsule TAKE 1 CAPSULE (20 MG TOTAL) BY MOUTH AS NEEDED 90 capsule 1   ezetimibe  (ZETIA ) 10 MG tablet TAKE ONE TABLET BY MOUTH ONCE DAILY 90 tablet 3   Glycerin, Laxative, (ADULT SUPPOSITORY RE) Place 1 suppository rectally daily as needed (constipation.).     HYDROcodone -acetaminophen  (NORCO/VICODIN) 5-325 MG tablet Take 1 tablet by mouth every 4 (four) hours  as needed for moderate pain. 20 tablet 0   hydroquinone  4 % cream Apply topically daily. (Patient taking differently: Apply 1 Application topically every other day.) 28.35 g 0   Magnesium  Oxide (MAGNESIUM  EXTRA STRENGTH PO) Apply 1 Application topically as needed (knee pain.). MAGNESIUM  OIL     methylcellulose (ARTIFICIAL TEARS) 1 % ophthalmic solution Place 1 drop into both eyes 3 (three) times daily as needed (for dry eyes).     metoprolol  tartrate (LOPRESSOR ) 25 MG tablet TAKE 1 TABLET (25 MG TOTAL) BY MOUTH 2 (TWO) TIMES DAILY. PLEASE CALL [phone] FOR APPOINTMENT 30 tablet 2   OVER THE COUNTER MEDICATION Take 1 capsule by mouth 2 (two) times daily. HydroEye Softgels Dry Eye Relief     propafenone  (RYTHMOL  SR) 225 MG 12 hr capsule TAKE 1 CAPSULE BY MOUTH TWICE A DAY 180 capsule 2   sodium chloride  (OCEAN) 0.65 % SOLN nasal spray Place 1 spray into both nostrils 2 (two) times daily as needed (dry nasal passages). [employer] Saline Nasal Moisturizing Spray     traMADol  (ULTRAM ) 50 MG tablet Take by mouth.     traZODone  (DESYREL ) 50 MG tablet Take 0.5-1 tablets (25-50 mg total) by mouth at bedtime as needed (pain.). 90 tablet 1   valsartan -hydrochlorothiazide  (DIOVAN -HCT) 320-25 MG tablet TAKE ONE TABLET BY MOUTH DAILY 90 tablet 3   No current facility-administered medications for this visit.    Allergies:   Advil [ibuprofen], Aleve [naproxen sodium], Aspirin , Atorvastatin , Brimonidine, Celebrex [celecoxib], Diclofenac, Doxycycline , Nsaids, Other, Pravastatin , Timolol, and Metoclopramide  hcl   Social History:  The patient  reports that she has never smoked. She has never used smokeless tobacco. She reports that she does not drink alcohol  and does not use drugs.   Family History:  The patient's family history includes Cerebrovascular Accident in her mother; Heart disease in her father; Hypertension in her brother; Obesity in her daughter; Ovarian cancer in her mother; Uterine cancer in her  mother.  ROS:  Please see the history of present illness.    All other systems are reviewed and otherwise negative.   PHYSICAL EXAM:  VS:  There were no vitals taken for this visit. BMI: There is no height or weight on file to calculate BMI. Well nourished, well developed, in no acute distress HEENT: normocephalic, atraumatic Neck: no JVD, carotid bruits or masses Cardiac: *** RRR; no significant murmurs, no rubs, or gallops Lungs:  *** CTA b/l, no wheezing, rhonchi or rales Abd: soft, nontender MS: no deformity or atrophy Ext: *** trace edema Skin: warm and dry, no rash Neuro:  No gross deficits appreciated Psych: euthymic mood, full affect  *** PPM site is stable, no tethering or discomfort   EKG:  done today and reviwed by myself ***  Device interrogation done today and reviewed by myself:  *** Battery and lead measurements are good/stable ***   09/30/20: TTE 1. Left ventricular ejection fraction, by estimation, is 55 to 60%. The  left ventricle has normal function. The left ventricle has no regional  wall motion abnormalities. There is mild left ventricular hypertrophy.  Left ventricular diastolic parameters  are consistent with Grade I diastolic dysfunction (impaired relaxation).  The E/e' is 11.   2. Right ventricular systolic function is normal. The right ventricular  size is normal.   3. Left atrial size was moderately dilated.   4. The mitral valve is grossly normal. No evidence of mitral valve  regurgitation.   5. The aortic valve is tricuspid. There is mild calcification of the  aortic valve. Aortic valve regurgitation is not visualized.   6. The inferior vena cava is normal in size with greater than 50%  respiratory variability, suggesting right atrial pressure of 3 mmHg.   Comparison(s): No significant change from prior study.   Recent Labs: 02/18/2024: ALT 17; BUN 21; Creat 0.96; Hemoglobin 12.8; Platelets 190; Potassium 3.6; Sodium 141; TSH 1.93   02/18/2024: Cholesterol 183; HDL 36; LDL Cholesterol (Calc) 105; Total CHOL/HDL Ratio 5.1; Triglycerides 291   CrCl cannot be calculated (Patient's most recent lab result is older than the maximum 21 days allowed.).   Wt Readings from Last 3 Encounters:  02/18/24 182 lb (82.6 kg)  01/03/24 181 lb (82.1 kg)  10/15/23 183 lb 6.4 oz (83.2 kg)     Other studies reviewed: Additional studies/records reviewed today include: summarized above  ASSESSMENT AND PLAN:  PPM Intact function No programming changes made  Persistent AFib CHA2DS2Vasc is 3, on Eliquis , *** appropriately dosed *** Stable intervals on propafenone  ***   Secondary hypercoagulable   Disposition: F/u  ***, sooner if needed  Current medicines are reviewed at length with the patient today.  The patient did not have any concerns regarding medicines.  [place], PA-C 06/01/2024 5:26 PM     [facility] [address] [place] [address] [phone] (office)  [phone] (fax)

## 2024-06-04 ENCOUNTER — Ambulatory Visit: Attending: Cardiology | Admitting: Physician Assistant

## 2024-06-04 ENCOUNTER — Encounter: Payer: Self-pay | Admitting: Physician Assistant

## 2024-06-04 VITALS — BP 124/86 | HR 64 | Ht 62.0 in | Wt 181.4 lb

## 2024-06-04 DIAGNOSIS — I10 Essential (primary) hypertension: Secondary | ICD-10-CM | POA: Diagnosis not present

## 2024-06-04 DIAGNOSIS — D6869 Other thrombophilia: Secondary | ICD-10-CM | POA: Diagnosis not present

## 2024-06-04 DIAGNOSIS — I4819 Other persistent atrial fibrillation: Secondary | ICD-10-CM

## 2024-06-04 DIAGNOSIS — I495 Sick sinus syndrome: Secondary | ICD-10-CM

## 2024-06-04 DIAGNOSIS — Z950 Presence of cardiac pacemaker: Secondary | ICD-10-CM | POA: Diagnosis not present

## 2024-06-04 DIAGNOSIS — Z79899 Other long term (current) drug therapy: Secondary | ICD-10-CM

## 2024-06-04 LAB — CUP PACEART INCLINIC DEVICE CHECK
Battery Remaining Longevity: 28 mo
Battery Voltage: 2.9 V
Brady Statistic RA Percent Paced: 99 %
Brady Statistic RV Percent Paced: 8 %
Date Time Interrogation Session: 20250611175614
Implantable Lead Connection Status: 753985
Implantable Lead Connection Status: 753985
Implantable Lead Implant Date: 20170628
Implantable Lead Implant Date: 20170628
Implantable Lead Location: 753859
Implantable Lead Location: 753860
Implantable Pulse Generator Implant Date: 20170628
Lead Channel Impedance Value: 525 Ohm
Lead Channel Impedance Value: 575 Ohm
Lead Channel Pacing Threshold Amplitude: 0.75 V
Lead Channel Pacing Threshold Amplitude: 0.75 V
Lead Channel Pacing Threshold Amplitude: 1.5 V
Lead Channel Pacing Threshold Amplitude: 1.5 V
Lead Channel Pacing Threshold Pulse Width: 0.5 ms
Lead Channel Pacing Threshold Pulse Width: 0.5 ms
Lead Channel Pacing Threshold Pulse Width: 0.5 ms
Lead Channel Pacing Threshold Pulse Width: 0.5 ms
Lead Channel Sensing Intrinsic Amplitude: 1.5 mV
Lead Channel Sensing Intrinsic Amplitude: 10 mV
Lead Channel Setting Pacing Amplitude: 2 V
Lead Channel Setting Pacing Amplitude: 3 V
Lead Channel Setting Pacing Pulse Width: 0.5 ms
Lead Channel Setting Sensing Sensitivity: 2 mV
Pulse Gen Model: 2272
Pulse Gen Serial Number: 7910313

## 2024-06-04 MED ORDER — METOPROLOL TARTRATE 25 MG PO TABS: 25.0000 mg | ORAL_TABLET | Freq: Two times a day (BID) | ORAL | 3 refills | Status: AC

## 2024-06-04 NOTE — Patient Instructions (Addendum)
 Medication Instructions:   Your physician recommends that you continue on your current medications as directed. Please refer to the Current Medication list given to you today.   *If you need a refill on your cardiac medications before your next appointment, please call your pharmacy*   Lab Work: NONE ORDERED  TODAY    If you have labs (blood work) drawn today and your tests are completely normal, you will receive your results only by: MyChart Message (if you have MyChart) OR A paper copy in the mail If you have any lab test that is abnormal or we need to change your treatment, we will call you to review the results.   Testing/Procedures:  NONE ORDERED  TODAY    Follow-Up: At [facility], you and your health needs are our priority.  As part of our continuing mission to provide you with exceptional heart care, our providers are all part of one team.  This team includes your primary Cardiologist (physician) and Advanced Practice Providers or APPs (Physician Assistants and Nurse Practitioners) who all work together to provide you with the care you need, when you need it.   Your next appointment:    1 year(s)   Provider:    You may see    Mertha Abrahams, PA-C   We recommend signing up for the patient portal called MyChart.  Sign up information is provided on this After Visit Summary.  MyChart is used to connect with patients for Virtual Visits (Telemedicine).  Patients are able to view lab/test results, encounter notes, upcoming appointments, etc.  Non-urgent messages can be sent to your provider as well.   To learn more about what you can do with MyChart, go to [email or web address].    Other Instructions       MedOther Instructions

## 2024-06-17 ENCOUNTER — Ambulatory Visit (INDEPENDENT_AMBULATORY_CARE_PROVIDER_SITE_OTHER): Payer: Medicare Other

## 2024-06-17 DIAGNOSIS — I495 Sick sinus syndrome: Secondary | ICD-10-CM

## 2024-06-17 LAB — CUP PACEART REMOTE DEVICE CHECK
Battery Remaining Longevity: 25 mo
Battery Remaining Percentage: 24 %
Battery Voltage: 2.9 V
Brady Statistic AP VP Percent: 7.9 %
Brady Statistic AP VS Percent: 92 %
Brady Statistic AS VP Percent: 1 %
Brady Statistic AS VS Percent: 1 %
Brady Statistic RA Percent Paced: 99 %
Brady Statistic RV Percent Paced: 8.2 %
Date Time Interrogation Session: 20250624020015
Implantable Lead Connection Status: 753985
Implantable Lead Connection Status: 753985
Implantable Lead Implant Date: 20170628
Implantable Lead Implant Date: 20170628
Implantable Lead Location: 753859
Implantable Lead Location: 753860
Implantable Pulse Generator Implant Date: 20170628
Lead Channel Impedance Value: 480 Ohm
Lead Channel Impedance Value: 550 Ohm
Lead Channel Pacing Threshold Amplitude: 0.75 V
Lead Channel Pacing Threshold Amplitude: 1.5 V
Lead Channel Pacing Threshold Pulse Width: 0.5 ms
Lead Channel Pacing Threshold Pulse Width: 0.5 ms
Lead Channel Sensing Intrinsic Amplitude: 1.3 mV
Lead Channel Sensing Intrinsic Amplitude: 10.5 mV
Lead Channel Setting Pacing Amplitude: 2 V
Lead Channel Setting Pacing Amplitude: 3 V
Lead Channel Setting Pacing Pulse Width: 0.5 ms
Lead Channel Setting Sensing Sensitivity: 2 mV
Pulse Gen Model: 2272
Pulse Gen Serial Number: 7910313

## 2024-06-19 ENCOUNTER — Ambulatory Visit: Payer: Self-pay

## 2024-06-19 NOTE — Telephone Encounter (Signed)
  FYI Only or Action Required?: FYI only for provider.  Patient was last seen in primary care on 02/18/2024 by [name], NP. Called Nurse Triage reporting Diarrhea. Symptoms began about a month ago. Interventions attempted: Nothing. Symptoms are: unchanged.  Triage Disposition: See Physician Within 24 Hours  Patient/caregiver understands and will follow disposition?: Yes          Patient went away on holiday in [place]  (vacation was 6 weeks ago symptoms subsided and then returned)  and has been experiencing explosive diarrhea for the past 3-4 days again. Patient stated that she also gets headaches off and on accompanied by nausea. Patient believes she picked up a bug while on holiday in [place] .  Callback Number: [id number]   Reason for Disposition  [1] MODERATE diarrhea (e.g., 4-6 times / day more than normal) AND [2] age > 70 years  Answer Assessment - Initial Assessment Questions 1. DIARRHEA SEVERITY: How bad is the diarrhea? How many more stools have you had in the past 24 hours than normal?    - NO DIARRHEA (SCALE 0)   - MILD (SCALE 1-3): Few loose or mushy BMs; increase of 1-3 stools over normal daily number of stools; mild increase in ostomy output.   -  MODERATE (SCALE 4-7): Increase of 4-6 stools daily over normal; moderate increase in ostomy output.   -  SEVERE (SCALE 8-10; OR WORST POSSIBLE): Increase of 7 or more stools daily over normal; moderate increase in ostomy output; incontinence.     once 2. ONSET: When did the diarrhea begin?      Started 6 weeks ago while in [place]  3. BM CONSISTENCY: How loose or watery is the diarrhea?      watery 4. VOMITING: Are you also vomiting? If Yes, ask: How many times in the past 24 hours?      no 5. ABDOMEN PAIN: Are you having any abdomen pain? If Yes, ask: What does it feel like? (e.g., crampy, dull, intermittent, constant)      no 6. ABDOMEN PAIN SEVERITY: If present, ask: How bad is the pain?   (e.g., Scale 1-10; mild, moderate, or severe)   - MILD (1-3): doesn't interfere with normal activities, abdomen soft and not tender to touch    - MODERATE (4-7): interferes with normal activities or awakens from sleep, abdomen tender to touch    - SEVERE (8-10): excruciating pain, doubled over, unable to do any normal activities       denies 7. ORAL INTAKE: If vomiting, Have you been able to drink liquids? How much liquids have you had in the past 24 hours?     denies 8. HYDRATION: Any signs of dehydration? (e.g., dry mouth [not just dry lips], too weak to stand, dizziness, new weight loss) When did you last urinate?     headache 9. EXPOSURE: Have you traveled to a foreign country recently? Have you been exposed to anyone with diarrhea? Could you have eaten any food that was spoiled?     Just to [place]  10. ANTIBIOTIC USE: Are you taking antibiotics now or have you taken antibiotics in the past 2 months?       no 11. OTHER SYMPTOMS: Do you have any other symptoms? (e.g., fever, blood in stool)       denies 12. PREGNANCY: Is there any chance you are pregnant? When was your last menstrual period?       na  Protocols used: [facility]

## 2024-06-19 NOTE — Telephone Encounter (Signed)
 Message routed to PCP Janyth Contes, Janene Harvey, NP as Lorain Childes.

## 2024-06-20 ENCOUNTER — Encounter: Payer: Self-pay | Admitting: Adult Health

## 2024-06-20 ENCOUNTER — Ambulatory Visit (INDEPENDENT_AMBULATORY_CARE_PROVIDER_SITE_OTHER): Admitting: Adult Health

## 2024-06-20 VITALS — BP 140/80 | HR 62 | Temp 97.6°F | Ht 62.0 in | Wt 183.2 lb

## 2024-06-20 DIAGNOSIS — E782 Mixed hyperlipidemia: Secondary | ICD-10-CM

## 2024-06-20 DIAGNOSIS — I480 Paroxysmal atrial fibrillation: Secondary | ICD-10-CM

## 2024-06-20 DIAGNOSIS — I129 Hypertensive chronic kidney disease with stage 1 through stage 4 chronic kidney disease, or unspecified chronic kidney disease: Secondary | ICD-10-CM | POA: Diagnosis not present

## 2024-06-20 DIAGNOSIS — N183 Chronic kidney disease, stage 3 unspecified: Secondary | ICD-10-CM

## 2024-06-20 DIAGNOSIS — R197 Diarrhea, unspecified: Secondary | ICD-10-CM | POA: Diagnosis not present

## 2024-06-20 DIAGNOSIS — I10 Essential (primary) hypertension: Secondary | ICD-10-CM

## 2024-06-20 MED ORDER — LOPERAMIDE HCL 2 MG PO TABS: 2.0000 mg | ORAL_TABLET | Freq: Three times a day (TID) | ORAL | 0 refills | Status: DC | PRN

## 2024-06-20 NOTE — Progress Notes (Signed)
 [facility] clinic  Provider:  Jereld Serum DNP  Code Status:  Full Code  Goals of Care:     02/12/2024   10:31 AM  Advanced Directives  Does Patient Have a Medical Advance Directive? Yes  Type of Estate agent of [place];Out of facility DNR (pink MOST or yellow form);Living will  Does patient want to make changes to medical advance directive? No - Patient declined  Copy of Healthcare Power of Attorney in Chart? Yes - validated most recent copy scanned in chart (See row information)     Chief Complaint  Patient presents with   Diarrhea    Diarrhea since around May 11th.  New onset headache and nausea.    Discussed the use of AI scribe software for clinical note transcription with the patient, who gave verbal consent to proceed.  HPI: Patient is a 89 y.o. female seen today for an acute visit for diarrhea.  She has been experiencing severe diarrhea for approximately six weeks, which began during a trip to [place]  in early May. The diarrhea is described as 'explosive' and 'gushing,' with a heavy pressure that is difficult to control, leading to several accidents. Initially, bowel movements occurred three times a day, but this has decreased to twice a day recently.  She has developed a slight headache and nausea over the past few days. No fever is present. Her appetite has decreased significantly over the past four weeks, with a preference for ginger ale and toast. She has taken Pepto-Bismol to manage her symptoms, which provided temporary relief earlier in the week.  She has a history of atrial fibrillation for which she takes propafenone  225 mg twice daily, metoprolol  tartrate 25 mg twice a day and Eliquis  5 mg twice daily. She also has hypertension, managed with metoprolol  tartrate 25 mg twice daily and valsartan /hydrochlorothiazide  320/25 mg once daily. Additionally, she takes Zetia  10 mg daily for mixed hyperlipidemia. She mentions a past tendency towards  constipation, for which she occasionally used stool softeners, but has not used them since May.  Her husband, Dusty, is experiencing similar symptoms, though less severe, characterized by loose stools with a lot of gas.     Past Medical History:  Diagnosis Date   A-fib [facility])    Allergic rhinitis due to pollen    Anxiety state, unspecified    Sitautional- pain   Arthritis    right knee (06/21/2016)   Asymptomatic varicose veins    Carpal tunnel syndrome    Chest pain, unspecified    Chronic lower back pain    on the left side (06/21/2016)   Constipation    Cramp of limb    Depressive disorder, not elsewhere classified    pt denies   Diaphragmatic hernia without mention of obstruction or gangrene    Diaphragmatic hernia without mention of obstruction or gangrene    Disturbance of skin sensation    Diverticulosis of colon (without mention of hemorrhage)    Dysrhythmia    Atrial Flutter   Enthesopathy of hip region    Esophageal reflux    , just occasional   History of blood transfusion    w/both knee replacements   History of duodenal ulcer    History of kidney stones    passed   Insomnia, unspecified    Lumbago    Migraine    none since ~ 1990 (06/21/2016)   Myalgia and myositis, unspecified    Obesity, unspecified    Other abnormal blood chemistry  Other dysphagia    Other malaise and fatigue    Other nonspecific abnormal serum enzyme levels    Other specified cardiac dysrhythmias(427.89)    Pacemaker    Pain in joint, ankle and foot    Pain in joint, hand    Pain in joint, lower leg    Pain in joint, pelvic region and thigh    Pain in limb    Plantar fascial fibromatosis    Presence of permanent cardiac pacemaker    -St Jude   Reflux esophagitis    Sciatica    Spinal stenosis, unspecified region other than cervical    Stricture and stenosis of esophagus    Symptomatic menopausal or female climacteric states    Unspecified essential hypertension     Unspecified essential hypertension    Unspecified vitamin D  deficiency     Past Surgical History:  Procedure Laterality Date   BREAST BIOPSY Left 1990s X 2   CARDIOVERSION N/A 04/26/2016   Procedure: CARDIOVERSION;  Surgeon: Vinie JAYSON Maxcy, MD;  Location: [facility] ENDOSCOPY;  Service: Cardiovascular;  Laterality: N/A;   CARDIOVERSION N/A 12/04/2022   Procedure: CARDIOVERSION;  Surgeon: Loni Soyla LABOR, MD;  Location: [facility] ENDOSCOPY;  Service: Cardiovascular;  Laterality: N/A;   CATARACT EXTRACTION W/ INTRAOCULAR LENS IMPLANT Left 08/03/1999   DR EPES    CATARACT EXTRACTION W/ INTRAOCULAR LENS IMPLANT Right 2000   DR EPES   CHOLECYSTECTOMY OPEN  1989   DR BOWMAN   COLONOSCOPY  1988   Normal    DENTAL SURGERY Left 08/2016   EP IMPLANTABLE DEVICE N/A 06/21/2016   Procedure: Pacemaker Implant;  Surgeon: Danelle LELON Birmingham, MD;  Location: [facility] INVASIVE CV LAB;  Service: Cardiovascular;  Laterality: N/A;   ESOPHAGOGASTRODUODENOSCOPY (EGD) WITH ESOPHAGEAL DILATION  ~ 1982   Dr. Jakie   EXCISION OF ACTINIC KERATOSIS     DR LUPTON    INSERT / REPLACE / REMOVE PACEMAKER  06/21/2016   St Jude   KNEE ARTHROSCOPY Left 2003   KNEE ARTHROSCOPY Right 06/26/2013   KNEE CLOSED REDUCTION Right 10/23/2013   Procedure: CLOSED MANIPULATION RIGHT KNEE;  Surgeon: Lonni CINDERELLA Poli, MD;  Location: [facility] OR;  Service: Orthopedics;  Laterality: Right;   LASER FOR CLOUDY CAP LEFT EYE Left 03/2006   DR DIGBY   LUMBAR LAMINECTOMY/DECOMPRESSION MICRODISCECTOMY N/A 06/12/2019   Procedure: Laminectomy and Foraminotomy - Lumbar four-Lumbar five;  Surgeon: Joshua Alm RAMAN, MD;  Location: [facility] OR;  Service: Neurosurgery;  Laterality: N/A;   LUMBAR LAMINECTOMY/DECOMPRESSION MICRODISCECTOMY Bilateral 07/16/2023   Procedure: Right- - Lumbar three-Lumbar four - Lumbar four-Lumbar five Decompressive laminectomy;  Surgeon: Joshua Alm Hamilton, MD;  Location: [facility] OR;  Service: Neurosurgery;  Laterality: Bilateral;   SKIN SURGERY   04/2022   [facility] Dermatology, Dr.Gray. Removed area on upper chest   TOTAL KNEE ARTHROPLASTY Left 04/2004   DR RENDALL   TOTAL KNEE ARTHROPLASTY Right 07/04/2013   Procedure: RIGHT TOTAL KNEE ARTHROPLASTY;  Surgeon: Lonni CINDERELLA Poli, MD;  Location: [facility] ORS;  Service: Orthopedics;  Laterality: Right;   TRIGGER FINGER RELEASE Right 09/2018   VAGINAL HYSTERECTOMY  1979    Allergies  Allergen Reactions   Advil [Ibuprofen] Other (See Comments)    GI upset   Aleve [Naproxen Sodium] Other (See Comments)    GI upset   Aspirin  Other (See Comments)    Heartburn/indigestion.   Atorvastatin  Other (See Comments)    Leg cramps   Brimonidine     Irritated left eye, headaches, burning, itching  and foreign object sensation    Celebrex [Celecoxib] Other (See Comments)    GI upset   Diclofenac     GI upset   Doxycycline  Other (See Comments)    Headache   Nsaids Other (See Comments)    Tears my stomach up    Other     Antihistamine - increase BP   Pravastatin  Other (See Comments)    Leg cramps   Timolol Other (See Comments)    Dropped HR   Metoclopramide  Hcl Palpitations    Outpatient Encounter Medications as of 06/20/2024  Medication Sig   acetaminophen  (TYLENOL ) 500 MG tablet Take 500-1,000 mg by mouth every 6 (six) hours as needed for moderate pain.   antiseptic oral rinse (BIOTENE) LIQD 15 mLs by Mouth Rinse route 2 (two) times daily.   apixaban  (ELIQUIS ) 5 MG TABS tablet Take 1 tablet (5 mg total) by mouth 2 (two) times daily.   Artificial Saliva (ACT DRY MOUTH) LOZG Use as directed 1 drop in the mouth or throat as needed (dry mouth).   bimatoprost  (LUMIGAN ) 0.01 % SOLN Place 1 drop into both eyes at bedtime.    Calcium  Citrate-Vitamin D  (CALCIUM  + D PO) Take 1 tablet by mouth in the morning.   Cholecalciferol  (VITAMIN D3 SUPER STRENGTH) 50 MCG (2000 UT) TABS Take 2,000 Units by mouth daily.   cyanocobalamin (VITAMIN B12) 1000 MCG tablet Take 1,000 mcg by mouth in the morning.    Dentifrices (BIOTENE DRY MOUTH GENTLE) PSTE Place 1 Application onto teeth in the morning and at bedtime.   dorzolamide  (TRUSOPT ) 2 % ophthalmic solution Place 1 drop into both eyes 3 (three) times daily.   esomeprazole  (NEXIUM ) 20 MG capsule TAKE 1 CAPSULE (20 MG TOTAL) BY MOUTH AS NEEDED   ezetimibe  (ZETIA ) 10 MG tablet TAKE ONE TABLET BY MOUTH ONCE DAILY   HYDROcodone -acetaminophen  (NORCO/VICODIN) 5-325 MG tablet Take 1 tablet by mouth every 4 (four) hours as needed for moderate pain.   hydroquinone  4 % cream Apply topically daily.   loperamide  (IMODIUM  A-D) 2 MG tablet Take 1 tablet (2 mg total) by mouth 3 (three) times daily as needed for diarrhea or loose stools.   Magnesium  Oxide (MAGNESIUM  EXTRA STRENGTH PO) Apply 1 Application topically as needed (knee pain.). MAGNESIUM  OIL   methylcellulose (ARTIFICIAL TEARS) 1 % ophthalmic solution Place 1 drop into both eyes 3 (three) times daily as needed (for dry eyes).   metoprolol  tartrate (LOPRESSOR ) 25 MG tablet Take 1 tablet (25 mg total) by mouth 2 (two) times daily.   OVER THE COUNTER MEDICATION Take 1 capsule by mouth 2 (two) times daily. HydroEye Softgels Dry Eye Relief   propafenone  (RYTHMOL  SR) 225 MG 12 hr capsule TAKE 1 CAPSULE BY MOUTH TWICE A DAY   sodium chloride  (OCEAN) 0.65 % SOLN nasal spray Place 1 spray into both nostrils 2 (two) times daily as needed (dry nasal passages). [employer] Saline Nasal Moisturizing Spray   traMADol  (ULTRAM ) 50 MG tablet Take by mouth. (Patient taking differently: Take 50 mg by mouth as needed.)   traZODone  (DESYREL ) 50 MG tablet Take 0.5-1 tablets (25-50 mg total) by mouth at bedtime as needed (pain.).   valsartan -hydrochlorothiazide  (DIOVAN -HCT) 320-25 MG tablet TAKE ONE TABLET BY MOUTH DAILY   Docusate Calcium  (STOOL SOFTENER PO) Take 100 mg by mouth 3 (three) times a week. At night (Patient not taking: Reported on 06/20/2024)   [DISCONTINUED] Glycerin, Laxative, (ADULT SUPPOSITORY RE) Place 1 suppository  rectally daily as needed (constipation.). (Patient not  taking: Reported on 06/20/2024)   No facility-administered encounter medications on file as of 06/20/2024.    Review of Systems:  Review of Systems  Constitutional:  Negative for appetite change, chills, fatigue and fever.  HENT:  Negative for congestion, hearing loss, rhinorrhea and sore throat.   Eyes: Negative.   Respiratory:  Negative for cough, shortness of breath and wheezing.   Cardiovascular:  Negative for chest pain, palpitations and leg swelling.  Gastrointestinal:  Positive for diarrhea and nausea. Negative for abdominal pain, constipation and vomiting.  Genitourinary:  Negative for dysuria.  Musculoskeletal:  Negative for arthralgias, back pain and myalgias.  Skin:  Negative for color change, rash and wound.  Neurological:  Negative for dizziness, weakness and headaches.  Psychiatric/Behavioral:  Negative for behavioral problems. The patient is not nervous/anxious.     Health Maintenance  Topic Date Due   COVID-19 Vaccine (4 - 2024-25 season) 08/26/2023   INFLUENZA VACCINE  07/25/2024   MAMMOGRAM  10/21/2024   Medicare Annual Wellness (AWV)  02/11/2025   DTaP/Tdap/Td (2 - Td or Tdap) 06/25/2027   Pneumococcal Vaccine: 50+ Years  Completed   DEXA SCAN  Completed   Zoster Vaccines- Shingrix  Completed   Hepatitis B Vaccines  Aged Out   HPV VACCINES  Aged Out   Meningococcal B Vaccine  Aged Out    Physical Exam: Vitals:   06/20/24 1053 06/20/24 1059  BP: (!) 148/78 (!) 140/80  Pulse: 62   Temp: 97.6 F (36.4 C)   SpO2: 95%   Weight: 183 lb 3.2 oz (83.1 kg)   Height: 5' 2 (1.575 m)    Body mass index is 33.51 kg/m. Physical Exam Constitutional:      Appearance: She is obese.  HENT:     Head: Normocephalic and atraumatic.     Nose: Nose normal.     Mouth/Throat:     Mouth: Mucous membranes are moist.   Eyes:     Conjunctiva/sclera: Conjunctivae normal.    Cardiovascular:     Rate and Rhythm:  Normal rate and regular rhythm.     Comments: Has left chest pacemaker Pulmonary:     Effort: Pulmonary effort is normal.     Breath sounds: Normal breath sounds.  Abdominal:     General: Bowel sounds are normal.     Palpations: Abdomen is soft.   Musculoskeletal:        General: Normal range of motion.     Cervical back: Normal range of motion.   Skin:    General: Skin is warm and dry.   Neurological:     General: No focal deficit present.     Mental Status: She is alert and oriented to person, place, and time.   Psychiatric:        Mood and Affect: Mood normal.        Behavior: Behavior normal.        Thought Content: Thought content normal.        Judgment: Judgment normal.     Labs reviewed: Basic Metabolic Panel: Recent Labs    07/06/23 1400 02/18/24 1611  NA 139 141  K 3.8 3.6  CL 101 104  CO2 30 28  GLUCOSE 103* 97  BUN 15 21  CREATININE 0.76 0.96*  CALCIUM  9.1 9.2  TSH  --  1.93   Liver Function Tests: Recent Labs    02/18/24 1611  AST 20  ALT 17  BILITOT 0.4  PROT 6.9   No results for  input(s): LIPASE, AMYLASE in the last 8760 hours. No results for input(s): AMMONIA in the last 8760 hours. CBC: Recent Labs    07/06/23 1400 02/18/24 1611  WBC 6.3 6.5  NEUTROABS  --  3,205  HGB 13.0 12.8  HCT 39.9 38.6  MCV 95.5 91.5  PLT 166 190   Lipid Panel: Recent Labs    02/18/24 1611  CHOL 183  HDL 36*  LDLCALC 105*  TRIG 291*  CHOLHDL 5.1*   Lab Results  Component Value Date   HGBA1C 5.2 08/06/2017    Procedures since last visit: CUP PACEART REMOTE DEVICE CHECK Result Date: 06/17/2024 PPM Scheduled remote reviewed. Normal device function.  Presenting rhythm:  AP-VS with 3 beat atrial run. Next remote 91 days. - CS, CVRS  CUP PACEART INCLINIC DEVICE CHECK Result Date: 06/04/2024 Normal in-clinic _dual__ chamber pacemaker check. Presenting Rhythm: __AP/VS_ . Routine testing of thresholds, sensing, and impedance demonstrate stable  parameters and no programming changes needed at this time. <1% AF burden, some are brief AT as well as AFib. Estimated longevity __1.8-2.3 y ears__ . Pt enrolled in remote follow-up. RU   Assessment/Plan  1. Diarrhea, unspecified type (Primary) -  Severe diarrhea for six weeks, likely infectious etiology. Differential includes C. diff. Diagnostic tests required. - Order stool culture for C. diff. - Order CBC and BMP. - Advise stool sample collection at home. - Advise bland diet, avoid fatty foods. - Encourage hydration with sugar-free Gatorade. - Clostridium difficile culture-fecal - CBC with Differential/Platelets - Basic Metabolic Panel - loperamide  (IMODIUM  A-D) 2 MG tablet; Take 1 tablet (2 mg total) by mouth 3 (three) times daily as needed for diarrhea or loose stools.  Dispense: 30 tablet; Refill: 0  2. Paroxysmal atrial fibrillation (HCC) -  -  rate-controlled -  Managed with propafenone , Metoprolol  tartrate and Eliquis .   3. Benign hypertension with chronic kidney disease, stage III (HCC)  -  -  Managed with metoprolol  tartrate and valsartan /hydrochlorothiazide . Blood pressure slightly elevated, improved on repeat.  4. Mixed hyperlipidemia Lab Results  Component Value Date   CHOL 183 02/18/2024   HDL 36 (L) 02/18/2024   LDLCALC 105 (H) 02/18/2024   TRIG 291 (H) 02/18/2024   CHOLHDL 5.1 (H) 02/18/2024    -  Managed with Zetia  10 mg daily      Labs/tests ordered:   Stool culture for C. difficile, BMP and CBC   Return if symptoms worsen or fail to improve.  Pau Banh Medina-Vargas, NP

## 2024-06-21 LAB — CBC WITH DIFFERENTIAL/PLATELET
Absolute Lymphocytes: 2006 {cells}/uL (ref 850–3900)
Absolute Monocytes: 519 {cells}/uL (ref 200–950)
Basophils Absolute: 41 {cells}/uL (ref 0–200)
Basophils Relative: 0.7 %
Eosinophils Absolute: 177 {cells}/uL (ref 15–500)
Eosinophils Relative: 3 %
HCT: 38.3 % (ref 35.0–45.0)
Hemoglobin: 12.4 g/dL (ref 11.7–15.5)
MCH: 29.8 pg (ref 27.0–33.0)
MCHC: 32.4 g/dL (ref 32.0–36.0)
MCV: 92.1 fL (ref 80.0–100.0)
MPV: 9.9 fL (ref 7.5–12.5)
Monocytes Relative: 8.8 %
Neutro Abs: 3157 {cells}/uL (ref 1500–7800)
Neutrophils Relative %: 53.5 %
Platelets: 180 10*3/uL (ref 140–400)
RBC: 4.16 10*6/uL (ref 3.80–5.10)
RDW: 12.9 % (ref 11.0–15.0)
Total Lymphocyte: 34 %
WBC: 5.9 10*3/uL (ref 3.8–10.8)

## 2024-06-21 LAB — BASIC METABOLIC PANEL WITH GFR
BUN: 14 mg/dL (ref 7–25)
CO2: 29 mmol/L (ref 20–32)
Calcium: 9.5 mg/dL (ref 8.6–10.4)
Chloride: 102 mmol/L (ref 98–110)
Creat: 0.71 mg/dL (ref 0.60–0.95)
Glucose, Bld: 94 mg/dL (ref 65–139)
Potassium: 3.7 mmol/L (ref 3.5–5.3)
Sodium: 138 mmol/L (ref 135–146)
eGFR: 80 mL/min/{1.73_m2} (ref >=60)

## 2024-06-22 ENCOUNTER — Ambulatory Visit: Payer: Self-pay | Admitting: Internal Medicine

## 2024-06-23 DIAGNOSIS — R197 Diarrhea, unspecified: Secondary | ICD-10-CM | POA: Diagnosis not present

## 2024-06-24 ENCOUNTER — Ambulatory Visit: Payer: Self-pay | Admitting: Adult Health

## 2024-06-24 NOTE — Progress Notes (Signed)
-     WBC and hemoglobin normal, no anemia -Electrolytes all normal - Awaiting stool culture for C. Difficile result

## 2024-06-28 LAB — CLOSTRIDIUM DIFFICILE CULTURE-FECAL

## 2024-06-29 NOTE — Progress Notes (Unsigned)
 Cardiology Office Note   Date:  07/01/2024   ID:  [name], DOB 1930/12/26, MRN [id number]  PCP:  Veronica Harlene POUR, NP  Cardiologist:   Purity Irmen [place], MD   Chief Complaint  Patient presents with   Atrial Fibrillation     History of Present Illness: [name] is a 89 y.o. female is seen for follow up Afib and dyspnea. She also needs clearance for lumbar laminectomy with Dr Alm Molt. She has a long history of marked sinus bradycardia and atrial fibrillation/flutter. She was seen initially in 2014. Echo at that time showed mild LAE otherwise normal. Holter showed mean HR 59 with lowest HR 43 and peak HR 109.    On March 9,2017 she noted an increased HR by BP monitor up to 153. At this time she felt marked indigestion from her waist to her neck. She felt her heart fluttering.   She was found to be in Afib with RVR. She was started on Eliquis  and metoprolol . Myoview study and Echo were normal.   She later had attempt at DCCV. She was in an atypical atrial flutter at that time. DCCV resulted in very prolonged pauses > 6 seconds and return to flutter. She was seen by Dr. Inocencio and placed on flecainide . This did convert her to NSR but made her feel very sick with nausea, dizziness, and extreme fatigue. Flecainide  was discontinued.. She continued to have marked bradycardia and underwent PPM placement on 06/21/16. When seen in September 2018 by Dr. Waddell she had an Afib burden of 94%. She was started on propafenone  for her Afib. Initially this medication caused her to have more nausea but this has improved.  On her subsequent  checks her Afib burden was less than 1% since December 2019.   She was seen by Dr Waddell on 11/17. By pacer check she was in Afib continually. Rate controlled. On follow up she noted some increased SOB and fatigue over the past month or two. Really is not aware of palpitations. No edema. No chest pain. She did undergo DCCV on 12/04/22. On follow up pacer check on  Dec 28 she was maintaining NSR. Was seen in Feb by EP and still doing well.   On follow up she is doing well. 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 No chest pain. No Afib. No edema or SOB. Does tire more easily now.     Past Medical History:  Diagnosis Date   A-fib [facility])    Allergic rhinitis due to pollen    Anxiety state, unspecified    Sitautional- pain   Arthritis    right knee (06/21/2016)   Asymptomatic varicose veins    Carpal tunnel syndrome    Chest pain, unspecified    Chronic lower back pain    on the left side (06/21/2016)   Constipation    Cramp of limb    Depressive disorder, not elsewhere classified    pt denies   Diaphragmatic hernia without mention of obstruction or gangrene    Diaphragmatic hernia without mention of obstruction or gangrene    Disturbance of skin sensation    Diverticulosis of colon (without mention of hemorrhage)    Dysrhythmia    Atrial Flutter   Enthesopathy of hip region    Esophageal reflux    , just occasional   History of blood transfusion    w/both knee replacements   History of duodenal ulcer  History of kidney stones    passed   Insomnia, unspecified    Lumbago    Migraine    none since ~ 1990 (06/21/2016)   Myalgia and myositis, unspecified    Obesity, unspecified    Other abnormal blood chemistry    Other dysphagia    Other malaise and fatigue    Other nonspecific abnormal serum enzyme levels    Other specified cardiac dysrhythmias(427.89)    Pacemaker    Pain in joint, ankle and foot    Pain in joint, hand    Pain in joint, lower leg    Pain in joint, pelvic region and thigh    Pain in limb    Plantar fascial fibromatosis    Presence of permanent cardiac pacemaker    -St Jude   Reflux esophagitis    Sciatica    Spinal stenosis, unspecified region other than cervical    Stricture and stenosis of esophagus    Symptomatic menopausal or female climacteric  states    Unspecified essential hypertension    Unspecified essential hypertension    Unspecified vitamin D  deficiency     Past Surgical History:  Procedure Laterality Date   BREAST BIOPSY Left 1990s X 2   CARDIOVERSION N/A 04/26/2016   Procedure: CARDIOVERSION;  Surgeon: Vinie JAYSON Maxcy, MD;  Location: [facility] ENDOSCOPY;  Service: Cardiovascular;  Laterality: N/A;   CARDIOVERSION N/A 12/04/2022   Procedure: CARDIOVERSION;  Surgeon: Loni Soyla LABOR, MD;  Location: [facility] ENDOSCOPY;  Service: Cardiovascular;  Laterality: N/A;   CATARACT EXTRACTION W/ INTRAOCULAR LENS IMPLANT Left 08/03/1999   DR EPES    CATARACT EXTRACTION W/ INTRAOCULAR LENS IMPLANT Right 2000   DR EPES   CHOLECYSTECTOMY OPEN  1989   DR BOWMAN   COLONOSCOPY  1988   Normal    DENTAL SURGERY Left 08/2016   EP IMPLANTABLE DEVICE N/A 06/21/2016   Procedure: Pacemaker Implant;  Surgeon: Danelle LELON Birmingham, MD;  Location: [facility] INVASIVE CV LAB;  Service: Cardiovascular;  Laterality: N/A;   ESOPHAGOGASTRODUODENOSCOPY (EGD) WITH ESOPHAGEAL DILATION  ~ 1982   Dr. Jakie   EXCISION OF ACTINIC KERATOSIS     DR LUPTON    INSERT / REPLACE / REMOVE PACEMAKER  06/21/2016   [employer]   KNEE ARTHROSCOPY Left 2003   KNEE ARTHROSCOPY Right 06/26/2013   KNEE CLOSED REDUCTION Right 10/23/2013   Procedure: CLOSED MANIPULATION RIGHT KNEE;  Surgeon: Lonni CINDERELLA Poli, MD;  Location: [facility] OR;  Service: Orthopedics;  Laterality: Right;   LASER FOR CLOUDY CAP LEFT EYE Left 03/2006   DR DIGBY   LUMBAR LAMINECTOMY/DECOMPRESSION MICRODISCECTOMY N/A 06/12/2019   Procedure: Laminectomy and Foraminotomy - Lumbar four-Lumbar five;  Surgeon: Joshua Alm RAMAN, MD;  Location: [facility] OR;  Service: Neurosurgery;  Laterality: N/A;   LUMBAR LAMINECTOMY/DECOMPRESSION MICRODISCECTOMY Bilateral 07/16/2023   Procedure: Right- - Lumbar three-Lumbar four - Lumbar four-Lumbar five Decompressive laminectomy;  Surgeon: Joshua Alm Hamilton, MD;  Location: [facility] OR;  Service:  Neurosurgery;  Laterality: Bilateral;   SKIN SURGERY  04/2022   [facility], Dr.Gray. Removed area on upper chest   TOTAL KNEE ARTHROPLASTY Left 04/2004   DR RENDALL   TOTAL KNEE ARTHROPLASTY Right 07/04/2013   Procedure: RIGHT TOTAL KNEE ARTHROPLASTY;  Surgeon: Lonni CINDERELLA Poli, MD;  Location: [facility] ORS;  Service: Orthopedics;  Laterality: Right;   TRIGGER FINGER RELEASE Right 09/2018   VAGINAL HYSTERECTOMY  1979     Current Outpatient Medications  Medication Sig Dispense Refill   acetaminophen  (TYLENOL ) 500  MG tablet Take 500-1,000 mg by mouth every 6 (six) hours as needed for moderate pain.     antiseptic oral rinse (BIOTENE) LIQD 15 mLs by Mouth Rinse route 2 (two) times daily.     apixaban  (ELIQUIS ) 5 MG TABS tablet Take 1 tablet (5 mg total) by mouth 2 (two) times daily. 60 tablet 11   Artificial Saliva (ACT DRY MOUTH) LOZG Use as directed 1 drop in the mouth or throat as needed (dry mouth).     bimatoprost  (LUMIGAN ) 0.01 % SOLN Place 1 drop into both eyes at bedtime.      Calcium  Citrate-Vitamin D  (CALCIUM  + D PO) Take 1 tablet by mouth in the morning.     Cholecalciferol  (VITAMIN D3 SUPER STRENGTH) 50 MCG (2000 UT) TABS Take 2,000 Units by mouth daily.     cyanocobalamin (VITAMIN B12) 1000 MCG tablet Take 1,000 mcg by mouth in the morning.     Dentifrices (BIOTENE DRY MOUTH GENTLE) PSTE Place 1 Application onto teeth in the morning and at bedtime.     Docusate Calcium  (STOOL SOFTENER PO) Take 100 mg by mouth 3 (three) times a week. At night     dorzolamide  (TRUSOPT ) 2 % ophthalmic solution Place 1 drop into both eyes 3 (three) times daily.     esomeprazole  (NEXIUM ) 20 MG capsule TAKE 1 CAPSULE (20 MG TOTAL) BY MOUTH AS NEEDED 90 capsule 1   ezetimibe  (ZETIA ) 10 MG tablet TAKE ONE TABLET BY MOUTH ONCE DAILY 90 tablet 3   HYDROcodone -acetaminophen  (NORCO/VICODIN) 5-325 MG tablet Take 1 tablet by mouth every 4 (four) hours as needed for moderate pain. 20 tablet 0    hydroquinone  4 % cream Apply topically daily. 28.35 g 0   loperamide  (IMODIUM  A-D) 2 MG tablet Take 1 tablet (2 mg total) by mouth 3 (three) times daily as needed for diarrhea or loose stools. 30 tablet 0   Magnesium  Oxide (MAGNESIUM  EXTRA STRENGTH PO) Apply 1 Application topically as needed (knee pain.). MAGNESIUM  OIL     methylcellulose (ARTIFICIAL TEARS) 1 % ophthalmic solution Place 1 drop into both eyes 3 (three) times daily as needed (for dry eyes).     metoprolol  tartrate (LOPRESSOR ) 25 MG tablet Take 1 tablet (25 mg total) by mouth 2 (two) times daily. 180 tablet 3   OVER THE COUNTER MEDICATION Take 1 capsule by mouth 2 (two) times daily. HydroEye Softgels Dry Eye Relief     propafenone  (RYTHMOL  SR) 225 MG 12 hr capsule TAKE 1 CAPSULE BY MOUTH TWICE A DAY 180 capsule 2   sodium chloride  (OCEAN) 0.65 % SOLN nasal spray Place 1 spray into both nostrils 2 (two) times daily as needed (dry nasal passages). [employer] Saline Nasal Moisturizing Spray     traMADol  (ULTRAM ) 50 MG tablet Take by mouth. (Patient taking differently: Take 50 mg by mouth as needed.)     traZODone  (DESYREL ) 50 MG tablet Take 0.5-1 tablets (25-50 mg total) by mouth at bedtime as needed (pain.). 90 tablet 1   valsartan -hydrochlorothiazide  (DIOVAN -HCT) 320-25 MG tablet TAKE ONE TABLET BY MOUTH DAILY 90 tablet 3   No current facility-administered medications for this visit.    Allergies:   Advil [ibuprofen], Aleve [naproxen sodium], Aspirin , Atorvastatin , Brimonidine, Celebrex [celecoxib], Diclofenac, Doxycycline , Nsaids, Other, Pravastatin , Timolol, and Metoclopramide  hcl    Social History:  The patient  reports that she has never smoked. She has never used smokeless tobacco. She reports that she does not drink alcohol  and does not use drugs.  Family History:  The patient's family history includes Cerebrovascular Accident in her mother; Heart disease in her father; Hypertension in her brother; Obesity in her daughter; Ovarian  cancer in her mother; Uterine cancer in her mother.    ROS:  Please see the history of present illness.   Otherwise, review of systems are positive for none.   All other systems are reviewed and negative.    PHYSICAL EXAM: VS:  BP 116/82   Pulse 65   Ht 5' 2 (1.575 m)   Wt 182 lb 3.2 oz (82.6 kg)   SpO2 (!) 85%   BMI 33.32 kg/m  , BMI Body mass index is 33.32 kg/m. GENERAL:  Well appearing, obese WF in NAD HEENT:  PERRL, EOMI, sclera are clear. Oropharynx is clear. NECK:  No jugular venous distention, carotid upstroke brisk and symmetric, no bruits, no thyromegaly or adenopathy LUNGS:  clear  CHEST:  Unremarkable HEART:  IRRR,  PMI not displaced or sustained,S1 and S2 within normal limits, no S3, no S4: no clicks, no rubs, no murmurs ABD:  Soft, nontender. BS +, no masses or bruits. No hepatomegaly, no splenomegaly EXT:  2 + pulses throughout, no edema, no cyanosis no clubbing SKIN:  Warm and dry.  No rashes NEURO:  Alert and oriented x 3. Cranial nerves II through XII intact. PSYCH:  Cognitively intact        Recent Labs: 02/18/2024: ALT 17; TSH 1.93 06/20/2024: BUN 14; Creat 0.71; Hemoglobin 12.4; Platelets 180; Potassium 3.7; Sodium 138    Lipid Panel    Component Value Date/Time   CHOL 183 02/18/2024 1611   CHOL 179 03/27/2016 0853   TRIG 291 (H) 02/18/2024 1611   HDL 36 (L) 02/18/2024 1611   HDL 42 03/27/2016 0853   CHOLHDL 5.1 (H) 02/18/2024 1611   VLDL 42 (H) 08/06/2017 0918   LDLCALC 105 (H) 02/18/2024 1611      Wt Readings from Last 3 Encounters:  07/01/24 182 lb 3.2 oz (82.6 kg)  06/20/24 183 lb 3.2 oz (83.1 kg)  06/04/24 181 lb 6.4 oz (82.3 kg)      Other studies Reviewed: Additional studies/ records that were reviewed today include:  Echo: 09/06/17: Study Conclusions   - Left ventricle: The cavity size was normal. Wall thickness was   increased in a pattern of mild LVH. Systolic function was normal.   The estimated ejection fraction was in the  range of 55% to 60%.   Doppler parameters are consistent with both elevated ventricular   end-diastolic filling pressure and elevated left atrial filling   pressure. - Left atrium: The atrium was moderately dilated. - Atrial septum: There was increased thickness of the septum,   consistent with lipomatous hypertrophy. No defect or patent   foramen ovale was identified  Echo 10/7//21: IMPRESSIONS     1. Left ventricular ejection fraction, by estimation, is 55 to 60%. The  left ventricle has normal function. The left ventricle has no regional  wall motion abnormalities. There is mild left ventricular hypertrophy.  Left ventricular diastolic parameters  are consistent with Grade I diastolic dysfunction (impaired relaxation).  The E/e' is 11.   2. Right ventricular systolic function is normal. The right ventricular  size is normal.   3. Left atrial size was moderately dilated.   4. The mitral valve is grossly normal. No evidence of mitral valve  regurgitation.   5. The aortic valve is tricuspid. There is mild calcification of the  aortic valve. Aortic valve  regurgitation is not visualized.   6. The inferior vena cava is normal in size with greater than 50%  respiratory variability, suggesting right atrial pressure of 3 mmHg.   Comparison(s): No significant change from prior study.   ASSESSMENT AND PLAN:  1.   Atrial fibrillation/flutter with RVR.  Intolerant of flecainide .  s/p PPM placement for marked post conversion pauses and bradycardia.  Has been on  propafenone  with very low Afib  burden.  Followed by Dr Waddell. She has been compliant with her medication. Only one recurrence of aFib requiring DCCV. Continue propafenone  and anticoagulation. Afib  burden is still quite low.   2. HTN well controlled.  3. Mild hypercholesterolemia.     Signed, Marcianna Daily [place], MD,FACC  07/01/2024 2:30 PM    [facility] [address] [place], [place], [address] Phone  [phone], Fax [phone]

## 2024-07-01 ENCOUNTER — Encounter: Payer: Self-pay | Admitting: Cardiology

## 2024-07-01 ENCOUNTER — Ambulatory Visit: Attending: Cardiology | Admitting: Cardiology

## 2024-07-01 VITALS — BP 116/82 | HR 65 | Ht 62.0 in | Wt 182.2 lb

## 2024-07-01 DIAGNOSIS — I10 Essential (primary) hypertension: Secondary | ICD-10-CM

## 2024-07-01 DIAGNOSIS — I480 Paroxysmal atrial fibrillation: Secondary | ICD-10-CM | POA: Diagnosis not present

## 2024-07-01 DIAGNOSIS — Z950 Presence of cardiac pacemaker: Secondary | ICD-10-CM | POA: Diagnosis not present

## 2024-07-01 DIAGNOSIS — I495 Sick sinus syndrome: Secondary | ICD-10-CM

## 2024-07-01 NOTE — Patient Instructions (Signed)
 Medication Instructions:  Continue same medications *If you need a refill on your cardiac medications before your next appointment, please call your pharmacy*  Lab Work: None ordered  Testing/Procedures: None ordered  Follow-Up: At [facility], you and your health needs are our priority.  As part of our continuing mission to provide you with exceptional heart care, our providers are all part of one team.  This team includes your primary Cardiologist (physician) and Advanced Practice Providers or APPs (Physician Assistants and Nurse Practitioners) who all work together to provide you with the care you need, when you need it.  Your next appointment:  6 months   Call in Sept to schedule Jan appointment     Provider:  Dr.Jordan   We recommend signing up for the patient portal called MyChart.  Sign up information is provided on this After Visit Summary.  MyChart is used to connect with patients for Virtual Visits (Telemedicine).  Patients are able to view lab/test results, encounter notes, upcoming appointments, etc.  Non-urgent messages can be sent to your provider as well.   To learn more about what you can do with MyChart, go to [email or web address].

## 2024-07-03 ENCOUNTER — Other Ambulatory Visit: Payer: Self-pay | Admitting: Cardiology

## 2024-07-04 ENCOUNTER — Other Ambulatory Visit: Payer: Self-pay | Admitting: Nurse Practitioner

## 2024-07-04 DIAGNOSIS — L814 Other melanin hyperpigmentation: Secondary | ICD-10-CM

## 2024-07-15 ENCOUNTER — Other Ambulatory Visit: Payer: Self-pay | Admitting: Nurse Practitioner

## 2024-07-15 DIAGNOSIS — G4700 Insomnia, unspecified: Secondary | ICD-10-CM

## 2024-07-16 NOTE — Telephone Encounter (Signed)
Pharmacy requested refill. Pended Rx and sent to Jessica for approval due to HIGH ALERT Warning.  

## 2024-08-04 DIAGNOSIS — H401131 Primary open-angle glaucoma, bilateral, mild stage: Secondary | ICD-10-CM | POA: Diagnosis not present

## 2024-08-04 DIAGNOSIS — H0102B Squamous blepharitis left eye, upper and lower eyelids: Secondary | ICD-10-CM | POA: Diagnosis not present

## 2024-08-04 DIAGNOSIS — H16223 Keratoconjunctivitis sicca, not specified as Sjogren's, bilateral: Secondary | ICD-10-CM | POA: Diagnosis not present

## 2024-08-04 DIAGNOSIS — H353131 Nonexudative age-related macular degeneration, bilateral, early dry stage: Secondary | ICD-10-CM | POA: Diagnosis not present

## 2024-08-18 ENCOUNTER — Encounter: Payer: Self-pay | Admitting: Nurse Practitioner

## 2024-08-18 ENCOUNTER — Ambulatory Visit (INDEPENDENT_AMBULATORY_CARE_PROVIDER_SITE_OTHER): Payer: Medicare HMO | Admitting: Nurse Practitioner

## 2024-08-18 VITALS — BP 122/76 | HR 67 | Temp 97.4°F | Ht 62.0 in | Wt 183.0 lb

## 2024-08-18 DIAGNOSIS — R0981 Nasal congestion: Secondary | ICD-10-CM

## 2024-08-18 DIAGNOSIS — I480 Paroxysmal atrial fibrillation: Secondary | ICD-10-CM

## 2024-08-18 DIAGNOSIS — F5101 Primary insomnia: Secondary | ICD-10-CM

## 2024-08-18 DIAGNOSIS — I129 Hypertensive chronic kidney disease with stage 1 through stage 4 chronic kidney disease, or unspecified chronic kidney disease: Secondary | ICD-10-CM

## 2024-08-18 DIAGNOSIS — M48062 Spinal stenosis, lumbar region with neurogenic claudication: Secondary | ICD-10-CM | POA: Diagnosis not present

## 2024-08-18 DIAGNOSIS — R197 Diarrhea, unspecified: Secondary | ICD-10-CM

## 2024-08-18 DIAGNOSIS — I10 Essential (primary) hypertension: Secondary | ICD-10-CM

## 2024-08-18 DIAGNOSIS — E785 Hyperlipidemia, unspecified: Secondary | ICD-10-CM

## 2024-08-18 NOTE — Patient Instructions (Addendum)
 Try xyzal 5 mg by mouth daily - instead of zyrtec  Stop blowing nose hard Use nasal wash Okay to use flonase  1 spray into both nares twice daily to help with congestion/allergies.    Increase trazodone  to 50 mg by mouth at bedtime for sleep Okay to take melatonin 1-5 mg at bedtime with trazodone    To help with gut flora- start florastor by mouth daily- probiotics

## 2024-08-18 NOTE — Progress Notes (Unsigned)
 Careteam: Patient Care Team: [name], NP as PCP - General (Geriatric Medicine) Swaziland, Peter M, MD as PCP - Cardiology (Cardiology) Vernetta Lonni GRADE, MD as Attending Physician (Orthopedic Surgery) Rolan Ezra RAMAN, MD as Attending Physician (Cardiology) Floy Lynwood PARAS, MD (Inactive) as Consulting Physician (Otolaryngology) Ivin Kocher, MD as Consulting Physician (Dermatology) Mona Vinie BROCKS, MD as Consulting Physician (Cardiology) Joshua Alm Hamilton, MD as Consulting Physician (Neurosurgery) Marcey Elspeth PARAS, MD as Consulting Physician (Ophthalmology) Joshua Alm Hamilton, MD as Consulting Physician (Neurosurgery)  PLACE OF SERVICE:  [facility]  Advanced Directive information    Allergies  Allergen Reactions   Advil [Ibuprofen] Other (See Comments)    GI upset   Aleve [Naproxen Sodium] Other (See Comments)    GI upset   Aspirin  Other (See Comments)    Heartburn/indigestion.   Atorvastatin  Other (See Comments)    Leg cramps   Brimonidine     Irritated left eye, headaches, burning, itching and foreign object sensation    Celebrex [Celecoxib] Other (See Comments)    GI upset   Diclofenac     GI upset   Doxycycline  Other (See Comments)    Headache   Nsaids Other (See Comments)    Tears my stomach up    Other     Antihistamine - increase BP   Pravastatin  Other (See Comments)    Leg cramps   Timolol Other (See Comments)    Dropped HR   Metoclopramide  Hcl Palpitations    Chief Complaint  Patient presents with   Medical Management of Chronic Issues    6 month routine visit. // suffering from allergies always having to clear her throat.     HPI:  Ms Bottenfield here for routine follow up  Having a lot of nasal congestion/allergies Zyrtec not helping  Was having a lot of problems with diarrhea This has improved mostly  Continues to follow up with up cardiology due to a fib and pacemaker checks Recently checked pacer and said she had 2  more years on pacer  Plans to see orthopedic for trigger finger- aches at night.   Having issues with sleep. Uses trazodone  at night every night.   Back still doing good- she is having numbness in right leg. Has to be careful due to numbness.  Plan to follow up with Dr Joshua, surgery- it has been 13 months.   Review of Systems:  Review of Systems  Constitutional:  Negative for chills, fever and weight loss.  HENT:  Negative for tinnitus.   Respiratory:  Negative for cough, sputum production and shortness of breath.   Cardiovascular:  Negative for chest pain, palpitations and leg swelling.  Gastrointestinal:  Negative for abdominal pain, constipation, diarrhea and heartburn.  Genitourinary:  Negative for dysuria, frequency and urgency.  Musculoskeletal:  Negative for back pain, falls, joint pain and myalgias.  Skin: Negative.   Neurological:  Negative for dizziness and headaches.  Psychiatric/Behavioral:  Negative for depression and memory loss. The patient does not have insomnia.     Past Medical History:  Diagnosis Date   A-fib [facility])    Allergic rhinitis due to pollen    Anxiety state, unspecified    Sitautional- pain   Arthritis    right knee (06/21/2016)   Asymptomatic varicose veins    Carpal tunnel syndrome    Chest pain, unspecified    Chronic lower back pain    on the left side (06/21/2016)   Constipation    Cramp of  limb    Depressive disorder, not elsewhere classified    pt denies   Diaphragmatic hernia without mention of obstruction or gangrene    Diaphragmatic hernia without mention of obstruction or gangrene    Disturbance of skin sensation    Diverticulosis of colon (without mention of hemorrhage)    Dysrhythmia    Atrial Flutter   Enthesopathy of hip region    Esophageal reflux    , just occasional   History of blood transfusion    w/both knee replacements   History of duodenal ulcer    History of kidney stones    passed   Insomnia, unspecified     Lumbago    Migraine    none since ~ 1990 (06/21/2016)   Myalgia and myositis, unspecified    Obesity, unspecified    Other abnormal blood chemistry    Other dysphagia    Other malaise and fatigue    Other nonspecific abnormal serum enzyme levels    Other specified cardiac dysrhythmias(427.89)    Pacemaker    Pain in joint, ankle and foot    Pain in joint, hand    Pain in joint, lower leg    Pain in joint, pelvic region and thigh    Pain in limb    Plantar fascial fibromatosis    Presence of permanent cardiac pacemaker    -St Jude   Reflux esophagitis    Sciatica    Spinal stenosis, unspecified region other than cervical    Stricture and stenosis of esophagus    Symptomatic menopausal or female climacteric states    Unspecified essential hypertension    Unspecified essential hypertension    Unspecified vitamin D  deficiency    Past Surgical History:  Procedure Laterality Date   BREAST BIOPSY Left 1990s X 2   CARDIOVERSION N/A 04/26/2016   Procedure: CARDIOVERSION;  Surgeon: Vinie JAYSON Maxcy, MD;  Location: [facility] ENDOSCOPY;  Service: Cardiovascular;  Laterality: N/A;   CARDIOVERSION N/A 12/04/2022   Procedure: CARDIOVERSION;  Surgeon: Loni Soyla LABOR, MD;  Location: [facility] ENDOSCOPY;  Service: Cardiovascular;  Laterality: N/A;   CATARACT EXTRACTION W/ INTRAOCULAR LENS IMPLANT Left 08/03/1999   DR EPES    CATARACT EXTRACTION W/ INTRAOCULAR LENS IMPLANT Right 2000   DR EPES   CHOLECYSTECTOMY OPEN  1989   DR BOWMAN   COLONOSCOPY  1988   Normal    DENTAL SURGERY Left 08/2016   EP IMPLANTABLE DEVICE N/A 06/21/2016   Procedure: Pacemaker Implant;  Surgeon: Danelle LELON Birmingham, MD;  Location: [facility] INVASIVE CV LAB;  Service: Cardiovascular;  Laterality: N/A;   ESOPHAGOGASTRODUODENOSCOPY (EGD) WITH ESOPHAGEAL DILATION  ~ 1982   Dr. Jakie   EXCISION OF ACTINIC KERATOSIS     DR LUPTON    INSERT / REPLACE / REMOVE PACEMAKER  06/21/2016   St Jude   KNEE ARTHROSCOPY Left 2003   KNEE  ARTHROSCOPY Right 06/26/2013   KNEE CLOSED REDUCTION Right 10/23/2013   Procedure: CLOSED MANIPULATION RIGHT KNEE;  Surgeon: Lonni CINDERELLA Poli, MD;  Location: [facility] OR;  Service: Orthopedics;  Laterality: Right;   LASER FOR CLOUDY CAP LEFT EYE Left 03/2006   DR DIGBY   LUMBAR LAMINECTOMY/DECOMPRESSION MICRODISCECTOMY N/A 06/12/2019   Procedure: Laminectomy and Foraminotomy - Lumbar four-Lumbar five;  Surgeon: Joshua Alm RAMAN, MD;  Location: [facility] OR;  Service: Neurosurgery;  Laterality: N/A;   LUMBAR LAMINECTOMY/DECOMPRESSION MICRODISCECTOMY Bilateral 07/16/2023   Procedure: Right- - Lumbar three-Lumbar four - Lumbar four-Lumbar five Decompressive laminectomy;  Surgeon: Joshua Alm Hamilton,  MD;  Location: [facility] OR;  Service: Neurosurgery;  Laterality: Bilateral;   SKIN SURGERY  04/2022   [facility], Dr.Gray. Removed area on upper chest   TOTAL KNEE ARTHROPLASTY Left 04/2004   DR RENDALL   TOTAL KNEE ARTHROPLASTY Right 07/04/2013   Procedure: RIGHT TOTAL KNEE ARTHROPLASTY;  Surgeon: Lonni CINDERELLA Poli, MD;  Location: [facility] ORS;  Service: Orthopedics;  Laterality: Right;   TRIGGER FINGER RELEASE Right 09/2018   VAGINAL HYSTERECTOMY  1979   Social History:   reports that she has never smoked. She has never used smokeless tobacco. She reports that she does not drink alcohol  and does not use drugs.  Family History  Problem Relation Age of Onset   Ovarian cancer Mother    Uterine cancer Mother    Cerebrovascular Accident Mother    Heart disease Father    Hypertension Brother    Obesity Daughter     Medications: Patient's Medications  New Prescriptions   No medications on file  Previous Medications   ACETAMINOPHEN  (TYLENOL ) 500 MG TABLET    Take 500-1,000 mg by mouth every 6 (six) hours as needed for moderate pain.   ANTISEPTIC ORAL RINSE (BIOTENE) LIQD    15 mLs by Mouth Rinse route 2 (two) times daily.   APIXABAN  (ELIQUIS ) 5 MG TABS TABLET    Take 1 tablet (5 mg total) by mouth 2  (two) times daily.   ARTIFICIAL SALIVA (ACT DRY MOUTH) LOZG    Use as directed 1 drop in the mouth or throat as needed (dry mouth).   BIMATOPROST  (LUMIGAN ) 0.01 % SOLN    Place 1 drop into both eyes at bedtime.    CALCIUM  CITRATE-VITAMIN D  (CALCIUM  + D PO)    Take 1 tablet by mouth in the morning.   CHOLECALCIFEROL  (VITAMIN D3 SUPER STRENGTH) 50 MCG (2000 UT) TABS    Take 2,000 Units by mouth daily.   CYANOCOBALAMIN (VITAMIN B12) 1000 MCG TABLET    Take 1,000 mcg by mouth in the morning.   DENTIFRICES (BIOTENE DRY MOUTH GENTLE) PSTE    Place 1 Application onto teeth in the morning and at bedtime.   DOCUSATE CALCIUM  (STOOL SOFTENER PO)    Take 100 mg by mouth 3 (three) times a week. At night   DORZOLAMIDE  (TRUSOPT ) 2 % OPHTHALMIC SOLUTION    Place 1 drop into both eyes 3 (three) times daily.   ESOMEPRAZOLE  (NEXIUM ) 20 MG CAPSULE    TAKE 1 CAPSULE (20 MG TOTAL) BY MOUTH AS NEEDED   EZETIMIBE  (ZETIA ) 10 MG TABLET    TAKE ONE TABLET BY MOUTH ONCE DAILY   HYDROQUINONE  4 % CREAM    Apply topically daily.   LOPERAMIDE  (IMODIUM  A-D) 2 MG TABLET    Take 1 tablet (2 mg total) by mouth 3 (three) times daily as needed for diarrhea or loose stools.   MAGNESIUM  OXIDE (MAGNESIUM  EXTRA STRENGTH PO)    Apply 1 Application topically as needed (knee pain.). MAGNESIUM  OIL   METHYLCELLULOSE (ARTIFICIAL TEARS) 1 % OPHTHALMIC SOLUTION    Place 1 drop into both eyes 3 (three) times daily as needed (for dry eyes).   METOPROLOL  TARTRATE (LOPRESSOR ) 25 MG TABLET    Take 1 tablet (25 mg total) by mouth 2 (two) times daily.   OVER THE COUNTER MEDICATION    Take 1 capsule by mouth 2 (two) times daily. HydroEye Softgels Dry Eye Relief   PROPAFENONE  (RYTHMOL  SR) 225 MG 12 HR CAPSULE    TAKE 1 CAPSULE BY  MOUTH TWICE A DAY   SODIUM CHLORIDE  (OCEAN) 0.65 % SOLN NASAL SPRAY    Place 1 spray into both nostrils 2 (two) times daily as needed (dry nasal passages). [employer] Saline Nasal Moisturizing Spray   TRAMADOL  (ULTRAM ) 50 MG TABLET     Take by mouth.   TRAZODONE  (DESYREL ) 50 MG TABLET    TAKE ONE-HALF TO 1 TABLET BY MOUTH AT BEDTIME AS NEEDED   VALSARTAN -HYDROCHLOROTHIAZIDE  (DIOVAN -HCT) 320-25 MG TABLET    TAKE ONE TABLET BY MOUTH DAILY  Modified Medications   No medications on file  Discontinued Medications   No medications on file    Physical Exam:  Vitals:   08/18/24 1510  BP: 122/76  Pulse: 67  Temp: (!) 97.4 F (36.3 C)  SpO2: 95%  Weight: 183 lb (83 kg)  Height: 5' 2 (1.575 m)   Body mass index is 33.47 kg/m. Wt Readings from Last 3 Encounters:  08/18/24 183 lb (83 kg)  07/01/24 182 lb 3.2 oz (82.6 kg)  06/20/24 183 lb 3.2 oz (83.1 kg)    Physical Exam Constitutional:      General: She is not in acute distress.    Appearance: She is well-developed. She is not diaphoretic.  HENT:     Head: Normocephalic and atraumatic.     Mouth/Throat:     Pharynx: No oropharyngeal exudate.  Eyes:     Conjunctiva/sclera: Conjunctivae normal.     Pupils: Pupils are equal, round, and reactive to light.  Cardiovascular:     Rate and Rhythm: Normal rate and regular rhythm.     Heart sounds: Normal heart sounds.  Pulmonary:     Effort: Pulmonary effort is normal.     Breath sounds: Normal breath sounds.  Abdominal:     General: Bowel sounds are normal.     Palpations: Abdomen is soft.  Musculoskeletal:     Cervical back: Normal range of motion and neck supple.     Right lower leg: No edema.     Left lower leg: No edema.  Skin:    General: Skin is warm and dry.  Neurological:     Mental Status: She is alert.  Psychiatric:        Mood and Affect: Mood normal.   ***  Labs reviewed: Basic Metabolic Panel: Recent Labs    02/18/24 1611 06/20/24 1117  NA 141 138  K 3.6 3.7  CL 104 102  CO2 28 29  GLUCOSE 97 94  BUN 21 14  CREATININE 0.96* 0.71  CALCIUM  9.2 9.5  TSH 1.93  --    Liver Function Tests: Recent Labs    02/18/24 1611  AST 20  ALT 17  BILITOT 0.4  PROT 6.9   No results for  input(s): LIPASE, AMYLASE in the last 8760 hours. No results for input(s): AMMONIA in the last 8760 hours. CBC: Recent Labs    02/18/24 1611 06/20/24 1117  WBC 6.5 5.9  NEUTROABS 3,205 3,157  HGB 12.8 12.4  HCT 38.6 38.3  MCV 91.5 92.1  PLT 190 180   Lipid Panel: Recent Labs    02/18/24 1611  CHOL 183  HDL 36*  LDLCALC 105*  TRIG 291*  CHOLHDL 5.1*   TSH: Recent Labs    02/18/24 1611  TSH 1.93   A1C: Lab Results  Component Value Date   HGBA1C 5.2 08/06/2017     Assessment/Plan *** There are no diagnoses linked to this encounter.   No follow-ups on file.: ***  Harlene  LOIS An, ELNITA [facility] [phone]

## 2024-08-21 ENCOUNTER — Encounter: Payer: Self-pay | Admitting: Physician Assistant

## 2024-08-21 ENCOUNTER — Ambulatory Visit: Admitting: Physician Assistant

## 2024-08-21 DIAGNOSIS — M65332 Trigger finger, left middle finger: Secondary | ICD-10-CM | POA: Diagnosis not present

## 2024-08-21 DIAGNOSIS — M48062 Spinal stenosis, lumbar region with neurogenic claudication: Secondary | ICD-10-CM | POA: Insufficient documentation

## 2024-08-21 DIAGNOSIS — R197 Diarrhea, unspecified: Secondary | ICD-10-CM | POA: Insufficient documentation

## 2024-08-21 DIAGNOSIS — N183 Chronic kidney disease, stage 3 unspecified: Secondary | ICD-10-CM | POA: Insufficient documentation

## 2024-08-21 DIAGNOSIS — R0981 Nasal congestion: Secondary | ICD-10-CM | POA: Insufficient documentation

## 2024-08-21 DIAGNOSIS — E785 Hyperlipidemia, unspecified: Secondary | ICD-10-CM | POA: Insufficient documentation

## 2024-08-21 NOTE — Assessment & Plan Note (Signed)
 Continues on zetia  with dietary modifications encouraged

## 2024-08-21 NOTE — Assessment & Plan Note (Signed)
 Rate controlled on lopressor , continues on eliquis  for anticoagulation No signs of bleeding Followed by cardiology.

## 2024-08-21 NOTE — Assessment & Plan Note (Signed)
 To add probiotic such as florastor daily

## 2024-08-21 NOTE — Assessment & Plan Note (Signed)
 Blood pressure well controlled, goal bp <140/90 Continue current medications and dietary modifications follow metabolic panel

## 2024-08-21 NOTE — Assessment & Plan Note (Signed)
 Try xyzal 5 mg by mouth daily - instead of zyrtec for congestion/allergies Stop blowing nose hard Use nasal wash Okay to use flonase  1 spray into both nares twice daily to help with congestion/allergies.

## 2024-08-21 NOTE — Assessment & Plan Note (Signed)
 Overall doing very well. Continues to follow up with orthopedics, continue exercises.

## 2024-08-21 NOTE — Progress Notes (Signed)
 HPI: [name] returns today due to left middle finger continues to trigger.  She states that she is not having pain in the finger.  She is reluctant to move the finger.  She is unable to do playing the piano due to the finger triggering.  She is wearing a Band-Aid to help keep it from triggering.  She has had no new injury.  Last injection was 03/31/2024 for left long finger trigger finger.  Review of systems: See HPI otherwise negative or noncontributory.  Physical exam: General Well-developed well-nourished pleasant female in no acute distress mood and affect appropriate Left middle finger she has tenderness at the A1 pulley.  Active triggering.  No other triggering throughout the hand.  Impression: Left long finger trigger finger  Plan: Discussed with her continuing to do injections versus surgical release.  She would like to proceed with a surgical release.  This to be done under local anesthesia.  She would not need to come off her anticoagulants.  She would like for us  to gain approval by Dr. Swaziland prior to proceeding with surgery.  She reports that she recently saw him in the last month.  Questions were encouraged and answered by Dr. Jacob and myself.  She understands the postoperative protocol having undergone a right thumb trigger finger release and 2019.

## 2024-08-21 NOTE — Assessment & Plan Note (Signed)
 Okay to take trazodone  50 mg daily at bedtime every night Also can use with melatonin to aid in sleep.

## 2024-09-02 ENCOUNTER — Telehealth: Payer: Self-pay

## 2024-09-02 NOTE — Telephone Encounter (Signed)
   Pre-operative Risk Assessment    Patient Name: [name]  DOB: 01-Jan-1931 MRN: [id number]   Date of last office visit: 07/01/24 PETER SWAZILAND, MD Date of next office visit: NONE   Request for Surgical Clearance    Procedure:  LEFT LONG FINGER TRIGGER FINGER RELEASE  Date of Surgery:  Clearance TBD                                Surgeon:  LONNI POLI, MD Surgeon's Group or Practice Name:  [facility] Phone number:  [phone] Fax number:  [phone]   Type of Clearance Requested:   - Medical  - Pharmacy:  Hold Apixaban  (Eliquis ) PER REQUEST DOES NOT NEED TO STOP BLOOD THINNER   Type of Anesthesia:  MAC/ LOCAL   Additional requests/questions:    Signed, Lucie DELENA Ku   09/02/2024, 5:32 PM

## 2024-09-03 ENCOUNTER — Telehealth: Payer: Self-pay | Admitting: *Deleted

## 2024-09-03 NOTE — Telephone Encounter (Signed)
 S/w the pt and she has been scheduled tele preop appt 09/10/39. Med rec and consent are done.     Patient Consent for Virtual Visit        [name] has provided verbal consent on 09/03/2024 for a virtual visit (video or telephone).   CONSENT FOR VIRTUAL VISIT FOR:  [name] Dawn  By participating in this virtual visit I agree to the following:  I hereby voluntarily request, consent and authorize [facility] and its employed or contracted physicians, physician assistants, nurse practitioners or other licensed health care professionals (the Practitioner), to provide me with telemedicine health care services (the "Services) as deemed necessary by the treating Practitioner. I acknowledge and consent to receive the Services by the Practitioner via telemedicine. I understand that the telemedicine visit will involve communicating with the Practitioner through live audiovisual communication technology and the disclosure of certain medical information by electronic transmission. I acknowledge that I have been given the opportunity to request an in-person assessment or other available alternative prior to the telemedicine visit and am voluntarily participating in the telemedicine visit.  I understand that I have the right to withhold or withdraw my consent to the use of telemedicine in the course of my care at any time, without affecting my right to future care or treatment, and that the Practitioner or I may terminate the telemedicine visit at any time. I understand that I have the right to inspect all information obtained and/or recorded in the course of the telemedicine visit and may receive copies of available information for a reasonable fee.  I understand that some of the potential risks of receiving the Services via telemedicine include:  Delay or interruption in medical evaluation due to technological equipment failure or disruption; Information transmitted may not be sufficient (e.g.  poor resolution of images) to allow for appropriate medical decision making by the Practitioner; and/or  In rare instances, security protocols could fail, causing a breach of personal health information.  Furthermore, I acknowledge that it is my responsibility to provide information about my medical history, conditions and care that is complete and accurate to the best of my ability. I acknowledge that Practitioner's advice, recommendations, and/or decision may be based on factors not within their control, such as incomplete or inaccurate data provided by me or distortions of diagnostic images or specimens that may result from electronic transmissions. I understand that the practice of medicine is not an exact science and that Practitioner makes no warranties or guarantees regarding treatment outcomes. I acknowledge that a copy of this consent can be made available to me via my patient portal [facility] MyChart), or I can request a printed copy by calling the office of [facility].    I understand that my insurance will be billed for this visit.   I have read or had this consent read to me. I understand the contents of this consent, which adequately explains the benefits and risks of the Services being provided via telemedicine.  I have been provided ample opportunity to ask questions regarding this consent and the Services and have had my questions answered to my satisfaction. I give my informed consent for the services to be provided through the use of telemedicine in my medical care

## 2024-09-03 NOTE — Telephone Encounter (Signed)
   Name: BECKETT HICKMON  DOB: 08-22-1931  MRN: [id number]  Primary Cardiologist: Peter Swaziland, MD  Chart reviewed as part of pre-operative protocol coverage. Because of Quanita Barona Wierman's past medical history and time since last visit, she will require a follow-up telephone visit in order to better assess preoperative cardiovascular risk.  Pre-op covering staff: - Please schedule appointment and call patient to inform them. If patient already had an upcoming appointment within acceptable timeframe, please add pre-op clearance to the appointment notes so provider is aware. - Please contact requesting surgeon's office via preferred method (i.e, phone, fax) to inform them of need for appointment prior to surgery.  Per request, Eliquis  does not need to be held.   Orren LOISE Fabry, PA-C  09/03/2024, 7:22 AM

## 2024-09-03 NOTE — Telephone Encounter (Signed)
 Tried to call the pt to schedule tele preop appt . Someone answered the phone though no replied when I kept saying hello and called pt by name. Call on pt's end was disconnected.   Will try again later.

## 2024-09-03 NOTE — Telephone Encounter (Signed)
 S/w the pt and she has been scheduled tele preop appt 09/10/39. Med rec and consent are done.

## 2024-09-04 NOTE — Telephone Encounter (Signed)
 [name]

## 2024-09-09 ENCOUNTER — Ambulatory Visit: Attending: Cardiology

## 2024-09-09 DIAGNOSIS — Z01810 Encounter for preprocedural cardiovascular examination: Secondary | ICD-10-CM

## 2024-09-09 NOTE — Progress Notes (Signed)
 Virtual Visit via Telephone Note   Because of CLEA DUBACH co-morbid illnesses, she is at least at moderate risk for complications without adequate follow up.  This format is felt to be most appropriate for this patient at this time.  Due to technical limitations with video connection (technology), today's appointment will be conducted as an audio only telehealth visit, and [name] verbally agreed to proceed in this manner.   All issues noted in this document were discussed and addressed.  No physical exam could be performed with this format.  Evaluation Performed:  Preoperative cardiovascular risk assessment _____________   Date:  09/09/2024   Patient ID:  [name], DOB 1931/03/29, MRN [id number] Patient Location:  Home Provider location:   Office  Primary Care Provider:  [name], NP Primary Cardiologist:  Peter Swaziland, MD  Chief Complaint / Patient Profile  89 y.o. y/o female with a h/o atrial fibrillation/flutter s/p PPM, hypertension and hypercholesterolemia who is pending left long finger trigger release and presents today for telephonic preoperative cardiovascular risk assessment. History of Present Illness  [name] is a 89 y.o. female who presents via audio/video conferencing for a telehealth visit today.  Pt was last seen in cardiology clinic on 07/01/2024 by Dr. Swaziland.  At that time GWYNNE KEMNITZ was doing well.  The patient is now pending procedure as outlined above. Since her last visit, she has remained stable from a cardiac standpoint. Today she denies chest pain, shortness of breath, lower extremity edema, fatigue, palpitations, melena, hematuria, hemoptysis, diaphoresis, weakness, presyncope, syncope, orthopnea, and PND.  She is able to achieve greater than 4 METS of activity. Past Medical History    Past Medical History:  Diagnosis Date   A-fib [facility])    Allergic rhinitis due to pollen    Anxiety state, unspecified    Sitautional- pain    Arthritis    right knee (06/21/2016)   Asymptomatic varicose veins    Carpal tunnel syndrome    Chest pain, unspecified    Chronic lower back pain    on the left side (06/21/2016)   Constipation    Cramp of limb    Depressive disorder, not elsewhere classified    pt denies   Diaphragmatic hernia without mention of obstruction or gangrene    Diaphragmatic hernia without mention of obstruction or gangrene    Disturbance of skin sensation    Diverticulosis of colon (without mention of hemorrhage)    Dysrhythmia    Atrial Flutter   Enthesopathy of hip region    Esophageal reflux    , just occasional   History of blood transfusion    w/both knee replacements   History of duodenal ulcer    History of kidney stones    passed   Insomnia, unspecified    Lumbago    Migraine    none since ~ 1990 (06/21/2016)   Myalgia and myositis, unspecified    Obesity, unspecified    Other abnormal blood chemistry    Other dysphagia    Other malaise and fatigue    Other nonspecific abnormal serum enzyme levels    Other specified cardiac dysrhythmias(427.89)    Pacemaker    Pain in joint, ankle and foot    Pain in joint, hand    Pain in joint, lower leg    Pain in joint, pelvic region and thigh    Pain in limb    Plantar fascial fibromatosis    Presence of  permanent cardiac pacemaker    -St Jude   Reflux esophagitis    Sciatica    Spinal stenosis, unspecified region other than cervical    Stricture and stenosis of esophagus    Symptomatic menopausal or female climacteric states    Unspecified essential hypertension    Unspecified essential hypertension    Unspecified vitamin D  deficiency    Past Surgical History:  Procedure Laterality Date   BREAST BIOPSY Left 1990s X 2   CARDIOVERSION N/A 04/26/2016   Procedure: CARDIOVERSION;  Surgeon: Vinie JAYSON Maxcy, MD;  Location: [facility] ENDOSCOPY;  Service: Cardiovascular;  Laterality: N/A;   CARDIOVERSION N/A 12/04/2022   Procedure:  CARDIOVERSION;  Surgeon: Loni Soyla LABOR, MD;  Location: [facility] ENDOSCOPY;  Service: Cardiovascular;  Laterality: N/A;   CATARACT EXTRACTION W/ INTRAOCULAR LENS IMPLANT Left 08/03/1999   DR EPES    CATARACT EXTRACTION W/ INTRAOCULAR LENS IMPLANT Right 2000   DR EPES   CHOLECYSTECTOMY OPEN  1989   DR BOWMAN   COLONOSCOPY  1988   Normal    DENTAL SURGERY Left 08/2016   EP IMPLANTABLE DEVICE N/A 06/21/2016   Procedure: Pacemaker Implant;  Surgeon: Danelle LELON Birmingham, MD;  Location: [facility] INVASIVE CV LAB;  Service: Cardiovascular;  Laterality: N/A;   ESOPHAGOGASTRODUODENOSCOPY (EGD) WITH ESOPHAGEAL DILATION  ~ 1982   Dr. Jakie   EXCISION OF ACTINIC KERATOSIS     DR LUPTON    INSERT / REPLACE / REMOVE PACEMAKER  06/21/2016   St Jude   KNEE ARTHROSCOPY Left 2003   KNEE ARTHROSCOPY Right 06/26/2013   KNEE CLOSED REDUCTION Right 10/23/2013   Procedure: CLOSED MANIPULATION RIGHT KNEE;  Surgeon: Lonni CINDERELLA Poli, MD;  Location: [facility] OR;  Service: Orthopedics;  Laterality: Right;   LASER FOR CLOUDY CAP LEFT EYE Left 03/2006   DR DIGBY   LUMBAR LAMINECTOMY/DECOMPRESSION MICRODISCECTOMY N/A 06/12/2019   Procedure: Laminectomy and Foraminotomy - Lumbar four-Lumbar five;  Surgeon: Joshua Alm RAMAN, MD;  Location: [facility] OR;  Service: Neurosurgery;  Laterality: N/A;   LUMBAR LAMINECTOMY/DECOMPRESSION MICRODISCECTOMY Bilateral 07/16/2023   Procedure: Right- - Lumbar three-Lumbar four - Lumbar four-Lumbar five Decompressive laminectomy;  Surgeon: Joshua Alm Hamilton, MD;  Location: [facility] OR;  Service: Neurosurgery;  Laterality: Bilateral;   SKIN SURGERY  04/2022   [facility] Dermatology, Dr.Gray. Removed area on upper chest   TOTAL KNEE ARTHROPLASTY Left 04/2004   DR RENDALL   TOTAL KNEE ARTHROPLASTY Right 07/04/2013   Procedure: RIGHT TOTAL KNEE ARTHROPLASTY;  Surgeon: Lonni CINDERELLA Poli, MD;  Location: [facility] ORS;  Service: Orthopedics;  Laterality: Right;   TRIGGER FINGER RELEASE Right 09/2018   VAGINAL  HYSTERECTOMY  1979    Allergies  Allergies  Allergen Reactions   Advil [Ibuprofen] Other (See Comments)    GI upset   Aleve [Naproxen Sodium] Other (See Comments)    GI upset   Aspirin  Other (See Comments)    Heartburn/indigestion.   Atorvastatin  Other (See Comments)    Leg cramps   Brimonidine     Irritated left eye, headaches, burning, itching and foreign object sensation    Celebrex [Celecoxib] Other (See Comments)    GI upset   Diclofenac     GI upset   Doxycycline  Other (See Comments)    Headache   Nsaids Other (See Comments)    Tears my stomach up    Other     Antihistamine - increase BP   Pravastatin  Other (See Comments)    Leg cramps   Timolol Other (See Comments)  Dropped HR   Metoclopramide  Hcl Palpitations    Home Medications    Prior to Admission medications   Medication Sig Start Date End Date Taking? Authorizing Provider  acetaminophen  (TYLENOL ) 500 MG tablet Take 500-1,000 mg by mouth every 6 (six) hours as needed for moderate pain.    [provider]  antiseptic oral rinse (BIOTENE) LIQD 15 mLs by Mouth Rinse route 2 (two) times daily.    [provider]  apixaban  (ELIQUIS ) 5 MG TABS tablet Take 1 tablet (5 mg total) by mouth 2 (two) times daily. 01/03/24   Swaziland, Peter M, MD  Artificial Saliva (ACT DRY MOUTH) LOZG Use as directed 1 drop in the mouth or throat as needed (dry mouth).    [provider]  bimatoprost  (LUMIGAN ) 0.01 % SOLN Place 1 drop into both eyes at bedtime.     [provider]  Calcium  Citrate-Vitamin D  (CALCIUM  + D PO) Take 1 tablet by mouth in the morning.    [provider]  Cholecalciferol  (VITAMIN D3 SUPER STRENGTH) 50 MCG (2000 UT) TABS Take 2,000 Units by mouth daily.    [provider]  cyanocobalamin (VITAMIN B12) 1000 MCG tablet Take 1,000 mcg by mouth in the morning.    [provider]  Dentifrices (BIOTENE DRY MOUTH GENTLE) PSTE Place 1 Application onto teeth in  the morning and at bedtime.    [provider]  Docusate Calcium  (STOOL SOFTENER PO) Take 100 mg by mouth 3 (three) times a week. At night    [provider]  dorzolamide  (TRUSOPT ) 2 % ophthalmic solution Place 1 drop into both eyes 3 (three) times daily.    [provider]  esomeprazole  (NEXIUM ) 20 MG capsule TAKE 1 CAPSULE (20 MG TOTAL) BY MOUTH AS NEEDED 01/14/24   Eubanks, Jessica K, NP  ezetimibe  (ZETIA ) 10 MG tablet TAKE ONE TABLET BY MOUTH ONCE DAILY 10/10/23   Eubanks, Jessica K, NP  hydroquinone  4 % cream Apply topically daily. 07/04/24   [name], NP  loperamide  (IMODIUM  A-D) 2 MG tablet Take 1 tablet (2 mg total) by mouth 3 (three) times daily as needed for diarrhea or loose stools. 06/20/24   Medina-Vargas, Monina C, NP  Magnesium  Oxide (MAGNESIUM  EXTRA STRENGTH PO) Apply 1 Application topically as needed (knee pain.). MAGNESIUM  OIL    [provider]  methylcellulose (ARTIFICIAL TEARS) 1 % ophthalmic solution Place 1 drop into both eyes 3 (three) times daily as needed (for dry eyes).    [provider]  metoprolol  tartrate (LOPRESSOR ) 25 MG tablet Take 1 tablet (25 mg total) by mouth 2 (two) times daily. 06/04/24   Leverne Charlies Helling, PA-C  propafenone  (RYTHMOL  SR) 225 MG 12 hr capsule TAKE 1 CAPSULE BY MOUTH TWICE A DAY 07/04/24   Swaziland, Peter M, MD  sodium chloride  (OCEAN) 0.65 % SOLN nasal spray Place 1 spray into both nostrils 2 (two) times daily as needed (dry nasal passages). [employer] Saline Nasal Moisturizing Spray    [provider]  traMADol  (ULTRAM ) 50 MG tablet Take by mouth. Patient taking differently: Take 25 mg by mouth at bedtime. 07/31/23   [provider]  traZODone  (DESYREL ) 50 MG tablet TAKE ONE-HALF TO 1 TABLET BY MOUTH AT BEDTIME AS NEEDED 07/16/24   Eubanks, Jessica K, NP  valsartan -hydrochlorothiazide  (DIOVAN -HCT) 320-25 MG tablet TAKE ONE TABLET BY MOUTH DAILY 10/18/23   [name], NP     Physical Exam  Vital Signs:  NELLINE LIO does  not have vital signs available for review today. Given telephonic nature of communication, physical exam is limited. AAOx3. NAD. Normal affect.  Speech and respirations are unlabored. Accessory Clinical Findings   None Assessment & Plan    1.  Preoperative Cardiovascular Risk Assessment: Ms. Noblet's perioperative risk of a major cardiac event is 0.4% according to the Revised Cardiac Risk Index (RCRI).  Therefore, she is at low risk for perioperative complications.   Her functional capacity is good at 6.05 METs according to the Duke Activity Status Index (DASI). Recommendations: According to ACC/AHA guidelines, no further cardiovascular testing needed.  The patient may proceed to surgery at acceptable risk.   Antiplatelet and/or Anticoagulation Recommendations: Per clearance request patient does not need to stop Eliquis  for surgery.   The patient was advised that if she develops new symptoms prior to surgery to contact our office to arrange for a follow-up visit, and she verbalized understanding.  A copy of this note will be routed to requesting surgeon.  Time:   Today, I have spent 14 minutes with the patient with telehealth technology discussing medical history, symptoms, and management plan.    Nyla Creason D Joson Sapp, NP  09/09/2024, 4:37 PM

## 2024-09-11 ENCOUNTER — Encounter: Payer: Self-pay | Admitting: Internal Medicine

## 2024-09-11 ENCOUNTER — Other Ambulatory Visit: Payer: Self-pay | Admitting: Physician Assistant

## 2024-09-11 ENCOUNTER — Other Ambulatory Visit: Payer: Self-pay

## 2024-09-11 NOTE — Progress Notes (Signed)
 PERIOPERATIVE PRESCRIPTION FOR IMPLANTED CARDIAC DEVICE PROGRAMMING  Patient Information: Name:  [name]  DOB:  May 17, 1931  MRN:  [id number]    Planned Procedure:  trigger finger  Surgeon:  Vernetta Barefoot  Date of Procedure:  09/18/2024  Cautery will be used.  Position during surgery:  supine   Device Information:  Clinic EP Physician:  Danelle Birmingham, MD   Device Type:  Pacemaker Manufacturer and Phone #:  St. Jude/Abbott: [phone] Pacemaker Dependent?:  Yes.   Date of Last Device Check:  06/17/24  Normal Device Function?:  Yes.    Electrophysiologist's Recommendations:  Have magnet available. Provide continuous ECG monitoring when magnet is used or reprogramming is to be performed.  Procedure should not interfere with device function.  No device programming or magnet placement needed.  Per Device Clinic Standing Orders, [place], [place]  3:34 PM 09/11/2024

## 2024-09-15 ENCOUNTER — Encounter: Payer: Self-pay | Admitting: Nurse Practitioner

## 2024-09-15 ENCOUNTER — Encounter (HOSPITAL_BASED_OUTPATIENT_CLINIC_OR_DEPARTMENT_OTHER)
Admission: RE | Admit: 2024-09-15 | Discharge: 2024-09-15 | Disposition: A | Discharge status: Home / Self Care | Source: Ambulatory Visit | Attending: Orthopaedic Surgery | Admitting: Orthopaedic Surgery

## 2024-09-15 DIAGNOSIS — Z01812 Encounter for preprocedural laboratory examination: Secondary | ICD-10-CM | POA: Diagnosis not present

## 2024-09-15 LAB — BASIC METABOLIC PANEL WITH GFR
Anion gap: 9 (ref 5–15)
BUN: 10 mg/dL (ref 8–23)
CO2: 27 mmol/L (ref 22–32)
Calcium: 9 mg/dL (ref 8.9–10.3)
Chloride: 103 mmol/L (ref 98–111)
Creatinine, Ser: 0.79 mg/dL (ref 0.44–1.00)
GFR, Estimated: >60 mL/min (ref >60)
Glucose, Bld: 99 mg/dL (ref 70–99)
Potassium: 4.3 mmol/L (ref 3.5–5.1)
Sodium: 139 mmol/L (ref 135–145)

## 2024-09-15 NOTE — Telephone Encounter (Signed)
 Yes message was routed to PCP Caro, Harlene POUR, NP

## 2024-09-15 NOTE — Telephone Encounter (Signed)
 Copied from CRM [id number]. Topic: MyChart - Other >> Sep 15, 2024 12:03 PM Antonio H wrote: Reason for CRM: Patient wants to make sure someone reads/responds to her MyChart message that she sent today regarding her finger surgery.

## 2024-09-16 ENCOUNTER — Ambulatory Visit (INDEPENDENT_AMBULATORY_CARE_PROVIDER_SITE_OTHER): Payer: Medicare Other

## 2024-09-16 DIAGNOSIS — I495 Sick sinus syndrome: Secondary | ICD-10-CM | POA: Diagnosis not present

## 2024-09-16 NOTE — Telephone Encounter (Signed)
 Called and spoke with patient.

## 2024-09-16 NOTE — Telephone Encounter (Signed)
 Noted

## 2024-09-17 DIAGNOSIS — M65332 Trigger finger, left middle finger: Secondary | ICD-10-CM | POA: Insufficient documentation

## 2024-09-17 LAB — CUP PACEART REMOTE DEVICE CHECK
Battery Remaining Longevity: 22 mo
Battery Remaining Percentage: 22 %
Battery Voltage: 2.87 V
Brady Statistic AP VP Percent: 14 %
Brady Statistic AP VS Percent: 85 %
Brady Statistic AS VP Percent: 1 %
Brady Statistic AS VS Percent: 1 %
Brady Statistic RA Percent Paced: 99 %
Brady Statistic RV Percent Paced: 15 %
Date Time Interrogation Session: 20250923020014
Implantable Lead Connection Status: 753985
Implantable Lead Connection Status: 753985
Implantable Lead Implant Date: 20170628
Implantable Lead Implant Date: 20170628
Implantable Lead Location: 753859
Implantable Lead Location: 753860
Implantable Pulse Generator Implant Date: 20170628
Lead Channel Impedance Value: 530 Ohm
Lead Channel Impedance Value: 560 Ohm
Lead Channel Pacing Threshold Amplitude: 0.75 V
Lead Channel Pacing Threshold Amplitude: 1.5 V
Lead Channel Pacing Threshold Pulse Width: 0.5 ms
Lead Channel Pacing Threshold Pulse Width: 0.5 ms
Lead Channel Sensing Intrinsic Amplitude: 1.3 mV
Lead Channel Sensing Intrinsic Amplitude: 10.4 mV
Lead Channel Setting Pacing Amplitude: 2 V
Lead Channel Setting Pacing Amplitude: 3 V
Lead Channel Setting Pacing Pulse Width: 0.5 ms
Lead Channel Setting Sensing Sensitivity: 2 mV
Pulse Gen Model: 2272
Pulse Gen Serial Number: 7910313

## 2024-09-17 NOTE — H&P (Signed)
 [name] is an 89 y.o. female.   Chief Complaint: Recurrent triggering of left middle finger HPI: The patient is a 89 year old female with recurrent triggering of her left middle finger.  She has tried and failed conservative treatment over the A1 pulley and at this point presents for an A1 pulley release under local anesthesia and light sedation.  Past Medical History:  Diagnosis Date   A-fib [facility])    Allergic rhinitis due to pollen    Anxiety state, unspecified    Sitautional- pain   Arthritis    right knee (06/21/2016)   Asymptomatic varicose veins    Carpal tunnel syndrome    Chest pain, unspecified    Chronic lower back pain    on the left side (06/21/2016)   Constipation    Cramp of limb    Depressive disorder, not elsewhere classified    pt denies   Diaphragmatic hernia without mention of obstruction or gangrene    Diaphragmatic hernia without mention of obstruction or gangrene    Disturbance of skin sensation    Diverticulosis of colon (without mention of hemorrhage)    Dysrhythmia    Atrial Flutter   Enthesopathy of hip region    Esophageal reflux    , just occasional   History of blood transfusion    w/both knee replacements   History of duodenal ulcer    History of kidney stones    passed   Insomnia, unspecified    Lumbago    Migraine    none since ~ 1990 (06/21/2016)   Myalgia and myositis, unspecified    Obesity, unspecified    Other abnormal blood chemistry    Other dysphagia    Other malaise and fatigue    Other nonspecific abnormal serum enzyme levels    Other specified cardiac dysrhythmias(427.89)    Pacemaker    Pain in joint, ankle and foot    Pain in joint, hand    Pain in joint, lower leg    Pain in joint, pelvic region and thigh    Pain in limb    Plantar fascial fibromatosis    Presence of permanent cardiac pacemaker    -St Jude   Reflux esophagitis    Sciatica    Spinal stenosis, unspecified region other than cervical     Stricture and stenosis of esophagus    Symptomatic menopausal or female climacteric states    Unspecified essential hypertension    Unspecified essential hypertension    Unspecified vitamin D  deficiency     Past Surgical History:  Procedure Laterality Date   BREAST BIOPSY Left 1990s X 2   CARDIOVERSION N/A 04/26/2016   Procedure: CARDIOVERSION;  Surgeon: Vinie JAYSON Maxcy, MD;  Location: [facility] ENDOSCOPY;  Service: Cardiovascular;  Laterality: N/A;   CARDIOVERSION N/A 12/04/2022   Procedure: CARDIOVERSION;  Surgeon: Loni Soyla LABOR, MD;  Location: [facility] ENDOSCOPY;  Service: Cardiovascular;  Laterality: N/A;   CATARACT EXTRACTION W/ INTRAOCULAR LENS IMPLANT Left 08/03/1999   DR EPES    CATARACT EXTRACTION W/ INTRAOCULAR LENS IMPLANT Right 2000   DR EPES   CHOLECYSTECTOMY OPEN  1989   DR BOWMAN   COLONOSCOPY  1988   Normal    DENTAL SURGERY Left 08/2016   EP IMPLANTABLE DEVICE N/A 06/21/2016   Procedure: Pacemaker Implant;  Surgeon: Danelle LELON Birmingham, MD;  Location: [facility] INVASIVE CV LAB;  Service: Cardiovascular;  Laterality: N/A;   ESOPHAGOGASTRODUODENOSCOPY (EGD) WITH ESOPHAGEAL DILATION  ~ 1982   Dr. Jakie  EXCISION OF ACTINIC KERATOSIS     DR LUPTON    INSERT / REPLACE / REMOVE PACEMAKER  06/21/2016   St Jude   KNEE ARTHROSCOPY Left 2003   KNEE ARTHROSCOPY Right 06/26/2013   KNEE CLOSED REDUCTION Right 10/23/2013   Procedure: CLOSED MANIPULATION RIGHT KNEE;  Surgeon: Lonni CINDERELLA Poli, MD;  Location: [facility] OR;  Service: Orthopedics;  Laterality: Right;   LASER FOR CLOUDY CAP LEFT EYE Left 03/2006   DR DIGBY   LUMBAR LAMINECTOMY/DECOMPRESSION MICRODISCECTOMY N/A 06/12/2019   Procedure: Laminectomy and Foraminotomy - Lumbar four-Lumbar five;  Surgeon: Joshua Alm RAMAN, MD;  Location[facility] OR;  Service: Neurosurgery;  Laterality: N/A;   LUMBAR LAMINECTOMY/DECOMPRESSION MICRODISCECTOMY Bilateral 07/16/2023   Procedure: Right- - Lumbar three-Lumbar four - Lumbar four-Lumbar five  Decompressive laminectomy;  Surgeon: Joshua Alm Hamilton, MD;  Location: [facility] OR;  Service: Neurosurgery;  Laterality: Bilateral;   SKIN SURGERY  04/2022   [facility] Dermatology, Dr.Gray. Removed area on upper chest   TOTAL KNEE ARTHROPLASTY Left 04/2004   DR RENDALL   TOTAL KNEE ARTHROPLASTY Right 07/04/2013   Procedure: RIGHT TOTAL KNEE ARTHROPLASTY;  Surgeon: Lonni CINDERELLA Poli, MD;  Location: [facility] ORS;  Service: Orthopedics;  Laterality: Right;   TRIGGER FINGER RELEASE Right 09/2018   VAGINAL HYSTERECTOMY  1979    Family History  Problem Relation Age of Onset   Ovarian cancer Mother    Uterine cancer Mother    Cerebrovascular Accident Mother    Heart disease Father    Hypertension Brother    Obesity Daughter    Social History:  reports that she has never smoked. She has never used smokeless tobacco. She reports that she does not drink alcohol  and does not use drugs.  Allergies:  Allergies  Allergen Reactions   Advil [Ibuprofen] Other (See Comments)    GI upset   Aleve [Naproxen Sodium] Other (See Comments)    GI upset   Aspirin  Other (See Comments)    Heartburn/indigestion.   Atorvastatin  Other (See Comments)    Leg cramps   Brimonidine     Irritated left eye, headaches, burning, itching and foreign object sensation    Celebrex [Celecoxib] Other (See Comments)    GI upset   Diclofenac     GI upset   Doxycycline  Other (See Comments)    Headache   Nsaids Other (See Comments)    Tears my stomach up    Other     Antihistamine - increase BP   Pravastatin  Other (See Comments)    Leg cramps   Timolol Other (See Comments)    Dropped HR   Metoclopramide  Hcl Palpitations    No medications prior to admission.    No results found for this or any previous visit (from the past 48 hours). CUP PACEART REMOTE DEVICE CHECK Result Date: 09/17/2024 PPM Scheduled remote reviewed. Normal device function.  Presenting rhythm:  AP/VS Next remote 91 days. LA, CVRS   Review of  Systems  Height 5' 2 (1.575 m), weight 81.2 kg. Physical Exam Vitals reviewed.  Constitutional:      Appearance: Normal appearance. She is normal weight.  HENT:     Head: Normocephalic and atraumatic.  Eyes:     Extraocular Movements: Extraocular movements intact.     Pupils: Pupils are equal, round, and reactive to light.  Cardiovascular:     Rate and Rhythm: Normal rate.  Pulmonary:     Effort: Pulmonary effort is normal.  Abdominal:     Palpations: Abdomen is  soft.  Musculoskeletal:     Left hand: Tenderness present.     Cervical back: Neck supple.  Neurological:     Mental Status: She is alert and oriented to person, place, and time.  Psychiatric:        Behavior: Behavior normal.   There is tenderness over the left hand middle finger A1 pulley with active triggering of the left middle finger.  Assessment/Plan Recurrent trigger finger left middle finger  The plan is to proceed to surgery as an outpatient for a left middle finger A1 pulley release to treat the triggering.  This can be done under light sedation and local anesthesia.  The risks and benefits of surgery have been discussed in detail.  Lonni CINDERELLA Poli, MD 09/17/2024, 4:55 PM

## 2024-09-17 NOTE — Progress Notes (Signed)
 Remote PPM Transmission

## 2024-09-18 ENCOUNTER — Other Ambulatory Visit: Payer: Self-pay

## 2024-09-18 ENCOUNTER — Encounter (HOSPITAL_BASED_OUTPATIENT_CLINIC_OR_DEPARTMENT_OTHER)
Admission: RE | Disposition: A | Discharge status: Home / Self Care | Payer: Self-pay | Source: Home / Self Care | Attending: Orthopaedic Surgery

## 2024-09-18 ENCOUNTER — Ambulatory Visit (HOSPITAL_BASED_OUTPATIENT_CLINIC_OR_DEPARTMENT_OTHER): Admitting: Anesthesiology

## 2024-09-18 ENCOUNTER — Ambulatory Visit (HOSPITAL_BASED_OUTPATIENT_CLINIC_OR_DEPARTMENT_OTHER)
Admission: RE | Admit: 2024-09-18 | Discharge: 2024-09-18 | Disposition: A | Discharge status: Home / Self Care | Attending: Orthopaedic Surgery | Admitting: Orthopaedic Surgery

## 2024-09-18 ENCOUNTER — Encounter (HOSPITAL_BASED_OUTPATIENT_CLINIC_OR_DEPARTMENT_OTHER): Payer: Self-pay | Admitting: Orthopaedic Surgery

## 2024-09-18 DIAGNOSIS — E669 Obesity, unspecified: Secondary | ICD-10-CM | POA: Insufficient documentation

## 2024-09-18 DIAGNOSIS — I4891 Unspecified atrial fibrillation: Secondary | ICD-10-CM | POA: Diagnosis not present

## 2024-09-18 DIAGNOSIS — Z7901 Long term (current) use of anticoagulants: Secondary | ICD-10-CM | POA: Insufficient documentation

## 2024-09-18 DIAGNOSIS — I10 Essential (primary) hypertension: Secondary | ICD-10-CM | POA: Insufficient documentation

## 2024-09-18 DIAGNOSIS — Z6833 Body mass index (BMI) 33.0-33.9, adult: Secondary | ICD-10-CM | POA: Insufficient documentation

## 2024-09-18 DIAGNOSIS — I129 Hypertensive chronic kidney disease with stage 1 through stage 4 chronic kidney disease, or unspecified chronic kidney disease: Secondary | ICD-10-CM

## 2024-09-18 DIAGNOSIS — N183 Chronic kidney disease, stage 3 unspecified: Secondary | ICD-10-CM | POA: Diagnosis not present

## 2024-09-18 DIAGNOSIS — Z01818 Encounter for other preprocedural examination: Secondary | ICD-10-CM

## 2024-09-18 DIAGNOSIS — M65332 Trigger finger, left middle finger: Secondary | ICD-10-CM | POA: Insufficient documentation

## 2024-09-18 DIAGNOSIS — Z950 Presence of cardiac pacemaker: Secondary | ICD-10-CM | POA: Insufficient documentation

## 2024-09-18 HISTORY — PX: TRIGGER FINGER RELEASE: SHX641

## 2024-09-18 SURGERY — RELEASE, A1 PULLEY, FOR TRIGGER FINGER
Anesthesia: Monitor Anesthesia Care | Site: Hand | Laterality: Left

## 2024-09-18 MED ORDER — PROPOFOL 10 MG/ML IV BOLUS
INTRAVENOUS | Status: DC | PRN
  Administered 2024-09-18: 20 mg via INTRAVENOUS

## 2024-09-18 MED ORDER — ACETAMINOPHEN 500 MG PO TABS
1000.0000 mg | ORAL_TABLET | Freq: Once | ORAL | Status: AC
  Administered 2024-09-18: 1000 mg via ORAL

## 2024-09-18 MED ORDER — CEFAZOLIN SODIUM-DEXTROSE 2-4 GM/100ML-% IV SOLN
2.0000 g | INTRAVENOUS | Status: AC
  Administered 2024-09-18: 2 g via INTRAVENOUS

## 2024-09-18 MED ORDER — LIDOCAINE 2% (20 MG/ML) 5 ML SYRINGE
INTRAMUSCULAR | Status: AC
Start: 2024-09-18 — End: 2024-09-18
  Filled 2024-09-18: qty 5

## 2024-09-18 MED ORDER — PROPOFOL 500 MG/50ML IV EMUL
INTRAVENOUS | Status: AC
  Filled 2024-09-18: qty 50

## 2024-09-18 MED ORDER — ACETAMINOPHEN 500 MG PO TABS
ORAL_TABLET | ORAL | Status: AC
  Filled 2024-09-18: qty 2

## 2024-09-18 MED ORDER — 0.9 % SODIUM CHLORIDE (POUR BTL) OPTIME
TOPICAL | Status: DC | PRN
  Administered 2024-09-18: 120 mL

## 2024-09-18 MED ORDER — BUPIVACAINE HCL (PF) 0.25 % IJ SOLN
INTRAMUSCULAR | Status: DC | PRN
  Administered 2024-09-18: 2 mL

## 2024-09-18 MED ORDER — FENTANYL CITRATE (PF) 100 MCG/2ML IJ SOLN
INTRAMUSCULAR | Status: AC
  Filled 2024-09-18: qty 2

## 2024-09-18 MED ORDER — FENTANYL CITRATE (PF) 100 MCG/2ML IJ SOLN: 25.0000 ug | INTRAMUSCULAR | Status: DC | PRN

## 2024-09-18 MED ORDER — DEXAMETHASONE SODIUM PHOSPHATE 10 MG/ML IJ SOLN
INTRAMUSCULAR | Status: AC
  Filled 2024-09-18: qty 1

## 2024-09-18 MED ORDER — TRAMADOL HCL 50 MG PO TABS: 50.0000 mg | ORAL_TABLET | Freq: Four times a day (QID) | ORAL | 0 refills | Status: DC | PRN

## 2024-09-18 MED ORDER — CEFAZOLIN SODIUM-DEXTROSE 2-4 GM/100ML-% IV SOLN
INTRAVENOUS | Status: AC
  Filled 2024-09-18: qty 100

## 2024-09-18 MED ORDER — FENTANYL CITRATE (PF) 100 MCG/2ML IJ SOLN
INTRAMUSCULAR | Status: DC | PRN
  Administered 2024-09-18: 25 ug via INTRAVENOUS

## 2024-09-18 MED ORDER — ONDANSETRON HCL 4 MG/2ML IJ SOLN
INTRAMUSCULAR | Status: AC
  Filled 2024-09-18: qty 2

## 2024-09-18 MED ORDER — DEXAMETHASONE SODIUM PHOSPHATE 4 MG/ML IJ SOLN
INTRAMUSCULAR | Status: DC | PRN
  Administered 2024-09-18: 4 mg via INTRAVENOUS

## 2024-09-18 MED ORDER — ONDANSETRON HCL 4 MG/2ML IJ SOLN
INTRAMUSCULAR | Status: DC | PRN
  Administered 2024-09-18: 4 mg via INTRAVENOUS

## 2024-09-18 MED ORDER — PROPOFOL 500 MG/50ML IV EMUL
INTRAVENOUS | Status: DC | PRN
  Administered 2024-09-18: 50 ug/kg/min via INTRAVENOUS

## 2024-09-18 MED ORDER — LIDOCAINE HCL (PF) 1 % IJ SOLN
INTRAMUSCULAR | Status: DC | PRN
  Administered 2024-09-18: 7 mL

## 2024-09-18 MED ORDER — LACTATED RINGERS IV SOLN: INTRAVENOUS | Status: DC

## 2024-09-18 MED ORDER — LIDOCAINE HCL (CARDIAC) PF 100 MG/5ML IV SOSY
PREFILLED_SYRINGE | INTRAVENOUS | Status: DC | PRN
  Administered 2024-09-18: 40 mg via INTRAVENOUS

## 2024-09-18 SURGICAL SUPPLY — 31 items
BLADE SURG 15 STRL LF DISP TIS (BLADE) ×4 IMPLANT
BNDG COHESIVE 1X5 TAN STRL LF (GAUZE/BANDAGES/DRESSINGS) ×2 IMPLANT
BNDG COMPR ESMARK 4X3 LF (GAUZE/BANDAGES/DRESSINGS) IMPLANT
BNDG ELASTIC 3INX 5YD STR LF (GAUZE/BANDAGES/DRESSINGS) IMPLANT
CORD BIPOLAR FORCEPS 12FT (ELECTRODE) ×2 IMPLANT
COVER BACK TABLE 60X90IN (DRAPES) ×2 IMPLANT
COVER MAYO STAND STRL (DRAPES) ×2 IMPLANT
CUFF TOURN SGL QUICK 18X4 (TOURNIQUET CUFF) IMPLANT
CUFF TRNQT CYL 24X4X16.5-23 (TOURNIQUET CUFF) IMPLANT
DRAPE EXTREMITY T 121X128X90 (DISPOSABLE) ×2 IMPLANT
DRAPE SURG 17X23 STRL (DRAPES) ×2 IMPLANT
DURAPREP 26ML APPLICATOR (WOUND CARE) ×2 IMPLANT
GAUZE SPONGE 4X4 12PLY STRL (GAUZE/BANDAGES/DRESSINGS) ×2 IMPLANT
GAUZE STRETCH 2X75IN STRL (MISCELLANEOUS) ×2 IMPLANT
GAUZE XEROFORM 1X8 LF (GAUZE/BANDAGES/DRESSINGS) ×2 IMPLANT
GLOVE BIO SURGEON STRL SZ7.5 (GLOVE) ×4 IMPLANT
GLOVE BIOGEL PI IND STRL 8 (GLOVE) ×4 IMPLANT
GOWN STRL REUS W/ TWL LRG LVL3 (GOWN DISPOSABLE) ×2 IMPLANT
GOWN STRL REUS W/TWL XL LVL3 (GOWN DISPOSABLE) ×4 IMPLANT
NDL HYPO 25X1 1.5 SAFETY (NEEDLE) IMPLANT
NEEDLE HYPO 25X1 1.5 SAFETY (NEEDLE) ×2 IMPLANT
NS IRRIG 1000ML POUR BTL (IV SOLUTION) ×2 IMPLANT
PACK BASIN DAY SURGERY FS (CUSTOM PROCEDURE TRAY) ×2 IMPLANT
PAD CAST 3X4 CTTN HI CHSV (CAST SUPPLIES) ×2 IMPLANT
SLEEVE SCD COMPRESS KNEE MED (STOCKING) IMPLANT
STOCKINETTE 4X48 STRL (DRAPES) ×2 IMPLANT
SUT ETHILON 3 0 PS 1 (SUTURE) ×2 IMPLANT
SYR BULB EAR ULCER 3OZ GRN STR (SYRINGE) ×2 IMPLANT
SYR CONTROL 10ML LL (SYRINGE) IMPLANT
TOWEL GREEN STERILE FF (TOWEL DISPOSABLE) ×2 IMPLANT
UNDERPAD 30X36 HEAVY ABSORB (UNDERPADS AND DIAPERS) ×2 IMPLANT

## 2024-09-18 NOTE — Anesthesia Postprocedure Evaluation (Signed)
 Anesthesia Post Note  Patient: NOLA BOTKINS  Procedure(s) Performed: RELEASE, A1 PULLEY, FOR TRIGGER FINGER (Left: Hand)     Patient location during evaluation: PACU Anesthesia Type: MAC Level of consciousness: awake and alert Pain management: pain level controlled Vital Signs Assessment: post-procedure vital signs reviewed and stable Respiratory status: spontaneous breathing, nonlabored ventilation and respiratory function stable Cardiovascular status: blood pressure returned to baseline Postop Assessment: no apparent nausea or vomiting Anesthetic complications: no   No notable events documented.  Last Vitals:  Vitals:   09/18/24 1037 09/18/24 1045  BP: (!) 152/82 136/73  Pulse: 60 64  Resp: (!) 9 13  Temp: (!) 36.3 C   SpO2: 95% 95%    Last Pain:  Vitals:   09/18/24 1037  TempSrc:   PainSc: 0-No pain                 Vertell Row

## 2024-09-18 NOTE — Op Note (Signed)
 Operative Note  Date of operation: 09/18/2024 Preoperative diagnosis: Left middle finger trigger finger Postoperative diagnosis: Same  Procedure: Left middle finger A1 pulley release  Surgeon: Lonni GRADE. Vernetta, MD Assistant: Tory Gaskins, PA-C  Anesthesia: #1 mass ventilation and IV sedation, #2 local with plain lidocaine  and plain Marcaine  Tourniquet time: Less than 20 minutes EBL: Minimal Complications: None  Indications: The patient is a 89 year old female with active triggering involving her left middle finger.  She has tried and failed conservative treatment including several injections.  We have performed an A1 pulley release in the past on her thumb.  At this point she wishes to have this procedure on her left middle finger given the failure conservative treatment and the continued triggering and pain over the A1 pulley.  Procedure description: After informed consent was obtained the appropriate left hand middle finger was marked, the patient is brought to the operating room and placed upon the table with the left arm on arm table.  A forearm tourniquet was placed.  Her left hand was prepped and draped with DuraPrep and sterile drapes.  A timeout was called and she was identified as the correct patient and the correct left hand middle finger.  An Esmarch was then used to wrap out the hand and the tourniquet was inflated to 250 mm pressure.  We then anesthetized over the A1 pulley with 1% plain lidocaine .  We made incision over the A1 pulley and dissected down to the A1 pulley and was able to release it from distal to proximal and then we put the left middle finger through flexion extension and there was no triggering.  The soft tissue was then irrigated normal saline solution.  The skin was reapproximated with interrupted 3-0 nylon suture.  Xeroform well-padded sterile dressing was applied.  The tourniquet was let down and her fingers pink and nicely.  She was taken the recovery room.   Postoperatively she will be discharged from the recovery room to home with discharge and instructions for wound care and follow-up instructions.

## 2024-09-18 NOTE — Discharge Instructions (Addendum)
 Keep the dressing on your hand and clean and dry for the next 2 days. You can use your hand as comfort allows and you can move your fingers as comfort allows. In 2 days you can remove all the dressings and get your incision wet daily. After your dressings are removed, placed a Band-Aid over the incision daily to protect the sutures.   No Tylenol  before 3:45pm.   Post Anesthesia Home Care Instructions  Activity: Get plenty of rest for the remainder of the day. A responsible individual must stay with you for 24 hours following the procedure.  For the next 24 hours, DO NOT: -Drive a car -Advertising copywriter -Drink alcoholic beverages -Take any medication unless instructed by your physician -Make any legal decisions or sign important papers.  Meals: Start with liquid foods such as gelatin or soup. Progress to regular foods as tolerated. Avoid greasy, spicy, heavy foods. If nausea and/or vomiting occur, drink only clear liquids until the nausea and/or vomiting subsides. Call your physician if vomiting continues.  Special Instructions/Symptoms: Your throat may feel dry or sore from the anesthesia or the breathing tube placed in your throat during surgery. If this causes discomfort, gargle with warm salt water. The discomfort should disappear within 24 hours.

## 2024-09-18 NOTE — Transfer of Care (Signed)
 Immediate Anesthesia Transfer of Care Note  Patient: [name]  Procedure(s) Performed: RELEASE, A1 PULLEY, FOR TRIGGER FINGER (Left: Hand)  Patient Location: PACU  Anesthesia Type:General  Level of Consciousness: awake, alert , and oriented  Airway & Oxygen Therapy: Patient Spontanous Breathing and Patient connected to face mask oxygen  Post-op Assessment: Report given to RN and Post -op Vital signs reviewed and stable  Post vital signs: Reviewed and stable  Last Vitals:  Vitals Value Taken Time  BP 152/82 09/18/24 10:37  Temp    Pulse 60 09/18/24 10:41  Resp 16 09/18/24 10:41  SpO2 95 % 09/18/24 10:41  Vitals shown include unfiled device data.  Last Pain:  Vitals:   09/18/24 0933  TempSrc: Tympanic  PainSc: 0-No pain      Patients Stated Pain Goal: 6 (09/18/24 0933)  Complications: No notable events documented.

## 2024-09-18 NOTE — Progress Notes (Signed)
 Remote PPM Transmission

## 2024-09-18 NOTE — Anesthesia Procedure Notes (Signed)
 Procedure Name: MAC Date/Time: 09/18/2024 10:05 AM  Performed by: Buster Catheryn SAUNDERS, CRNAPre-anesthesia Checklist: Patient identified, Emergency Drugs available, Suction available, Patient being monitored and Timeout performed Patient Re-evaluated:Patient Re-evaluated prior to induction Oxygen Delivery Method: Simple face mask Placement Confirmation: positive ETCO2

## 2024-09-18 NOTE — Anesthesia Preprocedure Evaluation (Addendum)
 Anesthesia Evaluation  Patient identified by MRN, date of birth, ID band Patient awake    Reviewed: Allergy & Precautions, NPO status , Patient's Chart, lab work & pertinent test results, reviewed documented beta blocker date and time   History of Anesthesia Complications Negative for: history of anesthetic complications  Airway Mallampati: III  TM Distance: >3 FB Neck ROM: Full    Dental no notable dental hx.    Pulmonary neg pulmonary ROS   Pulmonary exam normal        Cardiovascular hypertension, Pt. on medications and Pt. on home beta blockers Normal cardiovascular exam+ dysrhythmias (on Eliquis ) Atrial Fibrillation + pacemaker      Neuro/Psych  Headaches  Anxiety Depression       GI/Hepatic Neg liver ROS,GERD  Medicated and Controlled,,  Endo/Other  negative endocrine ROS    Renal/GU Renal InsufficiencyRenal disease     Musculoskeletal  (+) Arthritis ,    Abdominal   Peds  Hematology negative hematology ROS (+)   Anesthesia Other Findings   Reproductive/Obstetrics                              Anesthesia Physical Anesthesia Plan  ASA: 2  Anesthesia Plan: MAC   Post-op Pain Management: Minimal or no pain anticipated   Induction:   PONV Risk Score and Plan: 2 and Treatment may vary due to age or medical condition, Propofol  infusion and Ondansetron   Airway Management Planned: Natural Airway and Simple Face Mask  Additional Equipment: None  Intra-op Plan:   Post-operative Plan:   Informed Consent: I have reviewed the patients History and Physical, chart, labs and discussed the procedure including the risks, benefits and alternatives for the proposed anesthesia with the patient or authorized representative who has indicated his/her understanding and acceptance.       Plan Discussed with: CRNA  Anesthesia Plan Comments:          Anesthesia Quick Evaluation

## 2024-09-18 NOTE — Interval H&P Note (Signed)
 History and Physical Interval Note: Patient is here today for a left middle finger A1 pulley release to treat with recurrent triggering of that linger.  There has been no acute or interval change in her medical status.  The risks and benefits of surgery have been discussed in detail and informed consent has been obtained.  The left operative level and hand have been marked.  09/18/2024 9:51 AM  [name]  has presented today for surgery, with the diagnosis of left long finger trigger finger.  The various methods of treatment have been discussed with the patient and family. After consideration of risks, benefits and other options for treatment, the patient has consented to  Procedure(s) with comments: RELEASE, A1 PULLEY, FOR TRIGGER FINGER (Left) - Left long finger trigger finger release as a surgical intervention.  The patient's history has been reviewed, patient examined, no change in status, stable for surgery.  I have reviewed the patient's chart and labs.  Questions were answered to the patient's satisfaction.     [name]

## 2024-09-19 ENCOUNTER — Encounter (HOSPITAL_BASED_OUTPATIENT_CLINIC_OR_DEPARTMENT_OTHER): Payer: Self-pay | Admitting: Orthopaedic Surgery

## 2024-09-21 ENCOUNTER — Ambulatory Visit: Payer: Self-pay | Admitting: Internal Medicine

## 2024-09-26 DIAGNOSIS — R519 Headache, unspecified: Secondary | ICD-10-CM | POA: Diagnosis not present

## 2024-09-26 DIAGNOSIS — H1131 Conjunctival hemorrhage, right eye: Secondary | ICD-10-CM | POA: Diagnosis not present

## 2024-10-02 ENCOUNTER — Encounter: Payer: Self-pay | Admitting: Orthopaedic Surgery

## 2024-10-02 ENCOUNTER — Ambulatory Visit (INDEPENDENT_AMBULATORY_CARE_PROVIDER_SITE_OTHER): Admitting: Orthopaedic Surgery

## 2024-10-02 DIAGNOSIS — M65332 Trigger finger, left middle finger: Secondary | ICD-10-CM

## 2024-10-02 NOTE — Progress Notes (Signed)
 [name] comes in today for first postoperative visit status post a middle finger A1 pulley release.  She says she is doing well.  The sutures been removed and she is able to flex and extend but there is some stiffness.  There is no triggering.  She said she felt one time like there was maybe some triggering but that has not been the case.  She does have a tennis ball at home and knows the things that she needs to do to try to get her hand less stiff and moving.  We will see her back in a month just to make sure that she does not need any type of therapy and is doing well.  She would like to see us  in a month as well.

## 2024-10-06 ENCOUNTER — Other Ambulatory Visit: Payer: Self-pay | Admitting: Nurse Practitioner

## 2024-10-27 ENCOUNTER — Encounter: Payer: Self-pay | Admitting: Radiology

## 2024-10-27 DIAGNOSIS — Z1231 Encounter for screening mammogram for malignant neoplasm of breast: Secondary | ICD-10-CM | POA: Diagnosis not present

## 2024-10-27 LAB — HM MAMMOGRAPHY

## 2024-10-28 ENCOUNTER — Ambulatory Visit (INDEPENDENT_AMBULATORY_CARE_PROVIDER_SITE_OTHER): Admitting: Adult Health

## 2024-10-28 ENCOUNTER — Encounter: Payer: Self-pay | Admitting: Adult Health

## 2024-10-28 VITALS — BP 132/78 | HR 67 | Temp 97.3°F | Ht 62.0 in | Wt 181.4 lb

## 2024-10-28 DIAGNOSIS — J019 Acute sinusitis, unspecified: Secondary | ICD-10-CM | POA: Diagnosis not present

## 2024-10-28 DIAGNOSIS — B9689 Other specified bacterial agents as the cause of diseases classified elsewhere: Secondary | ICD-10-CM

## 2024-10-28 DIAGNOSIS — K219 Gastro-esophageal reflux disease without esophagitis: Secondary | ICD-10-CM | POA: Diagnosis not present

## 2024-10-28 DIAGNOSIS — I480 Paroxysmal atrial fibrillation: Secondary | ICD-10-CM | POA: Diagnosis not present

## 2024-10-28 DIAGNOSIS — I10 Essential (primary) hypertension: Secondary | ICD-10-CM

## 2024-10-28 MED ORDER — AMOXICILLIN 875 MG PO TABS: 875.0000 mg | ORAL_TABLET | Freq: Two times a day (BID) | ORAL | 0 refills | Status: AC

## 2024-10-28 NOTE — Progress Notes (Signed)
 [facility] clinic  Provider:  Jereld Serum DNP  Code Status:  Full Code  Goals of Care:     09/18/2024    9:26 AM  Advanced Directives  Does Patient Have a Medical Advance Directive? Yes  Does patient want to make changes to medical advance directive? No - Patient declined     Chief Complaint  Patient presents with   Allergy    Patient states that she has taken a lot of OTC medication and she believes that its causing her to feel lousy with an upset stomach. OTC: Allegra, Claritin, Ayr, Vicks amd Asteopro   MUCUS    Discussed the use of AI scribe software for clinical note transcription with the patient, who gave verbal consent to proceed.  HPI: Patient is a 89 y.o. female seen today for an acute visit for seasonal allergy symptoms.  She has been experiencing nasal congestion and hoarseness for over two weeks. Her nose feels 'so stocked up,' and her voice is worsening. She describes a clogging sensation from the back of her nose to her throat, which bothers her stomach. She has tried various over-the-counter medications without relief.  She denies fever but experiences nausea, which she attributes to taking too much over-the-counter medication. She has been using nasal spray and mentions that excessive use may be affecting her stomach. The mucus is described as 'grayish clear,' and she frequently blows her nose.  She has a history of allergies, although this is the first time she has experienced such severe symptoms. She has been using Claritin and Mucinex, which initially helped but are no longer effective. She also uses a saline nasal spray to alleviate dryness.  Her medical history includes hypertension and atrial fibrillation. Her current medications are valsartan  320/25 mg once daily and metoprolol  tartrate 25 mg twice daily for hypertension.  She takes apixaban  5 mg twice daily and Metoprolol  tartrate for atrial fibrillation.     Past Medical History:  Diagnosis  Date   A-fib [facility])    Allergic rhinitis due to pollen    Arthritis    right knee (06/21/2016)   Asymptomatic varicose veins    Carpal tunnel syndrome    Chronic lower back pain    on the left side (06/21/2016)   Constipation    Depressive disorder, not elsewhere classified    pt denies   Diaphragmatic hernia without mention of obstruction or gangrene    Diverticulosis of colon (without mention of hemorrhage)    Enthesopathy of hip region    Esophageal reflux    , just occasional   History of blood transfusion    w/both knee replacements   History of duodenal ulcer    History of kidney stones    passed   Insomnia, unspecified    Migraine    none since ~ 1990 (06/21/2016)   Myalgia and myositis, unspecified    Obesity, unspecified    Pain in joint, ankle and foot    Pain in joint, hand    Pain in joint, lower leg    Pain in joint, pelvic region and thigh    Plantar fascial fibromatosis    Presence of permanent cardiac pacemaker    -St Jude   Reflux esophagitis    Sciatica    Spinal stenosis, unspecified region other than cervical    Stricture and stenosis of esophagus    Unspecified essential hypertension    Unspecified vitamin D  deficiency     Past Surgical History:  Procedure Laterality Date  BREAST BIOPSY Left 1990s X 2   CARDIOVERSION N/A 04/26/2016   Procedure: CARDIOVERSION;  Surgeon: Vinie JAYSON Maxcy, MD;  Location: [facility] ENDOSCOPY;  Service: Cardiovascular;  Laterality: N/A;   CARDIOVERSION N/A 12/04/2022   Procedure: CARDIOVERSION;  Surgeon: Loni Soyla LABOR, MD;  Location: [facility] ENDOSCOPY;  Service: Cardiovascular;  Laterality: N/A;   CATARACT EXTRACTION W/ INTRAOCULAR LENS IMPLANT Left 08/03/1999   DR EPES    CATARACT EXTRACTION W/ INTRAOCULAR LENS IMPLANT Right 2000   DR EPES   CHOLECYSTECTOMY OPEN  1989   DR BOWMAN   COLONOSCOPY  1988   Normal    DENTAL SURGERY Left 08/2016   EP IMPLANTABLE DEVICE N/A 06/21/2016   Procedure: Pacemaker Implant;   Surgeon: Danelle LELON Birmingham, MD;  Location: [facility] INVASIVE CV LAB;  Service: Cardiovascular;  Laterality: N/A;   ESOPHAGOGASTRODUODENOSCOPY (EGD) WITH ESOPHAGEAL DILATION  ~ 1982   Dr. Jakie   EXCISION OF ACTINIC KERATOSIS     DR LUPTON    INSERT / REPLACE / REMOVE PACEMAKER  06/21/2016   St Jude   KNEE ARTHROSCOPY Left 2003   KNEE ARTHROSCOPY Right 06/26/2013   KNEE CLOSED REDUCTION Right 10/23/2013   Procedure: CLOSED MANIPULATION RIGHT KNEE;  Surgeon: Lonni CINDERELLA Poli, MD;  Location: [facility] OR;  Service: Orthopedics;  Laterality: Right;   LASER FOR CLOUDY CAP LEFT EYE Left 03/2006   DR DIGBY   LUMBAR LAMINECTOMY/DECOMPRESSION MICRODISCECTOMY N/A 06/12/2019   Procedure: Laminectomy and Foraminotomy - Lumbar four-Lumbar five;  Surgeon: Joshua Alm RAMAN, MD;  Location: [facility] OR;  Service: Neurosurgery;  Laterality: N/A;   LUMBAR LAMINECTOMY/DECOMPRESSION MICRODISCECTOMY Bilateral 07/16/2023   Procedure: Right- - Lumbar three-Lumbar four - Lumbar four-Lumbar five Decompressive laminectomy;  Surgeon: Joshua Alm Hamilton, MD;  Location: [facility] OR;  Service: Neurosurgery;  Laterality: Bilateral;   SKIN SURGERY  04/2022   [facility], Dr.Gray. Removed area on upper chest   TOTAL KNEE ARTHROPLASTY Left 04/2004   DR RENDALL   TOTAL KNEE ARTHROPLASTY Right 07/04/2013   Procedure: RIGHT TOTAL KNEE ARTHROPLASTY;  Surgeon: Lonni CINDERELLA Poli, MD;  Location: [facility] ORS;  Service: Orthopedics;  Laterality: Right;   TRIGGER FINGER RELEASE Right 09/2018   TRIGGER FINGER RELEASE Left 09/18/2024   Procedure: RELEASE, A1 PULLEY, FOR TRIGGER FINGER;  Surgeon: Poli Lonni CINDERELLA, MD;  Location: [facility];  Service: Orthopedics;  Laterality: Left;  Left long finger trigger finger release   VAGINAL HYSTERECTOMY  1979    Allergies  Allergen Reactions   Advil [Ibuprofen] Other (See Comments)    GI upset   Aleve [Naproxen Sodium] Other (See Comments)    GI upset   Aspirin  Other (See  Comments)    Heartburn/indigestion.   Atorvastatin  Other (See Comments)    Leg cramps   Brimonidine     Irritated left eye, headaches, burning, itching and foreign object sensation    Celebrex [Celecoxib] Other (See Comments)    GI upset   Diclofenac     GI upset   Doxycycline  Other (See Comments)    Headache   Nsaids Other (See Comments)    Tears my stomach up    Other     Antihistamine - increase BP   Pravastatin  Other (See Comments)    Leg cramps   Timolol Other (See Comments)    Dropped HR   Metoclopramide  Hcl Palpitations    Outpatient Encounter Medications as of 10/28/2024  Medication Sig   acetaminophen  (TYLENOL ) 500 MG tablet Take 500-1,000 mg by mouth every 6 (  six) hours as needed for moderate pain.   antiseptic oral rinse (BIOTENE) LIQD 15 mLs by Mouth Rinse route 2 (two) times daily.   apixaban  (ELIQUIS ) 5 MG TABS tablet Take 1 tablet (5 mg total) by mouth 2 (two) times daily.   Artificial Saliva (ACT DRY MOUTH) LOZG Use as directed 1 drop in the mouth or throat as needed (dry mouth).   bimatoprost  (LUMIGAN ) 0.01 % SOLN Place 1 drop into both eyes at bedtime.    Calcium  Citrate-Vitamin D  (CALCIUM  + D PO) Take 1 tablet by mouth in the morning.   Cholecalciferol  (VITAMIN D3 SUPER STRENGTH) 50 MCG (2000 UT) TABS Take 2,000 Units by mouth daily.   cyanocobalamin (VITAMIN B12) 1000 MCG tablet Take 1,000 mcg by mouth in the morning.   Dentifrices (BIOTENE DRY MOUTH GENTLE) PSTE Place 1 Application onto teeth in the morning and at bedtime.   Docusate Calcium  (STOOL SOFTENER PO) Take 100 mg by mouth 3 (three) times a week. At night   dorzolamide  (TRUSOPT ) 2 % ophthalmic solution Place 1 drop into both eyes 3 (three) times daily.   esomeprazole  (NEXIUM ) 20 MG capsule TAKE 1 CAPSULE (20 MG TOTAL) BY MOUTH AS NEEDED   ezetimibe  (ZETIA ) 10 MG tablet TAKE ONE TABLET BY MOUTH ONCE DAILY   Fexofenadine HCl (ALLEGRA ALLERGY PO) Take by mouth.   hydroquinone  4 % cream Apply topically  daily.   loperamide  (IMODIUM  A-D) 2 MG tablet Take 1 tablet (2 mg total) by mouth 3 (three) times daily as needed for diarrhea or loose stools.   Magnesium  Oxide (MAGNESIUM  EXTRA STRENGTH PO) Apply 1 Application topically as needed (knee pain.). MAGNESIUM  OIL   methylcellulose (ARTIFICIAL TEARS) 1 % ophthalmic solution Place 1 drop into both eyes 3 (three) times daily as needed (for dry eyes).   metoprolol  tartrate (LOPRESSOR ) 25 MG tablet Take 1 tablet (25 mg total) by mouth 2 (two) times daily.   propafenone  (RYTHMOL  SR) 225 MG 12 hr capsule TAKE 1 CAPSULE BY MOUTH TWICE A DAY   sodium chloride  (OCEAN) 0.65 % SOLN nasal spray Place 1 spray into both nostrils 2 (two) times daily as needed (dry nasal passages). [employer] Saline Nasal Moisturizing Spray   traMADol  (ULTRAM ) 50 MG tablet Take 1 tablet (50 mg total) by mouth every 6 (six) hours as needed.   traZODone  (DESYREL ) 50 MG tablet TAKE ONE-HALF TO 1 TABLET BY MOUTH AT BEDTIME AS NEEDED   valsartan -hydrochlorothiazide  (DIOVAN -HCT) 320-25 MG tablet TAKE ONE TABLET BY MOUTH DAILY   No facility-administered encounter medications on file as of 10/28/2024.    Review of Systems:  Review of Systems  Constitutional:  Negative for appetite change, chills, fatigue and fever.  HENT:  Positive for postnasal drip, sinus pressure, sinus pain, sore throat and voice change. Negative for congestion, hearing loss and rhinorrhea.   Eyes: Negative.   Respiratory:  Positive for cough. Negative for shortness of breath and wheezing.   Cardiovascular:  Negative for chest pain, palpitations and leg swelling.  Gastrointestinal:  Negative for abdominal pain, constipation, diarrhea, nausea and vomiting.  Genitourinary:  Negative for dysuria.  Musculoskeletal:  Negative for arthralgias, back pain and myalgias.  Skin:  Negative for color change, rash and wound.  Neurological:  Negative for dizziness, weakness and headaches.  Psychiatric/Behavioral:  Negative for behavioral  problems. The patient is not nervous/anxious.     Health Maintenance  Topic Date Due   Influenza Vaccine  07/25/2024   COVID-19 Vaccine (4 - 2025-26 season) 08/25/2024  Mammogram  10/21/2024   Medicare Annual Wellness (AWV)  02/11/2025   DTaP/Tdap/Td (2 - Td or Tdap) 06/25/2027   Pneumococcal Vaccine: 50+ Years  Completed   DEXA SCAN  Completed   Zoster Vaccines- Shingrix  Completed   Meningococcal B Vaccine  Aged Out    Physical Exam: Vitals:   10/28/24 1422  Weight: 181 lb 6.4 oz (82.3 kg)  Height: 5' 2 (1.575 m)   Body mass index is 33.18 kg/m. Physical Exam Constitutional:      General: She is not in acute distress.    Appearance: She is obese.  HENT:     Head: Normocephalic and atraumatic.     Nose: Nose normal.     Mouth/Throat:     Mouth: Mucous membranes are moist.  Eyes:     Conjunctiva/sclera: Conjunctivae normal.  Cardiovascular:     Rate and Rhythm: Normal rate and regular rhythm.  Pulmonary:     Effort: Pulmonary effort is normal.     Breath sounds: Normal breath sounds.  Abdominal:     General: Bowel sounds are normal.     Palpations: Abdomen is soft.  Musculoskeletal:        General: Normal range of motion.     Cervical back: Normal range of motion.  Skin:    General: Skin is warm and dry.  Neurological:     General: No focal deficit present.     Mental Status: She is alert and oriented to person, place, and time.  Psychiatric:        Mood and Affect: Mood normal.        Behavior: Behavior normal.        Thought Content: Thought content normal.        Judgment: Judgment normal.     Labs reviewed: Basic Metabolic Panel: Recent Labs    02/18/24 1611 06/20/24 1117 09/15/24 1258  NA 141 138 139  K 3.6 3.7 4.3  CL 104 102 103  CO2 28 29 27   GLUCOSE 97 94 99  BUN 21 14 10   CREATININE 0.96* 0.71 0.79  CALCIUM  9.2 9.5 9.0  TSH 1.93  --   --    Liver Function Tests: Recent Labs    02/18/24 1611  AST 20  ALT 17  BILITOT 0.4   PROT 6.9   No results for input(s): LIPASE, AMYLASE in the last 8760 hours. No results for input(s): AMMONIA in the last 8760 hours. CBC: Recent Labs    02/18/24 1611 06/20/24 1117  WBC 6.5 5.9  NEUTROABS 3,205 3,157  HGB 12.8 12.4  HCT 38.6 38.3  MCV 91.5 92.1  PLT 190 180   Lipid Panel: Recent Labs    02/18/24 1611  CHOL 183  HDL 36*  LDLCALC 105*  TRIG 291*  CHOLHDL 5.1*   Lab Results  Component Value Date   HGBA1C 5.2 08/06/2017    Procedures since last visit: No results found.  Assessment/Plan  1. Acute bacterial rhinosinusitis (Primary) -  Symptoms persisting over two weeks with likely bacterial etiology. - Prescribed amoxicillin  500 mg twice daily for 7 days. - Advised discontinuation of over-the-counter medications. - Recommended use of a humidifier. - Encouraged increased fluid intake. - amoxicillin  (AMOXIL ) 875 MG tablet; Take 1 tablet (875 mg total) by mouth 2 (two) times daily for 7 days.  Dispense: 14 tablet; Refill: 0  2. Essential hypertension -  Blood pressure well-controlled with current medication regimen. - Continue valsartan  and metoprolol  as prescribed.  3. Paroxysmal  atrial fibrillation (HCC) -  Well-controlled with metoprolol  and apixaban . - Continue metoprolol  tartrate 25 mg BID -   continue  apixaban  5 mg BID  4. Gastroesophageal reflux disease, unspecified whether esophagitis present -  Nausea and stomach discomfort likely due to excessive over-the-counter medication use. - Advised discontinuation of over-the-counter medications.     Labs/tests ordered:    None   Return if symptoms worsen or fail to improve.  Yang Rack Medina-Vargas, NP

## 2024-10-29 ENCOUNTER — Encounter: Payer: Self-pay | Admitting: Nurse Practitioner

## 2024-11-05 ENCOUNTER — Ambulatory Visit (INDEPENDENT_AMBULATORY_CARE_PROVIDER_SITE_OTHER): Admitting: Orthopaedic Surgery

## 2024-11-05 ENCOUNTER — Encounter: Payer: Self-pay | Admitting: Orthopaedic Surgery

## 2024-11-05 DIAGNOSIS — M65332 Trigger finger, left middle finger: Secondary | ICD-10-CM

## 2024-11-05 NOTE — Progress Notes (Signed)
 [name] is now 6-week status post A1 pulley release of her left middle finger.  She says is a little bit stiff and tight but overall is doing well.  On exam she does have some scar tissue over the A1 pulley but there is no triggering.  She has almost full flexion and extension of her hand.  She did have hand therapy after her right trigger thumb but feels like she does not need it on this side since she knows really what to do.  I have recommended Voltaren gel for the inflammation over the surgical site but also just time and massaging this area.  All questions and concerns were answered and addressed.  Follow-up now can be as needed.

## 2024-11-09 ENCOUNTER — Other Ambulatory Visit: Payer: Self-pay | Admitting: Cardiology

## 2024-11-09 DIAGNOSIS — I480 Paroxysmal atrial fibrillation: Secondary | ICD-10-CM

## 2024-11-10 NOTE — Telephone Encounter (Signed)
 Prescription refill request for Eliquis  received. Indication: a fib Last office visit: 07/01/24 Scr: 0.79 epic 09/15/24 Age: 89 Weight: 82kg

## 2024-11-17 ENCOUNTER — Ambulatory Visit: Admitting: Physician Assistant

## 2024-11-17 ENCOUNTER — Other Ambulatory Visit: Payer: Self-pay

## 2024-11-17 DIAGNOSIS — M25562 Pain in left knee: Secondary | ICD-10-CM

## 2024-11-17 NOTE — Progress Notes (Signed)
 HPI [name] comes in today due to left knee pain.  She states 12 days ago she bumped her knee on her desk and hit it hard on the medial aspect.  This knee that she had replaced years ago.  She woke up a few days later and was unable to stand due to the pain in the posterior aspect of the knee.  She relates that the knee pain however is improving.  She still has a knot on the medial aspect of the knee.  She initially was using a cane to ambulate but is no longer using this to ambulate outside the home.  She has been taking Tylenol  for pain.  She feels the knee is weak.  She is on chronic Eliquis .  Review of systems: See HPI otherwise negative  Physical exam: General well-developed well-nourished female who ambulates without any assistive device with an antalgic gait. Bilateral knees: Good range of motion of both knees.  No instability valgus varus stressing bilaterally.  Slight edema but no ecchymosis medial aspect of the left knee only.  Well-healed surgical incisions.  Anterior drawer left knee is negative.  No abnormal warmth erythema or effusion of either knee.  She is able to do a full leg extension on the left.  Has some minimal tenderness posterior aspect of the left knee and medial aspect of the left knee. Bilateral hips: Good range of motion of both hips.  Tenderness over the left hip trochanteric region. Straight leg raise is negative bilaterally.  Radiographs: 2 views left knee: Knee is well located.  Status post left total knee arthroplasty well-seated components.  Arthrosclerosis of the popliteal vessel.  No acute fractures acute findings.  Impression: Left knee pain/contusion   Plan: Due to the fact that she is gradually improving the fact that she is on Eliquis  for contusion.  Reviewed the radiographs with the patient.  Gave her reassurance that her knee feels stable on clinically exam.  She will follow-up with us  if pain persist or becomes worse.  Questions were encouraged and  answered at length.

## 2024-12-01 ENCOUNTER — Ambulatory Visit: Admitting: Physician Assistant

## 2024-12-04 DIAGNOSIS — H401131 Primary open-angle glaucoma, bilateral, mild stage: Secondary | ICD-10-CM | POA: Diagnosis not present

## 2024-12-04 DIAGNOSIS — H0102A Squamous blepharitis right eye, upper and lower eyelids: Secondary | ICD-10-CM | POA: Diagnosis not present

## 2024-12-04 DIAGNOSIS — H0102B Squamous blepharitis left eye, upper and lower eyelids: Secondary | ICD-10-CM | POA: Diagnosis not present

## 2024-12-04 DIAGNOSIS — H16223 Keratoconjunctivitis sicca, not specified as Sjogren's, bilateral: Secondary | ICD-10-CM | POA: Diagnosis not present

## 2024-12-16 ENCOUNTER — Ambulatory Visit: Payer: Medicare Other

## 2024-12-16 DIAGNOSIS — I495 Sick sinus syndrome: Secondary | ICD-10-CM | POA: Diagnosis not present

## 2024-12-16 LAB — CUP PACEART REMOTE DEVICE CHECK
Battery Remaining Longevity: 19 mo
Battery Remaining Percentage: 19 %
Battery Voltage: 2.86 V
Brady Statistic AP VP Percent: 12 %
Brady Statistic AP VS Percent: 88 %
Brady Statistic AS VP Percent: 1 %
Brady Statistic AS VS Percent: 1 %
Brady Statistic RA Percent Paced: 99 %
Brady Statistic RV Percent Paced: 12 %
Date Time Interrogation Session: 20251223020016
Implantable Lead Connection Status: 753985
Implantable Lead Connection Status: 753985
Implantable Lead Implant Date: 20170628
Implantable Lead Implant Date: 20170628
Implantable Lead Location: 753859
Implantable Lead Location: 753860
Implantable Pulse Generator Implant Date: 20170628
Lead Channel Impedance Value: 480 Ohm
Lead Channel Impedance Value: 560 Ohm
Lead Channel Pacing Threshold Amplitude: 0.75 V
Lead Channel Pacing Threshold Amplitude: 1.5 V
Lead Channel Pacing Threshold Pulse Width: 0.5 ms
Lead Channel Pacing Threshold Pulse Width: 0.5 ms
Lead Channel Sensing Intrinsic Amplitude: 1.1 mV
Lead Channel Sensing Intrinsic Amplitude: 11.3 mV
Lead Channel Setting Pacing Amplitude: 2 V
Lead Channel Setting Pacing Amplitude: 3 V
Lead Channel Setting Pacing Pulse Width: 0.5 ms
Lead Channel Setting Sensing Sensitivity: 2 mV
Pulse Gen Model: 2272
Pulse Gen Serial Number: 7910313

## 2024-12-17 NOTE — Progress Notes (Signed)
 Remote PPM Transmission

## 2024-12-24 ENCOUNTER — Ambulatory Visit: Payer: Self-pay | Admitting: Cardiovascular Disease

## 2025-01-06 NOTE — Progress Notes (Unsigned)
 "    Cardiology Office Note   Date:  01/09/2025   ID:  [name], DOB [birth year], MRN [id number]  PCP:  [name], NP  Cardiologist:   Lieutenant Abarca, MD   Chief Complaint  Patient presents with   Atrial Fibrillation     History of Present Illness: [name] is a 89 y.o. female is seen for follow up Afib and dyspnea. She also needs clearance for lumbar laminectomy with Dr Alm Molt. She has a long history of marked sinus bradycardia and atrial fibrillation/flutter. She was seen initially in 2014. Echo at that time showed mild LAE otherwise normal. Holter showed mean HR 59 with lowest HR 43 and peak HR 109.    On March 9,2017 she noted an increased HR by BP monitor up to 153. At this time she felt marked indigestion from her waist to her neck. She felt her heart fluttering.   She was found to be in Afib with RVR. She was started on Eliquis  and metoprolol . Myoview study and Echo were normal.   She later had attempt at DCCV. She was in an atypical atrial flutter at that time. DCCV resulted in very prolonged pauses > 6 seconds and return to flutter. She was seen by Dr. Inocencio and placed on flecainide . This did convert her to NSR but made her feel very sick with nausea, dizziness, and extreme fatigue. Flecainide  was discontinued.. She continued to have marked bradycardia and underwent PPM placement on 06/21/16. When seen in September 2018 by Dr. Waddell she had an Afib burden of 94%. She was started on propafenone  for her Afib. Initially this medication caused her to have more nausea but this has improved.  On her subsequent  checks her Afib burden was less than 1% since December 2019.   She was seen by Dr Waddell on 11/17. By pacer check she was in Afib continually. Rate controlled. On follow up she noted some increased SOB and fatigue over the past month or two. Really is not aware of palpitations. No edema. No chest pain. She did undergo DCCV on 12/04/22. On follow up pacer check  on Dec 28 she was maintaining NSR. Was seen in Feb by EP and still doing well.   On follow up she is doing well.  No chest pain. No Afib. No edema or SOB. Does tire more easily now.     Past Medical History:  Diagnosis Date   A-fib [facility])    Allergic rhinitis due to pollen    Arthritis    right knee (06/21/2016)   Asymptomatic varicose veins    Carpal tunnel syndrome    Chronic lower back pain    on the left side (06/21/2016)   Constipation    Depressive disorder, not elsewhere classified    pt denies   Diaphragmatic hernia without mention of obstruction or gangrene    Diverticulosis of colon (without mention of hemorrhage)    Enthesopathy of hip region    Esophageal reflux    , just occasional   History of blood transfusion    w/both knee replacements   History of duodenal ulcer    History of kidney stones    passed   Insomnia, unspecified    Migraine    none since ~ 1990 (06/21/2016)   Myalgia and myositis, unspecified    Obesity, unspecified    Pain in joint, ankle and foot    Pain in joint, hand    Pain in joint, lower  leg    Pain in joint, pelvic region and thigh    Plantar fascial fibromatosis    Presence of permanent cardiac pacemaker    -[employer]   Reflux esophagitis    Sciatica    Spinal stenosis, unspecified region other than cervical    Stricture and stenosis of esophagus    Unspecified essential hypertension    Unspecified vitamin D  deficiency     Past Surgical History:  Procedure Laterality Date   BREAST BIOPSY Left 1990s X 2   CARDIOVERSION N/A 04/26/2016   Procedure: CARDIOVERSION;  Surgeon: Vinie JAYSON Maxcy, MD;  Location: [facility] ENDOSCOPY;  Service: Cardiovascular;  Laterality: N/A;   CARDIOVERSION N/A 12/04/2022   Procedure: CARDIOVERSION;  Surgeon: Loni Soyla LABOR, MD;  Location: [facility] ENDOSCOPY;  Service: Cardiovascular;  Laterality: N/A;   CATARACT EXTRACTION W/ INTRAOCULAR LENS IMPLANT Left 08/03/1999   DR EPES    CATARACT EXTRACTION W/  INTRAOCULAR LENS IMPLANT Right 2000   DR EPES   CHOLECYSTECTOMY OPEN  1989   DR BOWMAN   COLONOSCOPY  1988   Normal    DENTAL SURGERY Left 08/2016   EP IMPLANTABLE DEVICE N/A 06/21/2016   Procedure: Pacemaker Implant;  Surgeon: Danelle LELON Birmingham, MD;  Location: [facility] INVASIVE CV LAB;  Service: Cardiovascular;  Laterality: N/A;   ESOPHAGOGASTRODUODENOSCOPY (EGD) WITH ESOPHAGEAL DILATION  ~ 1982   Dr. Jakie   EXCISION OF ACTINIC KERATOSIS     DR LUPTON    INSERT / REPLACE / REMOVE PACEMAKER  06/21/2016   [employer]   KNEE ARTHROSCOPY Left 2003   KNEE ARTHROSCOPY Right 06/26/2013   KNEE CLOSED REDUCTION Right 10/23/2013   Procedure: CLOSED MANIPULATION RIGHT KNEE;  Surgeon: Lonni CINDERELLA Poli, MD;  Location: [facility] OR;  Service: Orthopedics;  Laterality: Right;   LASER FOR CLOUDY CAP LEFT EYE Left 03/2006   DR DIGBY   LUMBAR LAMINECTOMY/DECOMPRESSION MICRODISCECTOMY N/A 06/12/2019   Procedure: Laminectomy and Foraminotomy - Lumbar four-Lumbar five;  Surgeon: Joshua Alm RAMAN, MD;  Location: [facility] OR;  Service: Neurosurgery;  Laterality: N/A;   LUMBAR LAMINECTOMY/DECOMPRESSION MICRODISCECTOMY Bilateral 07/16/2023   Procedure: Right- - Lumbar three-Lumbar four - Lumbar four-Lumbar five Decompressive laminectomy;  Surgeon: Joshua Alm Hamilton, MD;  Location: [facility] OR;  Service: Neurosurgery;  Laterality: Bilateral;   SKIN SURGERY  04/2022   [facility], Dr.Gray. Removed area on upper chest   TOTAL KNEE ARTHROPLASTY Left 04/2004   DR RENDALL   TOTAL KNEE ARTHROPLASTY Right 07/04/2013   Procedure: RIGHT TOTAL KNEE ARTHROPLASTY;  Surgeon: Lonni CINDERELLA Poli, MD;  Location: [facility] ORS;  Service: Orthopedics;  Laterality: Right;   TRIGGER FINGER RELEASE Right 09/2018   TRIGGER FINGER RELEASE Left 09/18/2024   Procedure: RELEASE, A1 PULLEY, FOR TRIGGER FINGER;  Surgeon: Poli Lonni CINDERELLA, MD;  Location: [facility];  Service: Orthopedics;  Laterality: Left;  Left long finger trigger  finger release   VAGINAL HYSTERECTOMY  1979     Current Outpatient Medications  Medication Sig Dispense Refill   acetaminophen  (TYLENOL ) 500 MG tablet Take 500-1,000 mg by mouth every 6 (six) hours as needed for moderate pain.     antiseptic oral rinse (BIOTENE) LIQD 15 mLs by Mouth Rinse route 2 (two) times daily.     apixaban  (ELIQUIS ) 5 MG TABS tablet TAKE 1 TABLET BY MOUTH TWICE A DAY 180 tablet 1   Artificial Saliva (ACT DRY MOUTH) LOZG Use as directed 1 drop in the mouth or throat as needed (dry mouth).  bimatoprost  (LUMIGAN ) 0.01 % SOLN Place 1 drop into both eyes at bedtime.      Calcium  Citrate-Vitamin D  (CALCIUM  + D PO) Take 1 tablet by mouth in the morning.     Cholecalciferol  (VITAMIN D3 SUPER STRENGTH) 50 MCG (2000 UT) TABS Take 2,000 Units by mouth daily.     cyanocobalamin (VITAMIN B12) 1000 MCG tablet Take 1,000 mcg by mouth in the morning.     Dentifrices (BIOTENE DRY MOUTH GENTLE) PSTE Place 1 Application onto teeth in the morning and at bedtime.     Docusate Calcium  (STOOL SOFTENER PO) Take 100 mg by mouth 3 (three) times a week. At night     dorzolamide  (TRUSOPT ) 2 % ophthalmic solution Place 1 drop into both eyes 3 (three) times daily.     esomeprazole  (NEXIUM ) 20 MG capsule TAKE 1 CAPSULE (20 MG TOTAL) BY MOUTH AS NEEDED 90 capsule 1   ezetimibe  (ZETIA ) 10 MG tablet TAKE ONE TABLET BY MOUTH ONCE DAILY 90 tablet 3   Fexofenadine HCl (ALLEGRA ALLERGY PO) Take by mouth.     hydroquinone  4 % cream Apply topically daily. 28.35 g 1   Magnesium  Oxide (MAGNESIUM  EXTRA STRENGTH PO) Apply 1 Application topically as needed (knee pain.). MAGNESIUM  OIL     methylcellulose (ARTIFICIAL TEARS) 1 % ophthalmic solution Place 1 drop into both eyes 3 (three) times daily as needed (for dry eyes).     metoprolol  tartrate (LOPRESSOR ) 25 MG tablet Take 1 tablet (25 mg total) by mouth 2 (two) times daily. 180 tablet 3   propafenone  (RYTHMOL  SR) 225 MG 12 hr capsule TAKE 1 CAPSULE BY MOUTH  TWICE A DAY 180 capsule 3   sodium chloride  (OCEAN) 0.65 % SOLN nasal spray Place 1 spray into both nostrils 2 (two) times daily as needed (dry nasal passages). [employer] Saline Nasal Moisturizing Spray     traMADol  (ULTRAM ) 50 MG tablet Take 1 tablet (50 mg total) by mouth every 6 (six) hours as needed. 20 tablet 0   traZODone  (DESYREL ) 50 MG tablet TAKE ONE-HALF TO 1 TABLET BY MOUTH AT BEDTIME AS NEEDED 90 tablet 1   valsartan -hydrochlorothiazide  (DIOVAN -HCT) 320-25 MG tablet TAKE ONE TABLET BY MOUTH DAILY 90 tablet 3   No current facility-administered medications for this visit.    Allergies:   Advil [ibuprofen], Aleve [naproxen sodium], Aspirin , Atorvastatin , Brimonidine, Celebrex [celecoxib], Diclofenac, Doxycycline , Nsaids, Other, Pravastatin , Timolol, Timolol maleate, and Metoclopramide  hcl    Social History:  The patient  reports that she has never smoked. She has never used smokeless tobacco. She reports that she does not drink alcohol  and does not use drugs.   Family History:  The patient's family history includes Cerebrovascular Accident in her mother; Heart disease in her father; Hypertension in her brother; Obesity in her daughter; Ovarian cancer in her mother; Uterine cancer in her mother.    ROS:  Please see the history of present illness.   Otherwise, review of systems are positive for none.   All other systems are reviewed and negative.    PHYSICAL EXAM: VS:  BP (!) 140/80   Pulse 66   Ht 5' 3 (1.6 m)   Wt 177 lb 9.6 oz (80.6 kg)   SpO2 97%   BMI 31.46 kg/m  , BMI Body mass index is 31.46 kg/m. GENERAL:  Well appearing, obese WF in NAD HEENT:  PERRL, EOMI, sclera are clear. Oropharynx is clear. NECK:  No jugular venous distention, carotid upstroke brisk and symmetric, no bruits, no thyromegaly  or adenopathy LUNGS:  clear  CHEST:  Unremarkable HEART:  IRRR,  PMI not displaced or sustained,S1 and S2 within normal limits, no S3, no S4: no clicks, no rubs, no murmurs ABD:   Soft, nontender. BS +, no masses or bruits. No hepatomegaly, no splenomegaly EXT:  2 + pulses throughout, no edema, no cyanosis no clubbing SKIN:  Warm and dry.  No rashes NEURO:  Alert and oriented x 3. Cranial nerves II through XII intact. PSYCH:  Cognitively intact        Recent Labs: 02/18/2024: ALT 17; TSH 1.93 06/20/2024: Hemoglobin 12.4; Platelets 180 09/15/2024: BUN 10; Creatinine, Ser 0.79; Potassium 4.3; Sodium 139    Lipid Panel    Component Value Date/Time   CHOL 183 02/18/2024 1611   CHOL 179 03/27/2016 0853   TRIG 291 (H) 02/18/2024 1611   HDL 36 (L) 02/18/2024 1611   HDL 42 03/27/2016 0853   CHOLHDL 5.1 (H) 02/18/2024 1611   VLDL 42 (H) 08/06/2017 0918   LDLCALC 105 (H) 02/18/2024 1611      Wt Readings from Last 3 Encounters:  01/09/25 177 lb 9.6 oz (80.6 kg)  10/28/24 181 lb 6.4 oz (82.3 kg)  09/18/24 180 lb 12.4 oz (82 kg)      Other studies Reviewed: Additional studies/ records that were reviewed today include:  Echo: 09/06/17: Study Conclusions   - Left ventricle: The cavity size was normal. Wall thickness was   increased in a pattern of mild LVH. Systolic function was normal.   The estimated ejection fraction was in the range of 55% to 60%.   Doppler parameters are consistent with both elevated ventricular   end-diastolic filling pressure and elevated left atrial filling   pressure. - Left atrium: The atrium was moderately dilated. - Atrial septum: There was increased thickness of the septum,   consistent with lipomatous hypertrophy. No defect or patent   foramen ovale was identified  Echo 10/7//21: IMPRESSIONS     1. Left ventricular ejection fraction, by estimation, is 55 to 60%. The  left ventricle has normal function. The left ventricle has no regional  wall motion abnormalities. There is mild left ventricular hypertrophy.  Left ventricular diastolic parameters  are consistent with Grade I diastolic dysfunction (impaired relaxation).   The E/e' is 11.   2. Right ventricular systolic function is normal. The right ventricular  size is normal.   3. Left atrial size was moderately dilated.   4. The mitral valve is grossly normal. No evidence of mitral valve  regurgitation.   5. The aortic valve is tricuspid. There is mild calcification of the  aortic valve. Aortic valve regurgitation is not visualized.   6. The inferior vena cava is normal in size with greater than 50%  respiratory variability, suggesting right atrial pressure of 3 mmHg.   Comparison(s): No significant change from prior study.   ASSESSMENT AND PLAN:  1.   Atrial fibrillation/flutter with RVR.  Intolerant of flecainide .  s/p PPM placement for marked post conversion pauses and bradycardia.  Has been on  propafenone  with very low Afib  burden.  Followed by EP.  Only one recurrence of aFib requiring DCCV. Continue propafenone  and anticoagulation. Afib  burden is still quite low.   2. HTN well controlled.  3. Mild hypercholesterolemia.     Signed, Lavone Barrientes, MD,FACC  01/09/2025 3:31 PM    [facility] [address], [place], [place], [address] Phone [phone], Fax [phone] "

## 2025-01-09 ENCOUNTER — Ambulatory Visit: Admitting: Cardiology

## 2025-01-09 ENCOUNTER — Encounter: Payer: Self-pay | Admitting: Cardiology

## 2025-01-09 VITALS — BP 140/80 | HR 66 | Ht 63.0 in | Wt 177.6 lb

## 2025-01-09 DIAGNOSIS — I495 Sick sinus syndrome: Secondary | ICD-10-CM

## 2025-01-09 DIAGNOSIS — Z950 Presence of cardiac pacemaker: Secondary | ICD-10-CM | POA: Diagnosis not present

## 2025-01-09 DIAGNOSIS — I10 Essential (primary) hypertension: Secondary | ICD-10-CM | POA: Diagnosis not present

## 2025-01-09 DIAGNOSIS — I480 Paroxysmal atrial fibrillation: Secondary | ICD-10-CM

## 2025-01-09 NOTE — Patient Instructions (Addendum)

## 2025-01-16 ENCOUNTER — Other Ambulatory Visit: Payer: Self-pay

## 2025-01-16 ENCOUNTER — Encounter (HOSPITAL_BASED_OUTPATIENT_CLINIC_OR_DEPARTMENT_OTHER): Payer: Self-pay

## 2025-01-16 ENCOUNTER — Emergency Department (HOSPITAL_BASED_OUTPATIENT_CLINIC_OR_DEPARTMENT_OTHER)

## 2025-01-16 ENCOUNTER — Emergency Department (HOSPITAL_BASED_OUTPATIENT_CLINIC_OR_DEPARTMENT_OTHER)
Admission: EM | Admit: 2025-01-16 | Discharge: 2025-01-16 | Disposition: A | Discharge status: Home / Self Care | Attending: Emergency Medicine | Admitting: Emergency Medicine

## 2025-01-16 DIAGNOSIS — S0990XA Unspecified injury of head, initial encounter: Secondary | ICD-10-CM | POA: Diagnosis not present

## 2025-01-16 DIAGNOSIS — Z7901 Long term (current) use of anticoagulants: Secondary | ICD-10-CM | POA: Insufficient documentation

## 2025-01-16 DIAGNOSIS — S6992XA Unspecified injury of left wrist, hand and finger(s), initial encounter: Secondary | ICD-10-CM | POA: Diagnosis present

## 2025-01-16 DIAGNOSIS — I10 Essential (primary) hypertension: Secondary | ICD-10-CM | POA: Diagnosis not present

## 2025-01-16 DIAGNOSIS — Y92000 Kitchen of unspecified non-institutional (private) residence as  the place of occurrence of the external cause: Secondary | ICD-10-CM | POA: Diagnosis not present

## 2025-01-16 DIAGNOSIS — R079 Chest pain, unspecified: Secondary | ICD-10-CM | POA: Diagnosis present

## 2025-01-16 DIAGNOSIS — Z79899 Other long term (current) drug therapy: Secondary | ICD-10-CM | POA: Diagnosis not present

## 2025-01-16 DIAGNOSIS — S52502A Unspecified fracture of the lower end of left radius, initial encounter for closed fracture: Secondary | ICD-10-CM | POA: Diagnosis not present

## 2025-01-16 DIAGNOSIS — W19XXXA Unspecified fall, initial encounter: Secondary | ICD-10-CM | POA: Insufficient documentation

## 2025-01-16 DIAGNOSIS — M25532 Pain in left wrist: Secondary | ICD-10-CM | POA: Diagnosis present

## 2025-01-16 LAB — CBC WITH DIFFERENTIAL/PLATELET
Abs Immature Granulocytes: 0.06 K/uL (ref 0.00–0.07)
Basophils Absolute: 0 K/uL (ref 0.0–0.1)
Basophils Relative: 0 %
Eosinophils Absolute: 0.1 K/uL (ref 0.0–0.5)
Eosinophils Relative: 1 %
HCT: 39.8 % (ref 36.0–46.0)
Hemoglobin: 13.1 g/dL (ref 12.0–15.0)
Immature Granulocytes: 1 %
Lymphocytes Relative: 16 %
Lymphs Abs: 1.9 K/uL (ref 0.7–4.0)
MCH: 30 pg (ref 26.0–34.0)
MCHC: 32.9 g/dL (ref 30.0–36.0)
MCV: 91.3 fL (ref 80.0–100.0)
Monocytes Absolute: 0.8 K/uL (ref 0.1–1.0)
Monocytes Relative: 7 %
Neutro Abs: 8.7 K/uL — ABNORMAL HIGH (ref 1.7–7.7)
Neutrophils Relative %: 75 %
Platelets: 193 K/uL (ref 150–400)
RBC: 4.36 MIL/uL (ref 3.87–5.11)
RDW: 13 % (ref 11.5–15.5)
WBC: 11.6 K/uL — ABNORMAL HIGH (ref 4.0–10.5)
nRBC: 0 % (ref 0.0–0.2)

## 2025-01-16 LAB — URINALYSIS, W/ REFLEX TO CULTURE (INFECTION SUSPECTED)
Bilirubin Urine: NEGATIVE
Glucose, UA: NEGATIVE mg/dL
Hgb urine dipstick: NEGATIVE
Ketones, ur: NEGATIVE mg/dL
Leukocytes,Ua: NEGATIVE
Nitrite: NEGATIVE
Protein, ur: NEGATIVE mg/dL
Specific Gravity, Urine: 1.02 (ref 1.005–1.030)
pH: 5.5 (ref 5.0–8.0)

## 2025-01-16 LAB — BASIC METABOLIC PANEL WITH GFR
Anion gap: 10 (ref 5–15)
BUN: 17 mg/dL (ref 8–23)
CO2: 29 mmol/L (ref 22–32)
Calcium: 9.5 mg/dL (ref 8.9–10.3)
Chloride: 101 mmol/L (ref 98–111)
Creatinine, Ser: 0.8 mg/dL (ref 0.44–1.00)
GFR, Estimated: >60 mL/min
Glucose, Bld: 98 mg/dL (ref 70–99)
Potassium: 3.4 mmol/L — ABNORMAL LOW (ref 3.5–5.1)
Sodium: 139 mmol/L (ref 135–145)

## 2025-01-16 LAB — TROPONIN T, HIGH SENSITIVITY: Troponin T High Sensitivity: 15 ng/L (ref 0–19)

## 2025-01-16 MED ORDER — ACETAMINOPHEN 500 MG PO TABS
1000.0000 mg | ORAL_TABLET | Freq: Once | ORAL | Status: AC
  Administered 2025-01-16: 1000 mg via ORAL
  Filled 2025-01-16: qty 2

## 2025-01-16 MED ORDER — TRAMADOL HCL 50 MG PO TABS: 50.0000 mg | ORAL_TABLET | Freq: Four times a day (QID) | ORAL | 0 refills | Status: DC | PRN

## 2025-01-16 MED ORDER — TRAMADOL HCL 50 MG PO TABS
50.0000 mg | ORAL_TABLET | Freq: Once | ORAL | Status: AC
  Administered 2025-01-16: 50 mg via ORAL
  Filled 2025-01-16: qty 1

## 2025-01-16 NOTE — ED Triage Notes (Signed)
 Pt presents via POV c/o fall at home. Reports dizziness prior to fall. Reports take Eliquis . Reports high head. Reports left wrist and hand pain. A&O x4.

## 2025-01-16 NOTE — ED Provider Notes (Signed)
 " [place] [facility] Provider Note   CSN: [id number] Arrival date & time: 01/16/25  1640     Patient presents with: [place]   [name] is a 89 y.o. female.   Patient is a 80 year old who presents after a fall.  She said that she was turning in the kitchen and fell.  She does not know what made her fall.  She did not get dizzy or lightheaded.  She denies any chest pain or palpitations.  No shortness of breath.  She said when she was trying to get up she had very brief twinges of dizziness but no dizziness prior to the incident.  She complains of pain to her left wrist.  She does not know if she hit her head.  She is on Eliquis .  She denies any headache.  No neck or back pain.  No chest or abdominal pain.  No urinary symptoms.  No fevers or other recent illnesses.       Prior to Admission medications  Medication Sig Start Date End Date Taking? Authorizing Provider  traMADol  (ULTRAM ) 50 MG tablet Take 1 tablet (50 mg total) by mouth every 6 (six) hours as needed. 01/16/25  Yes Lenor Hollering, MD  acetaminophen  (TYLENOL ) 500 MG tablet Take 500-1,000 mg by mouth every 6 (six) hours as needed for moderate pain.    [provider]  antiseptic oral rinse (BIOTENE) LIQD 15 mLs by Mouth Rinse route 2 (two) times daily.    [provider]  apixaban  (ELIQUIS ) 5 MG TABS tablet TAKE 1 TABLET BY MOUTH TWICE A DAY 11/10/24   Jordan, Peter M, MD  Artificial Saliva (ACT DRY MOUTH) LOZG Use as directed 1 drop in the mouth or throat as needed (dry mouth).    [provider]  bimatoprost  (LUMIGAN ) 0.01 % SOLN Place 1 drop into both eyes at bedtime.     [provider]  Calcium  Citrate-Vitamin D  (CALCIUM  + D PO) Take 1 tablet by mouth in the morning.    [provider]  Cholecalciferol  (VITAMIN D3 SUPER STRENGTH) 50 MCG (2000 UT) TABS Take 2,000 Units by mouth daily.    [provider]  cyanocobalamin (VITAMIN B12)  1000 MCG tablet Take 1,000 mcg by mouth in the morning.    [provider]  Dentifrices (BIOTENE DRY MOUTH GENTLE) PSTE Place 1 Application onto teeth in the morning and at bedtime.    [provider]  Docusate Calcium  (STOOL SOFTENER PO) Take 100 mg by mouth 3 (three) times a week. At night    [provider]  dorzolamide  (TRUSOPT ) 2 % ophthalmic solution Place 1 drop into both eyes 3 (three) times daily.    [provider]  esomeprazole  (NEXIUM ) 20 MG capsule TAKE 1 CAPSULE (20 MG TOTAL) BY MOUTH AS NEEDED 01/14/24   Eubanks, Jessica K, NP  ezetimibe  (ZETIA ) 10 MG tablet TAKE ONE TABLET BY MOUTH ONCE DAILY 10/06/24   Eubanks, Jessica K, NP  Fexofenadine HCl (ALLEGRA ALLERGY PO) Take by mouth.    [provider]  hydroquinone  4 % cream Apply topically daily. 07/04/24   Eubanks, Jessica K, NP  Magnesium  Oxide (MAGNESIUM  EXTRA STRENGTH PO) Apply 1 Application topically as needed (knee pain.). MAGNESIUM  OIL    [provider]  methylcellulose (ARTIFICIAL TEARS) 1 % ophthalmic solution Place 1 drop into both eyes 3 (three) times daily as needed (for dry eyes).    [provider]  metoprolol  tartrate (LOPRESSOR )  25 MG tablet Take 1 tablet (25 mg total) by mouth 2 (two) times daily. 06/04/24   Leverne Charlies Helling, PA-C  propafenone  (RYTHMOL  SR) 225 MG 12 hr capsule TAKE 1 CAPSULE BY MOUTH TWICE A DAY 07/04/24   Jordan, Peter M, MD  sodium chloride  (OCEAN) 0.65 % SOLN nasal spray Place 1 spray into both nostrils 2 (two) times daily as needed (dry nasal passages). [employer] Saline Nasal Moisturizing Spray    [provider]  traZODone  (DESYREL ) 50 MG tablet TAKE ONE-HALF TO 1 TABLET BY MOUTH AT BEDTIME AS NEEDED 07/16/24   Eubanks, Jessica K, NP  valsartan -hydrochlorothiazide  (DIOVAN -HCT) 320-25 MG tablet TAKE ONE TABLET BY MOUTH DAILY 10/06/24   Eubanks, Jessica K, NP    Allergies: Advil [ibuprofen], Aleve [naproxen sodium], Aspirin , Atorvastatin ,  Brimonidine, Celebrex [celecoxib], Diclofenac, Doxycycline , Nsaids, Other, Pravastatin , Timolol, Timolol maleate, and Metoclopramide  hcl    Review of Systems  Constitutional:  Negative for chills, diaphoresis, fatigue and fever.  HENT:  Negative for congestion, rhinorrhea and sneezing.   Eyes: Negative.   Respiratory:  Negative for cough, chest tightness and shortness of breath.   Cardiovascular:  Negative for chest pain and leg swelling.  Gastrointestinal:  Negative for abdominal pain, diarrhea, nausea and vomiting.  Genitourinary:  Negative for difficulty urinating, flank pain and frequency.  Musculoskeletal:  Positive for arthralgias. Negative for back pain and neck pain.  Skin:  Negative for rash and wound.  Neurological:  Negative for dizziness, speech difficulty, weakness, numbness and headaches.    Updated Vital Signs BP 128/61   Pulse 63   Temp 98 F (36.7 C)   Resp 20   SpO2 97%   Physical Exam Constitutional:      Appearance: She is well-developed.  HENT:     Head: Normocephalic and atraumatic.  Eyes:     Pupils: Pupils are equal, round, and reactive to light.  Neck:     Comments: No pain of the cervical, thoracic or lumbosacral spine Cardiovascular:     Rate and Rhythm: Normal rate and regular rhythm.     Heart sounds: Normal heart sounds.  Pulmonary:     Effort: Pulmonary effort is normal. No respiratory distress.     Breath sounds: Normal breath sounds. No wheezing or rales.  Chest:     Chest wall: No tenderness.  Abdominal:     General: Bowel sounds are normal.     Palpations: Abdomen is soft.     Tenderness: There is no abdominal tenderness. There is no guarding or rebound.  Musculoskeletal:        General: Normal range of motion.     Cervical back: Normal range of motion and neck supple.     Comments: Positive swelling and ecchymosis over the distal radius of the left arm.  She has tenderness to this area.  She is able to wiggle the fingers.  She has  normal sensation distally.  Radial pulses intact.  No pain to the elbow or shoulder.  No wounds.  No pain on palpation or range of motion of the other extremities.  Lymphadenopathy:     Cervical: No cervical adenopathy.  Skin:    General: Skin is warm and dry.     Findings: No rash.  Neurological:     Mental Status: She is alert and oriented to person, place, and time.     (all labs ordered are listed, but only abnormal results are displayed) Labs Reviewed  CBC WITH DIFFERENTIAL/PLATELET - Abnormal; Notable for the  following components:      Result Value   WBC 11.6 (*)    Neutro Abs 8.7 (*)    All other components within normal limits  BASIC METABOLIC PANEL WITH GFR - Abnormal; Notable for the following components:   Potassium 3.4 (*)    All other components within normal limits  URINALYSIS, W/ REFLEX TO CULTURE (INFECTION SUSPECTED) - Abnormal; Notable for the following components:   Bacteria, UA RARE (*)    All other components within normal limits  TROPONIN T, HIGH SENSITIVITY    EKG: None  Radiology: CT Cervical Spine Wo Contrast Result Date: 01/16/2025 EXAM: CT CERVICAL SPINE WITHOUT CONTRAST 01/16/2025 06:34:00 PM TECHNIQUE: CT of the cervical spine was performed without the administration of intravenous contrast. Multiplanar reformatted images are provided for review. Automated exposure control, iterative reconstruction, and/or weight based adjustment of the mA/kV was utilized to reduce the radiation dose to as low as reasonably achievable. COMPARISON: None available. CLINICAL HISTORY: Neck trauma (Age >= 65y). Neck trauma. Age greater than or equal to 65 years. FINDINGS: BONES AND ALIGNMENT: Cervical lordosis is maintained. Trace degenerative anterolisthesis of C2 on C3 and of C7 on T1. There is no evidence of traumatic malalignment. No acute fracture in the cervical spine. DEGENERATIVE CHANGES: There is disc space narrowing most pronounced at C5-C6. Vacuum disc phenomenon at  the C4-C5 and C5-C6 levels. Facet arthrosis and uncovertebral hypertrophy at multiple levels. Foraminal stenosis is most pronounced at C5-C6. There is no high grade osseous spinal canal stenosis in the cervical spine. SOFT TISSUES: No prevertebral soft tissue swelling. Small effusion in the right mastoid tip. VASCULATURE: Atherosclerosis at the carotid bifurcations. IMPRESSION: 1. No acute fracture or traumatic malalignment in the cervical spine. Electronically signed by: Donnice Mania MD 01/16/2025 06:51 PM EST RP Workstation: [id number]   DG Hand Complete Left Result Date: 01/16/2025 EXAM: 3 OR MORE VIEW(S) XRAY OF THE LEFT HAND 01/16/2025 05:41:00 PM COMPARISON: None available. CLINICAL HISTORY: Pain. FINDINGS: BONES AND JOINTS: Impacted intra-articular distal radius fracture. Degenerative changes of the interphalangeal joints. SOFT TISSUES: Soft tissue swelling at the wrist. IMPRESSION: 1. Impacted intra-articular distal radius fracture. 2. Soft tissue swelling at the wrist. Electronically signed by: Oneil Devonshire MD 01/16/2025 05:51 PM EST RP Workstation: [id number]   CT Head Wo Contrast Result Date: 01/16/2025 EXAM: CT HEAD WITHOUT CONTRAST 01/16/2025 05:42:10 PM TECHNIQUE: CT of the head was performed without the administration of intravenous contrast. Automated exposure control, iterative reconstruction, and/or weight based adjustment of the mA/kV was utilized to reduce the radiation dose to as low as reasonably achievable. COMPARISON: None available. CLINICAL HISTORY: Fall while taking anticoagulant medication. FINDINGS: BRAIN AND VENTRICLES: No acute hemorrhage. No evidence of acute infarct. No extra-axial collection. No mass effect or midline shift. Mild atrophic changes are noted. ORBITS: Bilateral lens replacement. SINUSES: No acute abnormality. SOFT TISSUES AND SKULL: No acute soft tissue abnormality. No skull fracture. IMPRESSION: 1. No acute intracranial abnormality. Electronically signed by: Oneil Devonshire MD 01/16/2025 05:51 PM EST RP Workstation: [id number]   DG Wrist Complete Left Result Date: 01/16/2025 EXAM: 3 OR MORE VIEW(S) XRAY OF THE LEFT WRIST 01/16/2025 05:41:00 PM COMPARISON: None available. CLINICAL HISTORY: Pain. FINDINGS: BONES AND JOINTS: Acute comminuted intra-articular fracture of the distal radius. Impacted radial fracture. Mildly displaced fracture of the ulnar styloid. No malalignment. SOFT TISSUES: Soft tissue swelling of the wrist. IMPRESSION: 1. Acute comminuted intra-articular fracture of the distal radius with impacted radial fracture. 2. Mildly displaced fracture of the ulnar  styloid. 3. Soft tissue swelling of the wrist. Electronically signed by: Oneil Devonshire MD 01/16/2025 05:49 PM EST RP Workstation: [id number]     Procedures   Medications Ordered in the ED  acetaminophen  (TYLENOL ) tablet 1,000 mg (1,000 mg Oral Given 01/16/25 1736)  traMADol  (ULTRAM ) tablet 50 mg (50 mg Oral Given 01/16/25 1949)                                    Medical Decision Making Amount and/or Complexity of Data Reviewed Labs: ordered. Radiology: ordered.  Risk OTC drugs. Prescription drug management.   This patient presents to the ED for concern of fall, wrist pain, this involves an extensive number of treatment options, and is a complaint that carries with it a high risk of complications and morbidity.  I considered the following differential and admission for this acute, potentially life threatening condition.  The differential diagnosis includes mechanical fall, syncope, seizure, vasovagal episode, arrhythmia, head injury, fracture  MDM:    Patient is a 89 year old who presents after a fall.  It sounds like it was a mechanical fall but she does not know exactly what made her fall.  She did not have any symptoms preceding the fall although she had some minor dizziness just after the incident.  She is not sure if she hit her head.  She has injury to her left wrist.  X-rays of  the wrist reveal distal radius fracture and ulnar styloid fracture.  She was placed in thumb spica.  She was given a sling to use.  She is neurovascularly intact.  She had a CT scan of her head and cervical spine which do not show any acute traumatic injuries.  Labs are nonconcerning.  Urine is not concerning for infection.  Her vital signs are stable.  She was discharged home in good condition she says that she is taking tramadol  in the past and has done well with it and requested prescription for tramadol  to help manage the pain.  She was given a prescription for this.  She was advised to follow-up with a hand surgeon for definitive care.  She was advised to call Monday to make an appointment.  She has seen Dr. Vernetta in the past and was going to call him first to see if he would be okay managing it but otherwise will follow-up with a hand surgeon.  Return precautions were given.  (Labs, imaging, consults)  Labs: I Ordered, and personally interpreted labs.  The pertinent results include: No significant anemia, urine not concerning for infection, otherwise negative  Imaging Studies ordered: I ordered imaging studies including CT head, CT cervical spine, left wrist I independently visualized and interpreted imaging. I agree with the radiologist interpretation  Additional history obtained from family member at bedside.  External records from outside source obtained and reviewed including prior notes  Cardiac Monitoring: The patient was not maintained on a cardiac monitor.  If on the cardiac monitor, I personally viewed and interpreted the cardiac monitored which showed an underlying rhythm of:    Reevaluation: After the interventions noted above, I reevaluated the patient and found that they have :improved  Social Determinants of Health:    Disposition: Discharged to home  Co morbidities that complicate the patient evaluation  Past Medical History:  Diagnosis Date   A-fib (HCC)     Allergic rhinitis due to pollen    Arthritis    right  knee (06/21/2016)   Asymptomatic varicose veins    Carpal tunnel syndrome    Chronic lower back pain    on the left side (06/21/2016)   Constipation    Depressive disorder, not elsewhere classified    pt denies   Diaphragmatic hernia without mention of obstruction or gangrene    Diverticulosis of colon (without mention of hemorrhage)    Enthesopathy of hip region    Esophageal reflux    , just occasional   History of blood transfusion    w/both knee replacements   History of duodenal ulcer    History of kidney stones    passed   Insomnia, unspecified    Migraine    none since ~ 1990 (06/21/2016)   Myalgia and myositis, unspecified    Obesity, unspecified    Pain in joint, ankle and foot    Pain in joint, hand    Pain in joint, lower leg    Pain in joint, pelvic region and thigh    Plantar fascial fibromatosis    Presence of permanent cardiac pacemaker    -[employer]   Reflux esophagitis    Sciatica    Spinal stenosis, unspecified region other than cervical    Stricture and stenosis of esophagus    Unspecified essential hypertension    Unspecified vitamin D  deficiency      Medicines Meds ordered this encounter  Medications   acetaminophen  (TYLENOL ) tablet 1,000 mg   traMADol  (ULTRAM ) tablet 50 mg   traMADol  (ULTRAM ) 50 MG tablet    Sig: Take 1 tablet (50 mg total) by mouth every 6 (six) hours as needed.    Dispense:  15 tablet    Refill:  0    I have reviewed the patients home medicines and have made adjustments as needed  Problem List / ED Course: Problem List Items Addressed This Visit   None Visit Diagnoses       Fall, initial encounter    -  Primary     Closed fracture of distal end of left radius, unspecified fracture morphology, initial encounter         Injury of head, initial encounter                    Final diagnoses:  Fall, initial encounter  Closed fracture of distal end of  left radius, unspecified fracture morphology, initial encounter  Injury of head, initial encounter    ED Discharge Orders          Ordered    traMADol  (ULTRAM ) 50 MG tablet  Every 6 hours PRN        01/16/25 1949               Lenor Hollering, MD 01/16/25 1953  "

## 2025-01-16 NOTE — ED Notes (Signed)
"   Computer system wouldn't let this tech charge ace wraps and fiberglass to pt. 2 ace wraps and 1 fiberglass.   "

## 2025-01-16 NOTE — ED Notes (Signed)
 Patient transported to CT

## 2025-01-16 NOTE — Discharge Instructions (Addendum)
 Make an appointment to have follow-up next week with the hand surgeon.  Return to the emergency room if you have any worsening symptoms.

## 2025-01-18 ENCOUNTER — Encounter: Payer: Self-pay | Admitting: Orthopaedic Surgery

## 2025-01-21 ENCOUNTER — Ambulatory Visit: Admitting: Physician Assistant

## 2025-01-21 DIAGNOSIS — S62102A Fracture of unspecified carpal bone, left wrist, initial encounter for closed fracture: Secondary | ICD-10-CM | POA: Diagnosis not present

## 2025-01-21 MED ORDER — TRAMADOL HCL 50 MG PO TABS: 50.0000 mg | ORAL_TABLET | Freq: Four times a day (QID) | ORAL | 0 refills | Status: AC | PRN

## 2025-01-21 NOTE — Progress Notes (Signed)
 HPI: Mrs. Rota comes in today status post fall 01/16/2025.  She was seen at [facility] and was found to have a left wrist fracture.  Radiographs were reviewed of the left wrist and show comminuted intra-articular fracture with impaction of the distal radius.  Mildly displaced fracture of the ulnar styloid.  She is placed in wrist splint.  She has had some discomfort with this despite using a sling.  Left wrist: No gross deformity.  No significant swelling no ecchymosis or impending ulcers.  Hands neurovascularly intact.  Tenderness over the distal radius and ulna.  Compartments throughout the forearm remain supple.  Impression: Left wrist fracture (closed)  Plan: We placed her in a newly fashioned volar splint.  She will keep this clean dry and intact.  Elevation of the hand and range of motion of the fingers encouraged.  We did send her in some tramadol  for pain.  Will see her back in just 2 weeks and obtain 3 views of the left wrist.  She would like to stay as conservative as possible and avoid surgery.  Given the minimal displacement of the distal radius she should do well with just conservative treatment.  Questions were encouraged and answered at length.

## 2025-02-05 ENCOUNTER — Ambulatory Visit: Admitting: Physician Assistant

## 2025-02-16 ENCOUNTER — Ambulatory Visit: Payer: Self-pay | Admitting: Nurse Practitioner

## 2025-02-17 ENCOUNTER — Encounter: Payer: Medicare HMO | Admitting: Nurse Practitioner

## 2025-09-15 ENCOUNTER — Ambulatory Visit

## 2025-12-15 ENCOUNTER — Ambulatory Visit

## 2026-03-16 ENCOUNTER — Ambulatory Visit

## 2026-06-15 ENCOUNTER — Ambulatory Visit
